# Patient Record
Sex: Female | Born: 1945 | Race: White | Hispanic: No | Marital: Married | Smoking: Never smoker
Health system: Southern US, Community
[De-identification: ages and names within clinical notes are randomized; demographics above are authoritative.]

## PROBLEM LIST (undated history)

## (undated) DIAGNOSIS — E0590 Thyrotoxicosis, unspecified without thyrotoxic crisis or storm: Secondary | ICD-10-CM

## (undated) DIAGNOSIS — Z8719 Personal history of other diseases of the digestive system: Secondary | ICD-10-CM

## (undated) DIAGNOSIS — F419 Anxiety disorder, unspecified: Secondary | ICD-10-CM

## (undated) DIAGNOSIS — I10 Essential (primary) hypertension: Secondary | ICD-10-CM

## (undated) DIAGNOSIS — C50919 Malignant neoplasm of unspecified site of unspecified female breast: Secondary | ICD-10-CM

## (undated) DIAGNOSIS — J40 Bronchitis, not specified as acute or chronic: Secondary | ICD-10-CM

## (undated) DIAGNOSIS — K219 Gastro-esophageal reflux disease without esophagitis: Secondary | ICD-10-CM

## (undated) DIAGNOSIS — J45909 Unspecified asthma, uncomplicated: Secondary | ICD-10-CM

## (undated) HISTORY — DX: Essential (primary) hypertension: I10

## (undated) HISTORY — PX: INCONTINENCE SURGERY: SHX676

## (undated) HISTORY — PX: OTHER SURGICAL HISTORY: SHX169

## (undated) HISTORY — DX: Malignant neoplasm of unspecified site of unspecified female breast: C50.919

## (undated) HISTORY — PX: TONSILLECTOMY: SUR1361

---

## 1994-10-14 HISTORY — PX: BREAST EXCISIONAL BIOPSY: SUR124

## 1998-06-15 ENCOUNTER — Ambulatory Visit (HOSPITAL_BASED_OUTPATIENT_CLINIC_OR_DEPARTMENT_OTHER): Admission: RE | Admit: 1998-06-15 | Discharge: 1998-06-15 | Payer: Self-pay | Admitting: Orthopedic Surgery

## 1999-08-10 ENCOUNTER — Encounter: Admission: RE | Admit: 1999-08-10 | Discharge: 1999-08-10 | Payer: Self-pay | Admitting: Hematology and Oncology

## 1999-08-10 ENCOUNTER — Encounter: Payer: Self-pay | Admitting: Hematology and Oncology

## 1999-09-07 ENCOUNTER — Other Ambulatory Visit: Admission: RE | Admit: 1999-09-07 | Discharge: 1999-09-07 | Payer: Self-pay | Admitting: Internal Medicine

## 2000-03-31 ENCOUNTER — Encounter: Payer: Self-pay | Admitting: Hematology and Oncology

## 2000-03-31 ENCOUNTER — Encounter: Admission: RE | Admit: 2000-03-31 | Discharge: 2000-03-31 | Payer: Self-pay | Admitting: Hematology and Oncology

## 2000-09-01 ENCOUNTER — Other Ambulatory Visit: Admission: RE | Admit: 2000-09-01 | Discharge: 2000-09-01 | Payer: Self-pay | Admitting: Internal Medicine

## 2000-09-01 ENCOUNTER — Encounter: Payer: Self-pay | Admitting: Hematology and Oncology

## 2000-09-01 ENCOUNTER — Encounter: Admission: RE | Admit: 2000-09-01 | Discharge: 2000-09-01 | Payer: Self-pay | Admitting: Hematology and Oncology

## 2000-09-02 ENCOUNTER — Encounter: Admission: RE | Admit: 2000-09-02 | Discharge: 2000-09-02 | Payer: Self-pay | Admitting: Hematology and Oncology

## 2000-09-02 ENCOUNTER — Encounter: Payer: Self-pay | Admitting: Hematology and Oncology

## 2000-09-24 ENCOUNTER — Encounter (INDEPENDENT_AMBULATORY_CARE_PROVIDER_SITE_OTHER): Payer: Self-pay

## 2000-09-24 ENCOUNTER — Other Ambulatory Visit: Admission: RE | Admit: 2000-09-24 | Discharge: 2000-09-24 | Payer: Self-pay | Admitting: *Deleted

## 2001-04-09 ENCOUNTER — Encounter: Admission: RE | Admit: 2001-04-09 | Discharge: 2001-04-09 | Payer: Self-pay | Admitting: Hematology and Oncology

## 2001-04-09 ENCOUNTER — Encounter: Payer: Self-pay | Admitting: Hematology and Oncology

## 2001-09-03 ENCOUNTER — Other Ambulatory Visit: Admission: RE | Admit: 2001-09-03 | Discharge: 2001-09-03 | Payer: Self-pay | Admitting: Internal Medicine

## 2002-04-09 ENCOUNTER — Encounter: Admission: RE | Admit: 2002-04-09 | Discharge: 2002-04-09 | Payer: Self-pay | Admitting: Hematology and Oncology

## 2002-04-09 ENCOUNTER — Encounter: Payer: Self-pay | Admitting: Hematology and Oncology

## 2002-08-20 ENCOUNTER — Other Ambulatory Visit: Admission: RE | Admit: 2002-08-20 | Discharge: 2002-08-20 | Payer: Self-pay | Admitting: Internal Medicine

## 2002-09-07 ENCOUNTER — Encounter: Payer: Self-pay | Admitting: Internal Medicine

## 2002-09-07 ENCOUNTER — Encounter: Admission: RE | Admit: 2002-09-07 | Discharge: 2002-09-07 | Payer: Self-pay | Admitting: Internal Medicine

## 2003-08-30 ENCOUNTER — Encounter: Admission: RE | Admit: 2003-08-30 | Discharge: 2003-08-30 | Payer: Self-pay | Admitting: Oncology

## 2003-09-02 ENCOUNTER — Other Ambulatory Visit: Admission: RE | Admit: 2003-09-02 | Discharge: 2003-09-02 | Payer: Self-pay | Admitting: Internal Medicine

## 2004-09-17 ENCOUNTER — Other Ambulatory Visit: Admission: RE | Admit: 2004-09-17 | Discharge: 2004-09-17 | Payer: Self-pay | Admitting: Internal Medicine

## 2004-09-20 ENCOUNTER — Encounter: Admission: RE | Admit: 2004-09-20 | Discharge: 2004-09-20 | Payer: Self-pay | Admitting: Internal Medicine

## 2005-09-20 ENCOUNTER — Other Ambulatory Visit: Admission: RE | Admit: 2005-09-20 | Discharge: 2005-09-20 | Payer: Self-pay | Admitting: Internal Medicine

## 2005-10-29 ENCOUNTER — Encounter: Admission: RE | Admit: 2005-10-29 | Discharge: 2005-10-29 | Payer: Self-pay | Admitting: Internal Medicine

## 2006-09-19 ENCOUNTER — Other Ambulatory Visit: Admission: RE | Admit: 2006-09-19 | Discharge: 2006-09-19 | Payer: Self-pay | Admitting: Internal Medicine

## 2006-09-26 ENCOUNTER — Encounter: Admission: RE | Admit: 2006-09-26 | Discharge: 2006-09-26 | Payer: Self-pay | Admitting: Internal Medicine

## 2006-11-17 ENCOUNTER — Encounter: Admission: RE | Admit: 2006-11-17 | Discharge: 2006-11-17 | Payer: Self-pay | Admitting: Internal Medicine

## 2006-12-15 ENCOUNTER — Ambulatory Visit (HOSPITAL_BASED_OUTPATIENT_CLINIC_OR_DEPARTMENT_OTHER): Admission: RE | Admit: 2006-12-15 | Discharge: 2006-12-15 | Payer: Self-pay | Admitting: Urology

## 2007-09-24 ENCOUNTER — Other Ambulatory Visit: Admission: RE | Admit: 2007-09-24 | Discharge: 2007-09-24 | Payer: Self-pay | Admitting: Internal Medicine

## 2007-11-30 ENCOUNTER — Encounter: Admission: RE | Admit: 2007-11-30 | Discharge: 2007-11-30 | Payer: Self-pay | Admitting: Internal Medicine

## 2007-12-22 ENCOUNTER — Encounter: Admission: RE | Admit: 2007-12-22 | Discharge: 2007-12-22 | Payer: Self-pay | Admitting: Internal Medicine

## 2008-01-07 ENCOUNTER — Encounter: Admission: RE | Admit: 2008-01-07 | Discharge: 2008-01-07 | Payer: Self-pay | Admitting: Internal Medicine

## 2008-01-13 ENCOUNTER — Encounter: Admission: RE | Admit: 2008-01-13 | Discharge: 2008-01-13 | Payer: Self-pay | Admitting: Internal Medicine

## 2008-02-22 ENCOUNTER — Encounter: Admission: RE | Admit: 2008-02-22 | Discharge: 2008-02-22 | Payer: Self-pay | Admitting: Internal Medicine

## 2008-03-19 ENCOUNTER — Encounter: Admission: RE | Admit: 2008-03-19 | Discharge: 2008-03-19 | Payer: Self-pay | Admitting: Neurosurgery

## 2008-03-30 ENCOUNTER — Encounter: Admission: RE | Admit: 2008-03-30 | Discharge: 2008-03-30 | Payer: Self-pay | Admitting: Neurosurgery

## 2008-07-21 ENCOUNTER — Encounter: Admission: RE | Admit: 2008-07-21 | Discharge: 2008-07-21 | Payer: Self-pay | Admitting: Internal Medicine

## 2008-09-22 ENCOUNTER — Ambulatory Visit: Payer: Self-pay | Admitting: Internal Medicine

## 2008-10-11 ENCOUNTER — Other Ambulatory Visit: Admission: RE | Admit: 2008-10-11 | Discharge: 2008-10-11 | Payer: Self-pay | Admitting: Internal Medicine

## 2008-10-11 ENCOUNTER — Ambulatory Visit: Payer: Self-pay | Admitting: Internal Medicine

## 2008-11-08 ENCOUNTER — Ambulatory Visit: Payer: Self-pay | Admitting: Internal Medicine

## 2008-12-05 ENCOUNTER — Encounter: Admission: RE | Admit: 2008-12-05 | Discharge: 2008-12-05 | Payer: Self-pay | Admitting: Internal Medicine

## 2009-01-05 ENCOUNTER — Ambulatory Visit: Payer: Self-pay | Admitting: Internal Medicine

## 2009-03-16 ENCOUNTER — Ambulatory Visit (HOSPITAL_COMMUNITY): Admission: RE | Admit: 2009-03-16 | Discharge: 2009-03-17 | Payer: Self-pay | Admitting: Neurosurgery

## 2009-03-23 ENCOUNTER — Ambulatory Visit: Payer: Self-pay | Admitting: Internal Medicine

## 2009-10-12 ENCOUNTER — Ambulatory Visit: Payer: Self-pay | Admitting: Internal Medicine

## 2009-10-20 ENCOUNTER — Ambulatory Visit: Payer: Self-pay | Admitting: Internal Medicine

## 2010-01-23 ENCOUNTER — Emergency Department (HOSPITAL_COMMUNITY): Admission: EM | Admit: 2010-01-23 | Discharge: 2010-01-23 | Payer: Self-pay | Admitting: Family Medicine

## 2010-02-15 ENCOUNTER — Emergency Department (HOSPITAL_COMMUNITY): Admission: EM | Admit: 2010-02-15 | Discharge: 2010-02-15 | Payer: Self-pay | Admitting: Family Medicine

## 2010-11-04 ENCOUNTER — Encounter: Payer: Self-pay | Admitting: Internal Medicine

## 2010-12-25 ENCOUNTER — Other Ambulatory Visit: Payer: Self-pay | Admitting: Chiropractic Medicine

## 2010-12-25 ENCOUNTER — Ambulatory Visit
Admission: RE | Admit: 2010-12-25 | Discharge: 2010-12-25 | Disposition: A | Discharge status: Home / Self Care | Payer: Self-pay | Source: Ambulatory Visit | Attending: Chiropractic Medicine | Admitting: Chiropractic Medicine

## 2010-12-25 DIAGNOSIS — M5412 Radiculopathy, cervical region: Secondary | ICD-10-CM

## 2010-12-25 DIAGNOSIS — M545 Low back pain: Secondary | ICD-10-CM

## 2011-01-21 LAB — BASIC METABOLIC PANEL
BUN: 14 mg/dL (ref 6–23)
CO2: 26 mEq/L (ref 19–32)
Calcium: 9.2 mg/dL (ref 8.4–10.5)
Chloride: 106 mEq/L (ref 96–112)
Creatinine, Ser: 0.98 mg/dL (ref 0.4–1.2)
GFR calc Af Amer: >60 mL/min (ref >60)
GFR calc non Af Amer: 58 mL/min — ABNORMAL LOW (ref >60)
Glucose, Bld: 100 mg/dL — ABNORMAL HIGH (ref 70–99)
Potassium: 4.1 mEq/L (ref 3.5–5.1)
Sodium: 140 mEq/L (ref 135–145)

## 2011-01-21 LAB — CBC
MCHC: 34.4 g/dL (ref 30.0–36.0)
Platelets: 258 10*3/uL (ref 150–400)
RDW: 13.6 % (ref 11.5–15.5)

## 2011-01-25 ENCOUNTER — Ambulatory Visit (INDEPENDENT_AMBULATORY_CARE_PROVIDER_SITE_OTHER): Payer: 59 | Admitting: Internal Medicine

## 2011-01-25 DIAGNOSIS — J309 Allergic rhinitis, unspecified: Secondary | ICD-10-CM

## 2011-02-26 NOTE — Op Note (Signed)
NAME:  Tara Rodgers, Tara Rodgers NO.:  [id number]   MEDICAL RECORD NO.:  [id number]          PATIENT TYPE:  OIB   LOCATION:  3599                         FACILITY:  [facility]   PHYSICIAN:  Danae Orleans. Venetia Maxon, M.D.  DATE OF BIRTH:  1946-06-16   DATE OF PROCEDURE:  03/16/2009  DATE OF DISCHARGE:                               OPERATIVE REPORT   PREOPERATIVE DIAGNOSIS:  Left ulnar neuropathy and left carpal tunnel  syndrome.   POSTOPERATIVE DIAGNOSIS:  Left ulnar neuropathy and left carpal tunnel  syndrome.   PROCEDURE:  Left ulnar nerve decompression and left carpal tunnel  release.   SURGEON:  Danae Orleans. Venetia Maxon, MD   ANESTHESIA:  Local anesthetic with laryngeal mask airway general  anesthesia.   COMPLICATIONS:  None.   ESTIMATED BLOOD LOSS:  Minimal.   DISPOSITION:  Recovery.   INDICATIONS:  Tara Rodgers is a 65 year old woman with left arm  weakness and pain with EMG nerve conduction velocity documented as left  ulnar neuropathy as well as left carpal tunnel syndrome.  It was elected  to take her to surgery for left ulnar nerve decompression and left  carpal tunnel release.   PROCEDURE IN DETAIL:  Ms. Beauchesne was brought to the operating room.  Following satisfactory and uncomplicated induction of general anesthesia  with laryngeal mask airway, the patient was placed in a supine position  on the operating table with arms placed on the arm board.  Her left arm  was then prepped and draped with Betadine scrub and paint and sterile  stockinette.  Areas of planned incision were marked and infiltrated with  local lidocaine.  A small curvilinear incision based about the elbow and  the medial surface was then made and carried through subcutaneous fat  overlying the medial condyle.  Using sharp dissection, the ulnar nerve  was encountered at the medial epicondyle and it was decompressed as it  extended more distally between the heads of the flexor carpi ulnaris as  well as  more proximally along the intermuscular septum.  Both areas were  decompressed.  There was a thick band of tissue with deformation of the  shape of the nerve just at the more distal edge of the medial epicondyle  and after cutting this band of tissue, the nerve appeared to be  significantly decompressed.  There was no evidence of subluxation of the  nerve.  The wound was irrigated.  Soft tissues were inspected and found  to be in good repair.  The subcutaneous tissues were approximated with 2-  0 Vicryl sutures and skin edges were approximated with interrupted 3-0  Vicryl subcuticular stitch.  Wound was dressed with benzoin, Steri-  Strips, Telfa gauze, and fluff Kerlix and Kerlix and Kling wrap.  This  was done after the left carpal tunnel was released.  This area was  infiltrated with local lidocaine.  Incision was made from the distal  wrist crease into the volar palm of a distance of about 2-3 cm and  carried sharply through flexor retinaculum with decompression of the  carpal tunnel distally into the palm as well  as more proximally into the  volar forearm.  There did not appear to be residual compression at this  point.  The wound  was irrigated and closed with interrupted 3-0 nylon vertical mattress  sutures.  The wound was dressed with bacitracin, Telfa, Kerlix fluff,  and wrap.  The patient was x-rayed in the operating room and taken to  recovery in stable and satisfactory condition having tolerated the  operation well.  Counts were correct at the end of the case.      Danae Orleans. Venetia Maxon, M.D.  Electronically Signed     JDS/MEDQ  D:  03/16/2009  T:  03/17/2009  Job:  [id number]

## 2011-03-01 NOTE — Op Note (Signed)
NAME:  Tara Rodgers, Tara Rodgers               ACCOUNT NO.:  [id number]   MEDICAL RECORD NO.:  [id number]          PATIENT TYPE:  AMB   LOCATION:  [facility]                         FACILITY:  [facility]   PHYSICIAN:  Mark C. Vernie Ammons, M.D.  DATE OF BIRTH:  [birth year]   DATE OF PROCEDURE:  12/15/2006  DATE OF DISCHARGE:                               OPERATIVE REPORT   PREOPERATIVE DIAGNOSIS:  Stress urinary incontinence.   POSTOPERATIVE DIAGNOSIS:  Stress urinary incontinence.   PROCEDURE:  Suprapubic sling.   SURGEON:  Mark C. Vernie Ammons, M.D.   ANESTHESIA:  General.   BLOOD LOSS:  Minimal.   DRAINS:  None.   SPECIMENS:  None.   COMPLICATIONS:  None.   INDICATIONS:  The patient is a 65 year old white female who, for several  years, has had dribbling type sensation when she urinates.  She requires  pad to be changes three to three times a day.  She was found  urodynamically to have a stable bladder, but findings consistent with  stress incontinence.  She is therefore brought to the OR for sling  procedure and understands the risks, complications, alternatives and  limitations.   DESCRIPTION OF OPERATION:  After informed consent, the patient was  brought to the major OR and placed on the table in supine position for  general anesthesia, then moved to the dorsal lithotomy position.  Her  lower abdomen, genitalia and vagina were sterilely prepped and draped  and a 16-French Foley catheter was then placed in the bladder.  Palpation at the mid urethral level was performed with gentle traction  on the catheter balloon against the bladder neck.  I then infiltrated  the subvaginal mucosa in the midline with 1% lidocaine with epinephrine.  I then allowed time for epinephrine effect and while awaiting that,  palpated and the suprapubic region and with three fingerbreadths apart  on either side just superior to the symphysis pubis, a mark was made and  local anesthetic was injected here as well.   Attention was then directed to the vagina and an incision was then made  at the mid urethral level and dissection was then carried out sharply  beneath the vaginal mucosa to expose the mid urethral left region, first  on right, then left side.  The bladder was then completely drained and  the catheter was removed and a 22-French cystoscope sheath was placed in  the bladder to allow manipulation of the urethra and bladder neck.   Stab incisions were then made in the suprapubic region at the previously  anesthetized locations and the trocar was then passed through the skin  incision, back behind the symphysis pubis and then directed out at the  mid urethral level guided by digital guidance.  This was performed first  on left then right sides.  I then inserted the 70 degree lens in the  cystoscope and inspected the bladder.  It was noted to be free of any  tumor, stones or inflammatory lesions.  Ureteral orifices normal  configuration and position.  There was no evidence of perforation,  injury or foreign body within the  bladder.  I then affixed the sling  material to the trocars and brought this back up through the suprapubic  incisions.  I then reinspected the bladder a second time again noting no  sling material, injury, foreign body or other abnormality.  The  cystoscope was therefore removed and Foley catheter replaced.   I irrigated through the sleeve surrounding the sling material with  antibiotic solution and positioned the sling beneath the mid urethra and  placed the appropriate amount of tension.  I removed the outer sheath  from one side and then the second.  The forceps were placed beneath the  urethra during this maneuver and this allowed the sling tension to be  adjusted so that it laid against the urethra with no tension.  This area  was then copiously irrigated with antibiotic solution.  The vaginal  mucosa was closed with a running locking 2-0 Vicryl suture.  The vagina   was irrigated a final time with antibiotic solution and the Foley  catheter was removed.  Suprapubic sites were irrigated with antibiotic  solution and closed with Dermabond and the patient was awakened and  taken to recovery room in stable satisfactory condition.  She tolerated  procedure well with no intraoperative complications.  Needle, sponge and  instrument counts were correct at the end of the operation.   The patient will be given a prescription for Vicodin HP #24 and Cipro  500 mg to take b.i.d. for 5 days.  She will follow-up in my office in 1  week and be discharged from the recovery room once she voids.      Mark C. Vernie Ammons, M.D.  Electronically Signed     MCO/MEDQ  D:  12/15/2006  T:  12/15/2006  Job:  [id number]

## 2011-06-21 ENCOUNTER — Other Ambulatory Visit: Payer: Self-pay | Admitting: Internal Medicine

## 2011-08-20 ENCOUNTER — Other Ambulatory Visit: Payer: Self-pay

## 2011-08-20 MED ORDER — CYCLOBENZAPRINE HCL 10 MG PO TABS: 10.0000 mg | ORAL_TABLET | Freq: Three times a day (TID) | ORAL | Status: AC | PRN

## 2012-01-04 ENCOUNTER — Other Ambulatory Visit: Payer: Self-pay | Admitting: Internal Medicine

## 2012-04-08 ENCOUNTER — Other Ambulatory Visit: Payer: Self-pay | Admitting: Internal Medicine

## 2012-04-08 NOTE — Telephone Encounter (Signed)
See when pt needs to come in. i think insurance was an issue last year and we agreed on one year visits.

## 2012-04-08 NOTE — Telephone Encounter (Signed)
Scheduled for CPE on 05/18/2012. Will have [facility] and supp insurance then

## 2012-05-14 ENCOUNTER — Other Ambulatory Visit: Payer: Medicare Other | Admitting: Internal Medicine

## 2012-05-14 DIAGNOSIS — E039 Hypothyroidism, unspecified: Secondary | ICD-10-CM

## 2012-05-14 DIAGNOSIS — I10 Essential (primary) hypertension: Secondary | ICD-10-CM

## 2012-05-14 LAB — COMPREHENSIVE METABOLIC PANEL
ALT: 23 U/L (ref 0–35)
AST: 18 U/L (ref 0–37)
CO2: 23 mEq/L (ref 19–32)
Calcium: 8.8 mg/dL (ref 8.4–10.5)
Chloride: 107 mEq/L (ref 96–112)
Sodium: 141 mEq/L (ref 135–145)
Total Bilirubin: 0.3 mg/dL (ref 0.3–1.2)
Total Protein: 6.4 g/dL (ref 6.0–8.3)

## 2012-05-14 LAB — CBC WITH DIFFERENTIAL/PLATELET
Lymphocytes Relative: 28 % (ref 12–46)
Lymphs Abs: 2.1 10*3/uL (ref 0.7–4.0)
Neutrophils Relative %: 61 % (ref 43–77)
Platelets: 289 10*3/uL (ref 150–400)
RBC: 4.23 MIL/uL (ref 3.87–5.11)
WBC: 7.5 10*3/uL (ref 4.0–10.5)

## 2012-05-14 LAB — LIPID PANEL
Cholesterol: 134 mg/dL (ref 0–200)
VLDL: 30 mg/dL (ref 0–40)

## 2012-05-14 LAB — TSH: TSH: 3.725 u[IU]/mL (ref 0.350–4.500)

## 2012-05-15 LAB — VITAMIN D 25 HYDROXY (VIT D DEFICIENCY, FRACTURES): Vit D, 25-Hydroxy: 41 ng/mL (ref 30–89)

## 2012-05-18 ENCOUNTER — Other Ambulatory Visit (HOSPITAL_COMMUNITY)
Admission: RE | Admit: 2012-05-18 | Discharge: 2012-05-18 | Disposition: A | Discharge status: Home / Self Care | Payer: Medicare Other | Source: Ambulatory Visit | Attending: Internal Medicine | Admitting: Internal Medicine

## 2012-05-18 ENCOUNTER — Ambulatory Visit (INDEPENDENT_AMBULATORY_CARE_PROVIDER_SITE_OTHER): Payer: Medicare Other | Admitting: Internal Medicine

## 2012-05-18 ENCOUNTER — Encounter: Payer: Self-pay | Admitting: Internal Medicine

## 2012-05-18 VITALS — BP 124/78 | HR 100 | Ht 58.5 in | Wt 145.0 lb

## 2012-05-18 DIAGNOSIS — Z0000 Encounter for general adult medical examination without abnormal findings: Secondary | ICD-10-CM

## 2012-05-18 DIAGNOSIS — Z124 Encounter for screening for malignant neoplasm of cervix: Secondary | ICD-10-CM | POA: Insufficient documentation

## 2012-05-18 DIAGNOSIS — Z139 Encounter for screening, unspecified: Secondary | ICD-10-CM

## 2012-05-18 DIAGNOSIS — F411 Generalized anxiety disorder: Secondary | ICD-10-CM

## 2012-05-18 DIAGNOSIS — Z23 Encounter for immunization: Secondary | ICD-10-CM

## 2012-05-18 DIAGNOSIS — J309 Allergic rhinitis, unspecified: Secondary | ICD-10-CM

## 2012-05-18 DIAGNOSIS — K219 Gastro-esophageal reflux disease without esophagitis: Secondary | ICD-10-CM

## 2012-05-18 DIAGNOSIS — I10 Essential (primary) hypertension: Secondary | ICD-10-CM

## 2012-05-18 DIAGNOSIS — E039 Hypothyroidism, unspecified: Secondary | ICD-10-CM

## 2012-05-18 DIAGNOSIS — Z853 Personal history of malignant neoplasm of breast: Secondary | ICD-10-CM

## 2012-05-18 DIAGNOSIS — E785 Hyperlipidemia, unspecified: Secondary | ICD-10-CM

## 2012-05-18 DIAGNOSIS — M810 Age-related osteoporosis without current pathological fracture: Secondary | ICD-10-CM

## 2012-05-18 DIAGNOSIS — F419 Anxiety disorder, unspecified: Secondary | ICD-10-CM

## 2012-05-18 DIAGNOSIS — J45909 Unspecified asthma, uncomplicated: Secondary | ICD-10-CM

## 2012-05-18 MED ORDER — PNEUMOCOCCAL VAC POLYVALENT 25 MCG/0.5ML IJ INJ: 0.5000 mL | INJECTION | INTRAMUSCULAR | Status: AC

## 2012-05-18 MED ORDER — TETANUS-DIPHTH-ACELL PERTUSSIS 5-2.5-18.5 LF-MCG/0.5 IM SUSP: 0.5000 mL | Freq: Once | INTRAMUSCULAR | Status: DC

## 2012-05-18 NOTE — Progress Notes (Signed)
Subjective:    Patient ID: Tara Rodgers, female    DOB: 1946-01-22, 66 y.o.   MRN: [id number]  HPI Not seen since April 2012. Hx asthma, allergic rhinitis, depression, anxiety, hyperlipidemia, Hx breast cancer,hypothyroidism,hypertension,osteoporosis,multinodular goiter,stress urinary incontinence, osteoarthritis of hands., GE reflux. Has not had health insurance for a while.  Is now on Medicare.  For health maintenance and evaluation of medical problems.  History of right carpal tunnel syndrome 22-Mar-1994  Surgery for left carpal tunnel syndrome and 2009/03/22  Bilateral tubal ligation 1986  Left tennis elbow release 1990  Right breast lumpectomy with axillary node dissection 1996  Foot and ankle surgery 22-Mar-1998  Suprapubic sling March 2008  Pneumovax December 2010  Has had trouble affording statin medication. Does better with Crestor day and generic Lipitor because of muscle aches.  She retired from [employer]. Was a divorcee but remarried a few years ago. Current husband as a Curator for [employer] on [address] [employer].  Does not smoke occasional alcohol consumption.  Social history: 2 adult children a son and a daughter  Family history: Father died in 22-Mar-1997 of complications of COPD at age 43 with history of coronary artery disease. One half sister with thyroid issues.  Took estrogen replacement in the early 1990s.  Remote history of PVCs noted in 03/22/1990 and is  treated with verapamil which she  takes for hypertension.  Sigmoidoscopy done in March 22, 1997 by Dr. Dorena Cookey. Never had colonoscopy.  History of perennial allergic rhinitis and vasomotor rhinitis. Was last seen by followed by Dr. Foxhome Callas for Allergies in March 22, 2010. In 03/22/2000, skin s were positive for weeds, black walnut, dog, cat, house dust, dust mites, cockroach and mold.         Review of Systems  Constitutional: Positive for fatigue.  HENT: Positive for sneezing and postnasal drip.   Eyes: Negative.   Respiratory:  Negative.   Cardiovascular: Negative.   Gastrointestinal:       Reflux  Genitourinary: Negative.   Musculoskeletal: Positive for arthralgias.  Neurological: Negative.   Hematological: Negative.   Psychiatric/Behavioral:       Anxiety longstanding       Objective:   Physical Exam  Vitals reviewed. Constitutional: She is oriented to person, place, and time. She appears well-developed and well-nourished. No distress.  HENT:  Head: Normocephalic and atraumatic.  Right Ear: External ear normal.  Left Ear: External ear normal.  Mouth/Throat: No oropharyngeal exudate.  Eyes: Conjunctivae normal and EOM are normal. Pupils are equal, round, and reactive to light. Right eye exhibits no discharge. No scleral icterus.  Neck: Neck supple. No JVD present. No thyromegaly present.  Cardiovascular: Normal rate, regular rhythm and intact distal pulses.   No murmur heard. Pulmonary/Chest: Effort normal and breath sounds normal. She has no wheezes. She has no rales.  Abdominal: Soft. Bowel sounds are normal. She exhibits no distension and no mass. There is no tenderness. There is no rebound and no guarding.  Genitourinary: Vagina normal.       Pap taken- no masses  Musculoskeletal: She exhibits no edema.  Lymphadenopathy:    She has no cervical adenopathy.  Neurological: She is alert and oriented to person, place, and time. She has normal reflexes. No cranial nerve deficit. She exhibits normal muscle tone. Coordination normal.  Skin: Skin is warm and dry. No rash noted. She is not diaphoretic. No erythema. No pallor.  Psychiatric: She has a normal mood and affect. Her behavior is normal. Judgment and thought content normal.  Assessment & Plan:  Hypertension  Fibrocystic breast disease  History of breast cancer 1996 with right lumpectomy with no evidence of recurrence  Asthma  Allergic rhinitis  Anxiety  GE reflux  Vasomotor rhinitis  Hypothyroidism  Multinodular  border  Osteoporosis diagnosed in 2001-recommend calcium and vitamin D therapy  Hyperlipidemia  Plan: Patient should return in 6 months. Consider colonoscopy. Note given to have Zostavax vaccine done through pharmacy. Recommend annual mammogram. Recommend bone density study. Subjective:   Patient presents for Medicare Annual/Subsequent preventive examination.   Review Past Medical/Family/Social: see EPIC   Risk Factors  Current exercise habits: walk some Dietary issues discussed: low fat low carb  Cardiac risk factors: Obesity (BMI >= 30 kg/m2). Hyperlipidemia  Depression Screen  (Note: if answer to either of the following is "Yes", a more complete depression screening is indicated)  Over the past two weeks, have you felt down, depressed or hopeless? Yes  Some days Over the past two weeks, have you felt little interest or pleasure in doing things? Yes Some days Have you lost interest or pleasure in daily life? No Do you often feel hopeless? No Do you cry easily over simple problems? No   Activities of Daily Living  In your present state of health, do you have any difficulty performing the following activities?:  Driving? No  Managing money? No  Feeding yourself? No  Getting from bed to chair? No  Climbing a flight of stairs? No- physically deconditioned Preparing food and eating?: No  Bathing or showering? No  Getting dressed: No  Getting to the toilet? No  Using the toilet:No  Moving around from place to place: No  In the past year have you fallen or had a near fall?:No  Are you sexually active? occasionally Do you have more than one partner? No   Hearing Difficulties: No  Do you often ask people to speak up or repeat themselves? No  Do you experience ringing or noises in your ears? Yes  Do you have difficulty understanding soft or whispered voices? No  Do you feel that you have a problem with memory? Yes  Do you often misplace items? No  Do you feel safe at home?  Yes   Cognitive Testing  Alert? Yes Normal Appearance?Yes  Oriented to person? Yes Place? Yes  Time? Yes  Recall of three objects? Yes  Can perform simple calculations? Yes  Displays appropriate judgment?Yes  Can read the correct time from a watch face?Yes   List the Names of Other Physician/Practitioners you currently use: None at present time   Indicate any recent Medical Services you may have received from other than [facility] providers in the past year (date may be approximate).   Screening Tests / Date Colonoscopy   -  To be scheduled                Zostavax - order given Mammogram -Feb 2010 Influenza Vaccine-missed last year Tetanus/tdap to be scheduled   Objective:     Body mass index: see Epic BP     Pulse     Ht       Wt       BMI      SpO2   See EPIC for Vital signs    General appearance: Appears stated age and mildly obese  Head: Normocephalic, without obvious abnormality, atraumatic  Eyes: conj clear, EOMi PEERLA  Ears: normal TM's and external ear canals both ears  Nose: Nares normal. Septum midline. Mucosa normal.  No drainage or sinus tenderness.  Throat: lips, mucosa, and tongue normal; teeth and gums normal  Neck: no adenopathy, no carotid bruit, no JVD, supple, symmetrical, trachea midline and thyroid not enlarged, symmetric, no tenderness/mass/nodules  No CVA tenderness.  Lungs: clear to auscultation bilaterally  Breasts: normal appearance, no masses or tenderness,  Heart: regular rate and rhythm, S1, S2 normal, no murmur, click, rub or gallop  Abdomen: soft, non-tender; bowel sounds normal; no masses, no organomegaly  Musculoskeletal: ROM normal in all joints, no crepitus, no deformity, Normal muscle strengthen. Back  is symmetric, no curvature. Skin: Skin color, texture, turgor normal. No rashes or lesions  Lymph nodes: Cervical, supraclavicular, and axillary nodes normal.  Neurologic: CN 2 -12 Normal, Normal symmetric reflexes. Normal coordination  and gait  Psych: Alert & Oriented x 3, Mood appear stable.    Assessment:    Annual wellness medicare exam   Plan:    During the course of the visit the patient was educated and counseled about appropriate screening and preventive services including:  Screening mammography  Colorectal cancer screening  Shingles vaccine. Prescription given to that she can get the vaccine at the pharmacy or Medicare part D.  Screen + for depression. PHQ- 9 score of 12 (moderate depression). We discussed the options of counseling versus possibly a medication. I encouraged her strongly think about the counseling. She is going through some medical problems currently and her husband is as well Mrs. been very stressful for her. She says she will think about it. She does have Xanax to use as needed. Though she may benefit from an SSRI for her more depressive type symptoms but she wants to hold off at this time.  I aksed her to please have her cardioloist send records since we have none on file.  Diet review for nutrition referral? Yes ____ Not Indicated __x__Not applicable  Patient Instructions (the written plan) was given to the patient.  Medicare Attestation  I have personally reviewed:  The patient's medical and social history  Their use of alcohol, tobacco or illicit drugs  Their current medications and supplements  The patient's functional ability including ADLs,fall risks, home safety risks, cognitive, and hearing and visual impairment  Diet and physical activities  Evidence for depression or mood disorders  The patient's weight, height, BMI, and visual acuity have been recorded in the chart. I have made referrals, counseling, and provided education to the patient based on review of the above and I have provided the patient with a written personalized care plan for preventive services.

## 2012-06-08 ENCOUNTER — Other Ambulatory Visit: Payer: Self-pay | Admitting: Internal Medicine

## 2012-06-08 DIAGNOSIS — Z1231 Encounter for screening mammogram for malignant neoplasm of breast: Secondary | ICD-10-CM

## 2012-06-09 ENCOUNTER — Ambulatory Visit
Admission: RE | Admit: 2012-06-09 | Discharge: 2012-06-09 | Disposition: A | Discharge status: Home / Self Care | Payer: Medicare Other | Source: Ambulatory Visit | Attending: Internal Medicine | Admitting: Internal Medicine

## 2012-06-09 ENCOUNTER — Other Ambulatory Visit: Payer: Self-pay | Admitting: Internal Medicine

## 2012-06-09 DIAGNOSIS — Z9889 Other specified postprocedural states: Secondary | ICD-10-CM

## 2012-06-09 DIAGNOSIS — Z1231 Encounter for screening mammogram for malignant neoplasm of breast: Secondary | ICD-10-CM

## 2012-06-09 DIAGNOSIS — Z853 Personal history of malignant neoplasm of breast: Secondary | ICD-10-CM

## 2012-06-18 ENCOUNTER — Other Ambulatory Visit: Payer: Medicare Other

## 2012-06-19 ENCOUNTER — Other Ambulatory Visit: Payer: Self-pay | Admitting: Internal Medicine

## 2012-06-22 ENCOUNTER — Encounter: Payer: Self-pay | Admitting: Internal Medicine

## 2012-09-19 ENCOUNTER — Encounter: Payer: Self-pay | Admitting: Internal Medicine

## 2012-09-19 DIAGNOSIS — Z853 Personal history of malignant neoplasm of breast: Secondary | ICD-10-CM | POA: Insufficient documentation

## 2012-09-19 DIAGNOSIS — I10 Essential (primary) hypertension: Secondary | ICD-10-CM | POA: Insufficient documentation

## 2012-09-19 DIAGNOSIS — J309 Allergic rhinitis, unspecified: Secondary | ICD-10-CM | POA: Insufficient documentation

## 2012-09-19 DIAGNOSIS — M810 Age-related osteoporosis without current pathological fracture: Secondary | ICD-10-CM | POA: Insufficient documentation

## 2012-09-19 DIAGNOSIS — F419 Anxiety disorder, unspecified: Secondary | ICD-10-CM | POA: Insufficient documentation

## 2012-09-19 DIAGNOSIS — K219 Gastro-esophageal reflux disease without esophagitis: Secondary | ICD-10-CM | POA: Insufficient documentation

## 2012-09-19 DIAGNOSIS — E785 Hyperlipidemia, unspecified: Secondary | ICD-10-CM | POA: Insufficient documentation

## 2012-09-19 DIAGNOSIS — E039 Hypothyroidism, unspecified: Secondary | ICD-10-CM | POA: Insufficient documentation

## 2012-09-19 DIAGNOSIS — J45909 Unspecified asthma, uncomplicated: Secondary | ICD-10-CM | POA: Insufficient documentation

## 2012-09-19 NOTE — Patient Instructions (Addendum)
Continue same medications and return in 6 months for office visit lipid panel liver functions. Consider colonoscopy.

## 2012-09-25 ENCOUNTER — Other Ambulatory Visit: Payer: Self-pay

## 2012-09-25 MED ORDER — CYCLOBENZAPRINE HCL 10 MG PO TABS: 10.0000 mg | ORAL_TABLET | Freq: Two times a day (BID) | ORAL | Status: DC | PRN

## 2012-11-13 ENCOUNTER — Ambulatory Visit (INDEPENDENT_AMBULATORY_CARE_PROVIDER_SITE_OTHER): Payer: Medicare Other | Admitting: Internal Medicine

## 2012-11-13 ENCOUNTER — Encounter: Payer: Self-pay | Admitting: Internal Medicine

## 2012-11-13 VITALS — BP 142/90 | HR 124 | Temp 101.1°F | Wt 144.0 lb

## 2012-11-13 DIAGNOSIS — J40 Bronchitis, not specified as acute or chronic: Secondary | ICD-10-CM

## 2012-11-13 DIAGNOSIS — J111 Influenza due to unidentified influenza virus with other respiratory manifestations: Secondary | ICD-10-CM

## 2012-11-13 NOTE — Patient Instructions (Addendum)
Take Levaquin 500 milligrams daily for 7 days. Out of work for one week. Hycodan 8 ounces 1 teaspoon by mouth every 8 hours when necessary cough. Ventolin inhaler 2 sprays by mouth 4 times daily

## 2012-11-13 NOTE — Progress Notes (Signed)
  Subjective:    Patient ID: Tara Rodgers, female    DOB: 09-19-1946, 67 y.o.   MRN: [id number]  HPI 3 day history of URI symptoms with cough and congestion. Cough is basically nonproductive. Husband has had similar illness. Did not take influenza immunization. Has had fever. No shaking chills. Fatigue and myalgias. She is now back to work through a [employer]. No sore throat. No ear pain. Pulse oximetry is 96% on room air. History of allergic rhinitis, hypertension, hyperlipidemia, asthma, osteoporosis, GE reflux, anxiety, hypothyroidism and history of breast cancer in remission.           Review of Systems     Objective:   Physical Exam Skin is warm and dry. HEENT exam: Pharynx is slightly injected without exudate. Left TM slightly full but not red. Right TM clear. Neck is supple. Has anterior cervical nodes bilaterally. Neck is supple. Chest clear to auscultation without rales or wheezing. Has congested cough.        Assessment & Plan:  Bronchitis  History of asthma  Possible influenza  Plan: Ventolin inhaler 2 sprays by mouth 4 times a day. Out of work for one week. Levaquin 500 milligrams daily for 7 days. Hycodan syrup 8 ounces 1 teaspoon by mouth every 8 hours when necessary cough.

## 2012-11-16 ENCOUNTER — Other Ambulatory Visit: Payer: Medicare Other | Admitting: Internal Medicine

## 2012-11-19 ENCOUNTER — Other Ambulatory Visit: Payer: Medicare Other | Admitting: Internal Medicine

## 2012-11-20 ENCOUNTER — Other Ambulatory Visit: Payer: Medicare Other | Admitting: Internal Medicine

## 2012-11-20 ENCOUNTER — Ambulatory Visit: Payer: Medicare Other | Admitting: Internal Medicine

## 2012-11-20 DIAGNOSIS — Z79899 Other long term (current) drug therapy: Secondary | ICD-10-CM

## 2012-11-20 DIAGNOSIS — E559 Vitamin D deficiency, unspecified: Secondary | ICD-10-CM

## 2012-11-20 DIAGNOSIS — E785 Hyperlipidemia, unspecified: Secondary | ICD-10-CM

## 2012-11-20 DIAGNOSIS — E039 Hypothyroidism, unspecified: Secondary | ICD-10-CM

## 2012-11-20 LAB — HEPATIC FUNCTION PANEL
ALT: 24 U/L (ref 0–35)
AST: 16 U/L (ref 0–37)
Alkaline Phosphatase: 83 U/L (ref 39–117)
Bilirubin, Direct: 0.1 mg/dL (ref 0.0–0.3)

## 2012-11-20 LAB — TSH: TSH: 5.407 u[IU]/mL — ABNORMAL HIGH (ref 0.350–4.500)

## 2012-11-20 LAB — LIPID PANEL
Cholesterol: 201 mg/dL — ABNORMAL HIGH (ref 0–200)
Total CHOL/HDL Ratio: 5.2 Ratio

## 2012-12-18 ENCOUNTER — Ambulatory Visit: Payer: Medicare Other | Admitting: Internal Medicine

## 2012-12-24 ENCOUNTER — Encounter: Payer: Self-pay | Admitting: Internal Medicine

## 2012-12-24 ENCOUNTER — Ambulatory Visit: Payer: Medicare Other | Admitting: Internal Medicine

## 2012-12-24 VITALS — BP 120/82 | HR 92 | Temp 98.7°F | Wt 137.5 lb

## 2012-12-24 DIAGNOSIS — E785 Hyperlipidemia, unspecified: Secondary | ICD-10-CM

## 2012-12-24 DIAGNOSIS — F411 Generalized anxiety disorder: Secondary | ICD-10-CM

## 2012-12-24 DIAGNOSIS — E039 Hypothyroidism, unspecified: Secondary | ICD-10-CM

## 2012-12-24 DIAGNOSIS — Z8639 Personal history of other endocrine, nutritional and metabolic disease: Secondary | ICD-10-CM

## 2012-12-24 DIAGNOSIS — R6889 Other general symptoms and signs: Secondary | ICD-10-CM

## 2012-12-24 DIAGNOSIS — R7989 Other specified abnormal findings of blood chemistry: Secondary | ICD-10-CM

## 2012-12-24 NOTE — Patient Instructions (Addendum)
Await TSH results. Take Crestor with compliance. Return in a few weeks for fasting lipid panel only. Samples of Crestor given today.

## 2012-12-24 NOTE — Progress Notes (Signed)
  Subjective:    Patient ID: Tara Rodgers, female    DOB: 06/19/1946, 67 y.o.   MRN: [id number]  HPI patient here today to followup on hypothyroidism and hyperlipidemia. It seems that Crestor caused her about $42 a month. Unfortunately when her lipid panel was checked recently she was not taking Crestor. That is why it looked as if hyperlipidemia was not well controlled. She was off of her medication. Also her recent TSH was elevated at 5.407 and previously was slightly over 3 the last time it was checked. She denies significant noncompliance with Synthroid. Says she may have missed a dose or 2. Husband has been sick recently with pneumonia. She's been anxious about this. The Xanax to be refilled.      Review of Systems     Objective:   Physical Exam No thyromegaly. Chest clear to auscultation. Cardiac exam regular rate and rhythm. Skin warm and dry. Nodes none. Extremities without edema.        Assessment & Plan:  Hypothyroidism-repeat TSH on current dose of Synthroid 0.075 mg daily  Hyperlipidemia-samples of Crestor 20 mg given today plus savings card  Anxiety-okay to refill Xanax for 6 months  Plan: Patient is to return in several weeks to have fasting lipid panel without liver functions while being compliant with Crestor. TSH drawn today is pending on current dose of Synthroid 0.075 mg daily  25 minutes spent with patient.

## 2012-12-25 ENCOUNTER — Other Ambulatory Visit: Payer: Self-pay | Admitting: Internal Medicine

## 2013-02-04 ENCOUNTER — Other Ambulatory Visit: Payer: Self-pay | Admitting: Internal Medicine

## 2013-03-22 ENCOUNTER — Other Ambulatory Visit: Payer: Self-pay | Admitting: Internal Medicine

## 2013-06-15 ENCOUNTER — Other Ambulatory Visit: Payer: Medicare Other | Admitting: Internal Medicine

## 2013-06-15 DIAGNOSIS — E785 Hyperlipidemia, unspecified: Secondary | ICD-10-CM

## 2013-06-15 DIAGNOSIS — I10 Essential (primary) hypertension: Secondary | ICD-10-CM

## 2013-06-15 DIAGNOSIS — M810 Age-related osteoporosis without current pathological fracture: Secondary | ICD-10-CM

## 2013-06-15 DIAGNOSIS — E039 Hypothyroidism, unspecified: Secondary | ICD-10-CM

## 2013-06-15 DIAGNOSIS — Z130 Encounter for screening for diseases of the blood and blood-forming organs and certain disorders involving the immune mechanism: Secondary | ICD-10-CM

## 2013-06-15 LAB — LIPID PANEL
Cholesterol: 119 mg/dL (ref 0–200)
HDL: 54 mg/dL (ref >39)
LDL Cholesterol: 44 mg/dL (ref 0–99)
Triglycerides: 103 mg/dL (ref <150)
VLDL: 21 mg/dL (ref 0–40)

## 2013-06-15 LAB — CBC WITH DIFFERENTIAL/PLATELET
Basophils Relative: 1 % (ref 0–1)
Eosinophils Absolute: 0.2 10*3/uL (ref 0.0–0.7)
Eosinophils Relative: 3 % (ref 0–5)
HCT: 36.9 % (ref 36.0–46.0)
Hemoglobin: 12.3 g/dL (ref 12.0–15.0)
Lymphs Abs: 2 10*3/uL (ref 0.7–4.0)
MCH: 28.4 pg (ref 26.0–34.0)
MCHC: 33.3 g/dL (ref 30.0–36.0)
MCV: 85.2 fL (ref 78.0–100.0)
Monocytes Absolute: 0.6 10*3/uL (ref 0.1–1.0)
Monocytes Relative: 8 % (ref 3–12)
RBC: 4.33 MIL/uL (ref 3.87–5.11)

## 2013-06-15 LAB — COMPREHENSIVE METABOLIC PANEL
Alkaline Phosphatase: 61 U/L (ref 39–117)
BUN: 20 mg/dL (ref 6–23)
CO2: 27 mEq/L (ref 19–32)
Glucose, Bld: 83 mg/dL (ref 70–99)
Total Bilirubin: 0.6 mg/dL (ref 0.3–1.2)

## 2013-06-17 ENCOUNTER — Encounter: Payer: Medicare Other | Admitting: Internal Medicine

## 2013-06-21 ENCOUNTER — Other Ambulatory Visit: Payer: Self-pay | Admitting: Internal Medicine

## 2013-06-28 ENCOUNTER — Encounter: Payer: Self-pay | Admitting: Internal Medicine

## 2013-06-28 ENCOUNTER — Ambulatory Visit (INDEPENDENT_AMBULATORY_CARE_PROVIDER_SITE_OTHER): Payer: Medicare Other | Admitting: Internal Medicine

## 2013-06-28 VITALS — BP 118/78 | HR 92 | Temp 98.7°F | Ht 59.25 in | Wt 130.0 lb

## 2013-06-28 DIAGNOSIS — Z853 Personal history of malignant neoplasm of breast: Secondary | ICD-10-CM

## 2013-06-28 DIAGNOSIS — M810 Age-related osteoporosis without current pathological fracture: Secondary | ICD-10-CM

## 2013-06-28 DIAGNOSIS — K219 Gastro-esophageal reflux disease without esophagitis: Secondary | ICD-10-CM

## 2013-06-28 DIAGNOSIS — E785 Hyperlipidemia, unspecified: Secondary | ICD-10-CM

## 2013-06-28 DIAGNOSIS — I10 Essential (primary) hypertension: Secondary | ICD-10-CM

## 2013-06-28 DIAGNOSIS — E039 Hypothyroidism, unspecified: Secondary | ICD-10-CM

## 2013-06-28 DIAGNOSIS — Z0000 Encounter for general adult medical examination without abnormal findings: Secondary | ICD-10-CM

## 2013-06-28 DIAGNOSIS — IMO0001 Reserved for inherently not codable concepts without codable children: Secondary | ICD-10-CM

## 2013-06-28 DIAGNOSIS — M7918 Myalgia, other site: Secondary | ICD-10-CM

## 2013-06-28 DIAGNOSIS — J309 Allergic rhinitis, unspecified: Secondary | ICD-10-CM

## 2013-06-28 DIAGNOSIS — F411 Generalized anxiety disorder: Secondary | ICD-10-CM

## 2013-06-28 LAB — POCT URINALYSIS DIPSTICK
Bilirubin, UA: NEGATIVE
Ketones, UA: NEGATIVE
Protein, UA: NEGATIVE
Spec Grav, UA: 1.015
pH, UA: 6

## 2013-06-28 NOTE — Progress Notes (Signed)
Subjective:    Patient ID: TERSA Rodgers, female    DOB: [birth year], 67 y.o.   MRN: [id number]  HPI  67 year old Female for health maintenance and evaluation of medical problems. History of Hypothyroidism, HTN, Hyperlipidemia, hx of breast cancer, anxiety, GE reflux, allergic rhinitis, osteoporosis, multinodular border, stress urinary incontinence, osteoarthritis of hands. Patient had welcome to Medicare exam 2013.  Right carpal tunnel syndrome 1995, surgery for left carpal tunnel syndrome in 2010  Bilateral tubal ligation 1986, left tennis elbow release 1990  Right breast lumpectomy with axillary node dissection 1996. Took estrogen replacement in the early 1990s.  Foot and ankle surgery 1999  Suprapubic sling March 2008  History of perennial allergic rhinitis and vasomotor rhinitis. Was last seen by Dr. Nightmute Callas for allergies in 2011. In 2001 skin test were positive for weeds, black walnut, dog, cat, house dust, dust mites, cockroach and mold  Remote history of PVCs noted in 1991 treated with verapamil which she also takes for hypertension.  Pneumovax given December 2010, sigmoidoscopy done in 1998 by Dr. Madilyn Fireman. Never had colonoscopy.  Does not smoke or consume alcohol. 2 adult children a son and a daughter. Has been married twice. Current husband as a Curator. She is retired from [employer].  Family history: Father died in 87 of complications of COPD at age 67 with history of coronary artery disease. One half sister with thyroid issues.    Review of Systems  Constitutional: Negative.   HENT: Positive for tinnitus.   Eyes: Negative.   Respiratory: Negative.   Cardiovascular: Negative.   Gastrointestinal:       GERD  Endocrine:       Hypothyroidism  Genitourinary: Negative.   Allergic/Immunologic: Positive for environmental allergies.  Hematological: Negative.   Psychiatric/Behavioral:       History of anxiety       Objective:   Physical Exam  Vitals  reviewed. Constitutional: She is oriented to person, place, and time. She appears well-developed and well-nourished. No distress.  HENT:  Head: Normocephalic and atraumatic.  Right Ear: External ear normal.  Left Ear: External ear normal.  Mouth/Throat: Oropharynx is clear and moist. No oropharyngeal exudate.  Eyes: Conjunctivae and EOM are normal. Pupils are equal, round, and reactive to light. Right eye exhibits no discharge. Left eye exhibits no discharge. No scleral icterus.  Neck: Normal range of motion. Neck supple. No JVD present. No thyromegaly present.  Cardiovascular: Normal rate, regular rhythm, normal heart sounds and intact distal pulses.   No murmur heard. Pulmonary/Chest: Effort normal and breath sounds normal.  Breasts normal female  Abdominal: Soft. Bowel sounds are normal. She exhibits no distension and no mass. There is no rebound and no guarding.  Genitourinary:  Pap done 2013. Bimanual normal  Musculoskeletal: Normal range of motion. She exhibits no edema.  Lymphadenopathy:    She has no cervical adenopathy.  Neurological: She is alert and oriented to person, place, and time. She has normal reflexes. No cranial nerve deficit.  Skin: Skin is warm and dry. No rash noted. She is not diaphoretic.  Psychiatric: She has a normal mood and affect. Her behavior is normal. Judgment and thought content normal.          Assessment & Plan:  Hypertension-stable on current regimen of verapamil  Hyperlipidemia-treated with Crestor. Samples provided  History of anxiety-refill Xanax  Hypothyroidism treated with Synthroid 0.075 mg daily  History of breast cancer  Osteoporosis  Allergic rhinitis-takes Allegra and uses when necessary albuterol inhaler  GE reflux treated with generic Protonix  Musculoskeletal pain-takes Flexeril on occasion  Plan: Continue same medications and return in 6 months. Order given for mammogram.   Subjective:   Patient presents for  Medicare Annual/Subsequent preventive examination.   Review Past Medical/Family/Social: see EPIC   Risk Factors  Current exercise habits: [employer]-- 3 times a week treadmill or exercise bicycle Dietary issues discussed: low fat low carb  Cardiac risk factors:HTN, Hyperlipidemia  Depression Screen  (Note: if answer to either of the following is "Yes", a more complete depression screening is indicated)   Over the past two weeks, have you felt down, depressed or hopeless? No  Over the past two weeks, have you felt little interest or pleasure in doing things? No Have you lost interest or pleasure in daily life? No Do you often feel hopeless? No Do you cry easily over simple problems? No   Activities of Daily Living  In your present state of health, do you have any difficulty performing the following activities?:   Driving? No  Managing money? No  Feeding yourself? No  Getting from bed to chair? No  Climbing a flight of stairs? No  Preparing food and eating?: No  Bathing or showering? No  Getting dressed: No  Getting to the toilet? No  Using the toilet:No  Moving around from place to place: No  In the past year have you fallen or had a near fall?:No  Are you sexually active? sometimes Do you have more than one partner? No   Hearing Difficulties: No  Do you often ask people to speak up or repeat themselves? No  Do you experience ringing or noises in your ears? No  Do you have difficulty understanding soft or whispered voices? No  Do you feel that you have a problem with memory? No Do you often misplace items? No    Home Safety:  Do you have a smoke alarm at your residence? Yes Do you have grab bars in the bathroom? no Do you have throw rugs in your house? Getting rid of them   Cognitive Testing  Alert? Yes Normal Appearance?Yes  Oriented to person? Yes Place? Yes  Time? Yes  Recall of three objects? Yes  Can perform simple calculations? Yes  Displays  appropriate judgment?Yes  Can read the correct time from a watch face?Yes   List the Names of Other Physician/Practitioners you currently use:  See referral list for the physicians patient is currently seeing.   Optometrist on [address] seen in March    Review of Systems: see EPIC   Objective:     General appearance: Appears stated age. Head: Normocephalic, without obvious abnormality, atraumatic  Eyes: conj clear, EOMi PEERLA  Ears: normal TM's and external ear canals both ears  Nose: Nares normal. Septum midline. Mucosa normal. No drainage or sinus tenderness.  Throat: lips, mucosa, and tongue normal; teeth and gums normal  Neck: no adenopathy, no carotid bruit, no JVD, supple, symmetrical, trachea midline and thyroid not enlarged, symmetric, no tenderness/mass/nodules  No CVA tenderness.  Lungs: clear to auscultation bilaterally  Breasts: normal appearance, no masses or tenderness, top of the pacemaker on left upper chest. Incision well-healed. It is tender.  Heart: regular rate and rhythm, S1, S2 normal, no murmur, click, rub or gallop  Abdomen: soft, non-tender; bowel sounds normal; no masses, no organomegaly  Musculoskeletal: ROM normal in all joints, no crepitus, no deformity, Normal muscle strengthen. Back  is symmetric, no curvature. Skin: Skin color, texture,  turgor normal. No rashes or lesions  Lymph nodes: Cervical, supraclavicular, and axillary nodes normal.  Neurologic: CN 2 -12 Normal, Normal symmetric reflexes. Normal coordination and gait  Psych: Alert & Oriented x 3, Mood appear stable.    Assessment:    Annual wellness medicare exam   Plan:    During the course of the visit the patient was educated and counseled about appropriate screening and preventive services including:   Declines colonoscopy- 3 hemoccult cards given Recently had flu and Zostavax vaccine at [facility]     Patient Instructions (the written plan) was given to the patient.   Medicare Attestation  I have personally reviewed:  The patient's medical and social history  Their use of alcohol, tobacco or illicit drugs  Their current medications and supplements  The patient's functional ability including ADLs,fall risks, home safety risks, cognitive, and hearing and visual impairment  Diet and physical activities  Evidence for depression or mood disorders  The patient's weight, height, BMI, and visual acuity have been recorded in the chart. I have made referrals, counseling, and provided education to the patient based on review of the above and I have provided the patient with a written personalized care plan for preventive services.

## 2013-07-30 ENCOUNTER — Other Ambulatory Visit: Payer: Self-pay

## 2013-07-30 DIAGNOSIS — Z1231 Encounter for screening mammogram for malignant neoplasm of breast: Secondary | ICD-10-CM

## 2013-08-26 ENCOUNTER — Ambulatory Visit
Admission: RE | Admit: 2013-08-26 | Discharge: 2013-08-26 | Disposition: A | Discharge status: Home / Self Care | Payer: Medicare Other | Source: Ambulatory Visit

## 2013-08-26 ENCOUNTER — Other Ambulatory Visit: Payer: Self-pay | Admitting: *Deleted

## 2013-08-26 DIAGNOSIS — Z1231 Encounter for screening mammogram for malignant neoplasm of breast: Secondary | ICD-10-CM

## 2013-08-26 MED ORDER — ALPRAZOLAM 0.5 MG PO TABS: ORAL_TABLET | ORAL | Status: DC

## 2013-08-30 ENCOUNTER — Other Ambulatory Visit: Payer: Self-pay | Admitting: Internal Medicine

## 2013-08-30 DIAGNOSIS — R928 Other abnormal and inconclusive findings on diagnostic imaging of breast: Secondary | ICD-10-CM

## 2013-09-16 ENCOUNTER — Ambulatory Visit
Admission: RE | Admit: 2013-09-16 | Discharge: 2013-09-16 | Disposition: A | Discharge status: Home / Self Care | Payer: Medicare Other | Source: Ambulatory Visit | Attending: Internal Medicine | Admitting: Internal Medicine

## 2013-09-16 ENCOUNTER — Other Ambulatory Visit: Payer: Self-pay | Admitting: Internal Medicine

## 2013-09-16 DIAGNOSIS — R921 Mammographic calcification found on diagnostic imaging of breast: Secondary | ICD-10-CM

## 2013-09-16 DIAGNOSIS — R928 Other abnormal and inconclusive findings on diagnostic imaging of breast: Secondary | ICD-10-CM

## 2013-09-20 ENCOUNTER — Ambulatory Visit
Admission: RE | Admit: 2013-09-20 | Discharge: 2013-09-20 | Disposition: A | Discharge status: Home / Self Care | Payer: Medicare Other | Source: Ambulatory Visit | Attending: Internal Medicine | Admitting: Internal Medicine

## 2013-09-20 DIAGNOSIS — R921 Mammographic calcification found on diagnostic imaging of breast: Secondary | ICD-10-CM

## 2013-09-23 ENCOUNTER — Encounter: Payer: Self-pay | Admitting: Internal Medicine

## 2013-09-23 ENCOUNTER — Ambulatory Visit (INDEPENDENT_AMBULATORY_CARE_PROVIDER_SITE_OTHER): Payer: Medicare Other | Admitting: Internal Medicine

## 2013-09-23 VITALS — BP 118/78 | HR 128 | Temp 102.4°F | Ht 59.0 in | Wt 127.0 lb

## 2013-09-23 DIAGNOSIS — J069 Acute upper respiratory infection, unspecified: Secondary | ICD-10-CM

## 2013-09-23 DIAGNOSIS — J209 Acute bronchitis, unspecified: Secondary | ICD-10-CM

## 2013-09-23 DIAGNOSIS — N390 Urinary tract infection, site not specified: Secondary | ICD-10-CM

## 2013-09-23 LAB — POCT URINALYSIS DIPSTICK
Glucose, UA: NEGATIVE
Protein, UA: NEGATIVE
Spec Grav, UA: 1.015
pH, UA: 6

## 2013-09-23 LAB — CBC WITH DIFFERENTIAL/PLATELET
Basophils Absolute: 0 10*3/uL (ref 0.0–0.1)
Eosinophils Relative: 1 % (ref 0–5)
HCT: 36.1 % (ref 36.0–46.0)
Hemoglobin: 12.5 g/dL (ref 12.0–15.0)
Lymphocytes Relative: 11 % — ABNORMAL LOW (ref 12–46)
MCHC: 34.6 g/dL (ref 30.0–36.0)
MCV: 84.3 fL (ref 78.0–100.0)
Monocytes Absolute: 1 10*3/uL (ref 0.1–1.0)
Monocytes Relative: 8 % (ref 3–12)
RBC: 4.28 MIL/uL (ref 3.87–5.11)
RDW: 13.9 % (ref 11.5–15.5)
WBC: 13.3 10*3/uL — ABNORMAL HIGH (ref 4.0–10.5)

## 2013-09-23 MED ORDER — CEFTRIAXONE SODIUM 1 G IJ SOLR
1.0000 g | Freq: Once | INTRAMUSCULAR | Status: AC
  Administered 2013-09-23: 1 g via INTRAMUSCULAR

## 2013-09-23 MED ORDER — LEVOFLOXACIN 500 MG PO TABS: 500.0000 mg | ORAL_TABLET | Freq: Every day | ORAL | Status: DC

## 2013-09-23 NOTE — Patient Instructions (Addendum)
Take Levaquin as directed for 10 days. Return for urine specimen. You have been given 1 g IM Rocephin today in the office. CBC was checked.

## 2013-09-26 LAB — URINE CULTURE

## 2013-10-01 ENCOUNTER — Other Ambulatory Visit: Payer: Self-pay | Admitting: Internal Medicine

## 2013-10-01 DIAGNOSIS — Z87440 Personal history of urinary (tract) infections: Secondary | ICD-10-CM

## 2013-10-05 ENCOUNTER — Encounter: Payer: Self-pay | Admitting: Internal Medicine

## 2013-10-05 ENCOUNTER — Other Ambulatory Visit (INDEPENDENT_AMBULATORY_CARE_PROVIDER_SITE_OTHER): Payer: Medicare Other | Admitting: Internal Medicine

## 2013-10-05 DIAGNOSIS — N390 Urinary tract infection, site not specified: Secondary | ICD-10-CM

## 2013-10-05 LAB — POCT URINALYSIS DIPSTICK
Blood, UA: NEGATIVE
Glucose, UA: NEGATIVE
Spec Grav, UA: <=1.005
Urobilinogen, UA: 0.2
pH, UA: 5

## 2013-10-05 NOTE — Patient Instructions (Signed)
Urinary tract infection has cleared

## 2013-10-05 NOTE — Progress Notes (Signed)
   Subjective:    Patient ID: Tara Rodgers, female    DOB: [birth year], 67 y.o.   MRN: [id number]  HPI nurse visit followup for urinary tract infection. No complaints. Urine specimen shows no evidence of infection     Review of Systems     Objective:   Physical Exam        Assessment & Plan:  UTI resolved  Plan: Return as scheduled

## 2013-10-25 ENCOUNTER — Other Ambulatory Visit: Payer: Self-pay | Admitting: Internal Medicine

## 2013-11-22 ENCOUNTER — Telehealth: Payer: Self-pay | Admitting: Internal Medicine

## 2013-11-22 ENCOUNTER — Other Ambulatory Visit: Payer: Self-pay

## 2013-11-22 MED ORDER — VERAPAMIL HCL ER 240 MG PO TBCR: 240.0000 mg | EXTENDED_RELEASE_TABLET | Freq: Every day | ORAL | Status: DC

## 2013-11-22 NOTE — Telephone Encounter (Signed)
Please refill her meds to [facility]

## 2013-12-05 NOTE — Patient Instructions (Signed)
Continue same medications and return in 6 months. Crestor samples provided. Please get mammogram.

## 2013-12-22 ENCOUNTER — Other Ambulatory Visit: Payer: Self-pay | Admitting: Internal Medicine

## 2013-12-30 ENCOUNTER — Other Ambulatory Visit: Payer: Medicare HMO | Admitting: Internal Medicine

## 2013-12-30 DIAGNOSIS — Z79899 Other long term (current) drug therapy: Secondary | ICD-10-CM

## 2013-12-30 DIAGNOSIS — E039 Hypothyroidism, unspecified: Secondary | ICD-10-CM

## 2013-12-30 DIAGNOSIS — E785 Hyperlipidemia, unspecified: Secondary | ICD-10-CM

## 2013-12-30 LAB — HEPATIC FUNCTION PANEL
ALT: 14 U/L (ref 0–35)
AST: 15 U/L (ref 0–37)
Albumin: 4.4 g/dL (ref 3.5–5.2)
Alkaline Phosphatase: 64 U/L (ref 39–117)
Bilirubin, Direct: 0.1 mg/dL (ref 0.0–0.3)
Indirect Bilirubin: 0.4 mg/dL (ref 0.2–1.2)
Total Bilirubin: 0.5 mg/dL (ref 0.2–1.2)
Total Protein: 6.8 g/dL (ref 6.0–8.3)

## 2013-12-30 LAB — LIPID PANEL
CHOLESTEROL: 135 mg/dL (ref 0–200)
HDL: 57 mg/dL (ref >39)
LDL Cholesterol: 49 mg/dL (ref 0–99)
Total CHOL/HDL Ratio: 2.4 Ratio
Triglycerides: 147 mg/dL (ref <150)
VLDL: 29 mg/dL (ref 0–40)

## 2013-12-31 ENCOUNTER — Ambulatory Visit (INDEPENDENT_AMBULATORY_CARE_PROVIDER_SITE_OTHER): Payer: Medicare HMO | Admitting: Internal Medicine

## 2013-12-31 ENCOUNTER — Encounter: Payer: Self-pay | Admitting: Internal Medicine

## 2013-12-31 VITALS — BP 126/90 | HR 118 | Temp 98.6°F | Wt 125.0 lb

## 2013-12-31 DIAGNOSIS — I10 Essential (primary) hypertension: Secondary | ICD-10-CM

## 2013-12-31 DIAGNOSIS — F411 Generalized anxiety disorder: Secondary | ICD-10-CM

## 2013-12-31 DIAGNOSIS — E039 Hypothyroidism, unspecified: Secondary | ICD-10-CM

## 2013-12-31 DIAGNOSIS — E785 Hyperlipidemia, unspecified: Secondary | ICD-10-CM

## 2013-12-31 DIAGNOSIS — Z853 Personal history of malignant neoplasm of breast: Secondary | ICD-10-CM

## 2013-12-31 LAB — TSH: TSH: 0.436 u[IU]/mL (ref 0.350–4.500)

## 2013-12-31 NOTE — Progress Notes (Signed)
   Subjective:    Patient ID: Tara Rodgers, female    DOB: [birth year], 68 y.o.   MRN: [id number]  HPI in today for six-month recheck of hypertension, hyperlipidemia, hypothyroidism. Has history of anxiety, GE reflux and history of breast cancer. History of osteoporosis. She's doing well. I am very pleased with her lipid results. They are excellent on Crestor 20 mg daily. Liver functions are normal. TSH is within normal limits. Blood pressure is acceptable.    Review of Systems     Objective:   Physical Exam  Skin warm and dry. Nodes none. Neck supple without JVD thyromegaly or carotid bruits. Chest clear to auscultation. Cardiac exam regular rate and rhythm normal S1 and S2. Extremities without edema.      Assessment & Plan:  Hyperlipidemia  Hypertension  Hypothyroidism  Anxiety  History of GE reflux  History of breast cancer  Osteoporosis  Plan: Continue same medications return in 6 months. Am pleased with her lab results. Several months of samples of Crestor 20 mg given for her because she has difficulty affording this. She now she can call she needs samples.

## 2013-12-31 NOTE — Patient Instructions (Signed)
Continue same medications and return in 6 months. Crestor samples given.

## 2014-03-13 ENCOUNTER — Encounter: Payer: Self-pay | Admitting: Internal Medicine

## 2014-03-13 NOTE — Progress Notes (Signed)
   Subjective:    Patient ID: Tara Rodgers, female    DOB: [birth year], 68 y.o.   MRN: [id number]  HPI In today with congested cough and urinary tract infection symptoms. Has a lot of malaise and fatigue. Has fever and shaking chills.    Review of Systems     Objective:   Physical Exam deep congested cough. Urinalysis demonstrates trace LE Culture taken. TMs and pharynx are clear. Neck is supple without adenopathy. Chest clear to auscultation without rales. Temperature 102.4 degrees         Assessment & Plan:  Acute bronchitis  Acute urinary tract infection  Plan: White blood cell count is 13,300. She was given 1 g IM Rocephin and started on Levaquin 500 milligrams daily for 10 days. She is to return in 2 weeks for repeat urinalysis.  Addendum: Urine culture greater than 100,000 Col/ml Escherichia coli sensitive to Levaquin.

## 2014-04-22 ENCOUNTER — Other Ambulatory Visit: Payer: Self-pay

## 2014-04-22 MED ORDER — CYCLOBENZAPRINE HCL 10 MG PO TABS: 10.0000 mg | ORAL_TABLET | Freq: Two times a day (BID) | ORAL | Status: DC | PRN

## 2014-05-11 ENCOUNTER — Telehealth: Payer: Self-pay | Admitting: Internal Medicine

## 2014-05-11 NOTE — Telephone Encounter (Signed)
Patient called saying she is changing pharmacies to [facility] [place]. Needs refill on Xanax. Call in Xanax 0.5 mg #60 one by mouth twice a day with 2 refills to [facility] [place]

## 2014-06-21 ENCOUNTER — Telehealth: Payer: Self-pay | Admitting: Internal Medicine

## 2014-06-21 NOTE — Telephone Encounter (Signed)
Please give Crestor samples

## 2014-06-21 NOTE — Telephone Encounter (Signed)
Patient came to the window requesting crestor samples and refill of synthroid (sent [facility] [address] [place]).  She has an appointment in October.  Please advise.

## 2014-06-27 ENCOUNTER — Other Ambulatory Visit: Payer: Self-pay

## 2014-06-27 MED ORDER — LEVOTHYROXINE SODIUM 75 MCG PO TABS: ORAL_TABLET | ORAL | Status: DC

## 2014-07-14 ENCOUNTER — Other Ambulatory Visit: Payer: Medicare HMO | Admitting: Internal Medicine

## 2014-07-14 DIAGNOSIS — I10 Essential (primary) hypertension: Secondary | ICD-10-CM

## 2014-07-14 DIAGNOSIS — Z1329 Encounter for screening for other suspected endocrine disorder: Secondary | ICD-10-CM

## 2014-07-14 DIAGNOSIS — Z13220 Encounter for screening for lipoid disorders: Secondary | ICD-10-CM

## 2014-07-14 DIAGNOSIS — E7800 Pure hypercholesterolemia, unspecified: Secondary | ICD-10-CM

## 2014-07-14 DIAGNOSIS — Z0000 Encounter for general adult medical examination without abnormal findings: Secondary | ICD-10-CM

## 2014-07-14 DIAGNOSIS — Z130 Encounter for screening for diseases of the blood and blood-forming organs and certain disorders involving the immune mechanism: Secondary | ICD-10-CM

## 2014-07-14 DIAGNOSIS — Z1321 Encounter for screening for nutritional disorder: Secondary | ICD-10-CM

## 2014-07-14 LAB — CBC WITH DIFFERENTIAL/PLATELET
Basophils Absolute: 0 10*3/uL (ref 0.0–0.1)
Basophils Relative: 0 % (ref 0–1)
Eosinophils Absolute: 0.2 10*3/uL (ref 0.0–0.7)
Eosinophils Relative: 3 % (ref 0–5)
HCT: 37.7 % (ref 36.0–46.0)
Hemoglobin: 12.8 g/dL (ref 12.0–15.0)
Lymphocytes Relative: 30 % (ref 12–46)
Lymphs Abs: 2.4 10*3/uL (ref 0.7–4.0)
MCH: 28.6 pg (ref 26.0–34.0)
MCHC: 34 g/dL (ref 30.0–36.0)
MCV: 84.3 fL (ref 78.0–100.0)
Monocytes Absolute: 0.6 10*3/uL (ref 0.1–1.0)
Monocytes Relative: 8 % (ref 3–12)
Neutro Abs: 4.7 10*3/uL (ref 1.7–7.7)
Neutrophils Relative %: 59 % (ref 43–77)
Platelets: 260 10*3/uL (ref 150–400)
RBC: 4.47 MIL/uL (ref 3.87–5.11)
RDW: 14.3 % (ref 11.5–15.5)
WBC: 7.9 10*3/uL (ref 4.0–10.5)

## 2014-07-14 LAB — COMPREHENSIVE METABOLIC PANEL
ALBUMIN: 4.2 g/dL (ref 3.5–5.2)
ALT: 22 U/L (ref 0–35)
AST: 20 U/L (ref 0–37)
Alkaline Phosphatase: 75 U/L (ref 39–117)
BUN: 19 mg/dL (ref 6–23)
CO2: 24 mEq/L (ref 19–32)
Calcium: 9.3 mg/dL (ref 8.4–10.5)
Chloride: 103 mEq/L (ref 96–112)
Creat: 1.04 mg/dL (ref 0.50–1.10)
GLUCOSE: 85 mg/dL (ref 70–99)
POTASSIUM: 4.8 meq/L (ref 3.5–5.3)
Sodium: 141 mEq/L (ref 135–145)
Total Bilirubin: 0.4 mg/dL (ref 0.2–1.2)
Total Protein: 6.6 g/dL (ref 6.0–8.3)

## 2014-07-14 LAB — TSH: TSH: 1.402 u[IU]/mL (ref 0.350–4.500)

## 2014-07-14 LAB — LIPID PANEL
Cholesterol: 149 mg/dL (ref 0–200)
HDL: 64 mg/dL
LDL Cholesterol: 49 mg/dL (ref 0–99)
Total CHOL/HDL Ratio: 2.3 ratio
Triglycerides: 179 mg/dL — ABNORMAL HIGH
VLDL: 36 mg/dL (ref 0–40)

## 2014-07-15 ENCOUNTER — Encounter: Payer: Medicare HMO | Admitting: Internal Medicine

## 2014-07-15 LAB — VITAMIN D 25 HYDROXY (VIT D DEFICIENCY, FRACTURES): Vit D, 25-Hydroxy: 43 ng/mL (ref 30–89)

## 2014-08-02 ENCOUNTER — Ambulatory Visit (INDEPENDENT_AMBULATORY_CARE_PROVIDER_SITE_OTHER): Payer: Medicare HMO | Admitting: Internal Medicine

## 2014-08-02 ENCOUNTER — Encounter: Payer: Self-pay | Admitting: Internal Medicine

## 2014-08-02 VITALS — BP 128/80 | HR 120 | Temp 98.5°F | Ht 59.0 in | Wt 129.0 lb

## 2014-08-02 DIAGNOSIS — F411 Generalized anxiety disorder: Secondary | ICD-10-CM

## 2014-08-02 DIAGNOSIS — Z853 Personal history of malignant neoplasm of breast: Secondary | ICD-10-CM

## 2014-08-02 DIAGNOSIS — Z0000 Encounter for general adult medical examination without abnormal findings: Secondary | ICD-10-CM

## 2014-08-02 DIAGNOSIS — M7918 Myalgia, other site: Secondary | ICD-10-CM

## 2014-08-02 DIAGNOSIS — I10 Essential (primary) hypertension: Secondary | ICD-10-CM

## 2014-08-02 DIAGNOSIS — M791 Myalgia: Secondary | ICD-10-CM

## 2014-08-02 DIAGNOSIS — K219 Gastro-esophageal reflux disease without esophagitis: Secondary | ICD-10-CM

## 2014-08-02 DIAGNOSIS — E039 Hypothyroidism, unspecified: Secondary | ICD-10-CM

## 2014-08-02 DIAGNOSIS — M810 Age-related osteoporosis without current pathological fracture: Secondary | ICD-10-CM

## 2014-08-02 DIAGNOSIS — J309 Allergic rhinitis, unspecified: Secondary | ICD-10-CM

## 2014-08-02 DIAGNOSIS — Z23 Encounter for immunization: Secondary | ICD-10-CM

## 2014-10-04 ENCOUNTER — Other Ambulatory Visit: Payer: Self-pay | Admitting: *Deleted

## 2014-10-04 MED ORDER — LEVOTHYROXINE SODIUM 75 MCG PO TABS: ORAL_TABLET | ORAL | Status: DC

## 2014-10-11 ENCOUNTER — Telehealth: Payer: Self-pay | Admitting: Internal Medicine

## 2014-10-11 MED ORDER — ALPRAZOLAM 0.5 MG PO TABS: 0.5000 mg | ORAL_TABLET | Freq: Two times a day (BID) | ORAL | Status: DC | PRN

## 2014-10-11 NOTE — Telephone Encounter (Addendum)
Wants refill on Xanax to [facility]. Call in refills for 6 months

## 2014-10-14 HISTORY — PX: BREAST BIOPSY: SHX20

## 2014-10-22 NOTE — Patient Instructions (Signed)
Continue same medications and return in 6 months. Have bone density study and mammogram

## 2014-10-22 NOTE — Progress Notes (Addendum)
Subjective:    Patient ID: Tara Rodgers, female    DOB: 24-Sep-1946, 69 y.o.   MRN: [id number]  HPI  69 year old White Female in today for Medicare physical examination and evaluation of medical issues.. History of hypertension, hyperlipidemia, hypothyroidism, breast cancer, anxiety, GE reflux, allergic rhinitis, osteoporosis, multinodular goiter, stress urinary incontinence, osteoarthritis of hands.  Patient had welcome to Medicare exam 2013  Past medical history: Right carpal tunnel syndrome 1995, surgery for left carpal tunnel syndrome in 2010. Bilateral tubal ligation 1986. Left tennis elbow release 1990. Foot and ankle surgery 1999. Right breast lumpectomy with axillary node dissection 1996. She took estrogen replacement in the early 1990s. Foot and ankle surgery 1999. Suprapubic sling March 2008.  History of perennial allergic rhinitis and vasomotor rhinitis. Used to be followed by Dr. Donneta Romberg for allergies in 2011. In 2001 skin test were positive for wheeze, black walnut, dog, cat, house dust, dust mites, cockroach and mold.  Remote history of PVCs in 1991 treated with verapamil which she also takes for hypertension.  Pneumovax immunization December 2010, sigmoidoscopy done 1998 I Dr. Teena Irani. She has never had a colonoscopy.  Social history: She does not smoke or consume alcohol. 2 adult children, son and a daughter. Has been married twice. Present husband is a Dealer and has heart issues. This is been stressful. He also drinks too much alcohol. She is retired from [employer].  Family history: Father died in 4818 of complications of COPD at age 32 with history of coronary artery disease. One half sister with thyroid issues.    Review of Systems  Constitutional: Positive for fatigue.  Cardiovascular:       History of hypertension  Gastrointestinal:       GERD  Endocrine:       Hypothyroidism  Allergic/Immunologic: Positive for environmental allergies.    Psychiatric/Behavioral:       History of anxiety       Objective:   Physical Exam  Constitutional: She is oriented to person, place, and time. She appears well-developed and well-nourished. No distress.  HENT:  Head: Normocephalic and atraumatic.  Right Ear: External ear normal.  Left Ear: External ear normal.  Nose: Nose normal.  Mouth/Throat: Oropharynx is clear and moist. No oropharyngeal exudate.  Eyes: Conjunctivae and EOM are normal. Pupils are equal, round, and reactive to light. Right eye exhibits no discharge. Left eye exhibits no discharge. No scleral icterus.  Neck: Neck supple. No JVD present. No thyromegaly present.  Cardiovascular: Normal rate, regular rhythm, normal heart sounds and intact distal pulses.   No murmur heard. Pulmonary/Chest: Effort normal and breath sounds normal. No respiratory distress. She has no wheezes. She has no rales.  Breasts normal female  Abdominal: Soft. Bowel sounds are normal. She exhibits no distension. There is no tenderness. There is no rebound and no guarding.  Genitourinary:  Pap deferred. Last Pap done 2013. Bimanual normal.  Musculoskeletal: Normal range of motion. She exhibits no edema.  Neurological: She is alert and oriented to person, place, and time. She has normal reflexes. No cranial nerve deficit. Coordination normal.  Skin: Skin is warm and dry. No rash noted. She is not diaphoretic.  Psychiatric: She has a normal mood and affect. Her behavior is normal. Judgment and thought content normal.  Vitals reviewed.         Assessment & Plan:  Hypertension-stable  Hypothyroidism-stable on thyroid replacement  Allergic rhinitis  History of anxiety  History of breast cancer  GE reflux  Hyperlipidemia-provided samples of Crestor  Osteoporosis-treated with vitamin D and calcium  Musculoskeletal pain for which she takes Flexeril on occasion  Plan: Continue same medications and return in 6 months. Annual mammogram.  Bone density study in the near future. 3 Hemoccult cards distributed. Declines colonoscopy.  Subjective:   Patient presents for Medicare Annual/Subsequent preventive examination.  Review Past Medical/Family/Social:   Risk Factors  Current exercise habits: Last year was going to [employer] 3 times a week with treadmill or exercise bicycle. Not as active since husband has been sick. Dietary issues discussed: Low fat low carb  Cardiac risk factors: Hypertension, hyperlipidemia  Depression Screen  (Note: if answer to either of the following is "Yes", a more complete depression screening is indicated)   Over the past two weeks, have you felt down, depressed or hopeless? No  Over the past two weeks, have you felt little interest or pleasure in doing things? No Have you lost interest or pleasure in daily life? No Do you often feel hopeless? No Do you cry easily over simple problems? No   Activities of Daily Living  In your present state of health, do you have any difficulty performing the following activities?:   Driving? No  Managing money? No  Feeding yourself? No  Getting from bed to chair? No  Climbing a flight of stairs? No  Preparing food and eating?: No  Bathing or showering? No  Getting dressed: No  Getting to the toilet? No  Using the toilet:No  Moving around from place to place: No  In the past year have you fallen or had a near fall?:yes Are you sexually active? occasionally Do you have more than one partner? No   Hearing Difficulties: No  Do you often ask people to speak up or repeat themselves? No  Do you experience ringing or noises in your ears? yes Do you have difficulty understanding soft or whispered voices? No  Do you feel that you have a problem with memory? No Do you often misplace items? No    Home Safety:  Do you have a smoke alarm at your residence? Yes Do you have grab bars in the bathroom? No Do you have throw rugs in your house?  No   Cognitive Testing  Alert? Yes Normal Appearance?Yes  Oriented to person? Yes Place? Yes  Time? Yes  Recall of three objects? Yes  Can perform simple calculations? Yes  Displays appropriate judgment?Yes  Can read the correct time from a watch face?Yes   List the Names of Other Physician/Practitioners you currently use:  See referral list for the physicians patient is currently seeing.  Sees optometrist on [address]. Wears glasses.   Review of Systems: See above   Objective:     General appearance: Appears stated age  Head: Normocephalic, without obvious abnormality, atraumatic  Eyes: conj clear, EOMi PEERLA  Ears: normal TM's and external ear canals both ears  Nose: Nares normal. Septum midline. Mucosa normal. No drainage or sinus tenderness.  Throat: lips, mucosa, and tongue normal; teeth and gums normal  Neck: no adenopathy, no carotid bruit, no JVD, supple, symmetrical, trachea midline and thyroid not enlarged, symmetric, no tenderness/mass/nodules  No CVA tenderness.  Lungs: clear to auscultation bilaterally  Breasts: normal appearance, no masses or tenderness Heart: regular rate and rhythm, S1, S2 normal, no murmur, click, rub or gallop  Abdomen: soft, non-tender; bowel sounds normal; no masses, no organomegaly  Musculoskeletal: ROM normal in all joints, no crepitus, no deformity, Normal muscle  strengthen. Back  is symmetric, no curvature. Skin: Skin color, texture, turgor normal. No rashes or lesions  Lymph nodes: Cervical, supraclavicular, and axillary nodes normal.  Neurologic: CN 2 -12 Normal, Normal symmetric reflexes. Normal coordination and gait  Psych: Alert & Oriented x 3, Mood appear stable.    Assessment:    Annual wellness medicare exam   Plan:    During the course of the visit the patient was educated and counseled about appropriate screening and preventive services including:   Annual mammogram, bone density done 2009- needs to be  repeated     Patient Instructions (the written plan) was given to the patient.  Medicare Attestation  I have personally reviewed:  The patient's medical and social history  Their use of alcohol, tobacco or illicit drugs  Their current medications and supplements  The patient's functional ability including ADLs,fall risks, home safety risks, cognitive, and hearing and visual impairment  Diet and physical activities  Evidence for depression or mood disorders  The patient's weight, height, BMI, and visual acuity have been recorded in the chart. I have made referrals, counseling, and provided education to the patient based on review of the above and I have provided the patient with a written personalized care plan for preventive services.

## 2014-11-11 ENCOUNTER — Ambulatory Visit (INDEPENDENT_AMBULATORY_CARE_PROVIDER_SITE_OTHER): Payer: Medicare HMO | Admitting: Internal Medicine

## 2014-11-11 ENCOUNTER — Encounter: Payer: Self-pay | Admitting: Internal Medicine

## 2014-11-11 VITALS — BP 126/88 | HR 116 | Temp 98.4°F | Wt 132.0 lb

## 2014-11-11 DIAGNOSIS — R350 Frequency of micturition: Secondary | ICD-10-CM

## 2014-11-11 DIAGNOSIS — N390 Urinary tract infection, site not specified: Secondary | ICD-10-CM

## 2014-11-11 LAB — POCT URINALYSIS DIPSTICK
BILIRUBIN UA: NEGATIVE
Glucose, UA: NEGATIVE
Ketones, UA: NEGATIVE
NITRITE UA: NEGATIVE
Protein, UA: NEGATIVE
SPEC GRAV UA: 1.015
Urobilinogen, UA: NEGATIVE
pH, UA: 6

## 2014-11-11 MED ORDER — CIPROFLOXACIN HCL 500 MG PO TABS: 500.0000 mg | ORAL_TABLET | Freq: Two times a day (BID) | ORAL | Status: DC

## 2014-11-11 NOTE — Progress Notes (Signed)
   Subjective:    Patient ID: Tara Rodgers, female    DOB: 17-Oct-1945, 69 y.o.   MRN: [id number]  HPI  Onset dysuria about a week ago. No back pain, nausea or vomiting. Took Azo Standard without relief. Had UTI December 2014 with organism sensitive to Cipro. Has urinary frequency.    Review of Systems     Objective:   Physical Exam  No CVA tenderness. See  U/A  results.      Assessment & Plan:  UTI  Plan: Cipro  500 mg bid x 7 days.

## 2014-11-13 ENCOUNTER — Encounter: Payer: Self-pay | Admitting: Internal Medicine

## 2014-11-13 NOTE — Patient Instructions (Signed)
Take Cipro twice daily as directed.

## 2014-11-15 ENCOUNTER — Telehealth: Payer: Self-pay | Admitting: Internal Medicine

## 2014-11-15 LAB — URINE CULTURE: Colony Count: >=100000

## 2014-11-15 NOTE — Telephone Encounter (Signed)
Patient called to find out results of her urine culture.  Advised patient that she's on the right medication.  Culture is sensitive to Cipro.  Continue taking the antibiotic that she's on.  States she's feeling much better.  Patient instructed to call us back if she's not feeling better after finishing her medication.

## 2014-12-02 ENCOUNTER — Other Ambulatory Visit: Payer: Self-pay

## 2014-12-02 DIAGNOSIS — Z1231 Encounter for screening mammogram for malignant neoplasm of breast: Secondary | ICD-10-CM

## 2014-12-09 ENCOUNTER — Telehealth: Payer: Self-pay | Admitting: Internal Medicine

## 2014-12-09 MED ORDER — VERAPAMIL HCL ER 240 MG PO TBCR: 240.0000 mg | EXTENDED_RELEASE_TABLET | Freq: Every day | ORAL | Status: DC

## 2014-12-09 NOTE — Telephone Encounter (Signed)
Sent refill on verapamil to patient pharmacy

## 2014-12-09 NOTE — Telephone Encounter (Signed)
Refill for one year 

## 2014-12-09 NOTE — Telephone Encounter (Signed)
Please e-scribe her Verapamil 240mg  into [facility].  States EVERY TIME she calls there and asks for a refill they NEVER contact us.  Could you please send refill to them?

## 2014-12-13 ENCOUNTER — Ambulatory Visit
Admission: RE | Admit: 2014-12-13 | Discharge: 2014-12-13 | Disposition: A | Discharge status: Home / Self Care | Payer: Medicare HMO | Source: Ambulatory Visit

## 2014-12-13 DIAGNOSIS — Z1231 Encounter for screening mammogram for malignant neoplasm of breast: Secondary | ICD-10-CM

## 2014-12-14 ENCOUNTER — Other Ambulatory Visit: Payer: Self-pay | Admitting: Internal Medicine

## 2014-12-14 DIAGNOSIS — R928 Other abnormal and inconclusive findings on diagnostic imaging of breast: Secondary | ICD-10-CM

## 2014-12-16 ENCOUNTER — Ambulatory Visit
Admission: RE | Admit: 2014-12-16 | Discharge: 2014-12-16 | Disposition: A | Discharge status: Home / Self Care | Payer: Medicare HMO | Source: Ambulatory Visit | Attending: Internal Medicine | Admitting: Internal Medicine

## 2014-12-16 DIAGNOSIS — R928 Other abnormal and inconclusive findings on diagnostic imaging of breast: Secondary | ICD-10-CM

## 2014-12-23 ENCOUNTER — Other Ambulatory Visit: Payer: Self-pay | Admitting: Internal Medicine

## 2014-12-23 DIAGNOSIS — R928 Other abnormal and inconclusive findings on diagnostic imaging of breast: Secondary | ICD-10-CM

## 2014-12-29 ENCOUNTER — Other Ambulatory Visit: Payer: Self-pay | Admitting: *Deleted

## 2014-12-29 ENCOUNTER — Telehealth: Payer: Self-pay | Admitting: Internal Medicine

## 2014-12-29 MED ORDER — LEVOTHYROXINE SODIUM 75 MCG PO TABS: ORAL_TABLET | ORAL | Status: DC

## 2014-12-29 NOTE — Telephone Encounter (Signed)
Refill sent for synthroid

## 2014-12-29 NOTE — Telephone Encounter (Signed)
Patient needs refill on Synthroid 75 mcg  Pharmacy:  [facility]

## 2015-01-10 ENCOUNTER — Ambulatory Visit
Admission: RE | Admit: 2015-01-10 | Discharge: 2015-01-10 | Disposition: A | Discharge status: Home / Self Care | Payer: Medicare HMO | Source: Ambulatory Visit | Attending: Internal Medicine | Admitting: Internal Medicine

## 2015-01-10 DIAGNOSIS — R928 Other abnormal and inconclusive findings on diagnostic imaging of breast: Secondary | ICD-10-CM

## 2015-01-10 MED ORDER — GADOBENATE DIMEGLUMINE 529 MG/ML IV SOLN
11.0000 mL | Freq: Once | INTRAVENOUS | Status: AC | PRN
  Administered 2015-01-10: 11 mL via INTRAVENOUS

## 2015-01-11 ENCOUNTER — Other Ambulatory Visit: Payer: Self-pay | Admitting: Internal Medicine

## 2015-01-11 DIAGNOSIS — R928 Other abnormal and inconclusive findings on diagnostic imaging of breast: Secondary | ICD-10-CM

## 2015-01-12 ENCOUNTER — Ambulatory Visit
Admission: RE | Admit: 2015-01-12 | Discharge: 2015-01-12 | Disposition: A | Discharge status: Home / Self Care | Payer: Medicare HMO | Source: Ambulatory Visit | Attending: Internal Medicine | Admitting: Internal Medicine

## 2015-01-12 ENCOUNTER — Other Ambulatory Visit: Payer: Self-pay | Admitting: Internal Medicine

## 2015-01-12 DIAGNOSIS — R928 Other abnormal and inconclusive findings on diagnostic imaging of breast: Secondary | ICD-10-CM

## 2015-01-17 ENCOUNTER — Encounter: Payer: Self-pay | Admitting: Internal Medicine

## 2015-01-17 ENCOUNTER — Ambulatory Visit (INDEPENDENT_AMBULATORY_CARE_PROVIDER_SITE_OTHER): Payer: Medicare HMO | Admitting: Internal Medicine

## 2015-01-17 VITALS — BP 126/74 | HR 120 | Temp 97.7°F | Wt 132.0 lb

## 2015-01-17 DIAGNOSIS — N390 Urinary tract infection, site not specified: Secondary | ICD-10-CM

## 2015-01-17 DIAGNOSIS — R350 Frequency of micturition: Secondary | ICD-10-CM

## 2015-01-17 DIAGNOSIS — C50911 Malignant neoplasm of unspecified site of right female breast: Secondary | ICD-10-CM | POA: Diagnosis not present

## 2015-01-17 MED ORDER — CIPROFLOXACIN HCL 500 MG PO TABS: 500.0000 mg | ORAL_TABLET | Freq: Two times a day (BID) | ORAL | Status: DC

## 2015-01-17 NOTE — Progress Notes (Signed)
   Subjective:    Patient ID: Tara Rodgers, female    DOB: 1946/05/11, 69 y.o.   MRN: [id number]  HPI Diagnosed with Stage I Ductal carcinoma right breast 1996. Treated with chemotherapy and radiation. Oncologist was Dr. Pearlie Oyster and radiation oncologist was [name]. Lesion was in right breast at 3 o'clock. Had axillary lymph node dissection with negative nodes.  Pathology in 1996 showed invasive poorly differentiated ductal carcinoma which was estrogen and progesterone receptor negative. 12 lymph nodes were sampled and none had cancer.  To see Dr. [name] soon who was her surgeon previously because recent right breast biopsy showed cancer once again-pathology showed invasive ductal carcinoma at least grade II. Mass was an upper midline right breast. There is no family history of breast cancer to patient's knowledge.  UTI symptoms since last Thursday March 31st. Last UTI was in January. Is not sexually active. No diarrhea. Has taken Azostandard so cannot do reliable dipstick today. Burns at end of stream. No fever. No shaking chills.    Review of Systems     Objective:   Physical Exam  U/A sent for culture. No CVA tenderness      Assessment & Plan:  UTI  Invasive ductal carcinoma right breast  Plan: Surgical consult April 13th with Dr. [name]. Cipro 500 mg bid x 7 days. Urine culture pending.

## 2015-01-17 NOTE — Patient Instructions (Signed)
Cipro 500 mg twice daily for 7 days. Appointment with Dr. [name] April 13

## 2015-01-20 ENCOUNTER — Telehealth: Payer: Self-pay | Admitting: *Deleted

## 2015-01-20 LAB — URINE CULTURE

## 2015-01-20 MED ORDER — CEPHALEXIN 500 MG PO CAPS: 500.0000 mg | ORAL_CAPSULE | Freq: Four times a day (QID) | ORAL | Status: DC

## 2015-01-20 NOTE — Telephone Encounter (Signed)
reviewed results with patient and given instructions to take Keflex as prescribed

## 2015-01-30 ENCOUNTER — Other Ambulatory Visit: Payer: Medicare HMO

## 2015-01-30 ENCOUNTER — Encounter: Payer: Self-pay | Admitting: Genetic Counselor

## 2015-01-30 ENCOUNTER — Ambulatory Visit (HOSPITAL_BASED_OUTPATIENT_CLINIC_OR_DEPARTMENT_OTHER): Payer: Medicare HMO | Admitting: Genetic Counselor

## 2015-01-30 DIAGNOSIS — Z315 Encounter for genetic counseling: Secondary | ICD-10-CM

## 2015-01-30 DIAGNOSIS — C50811 Malignant neoplasm of overlapping sites of right female breast: Secondary | ICD-10-CM | POA: Diagnosis not present

## 2015-01-30 DIAGNOSIS — Z853 Personal history of malignant neoplasm of breast: Secondary | ICD-10-CM

## 2015-01-30 DIAGNOSIS — C50911 Malignant neoplasm of unspecified site of right female breast: Secondary | ICD-10-CM

## 2015-01-30 NOTE — Progress Notes (Signed)
REFERRING PROVIDER: Elby Showers, MD [address] [place],  [id number]   [name], MD  PRIMARY PROVIDER:  Elby Showers, MD  PRIMARY REASON FOR VISIT:  1. Breast cancer, right   2. History of breast cancer      HISTORY OF PRESENT ILLNESS:   Ms. Elwell, a 69 y.o. female, was seen for a [place] cancer genetics consultation at the request of Dr. Renold Genta due to a personal history of cancer.  Ms. Bleiler presents to clinic today to discuss the possibility of a hereditary predisposition to cancer, genetic testing, and to further clarify her future cancer risks, as well as potential cancer risks for family members.   In 1995, at the age of 63, Ms. Ciriello was diagnosed with cancer of the right breast. This was treated with lumpectomy, chemotherapy and radiation.  In 2016 at the age of 58, Ms. Nield was diagnosed with cancer of the right breast.  She will have a mastectomy, but it is unknown whether she will have chemotherapy or radiation.  She is to see Dr. [name] soon.    CANCER HISTORY:   No history exists.     HORMONAL RISK FACTORS:  Menarche was at age 36.  First live birth at age 72.  OCP use for approximately 0 years.  Ovaries intact: yes.  Hysterectomy: no.  Menopausal status: postmenopausal.  HRT use: 0 years. Colonoscopy: no; not examined. Mammogram within the last year: yes. Number of breast biopsies: 1. Up to date with pelvic exams:  yes. Any excessive radiation exposure in the past:  For treatment of breast cancer  Past Medical History  Diagnosis Date  . Breast cancer 1995/2016    right sided breast cancer in 08/1994    History reviewed. No pertinent past surgical history.  History   Social History  . Marital Status: Married    Spouse Name: N/A  . Number of Children: 2  . Years of Education: N/A   Social History Main Topics  . Smoking status: Never Smoker   . Smokeless tobacco: Never Used  . Alcohol Use: Yes     Comment:  occasional  . Drug Use: No  . Sexual Activity: Not on file   Other Topics Concern  . None   Social History Narrative     FAMILY HISTORY:  We obtained a detailed, 4-generation family history.  Significant diagnoses are listed below: Family History  Problem Relation Age of Onset  . COPD Father   . Heart disease Father    The patient has a maternal half sister who is cancer free.  Both parents are deceased, and did not have cancer.  Her mother has 43 sisters and 7 brothers all who are cancer free.  Her father had 7 brothers and sisters.  Ms. Eustache does not have much information on her father;s family. Patient's maternal ancestors are of [place] descent, and paternal ancestors are of Caucasian descent. There is no reported Ashkenazi Jewish ancestry. There is no known consanguinity.  GENETIC COUNSELING ASSESSMENT: ALEKA TWITTY is a 69 y.o. female with a personal history of breast cancer which somewhat suggestive of a hereditary cancer syndrome and predisposition to cancer. We, therefore, discussed and recommended the following at today's visit.   DISCUSSION: We reviewed the characteristics, features and inheritance patterns of hereditary cancer syndromes. We also discussed genetic testing, including the appropriate family members to test, the process of testing, insurance coverage and turn-around-time for results. We discussed the implications of a negative, positive and/or  variant of uncertain significant result. We recommended Ms. Boys pursue genetic testing for the Breast High/moderate risk gene panel. The Breast High/Moderate Risk gene panel offered by [facility] includes sequencing and deletion/duplication analysis of the following 9 genes: ATM, BRCA1, BRCA2, CDH1, CHEK2, PALB2, PTEN, STK11, and TP53.   PLAN: After considering the risks, benefits, and limitations, Ms. Wendt  provided informed consent to pursue genetic testing and the blood sample was sent to [facility] for  analysis of the Breast High/Moderate Risk gene panel. Results should be available within approximately 2-3 weeks' time, at which point they will be disclosed by telephone to Ms. Oyer, as will any additional recommendations warranted by these results. Ms. Zachery will receive a summary of her genetic counseling visit and a copy of her results once available. This information will also be available in Epic. We encouraged Ms. Langlois to remain in contact with cancer genetics annually so that we can continuously update the family history and inform her of any changes in cancer genetics and testing that may be of benefit for her family. Ms. Errickson questions were answered to her satisfaction today. Our contact information was provided should additional questions or concerns arise.  Lastly, we encouraged Ms. Verba to remain in contact with cancer genetics annually so that we can continuously update the family history and inform her of any changes in cancer genetics and testing that may be of benefit for this family.   Ms.  Cassidy questions were answered to her satisfaction today. Our contact information was provided should additional questions or concerns arise. Thank you for the referral and allowing [place] to share in the care of your patient.   Karen P. Florene Glen, [place], [facility] Certified Genetic Counselor [email or web address] phone: [phone]  The patient was seen for a total of 60 minutes in face-to-face genetic counseling.  This patient was discussed with Drs. Magrinat, Lindi Adie and/or [name] who agrees with the above.    _______________________________________________________________________ For Office Staff:  Number of people involved in session: 2 Was an Intern/ student involved with case: no

## 2015-01-31 ENCOUNTER — Ambulatory Visit: Payer: Medicare HMO | Admitting: Radiation Oncology

## 2015-01-31 ENCOUNTER — Ambulatory Visit: Payer: Medicare HMO

## 2015-02-02 ENCOUNTER — Other Ambulatory Visit: Payer: Medicare HMO | Admitting: Internal Medicine

## 2015-02-02 DIAGNOSIS — E785 Hyperlipidemia, unspecified: Secondary | ICD-10-CM

## 2015-02-02 DIAGNOSIS — E039 Hypothyroidism, unspecified: Secondary | ICD-10-CM

## 2015-02-03 ENCOUNTER — Encounter: Payer: Self-pay | Admitting: Internal Medicine

## 2015-02-03 ENCOUNTER — Ambulatory Visit (INDEPENDENT_AMBULATORY_CARE_PROVIDER_SITE_OTHER): Payer: Medicare HMO | Admitting: Internal Medicine

## 2015-02-03 VITALS — BP 116/68 | HR 94 | Temp 98.6°F | Wt 129.0 lb

## 2015-02-03 DIAGNOSIS — Z87440 Personal history of urinary (tract) infections: Secondary | ICD-10-CM | POA: Diagnosis not present

## 2015-02-03 DIAGNOSIS — I10 Essential (primary) hypertension: Secondary | ICD-10-CM

## 2015-02-03 DIAGNOSIS — Z23 Encounter for immunization: Secondary | ICD-10-CM | POA: Diagnosis not present

## 2015-02-03 DIAGNOSIS — K219 Gastro-esophageal reflux disease without esophagitis: Secondary | ICD-10-CM

## 2015-02-03 DIAGNOSIS — E785 Hyperlipidemia, unspecified: Secondary | ICD-10-CM | POA: Diagnosis not present

## 2015-02-03 DIAGNOSIS — C50911 Malignant neoplasm of unspecified site of right female breast: Secondary | ICD-10-CM | POA: Diagnosis not present

## 2015-02-03 DIAGNOSIS — E039 Hypothyroidism, unspecified: Secondary | ICD-10-CM | POA: Diagnosis not present

## 2015-02-03 LAB — POCT URINALYSIS DIPSTICK
Bilirubin, UA: NEGATIVE
Blood, UA: NEGATIVE
Glucose, UA: NEGATIVE
Ketones, UA: NEGATIVE
Leukocytes, UA: NEGATIVE
Nitrite, UA: NEGATIVE
PH UA: 5
PROTEIN UA: NEGATIVE
SPEC GRAV UA: 1.015
Urobilinogen, UA: NEGATIVE

## 2015-02-03 LAB — LIPID PANEL
Cholesterol: 134 mg/dL (ref 0–200)
HDL: 59 mg/dL (ref >=46)
LDL CALC: 48 mg/dL (ref 0–99)
Total CHOL/HDL Ratio: 2.3 Ratio
Triglycerides: 136 mg/dL (ref <150)
VLDL: 27 mg/dL (ref 0–40)

## 2015-02-03 LAB — TSH: TSH: 0.209 u[IU]/mL — AB (ref 0.350–4.500)

## 2015-02-03 MED ORDER — PANTOPRAZOLE SODIUM 40 MG PO TBEC: 40.0000 mg | DELAYED_RELEASE_TABLET | Freq: Every day | ORAL | Status: DC

## 2015-02-03 MED ORDER — LEVOTHYROXINE SODIUM 50 MCG PO TABS: 50.0000 ug | ORAL_TABLET | Freq: Every day | ORAL | Status: DC

## 2015-02-03 NOTE — Patient Instructions (Addendum)
Change levothyroxine to 0.05 mg daily. Urine rechecked s/p UTI treatment. Good luck with surgery. Follow up here in 8 weeks. Change Nexium to Protonix

## 2015-02-03 NOTE — Progress Notes (Signed)
   Subjective:    Patient ID: Tara Rodgers, female    DOB: Sep 29, 1946, 69 y.o.   MRN: [id number]  HPI She is to have right breast surgery with reconstruction soon by Drs. [name] and [employer]. She had a recent Escherichia coli UTI that was resistant to Cipro and we had to change her to Keflex. Urinalysis today is now normal and she is asymptomatic. She has hypothyroidism. TSH is low some adjusting her thyroid replacement medication to 0.05 mg from 0.075 mg daily with plans to follow-up in 8 weeks. Lipid panel is normal on statin medication. Regarding GE reflux, she thinks Protonix would be cheaper than over-the-counter Nexium. We are prescribing generic Protonix today. She has a history of anxiety treated with Xanax. She is anxious about her upcoming surgery. Blood pressure is stable with history of essential hypertension.    Review of Systems as above     Objective:   Physical Exam  No thyromegaly. Chest clear to auscultation. Cardiac exam regular rate and rhythm normal S1 and S2. Extremities without edema.      Assessment & Plan:  Right breast cancer with previous right breast cancer 1996.  To ave breast reconstruction and mastectomy in the near future  UTI-resolved  Hypothyroidism-TSH is low. Change dosage to 0.05 mg daily and recheck in 8 weeks  Hyperlipidemia-lipid panel is normal on statin  Essential hypertension-stable on medication  Anxiety-stable on medication  Plan: Is to return in 8 weeks for TSH and follow-up.  Prevnar 13 given today  25 minutes spent with patient

## 2015-02-08 NOTE — Progress Notes (Addendum)
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   01/11/2015 Imaging breast MRI showed an irregular rim enhancing mass within the superior right breast at 11:30 measuring 2.5 x 2.4 x 2.4 cm, no other lesion or adenopathy.    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    01/12/2015 Receptors her2 ER 100% positive, PR 77% positive, HER-2 negative, Ki-67 20%   01/12/2015 Mammogram Diagnostic mammogram and ultrasound showed a 2.3 cm mass in the upper midline right breast. No axillary adenopathy.    HISTORY OF PRESENTING ILLNESS:  Tara Rodgers 69 y.o. female is here because of recently diagnosed right breast cancer.  She had right breast cancer in 1996, which was treated with lumpectomy and axillary lymph node dissection, followed by adjuvant chemotherapy and radiation.   She did not have (/need) adjuvant endocrine therapy.  She has been followed with annual mammograms since then.  Her routine screening mammograms on 12/13/2014 showed a possible asymmetrically in the right breast. She underwent diagnostic mammogram and ultrasound on 12/16/2014 which showed a suspicious irregular subcutaneous density within the right  breast, no definitive sonographic correlation was identified. She subsequently underwent breast MRI on 01/11/2015, which showed a rim enhancing mass within the right breast at 11:30 position measuring 2.5 x 2.4 x 2.4 cm. No other lesion or adenopathy. She underwent core biopsy on 01/12/2015, which showed invasive ductal carcinoma, ER positive, PR positive, HER-2 negative, Ki-67 20%.  She feels well, did not feel any palpable mass before the screening mammogram. She denies any pain, dyspnea, fatigue or any other symptoms.   MEDICAL HISTORY:  Past Medical History  Diagnosis Date  . Breast cancer 1995/2016    right sided breast cancer in 08/1994  . Hypertension     SURGICAL HISTORY: History reviewed. No pertinent past surgical history.  SOCIAL HISTORY: History   Social History  . Marital Status: Married    Spouse Name: N/A  . Number of Children: 2  . Years of Education: N/A   Occupational History  . Not on file.   Social History Main Topics  . Smoking status: Never Smoker   . Smokeless tobacco: Never Used  . Alcohol Use: Yes     Comment: occasional  . Drug Use: No  . Sexual Activity: Not on file   Other Topics Concern  . Not on file   Social History Narrative   GYN HISTORY  Menarchal: 69 yo  LMP: 1990 Contraceptive:no    HRT: one year in [place]: Family History  Problem Relation Age of Onset  . COPD Father   . Heart disease Father     ALLERGIES:  has No Known Allergies.  MEDICATIONS:  Current Outpatient Prescriptions  Medication Sig Dispense Refill  . ALPRAZolam (XANAX) 0.5 MG tablet Take 1 tablet (0.5 mg total) by mouth 2 (two) times daily as needed. 60 tablet 5  . cyclobenzaprine (FLEXERIL) 10 MG tablet Take 1 tablet (10 mg total) by mouth 2 (two) times daily as needed for muscle spasms. 90 tablet 3  . fexofenadine (ALLEGRA) 60 MG tablet Take 60 mg by mouth daily.    Marland Kitchen levothyroxine (SYNTHROID, LEVOTHROID) 50 MCG tablet Take 1 tablet (50  mcg total) by mouth daily. 30 tablet 2  . pantoprazole (PROTONIX) 40 MG tablet Take 1 tablet (40 mg total) by mouth daily. 30 tablet 11  . rosuvastatin (CRESTOR) 10 MG tablet Take 10 mg by mouth daily.    . verapamil (CALAN-SR) 240 MG CR tablet Take 1 tablet (240 mg total) by mouth daily. 30 tablet 11   No current facility-administered medications for this visit.    REVIEW OF SYSTEMS:   Constitutional: Denies fevers, chills or abnormal night sweats Eyes: Denies blurriness of vision, double vision or watery eyes Ears, nose, mouth, throat, and face: Denies mucositis or sore throat Respiratory: Denies cough, dyspnea or wheezes Cardiovascular: Denies palpitation, chest discomfort or lower extremity swelling Gastrointestinal:  Denies nausea, heartburn or change in bowel habits Skin: Denies abnormal skin rashes Lymphatics: Denies new lymphadenopathy or easy bruising Neurological:Denies numbness, tingling or new weaknesses Behavioral/Psych: Mood is stable, no new changes  All other systems were reviewed with the patient and are negative.  PHYSICAL EXAMINATION: ECOG PERFORMANCE STATUS: 0 - Asymptomatic  Filed Vitals:   02/09/15 1540  BP: 111/72  Pulse: 129  Temp: 98.2 F (36.8 C)  Resp: 18   Filed Weights   02/09/15 1540  Weight: 128 lb 9.6 oz (58.333 kg)    GENERAL:alert, no distress and comfortable SKIN: skin color, texture, turgor are normal, no rashes or significant lesions EYES: normal, conjunctiva are pink and non-injected, sclera clear OROPHARYNX:no exudate, no erythema and lips, buccal mucosa, and tongue normal  NECK: supple, thyroid normal size, non-tender, without nodularity LYMPH:  no palpable lymphadenopathy in the cervical, axillary or inguinal LUNGS: clear to auscultation and percussion with normal breathing effort HEART: regular rate & rhythm and no murmurs and no lower extremity edema ABDOMEN:abdomen soft, non-tender and normal bowel sounds Musculoskeletal:no  cyanosis of digits and no clubbing  PSYCH: alert & oriented x 3 with fluent speech NEURO: no focal motor/sensory deficits  LABORATORY DATA:  I have reviewed the data as listed Lab Results  Component Value Date   WBC 7.9 07/14/2014   HGB 12.8 07/14/2014   HCT 37.7 07/14/2014   MCV 84.3 07/14/2014   PLT 260 07/14/2014    Recent Labs  07/14/14 0941  NA 141  K 4.8  CL 103  CO2 24  GLUCOSE 85  BUN 19  CREATININE 1.04  CALCIUM 9.3  PROT 6.6  ALBUMIN 4.2  AST 20  ALT 22  ALKPHOS 75  BILITOT 0.4   PATHOLOGY REPORT" Diagnosis  Breast, right, needle core biopsy, upper midline 01/12/2015 - INVASIVE DUCTAL CARCINOMA. - SEE COMMENT Microscopic Comment The carcinoma appears at least grade II. A breast prognostic profile will be performed and the results reported Separately.  PROGNOSTIC INDICATORS - ACIS Results: IMMUNOHISTOCHEMICAL AND MORPHOMETRIC ANALYSIS BY THE AUTOMATED CELLULAR IMAGING SYSTEM (ACIS) Estrogen Receptor: 100%, POSITIVE, STRONG STAINING INTENSITY Progesterone Receptor: 77%, POSITIVE, STRONG STAINING INTENSITY Proliferation Marker Ki67: 20%  Results: HER-2/NEU BY CISH - NEGATIVE. RESULT RATIO OF HER2: CEP 17 SIGNALS  1.153/31/2016 AVERAGE HER2 COPY NUMBER PER CELL 1.90   RADIOGRAPHIC STUDIES: I have personally reviewed the radiological images as listed and agreed with the findings in the report.   Mr Breast Bilateral W Wo Contrast 01/11/2015     FINDINGS: Breast composition: d. Extreme fibroglandular tissue.  Background parenchymal enhancement: Marked background enhancement is noted within the left breast, similar to prior studies from 2009. There is minimal background enhancement within the right breast.  Right breast: There is an irregular rim enhancing mass within the superior right breast at 11:30 measuring 2.5 x 2.4 x 2.4 cm. No abnormal enhancement is identified within the skin of the superior right breast in the area of concern on mammogram.  Left  breast: Marked background enhancement, similar to prior studies. No dominant suspicious area of enhancement is identified.  Lymph nodes: No abnormal appearing lymph nodes.  Ancillary findings:  None.   IMPRESSION: Highly suspicious irregular enhancing mass within the superior right breast at 11:30.  RECOMMENDATION: Right breast second-look ultrasound. Ultrasound-guided biopsy is recommended. If no sonographic correlate is identified, MRI guided biopsy is recommended.  BI-RADS CATEGORY  5: Highly suggestive of malignancy.   Electronically Signed   By: Pamelia Hoit M.D.   On: 01/11/2015 13:08   US Breast Ltd Uni Right Inc Axilla 01/12/2015   IMPRESSION: A 2.3 cm diameter mass in the upper midline right breast the suspicious.  RECOMMENDATION: Recommend ultrasound-guided core needle biopsy. This will be performed later today.  I have discussed the findings and recommendations with the patient. Results were also provided in writing at the conclusion of the visit. If applicable, a reminder letter will be sent to the patient regarding the next appointment.  BI-RADS CATEGORY  4: Suspicious abnormality - biopsy should be considered.   Electronically Signed   By: Andres Shad   On: 01/12/2015 16:13    ASSESSMENT & PLAN:  69 year old female, with remote history of right breast cancer in 1996, status post lumpectomy with lymph node dissection, adjuvant chemotherapy and irradiation. She was found to have a right breast mass by screening mammogram.  1. Recurrent right breast invasive ductal carcinoma, cT2N0, stage II, grade 2, ER positive, PR positive, HER-2 negative -I discussed her scan and biopsy findings in great details with the patient and her family. -Giving this is a recurrent cancer in the same breast, she would need a mastectomy. She has seen Dr. Brantley Stage, and agrees to proceed with the surgery. -Giving her prior breast cancer was 20 years ago, this is likely a secondary primary. She clinically feels well, I  do not think  she needs restaging scan to ruled out for metastasis.. -I reviewed the risk of cancer recurrence after surgery. Given her strong ER/PR positivity, I would recommend adjuvant endocrine therapy with aromatase inhibitor. -We discussed the role of adjuvant chemotherapy. If the sentinel lymph node is negative, I would send her surgical tumor tissue for Oncotype DX test. I would consider adjuvant chemotherapy if the recurrence score is high. -We also discussed surveillance plan after her surgery. -She met our breast cancer navigator Dawn after my visit, Dawn will arrange her oncotype test and her follow up appointment with me.   Plan -I'll plan to see her back 3 weeks after her surgery to review Oncotype DX results, and decide if she needs adjuvant chemotherapy.   All questions were answered. The patient knows to call the clinic with any problems, questions or concerns. I spent 55 minutes counseling the patient face to face.  The total time spent in the appointment was 60 minutes and more than 50% was on counseling.     [name], MD 02/10/2015 10:56 AM

## 2015-02-09 ENCOUNTER — Ambulatory Visit (HOSPITAL_BASED_OUTPATIENT_CLINIC_OR_DEPARTMENT_OTHER): Payer: Medicare HMO | Admitting: Hematology

## 2015-02-09 ENCOUNTER — Encounter: Payer: Self-pay | Admitting: Hematology

## 2015-02-09 ENCOUNTER — Ambulatory Visit: Payer: Medicare HMO

## 2015-02-09 ENCOUNTER — Encounter: Payer: Self-pay | Admitting: *Deleted

## 2015-02-09 VITALS — BP 111/72 | HR 129 | Temp 98.2°F | Resp 18 | Ht 59.0 in | Wt 128.6 lb

## 2015-02-09 DIAGNOSIS — Z170 Estrogen receptor positive status [ER+]: Secondary | ICD-10-CM

## 2015-02-09 DIAGNOSIS — C50811 Malignant neoplasm of overlapping sites of right female breast: Secondary | ICD-10-CM | POA: Diagnosis not present

## 2015-02-09 DIAGNOSIS — C50911 Malignant neoplasm of unspecified site of right female breast: Secondary | ICD-10-CM

## 2015-02-09 NOTE — Progress Notes (Signed)
Checked in new pt with no financial concerns at this time. ° °

## 2015-02-10 ENCOUNTER — Encounter: Payer: Self-pay | Admitting: Hematology

## 2015-02-10 NOTE — Progress Notes (Signed)
Late entry from 02/09/15- Met pt during new pt appt with Dr. Leonard Schwartz navigation recourses and contact information. Discussed care plan summary and gave written copy. Encourage pt to call with questions or concern. Received verbal understanding.

## 2015-02-12 ENCOUNTER — Encounter: Payer: Self-pay | Admitting: Hematology

## 2015-02-13 ENCOUNTER — Telehealth: Payer: Self-pay | Admitting: Genetic Counselor

## 2015-02-13 ENCOUNTER — Encounter: Payer: Self-pay | Admitting: Genetic Counselor

## 2015-02-13 DIAGNOSIS — O285 Abnormal chromosomal and genetic finding on antenatal screening of mother: Secondary | ICD-10-CM | POA: Insufficient documentation

## 2015-02-13 DIAGNOSIS — Z1379 Encounter for other screening for genetic and chromosomal anomalies: Secondary | ICD-10-CM | POA: Insufficient documentation

## 2015-02-13 NOTE — Telephone Encounter (Signed)
Revealed Negative genetic testing on the Breast High/Moderate Risk panel.  Discussed that we do not know why she developed breast cancer based on this test.  There are other hereditary genes we did not test for, and there is a chance that she could have a mutation in one of those.  However, the high risk genes do not seem to be implicated in her cancer.  Discussed that her care team has been notified.

## 2015-02-14 ENCOUNTER — Encounter: Payer: Self-pay | Admitting: Genetic Counselor

## 2015-02-14 ENCOUNTER — Other Ambulatory Visit: Payer: Self-pay | Admitting: General Surgery

## 2015-02-14 DIAGNOSIS — Z1379 Encounter for other screening for genetic and chromosomal anomalies: Secondary | ICD-10-CM

## 2015-02-14 DIAGNOSIS — C50911 Malignant neoplasm of unspecified site of right female breast: Secondary | ICD-10-CM

## 2015-02-14 NOTE — Progress Notes (Signed)
HPI: [name] was previously seen in the [facility] [facility] clinic due to a personal history of cancer and concerns regarding a hereditary predisposition to cancer. Please refer to our prior cancer genetics clinic note for more information regarding [name]'s medical, social and family histories, and our assessment and recommendations, at the time. [name] recent genetic test results were disclosed to her, as were recommendations warranted by these results. These results and recommendations are discussed in more detail below.  GENETIC TEST RESULTS: At the time of [name]'s visit, we recommended she pursue genetic testing of the Breast High/Moderate risk gene panel. The Breast High/Moderate Risk gene panel offered by [facility] includes sequencing and deletion/duplication analysis of the following 9 genes: ATM, BRCA1, BRCA2, CDH1, CHEK2, PALB2, PTEN, STK11, and TP53.  The report date is February 10, 2015.  Genetic testing was normal, and did not reveal a deleterious mutation in these genes. The test report has been scanned into EPIC and is located under the Media tab.   We discussed with [name] that since the current genetic testing is not perfect, it is possible there may be a gene mutation in one of these genes that current testing cannot detect, but that chance is small. We also discussed, that it is possible that another gene that has not yet been discovered, or that we have not yet tested, is responsible for the cancer diagnoses in the family, and it is, therefore, important to remain in touch with cancer genetics in the future so that we can continue to offer [name] the most up to date genetic testing.   ADDITIONAL GENETIC TESTING: We discussed with [name] that there are other genes that are associated with increased cancer risk that can be analyzed. The laboratories that offer such testing look at these additional genes via a hereditary cancer gene panel.  Should [name] wish to pursue additional genetic testing, we are happy to discuss and coordinate this testing, at any time.    CANCER SCREENING RECOMMENDATIONS: This result is reassuring and indicates that [name] likely does not have an increased risk for a future cancer due to a mutation in one of these genes. This normal test also suggests that [name]'s cancer was most likely not due to an inherited predisposition associated with one of these genes.  Most cancers happen by chance and this negative test suggests that her cancer falls into this category.  We, therefore, recommended she continue to follow the cancer management and screening guidelines provided by her oncology and primary healthcare provider.   RECOMMENDATIONS FOR FAMILY MEMBERS: Women in this family might be at some increased risk of developing cancer, over the general population risk, simply due to the family history of cancer. We recommended women in this family have a yearly mammogram beginning at age 2, or 37 years younger than the earliest onset of cancer, an an annual clinical breast exam, and perform monthly breast self-exams. Women in this family should also have a gynecological exam as recommended by their primary provider. All family members should have a colonoscopy by age 74.  FOLLOW-UP: Lastly, we discussed with [name] that cancer genetics is a rapidly advancing field and it is possible that new genetic tests will be appropriate for her and/or her family members in the future. We encouraged her to remain in contact with cancer genetics on an annual basis so we can update her personal and family histories and let her know of advances in  cancer genetics that may benefit this family.   Our contact number was provided. [name] questions were answered to her satisfaction, and she knows she is welcome to call us at anytime with additional questions or concerns.   Tara Kayser, MS, [facility] Certified  Genetic Counselor [email or web address]

## 2015-03-02 ENCOUNTER — Telehealth: Payer: Self-pay | Admitting: *Deleted

## 2015-03-02 NOTE — Telephone Encounter (Signed)
Spoke to pt concerning surgery date and ordering oncotype. Informed pt that I will send it as soon as we received the final path. POF placed to schedule post op visit. Denies further needs. Encourage pt to call with questions or concerns.

## 2015-03-03 ENCOUNTER — Telehealth: Payer: Self-pay | Admitting: Hematology

## 2015-03-03 NOTE — Telephone Encounter (Signed)
Confirmed appointment for June. Patient did not want a calednar mailed.

## 2015-03-16 ENCOUNTER — Other Ambulatory Visit (HOSPITAL_COMMUNITY): Payer: Self-pay | Admitting: Plastic Surgery

## 2015-03-17 ENCOUNTER — Encounter (HOSPITAL_COMMUNITY): Payer: Self-pay

## 2015-03-17 ENCOUNTER — Encounter (HOSPITAL_COMMUNITY)
Admission: RE | Admit: 2015-03-17 | Discharge: 2015-03-17 | Disposition: A | Discharge status: Home / Self Care | Payer: Medicare HMO | Source: Ambulatory Visit | Attending: Plastic Surgery | Admitting: Plastic Surgery

## 2015-03-17 HISTORY — DX: Bronchitis, not specified as acute or chronic: J40

## 2015-03-17 HISTORY — DX: Personal history of other diseases of the digestive system: Z87.19

## 2015-03-17 HISTORY — DX: Unspecified asthma, uncomplicated: J45.909

## 2015-03-17 HISTORY — DX: Gastro-esophageal reflux disease without esophagitis: K21.9

## 2015-03-17 HISTORY — DX: Thyrotoxicosis, unspecified without thyrotoxic crisis or storm: E05.90

## 2015-03-17 HISTORY — DX: Anxiety disorder, unspecified: F41.9

## 2015-03-17 LAB — CBC
HEMATOCRIT: 38.4 % (ref 36.0–46.0)
Hemoglobin: 12.7 g/dL (ref 12.0–15.0)
MCH: 28.9 pg (ref 26.0–34.0)
MCHC: 33.1 g/dL (ref 30.0–36.0)
MCV: 87.3 fL (ref 78.0–100.0)
Platelets: 260 10*3/uL (ref 150–400)
RBC: 4.4 MIL/uL (ref 3.87–5.11)
RDW: 13.1 % (ref 11.5–15.5)
WBC: 9.5 10*3/uL (ref 4.0–10.5)

## 2015-03-17 LAB — BASIC METABOLIC PANEL
ANION GAP: 9 (ref 5–15)
BUN: 15 mg/dL (ref 6–20)
CALCIUM: 9.1 mg/dL (ref 8.9–10.3)
CHLORIDE: 106 mmol/L (ref 101–111)
CO2: 26 mmol/L (ref 22–32)
Creatinine, Ser: 1.03 mg/dL — ABNORMAL HIGH (ref 0.44–1.00)
GFR calc Af Amer: >60 mL/min (ref >60)
GFR calc non Af Amer: 55 mL/min — ABNORMAL LOW (ref >60)
Glucose, Bld: 83 mg/dL (ref 65–99)
Potassium: 4.4 mmol/L (ref 3.5–5.1)
Sodium: 141 mmol/L (ref 135–145)

## 2015-03-17 NOTE — Pre-Procedure Instructions (Addendum)
Tara Rodgers 03/17/2015      [id number] [place] - [address] [place]Phone: [phone]: [phone] [facility] [address] [place]Phone: [phone]: [phone]  [facility]- [place], [address]Phone: [phone]: [phone] [facility] [place]- [address]Phone: [phone]: [phone]   Your procedure is scheduled on Thursday March 23, 2015 at 7:30am.  [place]  Call this number if you have problems the morning of surgery:  [phone]   Remember:  Do not eat food or drink liquids after midnight.  Take these medicines the morning of surgery with A SIP OF WATER alprazolam (Xanax), cyclobenzaprine (Flexeril), fexofenadine (Allegra), levothyroxine  (Synthroid,Levothroid), pantoprazole (Protonix), verapamil (Calan-SR).   Do not take any aspirin, NSAIDS (advil, aleve, naproxen, ibuprofen), vitamins, supplements, and/or herbal medications five days prior to surgery starting Saturday  03/18/15.    Do not wear jewelry, make-up or nail polish.  Do not wear lotions, powders, or perfumes.  You may NOT wear deodorant.  Do not shave 48 hours prior to surgery.  Men may shave face and neck.  Do not bring valuables to the hospital.   [facility] or [facility]ables.   Contacts, dentures or bridgework may not be worn into surgery.  Leave your suitcase in the car.  After surgery it may be brought to your room.   For patients admitted to the hospital, discharge time will be determined by your treatment team.   Patients  discharged the day of surgery will not be allowed to drive home.    Please refer to Shower Instructions Sheet   Please read over the following fact sheets that you were given.  Pain Booklet, Coughing and Deep Breathing and Surgical Site Infection Prevention

## 2015-03-20 ENCOUNTER — Other Ambulatory Visit: Payer: Medicare HMO | Admitting: Internal Medicine

## 2015-03-20 DIAGNOSIS — E039 Hypothyroidism, unspecified: Secondary | ICD-10-CM

## 2015-03-20 LAB — TSH: TSH: 1.892 u[IU]/mL (ref 0.350–4.500)

## 2015-03-21 ENCOUNTER — Encounter: Payer: Self-pay | Admitting: Internal Medicine

## 2015-03-21 ENCOUNTER — Ambulatory Visit (INDEPENDENT_AMBULATORY_CARE_PROVIDER_SITE_OTHER): Payer: Medicare HMO | Admitting: Internal Medicine

## 2015-03-21 VITALS — BP 124/76 | HR 107 | Temp 97.9°F | Wt 128.5 lb

## 2015-03-21 DIAGNOSIS — E039 Hypothyroidism, unspecified: Secondary | ICD-10-CM

## 2015-03-21 NOTE — Patient Instructions (Signed)
CPE in 6 months. Continue same dose of thyroid replacement.

## 2015-03-21 NOTE — Progress Notes (Signed)
   Subjective:    Patient ID: Tara Rodgers, female    DOB: August 06, 1946, 69 y.o.   MRN: [id number]  HPI  For follow up of hypothyroidism . Changed to Levothyroxine 0.'05mg'$  daily at last visit from 0.075 mg daily as TSH was low. TSH is now within normal limits on levothyroxine 0.05 mg daily TSH is WNL. To have cancer surgery later this week. Has recurrent cancer right breast after initial diagnosis 1996. Says that radiation therapy will not be given since she's already had it to right breast on initial treatment in 1996. Will likely be on hormonal therapy with tamoxifen. This is invasive ductal carcinoma. Her lymph nodes were removed in 1996 and therefore cannot be sampled.  Feels well with current dose of thyroid replacement.  Husband sick recently with diverticulitis but recovered quickly    Review of Systems     Objective:   Physical Exam   No thyromegaly. Chest clear to auscultation. Cardiac exam regular rate and rhythm. Extremities without edema. Skin warm and dry.     Assessment & Plan:  Invasive ductal carcinoma right breast recurrent. Initial tumor 1996.  Hypothyroidism-TSH within normal limits. Continue same dose.  Plan: Patient scheduled for lumpectomy by Dr. [name] with reconstruction surgery by Dr. Harlow Mares Thursday, June 9. Continue same dose of Synthroid 0.05 mg daily. Physical exam due December 2016. Reminded her that Crestor had now gone generic which should be better for her.

## 2015-03-22 MED ORDER — HEPARIN SODIUM (PORCINE) 5000 UNIT/ML IJ SOLN
5000.0000 [IU] | INTRAMUSCULAR | Status: AC
  Administered 2015-03-23: 5000 [IU] via SUBCUTANEOUS
  Filled 2015-03-22: qty 1

## 2015-03-22 MED ORDER — CEFAZOLIN SODIUM-DEXTROSE 2-3 GM-% IV SOLR
2.0000 g | INTRAVENOUS | Status: AC
  Administered 2015-03-23: 2 g via INTRAVENOUS
  Filled 2015-03-22: qty 50

## 2015-03-23 ENCOUNTER — Inpatient Hospital Stay (HOSPITAL_COMMUNITY)
Admission: RE | Admit: 2015-03-23 | Discharge: 2015-03-25 | DRG: 583 | Disposition: A | Discharge status: Home / Self Care | Payer: Medicare HMO | Source: Ambulatory Visit | Attending: Plastic Surgery | Admitting: Plastic Surgery

## 2015-03-23 ENCOUNTER — Encounter (HOSPITAL_COMMUNITY): Payer: Self-pay | Admitting: *Deleted

## 2015-03-23 ENCOUNTER — Inpatient Hospital Stay (HOSPITAL_COMMUNITY): Payer: Medicare HMO | Admitting: Anesthesiology

## 2015-03-23 ENCOUNTER — Encounter (HOSPITAL_COMMUNITY)
Admission: RE | Disposition: A | Discharge status: Home / Self Care | Payer: Self-pay | Source: Ambulatory Visit | Attending: Plastic Surgery

## 2015-03-23 DIAGNOSIS — Z170 Estrogen receptor positive status [ER+]: Secondary | ICD-10-CM

## 2015-03-23 DIAGNOSIS — Z923 Personal history of irradiation: Secondary | ICD-10-CM

## 2015-03-23 DIAGNOSIS — K219 Gastro-esophageal reflux disease without esophagitis: Secondary | ICD-10-CM | POA: Diagnosis present

## 2015-03-23 DIAGNOSIS — Z8249 Family history of ischemic heart disease and other diseases of the circulatory system: Secondary | ICD-10-CM | POA: Diagnosis not present

## 2015-03-23 DIAGNOSIS — E780 Pure hypercholesterolemia: Secondary | ICD-10-CM | POA: Diagnosis present

## 2015-03-23 DIAGNOSIS — C50911 Malignant neoplasm of unspecified site of right female breast: Secondary | ICD-10-CM | POA: Diagnosis present

## 2015-03-23 HISTORY — PX: LATISSIMUS FLAP TO BREAST: SHX5357

## 2015-03-23 HISTORY — PX: TOTAL MASTECTOMY: SHX6129

## 2015-03-23 HISTORY — PX: RECONSTRUCTION BREAST W/ LATISSIMUS DORSI FLAP: SUR1078

## 2015-03-23 HISTORY — PX: MASTECTOMY: SHX3

## 2015-03-23 SURGERY — RECONSTRUCTION, BREAST, USING LATISSIMUS DORSI MYOCUTANEOUS FLAP
Anesthesia: General | Site: Breast | Laterality: Right

## 2015-03-23 MED ORDER — STERILE WATER FOR INJECTION IJ SOLN
INTRAMUSCULAR | Status: AC
  Filled 2015-03-23: qty 10

## 2015-03-23 MED ORDER — FENTANYL CITRATE (PF) 250 MCG/5ML IJ SOLN
INTRAMUSCULAR | Status: AC
  Filled 2015-03-23: qty 5

## 2015-03-23 MED ORDER — CEFAZOLIN SODIUM 1-5 GM-% IV SOLN
1.0000 g | Freq: Three times a day (TID) | INTRAVENOUS | Status: DC
  Administered 2015-03-23 – 2015-03-25 (×6): 1 g via INTRAVENOUS
  Filled 2015-03-23 (×8): qty 50

## 2015-03-23 MED ORDER — GLYCOPYRROLATE 0.2 MG/ML IJ SOLN
INTRAMUSCULAR | Status: DC | PRN
  Administered 2015-03-23: 0.6 mg via INTRAVENOUS

## 2015-03-23 MED ORDER — ONDANSETRON HCL 4 MG/2ML IJ SOLN
INTRAMUSCULAR | Status: DC | PRN
  Administered 2015-03-23: 4 mg via INTRAVENOUS

## 2015-03-23 MED ORDER — PANTOPRAZOLE SODIUM 40 MG PO TBEC
40.0000 mg | DELAYED_RELEASE_TABLET | Freq: Every day | ORAL | Status: DC
  Administered 2015-03-24 – 2015-03-25 (×2): 40 mg via ORAL
  Filled 2015-03-23 (×3): qty 1

## 2015-03-23 MED ORDER — PROMETHAZINE HCL 25 MG/ML IJ SOLN
6.2500 mg | INTRAMUSCULAR | Status: DC | PRN
  Administered 2015-03-23 – 2015-03-25 (×4): 6.25 mg via INTRAVENOUS
  Filled 2015-03-23 (×5): qty 1

## 2015-03-23 MED ORDER — DOCUSATE SODIUM 100 MG PO CAPS
100.0000 mg | ORAL_CAPSULE | Freq: Every day | ORAL | Status: DC
  Administered 2015-03-24 – 2015-03-25 (×2): 100 mg via ORAL
  Filled 2015-03-23 (×2): qty 1

## 2015-03-23 MED ORDER — PHENYLEPHRINE 40 MCG/ML (10ML) SYRINGE FOR IV PUSH (FOR BLOOD PRESSURE SUPPORT)
PREFILLED_SYRINGE | INTRAVENOUS | Status: AC
  Filled 2015-03-23: qty 10

## 2015-03-23 MED ORDER — DEXTROSE-NACL 5-0.45 % IV SOLN
INTRAVENOUS | Status: DC
  Administered 2015-03-23 – 2015-03-24 (×2): via INTRAVENOUS

## 2015-03-23 MED ORDER — ROCURONIUM BROMIDE 100 MG/10ML IV SOLN
INTRAVENOUS | Status: DC | PRN
  Administered 2015-03-23: 10 mg via INTRAVENOUS
  Administered 2015-03-23: 50 mg via INTRAVENOUS
  Administered 2015-03-23 (×2): 20 mg via INTRAVENOUS

## 2015-03-23 MED ORDER — CHLORHEXIDINE GLUCONATE 4 % EX LIQD
1.0000 | Freq: Once | CUTANEOUS | Status: DC
Start: 2015-03-24 — End: 2015-03-23

## 2015-03-23 MED ORDER — METHOCARBAMOL 500 MG PO TABS: 500.0000 mg | ORAL_TABLET | Freq: Four times a day (QID) | ORAL | Status: DC | PRN

## 2015-03-23 MED ORDER — SUCCINYLCHOLINE CHLORIDE 20 MG/ML IJ SOLN
INTRAMUSCULAR | Status: AC
  Filled 2015-03-23: qty 1

## 2015-03-23 MED ORDER — CHLORHEXIDINE GLUCONATE 4 % EX LIQD: 1.0000 "application " | Freq: Once | CUTANEOUS | Status: DC

## 2015-03-23 MED ORDER — 0.9 % SODIUM CHLORIDE (POUR BTL) OPTIME
TOPICAL | Status: DC | PRN
  Administered 2015-03-23 (×3): 1000 mL

## 2015-03-23 MED ORDER — PROPOFOL 10 MG/ML IV BOLUS
INTRAVENOUS | Status: DC | PRN
  Administered 2015-03-23: 150 mg via INTRAVENOUS

## 2015-03-23 MED ORDER — GENTAMICIN SULFATE 40 MG/ML IJ SOLN
Freq: Once | INTRAMUSCULAR | Status: AC
  Administered 2015-03-23: 08:00:00
  Filled 2015-03-23: qty 1

## 2015-03-23 MED ORDER — DEXAMETHASONE SODIUM PHOSPHATE 4 MG/ML IJ SOLN
INTRAMUSCULAR | Status: DC | PRN
  Administered 2015-03-23: 4 mg via INTRAVENOUS

## 2015-03-23 MED ORDER — MIDAZOLAM HCL 5 MG/5ML IJ SOLN
INTRAMUSCULAR | Status: DC | PRN
  Administered 2015-03-23: 2 mg via INTRAVENOUS

## 2015-03-23 MED ORDER — PROPOFOL 10 MG/ML IV BOLUS
INTRAVENOUS | Status: AC
  Filled 2015-03-23: qty 20

## 2015-03-23 MED ORDER — NEOSTIGMINE METHYLSULFATE 10 MG/10ML IV SOLN
INTRAVENOUS | Status: DC | PRN
  Administered 2015-03-23: 4 mg via INTRAVENOUS

## 2015-03-23 MED ORDER — LIDOCAINE HCL (CARDIAC) 20 MG/ML IV SOLN
INTRAVENOUS | Status: DC | PRN
  Administered 2015-03-23: 80 mg via INTRAVENOUS

## 2015-03-23 MED ORDER — PANTOPRAZOLE SODIUM 40 MG PO TBEC
40.0000 mg | DELAYED_RELEASE_TABLET | Freq: Once | ORAL | Status: AC
  Administered 2015-03-23: 40 mg via ORAL
  Filled 2015-03-23: qty 1

## 2015-03-23 MED ORDER — LIDOCAINE HCL (CARDIAC) 20 MG/ML IV SOLN
INTRAVENOUS | Status: AC
  Filled 2015-03-23: qty 5

## 2015-03-23 MED ORDER — NEOSTIGMINE METHYLSULFATE 10 MG/10ML IV SOLN
INTRAVENOUS | Status: AC
  Filled 2015-03-23: qty 1

## 2015-03-23 MED ORDER — LACTATED RINGERS IV SOLN
INTRAVENOUS | Status: DC | PRN
  Administered 2015-03-23 (×4): via INTRAVENOUS

## 2015-03-23 MED ORDER — ONDANSETRON HCL 4 MG/2ML IJ SOLN
INTRAMUSCULAR | Status: AC
  Filled 2015-03-23: qty 2

## 2015-03-23 MED ORDER — HYDROMORPHONE HCL 1 MG/ML IJ SOLN
INTRAMUSCULAR | Status: AC
  Filled 2015-03-23: qty 1

## 2015-03-23 MED ORDER — ROCURONIUM BROMIDE 50 MG/5ML IV SOLN
INTRAVENOUS | Status: AC
  Filled 2015-03-23: qty 2

## 2015-03-23 MED ORDER — GLYCOPYRROLATE 0.2 MG/ML IJ SOLN
INTRAMUSCULAR | Status: AC
  Filled 2015-03-23: qty 4

## 2015-03-23 MED ORDER — PHENYLEPHRINE HCL 10 MG/ML IJ SOLN
INTRAMUSCULAR | Status: DC | PRN
  Administered 2015-03-23: 40 ug via INTRAVENOUS
  Administered 2015-03-23 (×3): 80 ug via INTRAVENOUS
  Administered 2015-03-23: 40 ug via INTRAVENOUS
  Administered 2015-03-23: 80 ug via INTRAVENOUS

## 2015-03-23 MED ORDER — LEVOTHYROXINE SODIUM 50 MCG PO TABS
50.0000 ug | ORAL_TABLET | Freq: Every day | ORAL | Status: DC
  Administered 2015-03-24 – 2015-03-25 (×2): 50 ug via ORAL
  Filled 2015-03-23 (×2): qty 1

## 2015-03-23 MED ORDER — HEPARIN SODIUM (PORCINE) 5000 UNIT/ML IJ SOLN
5000.0000 [IU] | Freq: Three times a day (TID) | INTRAMUSCULAR | Status: DC
  Administered 2015-03-24 – 2015-03-25 (×3): 5000 [IU] via SUBCUTANEOUS
  Filled 2015-03-23 (×2): qty 1

## 2015-03-23 MED ORDER — FENTANYL CITRATE (PF) 100 MCG/2ML IJ SOLN
INTRAMUSCULAR | Status: DC | PRN
  Administered 2015-03-23: 100 ug via INTRAVENOUS
  Administered 2015-03-23 (×5): 50 ug via INTRAVENOUS

## 2015-03-23 MED ORDER — MIDAZOLAM HCL 2 MG/2ML IJ SOLN
INTRAMUSCULAR | Status: AC
  Filled 2015-03-23: qty 2

## 2015-03-23 MED ORDER — ALBUMIN HUMAN 5 % IV SOLN
INTRAVENOUS | Status: DC | PRN
  Administered 2015-03-23: 09:00:00 via INTRAVENOUS

## 2015-03-23 MED ORDER — EPHEDRINE SULFATE 50 MG/ML IJ SOLN
INTRAMUSCULAR | Status: AC
  Filled 2015-03-23: qty 1

## 2015-03-23 MED ORDER — VERAPAMIL HCL ER 240 MG PO TBCR
240.0000 mg | EXTENDED_RELEASE_TABLET | Freq: Every day | ORAL | Status: DC
  Administered 2015-03-24 – 2015-03-25 (×2): 240 mg via ORAL
  Filled 2015-03-23 (×2): qty 1

## 2015-03-23 MED ORDER — ARTIFICIAL TEARS OP OINT
TOPICAL_OINTMENT | OPHTHALMIC | Status: AC
  Filled 2015-03-23: qty 3.5

## 2015-03-23 MED ORDER — PHENYLEPHRINE HCL 10 MG/ML IJ SOLN
10.0000 mg | INTRAMUSCULAR | Status: DC | PRN
  Administered 2015-03-23: 10 ug/min via INTRAVENOUS

## 2015-03-23 MED ORDER — HYDROMORPHONE HCL 1 MG/ML IJ SOLN
0.2500 mg | INTRAMUSCULAR | Status: DC | PRN
  Administered 2015-03-23: 0.5 mg via INTRAVENOUS

## 2015-03-23 MED ORDER — ALPRAZOLAM 0.5 MG PO TABS
0.5000 mg | ORAL_TABLET | Freq: Two times a day (BID) | ORAL | Status: DC | PRN
  Administered 2015-03-24: 0.5 mg via ORAL
  Filled 2015-03-23: qty 1

## 2015-03-23 MED ORDER — HYDROMORPHONE HCL 2 MG PO TABS
2.0000 mg | ORAL_TABLET | ORAL | Status: DC | PRN
  Administered 2015-03-23 (×3): 2 mg via ORAL
  Administered 2015-03-24 – 2015-03-25 (×6): 4 mg via ORAL
  Filled 2015-03-23 (×2): qty 2
  Filled 2015-03-23 (×3): qty 1
  Filled 2015-03-23: qty 2
  Filled 2015-03-23: qty 1
  Filled 2015-03-23 (×2): qty 2
  Filled 2015-03-23: qty 1

## 2015-03-23 MED ORDER — EPHEDRINE SULFATE 50 MG/ML IJ SOLN
INTRAMUSCULAR | Status: DC | PRN
  Administered 2015-03-23 (×3): 10 mg via INTRAVENOUS

## 2015-03-23 MED ORDER — CALCIUM CARBONATE ANTACID 500 MG PO CHEW
1.0000 | CHEWABLE_TABLET | ORAL | Status: DC | PRN
  Administered 2015-03-23 – 2015-03-24 (×2): 200 mg via ORAL
  Filled 2015-03-23 (×3): qty 1

## 2015-03-23 MED ORDER — ROSUVASTATIN CALCIUM 20 MG PO TABS
20.0000 mg | ORAL_TABLET | Freq: Every day | ORAL | Status: DC
  Administered 2015-03-25: 20 mg via ORAL
  Filled 2015-03-23 (×2): qty 1

## 2015-03-23 SURGICAL SUPPLY — 94 items
ADH SKN CLS APL DERMABOND .7 (GAUZE/BANDAGES/DRESSINGS)
ADH SKN CLS LQ APL DERMABOND (GAUZE/BANDAGES/DRESSINGS) ×2
APPLIER CLIP 9.375 MED OPEN (MISCELLANEOUS) ×3
APR CLP MED 9.3 20 MLT OPN (MISCELLANEOUS) ×1
ATCH SMKEVC FLXB CAUT HNDSWH (FILTER) ×2 IMPLANT
BAG DECANTER FOR FLEXI CONT (MISCELLANEOUS) ×3 IMPLANT
BINDER BREAST LRG (GAUZE/BANDAGES/DRESSINGS) ×2 IMPLANT
BINDER BREAST XLRG (GAUZE/BANDAGES/DRESSINGS) IMPLANT
BINDER BREAST XXLRG (GAUZE/BANDAGES/DRESSINGS) IMPLANT
BIOPATCH RED 1 DISK 7.0 (GAUZE/BANDAGES/DRESSINGS) ×4 IMPLANT
BIOPATCH RED 1IN DISK 7.0MM (GAUZE/BANDAGES/DRESSINGS) ×2
BLADE 10 SAFETY STRL DISP (BLADE) ×1 IMPLANT
BLADE SURG 15 STRL LF DISP TIS (BLADE) ×1 IMPLANT
BLADE SURG 15 STRL SS (BLADE) ×3
CANISTER SUCTION 2500CC (MISCELLANEOUS) ×10 IMPLANT
CHLORAPREP W/TINT 26ML (MISCELLANEOUS) ×6 IMPLANT
CLIP APPLIE 9.375 MED OPEN (MISCELLANEOUS) ×1 IMPLANT
CLIP TI MEDIUM 6 (CLIP) ×1 IMPLANT
COVER SURGICAL LIGHT HANDLE (MISCELLANEOUS) ×6 IMPLANT
DERMABOND ADHESIVE PROPEN (GAUZE/BANDAGES/DRESSINGS) ×4
DERMABOND ADVANCED (GAUZE/BANDAGES/DRESSINGS)
DERMABOND ADVANCED .7 DNX12 (GAUZE/BANDAGES/DRESSINGS) ×3 IMPLANT
DERMABOND ADVANCED .7 DNX6 (GAUZE/BANDAGES/DRESSINGS) IMPLANT
DEVICE DISSECT PLASMABLAD 3.0S (MISCELLANEOUS) ×1 IMPLANT
DRAIN CHANNEL 19F RND (DRAIN) ×9 IMPLANT
DRAPE CHEST BREAST 15X10 FENES (DRAPES) ×3 IMPLANT
DRAPE INCISE 23X17 IOBAN STRL (DRAPES) ×2
DRAPE INCISE 23X17 STRL (DRAPES) ×1 IMPLANT
DRAPE INCISE IOBAN 23X17 STRL (DRAPES) ×1 IMPLANT
DRAPE ORTHO SPLIT 77X108 STRL (DRAPES) ×12
DRAPE PROXIMA HALF (DRAPES) ×6 IMPLANT
DRAPE SURG 17X23 STRL (DRAPES) ×12 IMPLANT
DRAPE SURG ORHT 6 SPLT 77X108 (DRAPES) ×4 IMPLANT
DRAPE UTILITY XL STRL (DRAPES) ×2 IMPLANT
DRAPE WARM FLUID 44X44 (DRAPE) ×3 IMPLANT
DRSG OPSITE 6X11 MED (GAUZE/BANDAGES/DRESSINGS) ×2 IMPLANT
DRSG PAD ABDOMINAL 8X10 ST (GAUZE/BANDAGES/DRESSINGS) IMPLANT
DRSG SORBAVIEW 3.5X5-5/16 MED (GAUZE/BANDAGES/DRESSINGS) ×6 IMPLANT
DRSG TEGADERM 4X4.75 (GAUZE/BANDAGES/DRESSINGS) IMPLANT
ELECT BLADE 6.5 EXT (BLADE) ×2 IMPLANT
ELECT CAUTERY BLADE 6.4 (BLADE) ×7 IMPLANT
ELECT REM PT RETURN 9FT ADLT (ELECTROSURGICAL) ×6
ELECTRODE REM PT RTRN 9FT ADLT (ELECTROSURGICAL) ×2 IMPLANT
EVACUATOR SILICONE 100CC (DRAIN) ×9 IMPLANT
EVACUATOR SMOKE ACCUVAC VALLEY (FILTER) ×2
GAUZE SPONGE 4X4 12PLY STRL (GAUZE/BANDAGES/DRESSINGS) ×1 IMPLANT
GAUZE XEROFORM 5X9 LF (GAUZE/BANDAGES/DRESSINGS) IMPLANT
GLOVE BIO SURGEON STRL SZ7.5 (GLOVE) ×12 IMPLANT
GLOVE BIOGEL PI IND STRL 6.5 (GLOVE) IMPLANT
GLOVE BIOGEL PI IND STRL 8 (GLOVE) ×3 IMPLANT
GLOVE BIOGEL PI INDICATOR 6.5 (GLOVE) ×10
GLOVE BIOGEL PI INDICATOR 8 (GLOVE) ×14
GLOVE BIOGEL PI ORTHO PRO SZ7 (GLOVE) ×2
GLOVE ECLIPSE 7.5 STRL STRAW (GLOVE) ×3 IMPLANT
GLOVE PI ORTHO PRO STRL SZ7 (GLOVE) IMPLANT
GLOVE SURG SS PI 6.5 STRL IVOR (GLOVE) ×10 IMPLANT
GOWN STRL REUS W/ TWL LRG LVL3 (GOWN DISPOSABLE) ×3 IMPLANT
GOWN STRL REUS W/ TWL XL LVL3 (GOWN DISPOSABLE) ×3 IMPLANT
GOWN STRL REUS W/TWL LRG LVL3 (GOWN DISPOSABLE) ×15
GOWN STRL REUS W/TWL XL LVL3 (GOWN DISPOSABLE) ×9
IMPLANT SALINE SM RND 300CC (Breast) ×2 IMPLANT
KIT BASIN OR (CUSTOM PROCEDURE TRAY) ×6 IMPLANT
KIT ROOM TURNOVER OR (KITS) ×4 IMPLANT
LIQUID BAND (GAUZE/BANDAGES/DRESSINGS) ×1 IMPLANT
MARKER SKIN DUAL TIP RULER LAB (MISCELLANEOUS) ×3 IMPLANT
NS IRRIG 1000ML POUR BTL (IV SOLUTION) ×9 IMPLANT
PACK GENERAL/GYN (CUSTOM PROCEDURE TRAY) ×6 IMPLANT
PAD ABD 8X10 STRL (GAUZE/BANDAGES/DRESSINGS) ×4 IMPLANT
PAD ARMBOARD 7.5X6 YLW CONV (MISCELLANEOUS) ×7 IMPLANT
PENCIL BUTTON HOLSTER BLD 10FT (ELECTRODE) ×1 IMPLANT
PLASMABLADE 3.0S (MISCELLANEOUS) ×3
PREFILTER EVAC NS 1 1/3-3/8IN (MISCELLANEOUS) ×3 IMPLANT
SET ASEPTIC TRANSFER (MISCELLANEOUS) ×2 IMPLANT
SPECIMEN JAR LARGE (MISCELLANEOUS) ×2 IMPLANT
SPECIMEN JAR X LARGE (MISCELLANEOUS) IMPLANT
SPONGE LAP 18X18 X RAY DECT (DISPOSABLE) ×2 IMPLANT
STAPLER VISISTAT 35W (STAPLE) ×5 IMPLANT
SUT ETHILON 2 0 FS 18 (SUTURE) ×1 IMPLANT
SUT MNCRL AB 3-0 PS2 18 (SUTURE) ×8 IMPLANT
SUT MON AB 2-0 CT1 36 (SUTURE) ×6 IMPLANT
SUT MON AB 5-0 PS2 18 (SUTURE) ×1 IMPLANT
SUT PDS AB 0 CT 36 (SUTURE) ×8 IMPLANT
SUT PROLENE 3 0 PS 1 (SUTURE) ×10 IMPLANT
SUT VIC AB 3-0 FS2 27 (SUTURE) ×2 IMPLANT
SUT VIC AB 3-0 SH 18 (SUTURE) ×7 IMPLANT
SUT VIC AB 3-0 SH 8-18 (SUTURE) ×5 IMPLANT
SYR 50ML SLIP (SYRINGE) IMPLANT
SYR BULB IRRIGATION 50ML (SYRINGE) ×6 IMPLANT
TAPE STRIPS DRAPE STRL (GAUZE/BANDAGES/DRESSINGS) ×3 IMPLANT
TOWEL OR 17X24 6PK STRL BLUE (TOWEL DISPOSABLE) ×10 IMPLANT
TOWEL OR 17X26 10 PK STRL BLUE (TOWEL DISPOSABLE) ×6 IMPLANT
TRAY FOLEY CATH 14FRSI W/METER (CATHETERS) ×3 IMPLANT
TUBE CONNECTING 12'X1/4 (SUCTIONS) ×3
TUBE CONNECTING 12X1/4 (SUCTIONS) ×6 IMPLANT

## 2015-03-23 NOTE — Op Note (Signed)
Preoperative Diagnosis: breast cancer  Postoprative Diagnosis: breast cancer  Procedure: Procedure(s): RIGHT TOTAL MASTECTOMY   Surgeon: [name] T   Assistants: none  Anesthesia:  General endotracheal anesthesia  Indications: patient is status post recent diagnosis of T1b ER/PR positive HER-2/neu negative invasive ductal carcinoma of the right breast with a history of right breast lumpectomy, axillary dissection and radiation therapy 19 years ago for invasive ductal carcinoma. After extensive discussion detailed elsewhere we have elected to proceed with right total mastectomy with immediate reconstruction as initial surgical treatment for her cancer.  Procedure Detail:  Patient was brought to the operating room, placed in supine position on the operating table, and general endotracheal anesthesia induced. She received subcutaneous heparin and preoperative IV buttocks. Foley catheter was placed. The right breast chest and upper arm were widely sterilely prepped and draped. Patient timeout was performed and correct procedure verified. A curvilinear incision was planned encompassing the nipple areolar complex and the previous lumpectomy incision just above the medial to the nipple. The incision was made and skin and subcutaneous flaps were developed medially to the edge of the sternum, superiorly toward the clavicle, inferiorly to the inframammary crease and laterally out to the anterior border of the latissimus which was identified and exposed. After complete creation of the flaps the breast was reflected off the chest wall working medial to lateral  Dissecting off of the pectoralis major and serratus working laterally freeing the edge of the pectoralis major and dissecting this up off the edge of the latissimus working up toward the axilla. As the axilla was approached there was a clear demarcation of scar at her previous axillary dissection and I came across this as the end of the axillary  portion of the breast. There was no palpable mass or adenopathy of any kind in the axilla which had been previously dissected. The specimen was oriented and sent for permanent section. The wound was thoroughly irrigated and hemostasis assured. Skin flaps appeared healthy. Dr. Harlow Mares then proceeded with reconstruction.    Findings: As above  Estimated Blood Loss:  Minimal  Blood Given: none          Specimens: right total mastectomy        Complications:  * No complications entered in OR log *         Disposition: Dr. Harlow Mares proceeded with latissimus reconstruction         Condition: stable

## 2015-03-23 NOTE — Anesthesia Postprocedure Evaluation (Signed)
  Anesthesia Post-op Note  Patient: Tara Rodgers  Procedure(s) Performed: Procedure(s): LATISSIMUS FLAP TO BREAST WITH PLACEMENT OF IMPLANT FOR BREAST RECONSTRUCTION (Right) RIGHT TOTAL MASTECTOMY (Right)  Patient Location: PACU  Anesthesia Type:General  Level of Consciousness: awake and alert   Airway and Oxygen Therapy: Patient Spontanous Breathing  Post-op Pain: mild  Post-op Assessment: Post-op Vital signs reviewed, Patient's Cardiovascular Status Stable and Respiratory Function Stable  Post-op Vital Signs: Reviewed  Filed Vitals:   03/23/15 1430  BP: 111/52  Pulse: 91  Temp:   Resp: 25    Complications: No apparent anesthesia complications

## 2015-03-23 NOTE — Anesthesia Preprocedure Evaluation (Addendum)
Anesthesia Evaluation  Patient identified by MRN, date of birth, ID band Patient awake    Reviewed: Allergy & Precautions, H&P , NPO status , Patient's Chart, lab work & pertinent test results  Airway Mallampati: II  TM Distance: >3 FB Neck ROM: Full    Dental no notable dental hx. (+) Upper Dentures, Dental Advisory Given   Pulmonary asthma ,  breath sounds clear to auscultation  Pulmonary exam normal       Cardiovascular hypertension, Pt. on medications Rhythm:Regular Rate:Normal     Neuro/Psych Anxiety negative neurological ROS  negative psych ROS   GI/Hepatic Neg liver ROS, GERD-  Medicated and Controlled,  Endo/Other  Hypothyroidism Hyperthyroidism   Renal/GU negative Renal ROS  negative genitourinary   Musculoskeletal   Abdominal   Peds  Hematology negative hematology ROS (+)   Anesthesia Other Findings   Reproductive/Obstetrics negative OB ROS                            Anesthesia Physical Anesthesia Plan  ASA: II  Anesthesia Plan: General   Post-op Pain Management:    Induction: Intravenous  Airway Management Planned: Oral ETT  Additional Equipment:   Intra-op Plan:   Post-operative Plan: Extubation in OR  Informed Consent: I have reviewed the patients History and Physical, chart, labs and discussed the procedure including the risks, benefits and alternatives for the proposed anesthesia with the patient or authorized representative who has indicated his/her understanding and acceptance.   Dental advisory given  Plan Discussed with: CRNA  Anesthesia Plan Comments:         Anesthesia Quick Evaluation

## 2015-03-23 NOTE — Brief Op Note (Signed)
03/23/2015  1:21 PM  PATIENT:  Tara Rodgers  69 y.o. female  PRE-OPERATIVE DIAGNOSIS:  RIGHT breast cancer  POST-OPERATIVE DIAGNOSIS:  RIGHT breast cancer  PROCEDURE:  Procedure(s): LATISSIMUS FLAP TO BREAST WITH PLACEMENT OF IMPLANT FOR BREAST RECONSTRUCTION (Right) RIGHT TOTAL MASTECTOMY (Right)  SURGEON:  Surgeon(s) and Role: Panel 1:    * [name], MD - Primary  Panel 2:    * [name], MD - Primary  PHYSICIAN ASSISTANT:   ASSISTANTS: none   ANESTHESIA:   general  EBL:  Total I/O In: 2950 [I.V.:2700; IV Piggyback:250] Out: 700 [Urine:550; Blood:150]  BLOOD ADMINISTERED:none  DRAINS: (3) Jackson-Pratt drain(s) with closed bulb suction in the right back donor site(2) and right mastectomy space (1)   LOCAL MEDICATIONS USED:  NONE  SPECIMEN:  No Specimen  DISPOSITION OF SPECIMEN:  N/A  COUNTS:  YES  TOURNIQUET:  * No tourniquets in log *  DICTATION: .Other Dictation: Dictation Number [id number]  PLAN OF CARE: Admit to inpatient   PATIENT DISPOSITION:  PACU - hemodynamically stable.   Delay start of Pharmacological VTE agent (>24hrs) due to surgical blood loss or risk of bleeding: no

## 2015-03-23 NOTE — Interval H&P Note (Signed)
History and Physical Interval Note:  03/23/2015 7:13 AM  Tara Rodgers  has presented today for surgery, with the diagnosis of breast cancer  The various methods of treatment have been discussed with the patient and family. After consideration of risks, benefits and other options for treatment, the patient has consented to  Procedure(s): LATISSIMUS FLAP TO BREAST WITH PLACEMENT OF IMPLANT FOR BREAST RECONSTRUCTION (Right) RIGHT TOTAL MASTECTOMY (Right) as a surgical intervention .  The patient's history has been reviewed, patient examined, no change in status, stable for surgery.  I have reviewed the patient's chart and labs.  Questions were answered to the patient's satisfaction.     Lovelace Cerveny T

## 2015-03-23 NOTE — Anesthesia Procedure Notes (Signed)
Procedure Name: Intubation Date/Time: 03/23/2015 7:49 AM Performed by: Scheryl Darter Pre-anesthesia Checklist: Patient identified, Emergency Drugs available, Suction available, Patient being monitored and Timeout performed Patient Re-evaluated:Patient Re-evaluated prior to inductionOxygen Delivery Method: Circle system utilized Preoxygenation: Pre-oxygenation with 100% oxygen Intubation Type: IV induction Ventilation: Mask ventilation without difficulty Laryngoscope Size: Miller and 2 Grade View: Grade I Tube type: Oral Tube size: 7.0 mm Number of attempts: 1 Airway Equipment and Method: Stylet Placement Confirmation: ETT inserted through vocal cords under direct vision,  positive ETCO2 and breath sounds checked- equal and bilateral Secured at: 22 cm Tube secured with: Tape Dental Injury: Teeth and Oropharynx as per pre-operative assessment

## 2015-03-23 NOTE — Transfer of Care (Signed)
Immediate Anesthesia Transfer of Care Note  Patient: Tara Rodgers  Procedure(s) Performed: Procedure(s): LATISSIMUS FLAP TO BREAST WITH PLACEMENT OF IMPLANT FOR BREAST RECONSTRUCTION (Right) RIGHT TOTAL MASTECTOMY (Right)  Patient Location: PACU  Anesthesia Type:General  Level of Consciousness: awake, alert , oriented and sedated  Airway & Oxygen Therapy: Patient Spontanous Breathing and Patient connected to face mask oxygen  Post-op Assessment: Report given to RN, Post -op Vital signs reviewed and stable and Patient moving all extremities  Post vital signs: Reviewed and stable  Last Vitals:  Filed Vitals:   03/23/15 0608  BP: 110/58  Pulse: 98  Temp: 36.7 C  Resp: 18    Complications: No apparent anesthesia complications

## 2015-03-23 NOTE — H&P (Signed)
History of Present Illness Marland Kitchen T. Greer Koeppen MD; 01/25/2015 3:53 PM) Patient words: breast cancer.  The patient is a 69 year old female who presents with breast cancer. She is well known to me from a previous history of cancer of the right breast treated 19 years ago with lumpectomy and radiation and axillary dissection. She gives a history of no adjuvant treatment. She has done well in follow-up with no problems until recently. On recent routine screening mammogram possible asymmetry was noted in the right breast. Left breast was negative. Initially diagnostic mammogram indicated some thickening in the skin of the lateral breast at the 12 o'clock position. Ultrasound was performed showing no definitive abnormality. She was referred on to breast MRI. This revealed an irregular rim enhancing mass within the superior right breast at 11:30 position measuring 2.5 x 2.4 x 2.4 cm. No abnormal appearing lymph nodes or other enhancing masses in either breast. Following this second look ultrasound was performed which at this point was able to confirm an approximately 2.5 cm hypoechoic mass and ultrasound guided core biopsy was performed. This has revealed invasive ductal carcinoma ER and PR positive Ki-67 20%, HER-2 negative. She is seen in the office for initial surgical treatment planning.   Other Problems Elbert Ewings, CMA; 01/25/2015 2:48 PM) Breast Cancer Gastroesophageal Reflux Disease High blood pressure Hypercholesterolemia Thyroid Disease  Past Surgical History Elbert Ewings, CMA; 01/25/2015 2:48 PM) Breast Biopsy Right. Breast Mass; Local Excision Right. Sentinel Lymph Node Biopsy  Diagnostic Studies History Elbert Ewings, [place]; 01/25/2015 2:48 PM) Colonoscopy never Mammogram within last year Pap Smear 1-5 years ago  Allergies Elbert Ewings, CMA; 01/25/2015 2:48 PM) No Known Drug Allergies04/13/2016  Medication History Elbert Ewings, CMA; 01/25/2015 2:51 PM) Xanax  (0.5MG Tablet, Oral) Active. Keflex (500MG Capsule, Oral) Active. Cipro XR (500MG Tablet ER 24HR, Oral) Active. Flexeril (10MG Tablet, Oral) Active. NexIUM (20MG Packet, Oral) Active. Allegra (60MG Capsule, Oral) Active. Synthroid (75MCG Tablet, Oral) Active. Protonix (40MG Packet, Oral) Active. Crestor (10MG Tablet, Oral) Active. Verapamil HCl (240MG (CO) Tablet ER, Oral) Active. Medications Reconciled  Social History Elbert Ewings, [place]; 01/25/2015 2:48 PM) Caffeine use Coffee. No alcohol use No drug use Tobacco use Never smoker.  Family History Elbert Ewings, [place]; 01/25/2015 2:48 PM) Hypertension Father, Mother.  Pregnancy / Birth History Elbert Ewings, [place]; 01/25/2015 2:48 PM) Age at menarche 33 years. Age of menopause <46 Gravida 2 Maternal age 73-20 Para 2  Review of Systems Elbert Ewings CMA; 01/25/2015 2:48 PM) General Not Present- Appetite Loss, Chills, Fatigue, Fever, Night Sweats, Weight Gain and Weight Loss. Skin Not Present- Change in Wart/Mole, Dryness, Hives, Jaundice, New Lesions, Non-Healing Wounds, Rash and Ulcer. HEENT Present- Ringing in the Ears. Not Present- Earache, Hearing Loss, Hoarseness, Nose Bleed, Oral Ulcers, Seasonal Allergies, Sinus Pain, Sore Throat, Visual Disturbances, Wears glasses/contact lenses and Yellow Eyes. Respiratory Not Present- Bloody sputum, Chronic Cough, Difficulty Breathing, Snoring and Wheezing. Cardiovascular Not Present- Chest Pain, Difficulty Breathing Lying Down, Leg Cramps, Palpitations, Rapid Heart Rate, Shortness of Breath and Swelling of Extremities. Gastrointestinal Not Present- Abdominal Pain, Bloating, Bloody Stool, Change in Bowel Habits, Chronic diarrhea, Constipation, Difficulty Swallowing, Excessive gas, Gets full quickly at meals, Hemorrhoids, Indigestion, Nausea, Rectal Pain and Vomiting. Female Genitourinary Not Present- Frequency, Nocturia, Painful Urination, Pelvic Pain and Urgency. Musculoskeletal  Not Present- Back Pain, Joint Pain, Joint Stiffness, Muscle Pain, Muscle Weakness and Swelling of Extremities. Neurological Not Present- Decreased Memory, Fainting, Headaches, Numbness, Seizures, Tingling, Tremor, Trouble walking and Weakness. Psychiatric Not Present-  Anxiety, Bipolar, Change in Sleep Pattern, Depression, Fearful and Frequent crying. Endocrine Not Present- Cold Intolerance, Excessive Hunger, Hair Changes, Heat Intolerance, Hot flashes and New Diabetes. Hematology Not Present- Easy Bruising, Excessive bleeding, Gland problems, HIV and Persistent Infections.   Vitals Elbert Ewings CMA; 01/25/2015 2:51 PM) 01/25/2015 2:51 PM Weight: 130 lb Height: 58in Body Surface Area: 1.55 m Body Mass Index: 27.17 kg/m Temp.: 98.29F(Oral)  Pulse: 113 (Regular)  Resp.: 17 (Unlabored)  BP: 138/72 (Sitting, Left Arm, Standard)    Physical Exam Marland Kitchen T. Dianah Pruett MD; 01/25/2015 3:54 PM) The physical exam findings are as follows: Note:General: Alert, well-developed and well nourished Caucasian female, in no distress Skin: Warm and dry without rash or infection. HEENT: No palpable masses or thyromegaly. Sclera nonicteric. Pupils equal round and reactive. Lymph nodes: No cervical, supraclavicular, or inguinal nodes palpable. Breasts: Healed lumpectomy incision of her right breast. Some post surgery and radiation shrinkage. There is a firm irregular mass measuring approximately 2.5 cm at around 12 o'clock position of the right breast. I cannot feel any other abnormalities in either breast. No apparent skin involvement or nipple changes. Lungs: Breath sounds clear and equal. No wheezing or increased work of breathing. Cardiovascular: Regular rate and rhythm without murmer. Abdomen: Nondistended. Soft and nontender. No masses palpable. No organomegaly. No palpable hernias. Extremities: No edema or joint swelling or deformity. No chronic venous stasis changes. Neurologic: Alert and  fully oriented. Gait normal. No focal weakness. Psychiatric: Normal mood and affect. Thought content appropriate with normal judgement and insight    Assessment & Plan Marland Kitchen T. Louretta Tantillo MD; 01/25/2015 3:57 PM) BREAST CANCER, RIGHT (174.9  C50.911) Impression: Likely new primary in previously treated breast status post lumpectomy and radiation and axillary dissection. I discussed with the patient and her family that she will require total mastectomy. I believe she would be a candidate for immediate reconstruction if she desires. She is interested and we will make a referral to plastic surgery. Refer to medical oncology. Refer to genetics as this is her second primary breast cancer. All this was discussed with her and questions answered. Final surgical plans will depend genetic testing and plastic surgery evaluation.     Signed by Edward Jolly, MD (01/25/2015 3:58 PM)

## 2015-03-24 ENCOUNTER — Encounter (HOSPITAL_COMMUNITY): Payer: Self-pay | Admitting: Plastic Surgery

## 2015-03-24 MED ORDER — METHOCARBAMOL 500 MG PO TABS
500.0000 mg | ORAL_TABLET | Freq: Four times a day (QID) | ORAL | Status: DC
  Administered 2015-03-24 – 2015-03-25 (×5): 500 mg via ORAL
  Filled 2015-03-24 (×5): qty 1

## 2015-03-24 NOTE — Progress Notes (Signed)
Patient ID: Tara Rodgers, female   DOB: 05-15-1946, 69 y.o.   MRN: [id number] 1 Day Post-Op  Subjective: Very sore but tolerable after pain meds.  No other C/O  Objective: Vital signs in last 24 hours: Temp:  [97 F (36.1 C)-98.6 F (37 C)] 98.6 F (37 C) (06/10 0540) Pulse Rate:  [91-106] 101 (06/10 0540) Resp:  [12-25] 18 (06/10 0540) BP: (109-123)/(51-69) 116/57 mmHg (06/10 0540) SpO2:  [89 %-100 %] 99 % (06/10 0540) Weight:  [58.06 kg (128 lb)] 58.06 kg (128 lb) (06/09 1520) Last BM Date: 03/22/15  Intake/Output from previous day: 06/09 0701 - 06/10 0700 In: 5166.7 [P.O.:240; I.V.:4576.7; IV Piggyback:350] Out: 2952.5 [Urine:2550; Drains:202.5; Blood:200] Intake/Output this shift:    General appearance: alert, cooperative and no distress Incision/Wound: Skin and lat flaps healthy without bleeding  Lab Results:  No results for input(s): WBC, HGB, HCT, PLT in the last 72 hours. BMET No results for input(s): NA, K, CL, CO2, GLUCOSE, BUN, CREATININE, CALCIUM in the last 72 hours.   Studies/Results: No results found.  Anti-infectives: Anti-infectives    Start     Dose/Rate Route Frequency Ordered Stop   03/23/15 1545  ceFAZolin (ANCEF) IVPB 1 g/50 mL premix     1 g 100 mL/hr over 30 Minutes Intravenous 3 times per day 03/23/15 1527     03/23/15 0730  bacitracin 50,000 Units, gentamicin (GARAMYCIN) 80 mg, ceFAZolin (ANCEF) 1 g in sodium chloride 0.9 % 1,000 mL      Irrigation Once 03/23/15 0721 03/23/15 0816   03/23/15 0645  ceFAZolin (ANCEF) IVPB 2 g/50 mL premix     2 g 100 mL/hr over 30 Minutes Intravenous To Surgery 03/22/15 1447 03/23/15 0754      Assessment/Plan: s/p Procedure(s): LATISSIMUS FLAP TO BREAST WITH PLACEMENT OF IMPLANT FOR BREAST RECONSTRUCTION RIGHT TOTAL MASTECTOMY Stable post op Discharge plans per Dr Harlow Mares   LOS: 1 day    Tara Rodgers T 03/24/2015

## 2015-03-24 NOTE — Progress Notes (Signed)
Pt. Complained of severe acid reflux, burping on and off and patient verbalized that  If she has a severe acid reflux she feel sick to her stomach. Dr. Harlow Mares was paged and made aware of patients symptoms. With orders made, protonix '40mg'$  x 1 dose given and also tums was ordered for breakthrough. Will continue to monitor patient.

## 2015-03-24 NOTE — Care Management Note (Signed)
Case Management Note  Patient Details  Name: Tara Rodgers MRN: [id number] Date of Birth: [birth year]  Subjective/Objective:    Pt adm on 03/23/15 s/p rt total mastectomy.  PTA, pt independent of ADLS.                 Action/Plan: Will follow for dc needs as pt progresses.    Expected Discharge Date:                  Expected Discharge Plan:  Home/Self Care  In-House Referral:     Discharge planning Services  CM Consult  Post Acute Care Choice:    Choice offered to:     DME Arranged:    DME Agency:     HH Arranged:    HH Agency:     Status of Service:  In process, will continue to follow  Medicare Important Message Given:    Date Medicare IM Given:    Medicare IM give by:    Date Additional Medicare IM Given:    Additional Medicare Important Message give by:     If discussed at [place] of Stay Meetings, dates discussed:    Additional Comments:  Reinaldo Raddle, RN, BSN  Trauma/Neuro ICU Case Manager [phone]

## 2015-03-24 NOTE — Progress Notes (Signed)
Subjective: Sore but good pain control. No mausea  Objective: Vital signs in last 24 hours: Temp:  [97 F (36.1 C)-98.6 F (37 C)] 98.6 F (37 C) (06/10 0540) Pulse Rate:  [91-106] 101 (06/10 0540) Resp:  [12-25] 18 (06/10 0540) BP: (109-123)/(51-69) 116/57 mmHg (06/10 0540) SpO2:  [89 %-100 %] 99 % (06/10 0540) Weight:  [128 lb (58.06 kg)] 128 lb (58.06 kg) (06/09 1520)  Intake/Output from previous day: 06/09 0701 - 06/10 0700 In: 5166.7 [P.O.:240; I.V.:4576.7; IV Piggyback:350] Out: 2952.5 [Urine:2550; Drains:202.5; Blood:200] Intake/Output this shift:    Operative sites: Mastectomy flaps viable. Flap viable wit good color throughout skin paddle. Implant in good position. Drains functioning. Drainage thin. No evidence of bleeding or infection.  No results for input(s): WBC, HGB, HCT, NA, K, CL, CO2, BUN, CREATININE, GLU in the last 72 hours.  Invalid input(s): PLATELETS  Studies/Results: No results found.  Assessment/Plan: Ambulate. Continue DVT and surgical prophylaxis.   LOS: 1 day    [name] M 03/24/2015 8:34 AM

## 2015-03-24 NOTE — Op Note (Signed)
NAME:  Tara Rodgers, Tara Rodgers NO.:  [id number]  MEDICAL RECORD NO.:  [id number]  LOCATION:  6N02C                        FACILITY:  [facility]  PHYSICIAN:  [name], M.D.     DATE OF BIRTH:  Dec 25, 1945  DATE OF PROCEDURE:  03/23/2015 DATE OF DISCHARGE:                              OPERATIVE REPORT   PREOPERATIVE DIAGNOSIS:  Right breast cancer.  POSTOPERATIVE DIAGNOSIS:  Right breast cancer.  PROCEDURE PERFORMED: 1. Right latissimus myocutaneous flap. 2. As a distinct procedure, immediate breast reconstruction with a     saline implant.  SURGEON:  [name], MD  ANESTHESIA:  General.  ESTIMATED BLOOD LOSS:  50 mL.  DRAINS:  Three 19-French, 2 into the back donor site, one right anterior chest.  CLINICAL NOTE:  A 69 year old woman had lumpectomy and radiation for breast cancer 20 years ago.  She has developed a new breast cancer just above the areola and mastectomy is recommended.  She is interested in reconstruction and due to the previous radiation, it was felt that latissimus flap implant was probably a better option than tissue expander.  She agreed with this and selected a saline implant rather than silicone gel.  The nature of this procedure and risks plus complications were discussed with her in detail.  These risks include, but not limited to, bleeding, infection, healing problems, scarring, loss of sensation, fluid accumulations, anesthesia related complications, loss of tissue, loss of portions or all of the flap, loss of portions of the mastectomy skin flaps, failure of device, capsular contracture, displacement of the device, wrinkles and ripples, asymmetry, contour deformities, contour deformities at the periphery of the reconstruction, pneumothorax, damage to deeper structures, and loss of range of motion, and or strength of the right arm and shoulder, and overall disappointment.  She understood all this and wished to proceed.  DESCRIPTION OF  PROCEDURE:  The patient was marked in the holding area in a full standing position.  She was in the operating room, and her general surgeon, Dr. [name] completed the mastectomy.  A lap soaked in antibiotic solution was placed in the mastectomy defect, and the wound stapled, closed, and covered with a sterile Steri-Drape.  The patient was then placed in the lateral decubitus position with the right side up and stabilized with a beanbag and an axillary roll was placed.  She was then prepped with ChloraPrep and draped with sterile drapes.  The skin paddle was incised.  The dissection was carried into subcutaneous tissue beveling superior and inferior in order to ensure broader attachment of soft tissue at the level of the muscle that was present at the level of skin.  This was done in order to ensure blood supply to the skin paddle. The dissection was continued medial, lateral, inferior, and superior to the limits of the muscle and the muscle was then divided inferiorly. Subcutaneous tunnel was also made to the anterior chest as well.  The muscle was then reflected in a cephalad direction and the dissection was continued using electrocautery.  Larger perforating vessels were either triple ligated with Ligaclips and divided or suture ligated with 3-0 Vicryl suture ligatures and divided.  The dissection was continued up  to the level of the subcutaneous tunnel with great care taken to avoid damage to the underlying thoracodorsal pedicle to the serratus branch as well.  The lap was then removed through the subcutaneous tunnel and antibiotic solution was placed and the flap was passed gently through this tunnel after ensuring that the flap had excellent color and bright red bleeding at the periphery of the skin paddle consistent with viability.  The defect was irrigated thoroughly with saline and meticulous hemostasis was achieved using electrocautery.  Two 19-French drains were positioned,  brought through separate stab wounds infero- anteriorly and secured with 3-0 Prolene sutures.  The closure with 0 PDS inverted deep sutures, 2-0 Monocryl interrupted inverted deep dermal sutures, and running 3-0 Monocryl subcuticular suture.  Dermabond and a dry sterile dressing was applied.  The drains were then dressed with Biopatch followed by SorbaView.  The patient was then placed into a supine position.  Once she was supine, the Steri-Drape was placed over the mastectomy wound, was removed and she was prepped with Betadine and draped with sterile drapes.  The skin staples were removed and the flap was then inspected and found to have excellent color and bright red bleeding at its periphery consistent viability.  The space was irrigated thoroughly with saline.  The flap was then inset beginning at the lateral aspect to close off the connection to the chest with 3-0 Vicryl sutures again with great care taken to avoid damage to underlying pedicle.  The muscle was inset along the inferior border and medially with 3-0 Vicryl interrupted sutures and then it was felt that the muscle was not quite large enough to cover the implant.  The pectoralis major muscle was then elevated from its lateral border in order to give adequate muscle coverage.  The implant was prepared.  It was soaked in antibiotic solution.  This was a Mentor 325 mL maximum fill saline implant.  After thoroughly cleaning gloves, the implant was prepared by placing 100 mL of sterile saline using closed filling system.  It was soaked in antibiotic solution. Again antibiotic solution was placed in the space.  A 3-0 Vicryl sutures were pre-placed to left and tied for the superior muscle closure and the implant was then positioned and filled with the maximum 325 mL.  The previously placed 3-0 Vicryl sutures were then tied securely and a 19- French drain was positioned, brought out through separate stab wound inferiorly and  secured with a 3-0 Prolene suture.  Flap continued to have excellent color.  The corners were then de-epithelialized as needed and there was bright red bleeding at both the ends of the skin paddle. The insetting of the skin paddle with 3-0 Monocryl inverted deep dermal sutures and few interrupted 3-0 Prolene simple sutures as needed, short run of 3-0 Monocryl subcuticular suture was used as well as indicated. Dermabond and dry sterile dressing were then applied and Biopatch and SorbaView for the drainage.  She was transferred to the recovery room in stable, having tolerated procedure well.     [name], M.D.     DB/MEDQ  D:  03/23/2015  T:  03/24/2015  Job:  [id number]

## 2015-03-25 MED ORDER — CEPHALEXIN 500 MG PO CAPS: 500.0000 mg | ORAL_CAPSULE | Freq: Two times a day (BID) | ORAL | Status: DC

## 2015-03-25 MED ORDER — METHOCARBAMOL 500 MG PO TABS: 500.0000 mg | ORAL_TABLET | Freq: Four times a day (QID) | ORAL | Status: DC | PRN

## 2015-03-25 MED ORDER — DOCUSATE SODIUM 100 MG PO CAPS: 100.0000 mg | ORAL_CAPSULE | Freq: Every day | ORAL | Status: DC

## 2015-03-25 MED ORDER — HYDROMORPHONE HCL 2 MG PO TABS: 2.0000 mg | ORAL_TABLET | ORAL | Status: DC | PRN

## 2015-03-25 NOTE — Discharge Instructions (Addendum)
No lifting for 6 weeks No vigorous activity for 6 weeks (including outdoor walks) No driving for 4 weeks OK to walk up stairs slowly Stay propped up Use incentive spirometer at home every hour while awake No shower while drains are in place Empty drains at least three times a day and record the amounts separately Take an over-the-counter Probiotic while on antibiotics Take an over-the-counter stool softener (such as Colace) while on pain medication For questions call [phone] Dr. Luvenia Heller will be covering for Dr. Harlow Mares beginning Monday See Dr. Harlow Mares in the office on Tuesday, June 21

## 2015-03-25 NOTE — Progress Notes (Signed)
Discharge home. Home discharge instruction with prescription given to patient. JP drain teaching done,patient able to show this writer how to care , empty drain.

## 2015-03-25 NOTE — Progress Notes (Signed)
2 Days Post-Op  Subjective: Sore. Had episode of nausea yesterday but resolved.   Objective: Vital signs in last 24 hours: Temp:  [98.2 F (36.8 C)-99 F (37.2 C)] 99 F (37.2 C) (06/11 0618) Pulse Rate:  [88-112] 102 (06/11 0618) Resp:  [17-18] 17 (06/11 0618) BP: (108-117)/(52-59) 114/59 mmHg (06/11 0618) SpO2:  [93 %-98 %] 94 % (06/11 0618) Last BM Date: 03/22/15  Intake/Output from previous day: 06/10 0701 - 06/11 0700 In: 1913.3 [P.O.:480; I.V.:1283.3; IV Piggyback:150] Out: 1865 [Urine:1650; Drains:215] Intake/Output this shift: Total I/O In: 240 [P.O.:240] Out: 350 [Urine:350]  Alert, nontoxic Sitting in chair cta Wound c/d/i; drains serous mainly  Lab Results:  No results for input(s): WBC, HGB, HCT, PLT in the last 72 hours. BMET No results for input(s): NA, K, CL, CO2, GLUCOSE, BUN, CREATININE, CALCIUM in the last 72 hours. PT/INR No results for input(s): LABPROT, INR in the last 72 hours. ABG No results for input(s): PHART, HCO3 in the last 72 hours.  Invalid input(s): PCO2, PO2  Studies/Results: No results found.  Anti-infectives: Anti-infectives    Start     Dose/Rate Route Frequency Ordered Stop   03/23/15 1545  ceFAZolin (ANCEF) IVPB 1 g/50 mL premix     1 g 100 mL/hr over 30 Minutes Intravenous 3 times per day 03/23/15 1527     03/23/15 0730  bacitracin 50,000 Units, gentamicin (GARAMYCIN) 80 mg, ceFAZolin (ANCEF) 1 g in sodium chloride 0.9 % 1,000 mL      Irrigation Once 03/23/15 0721 03/23/15 0816   03/23/15 0645  ceFAZolin (ANCEF) IVPB 2 g/50 mL premix     2 g 100 mL/hr over 30 Minutes Intravenous To Surgery 03/22/15 1447 03/23/15 0754      Assessment/Plan: s/p Procedure(s): LATISSIMUS FLAP TO BREAST WITH PLACEMENT OF IMPLANT FOR BREAST RECONSTRUCTION (Right) RIGHT TOTAL MASTECTOMY (Right)  Doing ok Dc plans per Dr Gentry Fitz. Redmond Pulling, MD, FACS General, Bariatric, & Minimally Invasive Surgery [facility], [place]   LOS: 2 days    Gayland Curry 03/25/2015

## 2015-03-25 NOTE — Discharge Summary (Signed)
Physician Discharge Summary  Patient ID: Tara Rodgers MRN: [id number] DOB/AGE: 68-Jun-1947 68 y.o.  Admit date: 03/23/2015 Discharge date: 03/25/2015  Admission Diagnoses: Right breast cancer  Discharge Diagnoses: Same  Active Problems:   Breast cancer, female   Discharged Condition: good  Hospital Course: On the day of admission the patient was taken to surgery and had right mastectomy, right latissimus flap, and immediate reconstruction with saline implant. The patient tolerated the procedures well. Postoperatively, the flap maintained excellent color and capillary refill. The patient was ambulatory and tolerating diet on the first postoperative day. DVT and surgical prophylaxis were continued.  Treatments: antibiotics: Ancef, anticoagulation: heparin and surgery: right mastectomy and reconstruction with latissimus flap and implant.  Discharge Exam: Blood pressure 115/54, pulse 100, temperature 98.6 F (37 C), temperature source Oral, resp. rate 16, height '4\' 10"'$  (1.473 m), weight 128 lb (58.06 kg), SpO2 95 %.  Operative sites: Mastectomy flaps viable. Flap viable. Good color throughout skin paddle. Implant in good position. Drains functioning. Drainage thin. There is no evidence of bleeding or infection at either operative site.  Disposition: Discharge home.     Medication List    STOP taking these medications        cyclobenzaprine 10 MG tablet  Commonly known as:  FLEXERIL     fexofenadine 60 MG tablet  Commonly known as:  ALLEGRA     ibuprofen 200 MG tablet  Commonly known as:  ADVIL,MOTRIN      TAKE these medications        ALPRAZolam 0.5 MG tablet  Commonly known as:  XANAX  Take 1 tablet (0.5 mg total) by mouth 2 (two) times daily as needed.     Calcium-Magnesium-Zinc 167-83-8 MG Tabs  Take 1 tablet by mouth daily.     cephALEXin 500 MG capsule  Commonly known as:  KEFLEX  Take 1 capsule (500 mg total) by mouth every 12 (twelve) hours.     docusate  sodium 100 MG capsule  Commonly known as:  COLACE  Take 1 capsule (100 mg total) by mouth daily.     HYDROmorphone 2 MG tablet  Commonly known as:  DILAUDID  Take 1 tablet (2 mg total) by mouth every 4 (four) hours as needed for moderate pain.     levothyroxine 50 MCG tablet  Commonly known as:  SYNTHROID, LEVOTHROID  Take 1 tablet (50 mcg total) by mouth daily.     methocarbamol 500 MG tablet  Commonly known as:  ROBAXIN  Take 1 tablet (500 mg total) by mouth every 6 (six) hours as needed for muscle spasms.     pantoprazole 40 MG tablet  Commonly known as:  PROTONIX  Take 1 tablet (40 mg total) by mouth daily.     rosuvastatin 20 MG tablet  Commonly known as:  CRESTOR  Take 20 mg by mouth daily.     verapamil 240 MG CR tablet  Commonly known as:  CALAN-SR  Take 1 tablet (240 mg total) by mouth daily.         SignedHarlow Mares, Dessie Tatem M 03/25/2015, 1:05 PM

## 2015-03-27 ENCOUNTER — Telehealth: Payer: Self-pay | Admitting: *Deleted

## 2015-03-27 NOTE — Telephone Encounter (Signed)
Received order for oncotype testing. Requisition sent to pathology. Received by Christy. 

## 2015-03-30 ENCOUNTER — Other Ambulatory Visit: Payer: Medicare HMO | Admitting: Internal Medicine

## 2015-03-31 ENCOUNTER — Ambulatory Visit: Payer: Medicare HMO | Admitting: Internal Medicine

## 2015-04-03 ENCOUNTER — Encounter (HOSPITAL_COMMUNITY): Payer: Self-pay

## 2015-04-03 ENCOUNTER — Other Ambulatory Visit: Payer: Self-pay | Admitting: Hematology

## 2015-04-06 ENCOUNTER — Telehealth: Payer: Self-pay | Admitting: *Deleted

## 2015-04-06 ENCOUNTER — Telehealth: Payer: Self-pay | Admitting: Internal Medicine

## 2015-04-06 MED ORDER — SIMVASTATIN 20 MG PO TABS: 20.0000 mg | ORAL_TABLET | Freq: Every day | ORAL | Status: DC

## 2015-04-06 MED ORDER — CYCLOBENZAPRINE HCL 10 MG PO TABS: 10.0000 mg | ORAL_TABLET | Freq: Every day | ORAL | Status: DC

## 2015-04-06 NOTE — Telephone Encounter (Signed)
Spoke with patient; advised that med has been called to her pharmacy.  Gave her fasting lab appointment for 9/15 (Lipid, Liver) and 3 month f/u appointment for Friday, 9/16 @ 2:45 (she'll be on vacation the w/o 9/19, so she wanted to be seen prior to vacation).

## 2015-04-06 NOTE — Telephone Encounter (Signed)
Flexeril script called in to pharmacy

## 2015-04-06 NOTE — Telephone Encounter (Signed)
Please prescribe Flexeril 10 mg hs #30 with 2 refills

## 2015-04-06 NOTE — Telephone Encounter (Signed)
Patient currently on Crestor '20mg'$  and wants to be switched over to Zocor due to the cost of generic Crestor (it is costing her $60).  States that she's willing to come for additional office visits and titrating of the Zocor to get her to the correct level of Zocor.  Patient understands that this may take a few visits to get this correct.  Advised that I spoke with Dr. Renold Genta and we are ok to switch her, but we WILL have to have office visits to follow up on her titration of the drug to insure we are making correct progression.  Patient verbalized understanding.

## 2015-04-06 NOTE — Telephone Encounter (Signed)
Patient would like a script for flexeril she states she has been on this. Its currently not on her med list. Please advise

## 2015-04-06 NOTE — Telephone Encounter (Signed)
Start Zocor 20 mg daily and follow up in 3 months with OV and lipid liver panels

## 2015-04-06 NOTE — Telephone Encounter (Signed)
Sent script for Zocor 20 mg to patient pharmacy

## 2015-04-12 ENCOUNTER — Ambulatory Visit (HOSPITAL_BASED_OUTPATIENT_CLINIC_OR_DEPARTMENT_OTHER): Payer: Medicare HMO | Admitting: Hematology

## 2015-04-12 ENCOUNTER — Encounter: Payer: Self-pay | Admitting: Hematology

## 2015-04-12 ENCOUNTER — Telehealth: Payer: Self-pay | Admitting: Hematology

## 2015-04-12 VITALS — BP 112/64 | HR 112 | Temp 98.3°F | Resp 18 | Ht <= 58 in | Wt 127.2 lb

## 2015-04-12 DIAGNOSIS — C50811 Malignant neoplasm of overlapping sites of right female breast: Secondary | ICD-10-CM

## 2015-04-12 DIAGNOSIS — C50911 Malignant neoplasm of unspecified site of right female breast: Secondary | ICD-10-CM

## 2015-04-12 DIAGNOSIS — M810 Age-related osteoporosis without current pathological fracture: Secondary | ICD-10-CM

## 2015-04-12 NOTE — Progress Notes (Signed)
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) and radiation ([name]).   01/11/2015 Imaging breast MRI showed an irregular rim enhancing mass within the superior right breast at 11:30 measuring 2.5 x 2.4 x 2.4 cm, no other lesion or adenopathy.    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    01/12/2015 Receptors her2 ER 100% positive, PR 77% positive, HER-2 negative, Ki-67 20%   01/12/2015 Mammogram Diagnostic mammogram and ultrasound showed a 2.3 cm mass in the upper midline right breast. No axillary adenopathy.    HISTORY OF PRESENTING ILLNESS:  Tara LANGELIER 69 y.o. female is here because of recently diagnosed right breast cancer.  She had right breast cancer in 1996, which was treated with lumpectomy and axillary lymph node dissection, followed by adjuvant chemotherapy and radiation.   She did not have (/need) adjuvant endocrine therapy.  She has been followed with annual mammograms since then.  Her routine screening mammograms on 12/13/2014 showed a possible asymmetrically in the right breast. She underwent diagnostic mammogram and ultrasound on 12/16/2014  which showed a suspicious irregular subcutaneous density within the right breast, no definitive sonographic correlation was identified. She subsequently underwent breast MRI on 01/11/2015, which showed a rim enhancing mass within the right breast at 11:30 position measuring 2.5 x 2.4 x 2.4 cm. No other lesion or adenopathy. She underwent core biopsy on 01/12/2015, which showed invasive ductal carcinoma, ER positive, PR positive, HER-2 negative, Ki-67 20%.  She feels well, did not feel any palpable mass before the screening mammogram. She denies any pain, dyspnea, fatigue or any other symptoms.   INTERIM HISTORY Kariah returns for follow up. She underwent right simple mastectomy and reconstruction on 03/23/2015. She tolerated the surgery well, had her last drainage tube removed last week. She is off pain medication now. She has good energy level and eating well. No other new complaints.  MEDICAL HISTORY:  Past Medical History  Diagnosis Date  . Breast cancer 1995/2016    right sided breast cancer in 08/1994  . Hypertension   . Asthma     does not take any medications, hx of inhaler use  . Bronchitis     hx of  . Hyperthyroidism   . Anxiety   . GERD (gastroesophageal reflux disease)   . History of hiatal hernia     SURGICAL HISTORY: Past Surgical History  Procedure Laterality Date  . Carpel tunnel  Bilateral   . Incontinence surgery      with mesh insertion about 7 years ago  . Tonsillectomy      as a child  . Mastectomy Right 03/23/2015  . Reconstruction breast w/  latissimus dorsi flap Right 03/23/2015  . Latissimus flap to breast Right 03/23/2015    Procedure: LATISSIMUS FLAP TO BREAST WITH PLACEMENT OF IMPLANT FOR BREAST RECONSTRUCTION;  Surgeon: [name], MD;  Location: [facility];  Service: Plastics;  Laterality: Right;  . Total mastectomy Right 03/23/2015    Procedure: RIGHT TOTAL MASTECTOMY;  Surgeon: [name], MD;  Location: [facility];  Service: General;  Laterality: Right;     SOCIAL HISTORY: History   Social History  . Marital Status: Married    Spouse Name: N/A  . Number of Children: 2  . Years of Education: N/A   Occupational History  . Not on file.   Social History Main Topics  . Smoking status: Never Smoker   . Smokeless tobacco: Never Used  . Alcohol Use: Yes     Comment: occasional  . Drug Use: No  . Sexual Activity: Not on file   Other Topics Concern  . Not on file   Social History Narrative   GYN HISTORY  Menarchal: 69 yo  LMP: 1990 Contraceptive:no    HRT: one year in [place]: Family History  Problem Relation Age of Onset  . COPD Father   . Heart disease Father     ALLERGIES:  has No Known Allergies.  MEDICATIONS:  Current Outpatient Prescriptions  Medication Sig Dispense Refill  . ALPRAZolam (XANAX) 0.5 MG tablet Take 1 tablet (0.5 mg total) by mouth 2 (two) times daily as needed. 60 tablet 5  . Calcium-Magnesium-Zinc 167-83-8 MG TABS Take 1 tablet by mouth daily.    . cephALEXin (KEFLEX) 500 MG capsule Take 1 capsule (500 mg total) by mouth every 12 (twelve) hours. 20 capsule 1  . cyclobenzaprine (FLEXERIL) 10 MG tablet Take 1 tablet (10 mg total) by mouth at bedtime. 30 tablet 2  . docusate sodium (COLACE) 100 MG capsule Take 1 capsule (100 mg total) by mouth daily. 20 capsule 0  . levothyroxine (SYNTHROID, LEVOTHROID) 50 MCG tablet Take 1 tablet (50 mcg total) by mouth daily. 30 tablet 2  . methocarbamol (ROBAXIN) 500 MG tablet Take 1 tablet (500 mg total) by mouth every 6 (six) hours as needed for muscle spasms. 40 tablet 1  . pantoprazole (PROTONIX) 40 MG tablet Take 1 tablet (40 mg total) by mouth daily. 30 tablet 11  . simvastatin (ZOCOR) 20 MG tablet Take 1 tablet (20 mg total) by mouth at bedtime. 90 tablet 1  . verapamil (CALAN-SR) 240 MG CR tablet Take 1 tablet (240 mg total) by mouth daily. (Patient taking differently: Take 120 mg by mouth daily. ) 30 tablet 11  . HYDROmorphone (DILAUDID)  2 MG tablet Take 1 tablet (2 mg total) by mouth every 4 (four) hours as needed for moderate pain. (Patient not taking: Reported on 04/12/2015) 50 tablet 0   No current facility-administered medications for this visit.    REVIEW OF SYSTEMS:   Constitutional: Denies fevers, chills or abnormal night sweats Eyes: Denies blurriness of vision, double vision or watery eyes Ears, nose, mouth, throat, and face: Denies mucositis or sore throat Respiratory: Denies cough, dyspnea or wheezes Cardiovascular: Denies palpitation, chest discomfort or lower extremity swelling Gastrointestinal:  Denies nausea, heartburn or change in bowel habits Skin: Denies abnormal skin rashes Lymphatics: Denies new lymphadenopathy or easy bruising Neurological:Denies numbness, tingling or new weaknesses Behavioral/Psych: Mood is stable, no new changes  All other systems were reviewed with the patient and are negative.  PHYSICAL EXAMINATION: ECOG PERFORMANCE STATUS: 0 -  Asymptomatic  Filed Vitals:   04/12/15 1201  BP: 112/64  Pulse: 112  Temp: 98.3 F (36.8 C)  Resp: 18   Filed Weights   04/12/15 1201  Weight: 127 lb 3.2 oz (57.698 kg)    GENERAL:alert, no distress and comfortable SKIN: skin color, texture, turgor are normal, no rashes or significant lesions EYES: normal, conjunctiva are pink and non-injected, sclera clear OROPHARYNX:no exudate, no erythema and lips, buccal mucosa, and tongue normal  NECK: supple, thyroid normal size, non-tender, without nodularity LYMPH:  no palpable lymphadenopathy in the cervical, axillary or inguinal LUNGS: clear to auscultation and percussion with normal breathing effort HEART: regular rate & rhythm and no murmurs and no lower extremity edema ABDOMEN:abdomen soft, non-tender and normal bowel sounds Musculoskeletal:no cyanosis of digits and no clubbing  PSYCH: alert & oriented x 3 with fluent speech NEURO: no focal motor/sensory deficits Breasts: Status post right  simple mastectomy and reconstruction. Surgical scar is healing well. Palpation of the left breasts and axilla revealed no obvious mass that I could appreciate.   LABORATORY DATA:  I have reviewed the data as listed Lab Results  Component Value Date   WBC 9.5 03/17/2015   HGB 12.7 03/17/2015   HCT 38.4 03/17/2015   MCV 87.3 03/17/2015   PLT 260 03/17/2015    Recent Labs  07/14/14 0941 03/17/15 1351  NA 141 141  K 4.8 4.4  CL 103 106  CO2 24 26  GLUCOSE 85 83  BUN 19 15  CREATININE 1.04 1.03*  CALCIUM 9.3 9.1  GFRNONAA  --  55*  GFRAA  --  >60  PROT 6.6  --   ALBUMIN 4.2  --   AST 20  --   ALT 22  --   ALKPHOS 75  --   BILITOT 0.4  --    PATHOLOGY REPORT" Diagnosis 03/23/2015 Breast, simple mastectomy, right - INVASIVE ADENOCARCINOMA, SEE COMMENT. - PREVIOUS BIOPSY SITE PRESENT. - SEE TUMOR SYNOPTIC TEMPLATE BELOW. Microscopic Comment BREAST, INVASIVE TUMOR, WITHOUT LYMPH NODES PRESENT Specimen, including laterality: Right breast without lymph node sampling. Procedure: Simple mastectomy. Histologic type: Ductal. Grade: 3 of 3. Tubule formation: 3. Nuclear pleomorphism: 3. Mitotic: 2. Tumor size (gross measurement): 2.9 cm. Invasive, distance to closest margin: 0.5 cm (posterior). In-situ, distance to closest margin: N/A. If margin positive, focally or broadly: N/A. Lymphovascular invasion: Absent. Ductal carcinoma in situ: Absent. Grade: N/A. Extensive intraductal component: N/A. Lobular neoplasia: Absent. Tumor focality: Unifocal. Treatment effect: None. If present, treatment effect in breast tissue, lymph nodes or both: N/A. Extent of tumor: Skin: N/A. Nipple: N/A. Skeletal muscle: Negative for tumor.  Estrogen receptor: Not repeated, previous study demonstrated 100% positivity ([id number]). Progesterone receptor: Not repeated, previous study demonstrated 77% proliferation rate ([id number]). Her 2 neu: Repeated, previous study demonstrated no  amplification (1.90) ([id number]). Ki-67: Not repeated, previous study demonstrated 20% proliferation rate ([id number]). Non-neoplastic breast: Previous biopsy site, fibrocytic change, coarse vascular calcifications. TNM: pT2, pNX, pMX.  ONCOTYPE RS 8  RADIOGRAPHIC STUDIES: I have personally reviewed the radiological images as listed and agreed with the findings in the report.   Mr Breast Bilateral W Wo Contrast 01/11/2015     FINDINGS: Breast composition: d. Extreme fibroglandular tissue.  Background parenchymal enhancement: Marked background enhancement is noted within the left breast, similar to prior studies from 2009. There is minimal background enhancement within the right breast.  Right breast: There is an irregular rim enhancing mass within the superior right breast at 11:30 measuring 2.5 x 2.4  x 2.4 cm. No abnormal enhancement is identified within the skin of the superior right breast in the area of concern on mammogram.  Left breast: Marked background enhancement, similar to prior studies. No dominant suspicious area of enhancement is identified.  Lymph nodes: No abnormal appearing lymph nodes.  Ancillary findings:  None.   IMPRESSION: Highly suspicious irregular enhancing mass within the superior right breast at 11:30.  RECOMMENDATION: Right breast second-look ultrasound. Ultrasound-guided biopsy is recommended. If no sonographic correlate is identified, MRI guided biopsy is recommended.  BI-RADS CATEGORY  5: Highly suggestive of malignancy.   Electronically Signed   By: Pamelia Hoit M.D.   On: 01/11/2015 13:08   US Breast Ltd Uni Right Inc Axilla 01/12/2015   IMPRESSION: A 2.3 cm diameter mass in the upper midline right breast the suspicious.  RECOMMENDATION: Recommend ultrasound-guided core needle biopsy. This will be performed later today.  I have discussed the findings and recommendations with the patient. Results were also provided in writing at the conclusion of the visit. If applicable,  a reminder letter will be sent to the patient regarding the next appointment.  BI-RADS CATEGORY  4: Suspicious abnormality - biopsy should be considered.   Electronically Signed   By: Andres Shad   On: 01/12/2015 16:13    ASSESSMENT & PLAN:  69 year old female, with remote history of right breast cancer in 1996, status post lumpectomy with lymph node dissection, adjuvant chemotherapy and irradiation. She was found to have a right breast mass by screening mammogram.  1. Recurrent right breast invasive ductal carcinoma, pT2N0, stage II, grade 3 that you with your, ER positive, PR positive, HER-2 negative -I discussed her scan and biopsy findings in great details with the patient and her family. -Giving this is a recurrent cancer in the same breast, she would need a mastectomy. She has seen Dr. Brantley Stage, and agreed to proceed with the surgery. -Giving her prior breast cancer was 20 years ago, this is likely a secondary primary. She clinically feels well, I do not think  she needs restaging scan to ruled out for metastasis. -I reviewed her surgical path and oncotype results with her. Her recurrence score is 8, which is low risk, Predicts 6% of 10 year risk of distant recurrence with tamoxifen alone. I did not recommend adjuvant chemotherapy. -Her tumor is strongly ER/PR positivity, I recommend adjuvant endocrine therapy with aromatase inhibitor to reduce the risk of cancer recurrence. Potential side effects were discussed with patient, including hot flash, skin and the vagina dryness, muscular joint discomfort, hyperlipidemia, cataracts, cardiovascular risks, and osteopenia/osteoporosis, etc. She agrees to proceed. I sent a prescription of letrozole to her pharmacy today. Written material was given to her. -I recommend her to start letrozole 2 weeks later.  -We also discussed surveillance plan after her surgery, including annual mammogram, routine physical exam and lab.  2. Osteoporosis -She has known  history of osteoporosis. Last bone density scan in June 2009 showed T score -3.0. -She tried Fosamax before, had severe bone pain, and the refused to try again. I recommend her to try prolia, which works slightly different than Fosamax, but she declined. -I discussed that letrozole is likely going to make her osteopenia worse, she had a last bone scan in 2015, for follow-up every 2 years. -I strongly recommend her take calcium 1 g, vitamin D at least 1000 units daily, and weight-bearing exercise distress or bone. -We'll check her vitamin D level -If her bone scan shows worsening T score, I'll  consider switching her to tamoxifen.  Plan -Start letrozole in 2 weeks, I sent a prescription to her pharmacy today -I'll obtain her recent bone density scan results -Return to clinic in 2 months for follow-up with lab.   All questions were answered. The patient knows to call the clinic with any problems, questions or concerns. I spent 30 minutes counseling the patient face to face. The total time spent in the appointment was 40 minutes and more than 50% was on counseling.     [name], MD 04/12/2015 12:33 PM

## 2015-04-12 NOTE — Telephone Encounter (Signed)
Gave adn printed appt sched and avs for pt Aug

## 2015-04-14 ENCOUNTER — Telehealth: Payer: Self-pay | Admitting: *Deleted

## 2015-04-14 NOTE — Telephone Encounter (Signed)
Oncology Nurse Navigator Documentation  Oncology Nurse Navigator Flowsheets 03/27/2015 04/14/2015  Navigator Encounter Type Other Telephone  Patient Visit Type Medonc -  Treatment Phase Other -  Barriers/Navigation Needs - No barriers at this time  Time Spent with Patient 15 30   Spoke to pt to assess needs post op. Relate she is doing better. Had a difficult time with recovery. Gave emotional support and encouragement. Denied questions or concerns at this time. Encourage pt to call with needs. Received verbal understanding.

## 2015-04-21 ENCOUNTER — Telehealth: Payer: Self-pay | Admitting: Internal Medicine

## 2015-04-21 MED ORDER — ALPRAZOLAM 0.5 MG PO TABS: 0.5000 mg | ORAL_TABLET | Freq: Two times a day (BID) | ORAL | Status: DC | PRN

## 2015-04-21 NOTE — Telephone Encounter (Signed)
Xanax refilled.  

## 2015-04-21 NOTE — Telephone Encounter (Signed)
Refill x 6 months 

## 2015-04-21 NOTE — Telephone Encounter (Signed)
Needs refill on Xanax.  States she has called [facility]) and requested refill but they always tell her to call us.

## 2015-05-02 ENCOUNTER — Telehealth: Payer: Self-pay

## 2015-05-02 ENCOUNTER — Other Ambulatory Visit: Payer: Self-pay | Admitting: Internal Medicine

## 2015-05-02 NOTE — Telephone Encounter (Signed)
Patient informed that her flexeril had been refilled 04/06/2015.  Patient needs to call and speak to pharmacy to get the to refill this from a hold.  Patient also informed that her thyroid medication has been refilled.

## 2015-05-04 ENCOUNTER — Ambulatory Visit (INDEPENDENT_AMBULATORY_CARE_PROVIDER_SITE_OTHER): Payer: Medicare HMO | Admitting: Internal Medicine

## 2015-05-04 ENCOUNTER — Other Ambulatory Visit: Payer: Self-pay | Admitting: Hematology

## 2015-05-04 ENCOUNTER — Encounter: Payer: Self-pay | Admitting: Internal Medicine

## 2015-05-04 ENCOUNTER — Telehealth: Payer: Self-pay | Admitting: *Deleted

## 2015-05-04 VITALS — BP 136/84 | HR 112 | Temp 98.4°F | Ht <= 58 in | Wt 131.0 lb

## 2015-05-04 DIAGNOSIS — C50911 Malignant neoplasm of unspecified site of right female breast: Secondary | ICD-10-CM | POA: Diagnosis not present

## 2015-05-04 DIAGNOSIS — M810 Age-related osteoporosis without current pathological fracture: Secondary | ICD-10-CM

## 2015-05-04 DIAGNOSIS — F411 Generalized anxiety disorder: Secondary | ICD-10-CM

## 2015-05-04 DIAGNOSIS — I10 Essential (primary) hypertension: Secondary | ICD-10-CM

## 2015-05-04 MED ORDER — LETROZOLE 2.5 MG PO TABS: 2.5000 mg | ORAL_TABLET | Freq: Every day | ORAL | Status: DC

## 2015-05-04 NOTE — Progress Notes (Signed)
   Subjective:    Patient ID: Tara Rodgers, female    DOB: 08/17/1946, 69 y.o.   MRN: [id number]  HPI  Her oncologist, Dr. Feng is recommended that she be placed on Femara. Tumor was 2.9 cm in size. Lymph nodes were negative. Tumor is estrogenic progesterone receptor positive. It is HER-2/neu negative. I fully agree with that recommendation. Patient wanted to see what I thought about it. She is concerned about hot flashes. I think this medication is beneficial to her and she shouldn't take it.  She also has a history of osteoporosis dating back to 2009 with bone density study done then by Dr. Stern. She's has had a recent bone density study at some was in November 2015. We have requested those results. She saw Dr. Bassett and was recommended to take Prolia. She is unable to tolerate Fosamax. She says it makes her ache all  over.  Had new tumor diagnosed recently in right breast. Underwent excision and reconstruction surgery. Had previous tumor right breast 1996. Oncology thought that this new tumor is probably a new primary.    Review of Systems     Objective:   Physical Exam Spent 15 minutes speaking with her about these concerns and reviewing her tumor pathology with her as well as bone density study from 2009.       Assessment & Plan:  Invasive intraductal carcinoma of same breast- right--- see staging Osteoporosis  Anxiety  Essential hypertension  Plan: Encourage patient to take Femara as recommended by oncologist. Also Prolia recommended as well   

## 2015-05-04 NOTE — Telephone Encounter (Signed)
I e-scripted.  Tara Rodgers  05/04/2015

## 2015-05-04 NOTE — Telephone Encounter (Signed)
TC from patient who states that her pharmacy has not received order for her Letrozole. She was to start taking it on 04/26/15.  She uses Product/process development scientist @ [facility]

## 2015-05-04 NOTE — Patient Instructions (Signed)
Take Femara as directed.  Take Prolia injections as recommended by Dr. Layne Benton.

## 2015-05-18 ENCOUNTER — Other Ambulatory Visit: Payer: Medicare HMO | Admitting: Internal Medicine

## 2015-06-13 ENCOUNTER — Encounter: Payer: Self-pay | Admitting: Hematology

## 2015-06-13 ENCOUNTER — Ambulatory Visit (HOSPITAL_BASED_OUTPATIENT_CLINIC_OR_DEPARTMENT_OTHER): Payer: Medicare HMO | Admitting: Hematology

## 2015-06-13 ENCOUNTER — Telehealth: Payer: Self-pay | Admitting: Hematology

## 2015-06-13 ENCOUNTER — Other Ambulatory Visit (HOSPITAL_BASED_OUTPATIENT_CLINIC_OR_DEPARTMENT_OTHER): Payer: Medicare HMO

## 2015-06-13 VITALS — BP 125/68 | HR 103 | Temp 98.7°F | Resp 17 | Ht <= 58 in | Wt 131.3 lb

## 2015-06-13 DIAGNOSIS — Z170 Estrogen receptor positive status [ER+]: Secondary | ICD-10-CM | POA: Diagnosis not present

## 2015-06-13 DIAGNOSIS — M810 Age-related osteoporosis without current pathological fracture: Secondary | ICD-10-CM | POA: Diagnosis not present

## 2015-06-13 DIAGNOSIS — C50911 Malignant neoplasm of unspecified site of right female breast: Secondary | ICD-10-CM

## 2015-06-13 DIAGNOSIS — C50811 Malignant neoplasm of overlapping sites of right female breast: Secondary | ICD-10-CM

## 2015-06-13 LAB — COMPREHENSIVE METABOLIC PANEL (CC13)
ALT: 16 U/L (ref 0–55)
ANION GAP: 10 meq/L (ref 3–11)
AST: 16 U/L (ref 5–34)
Albumin: 3.8 g/dL (ref 3.5–5.0)
Alkaline Phosphatase: 71 U/L (ref 40–150)
BILIRUBIN TOTAL: 0.28 mg/dL (ref 0.20–1.20)
BUN: 15.3 mg/dL (ref 7.0–26.0)
CO2: 26 mEq/L (ref 22–29)
CREATININE: 1.1 mg/dL (ref 0.6–1.1)
Calcium: 9.2 mg/dL (ref 8.4–10.4)
Chloride: 108 mEq/L (ref 98–109)
EGFR: 51 mL/min/{1.73_m2} — AB (ref >90)
Glucose: 98 mg/dl (ref 70–140)
Potassium: 4.3 mEq/L (ref 3.5–5.1)
Sodium: 143 mEq/L (ref 136–145)
TOTAL PROTEIN: 6.8 g/dL (ref 6.4–8.3)

## 2015-06-13 LAB — CBC WITH DIFFERENTIAL/PLATELET
BASO%: 0.1 % (ref 0.0–2.0)
Basophils Absolute: 0 10*3/uL (ref 0.0–0.1)
EOS ABS: 0.4 10*3/uL (ref 0.0–0.5)
EOS%: 4.6 % (ref 0.0–7.0)
HCT: 37.3 % (ref 34.8–46.6)
HGB: 12.4 g/dL (ref 11.6–15.9)
LYMPH%: 27.5 % (ref 14.0–49.7)
MCH: 28.7 pg (ref 25.1–34.0)
MCHC: 33.2 g/dL (ref 31.5–36.0)
MCV: 86.5 fL (ref 79.5–101.0)
MONO#: 0.6 10*3/uL (ref 0.1–0.9)
MONO%: 6.8 % (ref 0.0–14.0)
NEUT#: 5 10*3/uL (ref 1.5–6.5)
NEUT%: 61 % (ref 38.4–76.8)
PLATELETS: 258 10*3/uL (ref 145–400)
RBC: 4.32 10*6/uL (ref 3.70–5.45)
RDW: 14.5 % (ref 11.2–14.5)
WBC: 8.3 10*3/uL (ref 3.9–10.3)
lymph#: 2.3 10*3/uL (ref 0.9–3.3)

## 2015-06-13 NOTE — Progress Notes (Signed)
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) and radiation ([name]).   01/11/2015 Imaging breast MRI showed an irregular rim enhancing mass within the superior right breast at 11:30 measuring 2.5 x 2.4 x 2.4 cm, no other lesion or adenopathy.    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    01/12/2015 Receptors her2 ER 100% positive, PR 77% positive, HER-2 negative, Ki-67 20%   01/12/2015 Mammogram Diagnostic mammogram and ultrasound showed a 2.3 cm mass in the upper midline right breast. No axillary adenopathy.   03/23/2015 Surgery right simple mastectomy and reconstruction, surgical margins were negative.   05/04/2015 -  Anti-estrogen oral therapy letrozole 2.56m daily     HISTORY OF PRESENTING ILLNESS:  Tara KNIGHT69y.o. female is here because of recently diagnosed right breast cancer.  She had right breast cancer in 1996, which was treated with lumpectomy and axillary lymph node dissection, followed by adjuvant chemotherapy and radiation.   She did not have (/need) adjuvant endocrine therapy.  She has been followed with annual mammograms since then.  Her routine screening mammograms  on 12/13/2014 showed a possible asymmetrically in the right breast. She underwent diagnostic mammogram and ultrasound on 12/16/2014 which showed a suspicious irregular subcutaneous density within the right breast, no definitive sonographic correlation was identified. She subsequently underwent breast MRI on 01/11/2015, which showed a rim enhancing mass within the right breast at 11:30 position measuring 2.5 x 2.4 x 2.4 cm. No other lesion or adenopathy. She underwent core biopsy on 01/12/2015, which showed invasive ductal carcinoma, ER positive, PR positive, HER-2 negative, Ki-67 20%.  She feels well, did not feel any palpable mass before the screening mammogram. She denies any pain, dyspnea, fatigue or any other symptoms.   CURRENT THERAPY:   Letrozole 2.5 mg once daily, started on 05/04/2015.  INTERIM HISTORY JLaurieannreturns for follow up. She  Has been on letrozole for a month,  And tolerating well. She doesn't have  Mild tomoderate hot flash, usually at night,  Does not wake her up, she feels it's manageable.  Mild joint  discomfort, no other new complaints.  She otherwise is doing very well.  MEDICAL HISTORY:  Past Medical History  Diagnosis Date  . Breast cancer 1995/2016    right sided breast cancer in 08/1994  . Hypertension   . Asthma     does not take any medications, hx of inhaler use  . Bronchitis     hx of  . Hyperthyroidism   . Anxiety   . GERD (gastroesophageal reflux disease)   . History of hiatal hernia     SURGICAL HISTORY: Past Surgical History  Procedure Laterality  Date  . Carpel tunnel  Bilateral   . Incontinence surgery      with mesh insertion about 7 years ago  . Tonsillectomy      as a child  . Mastectomy Right 03/23/2015  . Reconstruction breast w/ latissimus dorsi flap Right 03/23/2015  . Latissimus flap to breast Right 03/23/2015    Procedure: LATISSIMUS FLAP TO BREAST WITH PLACEMENT OF IMPLANT FOR BREAST RECONSTRUCTION;  Surgeon: [name], MD;  Location:  [facility];  Service: Plastics;  Laterality: Right;  . Total mastectomy Right 03/23/2015    Procedure: RIGHT TOTAL MASTECTOMY;  Surgeon: [name], MD;  Location: [facility];  Service: General;  Laterality: Right;    SOCIAL HISTORY: Social History   Social History  . Marital Status: Married    Spouse Name: N/A  . Number of Children: 2  . Years of Education: N/A   Occupational History  . Not on file.   Social History Main Topics  . Smoking status: Never Smoker   . Smokeless tobacco: Never Used  . Alcohol Use: Yes     Comment: occasional  . Drug Use: No  . Sexual Activity: Not on file   Other Topics Concern  . Not on file   Social History Narrative   GYN HISTORY  Menarchal: 69 yo  LMP: 1990 Contraceptive:no    HRT: one year in [place]: Family History  Problem Relation Age of Onset  . COPD Father   . Heart disease Father     ALLERGIES:  is allergic to fosamax.  MEDICATIONS:  Current Outpatient Prescriptions  Medication Sig Dispense Refill  . ALPRAZolam (XANAX) 0.5 MG tablet Take 1 tablet (0.5 mg total) by mouth 2 (two) times daily as needed. 60 tablet 5  . Calcium-Magnesium-Zinc 167-83-8 MG TABS Take 1 tablet by mouth daily.    . cyclobenzaprine (FLEXERIL) 10 MG tablet Take 1 tablet (10 mg total) by mouth at bedtime. 30 tablet 2  . docusate sodium (COLACE) 100 MG capsule Take 1 capsule (100 mg total) by mouth daily. 20 capsule 0  . letrozole (FEMARA) 2.5 MG tablet Take 1 tablet (2.5 mg total) by mouth daily. 30 tablet 3  . levothyroxine (SYNTHROID, LEVOTHROID) 50 MCG tablet TAKE ONE TABLET BY MOUTH  DAILY 30 tablet 3  . methocarbamol (ROBAXIN) 500 MG tablet Take 1 tablet (500 mg total) by mouth every 6 (six) hours as needed for muscle spasms. 40 tablet 1  . pantoprazole (PROTONIX) 40 MG tablet Take 1 tablet (40 mg total) by mouth daily. 30 tablet 11  . simvastatin (ZOCOR) 20 MG tablet Take 1 tablet (20 mg total) by mouth at bedtime. 90 tablet 1  .  verapamil (CALAN-SR) 240 MG CR tablet Take 1 tablet (240 mg total) by mouth daily. (Patient taking differently: Take 120 mg by mouth daily. ) 30 tablet 11   No current facility-administered medications for this visit.    REVIEW OF SYSTEMS:   Constitutional: Denies fevers, chills or abnormal night sweats Eyes: Denies blurriness of vision, double vision or watery eyes Ears, nose, mouth, throat, and face: Denies mucositis or sore throat Respiratory: Denies cough, dyspnea or wheezes Cardiovascular: Denies palpitation, chest discomfort or lower extremity swelling Gastrointestinal:  Denies nausea, heartburn or change in bowel habits Skin: Denies abnormal skin rashes Lymphatics: Denies new lymphadenopathy or easy bruising Neurological:Denies numbness, tingling or new weaknesses Behavioral/Psych: Mood is stable, no new changes  All other systems were reviewed with the patient and are  negative.  PHYSICAL EXAMINATION: ECOG PERFORMANCE STATUS: 0 - Asymptomatic  Filed Vitals:   06/13/15 1456  BP: 125/68  Pulse: 103  Temp: 98.7 F (37.1 C)  Resp: 17   Filed Weights   06/13/15 1456  Weight: 131 lb 4.8 oz (59.557 kg)    GENERAL:alert, no distress and comfortable SKIN: skin color, texture, turgor are normal, no rashes or significant lesions EYES: normal, conjunctiva are pink and non-injected, sclera clear OROPHARYNX:no exudate, no erythema and lips, buccal mucosa, and tongue normal  NECK: supple, thyroid normal size, non-tender, without nodularity LYMPH:  no palpable lymphadenopathy in the cervical, axillary or inguinal LUNGS: clear to auscultation and percussion with normal breathing effort HEART: regular rate & rhythm and no murmurs and no lower extremity edema ABDOMEN:abdomen soft, non-tender and normal bowel sounds Musculoskeletal:no cyanosis of digits and no clubbing  PSYCH: alert & oriented x 3 with fluent speech NEURO: no focal motor/sensory deficits Breasts: Status post right  simple mastectomy and reconstruction. Surgical scar is healing well. Palpation of the left breasts and axilla revealed no obvious mass that I could appreciate.   LABORATORY DATA:  I have reviewed the data as listed CBC Latest Ref Rng 06/13/2015 03/17/2015 07/14/2014  WBC 3.9 - 10.3 10e3/uL 8.3 9.5 7.9  Hemoglobin 11.6 - 15.9 g/dL 12.4 12.7 12.8  Hematocrit 34.8 - 46.6 % 37.3 38.4 37.7  Platelets 145 - 400 10e3/uL 258 260 260    CMP Latest Ref Rng 06/13/2015 03/17/2015 07/14/2014  Glucose 70 - 140 mg/dl 98 83 85  BUN 7.0 - 26.0 mg/dL 15.'3 15 19  ' Creatinine 0.6 - 1.1 mg/dL 1.1 1.03(H) 1.04  Sodium 136 - 145 mEq/L 143 141 141  Potassium 3.5 - 5.1 mEq/L 4.3 4.4 4.8  Chloride 101 - 111 mmol/L - 106 103  CO2 22 - 29 mEq/L '26 26 24  ' Calcium 8.4 - 10.4 mg/dL 9.2 9.1 9.3  Total Protein 6.4 - 8.3 g/dL 6.8 - 6.6  Total Bilirubin 0.20 - 1.20 mg/dL 0.28 - 0.4  Alkaline Phos 40 - 150 U/L 71 - 75  AST 5 - 34 U/L 16 - 20  ALT 0 - 55 U/L 16 - 22     PATHOLOGY REPORT" Diagnosis 03/23/2015 Breast, simple mastectomy, right - INVASIVE ADENOCARCINOMA, SEE COMMENT. - PREVIOUS BIOPSY SITE PRESENT. - SEE TUMOR SYNOPTIC TEMPLATE BELOW. Microscopic Comment BREAST, INVASIVE TUMOR, WITHOUT LYMPH NODES PRESENT Specimen, including laterality: Right breast without lymph node sampling. Procedure: Simple mastectomy. Histologic type: Ductal. Grade: 3 of 3. Tubule formation: 3. Nuclear pleomorphism: 3. Mitotic: 2. Tumor size (gross measurement): 2.9 cm. Invasive, distance to closest margin: 0.5 cm (posterior). In-situ, distance to closest margin: N/A. If margin positive, focally or broadly: N/A. Lymphovascular invasion: Absent. Ductal carcinoma in situ: Absent. Grade: N/A. Extensive intraductal component: N/A. Lobular neoplasia: Absent. Tumor focality: Unifocal. Treatment effect: None. If present, treatment effect in breast tissue, lymph nodes or both: N/A. Extent of tumor: Skin: N/A. Nipple:  N/A. Skeletal muscle: Negative for tumor.  Estrogen receptor: Not repeated, previous study demonstrated 100% positivity ([id number]). Progesterone receptor: Not repeated, previous study demonstrated 77% proliferation rate ([id number]). Her 2 neu: Repeated, previous study demonstrated no amplification (1.90) ([id number]). Ki-67: Not repeated, previous study demonstrated 20% proliferation rate ([id number]). Non-neoplastic breast: Previous biopsy site, fibrocytic change, coarse vascular calcifications. TNM: pT2, pNX, pMX.  ONCOTYPE RS 8  RADIOGRAPHIC STUDIES: I have personally reviewed the radiological images as listed and agreed with the findings in the report.   Mr Breast Bilateral W  Wo Contrast 01/11/2015     FINDINGS: Breast composition: d. Extreme fibroglandular tissue.  Background parenchymal enhancement: Marked background enhancement is noted within the left breast, similar to prior studies from 2009. There is minimal background enhancement within the right breast.  Right breast: There is an irregular rim enhancing mass within the superior right breast at 11:30 measuring 2.5 x 2.4 x 2.4 cm. No abnormal enhancement is identified within the skin of the superior right breast in the area of concern on mammogram.  Left breast: Marked background enhancement, similar to prior studies. No dominant suspicious area of enhancement is identified.  Lymph nodes: No abnormal appearing lymph nodes.  Ancillary findings:  None.   IMPRESSION: Highly suspicious irregular enhancing mass within the superior right breast at 11:30.  RECOMMENDATION: Right breast second-look ultrasound. Ultrasound-guided biopsy is recommended. If no sonographic correlate is identified, MRI guided biopsy is recommended.  BI-RADS CATEGORY  5: Highly suggestive of malignancy.   Electronically Signed   By: Pamelia Hoit M.D.   On: 01/11/2015 13:08   US Breast Ltd Uni Right Inc Axilla 01/12/2015   IMPRESSION: A 2.3 cm diameter mass in the  upper midline right breast the suspicious.  RECOMMENDATION: Recommend ultrasound-guided core needle biopsy. This will be performed later today.  I have discussed the findings and recommendations with the patient. Results were also provided in writing at the conclusion of the visit. If applicable, a reminder letter will be sent to the patient regarding the next appointment.  BI-RADS CATEGORY  4: Suspicious abnormality - biopsy should be considered.   Electronically Signed   By: Andres Shad   On: 01/12/2015 16:13    ASSESSMENT & PLAN:  69 year old female, with remote history of right breast cancer in 1996, status post lumpectomy with lymph node dissection, adjuvant chemotherapy and irradiation. She was found to have a right breast mass by screening mammogram.  1. Recurrent right breast invasive ductal carcinoma, pT2N0, stage II, grade 3 that you with your, ER positive, PR positive, HER-2 negative -I discussed her scan and biopsy findings in great details with the patient and her family. -Giving this is a recurrent cancer in the same breast, she would need a mastectomy. She has seen Dr. Brantley Stage, and agreed to proceed with the surgery. -Giving her prior breast cancer was 20 years ago, this is likely a secondary primary. She clinically feels well, I do not think  she needs restaging scan to ruled out for metastasis. -I reviewed her surgical path and oncotype results with her. Her recurrence score is 8, which is low risk, Predicts 6% of 10 year risk of distant recurrence with tamoxifen alone. I did not recommend adjuvant chemotherapy. -Her tumor is strongly ER/PR positivity, I recommend adjuvant endocrine therapy with aromatase inhibitor to reduce the risk of cancer recurrence.  - she has been tolerating letrozole very well, we'll continue, for total of at least 5 years. -We also discussed surveillance plan after her surgery, including annual mammogram, routine physical exam and lab. - lab reviewed, within  normal limits.  2. Osteoporosis -She has known history of osteoporosis. Last bone density scan in June 2015 which showed T score -2.6. -She tried Fosamax before, had severe bone pain, and the refused to try again. I recommend her to try prolia, which works slightly different than Fosamax, but she declined. But she was seen by her PCP, and agreed to have prolia, and  Received her first dose in July 2016. -I discussed that letrozole is likely going  to make her osteopenia worse, she had a last bone scan in 2015, for follow-up every 2 years. -continue calcium 1 g, vitamin D at least 1000 units daily, and weight-bearing exercise distress or bone. - I checked a vitamin D level today,  Result still pending  Plan - continue letrozole - return to clinic in 4 months for follow-up with lab  All questions were answered. The patient knows to call the clinic with any problems, questions or concerns. I spent 20 minutes counseling the patient face to face. The total time spent in the appointment was 30 minutes and more than 50% was on counseling.     [name], MD 06/13/2015 5:22 PM

## 2015-06-13 NOTE — Telephone Encounter (Signed)
per pof to sch pt appt-gave pt copy of avs °

## 2015-06-14 LAB — VITAMIN D 25 HYDROXY (VIT D DEFICIENCY, FRACTURES): Vit D, 25-Hydroxy: 37 ng/mL (ref 30–100)

## 2015-06-15 ENCOUNTER — Other Ambulatory Visit: Payer: Self-pay | Admitting: *Deleted

## 2015-06-15 ENCOUNTER — Telehealth: Payer: Self-pay | Admitting: Hematology

## 2015-06-15 DIAGNOSIS — C50911 Malignant neoplasm of unspecified site of right female breast: Secondary | ICD-10-CM

## 2015-06-15 NOTE — Telephone Encounter (Signed)
Left message to confirm appointment for Survivorship 09/30

## 2015-06-29 ENCOUNTER — Other Ambulatory Visit: Payer: Self-pay | Admitting: Internal Medicine

## 2015-06-29 ENCOUNTER — Other Ambulatory Visit (INDEPENDENT_AMBULATORY_CARE_PROVIDER_SITE_OTHER): Payer: Medicare HMO | Admitting: Internal Medicine

## 2015-06-29 DIAGNOSIS — Z23 Encounter for immunization: Secondary | ICD-10-CM

## 2015-06-29 DIAGNOSIS — Z0000 Encounter for general adult medical examination without abnormal findings: Secondary | ICD-10-CM | POA: Diagnosis not present

## 2015-06-29 DIAGNOSIS — E785 Hyperlipidemia, unspecified: Secondary | ICD-10-CM

## 2015-06-29 LAB — LIPID PANEL
CHOLESTEROL: 174 mg/dL (ref 125–200)
HDL: 59 mg/dL (ref >=46)
LDL Cholesterol: 71 mg/dL (ref <130)
TRIGLYCERIDES: 218 mg/dL — AB (ref <150)
Total CHOL/HDL Ratio: 2.9 Ratio (ref <=5.0)
VLDL: 44 mg/dL — ABNORMAL HIGH (ref <30)

## 2015-06-29 LAB — HEPATIC FUNCTION PANEL
ALBUMIN: 4.2 g/dL (ref 3.6–5.1)
ALK PHOS: 64 U/L (ref 33–130)
ALT: 12 U/L (ref 6–29)
AST: 15 U/L (ref 10–35)
Bilirubin, Direct: 0.1 mg/dL (ref <=0.2)
Indirect Bilirubin: 0.3 mg/dL (ref 0.2–1.2)
TOTAL PROTEIN: 6.6 g/dL (ref 6.1–8.1)
Total Bilirubin: 0.4 mg/dL (ref 0.2–1.2)

## 2015-06-29 NOTE — Addendum Note (Signed)
Addended by: Roland Rack on: 06/29/2015 11:42 AM   Modules accepted: Orders

## 2015-06-30 ENCOUNTER — Encounter: Payer: Self-pay | Admitting: Internal Medicine

## 2015-06-30 ENCOUNTER — Ambulatory Visit (INDEPENDENT_AMBULATORY_CARE_PROVIDER_SITE_OTHER): Payer: Medicare HMO | Admitting: Internal Medicine

## 2015-06-30 VITALS — BP 130/80 | HR 90 | Temp 98.4°F | Wt 131.0 lb

## 2015-06-30 DIAGNOSIS — I10 Essential (primary) hypertension: Secondary | ICD-10-CM

## 2015-06-30 DIAGNOSIS — M791 Myalgia: Secondary | ICD-10-CM | POA: Diagnosis not present

## 2015-06-30 DIAGNOSIS — C50919 Malignant neoplasm of unspecified site of unspecified female breast: Secondary | ICD-10-CM | POA: Diagnosis not present

## 2015-06-30 DIAGNOSIS — E039 Hypothyroidism, unspecified: Secondary | ICD-10-CM

## 2015-06-30 DIAGNOSIS — E785 Hyperlipidemia, unspecified: Secondary | ICD-10-CM

## 2015-06-30 DIAGNOSIS — M7918 Myalgia, other site: Secondary | ICD-10-CM

## 2015-06-30 MED ORDER — CYCLOBENZAPRINE HCL 10 MG PO TABS: 10.0000 mg | ORAL_TABLET | Freq: Two times a day (BID) | ORAL | Status: DC | PRN

## 2015-06-30 MED ORDER — CYCLOBENZAPRINE HCL 10 MG PO TABS: ORAL_TABLET | ORAL | Status: DC

## 2015-06-30 NOTE — Patient Instructions (Signed)
Continue Zocor 20 mg daily. Please try the diet and exercise and lose 15 pounds before physical exam February 2017. Triglycerides are not as well controlled on Zocor as they were on Crestor.

## 2015-07-08 NOTE — Progress Notes (Signed)
   Subjective:    Patient ID: Tara Rodgers, female    DOB: 1946-06-30, 69 y.o.   MRN: [id number]  HPI Had surgery for invasive ductal carcinoma right breast in June with reconstruction. Is doing well from that standpoint. Sees oncologist. History of hypothyroidism, hypertension, anxiety, hyperlipidemia.    Review of Systems     Objective:   Physical Exam  Skin warm and dry. Nodes none. No thyromegaly. No JVD. Chest clear. Cardiac exam regular rate and rhythm. Extremities without edema.      Assessment & Plan:  Elevated triglycerides at 215. Likely has not been diet and exercising status post recent cancer surgery in June  Right invasive ductal carcinoma status post surgery and now on Femara  Essential hypertension  Hypothyroidism  Anxiety  Plan: Simvastatin will not treat of hypertriglyceridemia as well as Crestor and Lipitor. She is to work on diet and exercise and return in 3-6 months. She said that simvastatin was cheaper than Crestor. Screen for diabetes mellitus at next visit which will be physical exam in February 2017.

## 2015-07-14 ENCOUNTER — Encounter: Payer: Medicare HMO | Admitting: Nurse Practitioner

## 2015-07-14 ENCOUNTER — Telehealth: Payer: Self-pay | Admitting: Nurse Practitioner

## 2015-07-14 NOTE — Telephone Encounter (Signed)
pt cld & left a voicemail wanting to know appt-adv it was today '@1'$ :30 r/s appt and gave pt r/s time & date-pt understood

## 2015-07-25 ENCOUNTER — Ambulatory Visit (HOSPITAL_BASED_OUTPATIENT_CLINIC_OR_DEPARTMENT_OTHER): Payer: Medicare HMO | Admitting: Nurse Practitioner

## 2015-07-25 ENCOUNTER — Encounter: Payer: Self-pay | Admitting: Nurse Practitioner

## 2015-07-25 VITALS — BP 139/93 | HR 123 | Temp 98.5°F | Resp 18 | Ht <= 58 in | Wt 130.8 lb

## 2015-07-25 DIAGNOSIS — Z853 Personal history of malignant neoplasm of breast: Secondary | ICD-10-CM

## 2015-07-25 DIAGNOSIS — C50911 Malignant neoplasm of unspecified site of right female breast: Secondary | ICD-10-CM

## 2015-07-25 DIAGNOSIS — M810 Age-related osteoporosis without current pathological fracture: Secondary | ICD-10-CM | POA: Diagnosis not present

## 2015-07-25 NOTE — Progress Notes (Signed)
CLINIC:  Cancer Survivorship   REASON FOR VISIT:  Routine follow-up post-treatment for a recent history of breast cancer.  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) and adjuvant radiation ([name]).   12/13/2014 Mammogram Right breast: possible asymmetry warranting further evaluation with spot compression views and possibly ultrasound   12/16/2014 Breast US Right breast: no definitive abnormality within the superior aspect of the right breast to correspond with the mammographic finding.   01/10/2015 Breast MRI Right breast: irregular rim enhancing mass within the superior right breast at 11:30 measuring 2.5 x 2.4 x 2.4 cm. No abnormal enhancement is identified within the skin of the superior right breast in the area of concern on mammogram.   01/12/2015 Breast US Second look diagnostic mammogram and ultrasound showed a 2.3 cm mass in the upper midline right breast. No axillary adenopathy.   01/12/2015 Initial Biopsy Right breast needle core bx (upper midline): Invasive ductal carcinoma, grade 2, ER+ (100%), PR+ (77%), HER2/neu negative (ratio 1.15), Ki67 20%    01/30/2015 Procedure Breast High/Moderate Risk panel ([facility]) reveals no clinically significant variant at ATM, BRCA1, BRCA2, CDH1, CHEK2, PALB2, PTEN, STK11, and TP53.   02/08/2015 Clinical Stage Stage IIA (T2 N0)   03/23/2015 Definitive Surgery Right mastectomy ([name]): invasive adenocarcinoma, grade 3, 2.9 cm, HER2/neu repeated, negative (ratio 1.23)   03/23/2015 Oncotype  testing Score: 8 (6% ROR). No chemotherapy [name]).   03/23/2015 Pathologic Stage Stage IIA: pT2 pNx   05/04/2015 -  Anti-estrogen oral therapy Letrozole 2.24m daily. Planned duration of therapy at least 5 years.     INTERVAL HISTORY:  [facility] to review her survivorship care plan detailing her treatment course for breast cancer, as well as monitoring long-term side effects of that treatment, education regarding health maintenance, screening, and overall wellness and health promotion.     Overall, Ms. FHeatherreports feeling quite well since her surgery approximately four months ago. She has begun her letrozole (July 2016) and so far, is tolerating it well with minimal hot flashes.  She denies any change along her reconstructed right breast nor her left breast.  She has had no pain, headache, cough, or weight loss.  She denies fatigue.  She is scheduled to see Dr. FBurr Medicoin January 2017.  REVIEW OF SYSTEMS:  General: Intermittent hot flashes as above. Denies fever or chills. Cardiac: Denies palpitations, chest pain, and lower extremity edema.  Respiratory: Denies shortness of breath or dyspnea on exertion.  GI: Denies abdominal pain, constipation, diarrhea, nausea, or vomiting.  GU: Denies dysuria, hematuria, vaginal bleeding, vaginal discharge, or vaginal dryness.  Musculoskeletal: Denies joint or bone pain.  Neuro: Denies recent falls or peripheral neuropathy. Skin: Denies rash, pruritis, or open wounds.  Breast: Denies any new nodularity, masses, tenderness, nipple changes, or nipple discharge in either breast. Psych: History of anxiety which is controlled on medication. Denies depression, insomnia, or memory loss.   A 14-point review of systems was completed  and was negative, except as noted above.   ONCOLOGY TREATMENT TEAM:  1. Surgeon:  Dr. [name] at [facility]  2. Medical Oncologist: Dr. [name] 3.  Plastic surgeon: Dr. Harlow Mares    PAST MEDICAL/SURGICAL HISTORY:  Past Medical History  Diagnosis Date  . Breast cancer [facility]) 1995/2016    right sided breast cancer in 08/1994  . Hypertension   . Asthma     does not take any medications, hx of inhaler use  . Bronchitis     hx of  . Hyperthyroidism   . Anxiety   . GERD (gastroesophageal reflux disease)   . History of hiatal hernia    Past Surgical History  Procedure Laterality Date  . Carpel tunnel  Bilateral   . Incontinence surgery      with mesh insertion about 7 years ago  . Tonsillectomy      as a child  . Mastectomy Right 03/23/2015  . Reconstruction breast w/ latissimus dorsi flap Right 03/23/2015  . Latissimus flap to breast Right 03/23/2015    Procedure: LATISSIMUS FLAP TO BREAST WITH PLACEMENT OF IMPLANT FOR BREAST RECONSTRUCTION;  Surgeon: [name], MD;  Location: [place];  Service: Plastics;  Laterality: Right;  . Total mastectomy Right 03/23/2015    Procedure: RIGHT TOTAL MASTECTOMY;  Surgeon: [name], MD;  Location: [place];  Service: General;  Laterality: Right;     ALLERGIES:  Allergies  Allergen Reactions  . Fosamax [Alendronate Sodium] Other (See Comments)    Aching all over     CURRENT MEDICATIONS:  Current Outpatient Prescriptions on File Prior to Visit  Medication Sig Dispense Refill  . ALPRAZolam (XANAX) 0.5 MG tablet Take 1 tablet (0.5 mg total) by mouth 2 (two) times daily as needed. 60 tablet 5  . Calcium-Magnesium-Zinc 167-83-8 MG TABS Take 1 tablet by mouth daily.    . cyclobenzaprine (FLEXERIL) 10 MG tablet Take 1 tablet (10 mg total) by mouth 2 (two) times daily as needed. 60 tablet 5  . docusate sodium (COLACE) 100 MG capsule Take 1 capsule (100 mg total) by mouth daily. 20 capsule 0  . letrozole (FEMARA) 2.5 MG tablet Take 1 tablet (2.5 mg total) by mouth daily. 30 tablet 3  . levothyroxine (SYNTHROID, LEVOTHROID) 50 MCG tablet TAKE ONE TABLET BY MOUTH  DAILY 30 tablet 3  .  pantoprazole (PROTONIX) 40 MG tablet Take 1 tablet (40 mg total) by mouth daily. 30 tablet 11  . simvastatin (ZOCOR) 20 MG tablet Take 1 tablet (20 mg total) by mouth at bedtime. 90 tablet 1  . verapamil (CALAN-SR) 240 MG CR tablet Take 1 tablet (240 mg total) by mouth daily. (Patient taking differently: Take 120 mg by mouth daily. ) 30 tablet 11   No current facility-administered medications on file prior to visit.     ONCOLOGIC FAMILY HISTORY:  Family History  Problem Relation Age of Onset  . COPD Father   . Heart disease Father      GENETIC COUNSELING/TESTING: Performed 01/30/2015. Breast High/Moderate Risk panel ([facility]) reveals no clinically significant variant at ATM, BRCA1, BRCA2, CDH1, CHEK2, PALB2, PTEN, STK11, and TP53.  SOCIAL HISTORY:  ALESE FURNISS is married and lives with her spouse in [place], [place].  She has 2 children.  Ms. Muscato is currently not working.  She denies any current or history of tobacco or illicit drug use.  She does report social alcohol use.   PHYSICAL EXAMINATION:  Vital Signs:   Filed Vitals:  07/25/15 1420  BP: 139/93  Pulse: 123  Temp: 98.5 F (36.9 C)  Resp: 18   General: Well-nourished, well-appearing female in no acute distress.  She is unaccompanied in clinic today.   HEENT: Head is atraumatic and normocephalic.  Pupils equal and reactive to light and accomodation. Conjunctivae clear without exudate.  Sclerae anicteric. Oral mucosa is pink, moist, and intact without lesions.  Oropharynx is pink without lesions or erythema.  Lymph: No cervical, supraclavicular, infraclavicular, or axillary lymphadenopathy noted on palpation.  Cardiovascular: Regular rate and rhythm without murmurs, rubs, or gallops. Respiratory: Clear to auscultation bilaterally. Chest expansion symmetric without accessory muscle use on inspiration or expiration.  GI: Abdomen soft and round. No tenderness to palpation. Bowel sounds normoactive in 4  quadrants.  GU: Deferred.  Musculoskeletal: Muscle strength 5/5 in all extremities.   Neuro: No focal deficits. Steady gait.  Psych: Mood and affect normal and appropriate for situation.  Extremities: No edema, cyanosis, or clubbing.  Skin: Warm and dry. No open lesions noted.   LABORATORY DATA:  None for this visit.  DIAGNOSTIC IMAGING:  None for this visit.     ASSESSMENT AND PLAN:   1. History of breast cancer: Stage IIA (T2N0) invasive ductal carcinoma, ER/PR+, HER2/neu negative, s/p right mastectomy with reconstruction as above, now on adjuvant endocrine therapy followed in a program of surveillance. Ms. Grieder is doing well without clinical symptoms worrisome for disease recurrence.  She will follow-up with her medical oncologist, Dr. [name],  in January 2017 with history and physical exam per surveillance protocol.  She will continue her anti-estrogen therapy with letrozole as prescribed by  Dr. [name]. She was instructed to make Dr. [name] or myself aware if she begins to experience any increased side effects of the medication and I could see her back in clinic to help manage those side effects, as needed. A comprehensive survivorship care plan and treatment summary was reviewed with the patient today detailing her breast cancer diagnosis, treatment course, potential late/long-term effects of treatment, appropriate follow-up care with recommendations for the future, and patient education resources.  A copy of this summary, along with a letter will be sent to the patient's primary care provider via in basket message after today's visit.  Ms. Heinzel is welcome to return to the Survivorship Clinic in the future, as needed; no follow-up will be scheduled at this time.    2. Bone health:  Given Ms. Karpowicz's age/history of breast cancer and her current treatment regimen including anti-estrogen therapy with letrozole, she is at risk for bone demineralization.  Per our records, her last DEXA scan  was in September 2015 revealing osteoporosis.  She is on prolia and was encouraged to increase her consumption of foods rich in calcium and vitamin D as well as to increase her weight-bearing activities.  She was given education on specific activities to promote bone health.  3. Cancer screening:  Due to Ms. Ekstein's history and her age, she should receive screening for skin cancers, colon cancer, and gynecologic cancers. She continues to refuse her colonoscopy. The information and recommendations are listed on the patient's comprehensive care plan/treatment summary and were reviewed in detail with the patient.    4. Health maintenance and wellness promotion: Ms. Lague was encouraged to consume 5-7 servings of fruits and vegetables per day. We reviewed the "Nutrition Rainbow" handout, as well as the handout about "Nutrition for Breast Cancer Survivors."  She was also encouraged to increase her physical activity working up to  30 minutes per day most days of the week. We discussed the [employer] fitness program, which is designed for cancer survivors to help them become more physically fit after cancer treatments.  She was instructed to limit her alcohol consumption and continue to abstain from tobacco use.   6. Support services/counseling: It is not uncommon for this period of the patient's cancer care trajectory to be one of many emotions and stressors, even in patients with a prior diagnosis of breast cancer.  We discussed an opportunity for her to participate in the next session of [facility] ("Finding Your New Normal") support group series designed for patients after they have completed treatment.   Ms. Zilberman was encouraged to take advantage of our many other support services programs, support groups, and/or counseling in coping with her new life as a cancer survivor after completing anti-cancer treatment.  She was offered support today through active listening and expressive supportive counseling.   She was given information regarding our available services and encouraged to contact me with any questions or for help enrolling in any of our support group/programs.    A total of 50 minutes of face-to-face time was spent with this patient with greater than 50% of that time in counseling and care-coordination.   [name], NP  Survivorship Program [facility] [phone]   Note: PRIMARY CARE PROVIDER Elby Showers, [place] [phone]

## 2015-09-05 ENCOUNTER — Telehealth: Payer: Self-pay | Admitting: *Deleted

## 2015-09-05 DIAGNOSIS — C50911 Malignant neoplasm of unspecified site of right female breast: Secondary | ICD-10-CM

## 2015-09-05 MED ORDER — LETROZOLE 2.5 MG PO TABS: 2.5000 mg | ORAL_TABLET | Freq: Every day | ORAL | Status: DC

## 2015-09-05 NOTE — Telephone Encounter (Signed)
Patient called requesting Letrozole refill.  Next f/u is 10-17-2015 with Dr. [name].  Will send to [employer] [employer]

## 2015-09-11 ENCOUNTER — Other Ambulatory Visit: Payer: Self-pay | Admitting: Internal Medicine

## 2015-09-19 ENCOUNTER — Other Ambulatory Visit: Payer: Medicare HMO | Admitting: Internal Medicine

## 2015-09-21 ENCOUNTER — Encounter: Payer: Medicare HMO | Admitting: Internal Medicine

## 2015-10-10 ENCOUNTER — Other Ambulatory Visit: Payer: Self-pay | Admitting: Internal Medicine

## 2015-10-16 NOTE — Progress Notes (Signed)
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) and adjuvant radiation ([name]).   12/13/2014 Mammogram Right breast: possible asymmetry warranting further evaluation with spot compression views and possibly ultrasound   12/16/2014 Breast US Right breast: no definitive abnormality within the superior aspect of the right breast to correspond with the mammographic finding.   01/10/2015 Breast MRI Right breast: irregular rim enhancing mass within the superior right breast at 11:30 measuring 2.5 x 2.4 x 2.4 cm. No abnormal enhancement is identified within the skin of the superior right breast in the area of concern on mammogram.   01/12/2015 Breast US Second look diagnostic mammogram and ultrasound showed a 2.3 cm mass in the upper midline right breast. No axillary adenopathy.   01/12/2015 Initial Biopsy Right breast needle core bx (upper midline): Invasive ductal carcinoma, grade 2, ER+ (100%), PR+ (77%), HER2/neu negative (ratio 1.15), Ki67 20%    01/30/2015 Procedure Breast High/Moderate Risk  panel ([facility]) reveals no clinically significant variant at ATM, BRCA1, BRCA2, CDH1, CHEK2, PALB2, PTEN, STK11, and TP53.   02/08/2015 Clinical Stage Stage IIA (T2 N0)   03/23/2015 Definitive Surgery Right mastectomy ([name]): invasive adenocarcinoma, grade 3, 2.9 cm, HER2/neu repeated, negative (ratio 1.23)   03/23/2015 Oncotype testing Score: 8 (6% ROR). No chemotherapy [name]).   03/23/2015 Pathologic Stage Stage IIA: pT2 pNx   05/04/2015 -  Anti-estrogen oral therapy Letrozole 2.28m daily. Planned duration of therapy at least 5 years.    07/25/2015 Survivorship Survivorship visit completed and copy of care plan provided to patient.    HISTORY OF PRESENTING ILLNESS:  Tara FAUGHNAN70y.o. female is here because of recently diagnosed right breast cancer.  She had right breast cancer in 1996, which was treated with lumpectomy and axillary lymph node dissection, followed by adjuvant chemotherapy and radiation.   She did not have (/need) adjuvant endocrine therapy.  She has been followed with annual mammograms since then.  Her routine screening mammograms on 12/13/2014 showed a possible asymmetrically in the right breast. She underwent diagnostic mammogram and ultrasound on 12/16/2014 which showed a suspicious irregular subcutaneous density within the right breast, no definitive sonographic correlation was identified. She subsequently underwent breast MRI on 01/11/2015, which showed a rim enhancing mass within the right breast at 11:30 position measuring 2.5 x 2.4 x 2.4 cm. No other lesion or adenopathy. She underwent core biopsy on 01/12/2015, which showed invasive ductal carcinoma, ER positive, PR positive, HER-2 negative, Ki-67 20%.  She feels well, did not feel any palpable mass before the screening mammogram. She denies any pain, dyspnea, fatigue or any other symptoms.  CURRENT THERAPY:   Letrozole 2.5 mg once daily, started on 05/04/2015.  INTERIM HISTORY Tara Rodgers returns for follow up. She is  tolerating letrozole well overall. She has chronic arthritis of right knee, she recently had injection by her orthopedic surgeon, which help a lot. She has intermittent muscle spasm on right side low chest. She also has moderate hot flush, but manageable. No other complains.    MEDICAL HISTORY:  Past Medical History  Diagnosis Date  . Breast cancer [facility]) 1995/2016    right sided breast cancer in 08/1994  . Hypertension   . Asthma     does not take any medications, hx of inhaler use  . Bronchitis     hx of  . Hyperthyroidism   . Anxiety   . GERD (gastroesophageal reflux disease)   . History of hiatal hernia     SURGICAL HISTORY: Past Surgical History  Procedure Laterality Date  . Carpel tunnel  Bilateral   . Incontinence surgery      with mesh insertion about 7 years ago  . Tonsillectomy      as a child  . Mastectomy Right 03/23/2015  . Reconstruction breast w/ latissimus dorsi flap Right 03/23/2015  . Latissimus flap to breast Right 03/23/2015    Procedure: LATISSIMUS FLAP TO BREAST WITH PLACEMENT OF IMPLANT FOR BREAST RECONSTRUCTION;  Surgeon: Etter Sjogren, MD;  Location: [facility] OR;  Service: Plastics;  Laterality: Right;  . Total mastectomy Right 03/23/2015    Procedure: RIGHT TOTAL MASTECTOMY;  Surgeon: Glenna Fellows, MD;  Location: [facility] OR;  Service: General;  Laterality: Right;    SOCIAL HISTORY: Social History   Social History  . Marital Status: Married    Spouse Name: N/A  . Number of Children: 2  . Years of Education: N/A   Occupational History  . Not on file.   Social History Main Topics  . Smoking status: Never Smoker   . Smokeless tobacco: Never Used  . Alcohol Use: Yes     Comment: occasional  . Drug Use: No  . Sexual Activity: Not on file   Other Topics Concern  . Not on file   Social History Narrative   GYN HISTORY  Menarchal: 70 yo  LMP: 1990 Contraceptive:no    HRT: one year in 1990  G2P2  FAMILY HISTORY: Family History  Problem Relation  Age of Onset  . COPD Father   . Heart disease Father     ALLERGIES:  is allergic to fosamax.  MEDICATIONS:  Current Outpatient Prescriptions  Medication Sig Dispense Refill  . ALPRAZolam (XANAX) 0.5 MG tablet Take 1 tablet (0.5 mg total) by mouth 2 (two) times daily as needed. 60 tablet 5  . Calcium-Magnesium-Zinc 167-83-8 MG TABS Take 1 tablet by mouth daily.    . cyclobenzaprine (FLEXERIL) 10 MG tablet Take 1 tablet (10 mg total) by mouth 2 (two) times daily as needed. 60 tablet 5  . docusate sodium (COLACE) 100 MG capsule Take 1 capsule (100 mg total) by mouth daily. 20 capsule 0  . letrozole (FEMARA) 2.5 MG tablet Take 1 tablet (2.5 mg total) by mouth daily. 30 tablet 1  . levothyroxine (SYNTHROID, LEVOTHROID) 50 MCG tablet TAKE ONE TABLET BY MOUTH  DAILY 30 tablet 2  . pantoprazole (PROTONIX) 40 MG tablet Take 1 tablet (40 mg total) by mouth daily. 30 tablet 11  . simvastatin (ZOCOR) 20 MG tablet TAKE ONE TABLET BY MOUTH AT BEDTIME 90 tablet 3  . verapamil (CALAN-SR) 240 MG CR tablet  Take 1 tablet (240 mg total) by mouth daily. (Patient taking differently: Take 120 mg by mouth daily. ) 30 tablet 11   No current facility-administered medications for this visit.    REVIEW OF SYSTEMS:   Constitutional: Denies fevers, chills or abnormal night sweats Eyes: Denies blurriness of vision, double vision or watery eyes Ears, nose, mouth, throat, and face: Denies mucositis or sore throat Respiratory: Denies cough, dyspnea or wheezes Cardiovascular: Denies palpitation, chest discomfort or lower extremity swelling Gastrointestinal:  Denies nausea, heartburn or change in bowel habits Skin: Denies abnormal skin rashes Lymphatics: Denies new lymphadenopathy or easy bruising Neurological:Denies numbness, tingling or new weaknesses Behavioral/Psych: Mood is stable, no new changes  All other systems were reviewed with the patient and are negative.  PHYSICAL EXAMINATION: ECOG PERFORMANCE STATUS:  0 - Asymptomatic  Filed Vitals:   10/17/15 1315  BP: 137/93  Pulse: 95  Temp: 98.2 F (36.8 C)  Resp: 18   Filed Weights   10/17/15 1315  Weight: 134 lb 12.8 oz (61.145 kg)    GENERAL:alert, no distress and comfortable SKIN: skin color, texture, turgor are normal, no rashes or significant lesions EYES: normal, conjunctiva are pink and non-injected, sclera clear OROPHARYNX:no exudate, no erythema and lips, buccal mucosa, and tongue normal  NECK: supple, thyroid normal size, non-tender, without nodularity LYMPH:  no palpable lymphadenopathy in the cervical, axillary or inguinal LUNGS: clear to auscultation and percussion with normal breathing effort HEART: regular rate & rhythm and no murmurs and no lower extremity edema ABDOMEN:abdomen soft, non-tender and normal bowel sounds Musculoskeletal:no cyanosis of digits and no clubbing  PSYCH: alert & oriented x 3 with fluent speech NEURO: no focal motor/sensory deficits Breasts: Status post right simple mastectomy and reconstruction. Surgical scar is healing well. Palpation of the left breasts and axilla revealed no obvious mass that I could appreciate.   LABORATORY DATA:  I have reviewed the data as listed CBC Latest Ref Rng 10/17/2015 06/13/2015 03/17/2015  WBC 3.9 - 10.3 10e3/uL 10.3 8.3 9.5  Hemoglobin 11.6 - 15.9 g/dL 12.4 12.4 12.7  Hematocrit 34.8 - 46.6 % 38.1 37.3 38.4  Platelets 145 - 400 10e3/uL 309 258 260    CMP Latest Ref Rng 10/17/2015 06/29/2015 06/13/2015  Glucose 70 - 140 mg/dl 77 - 98  BUN 7.0 - 26.0 mg/dL 26.3(H) - 15.3  Creatinine 0.6 - 1.1 mg/dL 1.2(H) - 1.1  Sodium 136 - 145 mEq/L 139 - 143  Potassium 3.5 - 5.1 mEq/L 4.7 - 4.3  Chloride 101 - 111 mmol/L - - -  CO2 22 - 29 mEq/L 23 - 26  Calcium 8.4 - 10.4 mg/dL 9.0 - 9.2  Total Protein 6.4 - 8.3 g/dL 7.4 6.6 6.8  Total Bilirubin 0.20 - 1.20 mg/dL 0.47 0.4 0.28  Alkaline Phos 40 - 150 U/L 61 64 71  AST 5 - 34 U/L _0 ALT 0 - 55 U/L _1 PATHOLOGY REPORT" Diagnosis 03/23/2015 Breast, simple mastectomy, right - INVASIVE ADENOCARCINOMA, SEE COMMENT. - PREVIOUS BIOPSY SITE PRESENT. - SEE TUMOR SYNOPTIC TEMPLATE BELOW. Microscopic Comment BREAST, INVASIVE TUMOR, WITHOUT LYMPH NODES PRESENT Specimen, including laterality: Right breast without lymph node sampling. Procedure: Simple mastectomy. Histologic type: Ductal. Grade: 3 of 3. Tubule formation: 3. Nuclear pleomorphism: 3. Mitotic: 2. Tumor size (gross measurement): 2.9 cm. Invasive, distance to closest margin: 0.5 cm (posterior). In-situ, distance to closest margin: N/A. If margin positive, focally or broadly: N/A. Lymphovascular invasion: Absent. Ductal carcinoma  in situ: Absent. Grade: N/A. Extensive intraductal component: N/A. Lobular neoplasia: Absent. Tumor focality: Unifocal. Treatment effect: None. If present, treatment effect in breast tissue, lymph nodes or both: N/A. Extent of tumor: Skin: N/A. Nipple: N/A. Skeletal muscle: Negative for tumor.  Estrogen receptor: Not repeated, previous study demonstrated 100% positivity ([id number]). Progesterone receptor: Not repeated, previous study demonstrated 77% proliferation rate ([id number]). Her 2 neu: Repeated, previous study demonstrated no amplification (1.90) ([id number]). Ki-67: Not repeated, previous study demonstrated 20% proliferation rate ([id number]). Non-neoplastic breast: Previous biopsy site, fibrocytic change, coarse vascular calcifications. TNM: pT2, pNX, pMX.  ONCOTYPE RS 8  RADIOGRAPHIC STUDIES: I have personally reviewed the radiological images as listed and agreed with the findings in the report.   Mr Breast Bilateral W Wo Contrast 01/11/2015     FINDINGS: Breast composition: d. Extreme fibroglandular tissue.  Background parenchymal enhancement: Marked background enhancement is noted within the left breast, similar to prior studies from 2009. There is minimal background  enhancement within the right breast.  Right breast: There is an irregular rim enhancing mass within the superior right breast at 11:30 measuring 2.5 x 2.4 x 2.4 cm. No abnormal enhancement is identified within the skin of the superior right breast in the area of concern on mammogram.  Left breast: Marked background enhancement, similar to prior studies. No dominant suspicious area of enhancement is identified.  Lymph nodes: No abnormal appearing lymph nodes.  Ancillary findings:  None.   IMPRESSION: Highly suspicious irregular enhancing mass within the superior right breast at 11:30.  RECOMMENDATION: Right breast second-look ultrasound. Ultrasound-guided biopsy is recommended. If no sonographic correlate is identified, MRI guided biopsy is recommended.  BI-RADS CATEGORY  5: Highly suggestive of malignancy.   Electronically Signed   By: Pamelia Hoit M.D.   On: 01/11/2015 13:08   US Breast Ltd Uni Right Inc Axilla 01/12/2015   IMPRESSION: A 2.3 cm diameter mass in the upper midline right breast the suspicious.  RECOMMENDATION: Recommend ultrasound-guided core needle biopsy. This will be performed later today.  I have discussed the findings and recommendations with the patient. Results were also provided in writing at the conclusion of the visit. If applicable, a reminder letter will be sent to the patient regarding the next appointment.  BI-RADS CATEGORY  4: Suspicious abnormality - biopsy should be considered.   Electronically Signed   By: Andres Shad   On: 01/12/2015 16:13    ASSESSMENT & PLAN:  70 year old female, with remote history of right breast cancer in 1996, status post lumpectomy with lymph node dissection, adjuvant chemotherapy and irradiation. She was found to have a right breast mass by screening mammogram.  1. Recurrent right breast invasive ductal carcinoma, pT2N0, stage II, grade 3, ER positive, PR positive, HER-2 negative -she  Is clinically doing well, physical exam and last mammogram was  negative for recurrence. - she has been tolerating letrozole very well, we'll continue, for total of at least 5 years. - she'll continue surveillance with annual mammogram, routine physical exam and lab. - lab reviewed, within normal limits.  2. Osteoporosis -She has known history of osteoporosis.  Last bone density scan on 05/04/2015 showed T score -2.6 - she is getting Prolia at her PCP's office  Every 6 months. -continue calcium 1 g, vitamin D at least 1000 units daily, and weight-bearing exercise distress or bone. - vitamin D level was normal on 06/13/2015.  3.  Arthritis - she will follow-up with her orthopedic surgeon for the right knee arthritis - we  discussed letrozole can cause muscular and joint discomfort.  If her arthritis gets much worse, we can consider switch her to another aromatase inhibitor.  Plan - continue letrozole,  I refilled for her today - return to clinic in 4 months for follow-up with lab - annual  Diagnostic left breast  Mammogram at the end of March 2017  All questions were answered. The patient knows to call the clinic with any problems, questions or concerns. I spent 20 minutes counseling the patient face to face. The total time spent in the appointment was 30 minutes and more than 50% was on counseling.     [name], MD 10/17/2015 1:28 PM

## 2015-10-17 ENCOUNTER — Ambulatory Visit (HOSPITAL_BASED_OUTPATIENT_CLINIC_OR_DEPARTMENT_OTHER): Payer: PPO | Admitting: Hematology

## 2015-10-17 ENCOUNTER — Encounter: Payer: Self-pay | Admitting: Hematology

## 2015-10-17 ENCOUNTER — Other Ambulatory Visit: Payer: Self-pay | Admitting: Hematology

## 2015-10-17 ENCOUNTER — Telehealth: Payer: Self-pay | Admitting: Hematology

## 2015-10-17 ENCOUNTER — Other Ambulatory Visit (HOSPITAL_BASED_OUTPATIENT_CLINIC_OR_DEPARTMENT_OTHER): Payer: PPO

## 2015-10-17 VITALS — BP 137/93 | HR 95 | Temp 98.2°F | Resp 18 | Ht <= 58 in | Wt 134.8 lb

## 2015-10-17 DIAGNOSIS — Z170 Estrogen receptor positive status [ER+]: Secondary | ICD-10-CM

## 2015-10-17 DIAGNOSIS — C50911 Malignant neoplasm of unspecified site of right female breast: Secondary | ICD-10-CM

## 2015-10-17 DIAGNOSIS — M810 Age-related osteoporosis without current pathological fracture: Secondary | ICD-10-CM | POA: Diagnosis not present

## 2015-10-17 DIAGNOSIS — Z9011 Acquired absence of right breast and nipple: Secondary | ICD-10-CM

## 2015-10-17 DIAGNOSIS — M1990 Unspecified osteoarthritis, unspecified site: Secondary | ICD-10-CM

## 2015-10-17 DIAGNOSIS — Z1231 Encounter for screening mammogram for malignant neoplasm of breast: Secondary | ICD-10-CM

## 2015-10-17 DIAGNOSIS — Z853 Personal history of malignant neoplasm of breast: Secondary | ICD-10-CM

## 2015-10-17 LAB — CBC WITH DIFFERENTIAL/PLATELET
BASO%: 1.2 % (ref 0.0–2.0)
Basophils Absolute: 0.1 10*3/uL (ref 0.0–0.1)
EOS%: 1.4 % (ref 0.0–7.0)
Eosinophils Absolute: 0.1 10*3/uL (ref 0.0–0.5)
HCT: 38.1 % (ref 34.8–46.6)
HEMOGLOBIN: 12.4 g/dL (ref 11.6–15.9)
LYMPH%: 21.5 % (ref 14.0–49.7)
MCH: 28.9 pg (ref 25.1–34.0)
MCHC: 32.7 g/dL (ref 31.5–36.0)
MCV: 88.4 fL (ref 79.5–101.0)
MONO#: 0.9 10*3/uL (ref 0.1–0.9)
MONO%: 8.9 % (ref 0.0–14.0)
NEUT%: 67 % (ref 38.4–76.8)
NEUTROS ABS: 6.9 10*3/uL — AB (ref 1.5–6.5)
Platelets: 309 10*3/uL (ref 145–400)
RBC: 4.3 10*6/uL (ref 3.70–5.45)
RDW: 13.9 % (ref 11.2–14.5)
WBC: 10.3 10*3/uL (ref 3.9–10.3)
lymph#: 2.2 10*3/uL (ref 0.9–3.3)

## 2015-10-17 LAB — COMPREHENSIVE METABOLIC PANEL
ALT: 15 U/L (ref 0–55)
AST: 13 U/L (ref 5–34)
Albumin: 4 g/dL (ref 3.5–5.0)
Alkaline Phosphatase: 61 U/L (ref 40–150)
Anion Gap: 7 mEq/L (ref 3–11)
BILIRUBIN TOTAL: 0.47 mg/dL (ref 0.20–1.20)
BUN: 26.3 mg/dL — ABNORMAL HIGH (ref 7.0–26.0)
CHLORIDE: 108 meq/L (ref 98–109)
CO2: 23 meq/L (ref 22–29)
Calcium: 9 mg/dL (ref 8.4–10.4)
Creatinine: 1.2 mg/dL — ABNORMAL HIGH (ref 0.6–1.1)
EGFR: 48 mL/min/{1.73_m2} — AB (ref >90)
GLUCOSE: 77 mg/dL (ref 70–140)
Potassium: 4.7 mEq/L (ref 3.5–5.1)
SODIUM: 139 meq/L (ref 136–145)
TOTAL PROTEIN: 7.4 g/dL (ref 6.4–8.3)

## 2015-10-17 MED ORDER — LETROZOLE 2.5 MG PO TABS: 2.5000 mg | ORAL_TABLET | Freq: Every day | ORAL | Status: DC

## 2015-10-17 NOTE — Telephone Encounter (Signed)
Talked to patient here in office. Scheduled appt.       [facility]. °

## 2015-10-18 DIAGNOSIS — C50911 Malignant neoplasm of unspecified site of right female breast: Secondary | ICD-10-CM | POA: Diagnosis not present

## 2015-10-27 DIAGNOSIS — C50911 Malignant neoplasm of unspecified site of right female breast: Secondary | ICD-10-CM | POA: Diagnosis not present

## 2015-10-30 DIAGNOSIS — M2241 Chondromalacia patellae, right knee: Secondary | ICD-10-CM | POA: Diagnosis not present

## 2015-10-30 DIAGNOSIS — M1711 Unilateral primary osteoarthritis, right knee: Secondary | ICD-10-CM | POA: Diagnosis not present

## 2015-10-30 DIAGNOSIS — M2391 Unspecified internal derangement of right knee: Secondary | ICD-10-CM | POA: Diagnosis not present

## 2015-10-30 DIAGNOSIS — H04123 Dry eye syndrome of bilateral lacrimal glands: Secondary | ICD-10-CM | POA: Diagnosis not present

## 2015-10-30 DIAGNOSIS — M25561 Pain in right knee: Secondary | ICD-10-CM | POA: Diagnosis not present

## 2015-11-15 DIAGNOSIS — M810 Age-related osteoporosis without current pathological fracture: Secondary | ICD-10-CM | POA: Diagnosis not present

## 2015-11-15 DIAGNOSIS — E559 Vitamin D deficiency, unspecified: Secondary | ICD-10-CM | POA: Diagnosis not present

## 2015-11-15 DIAGNOSIS — R5383 Other fatigue: Secondary | ICD-10-CM | POA: Diagnosis not present

## 2015-11-24 DIAGNOSIS — E559 Vitamin D deficiency, unspecified: Secondary | ICD-10-CM | POA: Diagnosis not present

## 2015-11-24 DIAGNOSIS — M810 Age-related osteoporosis without current pathological fracture: Secondary | ICD-10-CM | POA: Diagnosis not present

## 2015-11-28 ENCOUNTER — Other Ambulatory Visit: Payer: PPO | Admitting: Internal Medicine

## 2015-11-28 DIAGNOSIS — M810 Age-related osteoporosis without current pathological fracture: Secondary | ICD-10-CM

## 2015-11-28 DIAGNOSIS — E785 Hyperlipidemia, unspecified: Secondary | ICD-10-CM | POA: Diagnosis not present

## 2015-11-28 DIAGNOSIS — Z130 Encounter for screening for diseases of the blood and blood-forming organs and certain disorders involving the immune mechanism: Secondary | ICD-10-CM

## 2015-11-28 DIAGNOSIS — Z0000 Encounter for general adult medical examination without abnormal findings: Secondary | ICD-10-CM

## 2015-11-28 DIAGNOSIS — E039 Hypothyroidism, unspecified: Secondary | ICD-10-CM | POA: Diagnosis not present

## 2015-11-28 DIAGNOSIS — I10 Essential (primary) hypertension: Secondary | ICD-10-CM | POA: Diagnosis not present

## 2015-11-28 LAB — COMPLETE METABOLIC PANEL WITH GFR
ALBUMIN: 3.9 g/dL (ref 3.6–5.1)
ALK PHOS: 55 U/L (ref 33–130)
ALT: 19 U/L (ref 6–29)
AST: 14 U/L (ref 10–35)
BILIRUBIN TOTAL: 0.4 mg/dL (ref 0.2–1.2)
BUN: 22 mg/dL (ref 7–25)
CALCIUM: 9.1 mg/dL (ref 8.6–10.4)
CO2: 27 mmol/L (ref 20–31)
CREATININE: 1.16 mg/dL — AB (ref 0.50–0.99)
Chloride: 105 mmol/L (ref 98–110)
GFR, EST AFRICAN AMERICAN: 56 mL/min — AB (ref >=60)
GFR, EST NON AFRICAN AMERICAN: 48 mL/min — AB (ref >=60)
Glucose, Bld: 81 mg/dL (ref 65–99)
Potassium: 4.8 mmol/L (ref 3.5–5.3)
Sodium: 140 mmol/L (ref 135–146)
TOTAL PROTEIN: 6.6 g/dL (ref 6.1–8.1)

## 2015-11-28 LAB — CBC WITH DIFFERENTIAL/PLATELET
BASOS ABS: 0 10*3/uL (ref 0.0–0.1)
Basophils Relative: 0 % (ref 0–1)
Eosinophils Absolute: 0.1 10*3/uL (ref 0.0–0.7)
Eosinophils Relative: 1 % (ref 0–5)
HEMATOCRIT: 37.6 % (ref 36.0–46.0)
HEMOGLOBIN: 12.4 g/dL (ref 12.0–15.0)
LYMPHS ABS: 1.9 10*3/uL (ref 0.7–4.0)
LYMPHS PCT: 27 % (ref 12–46)
MCH: 29.8 pg (ref 26.0–34.0)
MCHC: 33 g/dL (ref 30.0–36.0)
MCV: 90.4 fL (ref 78.0–100.0)
MONOS PCT: 8 % (ref 3–12)
MPV: 9.1 fL (ref 8.6–12.4)
Monocytes Absolute: 0.6 10*3/uL (ref 0.1–1.0)
NEUTROS PCT: 64 % (ref 43–77)
Neutro Abs: 4.6 10*3/uL (ref 1.7–7.7)
Platelets: 288 10*3/uL (ref 150–400)
RBC: 4.16 MIL/uL (ref 3.87–5.11)
RDW: 14.7 % (ref 11.5–15.5)
WBC: 7.2 10*3/uL (ref 4.0–10.5)

## 2015-11-28 LAB — LIPID PANEL
CHOLESTEROL: 195 mg/dL (ref 125–200)
HDL: 73 mg/dL (ref >=46)
LDL Cholesterol: 102 mg/dL (ref <130)
TRIGLYCERIDES: 100 mg/dL (ref <150)
Total CHOL/HDL Ratio: 2.7 Ratio (ref <=5.0)
VLDL: 20 mg/dL (ref <30)

## 2015-11-28 LAB — TSH: TSH: 17.79 mIU/L — ABNORMAL HIGH

## 2015-11-29 LAB — VITAMIN D 25 HYDROXY (VIT D DEFICIENCY, FRACTURES): Vit D, 25-Hydroxy: 38 ng/mL (ref 30–100)

## 2015-11-30 ENCOUNTER — Ambulatory Visit (INDEPENDENT_AMBULATORY_CARE_PROVIDER_SITE_OTHER): Payer: PPO | Admitting: Internal Medicine

## 2015-11-30 ENCOUNTER — Other Ambulatory Visit (HOSPITAL_COMMUNITY)
Admission: RE | Admit: 2015-11-30 | Discharge: 2015-11-30 | Disposition: A | Discharge status: Home / Self Care | Payer: PPO | Source: Ambulatory Visit | Attending: Internal Medicine | Admitting: Internal Medicine

## 2015-11-30 ENCOUNTER — Encounter: Payer: Self-pay | Admitting: Internal Medicine

## 2015-11-30 VITALS — BP 132/80 | HR 120 | Temp 98.7°F | Resp 18 | Ht <= 58 in | Wt 134.0 lb

## 2015-11-30 DIAGNOSIS — Z0000 Encounter for general adult medical examination without abnormal findings: Secondary | ICD-10-CM | POA: Diagnosis not present

## 2015-11-30 DIAGNOSIS — F411 Generalized anxiety disorder: Secondary | ICD-10-CM | POA: Diagnosis not present

## 2015-11-30 DIAGNOSIS — R748 Abnormal levels of other serum enzymes: Secondary | ICD-10-CM

## 2015-11-30 DIAGNOSIS — R7989 Other specified abnormal findings of blood chemistry: Secondary | ICD-10-CM

## 2015-11-30 DIAGNOSIS — Z124 Encounter for screening for malignant neoplasm of cervix: Secondary | ICD-10-CM | POA: Diagnosis not present

## 2015-11-30 DIAGNOSIS — C50911 Malignant neoplasm of unspecified site of right female breast: Secondary | ICD-10-CM | POA: Diagnosis not present

## 2015-11-30 DIAGNOSIS — I10 Essential (primary) hypertension: Secondary | ICD-10-CM | POA: Diagnosis not present

## 2015-11-30 DIAGNOSIS — M810 Age-related osteoporosis without current pathological fracture: Secondary | ICD-10-CM | POA: Diagnosis not present

## 2015-11-30 DIAGNOSIS — R946 Abnormal results of thyroid function studies: Secondary | ICD-10-CM

## 2015-11-30 DIAGNOSIS — E785 Hyperlipidemia, unspecified: Secondary | ICD-10-CM | POA: Diagnosis not present

## 2015-11-30 DIAGNOSIS — K219 Gastro-esophageal reflux disease without esophagitis: Secondary | ICD-10-CM

## 2015-11-30 DIAGNOSIS — E039 Hypothyroidism, unspecified: Secondary | ICD-10-CM

## 2015-11-30 MED ORDER — LEVOTHYROXINE SODIUM 75 MCG PO TABS: 75.0000 ug | ORAL_TABLET | Freq: Every day | ORAL | Status: DC

## 2015-12-05 LAB — CYTOLOGY - PAP

## 2015-12-15 ENCOUNTER — Ambulatory Visit
Admission: RE | Admit: 2015-12-15 | Discharge: 2015-12-15 | Disposition: A | Discharge status: Home / Self Care | Payer: PPO | Source: Ambulatory Visit | Attending: Hematology | Admitting: Hematology

## 2015-12-15 ENCOUNTER — Other Ambulatory Visit: Payer: Self-pay | Admitting: Internal Medicine

## 2015-12-15 DIAGNOSIS — Z1231 Encounter for screening mammogram for malignant neoplasm of breast: Secondary | ICD-10-CM

## 2015-12-15 DIAGNOSIS — Z853 Personal history of malignant neoplasm of breast: Secondary | ICD-10-CM

## 2015-12-15 DIAGNOSIS — Z9011 Acquired absence of right breast and nipple: Secondary | ICD-10-CM

## 2016-01-07 NOTE — Progress Notes (Signed)
Subjective:    Patient ID: Tara Rodgers, female    DOB: 20-Jan-1946, 70 y.o.   MRN: [id number]  HPI 70 year old White Female in today for health maintenance exam and evaluation of medical issues. She has a history of anxiety, allergic rhinitis, hypertension, breast cancer, hyperlipidemia, GE reflux, osteoporosis, hypothyroidism.  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  Past medical history: Right carpal tunnel syndrome 1995, surgery for left carpal tunnel syndrome in 2010, bilateral tubal ligation 1986. Left tennis elbow release 1990. Foot and ankle surgery 1999. Right breast lumpectomy with axillary node dissection 1996. She took estrogen replacement in the early 1990s. Suprapubic sling March 2008. History of perennial allergic rhinitis and vasomotor rhinitis. Used to be followed by Dr. Donneta Romberg for allergies in 2011. In 2001 skin test were positive for black walnut, dog, cat, house dust, dust mites, cockroach and mold as well as weeds.  Remote history of PVCs in 1991 treated with verapamil which she also takes for hypertension.  She is never had colonoscopy. She had sigmoidoscopy done in 1998 by Dr. Teena Irani. Pneumovax immunization December 2010.  Social history: She does not smoke or consume alcohol. 2 adult children, son and a daughter. Has been married twice. Present husband is a Dealer and has heart issues. This is been stressful. He also drinks too much alcohol. She is retired from [employer].  Family history: Father died in 6 of competitions of COPD at age 20 with history of coronary artery disease. One half sister with thyroid issues.  In 1996, she had a right lumpectomy with axillary node dissection for right breast carcinoma. She had taken estrogen replacement in the early 1990s. In June 2016 she underwent right mastectomy with reconstruction surgery for history of invasive ductal carcinoma right breast. She's being  followed by oncologist. Has been doing well.   Review of Systems situational stress with husband, fatigue,  and history of anxiety     Objective:   Physical Exam  Constitutional: She is oriented to person, place, and time. She appears well-developed and well-nourished.  HENT:  Head: Normocephalic and atraumatic.  Left Ear: External ear normal.  Mouth/Throat: No oropharyngeal exudate.  Eyes: Conjunctivae and EOM are normal. Pupils are equal, round, and reactive to light. Right eye exhibits no discharge. Left eye exhibits no discharge. No scleral icterus.  Neck: Neck supple. No JVD present. No thyromegaly present.  Cardiovascular: Normal rate, regular rhythm, normal heart sounds and intact distal pulses.   No murmur heard. Pulmonary/Chest: Effort normal and breath sounds normal. She has no wheezes.  Breast without masses  Abdominal: Soft. Bowel sounds are normal. She exhibits no distension and no mass. There is no tenderness. There is no rebound and no guarding.  Genitourinary:  Bimanual normal. Pap taken with history of breast cancer.  Lymphadenopathy:    She has no cervical adenopathy.  Neurological: She is alert and oriented to person, place, and time. She has normal reflexes. She displays normal reflexes. No cranial nerve deficit. Coordination normal.  Skin: Skin is warm and dry. No rash noted.  Psychiatric: She has a normal mood and affect. Her behavior is normal. Judgment and thought content normal.  Vitals reviewed.         Assessment & Plan:  Essential hypertension-stable  History of breast cancer-Followed by oncologist status post reconstruction surgery and right mastectomy  Hypothyroidism-TSH is elevated. Return in March for recheck. I believe she's not been taking medication consistently.  Hyperlipidemia-stable on treatment  Situational stress-husband has not been well recently and patient had breast cancer  Anxiety disorder-treated with anti-anxiety  medication  Allergic rhinitis   GE reflux  Osteoporosis treated with vitamin D and calcium  Mild elevation of serum creatinine-continue to monitor  Plan: Continue same medications and return in 6 months. 3 Hemoccult cards given. Declines colonoscopy.  Subjective:   Patient presents for Medicare Annual/Subsequent preventive examination.  Review Past Medical/Family/Social: See above  Risk Factors  Current exercise habits: Sedentary Dietary issues discussed: Low fat low carbohydrate  Cardiac risk factors:Hypertension, hyperlipidemia and family history in father  Depression Screen  (Note: if answer to either of the following is "Yes", a more complete depression screening is indicated)   Over the past two weeks, have you felt down, depressed or hopeless? No  Over the past two weeks, have you felt little interest or pleasure in doing things? No Have you lost interest or pleasure in daily life? No Do you often feel hopeless? No Do you cry easily over simple problems? No   Activities of Daily Living  In your present state of health, do you have any difficulty performing the following activities?:   Driving? No  Managing money? No  Feeding yourself? No  Getting from bed to chair? No  Climbing a flight of stairs? No  Preparing food and eating?: No  Bathing or showering? No  Getting dressed: No  Getting to the toilet? No  Using the toilet:No  Moving around from place to place: No  In the past year have you fallen or had a near fall?:No  Are you sexually active? Patient did not answer this question Do you have more than one partner? No   Hearing Difficulties: No  Do you often ask people to speak up or repeat themselves? No  Do you experience ringing or noises in your ears? Yes Do you have difficulty understanding soft or whispered voices? Sometimes Do you feel that you have a problem with memory? No Do you often misplace items? No    Home Safety:  Do you have a smoke  alarm at your residence? Yes Do you have grab bars in the bathroom?No Do you have throw rugs in your house? No   Cognitive Testing  Alert? Yes Normal Appearance?Yes  Oriented to person? Yes Place? Yes  Time? Yes  Recall of three objects? Yes  Can perform simple calculations? Yes  Displays appropriate judgment?Yes  Can read the correct time from a watch face?Yes   List the Names of Other Physician/Practitioners you currently use:  See referral list for the physicians patient is currently seeing.  Oncologist   Review of Systems: See above   Objective:     General appearance: Appears stated age  Head: Normocephalic, without obvious abnormality, atraumatic  Eyes: conj clear, EOMi PEERLA  Ears: normal TM's and external ear canals both ears  Nose: Nares normal. Septum midline. Mucosa normal. No drainage or sinus tenderness.  Throat: lips, mucosa, and tongue normal; teeth and gums normal  Neck: no adenopathy, no carotid bruit, no JVD, supple, symmetrical, trachea midline and thyroid not enlarged, symmetric, no tenderness/mass/nodules  No CVA tenderness.  Lungs: clear to auscultation bilaterally   Heart: regular rate and rhythm, S1, S2 normal, no murmur, click, rub or gallop  Abdomen: soft, non-tender; bowel sounds normal; no masses, no organomegaly  Musculoskeletal: ROM normal in all joints, no crepitus, no deformity, Normal muscle strengthen. Back  is symmetric, no curvature. Skin: Skin color, texture,  turgor normal. No rashes or lesions  Lymph nodes: Cervical, supraclavicular, and axillary nodes normal.  Neurologic: CN 2 -12 Normal, Normal symmetric reflexes. Normal coordination and gait  Psych: Alert & Oriented x 3, Mood appear stable.    Assessment:    Annual wellness medicare exam   Plan:    During the course of the visit the patient was educated and counseled about appropriate screening and preventive services including:   Annual mammogram  Spoke with patient once  again about colonoscopy. She has declined previously. Reluctantly agreed to do Hemoccult cards     Patient Instructions (the written plan) was given to the patient.  Medicare Attestation  I have personally reviewed:  The patient's medical and social history  Their use of alcohol, tobacco or illicit drugs  Their current medications and supplements  The patient's functional ability including ADLs,fall risks, home safety risks, cognitive, and hearing and visual impairment  Diet and physical activities  Evidence for depression or mood disorders  The patient's weight, height, BMI, and visual acuity have been recorded in the chart. I have made referrals, counseling, and provided education to the patient based on review of the above and I have provided the patient with a written personalized care plan for preventive services.

## 2016-01-07 NOTE — Patient Instructions (Signed)
Make sure you taking thyroid replacement medication on regular basis and return in March for repeat TSH. Continue same medications and return otherwise in 6 months. Try to make time for exercise and watch diet.

## 2016-01-08 ENCOUNTER — Other Ambulatory Visit: Payer: PPO | Admitting: Internal Medicine

## 2016-01-08 DIAGNOSIS — E039 Hypothyroidism, unspecified: Secondary | ICD-10-CM | POA: Diagnosis not present

## 2016-01-08 LAB — TSH: TSH: 0.85 m[IU]/L

## 2016-01-12 ENCOUNTER — Ambulatory Visit: Payer: PPO | Admitting: Internal Medicine

## 2016-01-15 ENCOUNTER — Telehealth: Payer: Self-pay | Admitting: Internal Medicine

## 2016-01-15 MED ORDER — ALPRAZOLAM 0.5 MG PO TABS: 0.5000 mg | ORAL_TABLET | Freq: Two times a day (BID) | ORAL | Status: DC | PRN

## 2016-01-15 MED ORDER — LEVOTHYROXINE SODIUM 75 MCG PO TABS: 75.0000 ug | ORAL_TABLET | Freq: Every day | ORAL | Status: DC

## 2016-01-15 NOTE — Telephone Encounter (Signed)
Will need to recall pharmacy as there is no answer at this time and no voicemail

## 2016-01-15 NOTE — Telephone Encounter (Addendum)
Patient would like her LEVOTHROID and Xanex medication refilled.

## 2016-01-16 NOTE — Telephone Encounter (Signed)
Phoned to pharmacy 

## 2016-02-15 ENCOUNTER — Ambulatory Visit (HOSPITAL_BASED_OUTPATIENT_CLINIC_OR_DEPARTMENT_OTHER): Payer: PPO | Admitting: Hematology

## 2016-02-15 ENCOUNTER — Other Ambulatory Visit (HOSPITAL_BASED_OUTPATIENT_CLINIC_OR_DEPARTMENT_OTHER): Payer: PPO

## 2016-02-15 ENCOUNTER — Encounter: Payer: Self-pay | Admitting: Hematology

## 2016-02-15 ENCOUNTER — Telehealth: Payer: Self-pay | Admitting: Hematology

## 2016-02-15 VITALS — BP 131/64 | HR 108 | Temp 97.7°F | Resp 18 | Ht <= 58 in | Wt 136.0 lb

## 2016-02-15 DIAGNOSIS — M810 Age-related osteoporosis without current pathological fracture: Secondary | ICD-10-CM

## 2016-02-15 DIAGNOSIS — C50911 Malignant neoplasm of unspecified site of right female breast: Secondary | ICD-10-CM

## 2016-02-15 DIAGNOSIS — Z170 Estrogen receptor positive status [ER+]: Secondary | ICD-10-CM

## 2016-02-15 DIAGNOSIS — R635 Abnormal weight gain: Secondary | ICD-10-CM | POA: Diagnosis not present

## 2016-02-15 DIAGNOSIS — M1990 Unspecified osteoarthritis, unspecified site: Secondary | ICD-10-CM

## 2016-02-15 LAB — CBC WITH DIFFERENTIAL/PLATELET
BASO%: 0.2 % (ref 0.0–2.0)
Basophils Absolute: 0 10*3/uL (ref 0.0–0.1)
EOS%: 1.7 % (ref 0.0–7.0)
Eosinophils Absolute: 0.2 10*3/uL (ref 0.0–0.5)
HEMATOCRIT: 39.7 % (ref 34.8–46.6)
HGB: 12.9 g/dL (ref 11.6–15.9)
LYMPH#: 2 10*3/uL (ref 0.9–3.3)
LYMPH%: 22.1 % (ref 14.0–49.7)
MCH: 28.5 pg (ref 25.1–34.0)
MCHC: 32.4 g/dL (ref 31.5–36.0)
MCV: 87.8 fL (ref 79.5–101.0)
MONO#: 0.8 10*3/uL (ref 0.1–0.9)
MONO%: 9.3 % (ref 0.0–14.0)
NEUT#: 6 10*3/uL (ref 1.5–6.5)
NEUT%: 66.7 % (ref 38.4–76.8)
Platelets: 261 10*3/uL (ref 145–400)
RBC: 4.52 10*6/uL (ref 3.70–5.45)
RDW: 12.8 % (ref 11.2–14.5)
WBC: 9.1 10*3/uL (ref 3.9–10.3)

## 2016-02-15 LAB — COMPREHENSIVE METABOLIC PANEL
ALK PHOS: 62 U/L (ref 40–150)
ALT: 20 U/L (ref 0–55)
ANION GAP: 10 meq/L (ref 3–11)
AST: 18 U/L (ref 5–34)
Albumin: 4 g/dL (ref 3.5–5.0)
BUN: 17.9 mg/dL (ref 7.0–26.0)
CHLORIDE: 107 meq/L (ref 98–109)
CO2: 25 meq/L (ref 22–29)
CREATININE: 1.1 mg/dL (ref 0.6–1.1)
Calcium: 9.5 mg/dL (ref 8.4–10.4)
EGFR: 49 mL/min/{1.73_m2} — ABNORMAL LOW (ref >90)
Glucose: 80 mg/dl (ref 70–140)
Potassium: 4.8 mEq/L (ref 3.5–5.1)
Sodium: 142 mEq/L (ref 136–145)
Total Bilirubin: 0.5 mg/dL (ref 0.20–1.20)
Total Protein: 7.2 g/dL (ref 6.4–8.3)

## 2016-02-15 NOTE — Telephone Encounter (Signed)
per pof to sch pt appt-gave pt copy of avs °

## 2016-02-15 NOTE — Progress Notes (Signed)
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) and adjuvant radiation ([name]).   12/13/2014 Mammogram Right breast: possible asymmetry warranting further evaluation with spot compression views and possibly ultrasound   12/16/2014 Breast US Right breast: no definitive abnormality within the superior aspect of the right breast to correspond with the mammographic finding.   01/10/2015 Breast MRI Right breast: irregular rim enhancing mass within the superior right breast at 11:30 measuring 2.5 x 2.4 x 2.4 cm. No abnormal enhancement is identified within the skin of the superior right breast in the area of concern on mammogram.   01/12/2015 Breast US Second look diagnostic mammogram and ultrasound showed a 2.3 cm mass in the upper midline right breast. No axillary adenopathy.   01/12/2015 Initial Biopsy Right breast needle core bx (upper midline): Invasive ductal carcinoma, grade 2, ER+ (100%), PR+ (77%), HER2/neu negative (ratio 1.15), Ki67 20%    01/30/2015 Procedure Breast High/Moderate Risk  panel ([facility]) reveals no clinically significant variant at ATM, BRCA1, BRCA2, CDH1, CHEK2, PALB2, PTEN, STK11, and TP53.   02/08/2015 Clinical Stage Stage IIA (T2 N0)   03/23/2015 Definitive Surgery Right mastectomy ([name]): invasive adenocarcinoma, grade 3, 2.9 cm, HER2/neu repeated, negative (ratio 1.23)   03/23/2015 Oncotype testing Score: 8 (6% ROR). No chemotherapy [name]).   03/23/2015 Pathologic Stage Stage IIA: pT2 pNx   05/04/2015 -  Anti-estrogen oral therapy Letrozole 2.29m daily. Planned duration of therapy at least 5 years.    07/25/2015 Survivorship Survivorship visit completed and copy of care plan provided to patient.    HISTORY OF PRESENTING ILLNESS:  Tara GAILLARD70y.o. female is here because of recently diagnosed right breast cancer.  She had right breast cancer in 1996, which was treated with lumpectomy and axillary lymph node dissection, followed by adjuvant chemotherapy and radiation.   She did not have (/need) adjuvant endocrine therapy.  She has been followed with annual mammograms since then.  Her routine screening mammograms on 12/13/2014 showed a possible asymmetrically in the right breast. She underwent diagnostic mammogram and ultrasound on 12/16/2014 which showed a suspicious irregular subcutaneous density within the right breast, no definitive sonographic correlation was identified. She subsequently underwent breast MRI on 01/11/2015, which showed a rim enhancing mass within the right breast at 11:30 position measuring 2.5 x 2.4 x 2.4 cm. No other lesion or adenopathy. She underwent core biopsy on 01/12/2015, which showed invasive ductal carcinoma, ER positive, PR positive, HER-2 negative, Ki-67 20%.  She feels well, did not feel any palpable mass before the screening mammogram. She denies any pain, dyspnea, fatigue or any other symptoms.  CURRENT THERAPY:   Letrozole 2.5 mg once daily, started on 05/04/2015.  INTERIM HISTORY Tara Rodgers returns for follow up. She is doing  well overall. She has been tolerating letrozole very well, no significant hot flash or worsening of her arthritis pain. Her main complaint is her weekend, she has gained about 5 pounds since she started letrozole. She states she exercises regularly, treadmill, walking the dog, and lifting. She denies any other new symptoms.  MEDICAL HISTORY:  Past Medical History  Diagnosis Date  . Breast cancer [facility]) 1995/2016    right sided breast cancer in 08/1994  . Hypertension   . Asthma     does not take any medications, hx of inhaler use  . Bronchitis     hx of  . Hyperthyroidism   . Anxiety   . GERD (gastroesophageal reflux disease)   . History of hiatal hernia     SURGICAL HISTORY: Past Surgical History  Procedure Laterality Date  . Carpel tunnel  Bilateral   . Incontinence surgery      with mesh insertion about 7 years ago  . Tonsillectomy      as a child  . Mastectomy Right 03/23/2015  . Reconstruction breast w/ latissimus dorsi flap Right 03/23/2015  . Latissimus flap to breast Right 03/23/2015    Procedure: LATISSIMUS FLAP TO BREAST WITH PLACEMENT OF IMPLANT FOR BREAST RECONSTRUCTION;  Surgeon: [name], MD;  Location: [place];  Service: Plastics;  Laterality: Right;  . Total mastectomy Right 03/23/2015    Procedure: RIGHT TOTAL MASTECTOMY;  Surgeon: [name], MD;  Location: [place];  Service: General;  Laterality: Right;    SOCIAL HISTORY: Social History   Social History  . Marital Status: Married    Spouse Name: N/A  . Number of Children: 2  . Years of Education: N/A   Occupational History  . Not on file.   Social History Main Topics  . Smoking status: Never Smoker   . Smokeless tobacco: Never Used  . Alcohol Use: Yes     Comment: occasional  . Drug Use: No  . Sexual Activity: Not on file   Other Topics Concern  . Not on file   Social History Narrative   GYN HISTORY  Menarchal: 70 yo  LMP: 1990 Contraceptive:no    HRT: one year in [place]: Family History  Problem Relation Age of Onset  . COPD Father   . Heart disease Father     ALLERGIES:  is allergic to fosamax.  MEDICATIONS:  Current Outpatient Prescriptions  Medication Sig Dispense Refill  . ALPRAZolam (XANAX) 0.5 MG tablet Take 1 tablet (0.5 mg total) by mouth 2 (two) times daily as needed. 60 tablet 5  . Calcium-Magnesium-Zinc 167-83-8 MG TABS Take 1 tablet by mouth daily.    . cyclobenzaprine (FLEXERIL) 10 MG tablet Take 1 tablet (10 mg total) by mouth 2 (two) times daily as needed. 60 tablet 5  . docusate sodium (COLACE) 100 MG capsule Take 1 capsule (100 mg total) by mouth daily. 20 capsule 0  . letrozole (FEMARA) 2.5 MG tablet Take 1 tablet (2.5 mg total) by mouth daily. 30 tablet 4  . levothyroxine (SYNTHROID, LEVOTHROID) 75 MCG tablet Take 1 tablet (75 mcg total) by mouth daily. 90 tablet 1  . pantoprazole (PROTONIX) 40 MG tablet Take 1 tablet (40 mg total) by mouth daily. 30 tablet 11  . simvastatin (ZOCOR) 20 MG tablet TAKE ONE TABLET BY MOUTH AT BEDTIME 90 tablet  3  . verapamil (CALAN-SR) 240 MG CR tablet TAKE ONE TABLET BY MOUTH DAILY 30 tablet 11   No current facility-administered medications for this visit.    REVIEW OF SYSTEMS:   Constitutional: Denies fevers, chills or abnormal night sweats Eyes: Denies blurriness of vision, double vision or watery eyes Ears, nose, mouth, throat, and face: Denies mucositis or sore throat Respiratory: Denies cough, dyspnea or wheezes Cardiovascular: Denies palpitation, chest discomfort or lower extremity swelling Gastrointestinal:  Denies nausea, heartburn or change in bowel habits Skin: Denies abnormal skin rashes Lymphatics: Denies new lymphadenopathy or easy bruising Neurological:Denies numbness, tingling or new weaknesses Behavioral/Psych: Mood is stable, no new changes  All other systems were reviewed with the patient and are negative.  PHYSICAL EXAMINATION: ECOG PERFORMANCE  STATUS: 0 - Asymptomatic  Filed Vitals:   02/15/16 1302  BP: 131/64  Pulse: 108  Temp: 97.7 F (36.5 C)  Resp: 18   Filed Weights   02/15/16 1302  Weight: 136 lb (61.689 kg)    GENERAL:alert, no distress and comfortable SKIN: skin color, texture, turgor are normal, no rashes or significant lesions EYES: normal, conjunctiva are pink and non-injected, sclera clear OROPHARYNX:no exudate, no erythema and lips, buccal mucosa, and tongue normal  NECK: supple, thyroid normal size, non-tender, without nodularity LYMPH:  no palpable lymphadenopathy in the cervical, axillary or inguinal LUNGS: clear to auscultation and percussion with normal breathing effort HEART: regular rate & rhythm and no murmurs and no lower extremity edema ABDOMEN:abdomen soft, non-tender and normal bowel sounds Musculoskeletal:no cyanosis of digits and no clubbing  PSYCH: alert & oriented x 3 with fluent speech NEURO: no focal motor/sensory deficits Breasts: Status post right simple mastectomy and reconstruction. Surgical scar is healing well. Palpation of the left breasts and axilla revealed no obvious mass that I could appreciate.   LABORATORY DATA:  I have reviewed the data as listed CBC Latest Ref Rng 02/15/2016 11/28/2015 10/17/2015  WBC 3.9 - 10.3 10e3/uL 9.1 7.2 10.3  Hemoglobin 11.6 - 15.9 g/dL 12.9 12.4 12.4  Hematocrit 34.8 - 46.6 % 39.7 37.6 38.1  Platelets 145 - 400 10e3/uL 261 288 309    CMP Latest Ref Rng 02/15/2016 11/28/2015 10/17/2015  Glucose 70 - 140 mg/dl 80 81 77  BUN 7.0 - 26.0 mg/dL 17.9 22 26.3(H)  Creatinine 0.6 - 1.1 mg/dL 1.1 1.16(H) 1.2(H)  Sodium 136 - 145 mEq/L 142 140 139  Potassium 3.5 - 5.1 mEq/L 4.8 4.8 4.7  Chloride 98 - 110 mmol/L - 105 -  CO2 22 - 29 mEq/L '25 27 23  ' Calcium 8.4 - 10.4 mg/dL 9.5 9.1 9.0  Total Protein 6.4 - 8.3 g/dL 7.2 6.6 7.4  Total Bilirubin 0.20 - 1.20 mg/dL 0.50 0.4 0.47  Alkaline Phos 40 - 150 U/L 62 55 61  AST 5 - 34 U/L '18 14 13  ' ALT 0 - 55 U/L '20 19  15     ' PATHOLOGY REPORT" Diagnosis 03/23/2015 Breast, simple mastectomy, right - INVASIVE ADENOCARCINOMA, SEE COMMENT. - PREVIOUS BIOPSY SITE PRESENT. - SEE TUMOR SYNOPTIC TEMPLATE BELOW. Microscopic Comment BREAST, INVASIVE TUMOR, WITHOUT LYMPH NODES PRESENT Specimen, including laterality: Right breast without lymph node sampling. Procedure: Simple mastectomy. Histologic type: Ductal. Grade: 3 of 3. Tubule formation: 3. Nuclear pleomorphism: 3. Mitotic: 2. Tumor size (gross measurement): 2.9 cm. Invasive, distance to closest margin: 0.5 cm (posterior). In-situ, distance to closest margin: N/A. If margin positive, focally or broadly: N/A. Lymphovascular invasion: Absent. Ductal carcinoma in situ: Absent. Grade: N/A. Extensive  intraductal component: N/A. Lobular neoplasia: Absent. Tumor focality: Unifocal. Treatment effect: None. If present, treatment effect in breast tissue, lymph nodes or both: N/A. Extent of tumor: Skin: N/A. Nipple: N/A. Skeletal muscle: Negative for tumor.  Estrogen receptor: Not repeated, previous study demonstrated 100% positivity ([id number]). Progesterone receptor: Not repeated, previous study demonstrated 77% proliferation rate ([id number]). Her 2 neu: Repeated, previous study demonstrated no amplification (1.90) ([id number]). Ki-67: Not repeated, previous study demonstrated 20% proliferation rate ([id number]). Non-neoplastic breast: Previous biopsy site, fibrocytic change, coarse vascular calcifications. TNM: pT2, pNX, pMX.  ONCOTYPE RS 8  RADIOGRAPHIC STUDIES: I have personally reviewed the radiological images as listed and agreed with the findings in the report.   MM screening tomo Left 12/15/2015  IMPRESSION: No mammographic evidence of malignancy. A result letter of this screening mammogram will be mailed directly to the patient.  RECOMMENDATION: Screening mammogram of the left breast in one year.  BI-RADS CATEGORY 1:  Negative.  ASSESSMENT & PLAN:  70 year old female, with remote history of right breast cancer in 1996, status post lumpectomy with lymph node dissection, adjuvant chemotherapy and irradiation. She was found to have a right breast mass by screening mammogram.  1. Recurrent right breast invasive ductal carcinoma, pT2N0, stage II, grade 3, ER positive, PR positive, HER-2 negative -she  Is clinically doing well, physical exam and last mammogram was negative for recurrence. - she has been tolerating letrozole very well, we'll continue, for total of at least 5 years. - she'll continue surveillance with annual mammogram, routine physical exam and lab. - lab reviewed, within normal limits. Her physical exam was unremarkable, last mammogram in March 2017 was normal. No evidence of recurrence.  2. Osteoporosis -She has known history of osteoporosis.  Last bone density scan on 05/04/2015 showed T score -2.6 - she is getting Prolia at her PCP's office  Every 6 months. -continue calcium 1 g, vitamin D at least 1000 units daily, and weight-bearing exercise distress or bone. - vitamin D level was normal on 06/13/2015.  3.  Arthritis - she will follow-up with her orthopedic surgeon for the right knee arthritis - we discussed letrozole can cause muscular and joint discomfort.  If her arthritis gets much worse, we can consider switch her to another aromatase inhibitor.  4. Weight gain -Possibly related to letrozole. We discussed healthy diet and regular exercise. -We discussed nutritional consult, she declined at this point. -She is interested in exercise and diet program, I'll refer her to our coordinator Hassan Rowan.  Plan - continue letrozole. - return to clinic in 4 months for follow-up with lab -she may participate in the wellness program in our cancer center   All questions were answered. The patient knows to call the clinic with any problems, questions or concerns. I spent 20 minutes counseling the  patient face to face. The total time spent in the appointment was 30 minutes and more than 50% was on counseling.     [name], MD 02/15/2016 1:36 PM

## 2016-02-22 DIAGNOSIS — C50911 Malignant neoplasm of unspecified site of right female breast: Secondary | ICD-10-CM | POA: Diagnosis not present

## 2016-03-07 ENCOUNTER — Other Ambulatory Visit: Payer: Self-pay | Admitting: Internal Medicine

## 2016-04-04 DIAGNOSIS — C50911 Malignant neoplasm of unspecified site of right female breast: Secondary | ICD-10-CM | POA: Diagnosis not present

## 2016-04-12 ENCOUNTER — Other Ambulatory Visit: Payer: Self-pay | Admitting: Hematology

## 2016-04-12 DIAGNOSIS — C50911 Malignant neoplasm of unspecified site of right female breast: Secondary | ICD-10-CM

## 2016-05-08 DIAGNOSIS — C50911 Malignant neoplasm of unspecified site of right female breast: Secondary | ICD-10-CM | POA: Diagnosis not present

## 2016-05-17 DIAGNOSIS — E559 Vitamin D deficiency, unspecified: Secondary | ICD-10-CM | POA: Diagnosis not present

## 2016-05-17 DIAGNOSIS — M810 Age-related osteoporosis without current pathological fracture: Secondary | ICD-10-CM | POA: Diagnosis not present

## 2016-05-17 DIAGNOSIS — R5383 Other fatigue: Secondary | ICD-10-CM | POA: Diagnosis not present

## 2016-05-24 DIAGNOSIS — E559 Vitamin D deficiency, unspecified: Secondary | ICD-10-CM | POA: Diagnosis not present

## 2016-05-24 DIAGNOSIS — M810 Age-related osteoporosis without current pathological fracture: Secondary | ICD-10-CM | POA: Diagnosis not present

## 2016-06-10 ENCOUNTER — Other Ambulatory Visit: Payer: Self-pay | Admitting: Internal Medicine

## 2016-06-20 ENCOUNTER — Ambulatory Visit (HOSPITAL_BASED_OUTPATIENT_CLINIC_OR_DEPARTMENT_OTHER): Payer: PPO | Admitting: Hematology

## 2016-06-20 ENCOUNTER — Telehealth: Payer: Self-pay | Admitting: Hematology

## 2016-06-20 ENCOUNTER — Other Ambulatory Visit (HOSPITAL_BASED_OUTPATIENT_CLINIC_OR_DEPARTMENT_OTHER): Payer: PPO

## 2016-06-20 ENCOUNTER — Encounter: Payer: Self-pay | Admitting: Hematology

## 2016-06-20 VITALS — BP 131/71 | HR 117 | Temp 98.6°F | Resp 18 | Ht <= 58 in | Wt 130.0 lb

## 2016-06-20 DIAGNOSIS — M810 Age-related osteoporosis without current pathological fracture: Secondary | ICD-10-CM

## 2016-06-20 DIAGNOSIS — Z170 Estrogen receptor positive status [ER+]: Secondary | ICD-10-CM

## 2016-06-20 DIAGNOSIS — Z79811 Long term (current) use of aromatase inhibitors: Secondary | ICD-10-CM

## 2016-06-20 DIAGNOSIS — C50911 Malignant neoplasm of unspecified site of right female breast: Secondary | ICD-10-CM | POA: Diagnosis not present

## 2016-06-20 DIAGNOSIS — M1990 Unspecified osteoarthritis, unspecified site: Secondary | ICD-10-CM

## 2016-06-20 LAB — CBC WITH DIFFERENTIAL/PLATELET
BASO%: 0.6 % (ref 0.0–2.0)
Basophils Absolute: 0.1 10*3/uL (ref 0.0–0.1)
EOS%: 2 % (ref 0.0–7.0)
Eosinophils Absolute: 0.2 10*3/uL (ref 0.0–0.5)
HCT: 39.3 % (ref 34.8–46.6)
HEMOGLOBIN: 13.3 g/dL (ref 11.6–15.9)
LYMPH%: 29.7 % (ref 14.0–49.7)
MCH: 28.8 pg (ref 25.1–34.0)
MCHC: 33.8 g/dL (ref 31.5–36.0)
MCV: 85.1 fL (ref 79.5–101.0)
MONO#: 0.6 10*3/uL (ref 0.1–0.9)
MONO%: 7.2 % (ref 0.0–14.0)
NEUT%: 60.5 % (ref 38.4–76.8)
NEUTROS ABS: 5.2 10*3/uL (ref 1.5–6.5)
Platelets: 245 10*3/uL (ref 145–400)
RBC: 4.62 10*6/uL (ref 3.70–5.45)
RDW: 13.9 % (ref 11.2–14.5)
WBC: 8.6 10*3/uL (ref 3.9–10.3)
lymph#: 2.6 10*3/uL (ref 0.9–3.3)

## 2016-06-20 LAB — COMPREHENSIVE METABOLIC PANEL
ALT: 19 U/L (ref 0–55)
AST: 19 U/L (ref 5–34)
Albumin: 4 g/dL (ref 3.5–5.0)
Alkaline Phosphatase: 54 U/L (ref 40–150)
Anion Gap: 11 mEq/L (ref 3–11)
BILIRUBIN TOTAL: 0.49 mg/dL (ref 0.20–1.20)
BUN: 18.3 mg/dL (ref 7.0–26.0)
CO2: 22 meq/L (ref 22–29)
CREATININE: 1.1 mg/dL (ref 0.6–1.1)
Calcium: 9.5 mg/dL (ref 8.4–10.4)
Chloride: 109 mEq/L (ref 98–109)
EGFR: 51 mL/min/{1.73_m2} — ABNORMAL LOW (ref >90)
GLUCOSE: 97 mg/dL (ref 70–140)
Potassium: 4.8 mEq/L (ref 3.5–5.1)
SODIUM: 142 meq/L (ref 136–145)
TOTAL PROTEIN: 7.4 g/dL (ref 6.4–8.3)

## 2016-06-20 NOTE — Progress Notes (Signed)
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) and adjuvant radiation ([name]).      12/13/2014 Mammogram    Right breast: possible asymmetry warranting further evaluation with spot compression views and possibly ultrasound      12/16/2014 Breast US    Right breast: no definitive abnormality within the superior aspect of the right breast to correspond with the mammographic finding.      01/10/2015 Breast MRI    Right breast: irregular rim enhancing mass within the superior right breast at 11:30 measuring 2.5 x 2.4 x 2.4 cm. No abnormal enhancement is identified within the skin of the superior right breast in the area of concern on mammogram.      01/12/2015 Breast US    Second look diagnostic mammogram and ultrasound showed a 2.3 cm mass in the upper midline right breast. No axillary adenopathy.      01/12/2015 Initial Biopsy    Right breast needle core bx (upper midline): Invasive ductal carcinoma, grade 2, ER+ (100%), PR+ (77%), HER2/neu negative (ratio 1.15), Ki67 20%         01/30/2015 Procedure    Breast High/Moderate Risk panel ([facility]) reveals no clinically significant variant at ATM, BRCA1, BRCA2, CDH1, CHEK2, PALB2, PTEN, STK11, and TP53.      02/08/2015 Clinical Stage    Stage IIA (T2 N0)      03/23/2015 Definitive Surgery    Right mastectomy ([name]): invasive adenocarcinoma, grade 3, 2.9 cm, HER2/neu repeated, negative (ratio 1.23)      03/23/2015 Oncotype testing    Score: 8 (6% ROR). No chemotherapy [name]).      03/23/2015 Pathologic Stage    Stage IIA: pT2 pNx      05/04/2015 -  Anti-estrogen oral therapy    Letrozole 2.54m daily. Planned duration of therapy at least 5 years.       07/25/2015 Survivorship    Survivorship visit completed and copy of care plan provided to patient.       HISTORY OF PRESENTING ILLNESS:  Tara DUFFY70y.o. female is here because of recently diagnosed right breast cancer.  She had right breast cancer in 1996, which was treated with lumpectomy and axillary lymph node dissection, followed by adjuvant chemotherapy and radiation.   She did not have (/need) adjuvant endocrine therapy.  She has been followed with annual mammograms since then.  Her routine screening mammograms on 12/13/2014 showed a possible asymmetrically in the right breast. She underwent diagnostic mammogram and ultrasound on 12/16/2014 which showed a suspicious irregular subcutaneous density within the right breast, no definitive sonographic correlation  was identified. She subsequently underwent breast MRI on 01/11/2015, which showed a rim enhancing mass within the right breast at 11:30 position measuring 2.5 x 2.4 x 2.4 cm. No other lesion or adenopathy. She underwent core biopsy on 01/12/2015, which showed invasive ductal carcinoma, ER positive, PR positive, HER-2 negative, Ki-67 20%.  She feels well, did not feel any palpable mass before the screening mammogram. She denies any pain, dyspnea, fatigue or any other symptoms.   CURRENT THERAPY:    Letrozole 2.5 mg once daily, started on 05/04/2015.  INTERIM HISTORY Sheala returns for follow up. She is doing well overall. She is tolerating letrozole very well, no significant side effects. She denies hot flash, mild arthralgia is stable. She has been on diet and exercise to lose weight, and she did lose 6 pounds in the past 4 months. No other new complaints.  MEDICAL HISTORY:  Past Medical History:  Diagnosis Date  . Anxiety   . Asthma    does not take any medications, hx of inhaler use  . Breast cancer [facility]) 1995/2016   right sided breast cancer in 08/1994  . Bronchitis    hx of  . GERD (gastroesophageal reflux disease)   . History of hiatal hernia   . Hypertension   . Hyperthyroidism     SURGICAL HISTORY: Past Surgical History:  Procedure Laterality Date  . Carpel Tunnel  Bilateral   . INCONTINENCE SURGERY     with mesh insertion about 7 years ago  . LATISSIMUS FLAP TO BREAST Right 03/23/2015   Procedure: LATISSIMUS FLAP TO BREAST WITH PLACEMENT OF IMPLANT FOR BREAST RECONSTRUCTION;  Surgeon: [name], MD;  Location: [place];  Service: Plastics;  Laterality: Right;  . MASTECTOMY Right 03/23/2015  . RECONSTRUCTION BREAST W/ LATISSIMUS DORSI FLAP Right 03/23/2015  . TONSILLECTOMY     as a child  . TOTAL MASTECTOMY Right 03/23/2015   Procedure: RIGHT TOTAL MASTECTOMY;  Surgeon: [name], MD;  Location: [facility];  Service: General;  Laterality: Right;    SOCIAL HISTORY: Social History   Social History  . Marital status: Married    Spouse name: N/A  . Number of children: 2  . Years of education: N/A   Occupational History  . Not on file.   Social History Main Topics  . Smoking status: Never Smoker  . Smokeless tobacco: Never Used  . Alcohol use Yes     Comment: occasional  . Drug use: No  . Sexual activity: Not on file   Other Topics Concern  . Not on file   Social History Narrative  . No narrative on file   GYN HISTORY  Menarchal: 70 yo  LMP:  1990 Contraceptive:no    HRT: one year in [place]: Family History  Problem Relation Age of Onset  . COPD Father   . Heart disease Father     ALLERGIES:  is allergic to fosamax [alendronate sodium].  MEDICATIONS:  Current Outpatient Prescriptions  Medication Sig Dispense Refill  . ALPRAZolam (XANAX) 0.5 MG tablet Take 1 tablet (0.5 mg total) by mouth 2 (two) times daily as needed. 60 tablet 5  . Calcium-Magnesium-Zinc 167-83-8 MG TABS Take 1 tablet by mouth daily.    . cyclobenzaprine (FLEXERIL) 10 MG tablet TAKE ONE TABLET BY MOUTH TWICE DAILY AS NEEDED. 60 tablet 5  . docusate sodium (COLACE) 100 MG capsule Take 1 capsule (100 mg total) by mouth daily. 20 capsule 0  . letrozole (FEMARA) 2.5 MG tablet  TAKE ONE TABLET BY MOUTH  DAILY 90 tablet 1  . levothyroxine (SYNTHROID, LEVOTHROID) 75 MCG tablet Take 1 tablet (75 mcg total) by mouth daily. 90 tablet 1  . pantoprazole (PROTONIX) 40 MG tablet TAKE ONE TABLET BY MOUTH DAILY 30 tablet 5  . simvastatin (ZOCOR) 20 MG tablet TAKE ONE TABLET BY MOUTH AT BEDTIME 90 tablet 3  . verapamil (CALAN-SR) 240 MG CR tablet TAKE ONE TABLET BY MOUTH DAILY 30 tablet 11   No current facility-administered medications for this visit.     REVIEW OF SYSTEMS:   Constitutional: Denies fevers, chills or abnormal night sweats Eyes: Denies blurriness of vision, double vision or watery eyes Ears, nose, mouth, throat, and face: Denies mucositis or sore throat Respiratory: Denies cough, dyspnea or wheezes Cardiovascular: Denies palpitation, chest discomfort or lower extremity swelling Gastrointestinal:  Denies nausea, heartburn or change in bowel habits Skin: Denies abnormal skin rashes Lymphatics: Denies new lymphadenopathy or easy bruising Neurological:Denies numbness, tingling or new weaknesses Behavioral/Psych: Mood is stable, no new changes  All other systems were reviewed with the patient and are negative.  PHYSICAL  EXAMINATION: ECOG PERFORMANCE STATUS: 0 - Asymptomatic  Vitals:   06/20/16 1305  BP: 131/71  Pulse: (!) 117  Resp: 18  Temp: 98.6 F (37 C)   Filed Weights   06/20/16 1305  Weight: 130 lb (59 kg)    GENERAL:alert, no distress and comfortable SKIN: skin color, texture, turgor are normal, no rashes or significant lesions EYES: normal, conjunctiva are pink and non-injected, sclera clear OROPHARYNX:no exudate, no erythema and lips, buccal mucosa, and tongue normal  NECK: supple, thyroid normal size, non-tender, without nodularity LYMPH:  no palpable lymphadenopathy in the cervical, axillary or inguinal LUNGS: clear to auscultation and percussion with normal breathing effort HEART: regular rate & rhythm and no murmurs and no lower extremity edema ABDOMEN:abdomen soft, non-tender and normal bowel sounds Musculoskeletal:no cyanosis of digits and no clubbing  PSYCH: alert & oriented x 3 with fluent speech NEURO: no focal motor/sensory deficits Breasts: Status post right simple mastectomy and reconstruction. Surgical scar is healing well. Palpation of the left breasts and axilla revealed no obvious mass that I could appreciate.   LABORATORY DATA:  I have reviewed the data as listed CBC Latest Ref Rng & Units 06/20/2016 02/15/2016 11/28/2015  WBC 3.9 - 10.3 10e3/uL 8.6 9.1 7.2  Hemoglobin 11.6 - 15.9 g/dL 13.3 12.9 12.4  Hematocrit 34.8 - 46.6 % 39.3 39.7 37.6  Platelets 145 - 400 10e3/uL 245 261 288    CMP Latest Ref Rng & Units 02/15/2016 11/28/2015 10/17/2015  Glucose 70 - 140 mg/dl 80 81 77  BUN 7.0 - 26.0 mg/dL 17.9 22 26.3(H)  Creatinine 0.6 - 1.1 mg/dL 1.1 1.16(H) 1.2(H)  Sodium 136 - 145 mEq/L 142 140 139  Potassium 3.5 - 5.1 mEq/L 4.8 4.8 4.7  Chloride 98 - 110 mmol/L - 105 -  CO2 22 - 29 mEq/L _0 Calcium 8.4 - 10.4 mg/dL 9.5 9.1 9.0  Total Protein 6.4 - 8.3 g/dL 7.2 6.6 7.4  Total Bilirubin 0.20 - 1.20 mg/dL 0.50 0.4 0.47  Alkaline Phos 40 - 150 U/L 62 55 61  AST 5 -  34 U/L _1 ALT 0 - 55 U/L _2 PATHOLOGY REPORT" Diagnosis 03/23/2015 Breast, simple mastectomy, right - INVASIVE ADENOCARCINOMA, SEE COMMENT. - PREVIOUS BIOPSY SITE PRESENT. - SEE TUMOR SYNOPTIC TEMPLATE BELOW. Microscopic Comment BREAST, INVASIVE TUMOR, WITHOUT LYMPH  NODES PRESENT Specimen, including laterality: Right breast without lymph node sampling. Procedure: Simple mastectomy. Histologic type: Ductal. Grade: 3 of 3. Tubule formation: 3. Nuclear pleomorphism: 3. Mitotic: 2. Tumor size (gross measurement): 2.9 cm. Invasive, distance to closest margin: 0.5 cm (posterior). In-situ, distance to closest margin: N/A. If margin positive, focally or broadly: N/A. Lymphovascular invasion: Absent. Ductal carcinoma in situ: Absent. Grade: N/A. Extensive intraductal component: N/A. Lobular neoplasia: Absent. Tumor focality: Unifocal. Treatment effect: None. If present, treatment effect in breast tissue, lymph nodes or both: N/A. Extent of tumor: Skin: N/A. Nipple: N/A. Skeletal muscle: Negative for tumor.  Estrogen receptor: Not repeated, previous study demonstrated 100% positivity ([id number]). Progesterone receptor: Not repeated, previous study demonstrated 77% proliferation rate ([id number]). Her 2 neu: Repeated, previous study demonstrated no amplification (1.90) ([id number]). Ki-67: Not repeated, previous study demonstrated 20% proliferation rate ([id number]). Non-neoplastic breast: Previous biopsy site, fibrocytic change, coarse vascular calcifications. TNM: pT2, pNX, pMX.  ONCOTYPE RS 8  RADIOGRAPHIC STUDIES: I have personally reviewed the radiological images as listed and agreed with the findings in the report.   MM screening tomo Left 12/15/2015  IMPRESSION: No mammographic evidence of malignancy. A result letter of this screening mammogram will be mailed directly to the patient.  RECOMMENDATION: Screening mammogram of the left breast in one  year.  BI-RADS CATEGORY 1: Negative.  ASSESSMENT & PLAN:  70 year old female, with remote history of right breast cancer in 1996, status post lumpectomy with lymph node dissection, adjuvant chemotherapy and irradiation. She was found to have a right breast mass by screening mammogram.  1. Recurrent right breast invasive ductal carcinoma, pT2N0, stage II, grade 3, ER positive, PR positive, HER-2 negative -she  Is clinically doing well, physical exam and last mammogram was negative for recurrence. - she has been tolerating letrozole very well, we'll continue, for total of at least 5-10 years. - she'll continue surveillance with annual mammogram, routine physical exam and lab. - lab reviewed, within normal limits. Her physical exam was unremarkable, last mammogram in March 2017 was normal. No evidence of recurrence. -I encouraged her to continue healthy diet and exercise, she has successfully lost about 6 pounds in the past 4 months  2. Osteoporosis -She has known history of osteoporosis.  Last bone density scan on 05/04/2015 showed T score -2.6 - she is getting Prolia at her PCP's office  Every 6 months. -continue calcium 1 g, vitamin D at least 1000 units daily, and weight-bearing exercise distress or bone. - vitamin D level was normal on 06/13/2015.  3.  Arthritis - she will follow-up with her orthopedic surgeon for the right knee arthritis - we discussed letrozole can cause muscular and joint discomfort.  Her arthralgia has been stable since she started letrozole.   Plan - continue letrozole. - return to clinic in 4 months for follow-up with lab, I'll see her every 6 months after next visit if she is doing well  All questions were answered. The patient knows to call the clinic with any problems, questions or concerns. I spent 20 minutes counseling the patient face to face. The total time spent in the appointment was 30 minutes and more than 50% was on counseling.     [name],  MD 06/20/2016 1:09 PM

## 2016-06-20 NOTE — Telephone Encounter (Signed)
Gave patient avs report and appointments for January  °

## 2016-06-21 ENCOUNTER — Other Ambulatory Visit: Payer: PPO | Admitting: Internal Medicine

## 2016-06-21 DIAGNOSIS — E039 Hypothyroidism, unspecified: Secondary | ICD-10-CM

## 2016-06-21 LAB — TSH: TSH: 0.27 m[IU]/L — AB

## 2016-06-24 ENCOUNTER — Ambulatory Visit (INDEPENDENT_AMBULATORY_CARE_PROVIDER_SITE_OTHER): Payer: PPO | Admitting: Internal Medicine

## 2016-06-24 ENCOUNTER — Encounter: Payer: Self-pay | Admitting: Internal Medicine

## 2016-06-24 VITALS — BP 120/72 | HR 119 | Temp 98.9°F | Ht <= 58 in | Wt 129.5 lb

## 2016-06-24 DIAGNOSIS — F411 Generalized anxiety disorder: Secondary | ICD-10-CM | POA: Diagnosis not present

## 2016-06-24 DIAGNOSIS — C50911 Malignant neoplasm of unspecified site of right female breast: Secondary | ICD-10-CM

## 2016-06-24 DIAGNOSIS — M810 Age-related osteoporosis without current pathological fracture: Secondary | ICD-10-CM

## 2016-06-24 DIAGNOSIS — J309 Allergic rhinitis, unspecified: Secondary | ICD-10-CM | POA: Diagnosis not present

## 2016-06-24 DIAGNOSIS — E039 Hypothyroidism, unspecified: Secondary | ICD-10-CM

## 2016-06-24 DIAGNOSIS — Z23 Encounter for immunization: Secondary | ICD-10-CM | POA: Diagnosis not present

## 2016-06-24 DIAGNOSIS — I10 Essential (primary) hypertension: Secondary | ICD-10-CM

## 2016-06-24 DIAGNOSIS — E785 Hyperlipidemia, unspecified: Secondary | ICD-10-CM

## 2016-06-24 NOTE — Patient Instructions (Signed)
It was a pleasure to see you today. Continue with diet and exercise efforts. Continue same medications and return in March for physical examination.

## 2016-06-24 NOTE — Progress Notes (Signed)
   Subjective:    Patient ID: Tara Rodgers, female    DOB: September 15, 1946, 70 y.o.   MRN: [id number]  HPI a 70 year old Female with osteoporosis on Prolia per Dr. Layne Benton in for 6 month recheck.   Hx hypertension, anxiety, hypothyroidism,Multinodular goiter ,allergic rhinitis, hx of breast cancer.  History of hyperlipidemia treated with generic Zocor. Lipid panel not checked today.  TSH is a bit low at 0.27 but she feels fine and is been on current dose of thyroid replacement for some time.  History of breast cancer with right lumpectomy and axillary node dissection in 1996. In June 2016 she underwent right mastectomy with reconstruction surgery for invasive ductal carcinoma. She is on Femara.  Remote history of PVCs treated with verapamil which she also takes for hypertension.  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    Review of Systems     Objective:   Physical Exam  Neck supple. No significant thyromegaly. Chest clear to auscultation. Cardiac exam regular rate and rhythm normal S1 and S2. Extremities without edema. Blood pressure stable and under good control.      Assessment & Plan:  TSH is slightly low on current dose of thyroid replacement. Reevaluate in 6 months. She feels fine on this dose has been on this dose for some time.  Hypothyroidism  History of breast cancer-on Femara and followed by oncologist  Hyperlipidemia-continue statin medication. Lipid panel not checked with this visit  Essential hypertension-stable  Allergic rhinitis  Anxiety  Osteoporosis-treated with probably up with Dr. Layne Benton  Plan: Flu vaccine given. Refill medications for 6 months. Physical exam but for March 2018.

## 2016-07-12 DIAGNOSIS — M8589 Other specified disorders of bone density and structure, multiple sites: Secondary | ICD-10-CM | POA: Diagnosis not present

## 2016-07-12 DIAGNOSIS — M810 Age-related osteoporosis without current pathological fracture: Secondary | ICD-10-CM | POA: Diagnosis not present

## 2016-09-09 ENCOUNTER — Telehealth: Payer: Self-pay | Admitting: Internal Medicine

## 2016-09-09 MED ORDER — ALPRAZOLAM 0.5 MG PO TABS: 0.5000 mg | ORAL_TABLET | Freq: Two times a day (BID) | ORAL | 5 refills | Status: DC | PRN

## 2016-09-09 NOTE — Telephone Encounter (Signed)
Xanax called into [facility].

## 2016-09-09 NOTE — Telephone Encounter (Signed)
Please refill x 6 months- never got request

## 2016-09-09 NOTE — Telephone Encounter (Signed)
Patient is calling for a refill on her Xanax 0.5 mg.  States that she called [facility] for refill.  We do not have their request yet.  Advised patient I would put the request in for her.    Thank you.

## 2016-09-19 ENCOUNTER — Encounter: Payer: Self-pay | Admitting: Internal Medicine

## 2016-09-19 ENCOUNTER — Ambulatory Visit (INDEPENDENT_AMBULATORY_CARE_PROVIDER_SITE_OTHER): Payer: PPO | Admitting: Internal Medicine

## 2016-09-19 VITALS — BP 120/80 | HR 130 | Temp 99.5°F | Ht <= 58 in | Wt 123.0 lb

## 2016-09-19 DIAGNOSIS — J069 Acute upper respiratory infection, unspecified: Secondary | ICD-10-CM | POA: Diagnosis not present

## 2016-09-19 MED ORDER — LEVOFLOXACIN 500 MG PO TABS: 500.0000 mg | ORAL_TABLET | Freq: Every day | ORAL | 0 refills | Status: DC

## 2016-09-19 MED ORDER — HYDROCODONE-HOMATROPINE 5-1.5 MG/5ML PO SYRP: 5.0000 mL | ORAL_SOLUTION | Freq: Three times a day (TID) | ORAL | 0 refills | Status: DC | PRN

## 2016-09-19 NOTE — Progress Notes (Signed)
   Subjective:    Patient ID: Tara Rodgers, female    DOB: [birth year], 70 y.o.   MRN: [id number]  HPI Patient in today with sore throat and URI symptoms. Has had low-grade fever. Has malaise and fatigue.    Review of Systems as above     Objective:   Physical Exam Skin pale warm and dry. Nodes none. Pharynx injected without exudate. TMs are slightly full. Neck supple without significant adenopathy. Chest clear to auscultation.       Assessment & Plan:  Pharyngitis  Acute URI  Plan: Levaquin 500 milligrams daily for 10 days. Hycodan 1 teaspoon by mouth every 8 hours when necessary cough. Rest and drink plenty of fluids.

## 2016-09-19 NOTE — Patient Instructions (Signed)
Levaquin 500 mg daily x 10 days. Hycodan one tsp po q 8 hours prn cough.

## 2016-10-12 ENCOUNTER — Encounter: Payer: Self-pay | Admitting: Internal Medicine

## 2016-10-15 ENCOUNTER — Other Ambulatory Visit: Payer: Self-pay | Admitting: Hematology

## 2016-10-15 DIAGNOSIS — C50911 Malignant neoplasm of unspecified site of right female breast: Secondary | ICD-10-CM

## 2016-10-17 ENCOUNTER — Ambulatory Visit (HOSPITAL_BASED_OUTPATIENT_CLINIC_OR_DEPARTMENT_OTHER): Payer: PPO | Admitting: Hematology

## 2016-10-17 ENCOUNTER — Other Ambulatory Visit (HOSPITAL_BASED_OUTPATIENT_CLINIC_OR_DEPARTMENT_OTHER): Payer: PPO

## 2016-10-17 ENCOUNTER — Telehealth: Payer: Self-pay | Admitting: Hematology

## 2016-10-17 ENCOUNTER — Encounter: Payer: Self-pay | Admitting: Hematology

## 2016-10-17 VITALS — BP 138/75 | HR 124 | Temp 98.1°F | Resp 16 | Wt 122.5 lb

## 2016-10-17 DIAGNOSIS — Z170 Estrogen receptor positive status [ER+]: Secondary | ICD-10-CM | POA: Diagnosis not present

## 2016-10-17 DIAGNOSIS — M810 Age-related osteoporosis without current pathological fracture: Secondary | ICD-10-CM | POA: Diagnosis not present

## 2016-10-17 DIAGNOSIS — I10 Essential (primary) hypertension: Secondary | ICD-10-CM

## 2016-10-17 DIAGNOSIS — C50911 Malignant neoplasm of unspecified site of right female breast: Secondary | ICD-10-CM

## 2016-10-17 DIAGNOSIS — M1990 Unspecified osteoarthritis, unspecified site: Secondary | ICD-10-CM

## 2016-10-17 DIAGNOSIS — Z79811 Long term (current) use of aromatase inhibitors: Secondary | ICD-10-CM

## 2016-10-17 LAB — CBC WITH DIFFERENTIAL/PLATELET
BASO%: 1.3 % (ref 0.0–2.0)
Basophils Absolute: 0.1 10*3/uL (ref 0.0–0.1)
EOS ABS: 0.3 10*3/uL (ref 0.0–0.5)
EOS%: 3.7 % (ref 0.0–7.0)
HEMATOCRIT: 39.8 % (ref 34.8–46.6)
HEMOGLOBIN: 13.1 g/dL (ref 11.6–15.9)
LYMPH#: 2.3 10*3/uL (ref 0.9–3.3)
LYMPH%: 28.7 % (ref 14.0–49.7)
MCH: 29 pg (ref 25.1–34.0)
MCHC: 32.9 g/dL (ref 31.5–36.0)
MCV: 88.2 fL (ref 79.5–101.0)
MONO#: 0.6 10*3/uL (ref 0.1–0.9)
MONO%: 6.9 % (ref 0.0–14.0)
NEUT%: 59.4 % (ref 38.4–76.8)
NEUTROS ABS: 4.9 10*3/uL (ref 1.5–6.5)
PLATELETS: 254 10*3/uL (ref 145–400)
RBC: 4.52 10*6/uL (ref 3.70–5.45)
RDW: 13.3 % (ref 11.2–14.5)
WBC: 8.2 10*3/uL (ref 3.9–10.3)

## 2016-10-17 LAB — COMPREHENSIVE METABOLIC PANEL
ALBUMIN: 4.2 g/dL (ref 3.5–5.0)
ALT: 16 U/L (ref 0–55)
ANION GAP: 12 meq/L — AB (ref 3–11)
AST: 15 U/L (ref 5–34)
Alkaline Phosphatase: 69 U/L (ref 40–150)
BILIRUBIN TOTAL: 0.52 mg/dL (ref 0.20–1.20)
BUN: 20.1 mg/dL (ref 7.0–26.0)
CO2: 22 meq/L (ref 22–29)
Calcium: 9.6 mg/dL (ref 8.4–10.4)
Chloride: 107 mEq/L (ref 98–109)
Creatinine: 1.1 mg/dL (ref 0.6–1.1)
EGFR: 53 mL/min/{1.73_m2} — AB (ref >90)
Glucose: 74 mg/dl (ref 70–140)
Potassium: 4.5 mEq/L (ref 3.5–5.1)
Sodium: 141 mEq/L (ref 136–145)
TOTAL PROTEIN: 7.4 g/dL (ref 6.4–8.3)

## 2016-10-17 NOTE — Telephone Encounter (Signed)
Appointments scheduled per 10/17/16 los. Patient was given a copy of the AVS report and appointment schedule per 10/17/16 los.

## 2016-10-17 NOTE — Progress Notes (Signed)
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) and adjuvant radiation ([name]).      12/13/2014 Mammogram    Right breast: possible asymmetry warranting further evaluation with spot compression views and possibly ultrasound      12/16/2014 Breast US    Right breast: no definitive abnormality within the superior aspect of the right breast to correspond with the mammographic finding.      01/10/2015 Breast MRI    Right breast: irregular rim enhancing mass within the superior right breast at 11:30 measuring 2.5 x 2.4 x 2.4 cm. No abnormal enhancement is identified within the skin of the superior right breast in the area of concern on mammogram.      01/12/2015 Breast US    Second look diagnostic mammogram and ultrasound showed a 2.3 cm mass in the upper midline right breast. No axillary adenopathy.      01/12/2015 Initial Biopsy    Right breast needle core bx (upper midline): Invasive ductal carcinoma, grade 2, ER+ (100%), PR+ (77%), HER2/neu negative (ratio 1.15), Ki67 20%         01/30/2015 Procedure    Breast High/Moderate Risk panel ([facility]) reveals no clinically significant variant at ATM, BRCA1, BRCA2, CDH1, CHEK2, PALB2, PTEN, STK11, and TP53.      02/08/2015 Clinical Stage    Stage IIA (T2 N0)      03/23/2015 Definitive Surgery    Right mastectomy ([name]): invasive adenocarcinoma, grade 3, 2.9 cm, HER2/neu repeated, negative (ratio 1.23)      03/23/2015 Oncotype testing    Score: 8 (6% ROR). No chemotherapy [name]).      03/23/2015 Pathologic Stage    Stage IIA: pT2 pNx      05/04/2015 -  Anti-estrogen oral therapy    Letrozole 2.79m daily. Planned duration of therapy at least 5 years.       07/25/2015 Survivorship    Survivorship visit completed and copy of care plan provided to patient.       HISTORY OF PRESENTING ILLNESS:  Tara GOWELL71y.o. female is here because of recently diagnosed right breast cancer.  She had right breast cancer in 1996, which was treated with lumpectomy and axillary lymph node dissection, followed by adjuvant chemotherapy and radiation.   She did not have (/need) adjuvant endocrine therapy.  She has been followed with annual mammograms since then.  Her routine screening mammograms on 12/13/2014 showed a possible asymmetrically in the right breast. She underwent diagnostic mammogram and ultrasound on 12/16/2014 which showed a suspicious irregular subcutaneous density within the right breast, no definitive sonographic correlation  was identified. She subsequently underwent breast MRI on 01/11/2015, which showed a rim enhancing mass within the right breast at 11:30 position measuring 2.5 x 2.4 x 2.4 cm. No other lesion or adenopathy. She underwent core biopsy on 01/12/2015, which showed invasive ductal carcinoma, ER positive, PR positive, HER-2 negative, Ki-67 20%.  She feels well, did not feel any palpable mass before the screening mammogram. She denies any pain, dyspnea, fatigue or any other symptoms.   CURRENT THERAPY:    Letrozole 2.5 mg once daily, started on 05/04/2015.  INTERIM HISTORY [place] returns for follow up. She is doing well overall. She is tolerating letrozole very well, no significant side effects. She has some tingling on her right fingers, likely secondary to carpal tunnel syndrome. She also has mild right knee pain, for which she takes Advil. No other complaints. She has good appetite and energy level, her weight has been stable.  MEDICAL HISTORY:  Past Medical History:  Diagnosis Date  . Anxiety   . Asthma    does not take any medications, hx of inhaler use  . Breast cancer [facility]) 1995/2016   right sided breast cancer in 08/1994  . Bronchitis    hx of  . GERD (gastroesophageal reflux disease)   . History of hiatal hernia   . Hypertension   . Hyperthyroidism     SURGICAL HISTORY: Past Surgical History:  Procedure Laterality Date  . Carpel Tunnel  Bilateral   . INCONTINENCE SURGERY     with mesh insertion about 7 years ago  . LATISSIMUS FLAP TO BREAST Right 03/23/2015   Procedure: LATISSIMUS FLAP TO BREAST WITH PLACEMENT OF IMPLANT FOR BREAST RECONSTRUCTION;  Surgeon: [name], MD;  Location: [place];  Service: Plastics;  Laterality: Right;  . MASTECTOMY Right 03/23/2015  . RECONSTRUCTION BREAST W/ LATISSIMUS DORSI FLAP Right 03/23/2015  . TONSILLECTOMY     as a child  . TOTAL MASTECTOMY Right 03/23/2015   Procedure: RIGHT TOTAL MASTECTOMY;  Surgeon: [name], MD;  Location: [place];  Service: General;  Laterality: Right;    SOCIAL HISTORY: Social History   Social History  . Marital status: Married    Spouse name: N/A  . Number of children: 2  . Years of education: N/A   Occupational History  . Not on file.   Social History Main Topics  . Smoking status: Never Smoker  . Smokeless tobacco: Never Used  . Alcohol use Yes     Comment: occasional  . Drug use: No  . Sexual activity: Not on file   Other Topics Concern  . Not on file   Social History Narrative   . No narrative on file   GYN HISTORY  Menarchal: 71 yo  LMP: 1990 Contraceptive:no    HRT: one year in [place]: Family History  Problem Relation Age of Onset  . COPD Father   . Heart disease Father     ALLERGIES:  is allergic to fosamax [alendronate sodium].  MEDICATIONS:  Current Outpatient Prescriptions  Medication Sig Dispense Refill  . ALPRAZolam (XANAX) 0.5 MG tablet Take 1 tablet (0.5 mg total) by mouth 2 (two) times daily as needed. 60 tablet 5  . Calcium-Magnesium-Zinc 167-83-8 MG TABS Take 1 tablet by mouth daily.    . cyclobenzaprine (FLEXERIL) 10 MG tablet TAKE ONE TABLET BY MOUTH TWICE DAILY AS NEEDED. 60 tablet 5  . letrozole (FEMARA) 2.5 MG tablet TAKE ONE TABLET BY MOUTH ONCE DAILY 90 tablet 1  .  levothyroxine (SYNTHROID, LEVOTHROID) 75 MCG tablet Take 1 tablet (75 mcg total) by mouth daily. 90 tablet 1  . pantoprazole (PROTONIX) 40 MG tablet TAKE ONE TABLET BY MOUTH DAILY 30 tablet 5  . simvastatin (ZOCOR) 20 MG tablet TAKE ONE TABLET BY MOUTH AT BEDTIME 90 tablet 3  . verapamil (CALAN-SR) 240 MG CR tablet TAKE ONE TABLET BY MOUTH DAILY 30 tablet 11  . docusate sodium (COLACE) 100 MG capsule Take 1 capsule (100 mg total) by mouth daily. (Patient not taking: Reported on 10/17/2016) 20 capsule 0   No current facility-administered medications for this visit.     REVIEW OF SYSTEMS:   Constitutional: Denies fevers, chills or abnormal night sweats Eyes: Denies blurriness of vision, double vision or watery eyes Ears, nose, mouth, throat, and face: Denies mucositis or sore throat Respiratory: Denies cough, dyspnea or wheezes Cardiovascular: Denies palpitation, chest discomfort or lower extremity swelling Gastrointestinal:  Denies nausea, heartburn or change in bowel habits Skin: Denies abnormal skin rashes Lymphatics: Denies new lymphadenopathy or easy bruising Neurological:Denies numbness, tingling or new weaknesses Behavioral/Psych: Mood is  stable, no new changes  All other systems were reviewed with the patient and are negative.  PHYSICAL EXAMINATION: ECOG PERFORMANCE STATUS: 0 - Asymptomatic  Vitals:   10/17/16 1317  BP: 138/75  Pulse: (!) 124  Resp: 16  Temp: 98.1 F (36.7 C)   Filed Weights   10/17/16 1317  Weight: 122 lb 8 oz (55.6 kg)    GENERAL:alert, no distress and comfortable SKIN: skin color, texture, turgor are normal, no rashes or significant lesions EYES: normal, conjunctiva are pink and non-injected, sclera clear OROPHARYNX:no exudate, no erythema and lips, buccal mucosa, and tongue normal  NECK: supple, thyroid normal size, non-tender, without nodularity LYMPH:  no palpable lymphadenopathy in the cervical, axillary or inguinal LUNGS: clear to auscultation and percussion with normal breathing effort HEART: regular rate & rhythm and no murmurs and no lower extremity edema ABDOMEN:abdomen soft, non-tender and normal bowel sounds Musculoskeletal:no cyanosis of digits and no clubbing  PSYCH: alert & oriented x 3 with fluent speech NEURO: no focal motor/sensory deficits Breasts: Status post right simple mastectomy and reconstruction. Surgical scar is healing well. Palpation of the left breasts and axilla revealed no obvious mass that I could appreciate.   LABORATORY DATA:  I have reviewed the data as listed CBC Latest Ref Rng & Units 10/17/2016 06/20/2016 02/15/2016  WBC 3.9 - 10.3 10e3/uL 8.2 8.6 9.1  Hemoglobin 11.6 - 15.9 g/dL 13.1 13.3 12.9  Hematocrit 34.8 - 46.6 % 39.8 39.3 39.7  Platelets 145 - 400 10e3/uL 254 245 261    CMP Latest Ref Rng & Units 10/17/2016 06/20/2016 02/15/2016  Glucose 70 - 140 mg/dl 74 97 80  BUN 7.0 - 26.0 mg/dL 20.1 18.3 17.9  Creatinine 0.6 - 1.1 mg/dL 1.1 1.1 1.1  Sodium 136 - 145 mEq/L 141 142 142  Potassium 3.5 - 5.1 mEq/L 4.5 4.8 4.8  Chloride 98 - 110 mmol/L - - -  CO2 22 - 29 mEq/L '22 22 25  ' Calcium 8.4 - 10.4 mg/dL 9.6 9.5 9.5  Total Protein 6.4 - 8.3 g/dL 7.4 7.4  7.2  Total Bilirubin 0.20 - 1.20 mg/dL 0.52 0.49 0.50  Alkaline Phos 40 - 150 U/L 69 54 62  AST 5 - 34 U/L '15 19 18  ' ALT 0 - 55 U/L '16 19 20     ' PATHOLOGY REPORT" Diagnosis 03/23/2015 Breast, simple mastectomy, right - INVASIVE ADENOCARCINOMA, SEE COMMENT. - PREVIOUS  BIOPSY SITE PRESENT. - SEE TUMOR SYNOPTIC TEMPLATE BELOW. Microscopic Comment BREAST, INVASIVE TUMOR, WITHOUT LYMPH NODES PRESENT Specimen, including laterality: Right breast without lymph node sampling. Procedure: Simple mastectomy. Histologic type: Ductal. Grade: 3 of 3. Tubule formation: 3. Nuclear pleomorphism: 3. Mitotic: 2. Tumor size (gross measurement): 2.9 cm. Invasive, distance to closest margin: 0.5 cm (posterior). In-situ, distance to closest margin: N/A. If margin positive, focally or broadly: N/A. Lymphovascular invasion: Absent. Ductal carcinoma in situ: Absent. Grade: N/A. Extensive intraductal component: N/A. Lobular neoplasia: Absent. Tumor focality: Unifocal. Treatment effect: None. If present, treatment effect in breast tissue, lymph nodes or both: N/A. Extent of tumor: Skin: N/A. Nipple: N/A. Skeletal muscle: Negative for tumor.  Estrogen receptor: Not repeated, previous study demonstrated 100% positivity ([id number]). Progesterone receptor: Not repeated, previous study demonstrated 77% proliferation rate ([id number]). Her 2 neu: Repeated, previous study demonstrated no amplification (1.90) ([id number]). Ki-67: Not repeated, previous study demonstrated 20% proliferation rate ([id number]). Non-neoplastic breast: Previous biopsy site, fibrocytic change, coarse vascular calcifications. TNM: pT2, pNX, pMX.  ONCOTYPE RS 8  RADIOGRAPHIC STUDIES: I have personally reviewed the radiological images as listed and agreed with the findings in the report.   MM screening tomo Left 12/15/2015  IMPRESSION: No mammographic evidence of malignancy. A result letter of this screening mammogram will be  mailed directly to the patient.  RECOMMENDATION: Screening mammogram of the left breast in one year.  BI-RADS CATEGORY 1: Negative.  ASSESSMENT & PLAN:  71 y.o. female, with remote history of right breast cancer in 1996, status post lumpectomy with lymph node dissection, adjuvant chemotherapy and irradiation. She was found to have a right breast mass by screening mammogram.  1. Recurrent right breast invasive ductal carcinoma, pT2N0, stage II, grade 3, ER positive, PR positive, HER-2 negative -she  Is clinically doing well, physical exam and last mammogram was negative for recurrence. - she has been tolerating letrozole very well, we'll continue, for total of at least 5-10 years. - she'll continue surveillance with annual mammogram, routine physical exam and lab. - lab reviewed, within normal limits. Her physical exam was unremarkable, last mammogram in March 2017 was normal. No evidence of recurrence. -I encouraged her to continue healthy diet and exercise, and watch her weight.   2. Osteoporosis -She has known history of osteoporosis.  Last bone density scan on 05/04/2015 showed T score -2.6 - she is getting Prolia at her PCP's office  Every 6 months. -continue calcium 1 g, vitamin D at least 1000 units daily, and weight-bearing exercise distress or bone. - vitamin D level was normal on 06/13/2015.  3.  Arthritis - she will follow-up with her orthopedic surgeon for the right knee arthritis - we discussed letrozole can cause muscular and joint discomfort.  Her arthralgia has been stable since she started letrozole.   Plan - continue letrozole. - return to clinic in 6 months for follow-up with lab. -Diagnostic left breast mammogram in March 2018  All questions were answered. The patient knows to call the clinic with any problems, questions or concerns. I spent 15 minutes counseling the patient face to face. The total time spent in the appointment was 20 minutes and more than 50%  was on counseling.     [name], MD 10/17/2016

## 2016-10-27 ENCOUNTER — Other Ambulatory Visit: Payer: Self-pay | Admitting: Internal Medicine

## 2016-10-28 ENCOUNTER — Other Ambulatory Visit: Payer: Self-pay | Admitting: Internal Medicine

## 2016-10-28 MED ORDER — LEVOTHYROXINE SODIUM 75 MCG PO TABS: 75.0000 ug | ORAL_TABLET | Freq: Every day | ORAL | 1 refills | Status: DC

## 2016-11-22 DIAGNOSIS — R5383 Other fatigue: Secondary | ICD-10-CM | POA: Diagnosis not present

## 2016-11-22 DIAGNOSIS — M810 Age-related osteoporosis without current pathological fracture: Secondary | ICD-10-CM | POA: Diagnosis not present

## 2016-11-22 DIAGNOSIS — E559 Vitamin D deficiency, unspecified: Secondary | ICD-10-CM | POA: Diagnosis not present

## 2016-11-27 DIAGNOSIS — M810 Age-related osteoporosis without current pathological fracture: Secondary | ICD-10-CM | POA: Diagnosis not present

## 2016-11-27 DIAGNOSIS — E559 Vitamin D deficiency, unspecified: Secondary | ICD-10-CM | POA: Diagnosis not present

## 2016-12-04 ENCOUNTER — Other Ambulatory Visit: Payer: Self-pay | Admitting: Internal Medicine

## 2016-12-06 ENCOUNTER — Other Ambulatory Visit: Payer: Self-pay | Admitting: Hematology

## 2016-12-06 DIAGNOSIS — Z1231 Encounter for screening mammogram for malignant neoplasm of breast: Secondary | ICD-10-CM

## 2016-12-17 ENCOUNTER — Other Ambulatory Visit: Payer: PPO | Admitting: Internal Medicine

## 2016-12-17 DIAGNOSIS — M810 Age-related osteoporosis without current pathological fracture: Secondary | ICD-10-CM

## 2016-12-17 DIAGNOSIS — Z0000 Encounter for general adult medical examination without abnormal findings: Secondary | ICD-10-CM

## 2016-12-17 DIAGNOSIS — E039 Hypothyroidism, unspecified: Secondary | ICD-10-CM | POA: Diagnosis not present

## 2016-12-17 DIAGNOSIS — E785 Hyperlipidemia, unspecified: Secondary | ICD-10-CM | POA: Diagnosis not present

## 2016-12-17 LAB — COMPREHENSIVE METABOLIC PANEL
ALT: 13 U/L (ref 6–29)
AST: 14 U/L (ref 10–35)
Albumin: 4.1 g/dL (ref 3.6–5.1)
Alkaline Phosphatase: 58 U/L (ref 33–130)
BUN: 20 mg/dL (ref 7–25)
CHLORIDE: 109 mmol/L (ref 98–110)
CO2: 24 mmol/L (ref 20–31)
CREATININE: 1.01 mg/dL — AB (ref 0.50–0.99)
Calcium: 8.9 mg/dL (ref 8.6–10.4)
GLUCOSE: 92 mg/dL (ref 65–99)
POTASSIUM: 4.9 mmol/L (ref 3.5–5.3)
SODIUM: 142 mmol/L (ref 135–146)
TOTAL PROTEIN: 6.4 g/dL (ref 6.1–8.1)
Total Bilirubin: 0.4 mg/dL (ref 0.2–1.2)

## 2016-12-17 LAB — CBC WITH DIFFERENTIAL/PLATELET
BASOS PCT: 1 %
Basophils Absolute: 63 cells/uL (ref 0–200)
EOS PCT: 3 %
Eosinophils Absolute: 189 cells/uL (ref 15–500)
HCT: 37.8 % (ref 35.0–45.0)
Hemoglobin: 12.5 g/dL (ref 11.7–15.5)
LYMPHS PCT: 30 %
Lymphs Abs: 1890 cells/uL (ref 850–3900)
MCH: 29.3 pg (ref 27.0–33.0)
MCHC: 33.1 g/dL (ref 32.0–36.0)
MCV: 88.7 fL (ref 80.0–100.0)
MONOS PCT: 7 %
MPV: 9.5 fL (ref 7.5–12.5)
Monocytes Absolute: 441 cells/uL (ref 200–950)
NEUTROS ABS: 3717 {cells}/uL (ref 1500–7800)
Neutrophils Relative %: 59 %
PLATELETS: 239 10*3/uL (ref 140–400)
RBC: 4.26 MIL/uL (ref 3.80–5.10)
RDW: 13.8 % (ref 11.0–15.0)
WBC: 6.3 10*3/uL (ref 3.8–10.8)

## 2016-12-17 LAB — LIPID PANEL
CHOL/HDL RATIO: 2.6 ratio (ref <5.0)
CHOLESTEROL: 151 mg/dL (ref <200)
HDL: 59 mg/dL (ref >50)
LDL Cholesterol: 58 mg/dL (ref <100)
Triglycerides: 168 mg/dL — ABNORMAL HIGH (ref <150)
VLDL: 34 mg/dL — AB (ref <30)

## 2016-12-17 LAB — TSH: TSH: 0.14 m[IU]/L — AB

## 2016-12-18 LAB — VITAMIN D 25 HYDROXY (VIT D DEFICIENCY, FRACTURES): VIT D 25 HYDROXY: 39 ng/mL (ref 30–100)

## 2016-12-20 ENCOUNTER — Encounter: Payer: Self-pay | Admitting: Internal Medicine

## 2016-12-20 ENCOUNTER — Ambulatory Visit (INDEPENDENT_AMBULATORY_CARE_PROVIDER_SITE_OTHER): Payer: PPO | Admitting: Internal Medicine

## 2016-12-20 VITALS — BP 120/80 | HR 104 | Ht <= 58 in | Wt 123.3 lb

## 2016-12-20 DIAGNOSIS — K219 Gastro-esophageal reflux disease without esophagitis: Secondary | ICD-10-CM

## 2016-12-20 DIAGNOSIS — C50911 Malignant neoplasm of unspecified site of right female breast: Secondary | ICD-10-CM | POA: Diagnosis not present

## 2016-12-20 DIAGNOSIS — M810 Age-related osteoporosis without current pathological fracture: Secondary | ICD-10-CM | POA: Diagnosis not present

## 2016-12-20 DIAGNOSIS — Z8709 Personal history of other diseases of the respiratory system: Secondary | ICD-10-CM | POA: Diagnosis not present

## 2016-12-20 DIAGNOSIS — I10 Essential (primary) hypertension: Secondary | ICD-10-CM | POA: Diagnosis not present

## 2016-12-20 DIAGNOSIS — Z0000 Encounter for general adult medical examination without abnormal findings: Secondary | ICD-10-CM | POA: Diagnosis not present

## 2016-12-20 DIAGNOSIS — J302 Other seasonal allergic rhinitis: Secondary | ICD-10-CM | POA: Diagnosis not present

## 2016-12-20 DIAGNOSIS — E7849 Other hyperlipidemia: Secondary | ICD-10-CM

## 2016-12-20 DIAGNOSIS — E784 Other hyperlipidemia: Secondary | ICD-10-CM | POA: Diagnosis not present

## 2016-12-20 DIAGNOSIS — E039 Hypothyroidism, unspecified: Secondary | ICD-10-CM

## 2016-12-20 DIAGNOSIS — F411 Generalized anxiety disorder: Secondary | ICD-10-CM | POA: Diagnosis not present

## 2016-12-20 DIAGNOSIS — F439 Reaction to severe stress, unspecified: Secondary | ICD-10-CM

## 2016-12-20 LAB — POCT URINALYSIS DIPSTICK
BILIRUBIN UA: NEGATIVE
Glucose, UA: NEGATIVE
Ketones, UA: NEGATIVE
LEUKOCYTES UA: NEGATIVE
NITRITE UA: NEGATIVE
PH UA: 7
PROTEIN UA: NEGATIVE
RBC UA: NEGATIVE
Spec Grav, UA: 1.01
UROBILINOGEN UA: NEGATIVE

## 2016-12-20 MED ORDER — SIMVASTATIN 20 MG PO TABS: 20.0000 mg | ORAL_TABLET | Freq: Every day | ORAL | 3 refills | Status: DC

## 2016-12-20 MED ORDER — LEVOTHYROXINE SODIUM 50 MCG PO TABS: 50.0000 ug | ORAL_TABLET | Freq: Every day | ORAL | 0 refills | Status: DC

## 2016-12-20 MED ORDER — ALPRAZOLAM 0.5 MG PO TABS: 0.5000 mg | ORAL_TABLET | Freq: Two times a day (BID) | ORAL | 0 refills | Status: DC | PRN

## 2016-12-20 MED ORDER — VERAPAMIL HCL ER 240 MG PO TBCR: 240.0000 mg | EXTENDED_RELEASE_TABLET | Freq: Every day | ORAL | 3 refills | Status: DC

## 2016-12-20 MED ORDER — CYCLOBENZAPRINE HCL 10 MG PO TABS: 10.0000 mg | ORAL_TABLET | Freq: Two times a day (BID) | ORAL | 1 refills | Status: DC | PRN

## 2016-12-23 ENCOUNTER — Ambulatory Visit: Payer: PPO

## 2016-12-23 ENCOUNTER — Ambulatory Visit
Admission: RE | Admit: 2016-12-23 | Discharge: 2016-12-23 | Disposition: A | Discharge status: Home / Self Care | Payer: PPO | Source: Ambulatory Visit | Attending: Hematology | Admitting: Hematology

## 2016-12-23 DIAGNOSIS — Z1231 Encounter for screening mammogram for malignant neoplasm of breast: Secondary | ICD-10-CM

## 2016-12-28 NOTE — Progress Notes (Signed)
Subjective:    Patient ID: Tara Rodgers, female    DOB: May 30, 1946, 71 y.o.   MRN: [id number]  HPI 71 year old Female in today for health maintenance exam and evaluation of medical issues.  Currently has some situational stress. Her daughter has barred money from her for car and is in need of more money at present time. Patient is to liberating about whether to give daughter more money. Daughter is married. This is an ongoing issue for some time. 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  Past medical history: Right carpal tunnel syndrome 1995, surgery for left carpal tunnel syndrome 2010, bilateral tubal ligation 1986, left tennis elbow release 1990. Foot and ankle surgery 1999. Right breast lumpectomy with axillary node dissection 1996. 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 She underwent 6 months of chemotherapy with CMF and adjuvant radiation. She took estrogen replacement in the early 1990s. Suprapubic sling March 2008. History of perennial allergic rhinitis and vasomotor rhinitis. Is to be followed by Dr. Donneta Romberg for allergies in 2011. In 2001 skin test were positive for black walnut, dog, cat, house dust, dust mites, cockroach and mold as well as weeds.  Remote history of PVCs in 1991 treated with verapamil which she also takes for hypertension.  Never had colonoscopy. Had sigmoidoscopy done in 1998 by Dr. Teena Irani.  Social history: Does not smoke or consume alcohol except occasionally. 2 adult children, a son and a daughter. Has been married twice. Present husband is Dealer and has heart issues. He also has history of alcohol abuse. This is been stressful. She is retired from [employer].  Family history: Father died in 3817 of complications of  COPD at age 34 with history of coronary artery disease. One half sister with thyroid issues.  In June 2016 she underwent right mastectomy with reconstruction surgery for  invasive ductal carcinoma right breast. This was a 2.5 x 2.4 x 2.4 cm mass superior right breast. Tumor was ER positive PR positive HER-2/neu negative She's being followed by oncologist and has been doing well. This tumor was classified as stage IIA. She currently is on Femara 2.5 mg daily for 5 years at least.    Review of Systems fatigue and situational stress     Objective:   Physical Exam  Constitutional: She is oriented to person, place, and time. She appears well-developed and well-nourished.  HENT:  Head: Normocephalic and atraumatic.  Right Ear: External ear normal.  Left Ear: External ear normal.  Mouth/Throat: Oropharynx is clear and moist. No oropharyngeal exudate.  Eyes: Conjunctivae and EOM are normal. Pupils are equal, round, and reactive to light. Left eye exhibits no discharge. No scleral icterus.  Neck: Neck supple. No JVD present. No thyromegaly present.  Cardiovascular: Normal rate, regular rhythm and normal heart sounds.   No murmur heard. Pulmonary/Chest: Effort normal and breath sounds normal. No respiratory distress. She has no wheezes. She has no rales.  Reconstruction right breast. No palpable masses either breast  Abdominal: Soft. Bowel sounds are normal. She exhibits no distension and no mass. There is no tenderness. There is no rebound and no guarding.  Genitourinary:  Genitourinary Comments: Bimanual normal  Musculoskeletal: She exhibits no edema.  Lymphadenopathy:    She has no cervical adenopathy.  Neurological: She is alert and oriented to person, place, and  time. She has normal reflexes. No cranial nerve deficit. Coordination normal.  Skin: Skin is warm and dry. No rash noted. She is not diaphoretic.  Psychiatric: She has a normal mood and affect. Her behavior is normal. Judgment and  thought content normal.  Anxious  Vitals reviewed.         Assessment & Plan:  History of right breast cancer times 12/02/1994 and 2016 followed by oncologist  Situational stress with family  Essential hypertension  Anxiety depression  Allergic rhinitis  Hypothyroidism  Hyperlipidemia  GE reflux  Osteoporosis  Plan: Continue same medications and return in 6 months. Patient has declined colonoscopy.  Subjective:   Patient presents for Medicare Annual/Subsequent preventive examination.  Review Past Medical/Family/Social:See above   Risk Factors  Current exercise habits: Not a lot of exercise Dietary issues discussed: Low fat low carbohydrate  Cardiac risk factors: Hypertension, hyperlipidemia, family history in father  Depression Screen  (Note: if answer to either of the following is "Yes", a more complete depression screening is indicated)   Over the past two weeks, have you felt down, depressed or hopeless? Yes Over the past two weeks, have you felt little interest or pleasure in doing things? Yes Have you lost interest or pleasure in daily life? No Do you often feel hopeless? Sometimes Do you cry easily over simple problems? Sometimes  Activities of Daily Living  In your present state of health, do you have any difficulty performing the following activities?:   Driving? no Managing money? No  Feeding yourself? No  Getting from bed to chair? No  Climbing a flight of stairs? No  Preparing food and eating?: No  Bathing or showering? No  Getting dressed: No  Getting to the toilet? No  Using the toilet:No  Moving around from place to place: No  In the past year have you fallen or had a near fall?:No  Are you sexually active? No  Do you have more than one partner? No   Hearing Difficulties: No  Do you often ask people to speak up or repeat themselves? Sometimes Do you experience ringing or noises in your ears? No  Do you have difficulty understanding  soft or whispered voices? Sometimes Do you feel that you have a problem with memory? No Do you often misplace items? No    Home Safety:  Do you have a smoke alarm at your residence? Yes Do you have grab bars in the bathroom? No Do you have throw rugs in your house? No   Cognitive Testing  Alert? Yes Normal Appearance?Yes  Oriented to person? Yes Place? Yes  Time? Yes  Recall of three objects? Yes  Can perform simple calculations? Yes  Displays appropriate judgment?Yes  Can read the correct time from a watch face?Yes   List the Names of Other Physician/Practitioners you currently use:  See referral list for the physicians patient is currently seeing.     Review of Systems: See above   Objective:     General appearance: Appears stated age Head: Normocephalic, without obvious abnormality, atraumatic  Eyes: conj clear, EOMi PEERLA  Ears: normal TM's and external ear canals both ears  Nose: Nares normal. Septum midline. Mucosa normal. No drainage or sinus tenderness.  Throat: lips, mucosa, and tongue normal; teeth and gums normal  Neck: no adenopathy, no carotid bruit, no JVD, supple, symmetrical, trachea midline and thyroid not enlarged, symmetric, no tenderness/mass/nodules  No CVA tenderness.  Lungs: clear to auscultation bilaterally  Breasts: Without palpable masses  Heart: regular rate and rhythm, S1, S2 normal, no murmur, click, rub or gallop  Abdomen: soft, non-tender; bowel sounds normal; no masses, no organomegaly  Musculoskeletal: ROM normal in all joints, no crepitus, no deformity, Normal muscle strengthen. Back  is symmetric, no curvature. Skin: Skin color, texture, turgor normal. No rashes or lesions  Lymph nodes: Cervical, supraclavicular, and axillary nodes normal.  Neurologic: CN 2 -12 Normal, Normal symmetric reflexes. Normal coordination and gait  Psych: Alert & Oriented x 3, Mood appear stable.    Assessment:    Annual wellness medicare exam   Plan:     During the course of the visit the patient was educated and counseled about appropriate screening and preventive services including:  Annual flu vaccine  Annual mammogram  Has declined colonoscopy      Patient Instructions (the written plan) was given to the patient.  Medicare Attestation  I have personally reviewed:  The patient's medical and social history  Their use of alcohol, tobacco or illicit drugs  Their current medications and supplements  The patient's functional ability including ADLs,fall risks, home safety risks, cognitive, and hearing and visual impairment  Diet and physical activities  Evidence for depression or mood disorders  The patient's weight, height, BMI, and visual acuity have been recorded in the chart. I have made referrals, counseling, and provided education to the patient based on review of the above and I have provided the patient with a written personalized care plan for preventive services.

## 2016-12-28 NOTE — Patient Instructions (Signed)
Discussed situational stress at length. Continue same medications and return in 6 months.

## 2016-12-30 DIAGNOSIS — H2513 Age-related nuclear cataract, bilateral: Secondary | ICD-10-CM | POA: Diagnosis not present

## 2017-01-30 DIAGNOSIS — M25561 Pain in right knee: Secondary | ICD-10-CM | POA: Diagnosis not present

## 2017-01-30 DIAGNOSIS — M170 Bilateral primary osteoarthritis of knee: Secondary | ICD-10-CM | POA: Diagnosis not present

## 2017-01-30 DIAGNOSIS — M25562 Pain in left knee: Secondary | ICD-10-CM | POA: Diagnosis not present

## 2017-02-12 DIAGNOSIS — M25561 Pain in right knee: Secondary | ICD-10-CM | POA: Diagnosis not present

## 2017-02-12 DIAGNOSIS — M1711 Unilateral primary osteoarthritis, right knee: Secondary | ICD-10-CM | POA: Diagnosis not present

## 2017-02-12 DIAGNOSIS — M170 Bilateral primary osteoarthritis of knee: Secondary | ICD-10-CM | POA: Diagnosis not present

## 2017-02-20 DIAGNOSIS — M1711 Unilateral primary osteoarthritis, right knee: Secondary | ICD-10-CM | POA: Diagnosis not present

## 2017-02-20 DIAGNOSIS — M25561 Pain in right knee: Secondary | ICD-10-CM | POA: Diagnosis not present

## 2017-02-27 DIAGNOSIS — M25561 Pain in right knee: Secondary | ICD-10-CM | POA: Diagnosis not present

## 2017-02-27 DIAGNOSIS — M1711 Unilateral primary osteoarthritis, right knee: Secondary | ICD-10-CM | POA: Diagnosis not present

## 2017-03-06 DIAGNOSIS — M1711 Unilateral primary osteoarthritis, right knee: Secondary | ICD-10-CM | POA: Diagnosis not present

## 2017-03-06 DIAGNOSIS — M25561 Pain in right knee: Secondary | ICD-10-CM | POA: Diagnosis not present

## 2017-03-13 DIAGNOSIS — M25561 Pain in right knee: Secondary | ICD-10-CM | POA: Diagnosis not present

## 2017-03-13 DIAGNOSIS — M1711 Unilateral primary osteoarthritis, right knee: Secondary | ICD-10-CM | POA: Diagnosis not present

## 2017-03-17 ENCOUNTER — Other Ambulatory Visit: Payer: PPO | Admitting: Internal Medicine

## 2017-03-17 DIAGNOSIS — E039 Hypothyroidism, unspecified: Secondary | ICD-10-CM | POA: Diagnosis not present

## 2017-03-17 LAB — TSH: TSH: 9.58 m[IU]/L — AB

## 2017-03-21 ENCOUNTER — Encounter: Payer: Self-pay | Admitting: Internal Medicine

## 2017-03-21 ENCOUNTER — Ambulatory Visit (INDEPENDENT_AMBULATORY_CARE_PROVIDER_SITE_OTHER): Payer: PPO | Admitting: Internal Medicine

## 2017-03-21 VITALS — BP 118/68 | HR 95 | Temp 97.8°F | Wt 123.0 lb

## 2017-03-21 DIAGNOSIS — R7989 Other specified abnormal findings of blood chemistry: Secondary | ICD-10-CM

## 2017-03-21 DIAGNOSIS — E039 Hypothyroidism, unspecified: Secondary | ICD-10-CM

## 2017-03-21 DIAGNOSIS — I10 Essential (primary) hypertension: Secondary | ICD-10-CM | POA: Diagnosis not present

## 2017-03-21 DIAGNOSIS — R946 Abnormal results of thyroid function studies: Secondary | ICD-10-CM

## 2017-03-21 MED ORDER — LEVOTHYROXINE SODIUM 75 MCG PO TABS: 75.0000 ug | ORAL_TABLET | Freq: Every day | ORAL | 0 refills | Status: DC

## 2017-03-21 NOTE — Progress Notes (Signed)
   Subjective:    Patient ID: Tara Rodgers, female    DOB: 1946/05/27, 71 y.o.   MRN: [id number]  HPI  For follow up of hypothyroidism. At last visit, pt had abnormal TSH of 0.14. In September 2017, TSH was low at 0.27.  Pt felt fine each visit,No thyromegaly. VS reviewed but I decided to lower thyroid replacement therapy to 0.05mg  daily.. Now TSH is high at 9.58 which seems odd with such a small change. It seems she has been taking thyroid replacement with other meds and not on empty stomach. It is plausible pharmacy changed brands.Subsequently was changed back to 0.o75 mg daily and advised to take on empty stomach with no other meds.    Review of Systems No  new complaints      Objective:   Physical Exam Vital signs reviewed. No thyromegaly.   .    Assessment & Plan:  Elevated TSH Hypothyroidism BP stable at 118/68- hx of essential HTN Plan: Change back to 0.075 mg daily and f/u at end of July.

## 2017-03-21 NOTE — Patient Instructions (Addendum)
Change back to Levothyroxine 0.075 mg .RTC end of July.

## 2017-03-26 DIAGNOSIS — R262 Difficulty in walking, not elsewhere classified: Secondary | ICD-10-CM | POA: Diagnosis not present

## 2017-03-26 DIAGNOSIS — M1711 Unilateral primary osteoarthritis, right knee: Secondary | ICD-10-CM | POA: Diagnosis not present

## 2017-03-26 DIAGNOSIS — M25561 Pain in right knee: Secondary | ICD-10-CM | POA: Diagnosis not present

## 2017-04-15 NOTE — Progress Notes (Signed)
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) and adjuvant radiation ([name]).      12/13/2014 Mammogram    Right breast: possible asymmetry warranting further evaluation with spot compression views and possibly ultrasound      12/16/2014 Breast US    Right breast: no definitive abnormality within the superior aspect of the right breast to correspond with the mammographic finding.      01/10/2015 Breast MRI    Right breast: irregular rim enhancing mass within the superior right breast at 11:30 measuring 2.5 x 2.4 x 2.4 cm. No abnormal enhancement is identified within the skin of the superior right breast in the area of concern on mammogram.      01/12/2015 Breast US    Second look diagnostic mammogram and ultrasound showed a 2.3 cm mass in the upper midline right breast. No axillary adenopathy.      01/12/2015 Initial Biopsy    Right breast needle core bx (upper midline): Invasive ductal carcinoma, grade 2, ER+ (100%), PR+ (77%), HER2/neu negative (ratio 1.15), Ki67 20%        01/30/2015 Procedure    Breast High/Moderate Risk panel ([facility]) reveals no clinically significant variant at ATM, BRCA1, BRCA2, CDH1, CHEK2, PALB2, PTEN, STK11, and TP53.      02/08/2015 Clinical Stage    Stage IIA (T2 N0)      03/23/2015 Definitive Surgery    Right mastectomy ([name]): invasive adenocarcinoma, grade 3, 2.9 cm, HER2/neu repeated, negative (ratio 1.23)      03/23/2015 Oncotype testing    Score: 8 (6% ROR). No chemotherapy [name]).      03/23/2015 Pathologic Stage    Stage IIA: pT2 pNx      05/04/2015 -  Anti-estrogen oral therapy    Letrozole 2.64m daily. Planned duration of therapy at least 5 years.       07/25/2015 Survivorship    Survivorship visit completed and copy of care plan provided to patient.      12/15/2015 Mammogram    IMPRESSION: No mammographic evidence of malignancy. A result letter of this screening mammogram will be mailed directly to the patient.      12/23/2016 Mammogram    IMPRESSION: No mammographic evidence of malignancy. A result letter of this screening mammogram will be mailed directly to the patient.        HISTORY OF PRESENTING ILLNESS:  Tara PORCARO71y.o. female is here because of recently diagnosed right breast cancer.  She had right breast cancer in 1996, which was treated with lumpectomy and axillary lymph node dissection, followed by adjuvant chemotherapy  and radiation.   She did not have (/need) adjuvant endocrine therapy.  She has been followed with annual mammograms since then.  Her routine screening mammograms on 12/13/2014 showed a possible asymmetrically in the right breast. She underwent diagnostic mammogram and ultrasound on 12/16/2014 which showed a suspicious irregular subcutaneous density within the right breast, no definitive sonographic correlation was identified. She subsequently underwent breast MRI on 01/11/2015, which showed a rim enhancing mass within the right breast at 11:30 position measuring 2.5  x 2.4 x 2.4 cm. No other lesion or adenopathy. She underwent core biopsy on 01/12/2015, which showed invasive ductal carcinoma, ER positive, PR positive, HER-2 negative, Ki-67 20%.  She feels well, did not feel any palpable mass before the screening mammogram. She denies any pain, dyspnea, fatigue or any other symptoms.   CURRENT THERAPY:   Letrozole 2.5 mg once daily, started on 05/04/2015.  INTERIM HISTORY Dejae returns for follow up. She is doing well overall. She is tolerating letrozole very well, no significant side effects. She has been doing well. Only problem she has is right knee pain. She also has a little arthritis in fingers. She walks her 2 puppies everyday. She has no other concerns. She denies any numbness or tingling in her breast area.   MEDICAL HISTORY:  Past Medical History:  Diagnosis Date  . Anxiety   . Asthma    does not take any medications, hx of inhaler use  . Breast cancer [facility]) 1995/2016   right sided breast cancer in 08/1994  . Bronchitis    hx of  . GERD (gastroesophageal reflux disease)   . History of hiatal hernia   . Hypertension   . Hyperthyroidism     SURGICAL HISTORY: Past Surgical History:  Procedure Laterality Date  . Carpel Tunnel  Bilateral   . INCONTINENCE SURGERY     with mesh insertion about 7 years ago  . LATISSIMUS FLAP TO BREAST Right 03/23/2015   Procedure: LATISSIMUS FLAP TO BREAST WITH PLACEMENT OF IMPLANT FOR BREAST RECONSTRUCTION;  Surgeon: [name], MD;  Location: [place];  Service: Plastics;  Laterality: Right;  . MASTECTOMY Right 03/23/2015  . RECONSTRUCTION BREAST W/ LATISSIMUS DORSI FLAP Right 03/23/2015  . TONSILLECTOMY     as a child  . TOTAL MASTECTOMY Right 03/23/2015   Procedure: RIGHT TOTAL MASTECTOMY;  Surgeon: [name], MD;  Location: [place];  Service: General;  Laterality: Right;    SOCIAL HISTORY: Social History   Social History  . Marital status: Married    Spouse name: N/A  . Number of children: 2   . Years of education: N/A   Occupational History  . Not on file.   Social History Main Topics  . Smoking status: Never Smoker  . Smokeless tobacco: Never Used  . Alcohol use Yes     Comment: occasional  . Drug use: No  . Sexual activity: Not on file   Other Topics Concern  . Not on file   Social History Narrative  . No narrative on file   GYN HISTORY  Menarchal: 71 yo  LMP: 1990 Contraceptive:no    HRT: one year in [place]: Family History  Problem Relation Age of Onset  . COPD Father   . Heart disease Father     ALLERGIES:  is allergic to fosamax [alendronate sodium].  MEDICATIONS:  Current Outpatient Prescriptions  Medication Sig Dispense Refill  . ALPRAZolam (XANAX) 0.5 MG tablet Take 1 tablet (0.5 mg total) by  mouth 2 (two) times daily as needed. 180 tablet 0  . Calcium-Magnesium-Zinc 167-83-8 MG TABS Take 1 tablet by mouth daily.    . cyclobenzaprine (FLEXERIL) 10 MG tablet Take 1 tablet (10 mg total) by mouth 2 (two) times daily as needed. 180 tablet 1  . letrozole (FEMARA) 2.5 MG tablet Take 1 tablet (2.5 mg total) by mouth daily. 90 tablet 1  . levothyroxine (SYNTHROID, LEVOTHROID) 75 MCG tablet Take 1 tablet (75 mcg total) by mouth daily. 90 tablet 0  . pantoprazole (PROTONIX) 40 MG tablet TAKE ONE TABLET BY MOUTH ONCE DAILY 30 tablet 5  . simvastatin (ZOCOR) 20 MG tablet Take 1 tablet (20 mg total) by mouth at bedtime. 90 tablet 3  . verapamil (CALAN-SR) 240 MG CR tablet Take 1 tablet (240 mg total) by mouth daily. 90 tablet 3   No current facility-administered medications for this visit.     REVIEW OF SYSTEMS:   Constitutional: Denies fevers, chills or abnormal night sweats Eyes: Denies blurriness of vision, double vision or watery eyes Ears, nose, mouth, throat, and face: Denies mucositis or sore throat Respiratory: Denies cough, dyspnea or wheezes Cardiovascular: Denies palpitation, chest discomfort or lower extremity  swelling Gastrointestinal:  Denies nausea, heartburn or change in bowel habits Skin: Denies abnormal skin rashes Lymphatics: Denies new lymphadenopathy or easy bruising Neurological:Denies numbness, tingling or new weaknesses Behavioral/Psych: Mood is stable, no new changes  All other systems were reviewed with the patient and are negative.  PHYSICAL EXAMINATION:  ECOG PERFORMANCE STATUS: 0 - Asymptomatic  Vitals:   04/17/17 1258  BP: 116/63  Pulse: (!) 115  Resp: 20  Temp: 98 F (36.7 C)   Filed Weights   04/17/17 1258  Weight: 123 lb 12.8 oz (56.2 kg)    GENERAL:alert, no distress and comfortable SKIN: skin color, texture, turgor are normal, no rashes or significant lesions EYES: normal, conjunctiva are pink and non-injected, sclera clear OROPHARYNX:no exudate, no erythema and lips, buccal mucosa, and tongue normal  NECK: supple, thyroid normal size, non-tender, without nodularity LYMPH:  no palpable lymphadenopathy in the cervical, axillary or inguinal LUNGS: clear to auscultation and percussion with normal breathing effort HEART: regular rate & rhythm and no murmurs and no lower extremity edema ABDOMEN:abdomen soft, non-tender and normal bowel sounds Musculoskeletal:no cyanosis of digits and no clubbing  PSYCH: alert & oriented x 3 with fluent speech NEURO: no focal motor/sensory deficits Breasts: Status post right simple mastectomy and reconstruction. Surgical scar is healing well. Palpation of the left breasts and axilla revealed no obvious mass that I could appreciate.   LABORATORY DATA:  I have reviewed the data as listed CBC Latest Ref Rng & Units 04/17/2017 12/17/2016 10/17/2016  WBC 3.9 - 10.3 10e3/uL 6.2 6.3 8.2  Hemoglobin 11.6 - 15.9 g/dL 12.2 12.5 13.1  Hematocrit 34.8 - 46.6 % 36.8 37.8 39.8  Platelets 145 - 400 10e3/uL 241 239 254    CMP Latest Ref Rng & Units 04/17/2017 12/17/2016 10/17/2016  Glucose 70 - 140 mg/dl 124 92 74  BUN 7.0 - 26.0 mg/dL 15.2 20 20.1   Creatinine 0.6 - 1.1 mg/dL 1.1 1.01(H) 1.1  Sodium 136 - 145 mEq/L 142 142 141  Potassium 3.5 - 5.1 mEq/L 4.2 4.9 4.5  Chloride 98 - 110 mmol/L - 109 -  CO2 22 - 29 mEq/L '24 24 22  ' Calcium 8.4 - 10.4 mg/dL 8.9 8.9 9.6  Total Protein 6.4 - 8.3 g/dL 7.0 6.4 7.4  Total Bilirubin 0.20 - 1.20  mg/dL 0.46 0.4 0.52  Alkaline Phos 40 - 150 U/L 59 58 69  AST 5 - 34 U/L '15 14 15  ' ALT 0 - 55 U/L '17 13 16     ' PATHOLOGY REPORT" Diagnosis 03/23/2015 Breast, simple mastectomy, right - INVASIVE ADENOCARCINOMA, SEE COMMENT. - PREVIOUS BIOPSY SITE PRESENT. - SEE TUMOR SYNOPTIC TEMPLATE BELOW. Microscopic Comment BREAST, INVASIVE TUMOR, WITHOUT LYMPH NODES PRESENT Specimen, including laterality: Right breast without lymph node sampling. Procedure: Simple mastectomy. Histologic type: Ductal. Grade: 3 of 3. Tubule formation: 3. Nuclear pleomorphism: 3. Mitotic: 2. Tumor size (gross measurement): 2.9 cm. Invasive, distance to closest margin: 0.5 cm (posterior). In-situ, distance to closest margin: N/A. If margin positive, focally or broadly: N/A. Lymphovascular invasion: Absent. Ductal carcinoma in situ: Absent. Grade: N/A. Extensive intraductal component: N/A. Lobular neoplasia: Absent. Tumor focality: Unifocal. Treatment effect: None. If present, treatment effect in breast tissue, lymph nodes or both: N/A. Extent of tumor: Skin: N/A. Nipple: N/A. Skeletal muscle: Negative for tumor.  Estrogen receptor: Not repeated, previous study demonstrated 100% positivity ([id number]). Progesterone receptor: Not repeated, previous study demonstrated 77% proliferation rate ([id number]). Her 2 neu: Repeated, previous study demonstrated no amplification (1.90) ([id number]). Ki-67: Not repeated, previous study demonstrated 20% proliferation rate ([id number]). Non-neoplastic breast: Previous biopsy site, fibrocytic change, coarse vascular calcifications. TNM: pT2, pNX, pMX.  ONCOTYPE RS  8  RADIOGRAPHIC STUDIES: I have personally reviewed the radiological images as listed and agreed with the findings in the report.   Mammogram 12/23/2016 IMPRESSION: No mammographic evidence of malignancy. A result letter of this screening mammogram will be mailed directly to the patient.  MM screening tomo Left 12/15/2015  IMPRESSION: No mammographic evidence of malignancy. A result letter of this screening mammogram will be mailed directly to the patient.  RECOMMENDATION: Screening mammogram of the left breast in one year.  BI-RADS CATEGORY 1: Negative.  ASSESSMENT & PLAN:  71 y.o. female, with remote history of right breast cancer in 1996, status post lumpectomy with lymph node dissection, adjuvant chemotherapy and irradiation. She was found to have a right breast mass by screening mammogram.  1. Recurrent right breast invasive ductal carcinoma, pT2N0, stage II, grade 3, ER positive, PR positive, HER-2 negative -she  Is clinically doing well, physical exam and last mammogram was negative for recurrence. - she has been tolerating letrozole very well, we'll continue, for total of at least 5-10 years. - she'll continue surveillance with annual mammogram, routine physical exam and lab. -I previously  encouraged her to continue healthy diet and exercise, and watch her weight.  - Labs reviewed, within normal limits. Her physical exam was unremarkable. She is clinically doing well. No evidence of recurrence - Will continue to monitor every 6 months while she is on Letrozole. I will refill today  2. Osteoporosis -She has known history of osteoporosis.  Last bone density scan on 07/12/2016 showed improvement into the osteopenia rnage - she is getting Prolia at her PCP's office  Every 6 months. -continue calcium 1 g, vitamin D at least 1000 units daily, and weight-bearing exercise distress or bone. - will check vitamin D yearly  3.  Arthritis - she will follow-up with her orthopedic  surgeon for the right knee arthritis - we previoiusly discussed letrozole can cause muscular and joint discomfort.  Her arthralgia has been stable since she started letrozole.   PLAN - continue letrozole. Refill today - return to clinic in 6 months for follow-up with lab.   All questions were answered. The patient knows  to call the clinic with any problems, questions or concerns. I spent 15 minutes counseling the patient face to face. The total time spent in the appointment was 20 minutes and more than 50% was on counseling.  This document serves as a record of services personally performed by [name], MD. It was created on her behalf by Brandt Loosen, a trained medical scribe. The creation of this record is based on the scribe's personal observations and the provider's statements to them. This document has been checked and approved by the attending provider.     [name], MD 04/17/2017

## 2017-04-17 ENCOUNTER — Other Ambulatory Visit (HOSPITAL_BASED_OUTPATIENT_CLINIC_OR_DEPARTMENT_OTHER): Payer: PPO

## 2017-04-17 ENCOUNTER — Ambulatory Visit (HOSPITAL_BASED_OUTPATIENT_CLINIC_OR_DEPARTMENT_OTHER): Payer: PPO | Admitting: Hematology

## 2017-04-17 ENCOUNTER — Encounter: Payer: Self-pay | Admitting: Hematology

## 2017-04-17 ENCOUNTER — Telehealth: Payer: Self-pay | Admitting: Hematology

## 2017-04-17 VITALS — BP 116/63 | HR 115 | Temp 98.0°F | Resp 20 | Ht <= 58 in | Wt 123.8 lb

## 2017-04-17 DIAGNOSIS — I10 Essential (primary) hypertension: Secondary | ICD-10-CM

## 2017-04-17 DIAGNOSIS — M810 Age-related osteoporosis without current pathological fracture: Secondary | ICD-10-CM | POA: Diagnosis not present

## 2017-04-17 DIAGNOSIS — M1711 Unilateral primary osteoarthritis, right knee: Secondary | ICD-10-CM

## 2017-04-17 DIAGNOSIS — C50911 Malignant neoplasm of unspecified site of right female breast: Secondary | ICD-10-CM

## 2017-04-17 DIAGNOSIS — C50411 Malignant neoplasm of upper-outer quadrant of right female breast: Secondary | ICD-10-CM | POA: Diagnosis not present

## 2017-04-17 LAB — COMPREHENSIVE METABOLIC PANEL
ALBUMIN: 3.8 g/dL (ref 3.5–5.0)
ALK PHOS: 59 U/L (ref 40–150)
ALT: 17 U/L (ref 0–55)
AST: 15 U/L (ref 5–34)
Anion Gap: 10 mEq/L (ref 3–11)
BUN: 15.2 mg/dL (ref 7.0–26.0)
CO2: 24 mEq/L (ref 22–29)
Calcium: 8.9 mg/dL (ref 8.4–10.4)
Chloride: 108 mEq/L (ref 98–109)
Creatinine: 1.1 mg/dL (ref 0.6–1.1)
EGFR: 52 mL/min/{1.73_m2} — ABNORMAL LOW (ref >90)
GLUCOSE: 124 mg/dL (ref 70–140)
POTASSIUM: 4.2 meq/L (ref 3.5–5.1)
SODIUM: 142 meq/L (ref 136–145)
TOTAL PROTEIN: 7 g/dL (ref 6.4–8.3)
Total Bilirubin: 0.46 mg/dL (ref 0.20–1.20)

## 2017-04-17 LAB — CBC WITH DIFFERENTIAL/PLATELET
BASO%: 0.5 % (ref 0.0–2.0)
Basophils Absolute: 0 10*3/uL (ref 0.0–0.1)
EOS ABS: 0.1 10*3/uL (ref 0.0–0.5)
EOS%: 1.9 % (ref 0.0–7.0)
HCT: 36.8 % (ref 34.8–46.6)
HEMOGLOBIN: 12.2 g/dL (ref 11.6–15.9)
LYMPH%: 27.8 % (ref 14.0–49.7)
MCH: 29.3 pg (ref 25.1–34.0)
MCHC: 33.2 g/dL (ref 31.5–36.0)
MCV: 88.5 fL (ref 79.5–101.0)
MONO#: 0.4 10*3/uL (ref 0.1–0.9)
MONO%: 6.3 % (ref 0.0–14.0)
NEUT%: 63.5 % (ref 38.4–76.8)
NEUTROS ABS: 4 10*3/uL (ref 1.5–6.5)
Platelets: 241 10*3/uL (ref 145–400)
RBC: 4.16 10*6/uL (ref 3.70–5.45)
RDW: 14.2 % (ref 11.2–14.5)
WBC: 6.2 10*3/uL (ref 3.9–10.3)
lymph#: 1.7 10*3/uL (ref 0.9–3.3)

## 2017-04-17 MED ORDER — LETROZOLE 2.5 MG PO TABS: 2.5000 mg | ORAL_TABLET | Freq: Every day | ORAL | 1 refills | Status: DC

## 2017-04-17 NOTE — Telephone Encounter (Signed)
Scheduled appt per 7/5 los - Gave patient AVS and calender per los.

## 2017-05-05 ENCOUNTER — Other Ambulatory Visit: Payer: Self-pay | Admitting: Internal Medicine

## 2017-05-13 ENCOUNTER — Other Ambulatory Visit: Payer: PPO | Admitting: Internal Medicine

## 2017-05-13 DIAGNOSIS — E039 Hypothyroidism, unspecified: Secondary | ICD-10-CM

## 2017-05-13 LAB — TSH: TSH: 0.12 mIU/L — ABNORMAL LOW

## 2017-05-19 ENCOUNTER — Encounter: Payer: Self-pay | Admitting: Internal Medicine

## 2017-05-19 ENCOUNTER — Ambulatory Visit (INDEPENDENT_AMBULATORY_CARE_PROVIDER_SITE_OTHER): Payer: PPO | Admitting: Internal Medicine

## 2017-05-19 VITALS — BP 108/68 | HR 122 | Temp 97.8°F | Wt 125.0 lb

## 2017-05-19 DIAGNOSIS — I10 Essential (primary) hypertension: Secondary | ICD-10-CM

## 2017-05-19 DIAGNOSIS — M1711 Unilateral primary osteoarthritis, right knee: Secondary | ICD-10-CM

## 2017-05-19 DIAGNOSIS — R300 Dysuria: Secondary | ICD-10-CM

## 2017-05-19 DIAGNOSIS — E039 Hypothyroidism, unspecified: Secondary | ICD-10-CM

## 2017-05-19 LAB — POCT URINALYSIS DIPSTICK
Bilirubin, UA: NEGATIVE
Glucose, UA: NEGATIVE
Ketones, UA: NEGATIVE
NITRITE UA: NEGATIVE
PROTEIN UA: NEGATIVE
RBC UA: NEGATIVE
SPEC GRAV UA: 1.01 (ref 1.010–1.025)
UROBILINOGEN UA: 0.2 U/dL
pH, UA: 6.5 (ref 5.0–8.0)

## 2017-05-19 MED ORDER — CIPROFLOXACIN HCL 250 MG PO TABS: 250.0000 mg | ORAL_TABLET | Freq: Two times a day (BID) | ORAL | 0 refills | Status: DC

## 2017-05-19 NOTE — Patient Instructions (Signed)
To see orthopedist regarding right knee issues. Take levothyroxine same dose but do not take it on Sunday and follow-up in 6 weeks. Take Cipro 250 mg twice daily for 5 days for dysuria. Urine culture pending.

## 2017-05-19 NOTE — Progress Notes (Signed)
   Subjective:    Patient ID: Tara Rodgers, female    DOB: 1946-07-19, 71 y.o.   MRN: [id number]  HPI  71 year old Female for follow up of hypothyroidism. TSH is low and has been taking it correctly.At last visit it was elevated above 9. Her dose was increased to 0.075 mg daily from 0.05 mg daily at that time. Thinks [facility] changed brands on her recently. The color of the pill is different.  Went to [facility] and had right knee injected several times. Eventually had cortisone injection as well. She took to her husband's oral prednisone tabs 20 mg each- one half tab daily x 4 days and improved.  Has been having dysuria for the past week. Clean catch UA is abnormal and culture will be sent. No fever or shaking chills.  History of hypertension.      Review of Systems see above     Objective:   Physical Exam  No thyromegaly. See dipstick UA      Assessment & Plan:  TSH is low- Feels better on this dose. Less hand swelling. Will ask not to take on Sunday and recheck in 6 weeks with office visit.   Dysuria- take Cipro 250 mg bid x 5 days. Urine culture pending.   Osteoarthritis right knee-To see orthopedist in the near future.  25 minutes spent with patient including depression screen, evaluation of UTI symptoms, hypothyroidism and discussion regarding management of osteoarthritis of knee

## 2017-05-20 LAB — URINE CULTURE: Organism ID, Bacteria: NO GROWTH

## 2017-05-27 DIAGNOSIS — E559 Vitamin D deficiency, unspecified: Secondary | ICD-10-CM | POA: Diagnosis not present

## 2017-05-27 DIAGNOSIS — M810 Age-related osteoporosis without current pathological fracture: Secondary | ICD-10-CM | POA: Diagnosis not present

## 2017-05-27 DIAGNOSIS — R5383 Other fatigue: Secondary | ICD-10-CM | POA: Diagnosis not present

## 2017-06-04 DIAGNOSIS — E559 Vitamin D deficiency, unspecified: Secondary | ICD-10-CM | POA: Diagnosis not present

## 2017-06-04 DIAGNOSIS — M810 Age-related osteoporosis without current pathological fracture: Secondary | ICD-10-CM | POA: Diagnosis not present

## 2017-06-06 DIAGNOSIS — M1711 Unilateral primary osteoarthritis, right knee: Secondary | ICD-10-CM | POA: Diagnosis not present

## 2017-06-26 ENCOUNTER — Other Ambulatory Visit: Payer: PPO | Admitting: Internal Medicine

## 2017-06-26 DIAGNOSIS — E039 Hypothyroidism, unspecified: Secondary | ICD-10-CM

## 2017-06-26 LAB — EXTRA LAV TOP TUBE

## 2017-06-26 LAB — TSH: TSH: 0.6 mIU/L (ref 0.40–4.50)

## 2017-06-27 ENCOUNTER — Encounter: Payer: Self-pay | Admitting: Internal Medicine

## 2017-06-27 ENCOUNTER — Ambulatory Visit (INDEPENDENT_AMBULATORY_CARE_PROVIDER_SITE_OTHER): Payer: PPO | Admitting: Internal Medicine

## 2017-06-27 VITALS — BP 124/82 | HR 113 | Temp 99.3°F | Resp 20 | Wt 130.0 lb

## 2017-06-27 DIAGNOSIS — E039 Hypothyroidism, unspecified: Secondary | ICD-10-CM

## 2017-06-27 NOTE — Progress Notes (Signed)
   Subjective:    Patient ID: SILVA AAMODT, female    DOB: [birth year], 71 y.o.   MRN: [id number]  HPI 71 year old Female in today to follow-up on hypothyroidism. She was here in June and had an elevated TSH. We had lowered her thyroid replacement to 0.05 mg prior to that. In June TSH was 9.58. Apparently she was taking thyroid replacement medication with other medications and not on an empty stomach. We changed thyroid replacement back to 0.075 mg daily and advised her to take medication only and he stomach with no other medications. She is here today for follow-up. TSH is now normal at 0.60.    Review of Systems no new complaints- hoping to go on vacation to [place] in the near future     Objective:   Physical Exam  No thyromegaly. Neck supple without adenopathy. Chest clear. Cardiac exam regular rate and rhythm. Extremities without edema      Assessment & Plan:  Hypothyroidism  Plan: Continue levothyroxine 0.075 mg daily. Due for physical exam in March. Appointment made.

## 2017-07-03 ENCOUNTER — Other Ambulatory Visit: Payer: Self-pay | Admitting: Internal Medicine

## 2017-07-03 NOTE — Telephone Encounter (Signed)
Refill x 6 months 

## 2017-07-10 DIAGNOSIS — M65312 Trigger thumb, left thumb: Secondary | ICD-10-CM | POA: Diagnosis not present

## 2017-07-10 DIAGNOSIS — M1711 Unilateral primary osteoarthritis, right knee: Secondary | ICD-10-CM | POA: Diagnosis not present

## 2017-07-10 DIAGNOSIS — M65342 Trigger finger, left ring finger: Secondary | ICD-10-CM | POA: Diagnosis not present

## 2017-07-12 NOTE — Patient Instructions (Signed)
Continue levothyroxine 0.075 mg daily. Due for physical exam in March 2019.

## 2017-07-14 DIAGNOSIS — M1711 Unilateral primary osteoarthritis, right knee: Secondary | ICD-10-CM | POA: Diagnosis not present

## 2017-07-28 ENCOUNTER — Telehealth: Payer: Self-pay

## 2017-07-28 MED ORDER — CYCLOBENZAPRINE HCL 10 MG PO TABS: 10.0000 mg | ORAL_TABLET | Freq: Two times a day (BID) | ORAL | 3 refills | Status: DC | PRN

## 2017-07-28 NOTE — Telephone Encounter (Signed)
Received fax from [facility] in regards to a refill on Flexeril 10mg  for patient. Medication was refilled per Dr. Verlene Mayer request. Sent 1 yr

## 2017-08-07 DIAGNOSIS — M79645 Pain in left finger(s): Secondary | ICD-10-CM | POA: Diagnosis not present

## 2017-08-07 DIAGNOSIS — M65312 Trigger thumb, left thumb: Secondary | ICD-10-CM | POA: Diagnosis not present

## 2017-08-07 DIAGNOSIS — M65342 Trigger finger, left ring finger: Secondary | ICD-10-CM | POA: Diagnosis not present

## 2017-09-08 DIAGNOSIS — M1711 Unilateral primary osteoarthritis, right knee: Secondary | ICD-10-CM | POA: Diagnosis not present

## 2017-09-13 DIAGNOSIS — M1711 Unilateral primary osteoarthritis, right knee: Secondary | ICD-10-CM | POA: Diagnosis not present

## 2017-09-18 DIAGNOSIS — M1711 Unilateral primary osteoarthritis, right knee: Secondary | ICD-10-CM | POA: Diagnosis not present

## 2017-10-09 ENCOUNTER — Telehealth: Payer: Self-pay | Admitting: Hematology

## 2017-10-09 NOTE — Telephone Encounter (Signed)
Call day - moved 1/4 appointment to 1/8. Spoke with patient she is aware.

## 2017-10-17 ENCOUNTER — Other Ambulatory Visit: Payer: PPO

## 2017-10-17 ENCOUNTER — Ambulatory Visit: Payer: PPO | Admitting: Hematology

## 2017-10-20 NOTE — Progress Notes (Signed)
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) and adjuvant radiation ([name]).      12/13/2014 Mammogram    Right breast: possible asymmetry warranting further evaluation with spot compression views and possibly ultrasound      12/16/2014 Breast US    Right breast: no definitive abnormality within the superior aspect of the right breast to correspond with the mammographic finding.      01/10/2015 Breast MRI    Right breast: irregular rim enhancing mass within the superior right breast at 11:30 measuring 2.5 x 2.4 x 2.4 cm. No abnormal enhancement is identified within the skin of the superior right breast in the area of concern on mammogram.      01/12/2015 Breast US    Second look diagnostic mammogram and ultrasound showed a 2.3 cm mass in the upper midline right breast. No axillary adenopathy.      01/12/2015 Initial Biopsy    Right breast needle core bx (upper midline):  Invasive ductal carcinoma, grade 2, ER+ (100%), PR+ (77%), HER2/neu negative (ratio 1.15), Ki67 20%       01/30/2015 Procedure    Breast High/Moderate Risk panel ([facility]) reveals no clinically significant variant at ATM, BRCA1, BRCA2, CDH1, CHEK2, PALB2, PTEN, STK11, and TP53.      02/08/2015 Clinical Stage    Stage IIA (T2 N0)      03/23/2015 Definitive Surgery    Right mastectomy ([name]): invasive adenocarcinoma, grade 3, 2.9 cm, HER2/neu repeated, negative (ratio 1.23)      03/23/2015 Oncotype testing    Score: 8 (6% ROR). No chemotherapy [name]).      03/23/2015 Pathologic Stage    Stage IIA: pT2 pNx      05/04/2015 -  Anti-estrogen oral therapy    Letrozole 2.52m daily. Planned duration of therapy at least 5 years.       07/25/2015 Survivorship    Survivorship visit completed and copy of care plan provided to patient.      12/15/2015 Mammogram    IMPRESSION: No mammographic evidence of malignancy. A result letter of this screening mammogram will be mailed directly to the patient.      12/23/2016 Mammogram    IMPRESSION: No mammographic evidence of malignancy. A result letter of this screening mammogram will be mailed directly to the patient.        HISTORY OF PRESENTING ILLNESS:  Tara KELLETT89y.o. female is here because of recently diagnosed right breast cancer.  She had right breast cancer in  1996, which was treated with lumpectomy and axillary lymph node dissection, followed by adjuvant chemotherapy and radiation.   She did not have (/need) adjuvant endocrine therapy.  She has been followed with annual mammograms since then.  Her routine screening mammograms on 12/13/2014 showed a possible asymmetrically in the right breast. She underwent diagnostic mammogram and ultrasound on 12/16/2014 which showed a suspicious irregular subcutaneous density within the right breast, no definitive sonographic correlation was identified. She subsequently underwent breast MRI on  01/11/2015, which showed a rim enhancing mass within the right breast at 11:30 position measuring 2.5 x 2.4 x 2.4 cm. No other lesion or adenopathy. She underwent core biopsy on 01/12/2015, which showed invasive ductal carcinoma, ER positive, PR positive, HER-2 negative, Ki-67 20%.  She feels well, did not feel any palpable mass before the screening mammogram. She denies any pain, dyspnea, fatigue or any other symptoms.   CURRENT THERAPY:   Letrozole 2.5 mg once daily, started on 05/04/2015.  INTERIM HISTORY  Kira returns for follow up. She was last seen by me 6 months ago. She presents to the clinic today noting she is doing well overall. She notes no concerns from a cancer standpoint. She has been taking letrozole and tolerating it well. She notes left trigger finger in two of her digits. She tried cortisone but has not helped. She plans to get it treated by her hand surgeon. Her right knee has a torn meniscus, which has given her pain for the past year. Her orthopedic surgeon Dr. Stann Mainland will give her three rounds of Euflexxa starting 1/14. She is ambulating without a cane. She will get a tingling and burning in her knee occasionally.      MEDICAL HISTORY:  Past Medical History:  Diagnosis Date  . Anxiety   . Asthma    does not take any medications, hx of inhaler use  . Breast cancer [facility]) 1995/2016   right sided breast cancer in 08/1994  . Bronchitis    hx of  . GERD (gastroesophageal reflux disease)   . History of hiatal hernia   . Hypertension   . Hyperthyroidism     SURGICAL HISTORY: Past Surgical History:  Procedure Laterality Date  . Carpel Tunnel  Bilateral   . INCONTINENCE SURGERY     with mesh insertion about 7 years ago  . LATISSIMUS FLAP TO BREAST Right 03/23/2015   Procedure: LATISSIMUS FLAP TO BREAST WITH PLACEMENT OF IMPLANT FOR BREAST RECONSTRUCTION;  Surgeon: [name], MD;  Location: [place];  Service: Plastics;  Laterality: Right;  . MASTECTOMY Right 03/23/2015    . RECONSTRUCTION BREAST W/ LATISSIMUS DORSI FLAP Right 03/23/2015  . TONSILLECTOMY     as a child  . TOTAL MASTECTOMY Right 03/23/2015   Procedure: RIGHT TOTAL MASTECTOMY;  Surgeon: [name], MD;  Location: [place];  Service: General;  Laterality: Right;    SOCIAL HISTORY: Social History   Socioeconomic History  . Marital status: Married    Spouse name: Not on file  . Number of children: 2  . Years of education: Not on file  . Highest education level: Not on file  Social Needs  . Financial resource strain: Not on file  . Food insecurity - worry: Not on file  . Food insecurity - inability: Not on file  . Transportation needs - medical: Not on file  . Transportation needs - non-medical: Not on file  Occupational History  . Not on file  Tobacco Use  . Smoking status: Never Smoker  .  Smokeless tobacco: Never Used  Substance and Sexual Activity  . Alcohol use: Yes    Comment: occasional  . Drug use: No  . Sexual activity: Not on file  Other Topics Concern  . Not on file  Social History Narrative  . Not on file   GYN HISTORY  Menarchal: 72 yo  LMP: 1990 Contraceptive:no    HRT: one year in [place]: Family History  Problem Relation Age of Onset  . COPD Father   . Heart disease Father     ALLERGIES:  is allergic to fosamax [alendronate sodium].  MEDICATIONS:  Current Outpatient Medications  Medication Sig Dispense Refill  . ALPRAZolam (XANAX) 0.5 MG tablet TAKE 1 TABLET BY MOUTH TWICE DAILY AS NEEDED 180 tablet 0  . Calcium-Magnesium-Zinc 167-83-8 MG TABS Take 1 tablet by mouth daily.    . cyclobenzaprine (FLEXERIL) 10 MG tablet Take 1 tablet (10 mg total) by mouth 2 (two) times daily as needed. 180 tablet 3  . letrozole (FEMARA) 2.5 MG tablet Take 1 tablet (2.5 mg total) by mouth daily. 90 tablet 1  . levothyroxine (SYNTHROID, LEVOTHROID) 75 MCG tablet Take 1 tablet (75 mcg total) by mouth daily. 90 tablet 0  . pantoprazole (PROTONIX) 40  MG tablet TAKE ONE TABLET BY MOUTH ONCE DAILY 90 tablet 1  . simvastatin (ZOCOR) 20 MG tablet Take 1 tablet (20 mg total) by mouth at bedtime. 90 tablet 3  . verapamil (CALAN-SR) 240 MG CR tablet Take 1 tablet (240 mg total) by mouth daily. 90 tablet 3   No current facility-administered medications for this visit.     REVIEW OF SYSTEMS:   Constitutional: Denies fevers, chills or abnormal night sweats (+) weight gain Eyes: Denies blurriness of vision, double vision or watery eyes Ears, nose, mouth, throat, and face: Denies mucositis or sore throat Respiratory: Denies cough, dyspnea or wheezes Cardiovascular: Denies palpitation, chest discomfort or lower extremity swelling Gastrointestinal:  Denies nausea, heartburn or change in bowel habits Skin: Denies abnormal skin rashes Lymphatics: Denies new lymphadenopathy or easy bruising MSK: (+) trigger fingers in left hand, (+) right torn meniscus with pain Neurological:Denies numbness, tingling or new weaknesses Behavioral/Psych: Mood is stable, no new changes  All other systems were reviewed with the patient and are negative.  PHYSICAL EXAMINATION:  ECOG PERFORMANCE STATUS: 0 - Asymptomatic  Vitals:   10/21/17 1148  BP: (!) 123/92  Pulse: (!) 117  Resp: 19  Temp: 98.9 F (37.2 C)  SpO2: 100%   Filed Weights   10/21/17 1148  Weight: 139 lb 4.8 oz (63.2 kg)    GENERAL:alert, no distress and comfortable SKIN: skin color, texture, turgor are normal, no rashes or significant lesions EYES: normal, conjunctiva are pink and non-injected, sclera clear OROPHARYNX:no exudate, no erythema and lips, buccal mucosa, and tongue normal  NECK: supple, thyroid normal size, non-tender, without nodularity LYMPH:  no palpable lymphadenopathy in the cervical, axillary or inguinal LUNGS: clear to auscultation and percussion with normal breathing effort HEART: regular rate & rhythm and no murmurs and no lower extremity edema ABDOMEN:abdomen soft,  non-tender and normal bowel sounds Musculoskeletal:no cyanosis of digits and no clubbing  PSYCH: alert & oriented x 3 with fluent speech NEURO: no focal motor/sensory deficits Breasts: Status post right simple mastectomy and reconstruction. Surgical scar is healing well. Palpation of the left breasts and axilla revealed no obvious mass that I could appreciate.   LABORATORY DATA:  I have reviewed the data as listed CBC  Latest Ref Rng & Units 10/21/2017 04/17/2017 12/17/2016  WBC 3.9 - 10.3 K/uL 8.5 6.2 6.3  Hemoglobin 11.6 - 15.9 g/dL 12.2 12.2 12.5  Hematocrit 34.8 - 46.6 % 35.9 36.8 37.8  Platelets 145 - 400 K/uL 255 241 239    CMP Latest Ref Rng & Units 10/21/2017 04/17/2017 12/17/2016  Glucose 70 - 140 mg/dL 87 124 92  BUN 7 - 26 mg/dL 26 15.2 20  Creatinine 0.60 - 1.10 mg/dL 1.12(H) 1.1 1.01(H)  Sodium 136 - 145 mmol/L 139 142 142  Potassium 3.3 - 4.7 mmol/L 4.5 4.2 4.9  Chloride 98 - 109 mmol/L 107 - 109  CO2 22 - 29 mmol/L '23 24 24  ' Calcium 8.4 - 10.4 mg/dL 8.5 8.9 8.9  Total Protein 6.4 - 8.3 g/dL 6.2(L) 7.0 6.4  Total Bilirubin 0.2 - 1.2 mg/dL 0.4 0.46 0.4  Alkaline Phos 40 - 150 U/L 56 59 58  AST 5 - 34 U/L '13 15 14  ' ALT 0 - 55 U/L '14 17 13     ' PATHOLOGY REPORT" Diagnosis 03/23/2015 Breast, simple mastectomy, right - INVASIVE ADENOCARCINOMA, SEE COMMENT. - PREVIOUS BIOPSY SITE PRESENT. - SEE TUMOR SYNOPTIC TEMPLATE BELOW. Microscopic Comment BREAST, INVASIVE TUMOR, WITHOUT LYMPH NODES PRESENT Specimen, including laterality: Right breast without lymph node sampling. Procedure: Simple mastectomy. Histologic type: Ductal. Grade: 3 of 3. Tubule formation: 3. Nuclear pleomorphism: 3. Mitotic: 2. Tumor size (gross measurement): 2.9 cm. Invasive, distance to closest margin: 0.5 cm (posterior). In-situ, distance to closest margin: N/A. If margin positive, focally or broadly: N/A. Lymphovascular invasion: Absent. Ductal carcinoma in situ: Absent. Grade: N/A. Extensive  intraductal component: N/A. Lobular neoplasia: Absent. Tumor focality: Unifocal. Treatment effect: None. If present, treatment effect in breast tissue, lymph nodes or both: N/A. Extent of tumor: Skin: N/A. Nipple: N/A. Skeletal muscle: Negative for tumor.  Estrogen receptor: Not repeated, previous study demonstrated 100% positivity ([id number]). Progesterone receptor: Not repeated, previous study demonstrated 77% proliferation rate ([id number]). Her 2 neu: Repeated, previous study demonstrated no amplification (1.90) ([id number]). Ki-67: Not repeated, previous study demonstrated 20% proliferation rate ([id number]). Non-neoplastic breast: Previous biopsy site, fibrocytic change, coarse vascular calcifications. TNM: pT2, pNX, pMX.  ONCOTYPE RS 8  RADIOGRAPHIC STUDIES: I have personally reviewed the radiological images as listed and agreed with the findings in the report.   Mammogram 12/23/2016 IMPRESSION: No mammographic evidence of malignancy. A result letter of this screening mammogram will be mailed directly to the patient.  MM screening tomo Left 12/15/2015  IMPRESSION: No mammographic evidence of malignancy. A result letter of this screening mammogram will be mailed directly to the patient.  RECOMMENDATION: Screening mammogram of the left breast in one year.  BI-RADS CATEGORY 1: Negative.  ASSESSMENT & PLAN:  72 y.o. female, with remote history of right breast cancer in 1996, status post lumpectomy with lymph node dissection, adjuvant chemotherapy and irradiation. She was found to have a right breast mass by screening mammogram.  1. Recurrent right breast invasive ductal carcinoma, pT2N0, stage II, grade 3, ER positive, PR positive, HER-2 negative - she has been tolerating letrozole very well since starting in 05/04/2015, we'll continue for total of at least 5 years. -I again encouraged her to continue healthy diet and exercise, and watch her weight.  -She is  clinically doing well. Lab reviewed, her CBC within normal limits. Her physical exam and her 12/2016 mammogram were unremarkable. There is no clinical concern for recurrence. - Will continue to monitor every 6 months while she is on Letrozole.  Refilled today. Next mammogram 12/2017 -F/u in 6 months   2. Osteoporosis -She has known history of osteoporosis.  Last bone density scan on 07/12/2016 showed improvement into the osteopenia range - she is getting Prolia at her PCP's office  Every 6 months. -continue calcium 1 g, vitamin D at least 1000 units daily, and weight-bearing exercise distress or bone. - will check vitamin D yearly  3. Arthritis - she will follow-up with her orthopedic surgeon for the right knee arthritis - we previously discussed letrozole can cause muscular and joint discomfort. Her arthralgia has been stable since she started letrozole. -She is being followed by a hand and orthopedic surgeon for her trigger finger and torn meniscus.    PLAN -continue letrozole. Refill today -Mammogram in 12/2017  -return to clinic in 6 months for follow-up with lab.   All questions were answered. The patient knows to call the clinic with any problems, questions or concerns. I spent 15 minutes counseling the patient face to face. The total time spent in the appointment was 20 minutes and more than 50% was on counseling.  This document serves as a record of services personally performed by [name], MD. It was created on her behalf by Joslyn Devon, a trained medical scribe. The creation of this record is based on the scribe's personal observations and the provider's statements to them.    I have reviewed the above documentation for accuracy and completeness, and I agree with the above.     [name], MD 10/21/2017

## 2017-10-21 ENCOUNTER — Inpatient Hospital Stay: Payer: PPO | Admitting: Hematology

## 2017-10-21 ENCOUNTER — Inpatient Hospital Stay: Payer: PPO | Attending: Hematology

## 2017-10-21 ENCOUNTER — Telehealth: Payer: Self-pay | Admitting: Hematology

## 2017-10-21 ENCOUNTER — Encounter: Payer: Self-pay | Admitting: Hematology

## 2017-10-21 VITALS — BP 123/92 | HR 117 | Temp 98.9°F | Resp 19 | Ht <= 58 in | Wt 139.3 lb

## 2017-10-21 DIAGNOSIS — M1711 Unilateral primary osteoarthritis, right knee: Secondary | ICD-10-CM | POA: Insufficient documentation

## 2017-10-21 DIAGNOSIS — C50411 Malignant neoplasm of upper-outer quadrant of right female breast: Secondary | ICD-10-CM | POA: Diagnosis not present

## 2017-10-21 DIAGNOSIS — Z170 Estrogen receptor positive status [ER+]: Secondary | ICD-10-CM

## 2017-10-21 DIAGNOSIS — M8580 Other specified disorders of bone density and structure, unspecified site: Secondary | ICD-10-CM

## 2017-10-21 DIAGNOSIS — Z79811 Long term (current) use of aromatase inhibitors: Secondary | ICD-10-CM

## 2017-10-21 DIAGNOSIS — I10 Essential (primary) hypertension: Secondary | ICD-10-CM

## 2017-10-21 DIAGNOSIS — M810 Age-related osteoporosis without current pathological fracture: Secondary | ICD-10-CM

## 2017-10-21 DIAGNOSIS — C50911 Malignant neoplasm of unspecified site of right female breast: Secondary | ICD-10-CM

## 2017-10-21 LAB — COMPREHENSIVE METABOLIC PANEL
ALT: 14 U/L (ref 0–55)
AST: 13 U/L (ref 5–34)
Albumin: 3.6 g/dL (ref 3.5–5.0)
Alkaline Phosphatase: 56 U/L (ref 40–150)
Anion gap: 9 (ref 3–11)
BILIRUBIN TOTAL: 0.4 mg/dL (ref 0.2–1.2)
BUN: 26 mg/dL (ref 7–26)
CALCIUM: 8.5 mg/dL (ref 8.4–10.4)
CO2: 23 mmol/L (ref 22–29)
CREATININE: 1.12 mg/dL — AB (ref 0.60–1.10)
Chloride: 107 mmol/L (ref 98–109)
GFR, EST AFRICAN AMERICAN: 56 mL/min — AB (ref >60)
GFR, EST NON AFRICAN AMERICAN: 49 mL/min — AB (ref >60)
Glucose, Bld: 87 mg/dL (ref 70–140)
Potassium: 4.5 mmol/L (ref 3.3–4.7)
Sodium: 139 mmol/L (ref 136–145)
Total Protein: 6.2 g/dL — ABNORMAL LOW (ref 6.4–8.3)

## 2017-10-21 LAB — CBC WITH DIFFERENTIAL/PLATELET
ABS GRANULOCYTE: 4.8 10*3/uL (ref 1.5–6.5)
Basophils Absolute: 0.1 10*3/uL (ref 0.0–0.1)
Basophils Relative: 1 %
Eosinophils Absolute: 0.6 10*3/uL — ABNORMAL HIGH (ref 0.0–0.5)
Eosinophils Relative: 7 %
HEMATOCRIT: 35.9 % (ref 34.8–46.6)
Hemoglobin: 12.2 g/dL (ref 11.6–15.9)
LYMPHS ABS: 2.3 10*3/uL (ref 0.9–3.3)
LYMPHS PCT: 27 %
MCH: 29.6 pg (ref 25.1–34.0)
MCHC: 34 g/dL (ref 31.5–36.0)
MCV: 87.3 fL (ref 79.5–101.0)
MONO ABS: 0.7 10*3/uL (ref 0.1–0.9)
Monocytes Relative: 8 %
Neutro Abs: 4.8 10*3/uL (ref 1.5–6.5)
Neutrophils Relative %: 57 %
Platelets: 255 10*3/uL (ref 145–400)
RBC: 4.11 MIL/uL (ref 3.70–5.45)
RDW: 13.7 % (ref 11.2–16.1)
WBC: 8.5 10*3/uL (ref 3.9–10.3)

## 2017-10-21 MED ORDER — LETROZOLE 2.5 MG PO TABS: 2.5000 mg | ORAL_TABLET | Freq: Every day | ORAL | 1 refills | Status: DC

## 2017-10-21 NOTE — Telephone Encounter (Signed)
Scheduled appt per 1/8 los - Patient is aware of appt - did not want AVS or calender printed.

## 2017-10-23 LAB — VITAMIN D 25 HYDROXY (VIT D DEFICIENCY, FRACTURES): Vit D, 25-Hydroxy: 34.6 ng/mL (ref 30.0–100.0)

## 2017-10-27 ENCOUNTER — Telehealth: Payer: Self-pay | Admitting: *Deleted

## 2017-10-27 DIAGNOSIS — M1711 Unilateral primary osteoarthritis, right knee: Secondary | ICD-10-CM | POA: Diagnosis not present

## 2017-10-27 NOTE — Telephone Encounter (Signed)
-----   Message from [name], MD sent at 10/26/2017  6:05 PM EST ----- Please let pt know that her vitd level was normal last week, thanks  [name]  10/26/2017

## 2017-10-27 NOTE — Telephone Encounter (Signed)
TCT patient with Vitamin D level from last week.  Spoke with patient and informed her that level was normal.  Pt voiced understanding.  No other questions or concerns.

## 2017-11-03 DIAGNOSIS — M1711 Unilateral primary osteoarthritis, right knee: Secondary | ICD-10-CM | POA: Diagnosis not present

## 2017-11-10 DIAGNOSIS — M1711 Unilateral primary osteoarthritis, right knee: Secondary | ICD-10-CM | POA: Diagnosis not present

## 2017-11-21 ENCOUNTER — Other Ambulatory Visit: Payer: Self-pay

## 2017-11-21 DIAGNOSIS — M810 Age-related osteoporosis without current pathological fracture: Secondary | ICD-10-CM

## 2017-11-21 DIAGNOSIS — E039 Hypothyroidism, unspecified: Secondary | ICD-10-CM

## 2017-11-21 DIAGNOSIS — Z0000 Encounter for general adult medical examination without abnormal findings: Secondary | ICD-10-CM

## 2017-11-21 DIAGNOSIS — I10 Essential (primary) hypertension: Secondary | ICD-10-CM

## 2017-11-21 DIAGNOSIS — Z1321 Encounter for screening for nutritional disorder: Secondary | ICD-10-CM

## 2017-11-22 ENCOUNTER — Other Ambulatory Visit: Payer: Self-pay | Admitting: Internal Medicine

## 2017-11-26 ENCOUNTER — Other Ambulatory Visit: Payer: Self-pay | Admitting: Internal Medicine

## 2017-12-15 ENCOUNTER — Other Ambulatory Visit: Payer: Self-pay

## 2017-12-17 ENCOUNTER — Telehealth: Payer: Self-pay

## 2017-12-17 NOTE — Telephone Encounter (Signed)
Flexeril was approved through patient's insurance from 12/16/17-10-13-18. Faxed a copy to patient's pharmacy.

## 2017-12-19 ENCOUNTER — Other Ambulatory Visit: Payer: PPO | Admitting: Internal Medicine

## 2017-12-19 NOTE — Addendum Note (Signed)
Addended by: Mady Haagensen on: 12/19/2017 09:35 AM   Modules accepted: Orders

## 2017-12-22 ENCOUNTER — Other Ambulatory Visit: Payer: PPO | Admitting: Internal Medicine

## 2017-12-22 DIAGNOSIS — E039 Hypothyroidism, unspecified: Secondary | ICD-10-CM | POA: Diagnosis not present

## 2017-12-22 DIAGNOSIS — F439 Reaction to severe stress, unspecified: Secondary | ICD-10-CM

## 2017-12-22 DIAGNOSIS — C50911 Malignant neoplasm of unspecified site of right female breast: Secondary | ICD-10-CM | POA: Diagnosis not present

## 2017-12-22 DIAGNOSIS — F411 Generalized anxiety disorder: Secondary | ICD-10-CM

## 2017-12-22 DIAGNOSIS — E7849 Other hyperlipidemia: Secondary | ICD-10-CM | POA: Diagnosis not present

## 2017-12-22 DIAGNOSIS — I10 Essential (primary) hypertension: Secondary | ICD-10-CM | POA: Diagnosis not present

## 2017-12-22 DIAGNOSIS — K219 Gastro-esophageal reflux disease without esophagitis: Secondary | ICD-10-CM

## 2017-12-22 DIAGNOSIS — M810 Age-related osteoporosis without current pathological fracture: Secondary | ICD-10-CM

## 2017-12-23 ENCOUNTER — Ambulatory Visit (INDEPENDENT_AMBULATORY_CARE_PROVIDER_SITE_OTHER): Payer: PPO | Admitting: Internal Medicine

## 2017-12-23 ENCOUNTER — Encounter: Payer: Self-pay | Admitting: Internal Medicine

## 2017-12-23 VITALS — BP 120/88 | HR 100 | Ht <= 58 in | Wt 149.0 lb

## 2017-12-23 DIAGNOSIS — Z0000 Encounter for general adult medical examination without abnormal findings: Secondary | ICD-10-CM

## 2017-12-23 DIAGNOSIS — I10 Essential (primary) hypertension: Secondary | ICD-10-CM | POA: Diagnosis not present

## 2017-12-23 DIAGNOSIS — M810 Age-related osteoporosis without current pathological fracture: Secondary | ICD-10-CM

## 2017-12-23 DIAGNOSIS — Z6831 Body mass index (BMI) 31.0-31.9, adult: Secondary | ICD-10-CM | POA: Diagnosis not present

## 2017-12-23 DIAGNOSIS — M1711 Unilateral primary osteoarthritis, right knee: Secondary | ICD-10-CM | POA: Diagnosis not present

## 2017-12-23 DIAGNOSIS — F439 Reaction to severe stress, unspecified: Secondary | ICD-10-CM

## 2017-12-23 DIAGNOSIS — E7849 Other hyperlipidemia: Secondary | ICD-10-CM | POA: Diagnosis not present

## 2017-12-23 DIAGNOSIS — E039 Hypothyroidism, unspecified: Secondary | ICD-10-CM

## 2017-12-23 DIAGNOSIS — K219 Gastro-esophageal reflux disease without esophagitis: Secondary | ICD-10-CM | POA: Diagnosis not present

## 2017-12-23 DIAGNOSIS — F411 Generalized anxiety disorder: Secondary | ICD-10-CM

## 2017-12-23 DIAGNOSIS — C50911 Malignant neoplasm of unspecified site of right female breast: Secondary | ICD-10-CM | POA: Diagnosis not present

## 2017-12-23 LAB — POCT URINALYSIS DIPSTICK
APPEARANCE: NORMAL
BILIRUBIN UA: NEGATIVE
Blood, UA: NEGATIVE
Glucose, UA: NEGATIVE
Ketones, UA: NEGATIVE
LEUKOCYTES UA: NEGATIVE
Nitrite, UA: NEGATIVE
Odor: NORMAL
PH UA: 6.5 (ref 5.0–8.0)
Protein, UA: NEGATIVE
SPEC GRAV UA: 1.01 (ref 1.010–1.025)
UROBILINOGEN UA: 0.2 U/dL

## 2017-12-23 LAB — CBC WITH DIFFERENTIAL/PLATELET
BASOS ABS: 78 {cells}/uL (ref 0–200)
Basophils Relative: 1.2 %
EOS ABS: 325 {cells}/uL (ref 15–500)
EOS PCT: 5 %
HCT: 35.7 % (ref 35.0–45.0)
HEMOGLOBIN: 12.2 g/dL (ref 11.7–15.5)
Lymphs Abs: 1924 cells/uL (ref 850–3900)
MCH: 29.3 pg (ref 27.0–33.0)
MCHC: 34.2 g/dL (ref 32.0–36.0)
MCV: 85.6 fL (ref 80.0–100.0)
MPV: 10 fL (ref 7.5–12.5)
Monocytes Relative: 8.2 %
NEUTROS ABS: 3640 {cells}/uL (ref 1500–7800)
NEUTROS PCT: 56 %
Platelets: 263 10*3/uL (ref 140–400)
RBC: 4.17 10*6/uL (ref 3.80–5.10)
RDW: 13.1 % (ref 11.0–15.0)
Total Lymphocyte: 29.6 %
WBC mixed population: 533 cells/uL (ref 200–950)
WBC: 6.5 10*3/uL (ref 3.8–10.8)

## 2017-12-23 LAB — COMPLETE METABOLIC PANEL WITH GFR
AG RATIO: 1.8 (calc) (ref 1.0–2.5)
ALBUMIN MSPROF: 4.3 g/dL (ref 3.6–5.1)
ALT: 15 U/L (ref 6–29)
AST: 16 U/L (ref 10–35)
Alkaline phosphatase (APISO): 60 U/L (ref 33–130)
BILIRUBIN TOTAL: 0.5 mg/dL (ref 0.2–1.2)
BUN / CREAT RATIO: 16 (calc) (ref 6–22)
BUN: 19 mg/dL (ref 7–25)
CALCIUM: 9.5 mg/dL (ref 8.6–10.4)
CHLORIDE: 106 mmol/L (ref 98–110)
CO2: 29 mmol/L (ref 20–32)
Creat: 1.16 mg/dL — ABNORMAL HIGH (ref 0.60–0.93)
GFR, EST AFRICAN AMERICAN: 55 mL/min/{1.73_m2} — AB (ref >=60)
GFR, EST NON AFRICAN AMERICAN: 48 mL/min/{1.73_m2} — AB (ref >=60)
Globulin: 2.4 g/dL (calc) (ref 1.9–3.7)
Glucose, Bld: 101 mg/dL — ABNORMAL HIGH (ref 65–99)
Potassium: 4.4 mmol/L (ref 3.5–5.3)
Sodium: 145 mmol/L (ref 135–146)
TOTAL PROTEIN: 6.7 g/dL (ref 6.1–8.1)

## 2017-12-23 LAB — VITAMIN D 25 HYDROXY (VIT D DEFICIENCY, FRACTURES): VIT D 25 HYDROXY: 33 ng/mL (ref 30–100)

## 2017-12-23 LAB — LIPID PANEL
CHOLESTEROL: 175 mg/dL (ref <200)
HDL: 60 mg/dL (ref >50)
LDL Cholesterol (Calc): 86 mg/dL (calc)
Non-HDL Cholesterol (Calc): 115 mg/dL (calc) (ref <130)
TRIGLYCERIDES: 196 mg/dL — AB (ref <150)
Total CHOL/HDL Ratio: 2.9 (calc) (ref <5.0)

## 2017-12-23 LAB — TSH: TSH: 1.98 m[IU]/L (ref 0.40–4.50)

## 2017-12-23 NOTE — Progress Notes (Signed)
Subjective:    Patient ID: Tara Rodgers, female    DOB: 06-20-1946, 72 y.o.   MRN: [id number]  HPI 72 year Female for medicare Wellness, health maintenance and evaluation of medical issues.  History of osteopenia.Taking Prolia about 3 years after toe fracture. Copay was about $300. Lowest T score -2.8 in 2017. Bone density due Sept 2019. Did not take Prolia in February per Dr. Layne Benton.   Seen at [facility] recently and had knee injected.  Apparently has torn meniscus right knee.  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  Past medical history: She developed right carpal tunnel syndrome 1995 but it improved, had surgery for left carpal tunnel syndrome 2010.  Bilateral tubal ligation 1986.  Left tennis elbow release 1990.  Foot and ankle surgery 1999.  Right breast lumpectomy with axillary node dissection 1996.  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  She underwent 6 months of chemotherapy with CMF and adjuvant radiation.  She took estrogen replacement in the early 1990s.  Had suprapubic sling March 2008.  History of perennial allergic rhinitis and vasomotor rhinitis.  Used to be followed by Dr. Donneta Romberg for allergies in 2011.  In 2001 skin test were positive for black walnut, dog, cat, house dust, dust mites, cockroach and mold as well as weeds.  Remote history of PVCs 1991 treated with verapamil which she also takes for hypertension.  She has never had a colonoscopy.  She has declined.  She had sigmoidoscopy done in 1998 by Dr. Teena Irani.  Social history: Does not smoke or consume alcohol except occasionally.  She will do not children a son and a daughter.  Has been married twice.  Present husband is a Dealer and has heart issues.  He also has history of alcohol abuse.  This is  been stressful.  She is retired from [employer].  Family history: Father died in 7902 of complications of COPD at age 26 with history of coronary artery disease.  One half sister with thyroid issues.  In June 2016 she underwent right mastectomy with reconstruction surgery for invasive ductal carcinoma right breast.  This was a 2.5 x 2.4 x 2.4 cm mass superior right breast.  Tumor was ER positive PR positive HER-2/neu negative.  She continues to be followed by oncology and is doing well.  Tumor was classified as stage IIa she is currently on Femara.     Review of Systems  Constitutional: Positive for fatigue.  Respiratory: Negative.   Cardiovascular: Negative.   Gastrointestinal: Negative.   Genitourinary: Negative.   Musculoskeletal:       Right knee pain  Neurological: Negative.   Psychiatric/Behavioral:       Anxiety over family member with cancer       Objective:   Physical Exam  Constitutional: She is oriented to person, place, and time. She appears well-developed and well-nourished. No distress.  HENT:  Head: Normocephalic and atraumatic.  Right Ear: External ear normal.  Left Ear: External ear normal.  Mouth/Throat: Oropharynx is clear and moist.  Eyes: Pupils are equal, round, and reactive to light. Conjunctivae and EOM are normal. Right eye exhibits no discharge. Left eye exhibits no discharge. No scleral icterus.  Neck: Neck supple. No JVD present. No thyromegaly present.  Cardiovascular: Normal rate, regular rhythm, normal heart sounds and intact distal pulses.  No murmur heard. Pulmonary/Chest: Effort  normal and breath sounds normal. No respiratory distress. She has no rales. She exhibits no tenderness.  Reconstruction of right breast.  No palpable breast masses  Abdominal: She exhibits no distension and no mass. There is no tenderness. There is no rebound and no guarding.  Genitourinary:  Genitourinary Comments: Bimanual normal  Musculoskeletal: She exhibits  no edema.  Lymphadenopathy:    She has no cervical adenopathy.  Neurological: She is alert and oriented to person, place, and time. She has normal reflexes. No cranial nerve deficit. Coordination normal.  Skin: Skin is warm and dry. No rash noted. She is not diaphoretic.  Psychiatric: Her behavior is normal. Judgment and thought content normal.  Anxious  Vitals reviewed.         Assessment & Plan:  Situational stress which seems to be fairly chronic for this patient with family issues  Allergic rhinitis  History of right breast cancer in 1996 and again in 2016 followed by oncology.  Status post right breast reconstruction  Essential hypertension  Anxiety depression  Hypothyroidism  Hyperlipidemia  GE reflux  Osteoporosis-has received Prolia by Dr. Layne Benton  Osteoarthritis of knees seen at [facility] for [place] has declined colonoscopy.  We had this conversation again today.  She might would consider [place] guard.  Plan: She has mildly elevated creatinine of 1.16 likely due to chronic hypertension.  This will be monitored.  TSH is normal.  Triglycerides are elevated at 196.  She needs to watch her diet lose weight.  She needs to exercise.  She will return in 6 months for follow-up.  Subjective:   Patient presents for Medicare Annual/Subsequent preventive examination.  Review Past Medical/Family/Social: See above   Risk Factors  Current exercise habits: Not exercising as much as she should Dietary issues discussed: Low-fat low carbohydrate  Cardiac risk factors: Hyperlipidemia, impaired glucose tolerance  Depression Screen  (Note: if answer to either of the following is "Yes", a more complete depression screening is indicated)   Over the past two weeks, have you felt down, depressed or hopeless? No  Over the past two weeks, have you felt little interest or pleasure in doing things? Yes- had situational stress Have you lost interest  or pleasure in daily life? No Do you often feel hopeless? No Do you cry easily over simple problems? No   Activities of Daily Living  In your present state of health, do you have any difficulty performing the following activities?:   Driving? No  Managing money? No  Feeding yourself? No  Getting from bed to chair? No  Climbing a flight of stairs? No  Preparing food and eating?: No  Bathing or showering? No  Getting dressed: No  Getting to the toilet? No  Using the toilet:No  Moving around from place to place: No  In the past year have you fallen or had a near fall?:No  Are you sexually active? No  Do you have more than one partner? No   Hearing Difficulties:  is Do you often ask people to speak up or repeat themselves?  he has Do you experience ringing or noises in your ears?  Yes Do you have difficulty understanding soft or whispered voices?  he has Do you feel that you have a problem with memory? Do you often misplace items? No    Home Safety:  Do you have a smoke alarm at your residence? Yes Do you have grab bars in the bathroom?  No Do you have throw rugs in  your house?  No   Cognitive Testing  Alert? Yes Normal Appearance?Yes  Oriented to person? Yes Place? Yes  Time? Yes  Recall of three objects? Yes  Can perform simple calculations? Yes  Displays appropriate judgment?Yes  Can read the correct time from a watch face?Yes   List the Names of Other Physician/Practitioners you currently use:  See referral list for the physicians patient is currently seeing.     Review of Systems: See above   Objective:     General appearance: Appears stated age and mildly overweight.  BMI 31 Head: Normocephalic, without obvious abnormality, atraumatic  Eyes: conj clear, EOMi PEERLA  Ears: normal TM's and external ear canals both ears  Nose: Nares normal. Septum midline. Mucosa normal. No drainage or sinus tenderness.  Throat: lips, mucosa, and tongue normal; teeth and  gums normal  Neck: no adenopathy, no carotid bruit, no JVD, supple, symmetrical, trachea midline and thyroid not enlarged, symmetric, no tenderness/mass/nodules  No CVA tenderness.  Lungs: clear to auscultation bilaterally  Breasts: normal appearance, no masses or tenderness Heart: regular rate and rhythm, S1, S2 normal, no murmur, click, rub or gallop  Abdomen: soft, non-tender; bowel sounds normal; no masses, no organomegaly  Musculoskeletal: ROM normal in all joints, no crepitus, no deformity, Normal muscle strengthen. Back  is symmetric, no curvature. Skin: Skin color, texture, turgor normal. No rashes or lesions  Lymph nodes: Cervical, supraclavicular, and axillary nodes normal.  Neurologic: CN 2 -12 Normal, Normal symmetric reflexes. Normal coordination and gait  Psych: Alert & Oriented x 3, Mood appear stable.    Assessment:    Annual wellness medicare exam   Plan:    During the course of the visit the patient was educated and counseled about appropriate screening and preventive services including:  Annual mammogram  Have talked with her about colonoscopy.  She might would consider Cologuard      Patient Instructions (the written plan) was given to the patient.  Medicare Attestation  I have personally reviewed:  The patient's medical and social history  Their use of alcohol, tobacco or illicit drugs  Their current medications and supplements  The patient's functional ability including ADLs,fall risks, home safety risks, cognitive, and hearing and visual impairment  Diet and physical activities  Evidence for depression or mood disorders  The patient's weight, height, BMI, and visual acuity have been recorded in the chart. I have made referrals, counseling, and provided education to the patient based on review of the above and I have provided the patient with a written personalized care plan for preventive services.

## 2017-12-24 ENCOUNTER — Ambulatory Visit: Payer: PPO

## 2017-12-30 ENCOUNTER — Ambulatory Visit
Admission: RE | Admit: 2017-12-30 | Discharge: 2017-12-30 | Disposition: A | Discharge status: Home / Self Care | Payer: PPO | Source: Ambulatory Visit | Attending: Hematology | Admitting: Hematology

## 2017-12-30 DIAGNOSIS — Z1231 Encounter for screening mammogram for malignant neoplasm of breast: Secondary | ICD-10-CM | POA: Diagnosis not present

## 2017-12-30 DIAGNOSIS — C50911 Malignant neoplasm of unspecified site of right female breast: Secondary | ICD-10-CM

## 2018-01-02 ENCOUNTER — Telehealth: Payer: Self-pay | Admitting: Internal Medicine

## 2018-01-02 MED ORDER — SIMVASTATIN 20 MG PO TABS: 20.0000 mg | ORAL_TABLET | Freq: Every day | ORAL | 3 refills | Status: DC

## 2018-01-02 NOTE — Telephone Encounter (Signed)
Please refill x one year

## 2018-01-02 NOTE — Telephone Encounter (Signed)
Would like a refill for Zocor sent to her new pharmacy.    Pharmacy:  [facility] @ [address]  Phone:  [phone]

## 2018-01-08 ENCOUNTER — Other Ambulatory Visit: Payer: Self-pay

## 2018-01-08 MED ORDER — VERAPAMIL HCL ER 240 MG PO TBCR: 240.0000 mg | EXTENDED_RELEASE_TABLET | Freq: Every day | ORAL | 1 refills | Status: DC

## 2018-01-11 NOTE — Patient Instructions (Addendum)
It was a pleasure to see you today.  Continue same medications.  Please try to exercise and lose some weight.  Return in 6 months.  Reconsider colonoscopy.  Otherwise do cologuard.

## 2018-01-28 ENCOUNTER — Telehealth: Payer: Self-pay | Admitting: Internal Medicine

## 2018-01-28 ENCOUNTER — Encounter: Payer: Self-pay | Admitting: Internal Medicine

## 2018-01-28 DIAGNOSIS — Z0000 Encounter for general adult medical examination without abnormal findings: Secondary | ICD-10-CM

## 2018-01-28 MED ORDER — PANTOPRAZOLE SODIUM 40 MG PO TBEC: 40.0000 mg | DELAYED_RELEASE_TABLET | Freq: Every day | ORAL | 1 refills | Status: DC

## 2018-01-28 MED ORDER — VERAPAMIL HCL ER 240 MG PO TBCR: 240.0000 mg | EXTENDED_RELEASE_TABLET | Freq: Every day | ORAL | 1 refills | Status: DC

## 2018-01-28 MED ORDER — SIMVASTATIN 20 MG PO TABS: 20.0000 mg | ORAL_TABLET | Freq: Every day | ORAL | 3 refills | Status: DC

## 2018-01-28 MED ORDER — LEVOTHYROXINE SODIUM 75 MCG PO TABS: 75.0000 ug | ORAL_TABLET | Freq: Every day | ORAL | 0 refills | Status: DC

## 2018-01-28 MED ORDER — CYCLOBENZAPRINE HCL 10 MG PO TABS: 10.0000 mg | ORAL_TABLET | Freq: Two times a day (BID) | ORAL | 3 refills | Status: DC | PRN

## 2018-01-28 NOTE — Telephone Encounter (Signed)
Pt transferring to yet another pharmacy. Now [facility]. Have refilled meds to that pharmacy including Xanax x 6 months

## 2018-02-06 DIAGNOSIS — M1711 Unilateral primary osteoarthritis, right knee: Secondary | ICD-10-CM | POA: Diagnosis not present

## 2018-02-20 ENCOUNTER — Ambulatory Visit: Payer: Self-pay | Admitting: Orthopedic Surgery

## 2018-02-23 DIAGNOSIS — H2513 Age-related nuclear cataract, bilateral: Secondary | ICD-10-CM | POA: Diagnosis not present

## 2018-03-03 ENCOUNTER — Telehealth: Payer: Self-pay | Admitting: Internal Medicine

## 2018-03-03 MED ORDER — LEVOTHYROXINE SODIUM 75 MCG PO TABS: 75.0000 ug | ORAL_TABLET | Freq: Every day | ORAL | 1 refills | Status: DC

## 2018-03-03 NOTE — Telephone Encounter (Signed)
Refill x 6 months 

## 2018-03-03 NOTE — Telephone Encounter (Signed)
Sharaya Boruff Self [phone]  Lan called to say she needs a refill on her thyroid medicine and for it to be sent to [facility].

## 2018-03-04 NOTE — H&P (Signed)
TOTAL KNEE ADMISSION H&P  Patient is being admitted for right total knee arthroplasty.  Subjective:  Chief Complaint:right knee pain.  HPI: Tara Rodgers, 72 y.o. female, has a history of pain and functional disability in the right knee due to arthritis and has failed non-surgical conservative treatments for greater than 12 weeks to includecorticosteriod injections, use of assistive devices, weight reduction as appropriate and activity modification.  Onset of symptoms was gradual, starting 2 years ago with gradually worsening course since that time. The patient noted no past surgery on the right knee(s).  Patient currently rates pain in the right knee(s) at 7 out of 10 with activity. Patient has worsening of pain with activity and weight bearing, pain that interferes with activities of daily living and pain with passive range of motion.  Patient has evidence of subchondral sclerosis and joint space narrowing by imaging studies. There is no active infection.  Patient Active Problem List   Diagnosis Date Noted  . Genetic testing 02/13/2015  . Invasive ductal carcinoma of right breast in female [facility]) 01/17/2015  . Hypertension 09/19/2012  . History of breast cancer 09/19/2012  . Asthma 09/19/2012  . Allergic rhinitis 09/19/2012  . GE reflux 09/19/2012  . Anxiety 09/19/2012  . Hyperlipidemia 09/19/2012  . Hypothyroidism 09/19/2012  . Osteoporosis 09/19/2012   Past Medical History:  Diagnosis Date  . Anxiety   . Asthma    does not take any medications, hx of inhaler use  . Breast cancer [facility]) 1995/2016   right sided breast cancer in 08/1994  . Bronchitis    hx of  . GERD (gastroesophageal reflux disease)   . History of hiatal hernia   . Hypertension   . Hyperthyroidism     Past Surgical History:  Procedure Laterality Date  . BREAST BIOPSY Right 2016   malignant  . BREAST EXCISIONAL BIOPSY Left 1996   benign  . Carpel Tunnel  Bilateral   . INCONTINENCE SURGERY     with mesh  insertion about 7 years ago  . LATISSIMUS FLAP TO BREAST Right 03/23/2015   Procedure: LATISSIMUS FLAP TO BREAST WITH PLACEMENT OF IMPLANT FOR BREAST RECONSTRUCTION;  Surgeon: [name], MD;  Location: [place];  Service: Plastics;  Laterality: Right;  . MASTECTOMY Right 03/23/2015  . RECONSTRUCTION BREAST W/ LATISSIMUS DORSI FLAP Right 03/23/2015  . TONSILLECTOMY     as a child  . TOTAL MASTECTOMY Right 03/23/2015   Procedure: RIGHT TOTAL MASTECTOMY;  Surgeon: [name], MD;  Location: [place];  Service: General;  Laterality: Right;    No current facility-administered medications for this encounter.    Current Outpatient Medications  Medication Sig Dispense Refill Last Dose  . ALPRAZolam (XANAX) 0.5 MG tablet TAKE 1 TABLET BY MOUTH TWICE DAILY AS NEEDED (Patient taking differently: TAKE 1 TABLET BY MOUTH TWICE DAILY AS NEEDED FOR ANXIETY) 180 tablet 0 Taking  . cyclobenzaprine (FLEXERIL) 10 MG tablet Take 1 tablet (10 mg total) by mouth 2 (two) times daily as needed. (Patient taking differently: Take 10 mg by mouth 2 (two) times daily as needed for muscle spasms. ) 180 tablet 3   . ibuprofen (ADVIL,MOTRIN) 200 MG tablet Take 400 mg by mouth every 4 (four) hours as needed for headache or moderate pain.     Marland Kitchen letrozole (FEMARA) 2.5 MG tablet Take 1 tablet (2.5 mg total) by mouth daily. 90 tablet 1 Taking  . Lifitegrast (XIIDRA) 5 % SOLN Place 1 drop into both eyes 2 (two) times daily.     Marland Kitchen  naproxen (NAPROSYN) 500 MG tablet Take 500 mg by mouth 2 (two) times daily as needed for pain.     . pantoprazole (PROTONIX) 40 MG tablet Take 1 tablet (40 mg total) by mouth daily. (Patient taking differently: Take 40 mg by mouth daily. May take a second 40 mg dose as needed for acid reflux) 90 tablet 1   . simvastatin (ZOCOR) 20 MG tablet Take 1 tablet (20 mg total) by mouth at bedtime. 90 tablet 3   . verapamil (CALAN-SR) 240 MG CR tablet Take 1 tablet (240 mg total) by mouth daily. (Patient taking  differently: Take 120 mg by mouth daily. ) 90 tablet 1   . levothyroxine (SYNTHROID, LEVOTHROID) 75 MCG tablet Take 1 tablet (75 mcg total) by mouth daily. 90 tablet 1    Allergies  Allergen Reactions  . Fosamax [Alendronate Sodium] Other (See Comments)    Aching all over    Social History   Tobacco Use  . Smoking status: Never Smoker  . Smokeless tobacco: Never Used  Substance Use Topics  . Alcohol use: Yes    Comment: occasional    Family History  Problem Relation Age of Onset  . COPD Father   . Heart disease Father      Review of Systems  Constitutional: Negative.   HENT: Negative.   Eyes: Negative.   Cardiovascular: Negative for chest pain.  Gastrointestinal: Negative.   Genitourinary: Negative.   Musculoskeletal: Positive for joint pain.  Skin: Negative.   Neurological: Negative.   Endo/Heme/Allergies: Negative.   Psychiatric/Behavioral: Negative.     Objective:  Physical Exam  Constitutional: She is oriented to person, place, and time. She appears well-developed.  HENT:  Head: Normocephalic.  Eyes: EOM are normal.  Neck: Normal range of motion.  Cardiovascular: Normal heart sounds and intact distal pulses.  Respiratory: Effort normal.  GI: Soft.  Genitourinary:  Genitourinary Comments: Deferred  Musculoskeletal:  Right knee pain with rom. Calf is soft and non tender. 2 + pedal pulse.   Neurological: She is alert and oriented to person, place, and time.  Skin: Skin is warm and dry.  Psychiatric: Her behavior is normal.    Vital signs in last 24 hours: BP: ()/()  Arterial Line BP: ()/()   Labs:   Estimated body mass index is 31.14 kg/m as calculated from the following:   Height as of 12/23/17: 4\' 10"  (1.473 m).   Weight as of 12/23/17: 67.6 kg (149 lb).   Imaging Review Plain radiographs demonstrate moderate degenerative joint disease of the right knee(s). The overall alignment ismild varus. The bone quality appears to be good for age and  reported activity level.   Preoperative templating of the joint replacement has been completed, documented, and submitted to the Operating Room personnel in order to optimize intra-operative equipment management.   Anticipated LOS equal to or greater than 2 midnights due to - Age 67 and older with one or more of the following:  - Obesity  - Expected need for hospital services (PT, OT, Nursing) required for safe  discharge  - Anticipated need for postoperative skilled nursing care or inpatient rehab  - Active co-morbidities: None OR   - Unanticipated findings during/Post Surgery: None  - Patient is a high risk of re-admission due to: None     Assessment/Plan:  End stage arthritis, right knee   The patient history, physical examination, clinical judgment of the provider and imaging studies are consistent with end stage degenerative joint disease of the  right knee(s) and total knee arthroplasty is deemed medically necessary. The treatment options including medical management, injection therapy arthroscopy and arthroplasty were discussed at length. The risks and benefits of total knee arthroplasty were presented and reviewed. The risks due to aseptic loosening, infection, stiffness, patella tracking problems, thromboembolic complications and other imponderables were discussed. The patient acknowledged the explanation, agreed to proceed with the plan and consent was signed. Patient is being admitted for inpatient treatment for surgery, pain control, PT, OT, prophylactic antibiotics, VTE prophylaxis, progressive ambulation and ADL's and discharge planning. The patient is planning to be discharged home with home health services.   Will use IV tranexamic acid. Contraindications and adverse affects of Tranexamic acid discussed in detail. Patient denies any of these at this time and understands the risks and benefits.

## 2018-03-05 ENCOUNTER — Other Ambulatory Visit (HOSPITAL_COMMUNITY): Payer: Self-pay | Admitting: Emergency Medicine

## 2018-03-05 NOTE — Progress Notes (Signed)
CLEARANCE DR. [name] ON CHART

## 2018-03-05 NOTE — Patient Instructions (Signed)
Tara Rodgers  03/05/2018   Your procedure is scheduled on: 03-13-18   Report to [facility] Main  Entrance    Report to admitting at 10:45AM    Call this number if you have problems the morning of surgery [phone]     Remember: Do not eat food After Midnight. YOU MAY HAVE CLEAR LIQUIDS FROM MIDNIGHT UNTIL 7:00AM DAY OF SURGERY!     Take these medicines the morning of surgery with A SIP OF WATER: VERAPAMIL, LEVOTHYROXINE, XANAX IF NEEDED, PANTOPRAZOLE IF NEEDED                                You may not have any metal on your body including hair pins and              piercings  Do not wear jewelry, make-up, lotions, powders or perfumes, deodorant             Do not wear nail polish.  Do not shave  48 hours prior to surgery.        Do not bring valuables to the hospital. [place].  Contacts, dentures or bridgework may not be worn into surgery.  Leave suitcase in the car. After surgery it may be brought to your room.                 Please read over the following fact sheets you were given: _____________________________________________________________________     CLEAR LIQUID DIET   Foods Allowed                                                                     Foods Excluded  Coffee and tea, regular and decaf                             liquids that you cannot  Plain Jell-O in any flavor                                             see through such as: Fruit ices (not with fruit pulp)                                     milk, soups, orange juice  Iced Popsicles                                    All solid food Carbonated beverages, regular and diet                                    Cranberry, grape and apple juices Sports drinks like  Gatorade Lightly seasoned clear broth or consume(fat free) Sugar, honey syrup  Sample Menu Breakfast                                Lunch                                      Supper Cranberry juice                    Beef broth                            Chicken broth Jell-O                                     Grape juice                           Apple juice Coffee or tea                        Jell-O                                      Popsicle                                                Coffee or tea                        Coffee or tea  _____________________________________________________________________  [facility] - Preparing for Surgery Before surgery, you can play an important role.  Because skin is not sterile, your skin needs to be as free of germs as possible.  You can reduce the number of germs on your skin by washing with CHG (chlorahexidine gluconate) soap before surgery.  CHG is an antiseptic cleaner which kills germs and bonds with the skin to continue killing germs even after washing. Please DO NOT use if you have an allergy to CHG or antibacterial soaps.  If your skin becomes reddened/irritated stop using the CHG and inform your nurse when you arrive at Short Stay. Do not shave (including legs and underarms) for at least 48 hours prior to the first CHG shower.  You may shave your face/neck. Please follow these instructions carefully:  1.  Shower with CHG Soap the night before surgery and the  morning of Surgery.  2.  If you choose to wash your hair, wash your hair first as usual with your  normal  shampoo.  3.  After you shampoo, rinse your hair and body thoroughly to remove the  shampoo.                           4.  Use CHG as you would any other liquid soap.  You can apply chg directly  to the skin and wash  Gently with a scrungie or clean washcloth.  5.  Apply the CHG Soap to your body ONLY FROM THE NECK DOWN.   Do not use on face/ open                           Wound or open sores. Avoid contact with eyes, ears mouth and genitals (private parts).                       Wash face,  Genitals (private  parts) with your normal soap.             6.  Wash thoroughly, paying special attention to the area where your surgery  will be performed.  7.  Thoroughly rinse your body with warm water from the neck down.  8.  DO NOT shower/wash with your normal soap after using and rinsing off  the CHG Soap.                9.  Pat yourself dry with a clean towel.            10.  Wear clean pajamas.            11.  Place clean sheets on your bed the night of your first shower and do not  sleep with pets. Day of Surgery : Do not apply any lotions/deodorants the morning of surgery.  Please wear clean clothes to the hospital/surgery center.  FAILURE TO FOLLOW THESE INSTRUCTIONS MAY RESULT IN THE CANCELLATION OF YOUR SURGERY PATIENT SIGNATURE_________________________________  NURSE SIGNATURE__________________________________  ________________________________________________________________________   Tara Rodgers  An incentive spirometer is a tool that can help keep your lungs clear and active. This tool measures how well you are filling your lungs with each breath. Taking long deep breaths may help reverse or decrease the chance of developing breathing (pulmonary) problems (especially infection) following:  A long period of time when you are unable to move or be active. BEFORE THE PROCEDURE   If the spirometer includes an indicator to show your best effort, your nurse or respiratory therapist will set it to a desired goal.  If possible, sit up straight or lean slightly forward. Try not to slouch.  Hold the incentive spirometer in an upright position. INSTRUCTIONS FOR USE  1. Sit on the edge of your bed if possible, or sit up as far as you can in bed or on a chair. 2. Hold the incentive spirometer in an upright position. 3. Breathe out normally. 4. Place the mouthpiece in your mouth and seal your lips tightly around it. 5. Breathe in slowly and as deeply as possible, raising the piston or the  ball toward the top of the column. 6. Hold your breath for 3-5 seconds or for as long as possible. Allow the piston or ball to fall to the bottom of the column. 7. Remove the mouthpiece from your mouth and breathe out normally. 8. Rest for a few seconds and repeat Steps 1 through 7 at least 10 times every 1-2 hours when you are awake. Take your time and take a few normal breaths between deep breaths. 9. The spirometer may include an indicator to show your best effort. Use the indicator as a goal to work toward during each repetition. 10. After each set of 10 deep breaths, practice coughing to be sure your lungs are clear. If you have an incision (the cut made at the time of  surgery), support your incision when coughing by placing a pillow or rolled up towels firmly against it. Once you are able to get out of bed, walk around indoors and cough well. You may stop using the incentive spirometer when instructed by your caregiver.  RISKS AND COMPLICATIONS  Take your time so you do not get dizzy or light-headed.  If you are in pain, you may need to take or ask for pain medication before doing incentive spirometry. It is harder to take a deep breath if you are having pain. AFTER USE  Rest and breathe slowly and easily.  It can be helpful to keep track of a log of your progress. Your caregiver can provide you with a simple table to help with this. If you are using the spirometer at home, follow these instructions: [place] IF:   You are having difficultly using the spirometer.  You have trouble using the spirometer as often as instructed.  Your pain medication is not giving enough relief while using the spirometer.  You develop fever of 100.5 F (38.1 C) or higher. SEEK IMMEDIATE MEDICAL CARE IF:   You cough up bloody sputum that had not been present before.  You develop fever of 102 F (38.9 C) or greater.  You develop worsening pain at or near the incision site. MAKE SURE YOU:    Understand these instructions.  Will watch your condition.  Will get help right away if you are not doing well or get worse. Document Released: 02/10/2007 Document Revised: 12/23/2011 Document Reviewed: 04/13/2007 [facility] Patient Information 2014 [place], [place].   ________________________________________________________________________

## 2018-03-06 ENCOUNTER — Encounter (HOSPITAL_COMMUNITY)
Admission: RE | Admit: 2018-03-06 | Discharge: 2018-03-06 | Disposition: A | Discharge status: Home / Self Care | Payer: PPO | Source: Ambulatory Visit | Attending: Specialist | Admitting: Specialist

## 2018-03-06 ENCOUNTER — Other Ambulatory Visit: Payer: Self-pay

## 2018-03-06 ENCOUNTER — Encounter (HOSPITAL_COMMUNITY): Payer: Self-pay

## 2018-03-06 DIAGNOSIS — Z01812 Encounter for preprocedural laboratory examination: Secondary | ICD-10-CM | POA: Insufficient documentation

## 2018-03-06 DIAGNOSIS — I10 Essential (primary) hypertension: Secondary | ICD-10-CM | POA: Insufficient documentation

## 2018-03-06 DIAGNOSIS — Z01810 Encounter for preprocedural cardiovascular examination: Secondary | ICD-10-CM | POA: Insufficient documentation

## 2018-03-06 LAB — URINALYSIS, ROUTINE W REFLEX MICROSCOPIC
BACTERIA UA: NONE SEEN
BILIRUBIN URINE: NEGATIVE
Glucose, UA: NEGATIVE mg/dL
Hgb urine dipstick: NEGATIVE
Ketones, ur: NEGATIVE mg/dL
Nitrite: NEGATIVE
PH: 5 (ref 5.0–8.0)
Protein, ur: NEGATIVE mg/dL
SPECIFIC GRAVITY, URINE: 1.017 (ref 1.005–1.030)

## 2018-03-06 LAB — CBC
HEMATOCRIT: 37.5 % (ref 36.0–46.0)
Hemoglobin: 12.5 g/dL (ref 12.0–15.0)
MCH: 29.3 pg (ref 26.0–34.0)
MCHC: 33.3 g/dL (ref 30.0–36.0)
MCV: 88 fL (ref 78.0–100.0)
PLATELETS: 281 10*3/uL (ref 150–400)
RBC: 4.26 MIL/uL (ref 3.87–5.11)
RDW: 12.9 % (ref 11.5–15.5)
WBC: 9.6 10*3/uL (ref 4.0–10.5)

## 2018-03-06 LAB — BASIC METABOLIC PANEL
Anion gap: 11 (ref 5–15)
BUN: 17 mg/dL (ref 6–20)
CO2: 24 mmol/L (ref 22–32)
Calcium: 9.1 mg/dL (ref 8.9–10.3)
Chloride: 105 mmol/L (ref 101–111)
Creatinine, Ser: 1.13 mg/dL — ABNORMAL HIGH (ref 0.44–1.00)
GFR, EST AFRICAN AMERICAN: 56 mL/min — AB (ref >60)
GFR, EST NON AFRICAN AMERICAN: 48 mL/min — AB (ref >60)
Glucose, Bld: 100 mg/dL — ABNORMAL HIGH (ref 65–99)
POTASSIUM: 4.1 mmol/L (ref 3.5–5.1)
SODIUM: 140 mmol/L (ref 135–145)

## 2018-03-06 LAB — PROTIME-INR
INR: 0.96
Prothrombin Time: 12.6 seconds (ref 11.4–15.2)

## 2018-03-06 LAB — SURGICAL PCR SCREEN
MRSA, PCR: NEGATIVE
Staphylococcus aureus: NEGATIVE

## 2018-03-06 LAB — APTT: aPTT: 31 seconds (ref 24–36)

## 2018-03-07 LAB — ABO/RH: ABO/RH(D): A NEG

## 2018-03-12 MED ORDER — TRANEXAMIC ACID 1000 MG/10ML IV SOLN
1000.0000 mg | INTRAVENOUS | Status: AC
  Administered 2018-03-13: 1000 mg via INTRAVENOUS
  Filled 2018-03-12: qty 1100

## 2018-03-13 ENCOUNTER — Encounter (HOSPITAL_COMMUNITY)
Admission: RE | Disposition: A | Discharge status: Home Health | Payer: Self-pay | Source: Ambulatory Visit | Attending: Specialist

## 2018-03-13 ENCOUNTER — Inpatient Hospital Stay (HOSPITAL_COMMUNITY): Payer: PPO

## 2018-03-13 ENCOUNTER — Inpatient Hospital Stay (HOSPITAL_COMMUNITY)
Admission: RE | Admit: 2018-03-13 | Discharge: 2018-03-15 | DRG: 470 | Disposition: A | Discharge status: Home Health | Payer: PPO | Source: Ambulatory Visit | Attending: Specialist | Admitting: Specialist

## 2018-03-13 ENCOUNTER — Other Ambulatory Visit: Payer: Self-pay

## 2018-03-13 ENCOUNTER — Encounter (HOSPITAL_COMMUNITY): Payer: Self-pay | Admitting: *Deleted

## 2018-03-13 DIAGNOSIS — M179 Osteoarthritis of knee, unspecified: Secondary | ICD-10-CM

## 2018-03-13 DIAGNOSIS — M1711 Unilateral primary osteoarthritis, right knee: Principal | ICD-10-CM | POA: Diagnosis present

## 2018-03-13 DIAGNOSIS — I10 Essential (primary) hypertension: Secondary | ICD-10-CM | POA: Diagnosis present

## 2018-03-13 DIAGNOSIS — Z96659 Presence of unspecified artificial knee joint: Secondary | ICD-10-CM

## 2018-03-13 DIAGNOSIS — E785 Hyperlipidemia, unspecified: Secondary | ICD-10-CM | POA: Diagnosis present

## 2018-03-13 DIAGNOSIS — F419 Anxiety disorder, unspecified: Secondary | ICD-10-CM | POA: Diagnosis not present

## 2018-03-13 DIAGNOSIS — Z888 Allergy status to other drugs, medicaments and biological substances status: Secondary | ICD-10-CM

## 2018-03-13 DIAGNOSIS — M810 Age-related osteoporosis without current pathological fracture: Secondary | ICD-10-CM | POA: Diagnosis present

## 2018-03-13 DIAGNOSIS — J45909 Unspecified asthma, uncomplicated: Secondary | ICD-10-CM | POA: Diagnosis not present

## 2018-03-13 DIAGNOSIS — Z79811 Long term (current) use of aromatase inhibitors: Secondary | ICD-10-CM | POA: Diagnosis not present

## 2018-03-13 DIAGNOSIS — K219 Gastro-esophageal reflux disease without esophagitis: Secondary | ICD-10-CM | POA: Diagnosis not present

## 2018-03-13 DIAGNOSIS — Z79899 Other long term (current) drug therapy: Secondary | ICD-10-CM | POA: Diagnosis not present

## 2018-03-13 DIAGNOSIS — E039 Hypothyroidism, unspecified: Secondary | ICD-10-CM | POA: Diagnosis not present

## 2018-03-13 DIAGNOSIS — G8918 Other acute postprocedural pain: Secondary | ICD-10-CM | POA: Diagnosis not present

## 2018-03-13 HISTORY — PX: TOTAL KNEE ARTHROPLASTY: SHX125

## 2018-03-13 LAB — TYPE AND SCREEN
ABO/RH(D): A NEG
Antibody Screen: NEGATIVE

## 2018-03-13 SURGERY — ARTHROPLASTY, KNEE, TOTAL
Anesthesia: Spinal | Site: Knee | Laterality: Right

## 2018-03-13 MED ORDER — METOCLOPRAMIDE HCL 5 MG PO TABS: 5.0000 mg | ORAL_TABLET | Freq: Three times a day (TID) | ORAL | Status: DC | PRN

## 2018-03-13 MED ORDER — 0.9 % SODIUM CHLORIDE (POUR BTL) OPTIME
TOPICAL | Status: DC | PRN
  Administered 2018-03-13: 1000 mL

## 2018-03-13 MED ORDER — VERAPAMIL HCL ER 240 MG PO TBCR
240.0000 mg | EXTENDED_RELEASE_TABLET | Freq: Every day | ORAL | Status: DC
  Administered 2018-03-14 – 2018-03-15 (×2): 240 mg via ORAL
  Filled 2018-03-13 (×2): qty 1

## 2018-03-13 MED ORDER — LIFITEGRAST 5 % OP SOLN: 1.0000 [drp] | Freq: Two times a day (BID) | OPHTHALMIC | Status: DC

## 2018-03-13 MED ORDER — PHENOL 1.4 % MT LIQD: 1.0000 | OROMUCOSAL | Status: DC | PRN

## 2018-03-13 MED ORDER — POLYETHYLENE GLYCOL 3350 17 G PO PACK
17.0000 g | PACK | Freq: Every day | ORAL | Status: DC | PRN
Start: 2018-03-13 — End: 2018-03-15

## 2018-03-13 MED ORDER — METHOCARBAMOL 500 MG PO TABS
500.0000 mg | ORAL_TABLET | Freq: Four times a day (QID) | ORAL | Status: DC | PRN
  Administered 2018-03-14 – 2018-03-15 (×3): 500 mg via ORAL
  Filled 2018-03-13 (×3): qty 1

## 2018-03-13 MED ORDER — LIDOCAINE 2% (20 MG/ML) 5 ML SYRINGE
INTRAMUSCULAR | Status: AC
  Filled 2018-03-13: qty 5

## 2018-03-13 MED ORDER — PROPOFOL 10 MG/ML IV BOLUS
INTRAVENOUS | Status: AC
  Filled 2018-03-13: qty 20

## 2018-03-13 MED ORDER — OXYCODONE HCL 5 MG PO TABS: 10.0000 mg | ORAL_TABLET | ORAL | Status: DC | PRN

## 2018-03-13 MED ORDER — DEXAMETHASONE SODIUM PHOSPHATE 10 MG/ML IJ SOLN
10.0000 mg | Freq: Once | INTRAMUSCULAR | Status: AC
  Administered 2018-03-13: 10 mg via INTRAVENOUS

## 2018-03-13 MED ORDER — HYDROMORPHONE HCL 1 MG/ML IJ SOLN: 0.5000 mg | INTRAMUSCULAR | Status: DC | PRN

## 2018-03-13 MED ORDER — OXYCODONE HCL 5 MG PO TABS: 5.0000 mg | ORAL_TABLET | ORAL | 0 refills | Status: AC | PRN

## 2018-03-13 MED ORDER — PROPOFOL 500 MG/50ML IV EMUL
INTRAVENOUS | Status: DC | PRN
  Administered 2018-03-13: 100 ug/kg/min via INTRAVENOUS

## 2018-03-13 MED ORDER — ASPIRIN EC 325 MG PO TBEC: 325.0000 mg | DELAYED_RELEASE_TABLET | Freq: Two times a day (BID) | ORAL | 0 refills | Status: DC

## 2018-03-13 MED ORDER — FENTANYL CITRATE (PF) 100 MCG/2ML IJ SOLN
100.0000 ug | INTRAMUSCULAR | Status: AC | PRN
  Administered 2018-03-13: 50 ug via INTRAVENOUS
  Filled 2018-03-13: qty 2

## 2018-03-13 MED ORDER — ACETAMINOPHEN 325 MG PO TABS: 325.0000 mg | ORAL_TABLET | Freq: Four times a day (QID) | ORAL | Status: DC | PRN

## 2018-03-13 MED ORDER — METHOCARBAMOL 500 MG PO TABS: 500.0000 mg | ORAL_TABLET | Freq: Three times a day (TID) | ORAL | 1 refills | Status: DC | PRN

## 2018-03-13 MED ORDER — DEXAMETHASONE SODIUM PHOSPHATE 10 MG/ML IJ SOLN
10.0000 mg | Freq: Once | INTRAMUSCULAR | Status: AC
  Administered 2018-03-14: 10 mg via INTRAVENOUS
  Filled 2018-03-13: qty 1

## 2018-03-13 MED ORDER — MENTHOL 3 MG MT LOZG
1.0000 | LOZENGE | OROMUCOSAL | Status: DC | PRN
Start: 2018-03-13 — End: 2018-03-15

## 2018-03-13 MED ORDER — METHOCARBAMOL 1000 MG/10ML IJ SOLN
500.0000 mg | Freq: Four times a day (QID) | INTRAVENOUS | Status: DC | PRN
  Administered 2018-03-13: 500 mg via INTRAVENOUS
  Filled 2018-03-13: qty 550

## 2018-03-13 MED ORDER — BISACODYL 5 MG PO TBEC: 5.0000 mg | DELAYED_RELEASE_TABLET | Freq: Every day | ORAL | Status: DC | PRN

## 2018-03-13 MED ORDER — ALPRAZOLAM 0.5 MG PO TABS
0.5000 mg | ORAL_TABLET | Freq: Two times a day (BID) | ORAL | Status: DC | PRN
  Administered 2018-03-14 – 2018-03-15 (×2): 0.5 mg via ORAL
  Filled 2018-03-13 (×2): qty 1

## 2018-03-13 MED ORDER — KETOROLAC TROMETHAMINE 30 MG/ML IJ SOLN
INTRAMUSCULAR | Status: AC
  Filled 2018-03-13: qty 1

## 2018-03-13 MED ORDER — ONDANSETRON HCL 4 MG PO TABS: 4.0000 mg | ORAL_TABLET | Freq: Four times a day (QID) | ORAL | Status: DC | PRN

## 2018-03-13 MED ORDER — KETOROLAC TROMETHAMINE 30 MG/ML IJ SOLN
INTRAMUSCULAR | Status: DC | PRN
  Administered 2018-03-13: 30 mg

## 2018-03-13 MED ORDER — ONDANSETRON HCL 4 MG/2ML IJ SOLN
4.0000 mg | Freq: Four times a day (QID) | INTRAMUSCULAR | Status: DC | PRN
  Administered 2018-03-14: 4 mg via INTRAVENOUS
  Filled 2018-03-13: qty 2

## 2018-03-13 MED ORDER — BUPIVACAINE-EPINEPHRINE 0.25% -1:200000 IJ SOLN
INTRAMUSCULAR | Status: DC | PRN
  Administered 2018-03-13: 30 mL

## 2018-03-13 MED ORDER — SIMVASTATIN 20 MG PO TABS: 20.0000 mg | ORAL_TABLET | Freq: Every day | ORAL | Status: DC

## 2018-03-13 MED ORDER — ACETAMINOPHEN 500 MG PO TABS
1000.0000 mg | ORAL_TABLET | Freq: Four times a day (QID) | ORAL | Status: AC
  Administered 2018-03-13 – 2018-03-14 (×3): 1000 mg via ORAL
  Filled 2018-03-13 (×3): qty 2

## 2018-03-13 MED ORDER — SODIUM CHLORIDE 0.9 % IJ SOLN
INTRAMUSCULAR | Status: DC | PRN
  Administered 2018-03-13: 29 mL

## 2018-03-13 MED ORDER — BUPIVACAINE HCL (PF) 0.75 % IJ SOLN
INTRAMUSCULAR | Status: DC | PRN
  Administered 2018-03-13: 30 mL via PERINEURAL

## 2018-03-13 MED ORDER — DEXAMETHASONE SODIUM PHOSPHATE 10 MG/ML IJ SOLN
INTRAMUSCULAR | Status: AC
  Filled 2018-03-13: qty 1

## 2018-03-13 MED ORDER — POLYVINYL ALCOHOL 1.4 % OP SOLN
1.0000 [drp] | Freq: Two times a day (BID) | OPHTHALMIC | Status: DC
  Administered 2018-03-13 – 2018-03-14 (×2): 1 [drp] via OPHTHALMIC
  Filled 2018-03-13: qty 15

## 2018-03-13 MED ORDER — SODIUM CHLORIDE 0.9 % IR SOLN
Status: DC | PRN
  Administered 2018-03-13: 2000 mL

## 2018-03-13 MED ORDER — LEVOTHYROXINE SODIUM 75 MCG PO TABS
75.0000 ug | ORAL_TABLET | Freq: Every day | ORAL | Status: DC
  Administered 2018-03-14 – 2018-03-15 (×2): 75 ug via ORAL
  Filled 2018-03-13 (×2): qty 1

## 2018-03-13 MED ORDER — MAGNESIUM CITRATE PO SOLN: 1.0000 | Freq: Once | ORAL | Status: DC | PRN

## 2018-03-13 MED ORDER — ONDANSETRON HCL 4 MG/2ML IJ SOLN
INTRAMUSCULAR | Status: AC
  Filled 2018-03-13: qty 2

## 2018-03-13 MED ORDER — DIPHENHYDRAMINE HCL 12.5 MG/5ML PO ELIX: 12.5000 mg | ORAL_SOLUTION | ORAL | Status: DC | PRN

## 2018-03-13 MED ORDER — BUPIVACAINE-EPINEPHRINE (PF) 0.25% -1:200000 IJ SOLN
INTRAMUSCULAR | Status: AC
  Filled 2018-03-13: qty 30

## 2018-03-13 MED ORDER — LETROZOLE 2.5 MG PO TABS
2.5000 mg | ORAL_TABLET | Freq: Every day | ORAL | Status: DC
  Administered 2018-03-14 – 2018-03-15 (×2): 2.5 mg via ORAL
  Filled 2018-03-13 (×2): qty 1

## 2018-03-13 MED ORDER — CHLORHEXIDINE GLUCONATE 4 % EX LIQD
60.0000 mL | Freq: Once | CUTANEOUS | Status: AC
  Administered 2018-03-13: 4 via TOPICAL

## 2018-03-13 MED ORDER — BUPIVACAINE IN DEXTROSE 0.75-8.25 % IT SOLN
INTRATHECAL | Status: DC | PRN
  Administered 2018-03-13: 1.6 mL via INTRATHECAL

## 2018-03-13 MED ORDER — PANTOPRAZOLE SODIUM 40 MG PO TBEC
40.0000 mg | DELAYED_RELEASE_TABLET | Freq: Every day | ORAL | Status: DC
  Administered 2018-03-14 – 2018-03-15 (×2): 40 mg via ORAL
  Filled 2018-03-13 (×2): qty 1

## 2018-03-13 MED ORDER — PROPOFOL 10 MG/ML IV BOLUS
INTRAVENOUS | Status: AC
  Filled 2018-03-13: qty 40

## 2018-03-13 MED ORDER — DOCUSATE SODIUM 100 MG PO CAPS
100.0000 mg | ORAL_CAPSULE | Freq: Two times a day (BID) | ORAL | Status: DC
  Administered 2018-03-13 – 2018-03-15 (×4): 100 mg via ORAL
  Filled 2018-03-13 (×4): qty 1

## 2018-03-13 MED ORDER — OXYCODONE HCL 5 MG PO TABS
5.0000 mg | ORAL_TABLET | ORAL | Status: DC | PRN
  Administered 2018-03-13: 10 mg via ORAL
  Administered 2018-03-13 – 2018-03-15 (×5): 5 mg via ORAL
  Filled 2018-03-13 (×2): qty 1
  Filled 2018-03-13: qty 2
  Filled 2018-03-13 (×3): qty 1

## 2018-03-13 MED ORDER — MIDAZOLAM HCL 2 MG/2ML IJ SOLN
2.0000 mg | INTRAMUSCULAR | Status: AC | PRN
  Administered 2018-03-13: 2 mg via INTRAVENOUS
  Filled 2018-03-13: qty 2

## 2018-03-13 MED ORDER — LACTATED RINGERS IV SOLN
INTRAVENOUS | Status: DC
  Administered 2018-03-13 (×2): via INTRAVENOUS

## 2018-03-13 MED ORDER — CEFAZOLIN SODIUM-DEXTROSE 2-4 GM/100ML-% IV SOLN
2.0000 g | INTRAVENOUS | Status: AC
  Administered 2018-03-13: 2 g via INTRAVENOUS
  Filled 2018-03-13: qty 100

## 2018-03-13 MED ORDER — ONDANSETRON HCL 4 MG/2ML IJ SOLN
INTRAMUSCULAR | Status: DC | PRN
  Administered 2018-03-13: 4 mg via INTRAVENOUS

## 2018-03-13 MED ORDER — ATORVASTATIN CALCIUM 10 MG PO TABS
10.0000 mg | ORAL_TABLET | Freq: Every day | ORAL | Status: DC
  Filled 2018-03-13 (×2): qty 1

## 2018-03-13 MED ORDER — CEFAZOLIN SODIUM-DEXTROSE 2-4 GM/100ML-% IV SOLN
2.0000 g | Freq: Four times a day (QID) | INTRAVENOUS | Status: AC
  Administered 2018-03-13 – 2018-03-14 (×2): 2 g via INTRAVENOUS
  Filled 2018-03-13 (×2): qty 100

## 2018-03-13 MED ORDER — LIDOCAINE 2% (20 MG/ML) 5 ML SYRINGE
INTRAMUSCULAR | Status: DC | PRN
  Administered 2018-03-13: 100 mg via INTRAVENOUS
  Administered 2018-03-13: 50 mg via INTRAVENOUS

## 2018-03-13 MED ORDER — FERROUS SULFATE 325 (65 FE) MG PO TABS
325.0000 mg | ORAL_TABLET | Freq: Three times a day (TID) | ORAL | Status: DC
  Administered 2018-03-14 – 2018-03-15 (×4): 325 mg via ORAL
  Filled 2018-03-13 (×4): qty 1

## 2018-03-13 MED ORDER — SODIUM CHLORIDE 0.9 % IV SOLN
INTRAVENOUS | Status: DC
  Administered 2018-03-13 – 2018-03-14 (×2): via INTRAVENOUS

## 2018-03-13 MED ORDER — STERILE WATER FOR IRRIGATION IR SOLN
Status: DC | PRN
  Administered 2018-03-13: 2000 mL

## 2018-03-13 MED ORDER — SODIUM CHLORIDE 0.9 % IJ SOLN
INTRAMUSCULAR | Status: AC
  Filled 2018-03-13: qty 50

## 2018-03-13 MED ORDER — ALUM & MAG HYDROXIDE-SIMETH 200-200-20 MG/5ML PO SUSP
30.0000 mL | ORAL | Status: DC | PRN
Start: 2018-03-13 — End: 2018-03-15
  Administered 2018-03-14: 30 mL via ORAL
  Filled 2018-03-13: qty 30

## 2018-03-13 MED ORDER — ENOXAPARIN SODIUM 30 MG/0.3ML ~~LOC~~ SOLN
30.0000 mg | Freq: Two times a day (BID) | SUBCUTANEOUS | Status: DC
  Administered 2018-03-14 – 2018-03-15 (×3): 30 mg via SUBCUTANEOUS
  Filled 2018-03-13 (×3): qty 0.3

## 2018-03-13 MED ORDER — METOCLOPRAMIDE HCL 5 MG/ML IJ SOLN: 5.0000 mg | Freq: Three times a day (TID) | INTRAMUSCULAR | Status: DC | PRN

## 2018-03-13 SURGICAL SUPPLY — 72 items
ADH SKN CLS APL DERMABOND .7 (GAUZE/BANDAGES/DRESSINGS) ×1
BAG SPEC THK2 15X12 ZIP CLS (MISCELLANEOUS) ×2
BAG ZIPLOCK 12X15 (MISCELLANEOUS) ×6 IMPLANT
BANDAGE ACE 4X5 VEL STRL LF (GAUZE/BANDAGES/DRESSINGS) ×3 IMPLANT
BANDAGE ACE 6X5 VEL STRL LF (GAUZE/BANDAGES/DRESSINGS) ×3 IMPLANT
BLADE SAG 18X100X1.27 (BLADE) ×3 IMPLANT
BLADE SAW SGTL 13.0X1.19X90.0M (BLADE) ×3 IMPLANT
BOWL SMART MIX CTS (DISPOSABLE) ×3 IMPLANT
CAP KNEE TOTAL 3 SIGMA ×3 IMPLANT
CEMENT HV SMART SET (Cement) ×4 IMPLANT
COVER SURGICAL LIGHT HANDLE (MISCELLANEOUS) ×3 IMPLANT
CUFF TOURN SGL QUICK 34 (TOURNIQUET CUFF) ×3
CUFF TRNQT CYL 34X4X40X1 (TOURNIQUET CUFF) ×1 IMPLANT
DECANTER SPIKE VIAL GLASS SM (MISCELLANEOUS) ×3 IMPLANT
DERMABOND ADVANCED (GAUZE/BANDAGES/DRESSINGS) ×2
DERMABOND ADVANCED .7 DNX12 (GAUZE/BANDAGES/DRESSINGS) ×1 IMPLANT
DRAPE U-SHAPE 47X51 STRL (DRAPES) ×3 IMPLANT
DRESSING AQUACEL AG SP 3.5X10 (GAUZE/BANDAGES/DRESSINGS) IMPLANT
DRSG AQUACEL AG ADV 3.5X10 (GAUZE/BANDAGES/DRESSINGS) ×3 IMPLANT
DRSG AQUACEL AG SP 3.5X10 (GAUZE/BANDAGES/DRESSINGS) ×3
DRSG TEGADERM 4X4.75 (GAUZE/BANDAGES/DRESSINGS) ×3 IMPLANT
DURAPREP 26ML APPLICATOR (WOUND CARE) ×6 IMPLANT
ELECT REM PT RETURN 15FT ADLT (MISCELLANEOUS) ×3 IMPLANT
EVACUATOR 1/8 PVC DRAIN (DRAIN) ×3 IMPLANT
GAUZE SPONGE 2X2 8PLY STRL LF (GAUZE/BANDAGES/DRESSINGS) ×1 IMPLANT
GLOVE BIO SURGEON STRL SZ 6.5 (GLOVE) ×2 IMPLANT
GLOVE BIO SURGEON STRL SZ7.5 (GLOVE) ×6 IMPLANT
GLOVE BIO SURGEONS STRL SZ 6.5 (GLOVE) ×1
GLOVE BIOGEL PI IND STRL 6.5 (GLOVE) ×1 IMPLANT
GLOVE BIOGEL PI IND STRL 7.0 (GLOVE) IMPLANT
GLOVE BIOGEL PI IND STRL 7.5 (GLOVE) IMPLANT
GLOVE BIOGEL PI IND STRL 8 (GLOVE) ×2 IMPLANT
GLOVE BIOGEL PI INDICATOR 6.5 (GLOVE) ×2
GLOVE BIOGEL PI INDICATOR 7.0 (GLOVE) ×4
GLOVE BIOGEL PI INDICATOR 7.5 (GLOVE) ×8
GLOVE BIOGEL PI INDICATOR 8 (GLOVE) ×4
GLOVE ECLIPSE 8.0 STRL XLNG CF (GLOVE) ×6 IMPLANT
GLOVE SURG ORTHO 9.0 STRL STRW (GLOVE) ×3 IMPLANT
GLOVE SURG SS PI 7.5 STRL IVOR (GLOVE) ×3 IMPLANT
GOWN STRL REUS W/ TWL LRG LVL3 (GOWN DISPOSABLE) ×2 IMPLANT
GOWN STRL REUS W/TWL LRG LVL3 (GOWN DISPOSABLE) ×6
GOWN STRL REUS W/TWL XL LVL3 (GOWN DISPOSABLE) ×9 IMPLANT
HANDPIECE INTERPULSE COAX TIP (DISPOSABLE) ×3
HOLDER FOLEY CATH W/STRAP (MISCELLANEOUS) ×3 IMPLANT
IMMOBILIZER KNEE 20 (SOFTGOODS) ×3
IMMOBILIZER KNEE 20 THIGH 36 (SOFTGOODS) ×1 IMPLANT
NDL SAFETY ECLIPSE 18X1.5 (NEEDLE) ×1 IMPLANT
NEEDLE HYPO 18GX1.5 SHARP (NEEDLE) ×3
PACK TOTAL KNEE CUSTOM (KITS) ×3 IMPLANT
POSITIONER SURGICAL ARM (MISCELLANEOUS) ×3 IMPLANT
SET HNDPC FAN SPRY TIP SCT (DISPOSABLE) ×1 IMPLANT
SET PAD KNEE POSITIONER (MISCELLANEOUS) ×3 IMPLANT
SPONGE GAUZE 2X2 STER 10/PKG (GAUZE/BANDAGES/DRESSINGS) ×2
SPONGE LAP 18X18 RF (DISPOSABLE) IMPLANT
SPONGE SURGIFOAM ABS GEL 100 (HEMOSTASIS) ×3 IMPLANT
STOCKINETTE 6  STRL (DRAPES) ×2
STOCKINETTE 6 STRL (DRAPES) ×1 IMPLANT
SUCTION FRAZIER HANDLE 12FR (TUBING) ×2
SUCTION TUBE FRAZIER 12FR DISP (TUBING) ×1 IMPLANT
SUT BONE WAX W31G (SUTURE) IMPLANT
SUT MNCRL AB 3-0 PS2 18 (SUTURE) ×3 IMPLANT
SUT VIC AB 1 CT1 27 (SUTURE) ×12
SUT VIC AB 1 CT1 27XBRD ANTBC (SUTURE) ×4 IMPLANT
SUT VIC AB 2-0 CT1 27 (SUTURE) ×6
SUT VIC AB 2-0 CT1 TAPERPNT 27 (SUTURE) ×2 IMPLANT
SUT VLOC 180 0 24IN GS25 (SUTURE) ×3 IMPLANT
SYR 3ML LL SCALE MARK (SYRINGE) ×3 IMPLANT
SYR 50ML LL SCALE MARK (SYRINGE) ×3 IMPLANT
TAPE STRIPS DRAPE STRL (GAUZE/BANDAGES/DRESSINGS) ×3 IMPLANT
TRAY FOLEY CATH 14FR (SET/KITS/TRAYS/PACK) ×2 IMPLANT
WRAP KNEE MAXI GEL POST OP (GAUZE/BANDAGES/DRESSINGS) ×3 IMPLANT
YANKAUER SUCT BULB TIP 10FT TU (MISCELLANEOUS) ×3 IMPLANT

## 2018-03-13 NOTE — Anesthesia Preprocedure Evaluation (Addendum)
Anesthesia Evaluation  Patient identified by MRN, date of birth, ID band Patient awake    Reviewed: Allergy & Precautions, H&P , NPO status , Patient's Chart, lab work & pertinent test results  Airway Mallampati: II  TM Distance: >3 FB Neck ROM: Full   Comment: VERY SMALL MOUTH Dental no notable dental hx. (+) Upper Dentures, Dental Advisory Given   Pulmonary asthma ,    Pulmonary exam normal breath sounds clear to auscultation       Cardiovascular hypertension, Pt. on medications  Rhythm:Regular Rate:Normal     Neuro/Psych Anxiety negative neurological ROS  negative psych ROS   GI/Hepatic Neg liver ROS, GERD  Medicated and Controlled,  Endo/Other  Hypothyroidism   Renal/GU negative Renal ROS  negative genitourinary   Musculoskeletal   Abdominal   Peds  Hematology negative hematology ROS (+)   Anesthesia Other Findings   Reproductive/Obstetrics negative OB ROS                            Anesthesia Physical  Anesthesia Plan  ASA: II  Anesthesia Plan: Spinal   Post-op Pain Management:  Regional for Post-op pain   Induction: Intravenous  PONV Risk Score and Plan: 2 and Ondansetron and Treatment may vary due to age or medical condition  Airway Management Planned: Natural Airway, Mask and Nasal Cannula  Additional Equipment:   Intra-op Plan:   Post-operative Plan:   Informed Consent: I have reviewed the patients History and Physical, chart, labs and discussed the procedure including the risks, benefits and alternatives for the proposed anesthesia with the patient or authorized representative who has indicated his/her understanding and acceptance.   Dental advisory given  Plan Discussed with: CRNA, Anesthesiologist and Surgeon  Anesthesia Plan Comments: (  )        Anesthesia Quick Evaluation

## 2018-03-13 NOTE — Anesthesia Procedure Notes (Signed)
Anesthesia Regional Block: Adductor canal block   Pre-Anesthetic Checklist: ,, timeout performed, Correct Patient, Correct Site, Correct Laterality, Correct Procedure, Correct Position, site marked, Risks and benefits discussed,  Surgical consent,  Pre-op evaluation,  At surgeon's request and post-op pain management  Laterality: Right  Prep: chloraprep       Needles:  Injection technique: Single-shot  Needle Type: Echogenic Stimulator Needle     Needle Length: 5cm  Needle Gauge: 22     Additional Needles:   Procedures:, nerve stimulator,,, ultrasound used (permanent image in chart),,,,  Narrative:  Start time: 03/13/2018 1:00 PM End time: 03/13/2018 1:05 PM Injection made incrementally with aspirations every 5 mL.  Performed by: Personally  Anesthesiologist: Janeece Riggers, MD  Additional Notes: Functioning IV was confirmed and monitors were applied.  A 60mm 22ga Arrow echogenic stimulator needle was used. Sterile prep and drape,hand hygiene and sterile gloves were used. Ultrasound guidance: relevant anatomy identified, needle position confirmed, local anesthetic spread visualized around nerve(s)., vascular puncture avoided.  Image printed for medical record. Negative aspiration and negative test dose prior to incremental administration of local anesthetic. The patient tolerated the procedure well.

## 2018-03-13 NOTE — Progress Notes (Signed)
Assisted Dr. Ambrose Pancoast with right, ultrasound guided, adductor canal block. Side rails up, monitors on throughout procedure. See vital signs in flow sheet. Tolerated Procedure well.

## 2018-03-13 NOTE — Anesthesia Procedure Notes (Signed)
Spinal  Patient location during procedure: OR End time: 03/13/2018 1:33 PM Staffing Resident/CRNA: Noralyn Pick D, CRNA Performed: anesthesiologist and resident/CRNA  Preanesthetic Checklist Completed: patient identified, site marked, surgical consent, pre-op evaluation, timeout performed, IV checked, risks and benefits discussed and monitors and equipment checked Spinal Block Patient position: sitting Prep: Betadine Patient monitoring: heart rate, continuous pulse ox and blood pressure Approach: midline Location: L2-3 Injection technique: single-shot Needle Needle type: Sprotte  Needle gauge: 24 G Needle length: 9 cm Assessment Sensory level: T6 Additional Notes Expiration date of kit checked and confirmed. Patient tolerated procedure well, without complications.

## 2018-03-13 NOTE — Plan of Care (Signed)
Reviewed plan of care, specifically pain control, safety precautions, IS use, and importance of notifying RN with any questions or concerns. Pt attentive and verbalized understanding of all education.

## 2018-03-13 NOTE — Op Note (Signed)
DATE OF SURGERY:  03/13/2018  TIME: 3:04 PM  PATIENT NAME:  Tara Rodgers    AGE: 72 y.o.   PRE-OPERATIVE DIAGNOSIS:  Right knee osteoarthritis  POST-OPERATIVE DIAGNOSIS:  Right knee osteoarthritis  PROCEDURE:  Procedure(s): RIGHT TOTAL KNEE ARTHROPLASTY  SURGEON:  Azelie Noguera ANDREW  ASSISTANT:  Bryson Stilwell, PA-C, present and scrubbed throughout the case, critical for assistance with exposure, retraction, instrumentation, and closure.  OPERATIVE IMPLANTS: Depuy PFC Sigma Rotating Platform.  Femur size 3, Tibia size 2, Patella size 32 3-peg oval button, with a 10 mm polyethylene insert.   PREOPERATIVE INDICATIONS:   Tara Rodgers is a 72 y.o. year old female with end stage bone on bone arthritis of the knee who failed conservative treatment and elected for Total Knee Arthroplasty.   The risks, benefits, and alternatives were discussed at length including but not limited to the risks of infection, bleeding, nerve injury, stiffness, blood clots, the need for revision surgery, cardiopulmonary complications, among others, and they were willing to proceed.  OPERATIVE DESCRIPTION:  The patient was brought to the operative room and placed in a supine position.  Spinal anesthesia was administered.  IV antibiotics were given.  The lower extremity was prepped and draped in the usual sterile fashion.  Time out was performed.  The leg was elevated and exsanguinated and the tourniquet was inflated.  Anterior quadriceps tendon splitting approach was performed.  The patella was retracted and osteophytes were removed.  The anterior horn of the medial and lateral meniscus was removed and cruciate ligaments resected.   The distal femur was opened with the drill and the intramedullary distal femoral cutting jig was utilized, set at 5 degrees resecting 10 mm off the distal femur.  Care was taken to protect the collateral ligaments.  The distal femoral sizing jig was applied, taking care to  avoid notching.  Then the 4-in-1 cutting jig was applied and the anterior and posterior femur was cut, along with the chamfer cuts.    Then the extramedullary tibial cutting jig was utilized making the appropriate cut using the anterior tibial crest as a reference building in appropriate posterior slope.  Care was taken during the cut to protect the medial and collateral ligaments.  The proximal tibia was removed along with the posterior horns of the menisci.   The posterior medial femoral osteophytes and posterior lateral femoral osteophytes were removed.    The flexion gap was then measured and was symmetric with the extension gap, measured at 10.  I completed the distal femoral preparation using the appropriate jig to prepare the box.  The patella was then measured, and cut with the saw.    The proximal tibia sized and prepared accordingly with the reamer and the punch, and then all components were trialed with the trial insert.  The knee was found to have excellent balance and full motion.    The above named components were then cemented into place and all excess cement was removed.  The trial polyethylene component was in place during cementation, and then was exchanged for the real polyethylene component.    The knee was easily taken through a range of motion and the patella tracked well and the knee irrigated copiously and the parapatellar and subcutaneous tissue closed with vicryl, and monocryl with steri strips for the skin.  The arthrotomy was closed at 90 of flexion. The wounds were dressed with sterile gauze and the tourniquet released and the patient was awakened and returned to the PACU  in stable and satisfactory condition.  There were no complications.  Total tourniquet time was 65 minutes.

## 2018-03-13 NOTE — Transfer of Care (Signed)
Immediate Anesthesia Transfer of Care Note  Patient: Tara Rodgers  Procedure(s) Performed: RIGHT TOTAL KNEE ARTHROPLASTY (Right Knee)  Patient Location: PACU  Anesthesia Type:Regional and Spinal  Level of Consciousness: awake, alert  and oriented  Airway & Oxygen Therapy: Patient Spontanous Breathing and Patient connected to face mask oxygen  Post-op Assessment: Report given to RN and Post -op Vital signs reviewed and stable  Post vital signs: Reviewed and stable  Last Vitals:  Vitals Value Taken Time  BP 98/51 03/13/2018  3:25 PM  Temp    Pulse 113 03/13/2018  3:29 PM  Resp 24 03/13/2018  3:29 PM  SpO2 94 % 03/13/2018  3:29 PM  Vitals shown include unvalidated device data.  Last Pain:  Vitals:   03/13/18 1147  TempSrc:   PainSc: 0-No pain         Complications: No apparent anesthesia complications

## 2018-03-13 NOTE — Interval H&P Note (Signed)
History and Physical Interval Note:  03/13/2018 1:18 PM  Tara Rodgers  has presented today for surgery, with the diagnosis of Right knee osteoarthritis  The various methods of treatment have been discussed with the patient and family. After consideration of risks, benefits and other options for treatment, the patient has consented to  Procedure(s): RIGHT TOTAL KNEE ARTHROPLASTY (Right) as a surgical intervention .  The patient's history has been reviewed, patient examined, no change in status, stable for surgery.  I have reviewed the patient's chart and labs.  Questions were answered to the patient's satisfaction.     Jotham Ahn ANDREW

## 2018-03-13 NOTE — Plan of Care (Signed)
Plan of care 

## 2018-03-14 LAB — CBC
HCT: 32.8 % — ABNORMAL LOW (ref 36.0–46.0)
HEMOGLOBIN: 10.6 g/dL — AB (ref 12.0–15.0)
MCH: 28.6 pg (ref 26.0–34.0)
MCHC: 32.3 g/dL (ref 30.0–36.0)
MCV: 88.6 fL (ref 78.0–100.0)
Platelets: 238 10*3/uL (ref 150–400)
RBC: 3.7 MIL/uL — AB (ref 3.87–5.11)
RDW: 13.2 % (ref 11.5–15.5)
WBC: 12.6 10*3/uL — ABNORMAL HIGH (ref 4.0–10.5)

## 2018-03-14 LAB — BASIC METABOLIC PANEL
Anion gap: 12 (ref 5–15)
BUN: 12 mg/dL (ref 6–20)
CHLORIDE: 107 mmol/L (ref 101–111)
CO2: 20 mmol/L — ABNORMAL LOW (ref 22–32)
Calcium: 8.3 mg/dL — ABNORMAL LOW (ref 8.9–10.3)
Creatinine, Ser: 1.08 mg/dL — ABNORMAL HIGH (ref 0.44–1.00)
GFR calc Af Amer: 59 mL/min — ABNORMAL LOW (ref >60)
GFR calc non Af Amer: 51 mL/min — ABNORMAL LOW (ref >60)
GLUCOSE: 150 mg/dL — AB (ref 65–99)
POTASSIUM: 4.2 mmol/L (ref 3.5–5.1)
Sodium: 139 mmol/L (ref 135–145)

## 2018-03-14 NOTE — Progress Notes (Signed)
   03/14/18 1700  PT Visit Information  Assistance Needed +1  History of Present Illness s/p R TKA  Performed TKA HEP, pt tol well; AAROM right knee flexion ~ 6* to 60*  Subjective Data  Patient Stated Goal less knee  pain  Precautions  Precautions Knee  Precaution Comments independent SLR today, KI not used this session, reviewed knee precautions  Restrictions  Other Position/Activity Restrictions WBAT  Pain Assessment  Pain Location right knee   Pain Descriptors / Indicators Discomfort;Sore  Cognition  Arousal/Alertness Awake/alert  Behavior During Therapy WFL for tasks assessed/performed  Overall Cognitive Status Within Functional Limits for tasks assessed  Total Joint Exercises  Ankle Circles/Pumps AROM;Both;10 reps  Quad Sets AROM;Both;10 reps  Towel Squeeze AROM;Both;10 reps  Heel Slides AAROM;Right;10 reps  Hip ABduction/ADduction AAROM;AROM;Right;10 reps  Straight Leg Raises AROM;Right;AAROM;10 reps;Limitations  Straight Leg Raises Limitations light assist provided d/t pain  PT - End of Session  Activity Tolerance Patient tolerated treatment well  Patient left with call bell/phone within reach;in bed;with bed alarm set  Nurse Communication Mobility status   PT - Assessment/Plan  PT Plan Current plan remains appropriate  PT Visit Diagnosis Difficulty in walking, not elsewhere classified (R26.2)  PT Frequency (ACUTE ONLY) 7X/week  Follow Up Recommendations Follow surgeon's recommendation for DC plan and follow-up therapies  PT equipment Rolling walker with 5" wheels  AM-PAC PT "6 Clicks" Daily Activity Outcome Measure  Difficulty turning over in bed (including adjusting bedclothes, sheets and blankets)? 3  Difficulty moving from lying on back to sitting on the side of the bed?  3  Difficulty sitting down on and standing up from a chair with arms (e.g., wheelchair, bedside commode, etc,.)? 1  Help needed moving to and from a bed to chair (including a wheelchair)? 3   Help needed walking in hospital room? 3  Help needed climbing 3-5 steps with a railing?  3  6 Click Score 16  Mobility G Code  CK  PT Goal Progression  Progress towards PT goals Progressing toward goals  Acute Rehab PT Goals  PT Goal Formulation With patient  Time For Goal Achievement 03/21/18  Potential to Achieve Goals Good  PT Time Calculation  PT Start Time (ACUTE ONLY) 1644  PT Stop Time (ACUTE ONLY) 1701  PT Time Calculation (min) (ACUTE ONLY) 17 min  PT General Charges  $$ ACUTE PT VISIT 1 Visit  PT Treatments  $Therapeutic Exercise 8-22 mins

## 2018-03-14 NOTE — Progress Notes (Signed)
    Subjective: 1 Day Post-Op Procedure(s) (LRB): RIGHT TOTAL KNEE ARTHROPLASTY (Right) Patient reports pain as 1 on 0-10 scale.  Reports knee is still numb from the block Denies CP or SOB.  Voiding without difficulty. Positive flatus. Objective: Vital signs in last 24 hours: Temp:  [97.3 F (36.3 C)-98.4 F (36.9 C)] 97.5 F (36.4 C) (06/01 0605) Pulse Rate:  [80-112] 86 (06/01 0605) Resp:  [11-23] 17 (06/01 0605) BP: (98-140)/(47-91) 118/70 (06/01 0605) SpO2:  [94 %-100 %] 100 % (06/01 0605) Weight:  [59.9 kg (132 lb)] 59.9 kg (132 lb) (05/31 1730)  Intake/Output from previous day: 05/31 0701 - 06/01 0700 In: 3692.5 [P.O.:800; I.V.:2637.5; IV Piggyback:255] Out: 0814 [Urine:1550; Drains:52; Blood:50] Intake/Output this shift: No intake/output data recorded.  Labs: Recent Labs    03/14/18 0547  HGB 10.6*   Recent Labs    03/14/18 0547  WBC 12.6*  RBC 3.70*  HCT 32.8*  PLT 238   Recent Labs    03/14/18 0547  NA 139  K 4.2  CL 107  CO2 20*  BUN 12  CREATININE 1.08*  GLUCOSE 150*  CALCIUM 8.3*   No results for input(s): LABPT, INR in the last 72 hours.  Physical Exam: Neurologically intact ABD soft Sensation intact distally Dorsiflexion/Plantar flexion intact Incision: brace, ice, ace wrap in place, pulled drain Compartment soft Body mass index is 26.66 kg/m.   Assessment/Plan: 1 Day Post-Op Procedure(s) (LRB): RIGHT TOTAL KNEE ARTHROPLASTY (Right) Advance diet Up with therapy  Plan for DC tomorrow  [place], Darla Lesches for Dr. Melina Schools [facility] [phone] 03/14/2018, 8:03 AM

## 2018-03-14 NOTE — Evaluation (Signed)
Physical Therapy Evaluation Patient Details Name: Tara Rodgers MRN: [id number] DOB: [birth year] Today's Date: 03/14/2018   History of Present Illness  s/p R TKA  Clinical Impression  Pt is s/p TKA resulting in the deficits listed below (see PT Problem List). Pt should progress well, she is hopeful to d/c tomorow Pt will benefit from skilled PT to increase their independence and safety with mobility to allow discharge to the venue listed below.      Follow Up Recommendations Follow surgeon's recommendation for DC plan and follow-up therapies    Equipment Recommendations  Rolling walker with 5" wheels(petite/short RW)    Recommendations for Other Services       Precautions / Restrictions Precautions Precautions: Knee Precaution Comments: independent SLR today, KI not used this session Required Braces or Orthoses: Knee Immobilizer - Right Restrictions Weight Bearing Restrictions: No Other Position/Activity Restrictions: WBAT      Mobility  Bed Mobility Overal bed mobility: Needs Assistance Bed Mobility: Supine to Sit     Supine to sit: Supervision     General bed mobility comments: for safety  Transfers Overall transfer level: Needs assistance Equipment used: Rolling walker (2 wheeled) Transfers: Sit to/from Stand Sit to Stand: Min guard         General transfer comment: cues for hand placement  Ambulation/Gait Ambulation/Gait assistance: Min assist;Min guard Ambulation Distance (Feet): 120 Feet Assistive device: Rolling walker (2 wheeled) Gait Pattern/deviations: Step-to pattern;Step-through pattern     General Gait Details: cues for sequence, RW position  Stairs            Wheelchair Mobility    Modified Rankin (Stroke Patients Only)       Balance                                             Pertinent Vitals/Pain Pain Assessment: 0-10 Pain Score: 3  Pain Location: right knee  Pain Descriptors / Indicators:  Discomfort;Sore Pain Intervention(s): Limited activity within patient's tolerance;Monitored during session;Premedicated before session;Repositioned;Ice applied    Home Living Family/patient expects to be discharged to:: Private residence Living Arrangements: Spouse/significant other(husband works during day) Available Help at Discharge: Family Type of Home: House Home Access: Stairs to enter   Technical brewer of Steps: 1 Home Layout: One level Home Equipment: None      Prior Function Level of Independence: Independent               Hand Dominance        Extremity/Trunk Assessment   Upper Extremity Assessment Upper Extremity Assessment: Defer to OT evaluation;Overall WFL for tasks assessed    Lower Extremity Assessment Lower Extremity Assessment: RLE deficits/detail RLE Deficits / Details: ankle WFL; knee extension and hip flexion 2+/5; knee AAROM 5* to 65* flexion       Communication   Communication: No difficulties  Cognition Arousal/Alertness: Awake/alert Behavior During Therapy: WFL for tasks assessed/performed Overall Cognitive Status: Within Functional Limits for tasks assessed                                        General Comments      Exercises Total Joint Exercises Ankle Circles/Pumps: AROM;Both;10 reps   Assessment/Plan    PT Assessment Patient needs continued PT services  PT Problem List Decreased strength;Decreased  range of motion;Decreased activity tolerance;Decreased knowledge of use of DME;Decreased mobility;Pain       PT Treatment Interventions DME instruction;Gait training;Functional mobility training;Therapeutic activities;Therapeutic exercise;Patient/family education    PT Goals (Current goals can be found in the Care Plan section)  Acute Rehab PT Goals Patient Stated Goal: less knee  pain PT Goal Formulation: With patient Time For Goal Achievement: 03/21/18 Potential to Achieve Goals: Good    Frequency  7X/week   Barriers to discharge        Co-evaluation               AM-PAC PT "6 Clicks" Daily Activity  Outcome Measure Difficulty turning over in bed (including adjusting bedclothes, sheets and blankets)?: A Little Difficulty moving from lying on back to sitting on the side of the bed? : A Little Difficulty sitting down on and standing up from a chair with arms (e.g., wheelchair, bedside commode, etc,.)?: Unable Help needed moving to and from a bed to chair (including a wheelchair)?: A Little Help needed walking in hospital room?: A Little Help needed climbing 3-5 steps with a railing? : A Little 6 Click Score: 16    End of Session Equipment Utilized During Treatment: Gait belt Activity Tolerance: Patient tolerated treatment well Patient left: in chair;with call bell/phone within reach Nurse Communication: Mobility status PT Visit Diagnosis: Difficulty in walking, not elsewhere classified (R26.2)    Time: 0712-1975 PT Time Calculation (min) (ACUTE ONLY): 20 min   Charges:   PT Evaluation $PT Eval Low Complexity: 1 Low     PT G CodesKenyon Ana, PT Pager: [phone] 03/14/2018   Kenyon Ana 03/14/2018, 12:02 PM

## 2018-03-14 NOTE — Care Management Note (Signed)
Case Management Note  Patient Details  Name: ALLURE GREASER MRN: [id number] Date of Birth: Apr 30, 1946  Subjective/Objective:    S/p Right TKA                Action/Plan: NCM spoke to pt at bedside. Offered choice for HH/list provided. Pt agreeable to [facility] for home (preoperatively arrange at surgeon's office). Pt requesting youth RW and 3n1 bc. States husband at home to assist with care.     Expected Discharge Date:  03/13/18               Expected Discharge Plan:  [place]  In-House Referral:  NA  Discharge planning Services  CM Consult  Post Acute Care Choice:  Home Health Choice offered to:  Patient  DME Arranged:  3-N-1, Walker rolling DME Agency:  [place]:  PT [place] Agency:  [facility]  Status of Service:  Completed, signed off  If discussed at [employer], dates discussed:    Additional Comments:  Erenest Rasher, RN 03/14/2018, 12:43 PM

## 2018-03-14 NOTE — Progress Notes (Signed)
Physical Therapy Treatment Patient Details Name: CONSUELA WIDENER MRN: [id number] DOB: October 25, 1945 Today's Date: 03/14/2018    History of Present Illness s/p R TKA    PT Comments    Progressing toward PT goals; hopeful to d/c tomorrow  Follow Up Recommendations  Follow surgeon's recommendation for DC plan and follow-up therapies     Equipment Recommendations  Rolling walker with 5" wheels    Recommendations for Other Services       Precautions / Restrictions Precautions Precautions: Knee Precaution Comments: independent SLR today, KI not used this session, reviewed knee precautions Restrictions Other Position/Activity Restrictions: WBAT    Mobility  Bed Mobility Overal bed mobility: Needs Assistance Bed Mobility: Supine to Sit;Sit to Supine     Supine to sit: Supervision Sit to supine: Supervision   General bed mobility comments: for safety  Transfers Overall transfer level: Needs assistance Equipment used: Rolling walker (2 wheeled) Transfers: Sit to/from Stand Sit to Stand: Min guard         General transfer comment: cues for hand placement  Ambulation/Gait Ambulation/Gait assistance: Min guard;Supervision Ambulation Distance (Feet): 180 Feet Assistive device: Rolling walker (2 wheeled)       General Gait Details: cues for sequence, RW position   Stairs             Wheelchair Mobility    Modified Rankin (Stroke Patients Only)       Balance                                            Cognition Arousal/Alertness: Awake/alert Behavior During Therapy: WFL for tasks assessed/performed Overall Cognitive Status: Within Functional Limits for tasks assessed                                        Exercises Total Joint Exercises Ankle Circles/Pumps: AROM;Both;10 reps    General Comments        Pertinent Vitals/Pain Pain Assessment: 0-10 Pain Score: 3  Pain Location: right knee  Pain Descriptors /  Indicators: Discomfort;Sore Pain Intervention(s): Monitored during session;Limited activity within patient's tolerance    Home Living                      Prior Function            PT Goals (current goals can now be found in the care plan section) Acute Rehab PT Goals Patient Stated Goal: less knee  pain PT Goal Formulation: With patient Time For Goal Achievement: 03/21/18 Potential to Achieve Goals: Good Progress towards PT goals: Progressing toward goals    Frequency    7X/week      PT Plan Current plan remains appropriate    Co-evaluation              AM-PAC PT "6 Clicks" Daily Activity  Outcome Measure  Difficulty turning over in bed (including adjusting bedclothes, sheets and blankets)?: A Little Difficulty moving from lying on back to sitting on the side of the bed? : A Little Difficulty sitting down on and standing up from a chair with arms (e.g., wheelchair, bedside commode, etc,.)?: Unable Help needed moving to and from a bed to chair (including a wheelchair)?: A Little Help needed walking in hospital room?: A Little Help needed climbing 3-5  steps with a railing? : A Little 6 Click Score: 16    End of Session Equipment Utilized During Treatment: Gait belt Activity Tolerance: Patient tolerated treatment well Patient left: in chair;with call bell/phone within reach Nurse Communication: Mobility status PT Visit Diagnosis: Difficulty in walking, not elsewhere classified (R26.2)     Time: 5170-0174 PT Time Calculation (min) (ACUTE ONLY): 15 min  Charges:  $Gait Training: 8-22 mins                    G CodesKenyon Ana, PT Pager: [phone] 03/14/2018    [facility] 03/14/2018, 4:33 PM

## 2018-03-15 LAB — CBC
HCT: 30.6 % — ABNORMAL LOW (ref 36.0–46.0)
Hemoglobin: 9.9 g/dL — ABNORMAL LOW (ref 12.0–15.0)
MCH: 28.8 pg (ref 26.0–34.0)
MCHC: 32.4 g/dL (ref 30.0–36.0)
MCV: 89 fL (ref 78.0–100.0)
PLATELETS: 246 10*3/uL (ref 150–400)
RBC: 3.44 MIL/uL — ABNORMAL LOW (ref 3.87–5.11)
RDW: 13.5 % (ref 11.5–15.5)
WBC: 15.2 10*3/uL — AB (ref 4.0–10.5)

## 2018-03-15 NOTE — Anesthesia Postprocedure Evaluation (Signed)
Anesthesia Post Note  Patient: ORMA CHEETHAM  Procedure(s) Performed: RIGHT TOTAL KNEE ARTHROPLASTY (Right Knee)     Patient location during evaluation: PACU Anesthesia Type: Spinal Level of consciousness: oriented and awake and alert Pain management: pain level controlled Vital Signs Assessment: post-procedure vital signs reviewed and stable Respiratory status: spontaneous breathing, respiratory function stable and patient connected to nasal cannula oxygen Cardiovascular status: blood pressure returned to baseline and stable Postop Assessment: no headache, no backache and no apparent nausea or vomiting Anesthetic complications: no    Last Vitals:  Vitals:   03/14/18 2020 03/15/18 0611  BP: 107/64 124/74  Pulse: 91 93  Resp: 19 16  Temp: 36.7 C 36.7 C  SpO2: 97% 98%    Last Pain:  Vitals:   03/15/18 0755  TempSrc:   PainSc: 0-No pain                 Rajan Burgard

## 2018-03-15 NOTE — Progress Notes (Signed)
Patient ID: Tara Rodgers, female   DOB: 03-08-1946, 72 y.o.   MRN: [id number] Subjective: 2 Days Post-Op Procedure(s) (LRB): RIGHT TOTAL KNEE ARTHROPLASTY (Right) Patient reports pain as mild.    Patient has complaints of right knee pain, mild. No CP, SOB, fever, chills, numbness, tingling. Nausea from yesterday has resolved, she feels that was likely from receiving pain meds on an empty stomach early yesterday morning. Voiding without difficulty. No BM yet. Some flatus, no distension, no abdominal pain or pressure. Feels ready to go home.  Objective: Vital signs in last 24 hours: Temp:  [97.9 F (36.6 C)-98.2 F (36.8 C)] 98 F (36.7 C) (06/02 0611) Pulse Rate:  [80-93] 93 (06/02 0611) Resp:  [15-19] 16 (06/02 0611) BP: (107-124)/(64-74) 124/74 (06/02 0611) SpO2:  [97 %-100 %] 98 % (06/02 0611)  Intake/Output from previous day:  Intake/Output Summary (Last 24 hours) at 03/15/2018 0748 Last data filed at 03/15/2018 0600 Gross per 24 hour  Intake 540 ml  Output 250 ml  Net 290 ml    Intake/Output this shift: No intake/output data recorded.  Labs: Results for orders placed or performed during the hospital encounter of 03/13/18  CBC  Result Value Ref Range   WBC 12.6 (H) 4.0 - 10.5 K/uL   RBC 3.70 (L) 3.87 - 5.11 MIL/uL   Hemoglobin 10.6 (L) 12.0 - 15.0 g/dL   HCT 32.8 (L) 36.0 - 46.0 %   MCV 88.6 78.0 - 100.0 fL   MCH 28.6 26.0 - 34.0 pg   MCHC 32.3 30.0 - 36.0 g/dL   RDW 13.2 11.5 - 15.5 %   Platelets 238 150 - 400 K/uL  Basic metabolic panel  Result Value Ref Range   Sodium 139 135 - 145 mmol/L   Potassium 4.2 3.5 - 5.1 mmol/L   Chloride 107 101 - 111 mmol/L   CO2 20 (L) 22 - 32 mmol/L   Glucose, Bld 150 (H) 65 - 99 mg/dL   BUN 12 6 - 20 mg/dL   Creatinine, Ser 1.08 (H) 0.44 - 1.00 mg/dL   Calcium 8.3 (L) 8.9 - 10.3 mg/dL   GFR calc non Af Amer 51 (L) >60 mL/min   GFR calc Af Amer 59 (L) >60 mL/min   Anion gap 12 5 - 15  CBC  Result Value Ref Range   WBC 15.2  (H) 4.0 - 10.5 K/uL   RBC 3.44 (L) 3.87 - 5.11 MIL/uL   Hemoglobin 9.9 (L) 12.0 - 15.0 g/dL   HCT 30.6 (L) 36.0 - 46.0 %   MCV 89.0 78.0 - 100.0 fL   MCH 28.8 26.0 - 34.0 pg   MCHC 32.4 30.0 - 36.0 g/dL   RDW 13.5 11.5 - 15.5 %   Platelets 246 150 - 400 K/uL    Exam - Neurologically intact ABD soft Neurovascular intact Sensation intact distally Intact pulses distally Dorsiflexion/Plantar flexion intact Incision: dressing C/D/I and no drainage No cellulitis present Compartment soft no calf pain or sign of DVT Dressing/Incision - clean, dry, scant drainage distal aquacel Motor function intact - moving foot and toes well on exam.   Assessment/Plan: 2 Days Post-Op Procedure(s) (LRB): RIGHT TOTAL KNEE ARTHROPLASTY (Right)  Advance diet Up with therapy D/C IV fluids Past Medical History:  Diagnosis Date  . Anxiety   . Asthma    does not take any medications, hx of inhaler use  . Breast cancer [facility]) 1995/2016   right sided breast cancer in 08/1994  . Bronchitis  hx of  . GERD (gastroesophageal reflux disease)   . History of hiatal hernia   . Hypertension   . Hyperthyroidism     DVT Prophylaxis - ASA Protocol Weight-Bearing as tolerated to Right leg D/C home today Discussed D/C instructions  BISSELL, JACLYN M. 03/15/2018, 7:48 AM

## 2018-03-15 NOTE — Progress Notes (Signed)
Physical Therapy Treatment Patient Details Name: Tara Rodgers MRN: [id number] DOB: [birth year] Today's Date: 03/15/2018    History of Present Illness s/p R TKA    PT Comments    Pt continues motivated and progressing well with mobility.  Pt reviewed therex and stairs.   Follow Up Recommendations  Follow surgeon's recommendation for DC plan and follow-up therapies     Equipment Recommendations  Rolling walker with 5" wheels    Recommendations for Other Services       Precautions / Restrictions Precautions Precautions: Knee Precaution Comments: independent SLR today, KI not used this session, reviewed knee precautions Required Braces or Orthoses: Knee Immobilizer - Right Knee Immobilizer - Right: Discontinue once straight leg raise with < 10 degree lag Restrictions Weight Bearing Restrictions: No Other Position/Activity Restrictions: WBAT    Mobility  Bed Mobility Overal bed mobility: Needs Assistance Bed Mobility: Supine to Sit     Supine to sit: Supervision     General bed mobility comments: for safety  Transfers Overall transfer level: Needs assistance Equipment used: Rolling walker (2 wheeled) Transfers: Sit to/from Stand Sit to Stand: Supervision         General transfer comment: cues for hand placement  Ambulation/Gait Ambulation/Gait assistance: Min guard;Supervision Ambulation Distance (Feet): 130 Feet Assistive device: Rolling walker (2 wheeled) Gait Pattern/deviations: Step-to pattern;Step-through pattern     General Gait Details: cues for sequence, RW position   Stairs Stairs: Yes Stairs assistance: Min assist Stair Management: No rails;Step to pattern;Forwards;With walker Number of Stairs: 2 General stair comments: single step twice with RW and cues for sequence and foot/RW placement   Wheelchair Mobility    Modified Rankin (Stroke Patients Only)       Balance                                             Cognition Arousal/Alertness: Awake/alert Behavior During Therapy: WFL for tasks assessed/performed Overall Cognitive Status: Within Functional Limits for tasks assessed                                        Exercises Total Joint Exercises Ankle Circles/Pumps: AROM;Both;15 reps;Supine Quad Sets: AROM;Both;10 reps Heel Slides: AAROM;Right;Supine;20 reps Straight Leg Raises: AROM;Right;AAROM;20 reps;Supine    General Comments        Pertinent Vitals/Pain Pain Assessment: 0-10 Pain Score: 4  Pain Location: right knee  Pain Descriptors / Indicators: Discomfort;Sore Pain Intervention(s): Limited activity within patient's tolerance;Monitored during session;Premedicated before session;Ice applied    Home Living                      Prior Function            PT Goals (current goals can now be found in the care plan section) Acute Rehab PT Goals Patient Stated Goal: less knee  pain PT Goal Formulation: With patient Time For Goal Achievement: 03/21/18 Potential to Achieve Goals: Good Progress towards PT goals: Progressing toward goals    Frequency    7X/week      PT Plan Current plan remains appropriate    Co-evaluation              AM-PAC PT "6 Clicks" Daily Activity  Outcome Measure  Difficulty turning over in bed (including  adjusting bedclothes, sheets and blankets)?: A Little Difficulty moving from lying on back to sitting on the side of the bed? : A Little Difficulty sitting down on and standing up from a chair with arms (e.g., wheelchair, bedside commode, etc,.)?: A Little Help needed moving to and from a bed to chair (including a wheelchair)?: A Little Help needed walking in hospital room?: A Little Help needed climbing 3-5 steps with a railing? : A Little 6 Click Score: 18    End of Session Equipment Utilized During Treatment: Gait belt Activity Tolerance: Patient tolerated treatment well Patient left: with call bell/phone  within reach;in bed;with bed alarm set Nurse Communication: Mobility status PT Visit Diagnosis: Difficulty in walking, not elsewhere classified (R26.2)     Time: 6384-6659 PT Time Calculation (min) (ACUTE ONLY): 29 min  Charges:  $Gait Training: 8-22 mins $Therapeutic Exercise: 8-22 mins                    G Codes:       Pg [phone]    Lashawne Dura 03/15/2018, 12:25 PM

## 2018-03-15 NOTE — Progress Notes (Signed)
Discharge instructions and prescriptions given to patient and explained in detail. Patient verbalized understanding; escorted to lobby in wheel chair by nursing tech to be dc homed with spouse.

## 2018-03-15 NOTE — Discharge Summary (Signed)
Physician Discharge Summary   Patient ID: Tara Rodgers MRN: [id number] DOB/AGE: 03/08/1946 72 y.o.  Admit date: 03/13/2018 Discharge date:   Primary Diagnosis: Right knee primary osteoarthritis  Admission Diagnoses:  Past Medical History:  Diagnosis Date  . Anxiety   . Asthma    does not take any medications, hx of inhaler use  . Breast cancer [facility]) 1995/2016   right sided breast cancer in 08/1994  . Bronchitis    hx of  . GERD (gastroesophageal reflux disease)   . History of hiatal hernia   . Hypertension   . Hyperthyroidism    Discharge Diagnoses:   Active Problems:   Primary osteoarthritis of right knee   S/P knee replacement  Estimated body mass index is 26.66 kg/m as calculated from the following:   Height as of this encounter: '4\' 11"'  (1.499 m).   Weight as of this encounter: 59.9 kg (132 lb).  Procedure:  Procedure(s) (LRB): RIGHT TOTAL KNEE ARTHROPLASTY (Right)   Consults: None  HPI: see H&P Laboratory Data: Admission on 03/13/2018  Component Date Value Ref Range Status  . WBC 03/14/2018 12.6* 4.0 - 10.5 K/uL Final  . RBC 03/14/2018 3.70* 3.87 - 5.11 MIL/uL Final  . Hemoglobin 03/14/2018 10.6* 12.0 - 15.0 g/dL Final  . HCT 03/14/2018 32.8* 36.0 - 46.0 % Final  . MCV 03/14/2018 88.6  78.0 - 100.0 fL Final  . MCH 03/14/2018 28.6  26.0 - 34.0 pg Final  . MCHC 03/14/2018 32.3  30.0 - 36.0 g/dL Final  . RDW 03/14/2018 13.2  11.5 - 15.5 % Final  . Platelets 03/14/2018 238  150 - 400 K/uL Final   Performed at [facility], [place] [address], [place], [place] [address]  . Sodium 03/14/2018 139  135 - 145 mmol/L Final  . Potassium 03/14/2018 4.2  3.5 - 5.1 mmol/L Final  . Chloride 03/14/2018 107  101 - 111 mmol/L Final  . CO2 03/14/2018 20* 22 - 32 mmol/L Final  . Glucose, Bld 03/14/2018 150* 65 - 99 mg/dL Final  . BUN 03/14/2018 12  6 - 20 mg/dL Final  . Creatinine, Ser 03/14/2018 1.08* 0.44 - 1.00 mg/dL Final  . Calcium 03/14/2018 8.3* 8.9  - 10.3 mg/dL Final  . GFR calc non Af Amer 03/14/2018 51* >60 mL/min Final  . GFR calc Af Amer 03/14/2018 59* >60 mL/min Final   Comment: (NOTE) The eGFR has been calculated using the CKD EPI equation. This calculation has not been validated in all clinical situations. eGFR's persistently <60 mL/min signify possible Chronic Kidney Disease.   Georgiann Hahn gap 03/14/2018 12  5 - 15 Final   Performed at [facility], [place] [address], [place], [place] [address]  . WBC 03/15/2018 15.2* 4.0 - 10.5 K/uL Final  . RBC 03/15/2018 3.44* 3.87 - 5.11 MIL/uL Final  . Hemoglobin 03/15/2018 9.9* 12.0 - 15.0 g/dL Final  . HCT 03/15/2018 30.6* 36.0 - 46.0 % Final  . MCV 03/15/2018 89.0  78.0 - 100.0 fL Final  . MCH 03/15/2018 28.8  26.0 - 34.0 pg Final  . MCHC 03/15/2018 32.4  30.0 - 36.0 g/dL Final  . RDW 03/15/2018 13.5  11.5 - 15.5 % Final  . Platelets 03/15/2018 246  150 - 400 K/uL Final   Performed at [facility], [place] [address] [place], [place] [address]  Hospital Outpatient Visit on 03/06/2018  Component Date Value Ref Range Status  . MRSA, PCR 03/06/2018 NEGATIVE  NEGATIVE Final  .  Staphylococcus aureus 03/06/2018 NEGATIVE  NEGATIVE Final   Comment: (NOTE) The Xpert SA Assay (FDA approved for NASAL specimens in patients 84 years of age and older), is one component of a comprehensive surveillance program. It is not intended to diagnose infection nor to guide or monitor treatment. Performed at [facility], [place] [address], [place], [place] [address]   . aPTT 03/06/2018 31  24 - 36 seconds Final   Performed at [facility], [place] [address], [place], [place] [address]  . Sodium 03/06/2018 140  135 - 145 mmol/L Final  . Potassium 03/06/2018 4.1  3.5 - 5.1 mmol/L Final  . Chloride 03/06/2018 105  101 - 111 mmol/L Final  . CO2 03/06/2018 24  22 - 32 mmol/L Final  . Glucose, Bld 03/06/2018 100* 65 - 99 mg/dL Final  . BUN  03/06/2018 17  6 - 20 mg/dL Final  . Creatinine, Ser 03/06/2018 1.13* 0.44 - 1.00 mg/dL Final  . Calcium 03/06/2018 9.1  8.9 - 10.3 mg/dL Final  . GFR calc non Af Amer 03/06/2018 48* >60 mL/min Final  . GFR calc Af Amer 03/06/2018 56* >60 mL/min Final   Comment: (NOTE) The eGFR has been calculated using the CKD EPI equation. This calculation has not been validated in all clinical situations. eGFR's persistently <60 mL/min signify possible Chronic Kidney Disease.   Georgiann Hahn gap 03/06/2018 11  5 - 15 Final   Performed at [facility], [place] [address], [place], [place] [address]  . WBC 03/06/2018 9.6  4.0 - 10.5 K/uL Final  . RBC 03/06/2018 4.26  3.87 - 5.11 MIL/uL Final  . Hemoglobin 03/06/2018 12.5  12.0 - 15.0 g/dL Final  . HCT 03/06/2018 37.5  36.0 - 46.0 % Final  . MCV 03/06/2018 88.0  78.0 - 100.0 fL Final  . MCH 03/06/2018 29.3  26.0 - 34.0 pg Final  . MCHC 03/06/2018 33.3  30.0 - 36.0 g/dL Final  . RDW 03/06/2018 12.9  11.5 - 15.5 % Final  . Platelets 03/06/2018 281  150 - 400 K/uL Final   Performed at [facility], [place] [name], [place], [place] [address]  . Prothrombin Time 03/06/2018 12.6  11.4 - 15.2 seconds Final  . INR 03/06/2018 0.96   Final   Performed at [facility], [place] [address], [place], [place] [address]  . ABO/RH(D) 03/06/2018 A NEG   Final  . Antibody Screen 03/06/2018 NEG   Final  . Sample Expiration 03/06/2018 03/16/2018   Final  . Extend sample reason 03/06/2018    Final                   Value:NO TRANSFUSIONS OR PREGNANCY IN THE PAST 3 MONTHS Performed at [facility], [place] [address], [place], [place] [address]   . Color, Urine 03/06/2018 YELLOW  YELLOW Final  . APPearance 03/06/2018 CLEAR  CLEAR Final  . Specific Gravity, Urine 03/06/2018 1.017  1.005 - 1.030 Final  . pH 03/06/2018 5.0  5.0 - 8.0 Final  . Glucose, UA 03/06/2018 NEGATIVE  NEGATIVE mg/dL Final  . Hgb urine  dipstick 03/06/2018 NEGATIVE  NEGATIVE Final  . Bilirubin Urine 03/06/2018 NEGATIVE  NEGATIVE Final  . Ketones, ur 03/06/2018 NEGATIVE  NEGATIVE mg/dL Final  . Protein, ur 03/06/2018 NEGATIVE  NEGATIVE mg/dL Final  . Nitrite 03/06/2018 NEGATIVE  NEGATIVE Final  . Leukocytes, UA 03/06/2018 TRACE* NEGATIVE Final  . RBC / HPF 03/06/2018 0-5  0 - 5 RBC/hpf Final  . WBC, UA 03/06/2018 6-10  0 - 5 WBC/hpf Final  . Bacteria, UA 03/06/2018 NONE SEEN  NONE SEEN Final  . Squamous Epithelial / LPF 03/06/2018 0-5  0 - 5 Final  . Mucus 03/06/2018 PRESENT   Final   Performed at [facility], [place] [address] [place], [place] [address]  . ABO/RH(D) 03/06/2018    Final                   Value:A NEG Performed at [facility], [place] [address], [place], [name] [address]      X-Rays:No results found.  EKG: Orders placed or performed during the hospital encounter of 03/06/18  . EKG 12-Lead  . EKG 12-Lead     Hospital Course: Tara Rodgers is a 72 y.o. who was admitted to [facility]. They were brought to the operating room on 03/13/2018 and underwent Procedure(s): RIGHT TOTAL KNEE ARTHROPLASTY.  Patient tolerated the procedure well and was later transferred to the recovery room and then to the orthopaedic floor for postoperative care.  They were given PO and IV analgesics for pain control following their surgery.  They were given 24 hours of postoperative antibiotics of  Anti-infectives (From admission, onward)   Start     Dose/Rate Route Frequency Ordered Stop   03/13/18 2000  ceFAZolin (ANCEF) IVPB 2g/100 mL premix     2 g 200 mL/hr over 30 Minutes Intravenous Every 6 hours 03/13/18 1743 03/14/18 0223   03/13/18 1115  ceFAZolin (ANCEF) IVPB 2g/100 mL premix     2 g 200 mL/hr over 30 Minutes Intravenous On call to O.R. 03/13/18 1108 03/13/18 1335     and started on DVT prophylaxis in the form of Lovenox, TED hose and SCDs.   PT and OT were ordered for  total joint protocol.  Discharge planning consulted to help with postop disposition and equipment needs.  Patient had a fair night on the evening of surgery.  They started to get up OOB with therapy on day one. Hemovac drain was pulled without difficulty.  Continued to work with therapy into day two.  By day two, the patient had progressed with therapy and meeting their goals.  Incision was healing well.  Patient was seen in rounds and was ready to go home.   Diet: Regular diet Activity:WBAT Follow-up:in 10-14 days Disposition - Home Discharged Condition: good   Discharge Instructions    Call MD / Call 911   Complete by:  As directed    If you experience chest pain or shortness of breath, CALL 911 and be transported to the hospital emergency room.  If you develope a fever above 101 F, pus (white drainage) or increased drainage or redness at the wound, or calf pain, call your surgeon's office.   Call MD / Call 911   Complete by:  As directed    If you experience chest pain or shortness of breath, CALL 911 and be transported to the hospital emergency room.  If you develope a fever above 101 F, pus (white drainage) or increased drainage or redness at the wound, or calf pain, call your surgeon's office.   Constipation Prevention   Complete by:  As directed    Drink plenty of fluids.  Prune juice may be helpful.  You may use a stool softener, such as Colace (over the counter) 100 mg twice a day.  Use MiraLax (over the counter) for  constipation as needed.   Constipation Prevention   Complete by:  As directed    Drink plenty of fluids.  Prune juice may be helpful.  You may use a stool softener, such as Colace (over the counter) 100 mg twice a day.  Use MiraLax (over the counter) for constipation as needed.   Diet - low sodium heart healthy   Complete by:  As directed    Diet - low sodium heart healthy   Complete by:  As directed    Discharge instructions   Complete by:  As directed     INSTRUCTIONS AFTER JOINT REPLACEMENT   Remove items at home which could result in a fall. This includes throw rugs or furniture in walking pathways ICE to the affected joint every three hours while awake for 30 minutes at a time, for at least the first 3-5 days, and then as needed for pain and swelling.  Continue to use ice for pain and swelling. You may notice swelling that will progress down to the foot and ankle.  This is normal after surgery.  Elevate your leg when you are not up walking on it.   Continue to use the breathing machine you got in the hospital (incentive spirometer) which will help keep your temperature down.  It is common for your temperature to cycle up and down following surgery, especially at night when you are not up moving around and exerting yourself.  The breathing machine keeps your lungs expanded and your temperature down.   DIET:  As you were doing prior to hospitalization, we recommend a well-balanced diet.  DRESSING / WOUND CARE / SHOWERING  Keep the surgical dressing until follow up.  The dressing is water proof, so you can shower without any extra covering.  IF THE DRESSING FALLS OFF or the wound gets wet inside, change the dressing with sterile gauze.  Please use good hand washing techniques before changing the dressing.  Do not use any lotions or creams on the incision until instructed by your surgeon.    ACTIVITY  Increase activity slowly as tolerated, but follow the weight bearing instructions below.   No driving for 6 weeks or until further direction given by your physician.  You cannot drive while taking narcotics.  No lifting or carrying greater than 10 lbs. until further directed by your surgeon. Avoid periods of inactivity such as sitting longer than an hour when not asleep. This helps prevent blood clots.  You may return to work once you are authorized by your doctor.     WEIGHT BEARING   Weight bearing as tolerated with assist device (walker, cane,  etc) as directed, use it as long as suggested by your surgeon or therapist, typically at least 4-6 weeks.   EXERCISES  Results after joint replacement surgery are often greatly improved when you follow the exercise, range of motion and muscle strengthening exercises prescribed by your doctor. Safety measures are also important to protect the joint from further injury. Any time any of these exercises cause you to have increased pain or swelling, decrease what you are doing until you are comfortable again and then slowly increase them. If you have problems or questions, call your caregiver or physical therapist for advice.   Rehabilitation is important following a joint replacement. After just a few days of immobilization, the muscles of the leg can become weakened and shrink (atrophy).  These exercises are designed to build up the tone and strength of the thigh and leg  muscles and to improve motion. Often times heat used for twenty to thirty minutes before working out will loosen up your tissues and help with improving the range of motion but do not use heat for the first two weeks following surgery (sometimes heat can increase post-operative swelling).   These exercises can be done on a training (exercise) mat, on the floor, on a table or on a bed. Use whatever works the best and is most comfortable for you.    Use music or television while you are exercising so that the exercises are a pleasant break in your day. This will make your life better with the exercises acting as a break in your routine that you can look forward to.   Perform all exercises about fifteen times, three times per day or as directed.  You should exercise both the operative leg and the other leg as well.   Exercises include:   Quad Sets - Tighten up the muscle on the front of the thigh (Quad) and hold for 5-10 seconds.   Straight Leg Raises - With your knee straight (if you were given a brace, keep it on), lift the leg to 60  degrees, hold for 3 seconds, and slowly lower the leg.  Perform this exercise against resistance later as your leg gets stronger.  Leg Slides: Lying on your back, slowly slide your foot toward your buttocks, bending your knee up off the floor (only go as far as is comfortable). Then slowly slide your foot back down until your leg is flat on the floor again.  Angel Wings: Lying on your back spread your legs to the side as far apart as you can without causing discomfort.  Hamstring Strength:  Lying on your back, push your heel against the floor with your leg straight by tightening up the muscles of your buttocks.  Repeat, but this time bend your knee to a comfortable angle, and push your heel against the floor.  You may put a pillow under the heel to make it more comfortable if necessary.   A rehabilitation program following joint replacement surgery can speed recovery and prevent re-injury in the future due to weakened muscles. Contact your doctor or a physical therapist for more information on knee rehabilitation.    CONSTIPATION  Constipation is defined medically as fewer than three stools per week and severe constipation as less than one stool per week.  Even if you have a regular bowel pattern at home, your normal regimen is likely to be disrupted due to multiple reasons following surgery.  Combination of anesthesia, postoperative narcotics, change in appetite and fluid intake all can affect your bowels.   YOU MUST use at least one of the following options; they are listed in order of increasing strength to get the job done.  They are all available over the counter, and you may need to use some, POSSIBLY even all of these options:    Drink plenty of fluids (prune juice may be helpful) and high fiber foods Colace 100 mg by mouth twice a day  Senokot for constipation as directed and as needed Dulcolax (bisacodyl), take with full glass of water  Miralax (polyethylene glycol) once or twice a day as  needed.  If you have tried all these things and are unable to have a bowel movement in the first 3-4 days after surgery call either your surgeon or your primary doctor.    If you experience loose stools or diarrhea, hold the medications until  you stool forms back up.  If your symptoms do not get better within 1 week or if they get worse, check with your doctor.  If you experience "the worst abdominal pain ever" or develop nausea or vomiting, please contact the office immediately for further recommendations for treatment.   ITCHING:  If you experience itching with your medications, try taking only a single pain pill, or even half a pain pill at a time.  You can also use Benadryl over the counter for itching or also to help with sleep.   TED HOSE STOCKINGS:  Use stockings on both legs until for at least 2 weeks or as directed by physician office. They may be removed at night for sleeping.  MEDICATIONS:  See your medication summary on the "After Visit Summary" that nursing will review with you.  You may have some home medications which will be placed on hold until you complete the course of blood thinner medication.  It is important for you to complete the blood thinner medication as prescribed.  PRECAUTIONS:  If you experience chest pain or shortness of breath - call 911 immediately for transfer to the hospital emergency department.   If you develop a fever greater that 101 F, purulent drainage from wound, increased redness or drainage from wound, foul odor from the wound/dressing, or calf pain - CONTACT YOUR SURGEON.                                                   FOLLOW-UP APPOINTMENTS:  If you do not already have a post-op appointment, please call the office for an appointment to be seen by your surgeon.  Guidelines for how soon to be seen are listed in your "After Visit Summary", but are typically between 1-4 weeks after surgery.  OTHER INSTRUCTIONS:   Knee Replacement:  Do not place pillow  under knee, focus on keeping the knee straight while resting. CPM instructions: 0-90 degrees, 2 hours in the morning, 2 hours in the afternoon, and 2 hours in the evening. Place foam block, curve side up under heel at all times except when in CPM or when walking.  DO NOT modify, tear, cut, or change the foam block in any way.  MAKE SURE YOU:  Understand these instructions.  Get help right away if you are not doing well or get worse.    Thank you for letting us be a part of your medical care team.  It is a privilege we respect greatly.  We hope these instructions will help you stay on track for a fast and full recovery!   Increase activity slowly as tolerated   Complete by:  As directed    Increase activity slowly as tolerated   Complete by:  As directed      Allergies as of 03/15/2018      Reactions   Fosamax [alendronate Sodium] Other (See Comments)   Aching all over      Medication List    STOP taking these medications   cyclobenzaprine 10 MG tablet Commonly known as:  FLEXERIL   ibuprofen 200 MG tablet Commonly known as:  ADVIL,MOTRIN   naproxen 500 MG tablet Commonly known as:  NAPROSYN     TAKE these medications   ALPRAZolam 0.5 MG tablet Commonly known as:  XANAX TAKE 1 TABLET BY MOUTH TWICE DAILY AS  NEEDED What changed:    how much to take  how to take this  when to take this   aspirin EC 325 MG tablet Take 1 tablet (325 mg total) by mouth 2 (two) times daily.   letrozole 2.5 MG tablet Commonly known as:  FEMARA Take 1 tablet (2.5 mg total) by mouth daily.   levothyroxine 75 MCG tablet Commonly known as:  SYNTHROID, LEVOTHROID Take 1 tablet (75 mcg total) by mouth daily.   methocarbamol 500 MG tablet Commonly known as:  ROBAXIN Take 1 tablet (500 mg total) by mouth every 8 (eight) hours as needed for muscle spasms.   oxyCODONE 5 MG immediate release tablet Commonly known as:  ROXICODONE Take 1 tablet (5 mg total) by mouth every 4 (four) hours as  needed for up to 7 days.   pantoprazole 40 MG tablet Commonly known as:  PROTONIX Take 1 tablet (40 mg total) by mouth daily. What changed:  additional instructions   simvastatin 20 MG tablet Commonly known as:  ZOCOR Take 1 tablet (20 mg total) by mouth at bedtime.   verapamil 240 MG CR tablet Commonly known as:  CALAN-SR Take 1 tablet (240 mg total) by mouth daily. What changed:  how much to take   XIIDRA 5 % Soln Generic drug:  Lifitegrast Place 1 drop into both eyes 2 (two) times daily.            Durable Medical Equipment  (From admission, onward)        Start     Ordered   03/14/18 1147  For home use only DME 3 n 1  Once     03/14/18 1146   03/14/18 1147  For home use only DME Walker rolling  Once    Question:  Patient needs a walker to treat with the following condition  Answer:  S/P total knee arthroplasty, right   03/14/18 1146     Follow-up Information    [facility], [facility] Follow up.   Specialty:  [place] Why:  [place] will call to arrange initial visit Contact information: [place] [address] [place] [address] [phone]         Dr. Theda Sers 10-14 days post-op  Signed: Lacie Draft, PA-C Orthopaedic Surgery 03/15/2018, 7:52 AM

## 2018-03-16 ENCOUNTER — Encounter (HOSPITAL_COMMUNITY): Payer: Self-pay | Admitting: Specialist

## 2018-03-16 DIAGNOSIS — Z853 Personal history of malignant neoplasm of breast: Secondary | ICD-10-CM | POA: Diagnosis not present

## 2018-03-16 DIAGNOSIS — Z9181 History of falling: Secondary | ICD-10-CM | POA: Diagnosis not present

## 2018-03-16 DIAGNOSIS — M810 Age-related osteoporosis without current pathological fracture: Secondary | ICD-10-CM | POA: Diagnosis not present

## 2018-03-16 DIAGNOSIS — Z96651 Presence of right artificial knee joint: Secondary | ICD-10-CM | POA: Diagnosis not present

## 2018-03-16 DIAGNOSIS — I10 Essential (primary) hypertension: Secondary | ICD-10-CM | POA: Diagnosis not present

## 2018-03-16 DIAGNOSIS — Z471 Aftercare following joint replacement surgery: Secondary | ICD-10-CM | POA: Diagnosis not present

## 2018-03-16 DIAGNOSIS — F419 Anxiety disorder, unspecified: Secondary | ICD-10-CM | POA: Diagnosis not present

## 2018-03-24 DIAGNOSIS — Z9181 History of falling: Secondary | ICD-10-CM | POA: Diagnosis not present

## 2018-03-24 DIAGNOSIS — F419 Anxiety disorder, unspecified: Secondary | ICD-10-CM | POA: Diagnosis not present

## 2018-03-24 DIAGNOSIS — Z96651 Presence of right artificial knee joint: Secondary | ICD-10-CM | POA: Diagnosis not present

## 2018-03-24 DIAGNOSIS — I10 Essential (primary) hypertension: Secondary | ICD-10-CM | POA: Diagnosis not present

## 2018-03-24 DIAGNOSIS — Z853 Personal history of malignant neoplasm of breast: Secondary | ICD-10-CM | POA: Diagnosis not present

## 2018-03-24 DIAGNOSIS — M810 Age-related osteoporosis without current pathological fracture: Secondary | ICD-10-CM | POA: Diagnosis not present

## 2018-03-24 DIAGNOSIS — Z471 Aftercare following joint replacement surgery: Secondary | ICD-10-CM | POA: Diagnosis not present

## 2018-03-27 DIAGNOSIS — Z96651 Presence of right artificial knee joint: Secondary | ICD-10-CM | POA: Diagnosis not present

## 2018-03-27 DIAGNOSIS — Z471 Aftercare following joint replacement surgery: Secondary | ICD-10-CM | POA: Diagnosis not present

## 2018-04-01 DIAGNOSIS — M25661 Stiffness of right knee, not elsewhere classified: Secondary | ICD-10-CM | POA: Diagnosis not present

## 2018-04-06 DIAGNOSIS — M25661 Stiffness of right knee, not elsewhere classified: Secondary | ICD-10-CM | POA: Diagnosis not present

## 2018-04-07 DIAGNOSIS — H16223 Keratoconjunctivitis sicca, not specified as Sjogren's, bilateral: Secondary | ICD-10-CM | POA: Diagnosis not present

## 2018-04-07 DIAGNOSIS — H2513 Age-related nuclear cataract, bilateral: Secondary | ICD-10-CM | POA: Diagnosis not present

## 2018-04-08 DIAGNOSIS — M25661 Stiffness of right knee, not elsewhere classified: Secondary | ICD-10-CM | POA: Diagnosis not present

## 2018-04-10 DIAGNOSIS — M25661 Stiffness of right knee, not elsewhere classified: Secondary | ICD-10-CM | POA: Diagnosis not present

## 2018-04-13 DIAGNOSIS — M25661 Stiffness of right knee, not elsewhere classified: Secondary | ICD-10-CM | POA: Diagnosis not present

## 2018-04-15 DIAGNOSIS — M25661 Stiffness of right knee, not elsewhere classified: Secondary | ICD-10-CM | POA: Diagnosis not present

## 2018-04-15 NOTE — Progress Notes (Signed)
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) and adjuvant radiation ([name]).      12/13/2014 Mammogram    Right breast: possible asymmetry warranting further evaluation with spot compression views and possibly ultrasound      12/16/2014 Breast US    Right breast: no definitive abnormality within the superior aspect of the right breast to correspond with the mammographic finding.      01/10/2015 Breast MRI    Right breast: irregular rim enhancing mass within the superior right breast at 11:30 measuring 2.5 x 2.4 x 2.4 cm. No abnormal enhancement is identified within the skin of the superior right breast in the area of concern on mammogram.      01/12/2015 Breast US    Second look diagnostic mammogram and ultrasound showed a 2.3 cm mass in the upper midline right breast. No axillary adenopathy.      01/12/2015 Initial Biopsy    Right breast needle core bx (upper midline):  Invasive ductal carcinoma, grade 2, ER+ (100%), PR+ (77%), HER2/neu negative (ratio 1.15), Ki67 20%       01/30/2015 Procedure    Breast High/Moderate Risk panel ([facility]) reveals no clinically significant variant at ATM, BRCA1, BRCA2, CDH1, CHEK2, PALB2, PTEN, STK11, and TP53.      02/08/2015 Clinical Stage    Stage IIA (T2 N0)      03/23/2015 Definitive Surgery    Right mastectomy ([name]): invasive adenocarcinoma, grade 3, 2.9 cm, HER2/neu repeated, negative (ratio 1.23)      03/23/2015 Oncotype testing    Score: 8 (6% ROR). No chemotherapy [name]).      03/23/2015 Pathologic Stage    Stage IIA: pT2 pNx      05/04/2015 -  Anti-estrogen oral therapy    Letrozole 2.72m daily. Planned duration of therapy at least 5 years.       07/25/2015 Survivorship    Survivorship visit completed and copy of care plan provided to patient.      12/15/2015 Mammogram    IMPRESSION: No mammographic evidence of malignancy. A result letter of this screening mammogram will be mailed directly to the patient.      12/23/2016 Mammogram    IMPRESSION: No mammographic evidence of malignancy. A result letter of this screening mammogram will be mailed directly to the patient.       12/30/2017 Mammogram    12/30/2017 Mammogram IMPRESSION: No mammographic evidence of malignancy. A result letter of this screening mammogram will be mailed directly to the patient.  HISTORY OF PRESENTING ILLNESS:  Tara Rodgers 72 y.o. female is here because of recently diagnosed right breast cancer.  She had right breast cancer in 1996, which was treated with lumpectomy and axillary lymph node dissection, followed by adjuvant chemotherapy and radiation.   She did not have (/need) adjuvant endocrine therapy.  She has been followed with annual mammograms since then.  Her routine screening mammograms on 12/13/2014 showed a possible asymmetrically in the right breast. She underwent diagnostic mammogram and ultrasound  on 12/16/2014 which showed a suspicious irregular subcutaneous density within the right breast, no definitive sonographic correlation was identified. She subsequently underwent breast MRI on 01/11/2015, which showed a rim enhancing mass within the right breast at 11:30 position measuring 2.5 x 2.4 x 2.4 cm. No other lesion or adenopathy. She underwent core biopsy on 01/12/2015, which showed invasive ductal carcinoma, ER positive, PR positive, HER-2 negative, Ki-67 20%.  She feels well, did not feel any palpable mass before the screening mammogram. She denies any pain, dyspnea, fatigue or any other symptoms.   CURRENT THERAPY:   Letrozole 2.5 mg once daily, started on 05/04/2015.  INTERIM HISTORY  Tara Rodgers is a 72 y.o. female returns for follow up. She is here alone today. She had right knee replacement in May. She is doing PT and uses a walker. She doesn't walk the dog, her Rodgers does it. She was not able to afford osteoporosis injections, while pills cause her joint pain.     MEDICAL HISTORY:  Past Medical History:  Diagnosis Date  . Anxiety   . Asthma    does not take any medications, hx of inhaler use  . Breast cancer [name]) 1995/2016   right sided breast cancer in 08/1994  . Bronchitis    hx of  . GERD (gastroesophageal reflux disease)   . History of hiatal hernia   . Hypertension   . Hyperthyroidism     SURGICAL HISTORY: Past Surgical History:  Procedure Laterality Date  . BREAST BIOPSY Right 2016   malignant  . BREAST EXCISIONAL BIOPSY Left 1996   benign  . Carpel Tunnel  Bilateral   . INCONTINENCE SURGERY     with mesh insertion about 7 years ago  . LATISSIMUS FLAP TO BREAST Right 03/23/2015   Procedure: LATISSIMUS FLAP TO BREAST WITH PLACEMENT OF IMPLANT FOR BREAST RECONSTRUCTION;  Surgeon: [name], MD;  Location: [name];  Service: Plastics;  Laterality: Right;  . MASTECTOMY Right 03/23/2015  . RECONSTRUCTION BREAST W/ LATISSIMUS DORSI FLAP Right 03/23/2015    . TONSILLECTOMY     as a child  . TOTAL KNEE ARTHROPLASTY Right 03/13/2018   Procedure: RIGHT TOTAL KNEE ARTHROPLASTY;  Surgeon: Sydnee Cabal, MD;  Location: [facility];  Service: Orthopedics;  Laterality: Right;  Adductor Block  . TOTAL MASTECTOMY Right 03/23/2015   Procedure: RIGHT TOTAL MASTECTOMY;  Surgeon: [name], MD;  Location: [place];  Service: General;  Laterality: Right;    SOCIAL HISTORY: Social History   Socioeconomic History  . Marital status: Married    Spouse name: Not on file  . Number of children: 2  . Years of education: Not on file  . Highest education level: Not on file  Occupational History  . Not on file  Social Needs  . Financial resource strain: Not on file  . Food insecurity:    Worry: Not on file    Inability: Not on file  . Transportation needs:    Medical: Not on file  Non-medical: Not on file  Tobacco Use  . Smoking status: Never Smoker  . Smokeless tobacco: Never Used  Substance and Sexual Activity  . Alcohol use: Yes    Comment: occasional  . Drug use: No  . Sexual activity: Not on file  Lifestyle  . Physical activity:    Days per week: Not on file    Minutes per session: Not on file  . Stress: Not on file  Relationships  . Social connections:    Talks on phone: Not on file    Gets together: Not on file    Attends religious service: Not on file    Active member of club or organization: Not on file    Attends meetings of clubs or organizations: Not on file    Relationship status: Not on file  . Intimate partner violence:    Fear of current or ex partner: Not on file    Emotionally abused: Not on file    Physically abused: Not on file    Forced sexual activity: Not on file  Other Topics Concern  . Not on file  Social History Narrative  . Not on file   GYN HISTORY  Menarchal: 72 yo  LMP: 1990 Contraceptive:no    HRT: one year in [place]: Family History  Problem Relation Age of Onset  . COPD  Father   . Heart disease Father     ALLERGIES:  is allergic to fosamax [alendronate sodium].  MEDICATIONS:  Current Outpatient Medications  Medication Sig Dispense Refill  . ALPRAZolam (XANAX) 0.5 MG tablet TAKE 1 TABLET BY MOUTH TWICE DAILY AS NEEDED (Patient taking differently: TAKE 1 TABLET BY MOUTH TWICE DAILY AS NEEDED FOR ANXIETY) 180 tablet 0  . cyclobenzaprine (FLEXERIL) 10 MG tablet Take 10 mg by mouth 3 (three) times daily as needed for muscle spasms.    Marland Kitchen letrozole (FEMARA) 2.5 MG tablet Take 1 tablet (2.5 mg total) by mouth daily. 90 tablet 1  . levothyroxine (SYNTHROID, LEVOTHROID) 75 MCG tablet Take 1 tablet (75 mcg total) by mouth daily. 90 tablet 1  . Lifitegrast (XIIDRA) 5 % SOLN Place 1 drop into both eyes 2 (two) times daily.    . pantoprazole (PROTONIX) 40 MG tablet Take 1 tablet (40 mg total) by mouth daily. (Patient taking differently: Take 40 mg by mouth daily. May take a second 40 mg dose as needed for acid reflux) 90 tablet 1  . simvastatin (ZOCOR) 20 MG tablet Take 1 tablet (20 mg total) by mouth at bedtime. 90 tablet 3  . verapamil (CALAN-SR) 240 MG CR tablet Take 1 tablet (240 mg total) by mouth daily. (Patient taking differently: Take 120 mg by mouth daily. ) 90 tablet 1   No current facility-administered medications for this visit.     REVIEW OF SYSTEMS:   Constitutional: Denies fevers, chills or abnormal night sweats (+) uses cane to ambulate Eyes: Denies blurriness of vision, double vision or watery eyes Ears, nose, mouth, throat, and face: Denies mucositis or sore throat Respiratory: Denies cough, dyspnea or wheezes Cardiovascular: Denies palpitation, chest discomfort or lower extremity swelling Gastrointestinal:  Denies nausea, heartburn or change in bowel habits Skin: Denies abnormal skin rashes Lymphatics: Denies new lymphadenopathy or easy bruising MSK: (+) Right knee replacement  Neurological:Denies numbness, tingling or new  weaknesses Behavioral/Psych: Mood is stable, no new changes  All other systems were reviewed with the patient and are negative.  PHYSICAL EXAMINATION:  ECOG PERFORMANCE STATUS: 2  Vitals:   04/20/18 1408  BP: (!) 146/79  Pulse: 100  Resp: 18  Temp: 98.1 F (36.7 C)  SpO2: 100%   Filed Weights   04/20/18 1408  Weight: 130 lb 8 oz (59.2 kg)    GENERAL:alert, no distress and comfortable SKIN: skin color, texture, turgor are normal, no rashes or significant lesions EYES: normal, conjunctiva are pink and non-injected, sclera clear OROPHARYNX:no exudate, no erythema and lips, buccal mucosa, and tongue normal  NECK: supple, thyroid normal size, non-tender, without nodularity LYMPH:  no palpable lymphadenopathy in the cervical, axillary or inguinal LUNGS: clear to auscultation and percussion with normal breathing effort HEART: regular rate & rhythm and no murmurs and no lower extremity edema ABDOMEN:abdomen soft, non-tender and normal bowel sounds Musculoskeletal:no cyanosis of digits and no clubbing  PSYCH: alert & oriented x 3 with fluent speech NEURO: no focal motor/sensory deficits Breasts: Status post right simple mastectomy and reconstruction. Surgical scar is healing well. Palpation of the left breasts and axilla revealed no obvious mass that I could appreciate.   LABORATORY DATA:  I have reviewed the data as listed CBC Latest Ref Rng & Units 04/20/2018 03/15/2018 03/14/2018  WBC 3.9 - 10.3 K/uL 9.9 15.2(H) 12.6(H)  Hemoglobin 11.6 - 15.9 g/dL 11.9 9.9(L) 10.6(L)  Hematocrit 34.8 - 46.6 % 35.8 30.6(L) 32.8(L)  Platelets 145 - 400 K/uL 290 246 238    CMP Latest Ref Rng & Units 04/20/2018 03/14/2018 03/06/2018  Glucose 70 - 99 mg/dL 115(H) 150(H) 100(H)  BUN 8 - 23 mg/dL _0 Creatinine 0.44 - 1.00 mg/dL 1.18(H) 1.08(H) 1.13(H)  Sodium 135 - 145 mmol/L 140 139 140  Potassium 3.5 - 5.1 mmol/L 3.7 4.2 4.1  Chloride 98 - 111 mmol/L 106 107 105  CO2 22 - 32 mmol/L 26 20(L) 24   Calcium 8.9 - 10.3 mg/dL 9.6 8.3(L) 9.1  Total Protein 6.5 - 8.1 g/dL 7.2 - -  Total Bilirubin 0.3 - 1.2 mg/dL 0.3 - -  Alkaline Phos 38 - 126 U/L 94 - -  AST 15 - 41 U/L 12(L) - -  ALT 0 - 44 U/L 13 - -     PATHOLOGY REPORT Diagnosis 03/23/2015 Breast, simple mastectomy, right - INVASIVE ADENOCARCINOMA, SEE COMMENT. - PREVIOUS BIOPSY SITE PRESENT. - SEE TUMOR SYNOPTIC TEMPLATE BELOW. Microscopic Comment BREAST, INVASIVE TUMOR, WITHOUT LYMPH NODES PRESENT Specimen, including laterality: Right breast without lymph node sampling. Procedure: Simple mastectomy. Histologic type: Ductal. Grade: 3 of 3. Tubule formation: 3. Nuclear pleomorphism: 3. Mitotic: 2. Tumor size (gross measurement): 2.9 cm. Invasive, distance to closest margin: 0.5 cm (posterior). In-situ, distance to closest margin: N/A. If margin positive, focally or broadly: N/A. Lymphovascular invasion: Absent. Ductal carcinoma in situ: Absent. Grade: N/A. Extensive intraductal component: N/A. Lobular neoplasia: Absent. Tumor focality: Unifocal. Treatment effect: None. If present, treatment effect in breast tissue, lymph nodes or both: N/A. Extent of tumor: Skin: N/A. Nipple: N/A. Skeletal muscle: Negative for tumor.  Estrogen receptor: Not repeated, previous study demonstrated 100% positivity ([id number]). Progesterone receptor: Not repeated, previous study demonstrated 77% proliferation rate ([id number]). Her 2 neu: Repeated, previous study demonstrated no amplification (1.90) ([id number]). Ki-67: Not repeated, previous study demonstrated 20% proliferation rate ([id number]). Non-neoplastic breast: Previous biopsy site, fibrocytic change, coarse vascular calcifications. TNM: pT2, pNX, pMX.  Oncotype 03/23/2015   RADIOGRAPHIC STUDIES: I have personally reviewed the radiological images as listed and agreed with the findings in the report.   12/30/2017 Mammogram IMPRESSION: No mammographic evidence of  malignancy.  A result letter of this screening mammogram will be mailed directly to the patient.  Mammogram 12/23/2016 IMPRESSION: No mammographic evidence of malignancy. A result letter of this screening mammogram will be mailed directly to the patient.  MM screening tomo Left 12/15/2015  IMPRESSION: No mammographic evidence of malignancy. A result letter of this screening mammogram will be mailed directly to the patient.  RECOMMENDATION: Screening mammogram of the left breast in one year.  BI-RADS CATEGORY 1: Negative.  ASSESSMENT & PLAN:  72 y.o. female, with remote history of right breast cancer in 1996, status post lumpectomy with lymph node dissection, adjuvant chemotherapy and irradiation. She was found to have a right breast mass by screening mammogram.  1. Recurrent right breast invasive ductal carcinoma, pT2N0, stage II, grade 3, ER positive, PR positive, HER-2 negative -she is s/p mastectomy with completed resection, Oncotype showed low risk disease  - she has been tolerating letrozole very well since starting in 05/04/2015, we'll continue for total of 7-10 years due to her recurrent disease  -I again encouraged her to continue healthy diet and exercise, and watch her weight.  -She is clinically doing well. Lab reviewed, her CBC within normal limits. Her physical exam and her 12/2017 mammogram were unremarkable. There is no clinical concern for recurrence. - Will continue to monitor every 6 months while she is on Letrozole. Refilled today. Next mammogram 12/2018 -F/u in 6 months   2. Osteoporosis -She has known history of osteoporosis.  Last bone density scan on 07/12/2016 showed improvement into the osteopenia range - she is getting Prolia at her PCP's office  Every 6 months, she stopped due to high co-pay. -continue calcium 1 g, vitamin D at least 1000 units daily, and weight-bearing exercise distress or bone. - will check vitamin D yearly -I discussed the alternative  therapy, such as Zometa infusion every 6 months for 2 years.  Potential benefit and side effects discussed with her.  She previously did not tolerate oral biphosphonate well due to bone pain, she is little reluctant, but will  think about it.  I printed out reading material for her. -DEXA in October   3. Arthritis - she had a right knee replacement in May 2019 -Recovering well from her surgery, continue physical therapy.  PLAN -return to clinic in 6 months for follow-up with lab. -DEXA in October at [facility], ordered -I recommend zometa every 6 months for 2 years, she will think about it and call me if she decides to try    All questions were answered. The patient knows to call the clinic with any problems, questions or concerns. I spent 20 minutes counseling the patient face to face. The total time spent in the appointment was 25 minutes and more than 50% was on counseling.  Dierdre Searles Dweik am acting as scribe for Dr. [name].  I have reviewed the above documentation for accuracy and completeness, and I agree with the above.    [name], MD 04/20/2018

## 2018-04-17 DIAGNOSIS — M25661 Stiffness of right knee, not elsewhere classified: Secondary | ICD-10-CM | POA: Diagnosis not present

## 2018-04-20 ENCOUNTER — Telehealth: Payer: Self-pay | Admitting: Hematology

## 2018-04-20 ENCOUNTER — Encounter: Payer: Self-pay | Admitting: Hematology

## 2018-04-20 ENCOUNTER — Inpatient Hospital Stay: Payer: PPO | Attending: Hematology | Admitting: Hematology

## 2018-04-20 ENCOUNTER — Inpatient Hospital Stay: Payer: PPO

## 2018-04-20 VITALS — BP 146/79 | HR 100 | Temp 98.1°F | Resp 18 | Ht 59.0 in | Wt 130.5 lb

## 2018-04-20 DIAGNOSIS — C50911 Malignant neoplasm of unspecified site of right female breast: Secondary | ICD-10-CM

## 2018-04-20 DIAGNOSIS — Z79899 Other long term (current) drug therapy: Secondary | ICD-10-CM | POA: Diagnosis not present

## 2018-04-20 DIAGNOSIS — M25661 Stiffness of right knee, not elsewhere classified: Secondary | ICD-10-CM | POA: Diagnosis not present

## 2018-04-20 DIAGNOSIS — M818 Other osteoporosis without current pathological fracture: Secondary | ICD-10-CM | POA: Diagnosis not present

## 2018-04-20 DIAGNOSIS — C50411 Malignant neoplasm of upper-outer quadrant of right female breast: Secondary | ICD-10-CM | POA: Diagnosis not present

## 2018-04-20 DIAGNOSIS — M810 Age-related osteoporosis without current pathological fracture: Secondary | ICD-10-CM | POA: Diagnosis not present

## 2018-04-20 DIAGNOSIS — Z79811 Long term (current) use of aromatase inhibitors: Secondary | ICD-10-CM

## 2018-04-20 DIAGNOSIS — I10 Essential (primary) hypertension: Secondary | ICD-10-CM | POA: Diagnosis not present

## 2018-04-20 DIAGNOSIS — Z170 Estrogen receptor positive status [ER+]: Secondary | ICD-10-CM | POA: Diagnosis not present

## 2018-04-20 DIAGNOSIS — Z9011 Acquired absence of right breast and nipple: Secondary | ICD-10-CM | POA: Diagnosis not present

## 2018-04-20 LAB — COMPREHENSIVE METABOLIC PANEL
ALBUMIN: 4.1 g/dL (ref 3.5–5.0)
ALK PHOS: 94 U/L (ref 38–126)
ALT: 13 U/L (ref 0–44)
AST: 12 U/L — AB (ref 15–41)
Anion gap: 8 (ref 5–15)
BILIRUBIN TOTAL: 0.3 mg/dL (ref 0.3–1.2)
BUN: 19 mg/dL (ref 8–23)
CALCIUM: 9.6 mg/dL (ref 8.9–10.3)
CO2: 26 mmol/L (ref 22–32)
Chloride: 106 mmol/L (ref 98–111)
Creatinine, Ser: 1.18 mg/dL — ABNORMAL HIGH (ref 0.44–1.00)
GFR calc Af Amer: 53 mL/min — ABNORMAL LOW (ref >60)
GFR calc non Af Amer: 46 mL/min — ABNORMAL LOW (ref >60)
GLUCOSE: 115 mg/dL — AB (ref 70–99)
POTASSIUM: 3.7 mmol/L (ref 3.5–5.1)
Sodium: 140 mmol/L (ref 135–145)
TOTAL PROTEIN: 7.2 g/dL (ref 6.5–8.1)

## 2018-04-20 LAB — CBC WITH DIFFERENTIAL/PLATELET
BASOS ABS: 0.1 10*3/uL (ref 0.0–0.1)
Basophils Relative: 1 %
EOS PCT: 3 %
Eosinophils Absolute: 0.3 10*3/uL (ref 0.0–0.5)
HCT: 35.8 % (ref 34.8–46.6)
Hemoglobin: 11.9 g/dL (ref 11.6–15.9)
LYMPHS PCT: 22 %
Lymphs Abs: 2.2 10*3/uL (ref 0.9–3.3)
MCH: 28.4 pg (ref 25.1–34.0)
MCHC: 33.3 g/dL (ref 31.5–36.0)
MCV: 85.3 fL (ref 79.5–101.0)
Monocytes Absolute: 0.8 10*3/uL (ref 0.1–0.9)
Monocytes Relative: 8 %
Neutro Abs: 6.6 10*3/uL — ABNORMAL HIGH (ref 1.5–6.5)
Neutrophils Relative %: 66 %
Platelets: 290 10*3/uL (ref 145–400)
RBC: 4.19 MIL/uL (ref 3.70–5.45)
RDW: 14.6 % — ABNORMAL HIGH (ref 11.2–14.5)
WBC: 9.9 10*3/uL (ref 3.9–10.3)

## 2018-04-20 NOTE — Telephone Encounter (Signed)
Appointments scheduled AVS/Calendar printed per 7/8 los

## 2018-04-23 DIAGNOSIS — M25661 Stiffness of right knee, not elsewhere classified: Secondary | ICD-10-CM | POA: Diagnosis not present

## 2018-04-27 DIAGNOSIS — M25661 Stiffness of right knee, not elsewhere classified: Secondary | ICD-10-CM | POA: Diagnosis not present

## 2018-04-29 ENCOUNTER — Other Ambulatory Visit: Payer: Self-pay | Admitting: Internal Medicine

## 2018-04-30 DIAGNOSIS — M25661 Stiffness of right knee, not elsewhere classified: Secondary | ICD-10-CM | POA: Diagnosis not present

## 2018-05-04 DIAGNOSIS — M25661 Stiffness of right knee, not elsewhere classified: Secondary | ICD-10-CM | POA: Diagnosis not present

## 2018-05-07 DIAGNOSIS — M25661 Stiffness of right knee, not elsewhere classified: Secondary | ICD-10-CM | POA: Diagnosis not present

## 2018-05-11 DIAGNOSIS — M25661 Stiffness of right knee, not elsewhere classified: Secondary | ICD-10-CM | POA: Diagnosis not present

## 2018-05-13 DIAGNOSIS — M25661 Stiffness of right knee, not elsewhere classified: Secondary | ICD-10-CM | POA: Diagnosis not present

## 2018-05-18 DIAGNOSIS — M25661 Stiffness of right knee, not elsewhere classified: Secondary | ICD-10-CM | POA: Diagnosis not present

## 2018-05-21 DIAGNOSIS — M25661 Stiffness of right knee, not elsewhere classified: Secondary | ICD-10-CM | POA: Diagnosis not present

## 2018-05-25 DIAGNOSIS — M25661 Stiffness of right knee, not elsewhere classified: Secondary | ICD-10-CM | POA: Diagnosis not present

## 2018-05-29 ENCOUNTER — Other Ambulatory Visit: Payer: Self-pay | Admitting: Hematology

## 2018-05-29 DIAGNOSIS — M25661 Stiffness of right knee, not elsewhere classified: Secondary | ICD-10-CM | POA: Diagnosis not present

## 2018-05-29 DIAGNOSIS — C50911 Malignant neoplasm of unspecified site of right female breast: Secondary | ICD-10-CM

## 2018-06-01 DIAGNOSIS — M25661 Stiffness of right knee, not elsewhere classified: Secondary | ICD-10-CM | POA: Diagnosis not present

## 2018-06-04 DIAGNOSIS — M25661 Stiffness of right knee, not elsewhere classified: Secondary | ICD-10-CM | POA: Diagnosis not present

## 2018-06-09 DIAGNOSIS — M25661 Stiffness of right knee, not elsewhere classified: Secondary | ICD-10-CM | POA: Diagnosis not present

## 2018-06-12 DIAGNOSIS — M25661 Stiffness of right knee, not elsewhere classified: Secondary | ICD-10-CM | POA: Diagnosis not present

## 2018-06-17 ENCOUNTER — Other Ambulatory Visit: Payer: Self-pay | Admitting: Internal Medicine

## 2018-06-17 DIAGNOSIS — Z5181 Encounter for therapeutic drug level monitoring: Secondary | ICD-10-CM

## 2018-06-17 DIAGNOSIS — R739 Hyperglycemia, unspecified: Secondary | ICD-10-CM

## 2018-06-17 DIAGNOSIS — E039 Hypothyroidism, unspecified: Secondary | ICD-10-CM

## 2018-06-17 DIAGNOSIS — Z79899 Other long term (current) drug therapy: Secondary | ICD-10-CM

## 2018-06-17 DIAGNOSIS — E785 Hyperlipidemia, unspecified: Secondary | ICD-10-CM

## 2018-06-17 DIAGNOSIS — M25661 Stiffness of right knee, not elsewhere classified: Secondary | ICD-10-CM | POA: Diagnosis not present

## 2018-06-19 DIAGNOSIS — M25661 Stiffness of right knee, not elsewhere classified: Secondary | ICD-10-CM | POA: Diagnosis not present

## 2018-06-22 ENCOUNTER — Other Ambulatory Visit: Payer: PPO | Admitting: Internal Medicine

## 2018-06-22 DIAGNOSIS — R739 Hyperglycemia, unspecified: Secondary | ICD-10-CM

## 2018-06-22 DIAGNOSIS — E039 Hypothyroidism, unspecified: Secondary | ICD-10-CM

## 2018-06-22 DIAGNOSIS — E785 Hyperlipidemia, unspecified: Secondary | ICD-10-CM

## 2018-06-22 DIAGNOSIS — Z79899 Other long term (current) drug therapy: Secondary | ICD-10-CM

## 2018-06-22 DIAGNOSIS — Z5181 Encounter for therapeutic drug level monitoring: Secondary | ICD-10-CM

## 2018-06-23 DIAGNOSIS — M25661 Stiffness of right knee, not elsewhere classified: Secondary | ICD-10-CM | POA: Diagnosis not present

## 2018-06-23 LAB — HEPATIC FUNCTION PANEL
AG Ratio: 1.8 (calc) (ref 1.0–2.5)
ALKALINE PHOSPHATASE (APISO): 76 U/L (ref 33–130)
ALT: 11 U/L (ref 6–29)
AST: 15 U/L (ref 10–35)
Albumin: 4.3 g/dL (ref 3.6–5.1)
BILIRUBIN DIRECT: 0.1 mg/dL (ref 0.0–0.2)
BILIRUBIN INDIRECT: 0.3 mg/dL (ref 0.2–1.2)
GLOBULIN: 2.4 g/dL (ref 1.9–3.7)
TOTAL PROTEIN: 6.7 g/dL (ref 6.1–8.1)
Total Bilirubin: 0.4 mg/dL (ref 0.2–1.2)

## 2018-06-23 LAB — LIPID PANEL
CHOL/HDL RATIO: 2.6 (calc) (ref <5.0)
Cholesterol: 162 mg/dL (ref <200)
HDL: 62 mg/dL (ref >50)
LDL Cholesterol (Calc): 74 mg/dL (calc)
NON-HDL CHOLESTEROL (CALC): 100 mg/dL (ref <130)
TRIGLYCERIDES: 154 mg/dL — AB (ref <150)

## 2018-06-23 LAB — HEMOGLOBIN A1C
HEMOGLOBIN A1C: 5.5 %{Hb} (ref <5.7)
Mean Plasma Glucose: 111 (calc)
eAG (mmol/L): 6.2 (calc)

## 2018-06-23 LAB — MICROALBUMIN / CREATININE URINE RATIO
Creatinine, Urine: 57 mg/dL (ref 20–275)
Microalb Creat Ratio: 47 mcg/mg creat — ABNORMAL HIGH (ref <30)
Microalb, Ur: 2.7 mg/dL

## 2018-06-23 LAB — TSH: TSH: 1.61 mIU/L (ref 0.40–4.50)

## 2018-06-25 ENCOUNTER — Ambulatory Visit (INDEPENDENT_AMBULATORY_CARE_PROVIDER_SITE_OTHER): Payer: PPO | Admitting: Internal Medicine

## 2018-06-25 ENCOUNTER — Encounter: Payer: Self-pay | Admitting: Internal Medicine

## 2018-06-25 VITALS — BP 120/80 | Ht 59.0 in | Wt 128.0 lb

## 2018-06-25 DIAGNOSIS — F411 Generalized anxiety disorder: Secondary | ICD-10-CM

## 2018-06-25 DIAGNOSIS — E785 Hyperlipidemia, unspecified: Secondary | ICD-10-CM | POA: Diagnosis not present

## 2018-06-25 DIAGNOSIS — Z96651 Presence of right artificial knee joint: Secondary | ICD-10-CM | POA: Diagnosis not present

## 2018-06-25 DIAGNOSIS — K219 Gastro-esophageal reflux disease without esophagitis: Secondary | ICD-10-CM

## 2018-06-25 DIAGNOSIS — E039 Hypothyroidism, unspecified: Secondary | ICD-10-CM | POA: Diagnosis not present

## 2018-06-25 DIAGNOSIS — C50911 Malignant neoplasm of unspecified site of right female breast: Secondary | ICD-10-CM

## 2018-06-25 DIAGNOSIS — Z23 Encounter for immunization: Secondary | ICD-10-CM

## 2018-06-25 DIAGNOSIS — R7302 Impaired glucose tolerance (oral): Secondary | ICD-10-CM

## 2018-06-25 DIAGNOSIS — Z8739 Personal history of other diseases of the musculoskeletal system and connective tissue: Secondary | ICD-10-CM

## 2018-06-25 NOTE — Patient Instructions (Signed)
It was a pleaseure to see you today. Continue same meds and RTC in 6 months. Flu vaccine given.

## 2018-06-25 NOTE — Progress Notes (Signed)
   Subjective:    Patient ID: Tara Rodgers, female    DOB: Sep 18, 1946, 72 y.o.   MRN: [id number]  HPI 72 year old Female for 6 month recheck. Flu vaccine given.    Had right knee arthroplasty May 2019 and is still in physical therapy.  History of osteoporosis.  Has taken Prolia in the past but not recently.  Apparently co-pays were expensive.  Triglycerides have improved from 196 6 months ago to 154.  Liver panel is normal.  Is on statin medication hemoglobin A1c 5.5%.  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    Review of Systems  Respiratory: Negative.   Cardiovascular: Negative.   Gastrointestinal: Negative.   Musculoskeletal:       Persistent right knee stiffness       Objective:   Physical Exam  Constitutional: She appears well-nourished.  Neck: No JVD present. No thyromegaly present.  Cardiovascular: Normal rate, regular rhythm and normal heart sounds.  No murmur heard. Pulmonary/Chest: Effort normal and breath sounds normal. No stridor. No respiratory distress. She has no wheezes. She has no rales.  Musculoskeletal: She exhibits no edema.  Lymphadenopathy:    She has no cervical adenopathy.  Psychiatric: She has a normal mood and affect. Her behavior is normal. Thought content normal.  Vitals reviewed.         Assessment & Plan:    Essential hypertension-stable on current regimen  History of breast cancer- still on Femara  Situational stress  Allergic rhinitis-continue same medications  History of anxiety treated with antianxiety medication  Hyperlipidemia-triglycerides improved.  Continue statin medication  GE reflux-treated with PPI  Hypothyroidism-TSH stable on current dose of thyroid replacement  Status post right knee arthroplasty with persistent  stiffness  History of stress urinary incontinence  Osteoarthritis of hands  History of osteoporosis-currently not on Prolia  Plan: Continue current medications and follow-up in 6 months  25 minutes spent with patient

## 2018-06-26 DIAGNOSIS — M25661 Stiffness of right knee, not elsewhere classified: Secondary | ICD-10-CM | POA: Diagnosis not present

## 2018-06-30 DIAGNOSIS — M25661 Stiffness of right knee, not elsewhere classified: Secondary | ICD-10-CM | POA: Diagnosis not present

## 2018-07-03 DIAGNOSIS — M25661 Stiffness of right knee, not elsewhere classified: Secondary | ICD-10-CM | POA: Diagnosis not present

## 2018-07-07 DIAGNOSIS — M25661 Stiffness of right knee, not elsewhere classified: Secondary | ICD-10-CM | POA: Diagnosis not present

## 2018-07-10 DIAGNOSIS — M25661 Stiffness of right knee, not elsewhere classified: Secondary | ICD-10-CM | POA: Diagnosis not present

## 2018-07-12 ENCOUNTER — Encounter: Payer: Self-pay | Admitting: Internal Medicine

## 2018-07-13 ENCOUNTER — Encounter: Payer: Self-pay | Admitting: Hematology

## 2018-07-13 DIAGNOSIS — M8589 Other specified disorders of bone density and structure, multiple sites: Secondary | ICD-10-CM | POA: Diagnosis not present

## 2018-07-15 DIAGNOSIS — M25661 Stiffness of right knee, not elsewhere classified: Secondary | ICD-10-CM | POA: Diagnosis not present

## 2018-07-17 DIAGNOSIS — M25661 Stiffness of right knee, not elsewhere classified: Secondary | ICD-10-CM | POA: Diagnosis not present

## 2018-07-21 DIAGNOSIS — M25661 Stiffness of right knee, not elsewhere classified: Secondary | ICD-10-CM | POA: Diagnosis not present

## 2018-07-22 ENCOUNTER — Telehealth: Payer: Self-pay

## 2018-07-22 DIAGNOSIS — Z96651 Presence of right artificial knee joint: Secondary | ICD-10-CM | POA: Diagnosis not present

## 2018-07-22 NOTE — Telephone Encounter (Signed)
Left voice message for patient received bone density scan results, osteopenia has improved since 2 years ago per Dr. [name].

## 2018-07-24 DIAGNOSIS — M25661 Stiffness of right knee, not elsewhere classified: Secondary | ICD-10-CM | POA: Diagnosis not present

## 2018-07-27 DIAGNOSIS — M25661 Stiffness of right knee, not elsewhere classified: Secondary | ICD-10-CM | POA: Diagnosis not present

## 2018-07-29 DIAGNOSIS — M25661 Stiffness of right knee, not elsewhere classified: Secondary | ICD-10-CM | POA: Diagnosis not present

## 2018-08-03 ENCOUNTER — Ambulatory Visit (INDEPENDENT_AMBULATORY_CARE_PROVIDER_SITE_OTHER): Payer: PPO | Admitting: Internal Medicine

## 2018-08-03 ENCOUNTER — Encounter: Payer: Self-pay | Admitting: Internal Medicine

## 2018-08-03 VITALS — BP 110/80 | HR 82 | Temp 97.8°F | Wt 130.0 lb

## 2018-08-03 DIAGNOSIS — N390 Urinary tract infection, site not specified: Secondary | ICD-10-CM | POA: Diagnosis not present

## 2018-08-03 DIAGNOSIS — R300 Dysuria: Secondary | ICD-10-CM

## 2018-08-03 LAB — POCT URINALYSIS DIPSTICK
Appearance: NORMAL
Bilirubin, UA: NEGATIVE
Glucose, UA: NEGATIVE
Ketones, UA: NEGATIVE
Nitrite, UA: NEGATIVE
Odor: NORMAL
PH UA: 6 (ref 5.0–8.0)
PROTEIN UA: NEGATIVE
Urobilinogen, UA: 0.2 E.U./dL

## 2018-08-03 MED ORDER — CIPROFLOXACIN HCL 250 MG PO TABS: 250.0000 mg | ORAL_TABLET | Freq: Two times a day (BID) | ORAL | 0 refills | Status: DC

## 2018-08-03 NOTE — Patient Instructions (Addendum)
Cipro 250 mg bid x 10 days.

## 2018-08-03 NOTE — Progress Notes (Signed)
   Subjective:    Patient ID: Tara Rodgers, female    DOB: 1946/09/03, 72 y.o.   MRN: [id number]  HPI Has had dysuria since mid- September. No fever of chills. No back pain just dysuria. No hematuria.  Had similar symptoms August 2018. Was treated with Cipro and improved though culture had no growth.    Review of Systems see above. No Nausea and vomiting.     Objective:   Physical Exam No CVA tenderness. Urine dipstick 3+ LE       Assessment & Plan:  Urinary Tract Infection- protracted as had symptoms since mid-September  Plan: Cipro 250 mg bid x 10 days.

## 2018-08-05 DIAGNOSIS — M25661 Stiffness of right knee, not elsewhere classified: Secondary | ICD-10-CM | POA: Diagnosis not present

## 2018-08-07 DIAGNOSIS — M25661 Stiffness of right knee, not elsewhere classified: Secondary | ICD-10-CM | POA: Diagnosis not present

## 2018-08-10 DIAGNOSIS — M25661 Stiffness of right knee, not elsewhere classified: Secondary | ICD-10-CM | POA: Diagnosis not present

## 2018-08-11 ENCOUNTER — Other Ambulatory Visit: Payer: Self-pay | Admitting: Internal Medicine

## 2018-08-11 MED ORDER — ALPRAZOLAM 0.5 MG PO TABS: 0.5000 mg | ORAL_TABLET | Freq: Two times a day (BID) | ORAL | 1 refills | Status: DC | PRN

## 2018-08-11 NOTE — Telephone Encounter (Signed)
Patient called requesting xanax refill to [facility].  Please advise.

## 2018-08-13 DIAGNOSIS — M25661 Stiffness of right knee, not elsewhere classified: Secondary | ICD-10-CM | POA: Diagnosis not present

## 2018-08-17 DIAGNOSIS — M25661 Stiffness of right knee, not elsewhere classified: Secondary | ICD-10-CM | POA: Diagnosis not present

## 2018-08-19 DIAGNOSIS — M25661 Stiffness of right knee, not elsewhere classified: Secondary | ICD-10-CM | POA: Diagnosis not present

## 2018-08-24 DIAGNOSIS — M25661 Stiffness of right knee, not elsewhere classified: Secondary | ICD-10-CM | POA: Diagnosis not present

## 2018-08-26 DIAGNOSIS — M25661 Stiffness of right knee, not elsewhere classified: Secondary | ICD-10-CM | POA: Diagnosis not present

## 2018-09-01 DIAGNOSIS — M25661 Stiffness of right knee, not elsewhere classified: Secondary | ICD-10-CM | POA: Diagnosis not present

## 2018-09-02 DIAGNOSIS — Z471 Aftercare following joint replacement surgery: Secondary | ICD-10-CM | POA: Diagnosis not present

## 2018-09-02 DIAGNOSIS — Z96651 Presence of right artificial knee joint: Secondary | ICD-10-CM | POA: Diagnosis not present

## 2018-10-16 ENCOUNTER — Other Ambulatory Visit: Payer: Self-pay | Admitting: Internal Medicine

## 2018-10-19 DIAGNOSIS — H2513 Age-related nuclear cataract, bilateral: Secondary | ICD-10-CM | POA: Diagnosis not present

## 2018-10-19 NOTE — Progress Notes (Signed)
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    12/13/2014 Mammogram    Right breast: possible asymmetry warranting further evaluation with spot compression views and possibly ultrasound    12/16/2014 Breast US    Right breast: no definitive abnormality within the superior aspect of the right breast to correspond with the mammographic finding.    01/10/2015 Breast MRI    Right breast: irregular rim enhancing mass within the superior right breast at 11:30 measuring 2.5 x 2.4 x 2.4 cm. No abnormal enhancement is identified within the skin of the superior right breast in the area of concern on mammogram.    01/12/2015 Breast US    Second look diagnostic mammogram and ultrasound showed a 2.3 cm mass in the upper midline right breast. No axillary adenopathy.    01/12/2015 Initial Biopsy    Right breast needle core bx (upper  midline): Invasive ductal carcinoma, grade 2, ER+ (100%), PR+ (77%), HER2/neu negative (ratio 1.15), Ki67 20%     01/30/2015 Procedure    Breast High/Moderate Risk panel ([facility]) reveals no clinically significant variant at ATM, BRCA1, BRCA2, CDH1, CHEK2, PALB2, PTEN, STK11, and TP53.    02/08/2015 Clinical Stage    Stage IIA (T2 N0)    03/23/2015 Definitive Surgery    Right mastectomy (Tara Rodgers): invasive adenocarcinoma, grade 3, 2.9 cm, HER2/neu repeated, negative (ratio 1.23)    03/23/2015 Oncotype testing    Score: 8 (6% ROR). No chemotherapy Tara Medico).    03/23/2015 Pathologic Stage    Stage IIA: pT2 pNx    05/04/2015 -  Anti-estrogen oral therapy    Letrozole 2.34m daily. Planned duration of therapy at least 5 years.     07/25/2015 Survivorship    Survivorship visit completed and copy of care plan provided to patient.    12/15/2015 Mammogram    IMPRESSION: No mammographic evidence of malignancy. A result letter of this screening mammogram will be mailed directly to the patient.    12/23/2016 Mammogram    IMPRESSION: No mammographic evidence of malignancy. A result letter of this screening mammogram will be mailed directly to the patient.     12/30/2017 Mammogram    12/30/2017 Mammogram IMPRESSION: No mammographic evidence of malignancy. A result letter of this screening mammogram will be mailed directly to the patient.      CURRENT THERAPY:  Letrozole 2.5 mg once daily, started on 05/04/2015.  INTERVAL HISTORY:  Tara Rodgers here for a follow  up of right breast cancer. She was last seen by me 6 months ago. She presents to the clinic today by herself. She notes since her last visit she had a right knee replacement and her pain has resolved since.  Her HR today is 122 for vital signs. Repeated level was normal. She denies chest pain. She is tolerating letrozole well.     REVIEW OF SYSTEMS:   Constitutional: Denies fevers, chills or abnormal weight loss Eyes: Denies  blurriness of vision Ears, nose, mouth, throat, and face: Denies mucositis or sore throat Respiratory: Denies cough, dyspnea or wheezes Cardiovascular: Denies palpitation, chest discomfort or lower extremity swelling Gastrointestinal:  Denies nausea, heartburn or change in bowel habits Skin: Denies abnormal skin rashes MSK: (+) Right Knee replacement  Lymphatics: Denies new lymphadenopathy or easy bruising Neurological:Denies numbness, tingling or new weaknesses Behavioral/Psych: Mood is stable, no new changes  All other systems were reviewed with the patient and are negative.  MEDICAL HISTORY:  Past Medical History:  Diagnosis Date  . Anxiety   . Asthma    does not take any medications, hx of inhaler use  . Breast cancer Tara Rodgers) 1995/2016   right sided breast cancer in 08/1994  . Bronchitis    hx of  . GERD (gastroesophageal reflux disease)   . History of hiatal hernia   . Hypertension   . Hyperthyroidism     SURGICAL HISTORY: Past Surgical History:  Procedure Laterality Date  . BREAST BIOPSY Right 2016   malignant  . BREAST EXCISIONAL BIOPSY Left 1996   benign  . Carpel Tunnel  Bilateral   . INCONTINENCE SURGERY     with mesh insertion about 7 years ago  . LATISSIMUS FLAP TO BREAST Right 03/23/2015   Procedure: LATISSIMUS FLAP TO BREAST WITH PLACEMENT OF IMPLANT FOR BREAST RECONSTRUCTION;  Surgeon: Tara Reese, MD;  Location: [facility];  Service: Plastics;  Laterality: Right;  . MASTECTOMY Right 03/23/2015  . RECONSTRUCTION BREAST W/ LATISSIMUS DORSI FLAP Right 03/23/2015  . TONSILLECTOMY     as a child  . TOTAL KNEE ARTHROPLASTY Right 03/13/2018   Procedure: RIGHT TOTAL KNEE ARTHROPLASTY;  Surgeon: Sydnee Cabal, MD;  Location: Tara [facility];  Service: Orthopedics;  Laterality: Right;  Adductor Block  . TOTAL MASTECTOMY Right 03/23/2015   Procedure: RIGHT TOTAL MASTECTOMY;  Surgeon: Tara Seltzer, MD;  Location: [facility];  Service: General;  Laterality: Right;    I have  reviewed the social history and family history with the patient and they are unchanged from previous note.  ALLERGIES:  is allergic to fosamax [alendronate sodium].  MEDICATIONS:  Current Outpatient Medications  Medication Sig Dispense Refill  . ALPRAZolam (XANAX) 0.5 MG tablet Take 1 tablet (0.5 mg total) by mouth 2 (two) times daily as needed. 180 tablet 1  . ciprofloxacin (CIPRO) 250 MG tablet Take 1 tablet (250 mg total) by mouth 2 (two) times daily. 20 tablet 0  . cyclobenzaprine (FLEXERIL) 10 MG tablet Take 10 mg by mouth 3 (three) times daily as needed for muscle spasms.    Marland Kitchen letrozole (FEMARA) 2.5 MG tablet TAKE 1 TABLET ONCE DAILY. 90 tablet 3  . levothyroxine (SYNTHROID, LEVOTHROID) 75 MCG tablet TAKE 1 TABLET BY MOUTH DAILY. 90 tablet 0  . Lifitegrast (XIIDRA) 5 % SOLN Place 1 drop into both eyes 2 (two) times daily.    . pantoprazole (PROTONIX) 40 MG tablet TAKE 1 TABLET BY MOUTH EVERY DAY 90 tablet 3  . simvastatin (ZOCOR) 20 MG tablet Take 1  tablet (20 mg total) by mouth at bedtime. 90 tablet 3  . verapamil (CALAN-SR) 240 MG CR tablet Take 1 tablet (240 mg total) by mouth daily. (Patient taking differently: Take 120 mg by mouth daily. ) 90 tablet 1   No current facility-administered medications for this visit.     PHYSICAL EXAMINATION: ECOG PERFORMANCE STATUS: 0 - Asymptomatic  Vitals:   10/21/18 1351  BP: 139/80  Pulse: (!) 122  Resp: 20  Temp: 98.8 F (37.1 C)  SpO2: 96%   Filed Weights   10/21/18 1351  Weight: 130 lb 11.2 oz (59.3 kg)    GENERAL:alert, no distress and comfortable SKIN: skin color, texture, turgor are normal, no rashes or significant lesions EYES: normal, Conjunctiva are pink and non-injected, sclera clear OROPHARYNX:no exudate, no erythema and lips, buccal mucosa, and tongue normal  NECK: supple, thyroid normal size, non-tender, without nodularity LYMPH:  no palpable lymphadenopathy in the cervical, axillary or inguinal LUNGS: clear to  auscultation and percussion with normal breathing effort HEART: regular rate & rhythm and no murmurs and no lower extremity edema ABDOMEN:abdomen soft, non-tender and normal bowel sounds Musculoskeletal:no cyanosis of digits and no clubbing  NEURO: alert & oriented x 3 with fluent speech, no focal motor/sensory deficits BREAST: s/p right breast mastectomy: surgical incision healed well (+) left nipple flat, slightly retracted. No palpable mass or adenopathy in either breast  LABORATORY DATA:  I have reviewed the data as listed CBC Latest Ref Rng & Units 10/21/2018 04/20/2018 03/15/2018  WBC 4.0 - 10.5 K/uL 8.5 9.9 15.2(H)  Hemoglobin 12.0 - 15.0 g/dL 12.3 11.9 9.9(L)  Hematocrit 36.0 - 46.0 % 38.1 35.8 30.6(L)  Platelets 150 - 400 K/uL 280 290 246     CMP Latest Ref Rng & Units 10/21/2018 06/22/2018 04/20/2018  Glucose 70 - 99 mg/dL 110(H) - 115(H)  BUN 8 - 23 mg/dL 20 - 19  Creatinine 0.44 - 1.00 mg/dL 1.19(H) - 1.18(H)  Sodium 135 - 145 mmol/L 139 - 140  Potassium 3.5 - 5.1 mmol/L 4.2 - 3.7  Chloride 98 - 111 mmol/L 105 - 106  CO2 22 - 32 mmol/L 25 - 26  Calcium 8.9 - 10.3 mg/dL 9.0 - 9.6  Total Protein 6.5 - 8.1 g/dL 6.9 6.7 7.2  Total Bilirubin 0.3 - 1.2 mg/dL 0.4 0.4 0.3  Alkaline Phos 38 - 126 U/L 88 - 94  AST 15 - 41 U/L 14(L) 15 12(L)  ALT 0 - 44 U/L _0 RADIOGRAPHIC STUDIES: I have personally reviewed the radiological images as listed and agreed with the findings in the report. No results found.   ASSESSMENT & PLAN:  Tara Rodgers is a 74 y.o. female with   1. Recurrent right breast invasive ductal carcinoma, pT2N0, stage II, grade 3, ER positive, PR positive, HER-2 negative -She was initially diagnosed in 1996 with right breast cancer. She was treated with right breast lumpectomy, adjuvant 6 months chemo with CMF and adjuvant radiation.  -She was diagnosed with cancer recurrence in 12/2014. She is s/p right mastectomy with completed resection, Oncotype showed low  risk disease so I did not recommend adjuvant chemotherapy.  -She started anti-estrogen therapy with letrozole in 04/2015, we'll continue for total of 7-10 years due to her recurrent disease. Tolerating well.  -She is clinically doing well. Lab reviewed, her CBC  Is WNL, VitD and CMP is till pending. Her physical exam and her 12/2017 mammogram were unremarkable. There is no clinical  concern for recurrence. -Will continue to monitor every 6 months while she is on Letrozole. Next mammogram 12/2018  2. Osteoporosis -She was getting Prolia at her PCP's office  Every 6 months, she stopped due to high co-pay. -Her DEXA from 07/13/18 shows improved density with lowest T-Score of -2.2 at [facility] with fracture risk of 20% to spine and 6.4% risk of hip fracture.  -She declined bisphosphonate with Zometa injections -continue calcium 1 g, vitamin D at least 1000 units daily, and weight-bearing exercise distress or bone.  3. Arthritis -She had a right knee replacement in May 2019 -Recovered well from her surgery, continue physical therapy.  PLAN -Continue Letrozole  -lab and follow up in 6 months    No problem-specific Assessment & Plan notes found for this encounter.   Orders Placed This Encounter  Procedures  . MM DIAG BREAST TOMO UNI LEFT    Standing Status:   Future    Standing Expiration Date:   10/22/2019    Order Specific Question:   Reason for Exam (SYMPTOM  OR DIAGNOSIS REQUIRED)    Answer:   screening    Order Specific Question:   Preferred imaging location?    Answer:   [facility]   All questions were answered. The patient knows to call the clinic with any problems, questions or concerns. No barriers to learning was detected. I spent 15 minutes counseling the patient face to face. The total time spent in the appointment was 20 minutes and more than 50% was on counseling and review of test results     Tara Merle, MD 10/21/2018   I, Tara Rodgers, am acting as scribe for Tara Merle,  MD.   I have reviewed the above documentation for accuracy and completeness, and I agree with the above.

## 2018-10-21 ENCOUNTER — Telehealth: Payer: Self-pay

## 2018-10-21 ENCOUNTER — Inpatient Hospital Stay (HOSPITAL_BASED_OUTPATIENT_CLINIC_OR_DEPARTMENT_OTHER): Payer: PPO | Admitting: Hematology

## 2018-10-21 ENCOUNTER — Inpatient Hospital Stay: Payer: PPO | Attending: Hematology

## 2018-10-21 ENCOUNTER — Encounter: Payer: Self-pay | Admitting: Hematology

## 2018-10-21 VITALS — BP 139/80 | HR 122 | Temp 98.8°F | Resp 20 | Ht 59.0 in | Wt 130.7 lb

## 2018-10-21 DIAGNOSIS — C50411 Malignant neoplasm of upper-outer quadrant of right female breast: Secondary | ICD-10-CM | POA: Insufficient documentation

## 2018-10-21 DIAGNOSIS — Z79899 Other long term (current) drug therapy: Secondary | ICD-10-CM | POA: Diagnosis not present

## 2018-10-21 DIAGNOSIS — Z79811 Long term (current) use of aromatase inhibitors: Secondary | ICD-10-CM | POA: Insufficient documentation

## 2018-10-21 DIAGNOSIS — M818 Other osteoporosis without current pathological fracture: Secondary | ICD-10-CM | POA: Diagnosis not present

## 2018-10-21 DIAGNOSIS — Z170 Estrogen receptor positive status [ER+]: Secondary | ICD-10-CM | POA: Insufficient documentation

## 2018-10-21 DIAGNOSIS — C50911 Malignant neoplasm of unspecified site of right female breast: Secondary | ICD-10-CM

## 2018-10-21 DIAGNOSIS — M810 Age-related osteoporosis without current pathological fracture: Secondary | ICD-10-CM

## 2018-10-21 DIAGNOSIS — I10 Essential (primary) hypertension: Secondary | ICD-10-CM | POA: Insufficient documentation

## 2018-10-21 DIAGNOSIS — Z9011 Acquired absence of right breast and nipple: Secondary | ICD-10-CM | POA: Insufficient documentation

## 2018-10-21 LAB — CBC WITH DIFFERENTIAL/PLATELET
ABS IMMATURE GRANULOCYTES: 0.06 10*3/uL (ref 0.00–0.07)
BASOS ABS: 0.1 10*3/uL (ref 0.0–0.1)
Basophils Relative: 1 %
EOS ABS: 0.4 10*3/uL (ref 0.0–0.5)
Eosinophils Relative: 4 %
HEMATOCRIT: 38.1 % (ref 36.0–46.0)
Hemoglobin: 12.3 g/dL (ref 12.0–15.0)
IMMATURE GRANULOCYTES: 1 %
Lymphocytes Relative: 26 %
Lymphs Abs: 2.2 10*3/uL (ref 0.7–4.0)
MCH: 28.1 pg (ref 26.0–34.0)
MCHC: 32.3 g/dL (ref 30.0–36.0)
MCV: 87.2 fL (ref 80.0–100.0)
MONOS PCT: 7 %
Monocytes Absolute: 0.6 10*3/uL (ref 0.1–1.0)
NEUTROS ABS: 5.3 10*3/uL (ref 1.7–7.7)
NEUTROS PCT: 61 %
NRBC: 0 % (ref 0.0–0.2)
PLATELETS: 280 10*3/uL (ref 150–400)
RBC: 4.37 MIL/uL (ref 3.87–5.11)
RDW: 13 % (ref 11.5–15.5)
WBC: 8.5 10*3/uL (ref 4.0–10.5)

## 2018-10-21 LAB — COMPREHENSIVE METABOLIC PANEL
ALBUMIN: 3.9 g/dL (ref 3.5–5.0)
ALT: 14 U/L (ref 0–44)
ANION GAP: 9 (ref 5–15)
AST: 14 U/L — AB (ref 15–41)
Alkaline Phosphatase: 88 U/L (ref 38–126)
BUN: 20 mg/dL (ref 8–23)
CALCIUM: 9 mg/dL (ref 8.9–10.3)
CHLORIDE: 105 mmol/L (ref 98–111)
CO2: 25 mmol/L (ref 22–32)
Creatinine, Ser: 1.19 mg/dL — ABNORMAL HIGH (ref 0.44–1.00)
GFR calc non Af Amer: 46 mL/min — ABNORMAL LOW (ref >60)
GFR, EST AFRICAN AMERICAN: 53 mL/min — AB (ref >60)
Glucose, Bld: 110 mg/dL — ABNORMAL HIGH (ref 70–99)
POTASSIUM: 4.2 mmol/L (ref 3.5–5.1)
Sodium: 139 mmol/L (ref 135–145)
Total Bilirubin: 0.4 mg/dL (ref 0.3–1.2)
Total Protein: 6.9 g/dL (ref 6.5–8.1)

## 2018-10-21 NOTE — Telephone Encounter (Signed)
Printed avs calender of upcoming appointment. Per 1/8 los

## 2018-10-21 NOTE — Telephone Encounter (Signed)
Printed avs and calender of upcoming appointment. Per 1/8 los

## 2018-10-22 LAB — VITAMIN D 25 HYDROXY (VIT D DEFICIENCY, FRACTURES): Vit D, 25-Hydroxy: 27.6 ng/mL — ABNORMAL LOW (ref 30.0–100.0)

## 2018-10-25 ENCOUNTER — Other Ambulatory Visit: Payer: Self-pay | Admitting: Hematology

## 2018-10-26 DIAGNOSIS — C50911 Malignant neoplasm of unspecified site of right female breast: Secondary | ICD-10-CM | POA: Diagnosis not present

## 2018-10-27 ENCOUNTER — Telehealth: Payer: Self-pay

## 2018-10-27 NOTE — Telephone Encounter (Signed)
-----   Message from [name], MD sent at 10/25/2018  8:26 PM EST ----- Please let pt know her VitD level was low last week, I recommend her to take OTC VitD 5000u daily until next visit, thanks   [name]  10/25/2018

## 2018-10-27 NOTE — Telephone Encounter (Signed)
Left voice message for patient regarding her lab results.  Per Dr. [name] Vitamin D level was low last week.  Recommended she start taking an OTC Vitamin D 5000u daily until her next visit and then we will recheck it.

## 2018-12-10 DIAGNOSIS — H2512 Age-related nuclear cataract, left eye: Secondary | ICD-10-CM | POA: Diagnosis not present

## 2018-12-10 DIAGNOSIS — H2513 Age-related nuclear cataract, bilateral: Secondary | ICD-10-CM | POA: Diagnosis not present

## 2018-12-25 ENCOUNTER — Other Ambulatory Visit: Payer: PPO | Admitting: Internal Medicine

## 2018-12-28 ENCOUNTER — Other Ambulatory Visit: Payer: PPO | Admitting: Internal Medicine

## 2018-12-28 ENCOUNTER — Other Ambulatory Visit: Payer: Self-pay

## 2018-12-28 DIAGNOSIS — I10 Essential (primary) hypertension: Secondary | ICD-10-CM

## 2018-12-28 DIAGNOSIS — C50919 Malignant neoplasm of unspecified site of unspecified female breast: Secondary | ICD-10-CM

## 2018-12-28 DIAGNOSIS — M810 Age-related osteoporosis without current pathological fracture: Secondary | ICD-10-CM

## 2018-12-28 DIAGNOSIS — Z0000 Encounter for general adult medical examination without abnormal findings: Secondary | ICD-10-CM

## 2018-12-28 DIAGNOSIS — E7849 Other hyperlipidemia: Secondary | ICD-10-CM

## 2018-12-28 DIAGNOSIS — M1711 Unilateral primary osteoarthritis, right knee: Secondary | ICD-10-CM

## 2018-12-28 DIAGNOSIS — K219 Gastro-esophageal reflux disease without esophagitis: Secondary | ICD-10-CM

## 2018-12-28 DIAGNOSIS — J45909 Unspecified asthma, uncomplicated: Secondary | ICD-10-CM | POA: Diagnosis not present

## 2018-12-28 DIAGNOSIS — F411 Generalized anxiety disorder: Secondary | ICD-10-CM

## 2018-12-28 DIAGNOSIS — F439 Reaction to severe stress, unspecified: Secondary | ICD-10-CM

## 2018-12-28 DIAGNOSIS — E039 Hypothyroidism, unspecified: Secondary | ICD-10-CM | POA: Diagnosis not present

## 2018-12-29 ENCOUNTER — Ambulatory Visit (INDEPENDENT_AMBULATORY_CARE_PROVIDER_SITE_OTHER): Payer: PPO | Admitting: Internal Medicine

## 2018-12-29 VITALS — BP 130/80 | HR 112 | Temp 98.5°F | Ht 59.0 in | Wt 130.0 lb

## 2018-12-29 DIAGNOSIS — Z96651 Presence of right artificial knee joint: Secondary | ICD-10-CM | POA: Diagnosis not present

## 2018-12-29 DIAGNOSIS — R7302 Impaired glucose tolerance (oral): Secondary | ICD-10-CM

## 2018-12-29 DIAGNOSIS — E782 Mixed hyperlipidemia: Secondary | ICD-10-CM | POA: Diagnosis not present

## 2018-12-29 DIAGNOSIS — Z853 Personal history of malignant neoplasm of breast: Secondary | ICD-10-CM | POA: Diagnosis not present

## 2018-12-29 DIAGNOSIS — Z79811 Long term (current) use of aromatase inhibitors: Secondary | ICD-10-CM

## 2018-12-29 DIAGNOSIS — F411 Generalized anxiety disorder: Secondary | ICD-10-CM

## 2018-12-29 DIAGNOSIS — E039 Hypothyroidism, unspecified: Secondary | ICD-10-CM

## 2018-12-29 DIAGNOSIS — K219 Gastro-esophageal reflux disease without esophagitis: Secondary | ICD-10-CM

## 2018-12-29 DIAGNOSIS — F439 Reaction to severe stress, unspecified: Secondary | ICD-10-CM | POA: Diagnosis not present

## 2018-12-29 DIAGNOSIS — Z0000 Encounter for general adult medical examination without abnormal findings: Secondary | ICD-10-CM

## 2018-12-29 LAB — CBC WITH DIFFERENTIAL/PLATELET
Absolute Monocytes: 675 cells/uL (ref 200–950)
Basophils Absolute: 83 cells/uL (ref 0–200)
Basophils Relative: 1.1 %
Eosinophils Absolute: 308 cells/uL (ref 15–500)
Eosinophils Relative: 4.1 %
HCT: 38.3 % (ref 35.0–45.0)
Hemoglobin: 12.4 g/dL (ref 11.7–15.5)
Lymphs Abs: 2003 cells/uL (ref 850–3900)
MCH: 27.8 pg (ref 27.0–33.0)
MCHC: 32.4 g/dL (ref 32.0–36.0)
MCV: 85.9 fL (ref 80.0–100.0)
MPV: 10.3 fL (ref 7.5–12.5)
Monocytes Relative: 9 %
Neutro Abs: 4433 cells/uL (ref 1500–7800)
Neutrophils Relative %: 59.1 %
Platelets: 299 10*3/uL (ref 140–400)
RBC: 4.46 10*6/uL (ref 3.80–5.10)
RDW: 13.1 % (ref 11.0–15.0)
Total Lymphocyte: 26.7 %
WBC: 7.5 10*3/uL (ref 3.8–10.8)

## 2018-12-29 LAB — COMPLETE METABOLIC PANEL WITH GFR
AG Ratio: 1.8 (calc) (ref 1.0–2.5)
ALT: 13 U/L (ref 6–29)
AST: 17 U/L (ref 10–35)
Albumin: 4.2 g/dL (ref 3.6–5.1)
Alkaline phosphatase (APISO): 74 U/L (ref 37–153)
BUN/Creatinine Ratio: 23 (calc) — ABNORMAL HIGH (ref 6–22)
BUN: 26 mg/dL — ABNORMAL HIGH (ref 7–25)
CO2: 26 mmol/L (ref 20–32)
Calcium: 9.5 mg/dL (ref 8.6–10.4)
Chloride: 109 mmol/L (ref 98–110)
Creat: 1.12 mg/dL — ABNORMAL HIGH (ref 0.60–0.93)
GFR, Est African American: 57 mL/min/{1.73_m2} — ABNORMAL LOW (ref >=60)
GFR, Est Non African American: 49 mL/min/{1.73_m2} — ABNORMAL LOW (ref >=60)
Globulin: 2.4 g/dL (calc) (ref 1.9–3.7)
Glucose, Bld: 102 mg/dL — ABNORMAL HIGH (ref 65–99)
Potassium: 4.7 mmol/L (ref 3.5–5.3)
Sodium: 145 mmol/L (ref 135–146)
Total Bilirubin: 0.4 mg/dL (ref 0.2–1.2)
Total Protein: 6.6 g/dL (ref 6.1–8.1)

## 2018-12-29 LAB — LIPID PANEL
Cholesterol: 163 mg/dL (ref <200)
HDL: 60 mg/dL (ref >=50)
LDL Cholesterol (Calc): 75 mg/dL (calc)
NON-HDL CHOLESTEROL (CALC): 103 mg/dL (ref <130)
Total CHOL/HDL Ratio: 2.7 (calc) (ref <5.0)
Triglycerides: 186 mg/dL — ABNORMAL HIGH (ref <150)

## 2018-12-29 LAB — POCT URINALYSIS DIPSTICK
Appearance: NEGATIVE
Bilirubin, UA: NEGATIVE
Glucose, UA: NEGATIVE
Ketones, UA: NEGATIVE
Leukocytes, UA: NEGATIVE
NITRITE UA: NEGATIVE
Odor: NEGATIVE
PH UA: 6.5 (ref 5.0–8.0)
Protein, UA: NEGATIVE
Spec Grav, UA: 1.01 (ref 1.010–1.025)
Urobilinogen, UA: 0.2 E.U./dL

## 2018-12-29 LAB — TSH: TSH: 0.59 mIU/L (ref 0.40–4.50)

## 2018-12-29 NOTE — Progress Notes (Signed)
Subjective:    Patient ID: Tara Rodgers, female    DOB: [birth year], 73 y.o.   MRN: [id number]  HPI 73 year old Female for health maintenance and evaluation of medical issues.  Has seen Dr. Layne Benton for Prolia therapy.  Had urinary tract infection October 2019.  Had total right knee arthroplasty by Dr. Theda Sers May 2019 and has residual stiffness.  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  Past medical history: She developed right carpal tunnel syndrome 1995 but it improved.  She had surgery for left carpal tunnel syndrome 2010.  Bilateral tubal ligation 1986.  Left tennis elbow release 1990.  Foot and ankle surgery 1999.  Right breast lumpectomy with axillary node dissection 1996.  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  She underwent 6 months of chemotherapy with CMF and adjuvant radiation.  She took estrogen replacement in the early 1990s.  She had a suprapubic sling March 2008.  History of perennial allergic rhinitis and vasomotor rhinitis.  Used to be followed by Dr. Donneta Romberg for allergies in 2011.  In 2001 skin testing positive for black walnut, dog, cat, house dust, dust mites, cockroach and mold as well as weeds.  Remote history of PVCs 1991 treated with verapamil which she also takes for hypertension.  Has declined colonoscopy.  Had sigmoidoscopy done in 1998 by Dr. Teena Irani.  May be a candidate for Cologuard.  She will consider it.  Social history: Does not smoke or consume alcohol except occasionally.  Has 2 children a son and a daughter.  Has been married twice.  Present husband is a Dealer and has heart issues and history of alcohol abuse.  She is retired from [employer].  Family history: Father died in 0315 of complications of COPD at age 33 with history of coronary artery  disease.  One half sister with thyroid issues.  In June 2016 she underwent a right mastectomy with reconstruction surgery for invasive ductal carcinoma right breast.  Tumor was ER positive PR positive HER-2/neu negative.  She continues to be followed by oncology and is doing well.  Tumor was classified as stage IIa and she is on Femara.      Review of Systems  Constitutional: Positive for fatigue.  Respiratory: Negative for shortness of breath.        History of allergic rhinitis  Cardiovascular: Negative for chest pain.  Gastrointestinal: Negative.   Musculoskeletal:       Right knee stiffness  Neurological: Negative.   Hematological: Negative.   Psychiatric/Behavioral:       Longstanding history of anxiety       Objective:   Physical Exam Vitals signs reviewed.  Constitutional:      General: She is not in acute distress.    Appearance: Normal appearance. She is not ill-appearing.  HENT:     Head: Normocephalic and atraumatic.     Right Ear: Tympanic membrane normal.     Left Ear: Tympanic membrane normal.     Nose: Nose normal. No congestion.     Mouth/Throat:     Mouth: Mucous membranes are moist.     Pharynx: Oropharynx is clear.  Eyes:     General: No scleral icterus.       Right eye: No discharge.        Left eye: No discharge.     Conjunctiva/sclera: Conjunctivae normal.  Pupils: Pupils are equal, round, and reactive to light.  Neck:     Musculoskeletal: Neck supple. No neck rigidity.     Vascular: No carotid bruit.     Comments: No thyromegaly Cardiovascular:     Rate and Rhythm: Normal rate and regular rhythm.     Pulses: Normal pulses.     Heart sounds: No murmur.  Pulmonary:     Effort: Pulmonary effort is normal.     Breath sounds: Normal breath sounds. No wheezing or rales.  Abdominal:     General: Bowel sounds are normal.     Palpations: Abdomen is soft. There is no mass.     Tenderness: There is no abdominal tenderness. There is no guarding  or rebound.  Musculoskeletal:     Right lower leg: No edema.     Left lower leg: No edema.  Lymphadenopathy:     Cervical: No cervical adenopathy.  Skin:    General: Skin is warm and dry.     Findings: No rash.  Neurological:     General: No focal deficit present.     Mental Status: She is alert and oriented to person, place, and time.     Cranial Nerves: No cranial nerve deficit.     Motor: No weakness.     Coordination: Coordination normal.  Psychiatric:        Mood and Affect: Mood normal.        Behavior: Behavior normal.        Thought Content: Thought content normal.        Judgment: Judgment normal.           Assessment & Plan:  History of anxiety longstanding treated with Xanax related to situational stress  Essential hypertension-stable  Right knee stiffness status post right knee arthroplasty-encourage her to work on this  Osteoporosis and was receiving Prolia by Dr. Caroleen Hamman believe patient has stopped receiving Prolia for expense reasons  GE reflux treated with PPI  Hyperlipidemia-mixed continue statin therapy and work on diet and exercise.  Hypothyroidism with normal TSH on thyroid replacement therapy  History of right breast cancer 1996 and again in 2016 followed by oncology.  Status post right breast reconstruction  Has declined colonoscopy but should consider Cologuard  Return in 6 months  Subjective:   Patient presents for Medicare Annual/Subsequent preventive examination.  Review Past Medical/Family/Social: See above   Risk Factors  Current exercise habits: Not as much exercise as she should be doing Dietary issues discussed: Low-fat low carbohydrate  Cardiac risk factors: Impaired glucose tolerance, hyperlipidemia  Depression Screen  (Note: if answer to either of the following is "Yes", a more complete depression screening is indicated)   Over the past two weeks, have you felt down, depressed or hopeless? No  Over the past two weeks,  have you felt little interest or pleasure in doing things? No Have you lost interest or pleasure in daily life? No Do you often feel hopeless? No Do you cry easily over simple problems? No   Activities of Daily Living  In your present state of health, do you have any difficulty performing the following activities?:   Driving? No  Managing money? No  Feeding yourself? No  Getting from bed to chair? No  Climbing a flight of stairs?  Yes right knee is still stiff preparing food and eating?: No  Bathing or showering? No  Getting dressed: No  Getting to the toilet? No  Using the toilet:No  Moving around from  place to place: No  In the past year have you fallen or had a near fall?:No  Are you sexually active? No  Do you have more than one partner? No   Hearing Difficulties: No  Do you often ask people to speak up or repeat themselves? No  Do you experience ringing or noises in your ears? No  Do you have difficulty understanding soft or whispered voices? No  Do you feel that you have a problem with memory? No Do you often misplace items? No    Home Safety:  Do you have a smoke alarm at your residence? Yes Do you have grab bars in the bathroom?  No Do you have throw rugs in your house?  No   Cognitive Testing  Alert? Yes Normal Appearance?Yes  Oriented to person? Yes Place? Yes  Time? Yes  Recall of three objects? Yes  Can perform simple calculations? Yes  Displays appropriate judgment?Yes  Can read the correct time from a watch face?Yes   List the Names of Other Physician/Practitioners you currently use:  See referral list for the physicians patient is currently seeing.     Review of Systems: See above   Objective:     General appearance: Appears stated age and mildly obese  Head: Normocephalic, without obvious abnormality, atraumatic  Eyes: conj clear, EOMi PEERLA  Ears: normal TM's and external ear canals both ears  Nose: Nares normal. Septum midline. Mucosa  normal. No drainage or sinus tenderness.  Throat: lips, mucosa, and tongue normal; teeth and gums normal  Neck: no adenopathy, no carotid bruit, no JVD, supple, symmetrical, trachea midline and thyroid not enlarged, symmetric, no tenderness/mass/nodules  No CVA tenderness.  Lungs: clear to auscultation bilaterally  Breasts: No masses Heart: regular rate and rhythm, S1, S2 normal, no murmur, click, rub or gallop  Abdomen: soft, non-tender; bowel sounds normal; no masses, no organomegaly  Musculoskeletal: ROM normal in all joints, no crepitus, no deformity, Normal muscle strengthen. Back  is symmetric, no curvature. Skin: Skin color, texture, turgor normal. No rashes or lesions  Lymph nodes: Cervical, supraclavicular, and axillary nodes normal.  Neurologic: CN 2 -12 Normal, Normal symmetric reflexes. Normal coordination and gait  Psych: Alert & Oriented x 3, Mood appear stable.    Assessment:    Annual wellness medicare exam   Plan:    During the course of the visit the patient was educated and counseled about appropriate screening and preventive services including:  Annual flu vaccine      Patient Instructions (the written plan) was given to the patient.  Medicare Attestation  I have personally reviewed:  The patient's medical and social history  Their use of alcohol, tobacco or illicit drugs  Their current medications and supplements  The patient's functional ability including ADLs,fall risks, home safety risks, cognitive, and hearing and visual impairment  Diet and physical activities  Evidence for depression or mood disorders  The patient's weight, height, BMI, and visual acuity have been recorded in the chart. I have made referrals, counseling, and provided education to the patient based on review of the above and I have provided the patient with a written personalized care plan for preventive services.

## 2018-12-30 ENCOUNTER — Other Ambulatory Visit: Payer: Self-pay | Admitting: Internal Medicine

## 2019-01-01 ENCOUNTER — Telehealth: Payer: Self-pay

## 2019-01-01 NOTE — Telephone Encounter (Signed)
Faxed signed order back to [facility], sent to HIM for scanning to chart.  

## 2019-01-06 ENCOUNTER — Other Ambulatory Visit: Payer: Self-pay

## 2019-01-06 MED ORDER — CYCLOBENZAPRINE HCL 10 MG PO TABS: 10.0000 mg | ORAL_TABLET | Freq: Three times a day (TID) | ORAL | 0 refills | Status: DC | PRN

## 2019-01-08 ENCOUNTER — Ambulatory Visit: Payer: PPO

## 2019-01-12 ENCOUNTER — Encounter: Payer: Self-pay | Admitting: Internal Medicine

## 2019-01-12 NOTE — Patient Instructions (Signed)
Please try to work on diet and exercise.  Consider returning to physical therapy for right knee stiffness.  Continue current medications and follow-up in 6 months.  Triglycerides are elevated.

## 2019-02-01 ENCOUNTER — Telehealth: Payer: Self-pay | Admitting: Internal Medicine

## 2019-02-01 MED ORDER — LEVOTHYROXINE SODIUM 75 MCG PO TABS: 75.0000 ug | ORAL_TABLET | Freq: Every day | ORAL | 1 refills | Status: DC

## 2019-02-01 MED ORDER — PANTOPRAZOLE SODIUM 40 MG PO TBEC: 40.0000 mg | DELAYED_RELEASE_TABLET | Freq: Every day | ORAL | 1 refills | Status: DC

## 2019-02-01 NOTE — Telephone Encounter (Signed)
Laniesha Das [phone]  [facility] [place]  pantoprazole (PROTONIX) 40 MG tablet    levothyroxine (SYNTHROID, LEVOTHROID) 75 MCG tablet   Beatriz called to say she has changed pharmacies and she needs refills on above medications and she would like them to be 90 Day supply.

## 2019-02-10 ENCOUNTER — Ambulatory Visit: Payer: PPO

## 2019-02-24 DIAGNOSIS — H2512 Age-related nuclear cataract, left eye: Secondary | ICD-10-CM | POA: Diagnosis not present

## 2019-02-25 DIAGNOSIS — H2511 Age-related nuclear cataract, right eye: Secondary | ICD-10-CM | POA: Diagnosis not present

## 2019-03-10 DIAGNOSIS — H2511 Age-related nuclear cataract, right eye: Secondary | ICD-10-CM | POA: Diagnosis not present

## 2019-03-22 ENCOUNTER — Telehealth: Payer: Self-pay | Admitting: Internal Medicine

## 2019-03-22 MED ORDER — VERAPAMIL HCL ER 240 MG PO TBCR: 240.0000 mg | EXTENDED_RELEASE_TABLET | Freq: Every day | ORAL | 1 refills | Status: DC

## 2019-03-22 NOTE — Telephone Encounter (Signed)
[name] [phone]  [facility]  verapamil (CALAN-SR) 240 MG CR tablet    Tara Rodgers has changed pharmacy, since [facility] got so high so she would like a 90 day supply called into to [facility] for abpvemedication.

## 2019-03-25 ENCOUNTER — Ambulatory Visit
Admission: RE | Admit: 2019-03-25 | Discharge: 2019-03-25 | Disposition: A | Discharge status: Home / Self Care | Payer: PPO | Source: Ambulatory Visit | Attending: Hematology | Admitting: Hematology

## 2019-03-25 ENCOUNTER — Other Ambulatory Visit: Payer: Self-pay

## 2019-03-25 DIAGNOSIS — Z1231 Encounter for screening mammogram for malignant neoplasm of breast: Secondary | ICD-10-CM | POA: Diagnosis not present

## 2019-03-25 DIAGNOSIS — C50911 Malignant neoplasm of unspecified site of right female breast: Secondary | ICD-10-CM

## 2019-04-07 ENCOUNTER — Telehealth: Payer: Self-pay | Admitting: Hematology

## 2019-04-07 NOTE — Telephone Encounter (Signed)
R/s appt per 6/23 sch message - pt aware of appt change - did not want a virtual visit.

## 2019-04-22 ENCOUNTER — Ambulatory Visit: Payer: PPO | Admitting: Hematology

## 2019-04-22 ENCOUNTER — Other Ambulatory Visit: Payer: PPO

## 2019-05-03 ENCOUNTER — Telehealth: Payer: Self-pay | Admitting: Internal Medicine

## 2019-05-03 ENCOUNTER — Other Ambulatory Visit: Payer: Self-pay

## 2019-05-03 MED ORDER — ALPRAZOLAM 0.5 MG PO TABS: 0.5000 mg | ORAL_TABLET | Freq: Two times a day (BID) | ORAL | 1 refills | Status: DC | PRN

## 2019-05-03 NOTE — Telephone Encounter (Signed)
error 

## 2019-05-03 NOTE — Telephone Encounter (Signed)
Patient is requesting a refill last refill 08/11/18. Had CPE on 12/29/18, has CPE scheduled in September.

## 2019-06-18 ENCOUNTER — Other Ambulatory Visit: Payer: Self-pay | Admitting: Internal Medicine

## 2019-06-18 DIAGNOSIS — C50911 Malignant neoplasm of unspecified site of right female breast: Secondary | ICD-10-CM

## 2019-06-24 ENCOUNTER — Other Ambulatory Visit: Payer: Self-pay

## 2019-06-24 ENCOUNTER — Other Ambulatory Visit: Payer: PPO | Admitting: Internal Medicine

## 2019-06-24 DIAGNOSIS — R7302 Impaired glucose tolerance (oral): Secondary | ICD-10-CM | POA: Diagnosis not present

## 2019-06-24 DIAGNOSIS — E039 Hypothyroidism, unspecified: Secondary | ICD-10-CM

## 2019-06-24 DIAGNOSIS — E782 Mixed hyperlipidemia: Secondary | ICD-10-CM | POA: Diagnosis not present

## 2019-06-25 LAB — HEPATIC FUNCTION PANEL
AG Ratio: 2.1 (calc) (ref 1.0–2.5)
ALT: 18 U/L (ref 6–29)
AST: 20 U/L (ref 10–35)
Albumin: 4.1 g/dL (ref 3.6–5.1)
Alkaline phosphatase (APISO): 76 U/L (ref 37–153)
Bilirubin, Direct: 0.1 mg/dL (ref 0.0–0.2)
Globulin: 2 g/dL (calc) (ref 1.9–3.7)
Indirect Bilirubin: 0.3 mg/dL (calc) (ref 0.2–1.2)
Total Bilirubin: 0.4 mg/dL (ref 0.2–1.2)
Total Protein: 6.1 g/dL (ref 6.1–8.1)

## 2019-06-25 LAB — LIPID PANEL
Cholesterol: 156 mg/dL (ref <200)
HDL: 59 mg/dL (ref >=50)
LDL Cholesterol (Calc): 70 mg/dL (calc)
Non-HDL Cholesterol (Calc): 97 mg/dL (calc) (ref <130)
Total CHOL/HDL Ratio: 2.6 (calc) (ref <5.0)
Triglycerides: 199 mg/dL — ABNORMAL HIGH (ref <150)

## 2019-06-25 LAB — TSH: TSH: 0.66 mIU/L (ref 0.40–4.50)

## 2019-06-25 LAB — HEMOGLOBIN A1C
Hgb A1c MFr Bld: 5.5 % of total Hgb (ref <5.7)
Mean Plasma Glucose: 111 (calc)
eAG (mmol/L): 6.2 (calc)

## 2019-06-28 NOTE — Progress Notes (Signed)
[name]   [phone] [phone]   Clinic Follow up Note   Patient Care Team: [name], [name] [name], MD as PCP - General (Internal Medicine) [name], MD as Consulting Physician (Hematology) [name], MD as Consulting Physician (General Surgery) [name], NP as Nurse Practitioner (Nurse Practitioner) [name], MD as Consulting Physician (Plastic Surgery) [name], MD as Consulting Physician (Orthopedic Surgery)  Date of Service:  07/01/2019  CHIEF COMPLAINT: Follow up right breast cancer  SUMMARY OF ONCOLOGIC HISTORY: Oncology History Overview Note    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) and adjuvant radiation ([name]).   12/13/2014 Mammogram   Right breast: possible asymmetry warranting further evaluation with spot compression views and possibly ultrasound   12/16/2014 Breast US   Right breast: no definitive abnormality within the superior aspect of the right breast to correspond with the mammographic finding.   01/10/2015 Breast MRI   Right breast: irregular rim enhancing mass within the superior right breast at 11:30 measuring 2.5 x 2.4 x 2.4 cm. No abnormal enhancement is identified within the skin of the superior right breast in the area of concern on mammogram.   01/12/2015 Breast US   Second look diagnostic mammogram and ultrasound showed a 2.3 cm mass in the upper midline right breast. No axillary adenopathy.   01/12/2015 Initial Biopsy   Right breast needle core bx (upper  midline): Invasive ductal carcinoma, grade 2, ER+ (100%), PR+ (77%), HER2/neu negative (ratio 1.15), Ki67 20%    01/30/2015 Procedure   Breast High/Moderate Risk panel ([facility]) reveals no clinically significant variant at ATM, BRCA1, BRCA2, CDH1, CHEK2, PALB2, PTEN, STK11, and TP53.   02/08/2015 Clinical Stage   Stage IIA (T2 N0)   03/23/2015 Definitive Surgery   Right mastectomy ([name]): invasive adenocarcinoma, grade 3, 2.9 cm, HER2/neu repeated, negative (ratio 1.23)   03/23/2015 Oncotype testing   Score: 8 (6% ROR). No chemotherapy [name]).   03/23/2015 Pathologic Stage   Stage IIA: pT2 pNx   05/04/2015 -  Anti-estrogen oral therapy   Letrozole 2.20m daily. Planned duration of therapy at least 5 years.    07/25/2015 Survivorship   Survivorship visit completed and copy of care plan provided to patient.   12/15/2015 Mammogram   IMPRESSION: No mammographic evidence of malignancy. A result letter of this screening mammogram will be mailed directly to the patient.   12/23/2016 Mammogram   IMPRESSION: No mammographic evidence of malignancy. A result letter of this screening mammogram will be mailed directly to the patient.    12/30/2017 Mammogram   12/30/2017 Mammogram IMPRESSION: No mammographic evidence of malignancy. A result letter of this screening mammogram will be mailed directly to the patient.      CURRENT THERAPY:  Letrozole 2.5 mg once daily, started on 05/04/2015  INTERVAL HISTORY:  JCLISTA RAINFORDis here for a follow up right breast cancer. She was last seen by me 8 months ago. She presents to the clinic alone. She notes she has been trying to lose weight but not successful She still  is active with walking her dog. She denies any new pain or discomfort. She is no longer doing Prolia injections for about 2 years. She notes she did not tolerate oral Alendronate so she stopped after 5 years. She notes she is tolerating letrozole. She notes she uses flexeril for any  arthritis pain she has. She notes she saw her PCP tomorrow.     REVIEW OF SYSTEMS:   Constitutional: Denies fevers, chills or abnormal weight loss Eyes: Denies blurriness of vision Ears, nose, mouth, throat, and face: Denies mucositis or sore throat Respiratory: Denies cough, dyspnea or wheezes Cardiovascular: Denies palpitation, chest discomfort or lower extremity swelling Gastrointestinal:  Denies nausea, heartburn or change in bowel habits Skin: Denies abnormal skin rashes MSK: (+) Arthritis  Lymphatics: Denies new lymphadenopathy or easy bruising Neurological:Denies numbness, tingling or new weaknesses Behavioral/Psych: Mood is stable, no new changes  All other systems were reviewed with the patient and are negative.  MEDICAL HISTORY:  Past Medical History:  Diagnosis Date  . Anxiety   . Asthma    does not take any medications, hx of inhaler use  . Breast cancer [facility]) 1995/2016   right sided breast cancer in 08/1994  . Bronchitis    hx of  . GERD (gastroesophageal reflux disease)   . History of hiatal hernia   . Hypertension   . Hyperthyroidism     SURGICAL HISTORY: Past Surgical History:  Procedure Laterality Date  . BREAST BIOPSY Right 2016   malignant  . BREAST EXCISIONAL BIOPSY Left 1996   benign  . Carpel Tunnel  Bilateral   . INCONTINENCE SURGERY     with mesh insertion about 7 years ago  . LATISSIMUS FLAP TO BREAST Right 03/23/2015   Procedure: LATISSIMUS FLAP TO BREAST WITH PLACEMENT OF IMPLANT FOR BREAST RECONSTRUCTION;  Surgeon: [name], MD;  Location: [place];  Service: Plastics;  Laterality: Right;  . MASTECTOMY Right 03/23/2015  . RECONSTRUCTION BREAST W/ LATISSIMUS DORSI FLAP Right 03/23/2015  . TONSILLECTOMY     as a child  . TOTAL KNEE ARTHROPLASTY Right 03/13/2018   Procedure: RIGHT TOTAL KNEE ARTHROPLASTY;  Surgeon: Sydnee Cabal, MD;  Location: [facility];  Service: Orthopedics;  Laterality: Right;  Adductor Block  . TOTAL MASTECTOMY Right  03/23/2015   Procedure: RIGHT TOTAL MASTECTOMY;  Surgeon: [name], MD;  Location: [place];  Service: General;  Laterality: Right;    I have reviewed the social history and family history with the patient and they are unchanged from previous note.  ALLERGIES:  is allergic to fosamax [alendronate sodium].  MEDICATIONS:  Current Outpatient Medications  Medication Sig Dispense Refill  . ALPRAZolam (XANAX) 0.5 MG tablet Take 1 tablet (0.5 mg total) by mouth 2 (two) times daily as needed. 180 tablet 1  . cyclobenzaprine (FLEXERIL) 10 MG tablet Take 1 tablet (10 mg total) by mouth 3 (three) times daily as needed for muscle spasms. 30 tablet 0  . letrozole (FEMARA) 2.5 MG tablet TAKE 1 TABLET BY MOUTH EVERY DAY 90 tablet 3  . levothyroxine (SYNTHROID) 75 MCG tablet Take 1 tablet (75 mcg total) by mouth daily. 90 tablet 1  . Lifitegrast (XIIDRA) 5 % SOLN Place 1 drop into both eyes 2 (two) times daily.    . pantoprazole (PROTONIX) 40 MG tablet Take 1 tablet (40 mg total) by mouth daily. 90 tablet 1  . simvastatin (ZOCOR) 20 MG tablet TAKE (1) TABLET DAILY AT BEDTIME. 90 tablet 3  . verapamil (CALAN-SR) 240 MG CR  tablet Take 1 tablet (240 mg total) by mouth daily. 90 tablet 1   No current facility-administered medications for this visit.     PHYSICAL EXAMINATION: ECOG PERFORMANCE STATUS: 0 - Asymptomatic  Vitals:   07/01/19 1303  BP: 134/83  Pulse: (!) 133  Resp: 18  Temp: 98.7 F (37.1 C)  SpO2: 97%   Filed Weights   07/01/19 1303  Weight: 131 lb 11.2 oz (59.7 kg)    GENERAL:alert, no distress and comfortable SKIN: skin color, texture, turgor are normal, no rashes or significant lesions EYES: normal, Conjunctiva are pink and non-injected, sclera clear  NECK: supple, thyroid normal size, non-tender, without nodularity LYMPH:  no palpable lymphadenopathy in the cervical, axillary  LUNGS: clear to auscultation and percussion with normal breathing effort HEART: regular rate &  rhythm and no murmurs and no lower extremity edema ABDOMEN:abdomen soft, non-tender and normal bowel sounds Musculoskeletal:no cyanosis of digits and no clubbing  NEURO: alert & oriented x 3 with fluent speech, no focal motor/sensory deficits BREAST: S/p right mastectomy: surgical incision healed well. No palpable mass, nodules or adenopathy bilaterally. Breast exam benign.   LABORATORY DATA:  I have reviewed the data as listed CBC Latest Ref Rng & Units 07/01/2019 12/28/2018 10/21/2018  WBC 4.0 - 10.5 K/uL 8.9 7.5 8.5  Hemoglobin 12.0 - 15.0 g/dL 12.8 12.4 12.3  Hematocrit 36.0 - 46.0 % 38.8 38.3 38.1  Platelets 150 - 400 K/uL 321 299 280     CMP Latest Ref Rng & Units 07/01/2019 06/24/2019 12/28/2018  Glucose 70 - 99 mg/dL 125(H) - 102(H)  BUN 8 - 23 mg/dL 17 - 26(H)  Creatinine 0.44 - 1.00 mg/dL 1.30(H) - 1.12(H)  Sodium 135 - 145 mmol/L 141 - 145  Potassium 3.5 - 5.1 mmol/L 4.3 - 4.7  Chloride 98 - 111 mmol/L 107 - 109  CO2 22 - 32 mmol/L 25 - 26  Calcium 8.9 - 10.3 mg/dL 9.1 - 9.5  Total Protein 6.5 - 8.1 g/dL 6.9 6.1 6.6  Total Bilirubin 0.3 - 1.2 mg/dL 0.4 0.4 0.4  Alkaline Phos 38 - 126 U/L 92 - -  AST 15 - 41 U/L _0 ALT 0 - 44 U/L _1 RADIOGRAPHIC STUDIES: I have personally reviewed the radiological images as listed and agreed with the findings in the report. No results found.   ASSESSMENT & PLAN:  Tara Rodgers is a 73 y.o. female with   1. Recurrent right breast invasive ductal carcinoma, pT2N0, stage II, grade 3, ER positive, PR positive, HER-2 negative -She was initially diagnosed in 1996 with right breast cancer. She was treated with right breast lumpectomy, adjuvant 6 months chemo with CMF and adjuvant radiation.  -She was diagnosed with cancer recurrence in 12/2014. She is s/p right mastectomy with completed resection. Oncotype showed low risk disease so I did not recommend adjuvant chemotherapy.  -She started anti-estrogen therapy with letrozole  in 04/2015, we'll continue for total of 7-10 years due to her recurrent disease. Tolerating well.  -She is clinically doing well. Lab reviewed, her CBC and CMP are within normal limits except Cr 1.3. Her physical exam and her 03/2019 left mammogram were unremarkable. There is no clinical concern for recurrence. -Continue surveillance. Next left mammogram in 03/2020 -Continue Letrozole  -F/u in 9 months  -I encouraged her to get flu shot with her PCP visit tomorrow. She is agreeable.    2. Osteopenia -She previously had Osteoporosis. She  was getting Prolia at her PCP's office Every 6 months,she stopped due to high co-pay after about 2 years. She notes she did not tolerate oral Alendronate so she stopped after 5 years -Her DEXA from 07/13/18 she improved to osteopenia with lowest T-Score of -2.2 at [facility] with fracture risk of 20% to spine and 6.4% risk of hip fracture.  -She declined bisphosphonate with Zometa injections. She has had at least 7 years of bisphosphonate and Prolia in the past, which is adequate.  -continue calcium 1 g, vitamin D at least 1000 units daily, and weight-bearing exercise distress or bone. -Next DEXA late 2021. Will continue Letrozole if her bone density does not get much worse   3. Arthritis Kennedy Bucker a right knee replacement in May 2019 -Recovered well from her surgery, continue physical therapy. -Uses Flexeril as needed   PLAN -She is clinically doing well  -Continue Letrozole  -Left mammogram in 03/2020 -lab and follow up in 9 months    No problem-specific Assessment & Plan notes found for this encounter.   Orders Placed This Encounter  Procedures  . MM Digital Diagnostic Unilat L    Standing Status:   Future    Standing Expiration Date:   06/30/2020    Order Specific Question:   Reason for Exam (SYMPTOM  OR DIAGNOSIS REQUIRED)    Answer:   screening    Order Specific Question:   Preferred imaging location?    Answer:   [facility]   All  questions were answered. The patient knows to call the clinic with any problems, questions or concerns. No barriers to learning was detected. I spent 15 minutes counseling the patient face to face. The total time spent in the appointment was 20 minutes and more than 50% was on counseling and review of test results     [name], MD 07/01/2019   I, Joslyn Devon, am acting as scribe for [name], MD.   I have reviewed the above documentation for accuracy and completeness, and I agree with the above.

## 2019-06-29 ENCOUNTER — Other Ambulatory Visit: Payer: PPO | Admitting: Internal Medicine

## 2019-07-01 ENCOUNTER — Other Ambulatory Visit: Payer: Self-pay

## 2019-07-01 ENCOUNTER — Inpatient Hospital Stay: Payer: PPO | Attending: Hematology

## 2019-07-01 ENCOUNTER — Telehealth: Payer: Self-pay | Admitting: Hematology

## 2019-07-01 ENCOUNTER — Encounter: Payer: Self-pay | Admitting: Hematology

## 2019-07-01 ENCOUNTER — Inpatient Hospital Stay (HOSPITAL_BASED_OUTPATIENT_CLINIC_OR_DEPARTMENT_OTHER): Payer: PPO | Admitting: Hematology

## 2019-07-01 VITALS — BP 134/83 | HR 133 | Temp 98.7°F | Resp 18 | Ht 59.0 in | Wt 131.7 lb

## 2019-07-01 DIAGNOSIS — M810 Age-related osteoporosis without current pathological fracture: Secondary | ICD-10-CM | POA: Diagnosis not present

## 2019-07-01 DIAGNOSIS — Z1231 Encounter for screening mammogram for malignant neoplasm of breast: Secondary | ICD-10-CM

## 2019-07-01 DIAGNOSIS — Z853 Personal history of malignant neoplasm of breast: Secondary | ICD-10-CM | POA: Diagnosis not present

## 2019-07-01 DIAGNOSIS — Z79811 Long term (current) use of aromatase inhibitors: Secondary | ICD-10-CM | POA: Diagnosis not present

## 2019-07-01 DIAGNOSIS — C50911 Malignant neoplasm of unspecified site of right female breast: Secondary | ICD-10-CM

## 2019-07-01 DIAGNOSIS — Y93K1 Activity, walking an animal: Secondary | ICD-10-CM | POA: Insufficient documentation

## 2019-07-01 DIAGNOSIS — Z170 Estrogen receptor positive status [ER+]: Secondary | ICD-10-CM | POA: Diagnosis not present

## 2019-07-01 DIAGNOSIS — F419 Anxiety disorder, unspecified: Secondary | ICD-10-CM | POA: Diagnosis not present

## 2019-07-01 DIAGNOSIS — I10 Essential (primary) hypertension: Secondary | ICD-10-CM | POA: Insufficient documentation

## 2019-07-01 DIAGNOSIS — Z79899 Other long term (current) drug therapy: Secondary | ICD-10-CM | POA: Diagnosis not present

## 2019-07-01 DIAGNOSIS — C50411 Malignant neoplasm of upper-outer quadrant of right female breast: Secondary | ICD-10-CM | POA: Diagnosis not present

## 2019-07-01 DIAGNOSIS — E785 Hyperlipidemia, unspecified: Secondary | ICD-10-CM | POA: Insufficient documentation

## 2019-07-01 DIAGNOSIS — M8580 Other specified disorders of bone density and structure, unspecified site: Secondary | ICD-10-CM | POA: Insufficient documentation

## 2019-07-01 DIAGNOSIS — K219 Gastro-esophageal reflux disease without esophagitis: Secondary | ICD-10-CM | POA: Diagnosis not present

## 2019-07-01 LAB — COMPREHENSIVE METABOLIC PANEL
ALT: 16 U/L (ref 0–44)
AST: 20 U/L (ref 15–41)
Albumin: 4 g/dL (ref 3.5–5.0)
Alkaline Phosphatase: 92 U/L (ref 38–126)
Anion gap: 9 (ref 5–15)
BUN: 17 mg/dL (ref 8–23)
CO2: 25 mmol/L (ref 22–32)
Calcium: 9.1 mg/dL (ref 8.9–10.3)
Chloride: 107 mmol/L (ref 98–111)
Creatinine, Ser: 1.3 mg/dL — ABNORMAL HIGH (ref 0.44–1.00)
GFR calc Af Amer: 47 mL/min — ABNORMAL LOW (ref >60)
GFR calc non Af Amer: 41 mL/min — ABNORMAL LOW (ref >60)
Glucose, Bld: 125 mg/dL — ABNORMAL HIGH (ref 70–99)
Potassium: 4.3 mmol/L (ref 3.5–5.1)
Sodium: 141 mmol/L (ref 135–145)
Total Bilirubin: 0.4 mg/dL (ref 0.3–1.2)
Total Protein: 6.9 g/dL (ref 6.5–8.1)

## 2019-07-01 LAB — CBC WITH DIFFERENTIAL/PLATELET
Abs Immature Granulocytes: 0.06 10*3/uL (ref 0.00–0.07)
Basophils Absolute: 0.1 10*3/uL (ref 0.0–0.1)
Basophils Relative: 1 %
Eosinophils Absolute: 0.3 10*3/uL (ref 0.0–0.5)
Eosinophils Relative: 3 %
HCT: 38.8 % (ref 36.0–46.0)
Hemoglobin: 12.8 g/dL (ref 12.0–15.0)
Immature Granulocytes: 1 %
Lymphocytes Relative: 27 %
Lymphs Abs: 2.4 10*3/uL (ref 0.7–4.0)
MCH: 28.3 pg (ref 26.0–34.0)
MCHC: 33 g/dL (ref 30.0–36.0)
MCV: 85.8 fL (ref 80.0–100.0)
Monocytes Absolute: 0.7 10*3/uL (ref 0.1–1.0)
Monocytes Relative: 8 %
Neutro Abs: 5.3 10*3/uL (ref 1.7–7.7)
Neutrophils Relative %: 60 %
Platelets: 321 10*3/uL (ref 150–400)
RBC: 4.52 MIL/uL (ref 3.87–5.11)
RDW: 13 % (ref 11.5–15.5)
WBC: 8.9 10*3/uL (ref 4.0–10.5)
nRBC: 0 % (ref 0.0–0.2)

## 2019-07-01 NOTE — Telephone Encounter (Signed)
Scheduled appt per 9/17 los.  Spoke with patient and they are aware of the appt date and time.

## 2019-07-02 ENCOUNTER — Encounter: Payer: Self-pay | Admitting: Internal Medicine

## 2019-07-02 ENCOUNTER — Ambulatory Visit (INDEPENDENT_AMBULATORY_CARE_PROVIDER_SITE_OTHER): Payer: PPO | Admitting: Internal Medicine

## 2019-07-02 VITALS — BP 140/90 | HR 112 | Temp 98.0°F | Ht 59.0 in | Wt 132.0 lb

## 2019-07-02 DIAGNOSIS — I10 Essential (primary) hypertension: Secondary | ICD-10-CM | POA: Diagnosis not present

## 2019-07-02 DIAGNOSIS — F411 Generalized anxiety disorder: Secondary | ICD-10-CM

## 2019-07-02 DIAGNOSIS — R000 Tachycardia, unspecified: Secondary | ICD-10-CM

## 2019-07-02 DIAGNOSIS — E782 Mixed hyperlipidemia: Secondary | ICD-10-CM

## 2019-07-02 DIAGNOSIS — R7989 Other specified abnormal findings of blood chemistry: Secondary | ICD-10-CM | POA: Diagnosis not present

## 2019-07-02 DIAGNOSIS — R7302 Impaired glucose tolerance (oral): Secondary | ICD-10-CM

## 2019-07-02 DIAGNOSIS — Z23 Encounter for immunization: Secondary | ICD-10-CM | POA: Diagnosis not present

## 2019-07-02 MED ORDER — SIMVASTATIN 40 MG PO TABS: 40.0000 mg | ORAL_TABLET | Freq: Every day | ORAL | 1 refills | Status: DC

## 2019-07-02 NOTE — Progress Notes (Signed)
   Subjective:    Patient ID: Tara Rodgers, female    DOB: 09/03/1946, 73 y.o.   MRN: [id number]  HPI 73 year old Female for 6 month follow up of multiple medical issues.  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  She had a flat tire today.  She presents with elevated blood pressure.  Had total right knee arthroplasty May 2019.  Urinary tract infection October 2019.  Has seen Dr. Bassett for Prolia therapy.    Initially had right breast cancer in 1996.  In June 2016 underwent right mastectomy with reconstruction surgery for invasive ductal carcinoma right breast.  Tumor was ER positive PR positive HER-2/neu negative.  Continues to be followed by oncology and doing well.  She is on Femara.  Her creatinine is elevated today at 1.30 and generally is in the 1.12-1.19 range.  Triglycerides are elevated at 199.  Hemoglobin A1c 5.5% liver panel is normal.  CBC is normal.  BP and pulse elevated. Had flat tire on way to office.    Review of Systems had cataract extractions both eyes in May     Objective:   Physical Exam  160/90 pulse 128 decreased a bit after resting.      Assessment & Plan:  Elevated BP and pulse- anxiety and stress with flat tire  Hyperlipidemia- Increase Zocor to 40 mg daily.  RTC in 2 weeks for follow up with BP pulse, serum creatinine and nurse visit. Lipid and liver in 3 months on increased dose of Zocor  For some reason her blood pressure is elevated 140/90 likely due to pulse being 112.  She will need follow-up but I think flat tire aggravated her made her anxious today.  Would like to increase lipid medication to 40 mg daily.  Continue breast cancer follow-up  Anxiety-worse today with flat tire and that may be causing tachycardia 

## 2019-07-02 NOTE — Patient Instructions (Addendum)
Increase Zocor to 40 mg daily. RTC in 2 weeks for BP and pulse follow up.  Repeat serum creatinine at that time.  Flu vaccine today.

## 2019-07-08 DIAGNOSIS — H04123 Dry eye syndrome of bilateral lacrimal glands: Secondary | ICD-10-CM | POA: Diagnosis not present

## 2019-07-16 ENCOUNTER — Other Ambulatory Visit: Payer: Self-pay

## 2019-07-16 ENCOUNTER — Ambulatory Visit (INDEPENDENT_AMBULATORY_CARE_PROVIDER_SITE_OTHER): Payer: PPO | Admitting: Internal Medicine

## 2019-07-16 VITALS — BP 140/90 | HR 121 | Temp 97.8°F | Ht 59.0 in | Wt 132.0 lb

## 2019-07-16 DIAGNOSIS — R000 Tachycardia, unspecified: Secondary | ICD-10-CM | POA: Diagnosis not present

## 2019-07-16 DIAGNOSIS — R7989 Other specified abnormal findings of blood chemistry: Secondary | ICD-10-CM

## 2019-07-16 DIAGNOSIS — N1831 Chronic kidney disease, stage 3a: Secondary | ICD-10-CM | POA: Diagnosis not present

## 2019-07-16 DIAGNOSIS — F411 Generalized anxiety disorder: Secondary | ICD-10-CM | POA: Diagnosis not present

## 2019-07-16 MED ORDER — METOPROLOL SUCCINATE ER 25 MG PO TB24: 25.0000 mg | ORAL_TABLET | Freq: Every day | ORAL | 0 refills | Status: DC

## 2019-07-16 NOTE — Progress Notes (Signed)
   Subjective:    Patient ID: Tara Rodgers, female    DOB: [birth year], 73 y.o.   MRN: [id number]  HPI she was here for 64-month recheck September 18.  At that time her blood pressure and pulse were elevated and she has had a flat tire on the way to the office.  Blood pressure was 160/90 with pulse of 128.  She was advised to return today.  At that time Zocor was increased to 40 mg daily due to elevated triglycerides of 199.    Review of Systems     Objective:   Physical Exam  Blood pressure today 140/90, pulse 121 BMI 26.66 pulse oximetry 98%.  Skin warm and dry.  Nodes none.  Chest clear.  Cardiac exam tachycardia regular rate and rhythm.  Seems anxious.      Assessment & Plan:  Anxiety  Sinus tachycardia  Chronic kidney disease stage IIIa  Plan: Patient has been prescribed Xanax twice daily.  She is also on levothyroxine but TSH is normal.  She does take a beta-blocker 25 mg daily.  This may need to be increased.  We will follow-up with her in 2 weeks and reassess.  At last visit Zocor was increased to 40 mg daily for hyperlipidemia.  Needs to take Xanax before coming to the office.  Her creatinine was slightly elevated at 1.30 when she was here September 17.  I thought she had an element of volume depletion at that time.  She underwent creatinine runs between 1.12 and 1.19.  Creatinine repeated today and is 1.17.  Likely has chronic kidney disease from years of hypertension.

## 2019-07-17 LAB — BUN/CREATININE RATIO
BUN/Creatinine Ratio: 16 (calc) (ref 6–22)
BUN: 19 mg/dL (ref 7–25)
Creat: 1.17 mg/dL — ABNORMAL HIGH (ref 0.60–0.93)
GFR, Est African American: 54 mL/min/{1.73_m2} — ABNORMAL LOW (ref >=60)
GFR, Est Non African American: 47 mL/min/{1.73_m2} — ABNORMAL LOW (ref >=60)

## 2019-07-27 ENCOUNTER — Telehealth: Payer: Self-pay | Admitting: Internal Medicine

## 2019-07-27 MED ORDER — CYCLOBENZAPRINE HCL 10 MG PO TABS: 10.0000 mg | ORAL_TABLET | Freq: Three times a day (TID) | ORAL | 2 refills | Status: DC | PRN

## 2019-07-27 NOTE — Telephone Encounter (Signed)
Received Fax RX request from  [place]  Medication - cyclobenzaprine (FLEXERIL) 10 MG tablet    Last Refill - 01/06/19  Last OV - 07/16/19  Last CPE - 12/29/18  Next OV scheduled for 10/01/19

## 2019-07-29 ENCOUNTER — Telehealth: Payer: Self-pay | Admitting: Internal Medicine

## 2019-07-29 MED ORDER — PANTOPRAZOLE SODIUM 40 MG PO TBEC: 40.0000 mg | DELAYED_RELEASE_TABLET | Freq: Every day | ORAL | 1 refills | Status: DC

## 2019-07-29 NOTE — Telephone Encounter (Signed)
Received Fax RX request from  [place]  Medication - pantoprazole (PROTONIX) 40 MG tablet / levothyroxine (SYNTHROID) 75 MCG tablet  Last Refill - 05/01/19  Last OV - 07/16/19  Last CPE -3/17-20

## 2019-07-30 ENCOUNTER — Other Ambulatory Visit: Payer: Self-pay

## 2019-07-30 ENCOUNTER — Encounter: Payer: Self-pay | Admitting: Internal Medicine

## 2019-07-30 ENCOUNTER — Ambulatory Visit (INDEPENDENT_AMBULATORY_CARE_PROVIDER_SITE_OTHER): Payer: PPO | Admitting: Internal Medicine

## 2019-07-30 VITALS — BP 130/90 | HR 108 | Temp 98.0°F | Ht 59.0 in | Wt 132.0 lb

## 2019-07-30 DIAGNOSIS — I10 Essential (primary) hypertension: Secondary | ICD-10-CM | POA: Diagnosis not present

## 2019-07-30 MED ORDER — AMLODIPINE BESYLATE 5 MG PO TABS: 5.0000 mg | ORAL_TABLET | Freq: Every day | ORAL | 1 refills | Status: DC

## 2019-07-30 NOTE — Patient Instructions (Signed)
Discontinue Calan and start amlodipine 5 mg daily. Call with BP readings in 2-3 weeks.

## 2019-07-30 NOTE — Progress Notes (Signed)
   Subjective:    Patient ID: Tara Rodgers, female    DOB: [birth year], 73 y.o.   MRN: [id number]  HPI 73 year old Female in today to follow-up on resting tachycardia. She has a history of chronic anxiety.  Recently her pulse has been elevated on office visit October 2 but that was thought to be due to a flat tire.  Pulse is still elevated today.   Review of Systems see above she is asymptomatic from the elevated pulse.     Objective:   Physical Exam  Blood pressure is 130/90 pulse 108 regular weight 132 pounds.  Pulse oximetry 98%.  Chest clear.  Cardiac exam tachycardia regular rate and rhythm.  No lower extremity edema      Assessment & Plan:  Sinus tachycardia  Essential hypertension-elevated blood pressure reading  Anxiety  Plan: She will discontinue Calan and start amlodipine 5 mg daily.  Continue beta-blocker.  Call with blood pressure readings in 2 to 3 weeks.  She may need to increase metoprolol.

## 2019-08-03 ENCOUNTER — Encounter: Payer: Self-pay | Admitting: Internal Medicine

## 2019-08-03 ENCOUNTER — Telehealth: Payer: Self-pay | Admitting: Internal Medicine

## 2019-08-03 MED ORDER — LEVOTHYROXINE SODIUM 75 MCG PO TABS: 75.0000 ug | ORAL_TABLET | Freq: Every day | ORAL | 1 refills | Status: DC

## 2019-08-03 NOTE — Telephone Encounter (Signed)
Levothyroxine refilled x 6 months

## 2019-08-03 NOTE — Telephone Encounter (Signed)
Received Fax RX request from  [place]  Medication - levothyroxine (SYNTHROID) 75 MCG tablet    Last Refill - 05/01/19  Last OV - 07/30/19  Last CPE - 12/29/18  Next appointment - 10/01/19

## 2019-08-11 ENCOUNTER — Encounter: Payer: Self-pay | Admitting: Internal Medicine

## 2019-08-11 NOTE — Patient Instructions (Signed)
Return in 2 weeks for follow-up on elevated blood pressure and tachycardia.

## 2019-08-12 ENCOUNTER — Telehealth: Payer: Self-pay | Admitting: Internal Medicine

## 2019-08-12 MED ORDER — METOPROLOL SUCCINATE ER 25 MG PO TB24: 25.0000 mg | ORAL_TABLET | Freq: Every day | ORAL | 5 refills | Status: DC

## 2019-08-12 NOTE — Telephone Encounter (Signed)
Received Fax RX request from  [place]  Medication - metoprolol succinate (TOPROL-XL) 25 MG 24 hr tablet    Last Refill - 07/16/19  Last OV - 07/30/19  Last CPE - 12/29/18

## 2019-08-13 ENCOUNTER — Telehealth: Payer: Self-pay

## 2019-08-13 NOTE — Telephone Encounter (Signed)
Called patient and gave her Dr Verlene Mayer advice to increase Toprol, she was asking questions so I had her speak with Arac[facility].

## 2019-08-13 NOTE — Telephone Encounter (Signed)
Patient called back with 2 week BP readings.   10/19 130/72 HR 102 130/72PM 102 10/20 136/74AM  HR 99 128/76PM 102 10/21 125/77am HR 107  10/22 129/67am HR 103 141/75 HR95 10/23 143/67am HR 90 146/55 HR104 10/26 137/78am HR 101 122/63 HR 102 10/28 129/83am HR 104 137/81 HR 102 10/29 115/77am HR 102 122/73PM HR 99 10/30 120/69am hr 104  She tries to take her BP at 11:am and 9:00pm at night.

## 2019-08-13 NOTE — Telephone Encounter (Signed)
Notified patient of Dr. Verlene Mayer instructions patient verbalized understanding.

## 2019-08-13 NOTE — Telephone Encounter (Signed)
Increase Toprol to 2 tabs daily in am and follow up late next week here

## 2019-08-20 ENCOUNTER — Other Ambulatory Visit: Payer: Self-pay

## 2019-08-20 ENCOUNTER — Ambulatory Visit (INDEPENDENT_AMBULATORY_CARE_PROVIDER_SITE_OTHER): Payer: PPO | Admitting: Internal Medicine

## 2019-08-20 VITALS — BP 120/80 | HR 113 | Temp 98.3°F | Ht 59.0 in | Wt 134.0 lb

## 2019-08-20 DIAGNOSIS — R000 Tachycardia, unspecified: Secondary | ICD-10-CM | POA: Diagnosis not present

## 2019-08-20 DIAGNOSIS — I10 Essential (primary) hypertension: Secondary | ICD-10-CM

## 2019-08-20 DIAGNOSIS — F411 Generalized anxiety disorder: Secondary | ICD-10-CM

## 2019-08-20 NOTE — Patient Instructions (Addendum)
Restart verapamil 240 mg 1/2 tablet daily.  Return December 18.  Continue to monitor pulse and blood pressure.

## 2019-08-20 NOTE — Progress Notes (Signed)
   Subjective:    Patient ID: Tara Rodgers, female    DOB: 09/04/1946, 73 y.o.   MRN: [id number]  HPI At last visit we increased Toprol to 50 mg daily. Still has sinus tachycardia on home readings but BP has improved.  We have been monitoring this for several weeks.  It has been persistent.  Initially thought it was due to anxiety due to having a flat tire prior to coming to the office but tachycardia has been persistent despite increasing Toprol.  Was taking Verapamil 240 mg one half tab daily but we held it for a few weeks.    Review of Systems no new complaints and not aware of tachycardia-asymptomatic     Objective:   Physical Exam 120/80 Pulse 113, weight 134 pounds. Weight 27.06 chest clear.  Cardiac exam tachycardia regular rate and rhythm normal S1 and S2 without murmurs.  No lower extremity edema.       Assessment & Plan:  Persistent tachycardia-restart Calan one half of a 1240 mg tablet daily.  Follow-up December 18.

## 2019-08-27 ENCOUNTER — Other Ambulatory Visit: Payer: Self-pay

## 2019-08-27 ENCOUNTER — Telehealth: Payer: Self-pay

## 2019-08-27 DIAGNOSIS — I10 Essential (primary) hypertension: Secondary | ICD-10-CM

## 2019-08-27 MED ORDER — METOPROLOL SUCCINATE ER 25 MG PO TB24: 25.0000 mg | ORAL_TABLET | Freq: Every day | ORAL | 0 refills | Status: DC

## 2019-08-27 MED ORDER — AMLODIPINE BESYLATE 5 MG PO TABS: 5.0000 mg | ORAL_TABLET | Freq: Every day | ORAL | 1 refills | Status: DC

## 2019-08-27 NOTE — Addendum Note (Signed)
Addended by: Mady Haagensen on: 08/27/2019 04:49 PM   Modules accepted: Orders

## 2019-08-27 NOTE — Telephone Encounter (Signed)
Patient aware.

## 2019-08-27 NOTE — Telephone Encounter (Signed)
Patient called with BP readings,  11/7 122/70AM HR100  114/66PM HR 88  11/08 110/59AM HR92  112/67PM HR94 11/09 119/71AM HR100  122/68PM HR96   11/10 129/74AM HR 90  126/68 HR93  11/11 121/64AM HR101  112/59PM HR93  11/12 119/68AM HR87  124/69AM HR89  11/13 113/66AM HR103

## 2019-08-27 NOTE — Telephone Encounter (Signed)
I don't know if this will work as well. She can try 50 mg q am in other words, 2 of the 25 mg once a day before we order this and call us next Friday

## 2019-08-27 NOTE — Telephone Encounter (Signed)
Patient is currently taking METOPROLOL 25MG  BID, she wants to know if we can send in a scrip for METOPROLOL 50MG  one po. She is requesting a 90 day supply.

## 2019-08-27 NOTE — Telephone Encounter (Signed)
Patient was notified.

## 2019-08-27 NOTE — Telephone Encounter (Signed)
Continue same meds and see her December 18th. I am pleased with BP readings

## 2019-08-27 NOTE — Addendum Note (Signed)
Addended by: Mady Haagensen on: 08/27/2019 05:21 PM   Modules accepted: Orders

## 2019-08-30 ENCOUNTER — Other Ambulatory Visit: Payer: Self-pay

## 2019-08-30 MED ORDER — METOPROLOL SUCCINATE ER 25 MG PO TB24: 25.0000 mg | ORAL_TABLET | Freq: Two times a day (BID) | ORAL | 0 refills | Status: DC

## 2019-09-12 ENCOUNTER — Encounter: Payer: Self-pay | Admitting: Internal Medicine

## 2019-09-16 ENCOUNTER — Telehealth: Payer: Self-pay | Admitting: Internal Medicine

## 2019-09-16 MED ORDER — VERAPAMIL HCL ER 240 MG PO TBCR: EXTENDED_RELEASE_TABLET | ORAL | 0 refills | Status: DC

## 2019-09-16 NOTE — Telephone Encounter (Signed)
Received [facility] request from  [place]  Medication - verapamil (CALAN-SR) 240 MG CR tablet    Last Refill - 20-May-1946  Last OV -   Last CPE -   Next Appointment -

## 2019-09-27 ENCOUNTER — Other Ambulatory Visit: Payer: Self-pay

## 2019-09-27 MED ORDER — METOPROLOL SUCCINATE ER 25 MG PO TB24: 25.0000 mg | ORAL_TABLET | Freq: Two times a day (BID) | ORAL | 11 refills | Status: DC

## 2019-10-01 ENCOUNTER — Ambulatory Visit (INDEPENDENT_AMBULATORY_CARE_PROVIDER_SITE_OTHER): Payer: PPO | Admitting: Internal Medicine

## 2019-10-01 ENCOUNTER — Encounter: Payer: Self-pay | Admitting: Internal Medicine

## 2019-10-01 ENCOUNTER — Other Ambulatory Visit: Payer: Self-pay

## 2019-10-01 DIAGNOSIS — R000 Tachycardia, unspecified: Secondary | ICD-10-CM | POA: Diagnosis not present

## 2019-10-01 NOTE — Patient Instructions (Addendum)
Increase Calan to 240 mg daily.  RTC 4 weeks.  We will have her obtain fasting labs in the near future.

## 2019-10-01 NOTE — Progress Notes (Signed)
   Subjective:    Patient ID: Tara Rodgers, female    DOB: Sep 30, 1946, 73 y.o.   MRN: [id number]  HPI  73 year old Female being followed for tachycardia.  She is asymptomatic and not having palpitations but this tachycardia has been noted for several weeks and we have been adjusting her medications to try to control it.  In early December we added back verapamil 240 mg 1/2 tablet daily.  She is now here for follow-up.  She is already on metoprolol XL 25 mg twice daily.  She also takes amlodipine 5 mg daily and levothyroxine 75 mcg daily.  She is on simvastatin and Protonix.  She takes Xanax for anxiety.  Is not on a diuretic.  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  Review of old records indicates she has a remote history of PVCs in 1991 which was treated with verapamil which she also took for hypertension.  Social history: Does not smoke or consume alcohol.  2 adult children, a son and a daughter.  Has been married twice.  Husband is a Dealer and has heart issues.  She is retired from [employer].  Family history: Father died in 2633 of complications of COPD at age 50 with history of coronary artery disease.  One half sister with thyroid issues.  Her TSH was last checked in September and was normal at 0.66.  In September her creatinine was 1.30 and had ranged from 1.12-1.19 previously.  CBC was normal.  Lipid panel showed triglycerides of 199 with normal total cholesterol and LDL cholesterol on statin medication.  Electrolytes were normal.  Patient's last EKG was a preoperative EKG prior to a knee arthroplasty  in May 2019 showing sinus tachycardia with rate 102 and no significant change from EKG 2016 and was read by Dr. Johnsie Cancel.  Review of Systems no shortness of breath chest pain or lower extremity edema.  An EKG was  performed today showing no significant change from EKG done in May 2019     Objective:   Physical Exam Blood pressure 120/70, pulse 117 and regular, temperature 98 degrees weight 134 pounds.  Skin warm and dry.  Nodes none.  Neck is supple without JVD thyromegaly or carotid bruits.  Chest is clear to auscultation.  Cardiac exam tachycardia regular rate and rhythm normal S1 and S2.  EKG obtained and results described above with no significant change from previous EKG in 2019.       Assessment & Plan:   Tachycardia-etiology unclear we are increasing failing to 240 mg daily from one half of a 240 mg tablet daily.  Follow-up in 4 weeks.  It would be a good idea for her to have some fasting labs in the near future.  If we are unable to get her tachycardia under control I will be referring her to cardiology.  She is aware of this.

## 2019-10-05 ENCOUNTER — Encounter: Payer: Self-pay | Admitting: Internal Medicine

## 2019-10-11 ENCOUNTER — Other Ambulatory Visit: Payer: PPO | Admitting: Internal Medicine

## 2019-10-11 ENCOUNTER — Other Ambulatory Visit: Payer: Self-pay

## 2019-10-11 DIAGNOSIS — R000 Tachycardia, unspecified: Secondary | ICD-10-CM | POA: Diagnosis not present

## 2019-10-11 LAB — CBC WITH DIFFERENTIAL/PLATELET
Absolute Monocytes: 569 cells/uL (ref 200–950)
Basophils Absolute: 62 cells/uL (ref 0–200)
Basophils Relative: 0.8 %
Eosinophils Absolute: 218 cells/uL (ref 15–500)
Eosinophils Relative: 2.8 %
HCT: 35.4 % (ref 35.0–45.0)
Hemoglobin: 11.9 g/dL (ref 11.7–15.5)
Lymphs Abs: 2317 cells/uL (ref 850–3900)
MCH: 28.8 pg (ref 27.0–33.0)
MCHC: 33.6 g/dL (ref 32.0–36.0)
MCV: 85.7 fL (ref 80.0–100.0)
MPV: 10.5 fL (ref 7.5–12.5)
Monocytes Relative: 7.3 %
Neutro Abs: 4633 cells/uL (ref 1500–7800)
Neutrophils Relative %: 59.4 %
Platelets: 295 10*3/uL (ref 140–400)
RBC: 4.13 10*6/uL (ref 3.80–5.10)
RDW: 13.4 % (ref 11.0–15.0)
Total Lymphocyte: 29.7 %
WBC: 7.8 10*3/uL (ref 3.8–10.8)

## 2019-10-11 LAB — COMPLETE METABOLIC PANEL WITH GFR
AG Ratio: 1.8 (calc) (ref 1.0–2.5)
ALT: 13 U/L (ref 6–29)
AST: 15 U/L (ref 10–35)
Albumin: 4.2 g/dL (ref 3.6–5.1)
Alkaline phosphatase (APISO): 88 U/L (ref 37–153)
BUN/Creatinine Ratio: 13 (calc) (ref 6–22)
BUN: 15 mg/dL (ref 7–25)
CO2: 24 mmol/L (ref 20–32)
Calcium: 9.2 mg/dL (ref 8.6–10.4)
Chloride: 106 mmol/L (ref 98–110)
Creat: 1.15 mg/dL — ABNORMAL HIGH (ref 0.60–0.93)
GFR, Est African American: 55 mL/min/{1.73_m2} — ABNORMAL LOW (ref >=60)
GFR, Est Non African American: 47 mL/min/{1.73_m2} — ABNORMAL LOW (ref >=60)
Globulin: 2.3 g/dL (calc) (ref 1.9–3.7)
Glucose, Bld: 106 mg/dL (ref 65–139)
Potassium: 4.2 mmol/L (ref 3.5–5.3)
Sodium: 141 mmol/L (ref 135–146)
Total Bilirubin: 0.3 mg/dL (ref 0.2–1.2)
Total Protein: 6.5 g/dL (ref 6.1–8.1)

## 2019-10-11 LAB — TSH: TSH: 1.73 mIU/L (ref 0.40–4.50)

## 2019-10-11 LAB — T4, FREE: Free T4: 1 ng/dL (ref 0.8–1.8)

## 2019-10-18 ENCOUNTER — Other Ambulatory Visit: Payer: Self-pay

## 2019-10-18 MED ORDER — CYCLOBENZAPRINE HCL 10 MG PO TABS: 10.0000 mg | ORAL_TABLET | Freq: Three times a day (TID) | ORAL | 2 refills | Status: AC | PRN

## 2019-10-18 MED ORDER — CYCLOBENZAPRINE HCL 10 MG PO TABS: 10.0000 mg | ORAL_TABLET | Freq: Three times a day (TID) | ORAL | 2 refills | Status: DC | PRN

## 2019-10-18 NOTE — Addendum Note (Signed)
Addended by: Mady Haagensen on: 10/18/2019 12:21 PM   Modules accepted: Orders

## 2019-10-29 ENCOUNTER — Encounter: Payer: Self-pay | Admitting: Internal Medicine

## 2019-10-29 ENCOUNTER — Other Ambulatory Visit: Payer: Self-pay

## 2019-10-29 ENCOUNTER — Ambulatory Visit (INDEPENDENT_AMBULATORY_CARE_PROVIDER_SITE_OTHER): Payer: PPO | Admitting: Internal Medicine

## 2019-10-29 VITALS — HR 111

## 2019-10-29 DIAGNOSIS — R000 Tachycardia, unspecified: Secondary | ICD-10-CM

## 2019-10-29 DIAGNOSIS — Z20822 Contact with and (suspected) exposure to covid-19: Secondary | ICD-10-CM

## 2019-10-29 DIAGNOSIS — R05 Cough: Secondary | ICD-10-CM | POA: Diagnosis not present

## 2019-10-29 DIAGNOSIS — R059 Cough, unspecified: Secondary | ICD-10-CM

## 2019-10-29 MED ORDER — HYDROCODONE-HOMATROPINE 5-1.5 MG/5ML PO SYRP: 5.0000 mL | ORAL_SOLUTION | Freq: Three times a day (TID) | ORAL | 0 refills | Status: DC | PRN

## 2019-10-29 MED ORDER — METOPROLOL SUCCINATE ER 25 MG PO TB24: 25.0000 mg | ORAL_TABLET | Freq: Two times a day (BID) | ORAL | 1 refills | Status: DC

## 2019-10-29 MED ORDER — AZITHROMYCIN 250 MG PO TABS: ORAL_TABLET | ORAL | 0 refills | Status: DC

## 2019-10-29 NOTE — Progress Notes (Addendum)
   Subjective:    Patient ID: Tara Rodgers, female    DOB: 1945/11/29, 74 y.o.   MRN: [id number]  HPI  74 year old Female in today for follow up on sinus tachycardia.    At the check-in desk, patient admitted to having a cough but no fever or shaking chills.  She goes to the grocery store but has not been under COVID-19 exposure.  Husband however works as a Dealer.    Review of Systems has had productive cough which she thinks is her usual chest congestion.  No fever chills dysgeusia or scratchy throat.     Objective:   Physical Exam Pulse is 111 and regular- other vital signs were not taken.  She was seen in waiting room wearing a mask by myself wearing  protective equipment.  COVID-19 test was performed.  She was hard to be coughing.       Assessment & Plan:  Cough rule out COVID-19 infection.  Nasal swab obtained for COVID-19.  She is to quarantine until results are back.  She will be treated with Zithromax Z-PAK 2 p.o. day 1 followed by 1 p.o. days 2 through 5.  Hycodan 1 teaspoon p.o. every 8 hours as needed cough.  Sinus tachycardia-despite increasing Calan , she is still having issues with sinus tachycardia but basically is asymptomatic.  Referred to cardiology for evaluation.  Having to send Hycodan to another pharmacy as requested pharmacy is out

## 2019-10-29 NOTE — Patient Instructions (Signed)
COVID-19 test pending.  Referred to cardiology for evaluation of persistent  sinus tachycardia

## 2019-10-29 NOTE — Telephone Encounter (Signed)
Hycodan not available at [facility] so Rx sent to [facility] instead and Rx at [facility] cancelled by [facility]

## 2019-10-31 ENCOUNTER — Telehealth: Payer: Self-pay | Admitting: Internal Medicine

## 2019-10-31 ENCOUNTER — Encounter: Payer: Self-pay | Admitting: Internal Medicine

## 2019-10-31 ENCOUNTER — Telehealth (INDEPENDENT_AMBULATORY_CARE_PROVIDER_SITE_OTHER): Payer: PPO | Admitting: Internal Medicine

## 2019-10-31 DIAGNOSIS — U071 COVID-19: Secondary | ICD-10-CM

## 2019-10-31 LAB — SARS-COV-2 RNA,(COVID-19) QUALITATIVE NAAT: SARS CoV2 RNA: DETECTED — AB

## 2019-10-31 MED ORDER — PREDNISONE 10 MG PO TABS: ORAL_TABLET | ORAL | 0 refills | Status: DC

## 2019-10-31 NOTE — Telephone Encounter (Signed)
Sending in prednisone dose pack. Will call in am to see if patient a candidate for infusion clinic. Hx breast cancer but recovered. Age would qualify her. Has HTN.

## 2019-10-31 NOTE — Telephone Encounter (Signed)
Patient is positive for Covid-19. Husband needs to be tested and they both need to quarantine. She needs to quarantine 10 days. Has pulse ox and says she is not tachypneic or febrile but still has cough. Says pulse ox is WNL. See if she is a candidate for infusion clinic.

## 2019-10-31 NOTE — Telephone Encounter (Signed)
Plan to call infusion center in am to see if patient a candidate. Has HTN, remote history of breast cancer and is 74 years old.

## 2019-11-01 ENCOUNTER — Telehealth: Payer: Self-pay | Admitting: Internal Medicine

## 2019-11-01 ENCOUNTER — Telehealth: Payer: Self-pay | Admitting: Physician Assistant

## 2019-11-01 NOTE — Telephone Encounter (Signed)
  I connected by phone with Tara Rodgers on 11/01/2019 at 3:55 PM to discuss the potential use of an new treatment for mild to moderate COVID-19 viral infection in non-hospitalized patients.  This patient is a 74 y.o. female that meets the FDA criteria for Emergency Use Authorization of bamlanivimab or casirivimab\imdevimab.  Has a (+) direct SARS-CoV-2 viral test result  Has mild or moderate COVID-19   Is ? 74 years of age and weighs ? 40 kg  Is NOT hospitalized due to COVID-19  Is NOT requiring oxygen therapy or requiring an increase in baseline oxygen flow rate due to COVID-19  Is within 10 days of symptom onset  Has at least one of the high risk factor(s) for progression to severe COVID-19 and/or hospitalization as defined in EUA.  Specific high risk criteria : >/= 73 yo   I have spoken and communicated the following to the patient or parent/caregiver:  1. FDA has authorized the emergency use of bamlanivimab and casirivimab\imdevimab for the treatment of mild to moderate COVID-19 in adults and pediatric patients with positive results of direct SARS-CoV-2 viral testing who are 66 years of age and older weighing at least 40 kg, and who are at high risk for progressing to severe COVID-19 and/or hospitalization.  2. The significant known and potential risks and benefits of bamlanivimab and casirivimab\imdevimab, and the extent to which such potential risks and benefits are unknown.  3. Information on available alternative treatments and the risks and benefits of those alternatives, including clinical trials.  4. Patients treated with bamlanivimab and casirivimab\imdevimab should continue to self-isolate and use infection control measures (e.g., wear mask, isolate, social distance, avoid sharing personal items, clean and disinfect "high touch" surfaces, and frequent handwashing) according to CDC guidelines.   5. The patient or parent/caregiver has the option to accept or refuse  bamlanivimab or casirivimab\imdevimab .  After reviewing this information with the patient, The patient agreed to proceed with receiving the bamlanimivab infusion and will be provided a copy of the Fact sheet prior to receiving the infusion.   The patient set up for infusion 11/03/19 @ 4:30pm. Directions given.   Angelena Form PA-C 11/01/2019 3:55 PM  CC: Dr. Renold Genta.

## 2019-11-01 NOTE — Telephone Encounter (Signed)
LVM at the [facility] to see if patient is qualifies for an infusion.

## 2019-11-01 NOTE — Telephone Encounter (Signed)
Faxed positive COVID-19 results to [facility]  Department

## 2019-11-01 NOTE — Telephone Encounter (Signed)
Patient has appointment scheduled for infusion on Wednesday.

## 2019-11-03 ENCOUNTER — Ambulatory Visit (HOSPITAL_COMMUNITY)
Admission: RE | Admit: 2019-11-03 | Discharge: 2019-11-03 | Disposition: A | Discharge status: Home / Self Care | Payer: Medicare Other | Source: Ambulatory Visit | Attending: Pulmonary Disease | Admitting: Pulmonary Disease

## 2019-11-03 ENCOUNTER — Telehealth: Payer: Self-pay | Admitting: Internal Medicine

## 2019-11-03 ENCOUNTER — Other Ambulatory Visit: Payer: Self-pay | Admitting: Adult Health

## 2019-11-03 DIAGNOSIS — U071 COVID-19: Secondary | ICD-10-CM | POA: Diagnosis present

## 2019-11-03 DIAGNOSIS — Z23 Encounter for immunization: Secondary | ICD-10-CM | POA: Insufficient documentation

## 2019-11-03 MED ORDER — ALBUTEROL SULFATE HFA 108 (90 BASE) MCG/ACT IN AERS: 2.0000 | INHALATION_SPRAY | Freq: Once | RESPIRATORY_TRACT | Status: DC | PRN

## 2019-11-03 MED ORDER — SODIUM CHLORIDE 0.9 % IV SOLN
INTRAVENOUS | Status: DC | PRN
  Administered 2019-11-03: 250 mL via INTRAVENOUS

## 2019-11-03 MED ORDER — SODIUM CHLORIDE 0.9 % IV SOLN
700.0000 mg | Freq: Once | INTRAVENOUS | Status: AC
  Administered 2019-11-03: 700 mg via INTRAVENOUS
  Filled 2019-11-03: qty 20

## 2019-11-03 MED ORDER — EPINEPHRINE 0.3 MG/0.3ML IJ SOAJ: 0.3000 mg | Freq: Once | INTRAMUSCULAR | Status: DC | PRN

## 2019-11-03 MED ORDER — DIPHENHYDRAMINE HCL 50 MG/ML IJ SOLN: 50.0000 mg | Freq: Once | INTRAMUSCULAR | Status: DC | PRN

## 2019-11-03 MED ORDER — METHYLPREDNISOLONE SODIUM SUCC 125 MG IJ SOLR: 125.0000 mg | Freq: Once | INTRAMUSCULAR | Status: DC | PRN

## 2019-11-03 MED ORDER — FAMOTIDINE IN NACL 20-0.9 MG/50ML-% IV SOLN: 20.0000 mg | Freq: Once | INTRAVENOUS | Status: DC | PRN

## 2019-11-03 NOTE — Discharge Instructions (Signed)
What types of side effects do monoclonal antibody drugs cause?  °Common side effects ° °In general, the more common side effects caused by monoclonal antibody drugs include: °• Allergic reactions, such as hives or itching °• Flu-like signs and symptoms, including chills, fatigue, fever, and muscle aches and pains °• Nausea, vomiting °• Diarrhea °• Skin rashes °• Low blood pressure ° ° °The CDC is recommending patients who receive monoclonal antibody treatments wait at least 90 days before being vaccinated. ° °Currently, there are no data on the safety and efficacy of mRNA COVID-19 vaccines in persons who received monoclonal antibodies or convalescent plasma as part of COVID-19 treatment. Based on the estimated half-life of such therapies as well as evidence suggesting that reinfection is uncommon in the 90 days after initial infection, vaccination should be deferred for at least 90 days, as a precautionary measure until additional information becomes available, to avoid interference of the antibody treatment with vaccine-induced immune responses. ° ° °10 Things You Can Do to Manage Your COVID-19 Symptoms at Home °If you have possible or confirmed COVID-19: °1. Stay home from work and school. And stay away from other public places. If you must go out, avoid using any kind of public transportation, ridesharing, or taxis. °2. Monitor your symptoms carefully. If your symptoms get worse, call your healthcare provider immediately. °3. Get rest and stay hydrated. °4. If you have a medical appointment, call the healthcare provider ahead of time and tell them that you have or may have COVID-19. °5. For medical emergencies, call 911 and notify the dispatch personnel that you have or may have COVID-19. °6. Cover your cough and sneezes with a tissue or use the inside of your elbow. °7. Wash your hands often with soap and water for at least 20 seconds or clean your hands with an alcohol-based hand sanitizer that contains at  least 60% alcohol. °8. As much as possible, stay in a specific room and away from other people in your home. Also, you should use a separate bathroom, if available. If you need to be around other people in or outside of the home, wear a mask. °9. Avoid sharing personal items with other people in your household, like dishes, towels, and bedding. °10. Clean all surfaces that are touched often, like counters, tabletops, and doorknobs. Use household cleaning sprays or wipes according to the label instructions. °cdc.gov/coronavirus °04/14/2019 °This information is not intended to replace advice given to you by your health care provider. Make sure you discuss any questions you have with your health care provider. °Document Revised: 09/16/2019 Document Reviewed: 09/16/2019 °Elsevier Patient Education © 2020 Elsevier Inc. °What types of side effects do monoclonal antibody drugs cause?  °Common side effects ° °In general, the more common side effects caused by monoclonal antibody drugs include: °• Allergic reactions, such as hives or itching °• Flu-like signs and symptoms, including chills, fatigue, fever, and muscle aches and pains °• Nausea, vomiting °• Diarrhea °• Skin rashes °• Low blood pressure ° ° °The CDC is recommending patients who receive monoclonal antibody treatments wait at least 90 days before being vaccinated. ° °Currently, there are no data on the safety and efficacy of mRNA COVID-19 vaccines in persons who received monoclonal antibodies or convalescent plasma as part of COVID-19 treatment. Based on the estimated half-life of such therapies as well as evidence suggesting that reinfection is uncommon in the 90 days after initial infection, vaccination should be deferred for at least 90 days, as a precautionary measure until   additional information becomes available, to avoid interference of the antibody treatment with vaccine-induced immune responses. °

## 2019-11-03 NOTE — Telephone Encounter (Signed)
Patient is at Infusion center about to get Covid-19 treatment. Resting pulse ox is 95% she says. She is coughing a lot and sounds a bit SOB. Told her to call us tomorrow with progress report.

## 2019-11-03 NOTE — Progress Notes (Signed)
  Diagnosis: COVID-19  Physician: Dr. Joya Gaskins  Procedure: Covid Infusion Clinic Med: bamlanivimab infusion - Provided patient with bamlanimivab fact sheet for patients, parents and caregivers prior to infusion.  Complications: No immediate complications noted.  Discharge: Discharged home   Babs Sciara 11/03/2019

## 2019-11-03 NOTE — Progress Notes (Signed)
  Diagnosis: COVID-19  Physician: Dr. Joya Gaskins  Procedure: Covid Infusion Clinic Med: bamlanivimab infusion - Provided patient with bamlanimivab fact sheet for patients, parents and caregivers prior to infusion.  Complications: No immediate complications noted.  Discharge: Discharged home   Tara Rodgers, Tara Rodgers 11/03/2019

## 2019-11-03 NOTE — Progress Notes (Signed)
See telephone note from 11/01/19 regarding Bamlanivimab from [employer] PA-C

## 2019-11-04 NOTE — Telephone Encounter (Signed)
Tara Rodgers called to say she is feeling a little better, still has really bad cough.

## 2019-11-09 NOTE — Telephone Encounter (Signed)
Called to check on patient she is feeling much better, still weak and has runny nose. She had been for her infusion, said it mad a difference, she feels like she is on road to recover.

## 2019-11-16 ENCOUNTER — Encounter: Payer: Self-pay | Admitting: Cardiovascular Disease

## 2019-11-16 ENCOUNTER — Other Ambulatory Visit: Payer: Self-pay

## 2019-11-16 ENCOUNTER — Ambulatory Visit (INDEPENDENT_AMBULATORY_CARE_PROVIDER_SITE_OTHER): Payer: PPO | Admitting: Cardiovascular Disease

## 2019-11-16 DIAGNOSIS — E782 Mixed hyperlipidemia: Secondary | ICD-10-CM | POA: Diagnosis not present

## 2019-11-16 DIAGNOSIS — R0989 Other specified symptoms and signs involving the circulatory and respiratory systems: Secondary | ICD-10-CM | POA: Diagnosis not present

## 2019-11-16 DIAGNOSIS — R000 Tachycardia, unspecified: Secondary | ICD-10-CM | POA: Diagnosis not present

## 2019-11-16 DIAGNOSIS — I10 Essential (primary) hypertension: Secondary | ICD-10-CM | POA: Diagnosis not present

## 2019-11-16 MED ORDER — METOPROLOL SUCCINATE ER 50 MG PO TB24: 50.0000 mg | ORAL_TABLET | Freq: Two times a day (BID) | ORAL | 3 refills | Status: DC

## 2019-11-16 NOTE — Progress Notes (Signed)
11/16/2019 ALIXIS HARMON   August 16, 1946  [id number]  Primary Physician [name], [name] [name], MD Primary Cardiologist: Lorretta Harp MD Lupe Carney, [place]  HPI:  Tara Rodgers is a 74 y.o. moderately overweight married Caucasian female mother of 2, grandmother of 3 grandchildren referred by Dr. Renold Genta for evaluation of asymptomatic sinus tachycardia.  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  She is never smoked.  She does have hypertension hyperlipidemia.  She is never had a heart attack or stroke.  She is had sinus tachycardia which has been asymptomatic on beta-blocker and calcium channel blocker.   Current Meds  Medication Sig  . ALPRAZolam (XANAX) 0.5 MG tablet Take 1 tablet (0.5 mg total) by mouth 2 (two) times daily as needed.  Marland Kitchen amLODipine (NORVASC) 5 MG tablet Take 1 tablet (5 mg total) by mouth daily.  . cyclobenzaprine (FLEXERIL) 10 MG tablet Take 1 tablet (10 mg total) by mouth 3 (three) times daily as needed for muscle spasms.  Marland Kitchen letrozole (FEMARA) 2.5 MG tablet TAKE 1 TABLET BY MOUTH EVERY DAY  . levothyroxine (SYNTHROID) 75 MCG tablet Take 1 tablet (75 mcg total) by mouth daily.  Marland Kitchen Lifitegrast (XIIDRA) 5 % SOLN Place 1 drop into both eyes 2 (two) times daily.  . metoprolol succinate (TOPROL-XL) 25 MG 24 hr tablet Take 1 tablet (25 mg total) by mouth 2 (two) times daily.  . pantoprazole (PROTONIX) 40 MG tablet Take 1 tablet (40 mg total) by mouth daily.  . simvastatin (ZOCOR) 40 MG tablet Take 1 tablet (40 mg total) by mouth at bedtime.  . verapamil (CALAN-SR) 240 MG CR tablet Take 1/2 a tablet daily     Allergies  Allergen Reactions  . Fosamax [Alendronate Sodium] Other (See Comments)    Aching all over    Social History   Socioeconomic History  . Marital status: Married    Spouse name: Not on file  . Number of children: 2  . Years of education: Not on file  . Highest education level: Not on file  Occupational History  . Not on  file  Tobacco Use  . Smoking status: Never Smoker  . Smokeless tobacco: Never Used  Substance and Sexual Activity  . Alcohol use: Yes    Comment: occasional  . Drug use: No  . Sexual activity: Not on file  Other Topics Concern  . Not on file  Social History Narrative  . Not on file   Social Determinants of Health   Financial Resource Strain:   . Difficulty of Paying Living Expenses: Not on file  Food Insecurity:   . Worried About Charity fundraiser in the Last Year: Not on file  . Ran Out of Food in the Last Year: Not on file  Transportation Needs:   . Lack of Transportation (Medical): Not on file  . Lack of Transportation (Non-Medical): Not on file  Physical Activity:   . Days of Exercise per Week: Not on file  . Minutes of Exercise per Session: Not on file  Stress:   . Feeling of Stress : Not on file  Social Connections:   . Frequency of Communication with Friends and Family: Not on file  . Frequency of Social Gatherings with Friends and Family: Not on file  . Attends Religious Services: Not on file  . Active Member of Clubs or Organizations: Not on file  . Attends Archivist Meetings: Not on file  .  Marital Status: Not on file  Intimate Partner Violence:   . Fear of Current or Ex-Partner: Not on file  . Emotionally Abused: Not on file  . Physically Abused: Not on file  . Sexually Abused: Not on file     Review of Systems: General: negative for chills, fever, night sweats or weight changes.  Cardiovascular: negative for chest pain, dyspnea on exertion, edema, orthopnea, palpitations, paroxysmal nocturnal dyspnea or shortness of breath Dermatological: negative for rash Respiratory: negative for cough or wheezing Urologic: negative for hematuria Abdominal: negative for nausea, vomiting, diarrhea, bright red blood per rectum, melena, or hematemesis Neurologic: negative for visual changes, syncope, or dizziness All other systems reviewed and are otherwise  negative except as noted above.    Blood pressure 104/67, pulse 96, height 4\' 11"  (1.499 m), weight 133 lb (60.3 kg), SpO2 97 %.  General appearance: alert and no distress Neck: no adenopathy, no JVD, supple, symmetrical, trachea midline, thyroid not enlarged, symmetric, no tenderness/mass/nodules and High-pitched left carotid bruit Lungs: clear to auscultation bilaterally Heart: regular rate and rhythm, S1, S2 normal, no murmur, click, rub or gallop Extremities: extremities normal, atraumatic, no cyanosis or edema Pulses: 2+ and symmetric Skin: Skin color, texture, turgor normal. No rashes or lesions Neurologic: Alert and oriented X 3, normal strength and tone. Normal symmetric reflexes. Normal coordination and gait  EKG not performed today  ASSESSMENT AND PLAN:   Left carotid bruit Will order carotid Doppler studies  Sinus tachycardia TSH normal, asymptomatic on beta-blocker and calcium channel blocker.  Hypertension History of essential hypertension with blood pressure measured today at 104/67.  She is on amlodipine metoprolol and verapamil.  I am going to discontinue the amlodipine and increase her metoprolol dosing.  Hyperlipidemia History of hyperlipidemia on statin therapy with lipid profile performed 06/24/2019 revealing total cholesterol 156, LDL 70 and HDL 59.      Lorretta Harp MD [facility], [facility] 11/16/2019 2:24 PM

## 2019-11-16 NOTE — Assessment & Plan Note (Signed)
TSH normal, asymptomatic on beta-blocker and calcium channel blocker.

## 2019-11-16 NOTE — Assessment & Plan Note (Signed)
Will order carotid Doppler studies

## 2019-11-16 NOTE — Assessment & Plan Note (Signed)
History of essential hypertension with blood pressure measured today at 104/67.  She is on amlodipine metoprolol and verapamil.  I am going to discontinue the amlodipine and increase her metoprolol dosing.

## 2019-11-16 NOTE — Assessment & Plan Note (Signed)
History of hyperlipidemia on statin therapy with lipid profile performed 06/24/2019 revealing total cholesterol 156, LDL 70 and HDL 59.

## 2019-11-16 NOTE — Patient Instructions (Signed)
Medication Instructions:  Stop taking Amlodipine. Increase Metoprolol to 50mg  Twice Daily  If you need a refill on your cardiac medications before your next appointment, please call your pharmacy.   Lab work: NONE  Testing/Procedures: Your physician has requested that you have a carotid duplex. This test is an ultrasound of the carotid arteries in your neck. It looks at blood flow through these arteries that supply the brain with blood. Allow one hour for this exam. There are no restrictions or special instructions.  Follow-Up: At [facility], you and your health needs are our priority.  As part of our continuing mission to provide you with exceptional heart care, we have created designated Provider Care Teams.  These Care Teams include your primary Cardiologist (physician) and Advanced Practice Providers (APPs -  Physician Assistants and Nurse Practitioners) who all work together to provide you with the care you need, when you need it. You may see Dr. Gwenlyn Found or one of the following Advanced Practice Providers on your designated Care Team:    Kerin Ransom, PA-C  [place], [place]  Coletta Memos, [place]  Your physician wants you to follow-up: as needed

## 2019-11-17 ENCOUNTER — Ambulatory Visit (HOSPITAL_COMMUNITY)
Admission: RE | Admit: 2019-11-17 | Discharge: 2019-11-17 | Disposition: A | Discharge status: Home / Self Care | Payer: PPO | Source: Ambulatory Visit | Attending: Cardiology | Admitting: Cardiology

## 2019-11-17 DIAGNOSIS — R0989 Other specified symptoms and signs involving the circulatory and respiratory systems: Secondary | ICD-10-CM | POA: Insufficient documentation

## 2019-12-14 ENCOUNTER — Other Ambulatory Visit: Payer: Self-pay | Admitting: Internal Medicine

## 2019-12-14 MED ORDER — ALPRAZOLAM 0.5 MG PO TABS: 0.5000 mg | ORAL_TABLET | Freq: Two times a day (BID) | ORAL | 1 refills | Status: DC | PRN

## 2019-12-14 NOTE — Telephone Encounter (Signed)
Joshlynn Alfonzo [phone]  Opal Sidles called to say she needs a new prescription sent to the below pharmacy, they are cheaper than the [facility] she was using.    ALPRAZolam (XANAX) 0.5 MG tablet  90 day supply  [place] ([place]), [place] - [address] [phone] (Phone) [phone] (Fax)

## 2019-12-28 ENCOUNTER — Telehealth: Payer: Self-pay | Admitting: Internal Medicine

## 2019-12-28 MED ORDER — SIMVASTATIN 40 MG PO TABS: 40.0000 mg | ORAL_TABLET | Freq: Every day | ORAL | 1 refills | Status: DC

## 2019-12-28 NOTE — Telephone Encounter (Signed)
Tara Rodgers called to say she is changing pharmacies and needs a prescription for below medication sent to new pharmacy.  simvastatin (ZOCOR) 40 MG tablet  [place] ([place]), [place] - [address] Phone:  [phone]  Fax:  [phone]

## 2019-12-28 NOTE — Addendum Note (Signed)
Addended by: Mady Haagensen on: 12/28/2019 01:26 PM   Modules accepted: Orders

## 2019-12-28 NOTE — Telephone Encounter (Signed)
Received Fax RX request from  [place], [place] Phone:  [phone]  Fax:  [phone]       Medication - simvastatin (ZOCOR) 40 MG tablet   Last Refill - 09/30/19  Last OV - 10/29/19  Last CPE - 12/29/18  Next Appointment -

## 2020-01-03 ENCOUNTER — Other Ambulatory Visit: Payer: Self-pay

## 2020-01-20 ENCOUNTER — Telehealth: Payer: Self-pay | Admitting: Internal Medicine

## 2020-01-20 MED ORDER — PANTOPRAZOLE SODIUM 40 MG PO TBEC: 40.0000 mg | DELAYED_RELEASE_TABLET | Freq: Every day | ORAL | 3 refills | Status: DC

## 2020-01-20 NOTE — Telephone Encounter (Signed)
See above

## 2020-01-20 NOTE — Telephone Encounter (Signed)
Received Fax RX request from  [place], [place] Phone:  [phone]  Fax:  [phone]       Medication - pantoprazole (PROTONIX) 40 MG tablet   Last Refill - 10/25/19  Last OV - 10/29/19  Last CPE - 12/29/18  Next Appointment -

## 2020-01-20 NOTE — Telephone Encounter (Signed)
LVM to Call office and schedule CPE and fasting labs that was due after 12/29/2019

## 2020-01-20 NOTE — Telephone Encounter (Signed)
Approve for one year

## 2020-01-20 NOTE — Telephone Encounter (Signed)
Talked with patient and she wants refill to go to [place] ([place]), [place] - [address] Phone:  [phone]  Fax:  [phone]

## 2020-01-20 NOTE — Telephone Encounter (Signed)
Patient called back and scheduled appointment

## 2020-01-24 DIAGNOSIS — H43813 Vitreous degeneration, bilateral: Secondary | ICD-10-CM | POA: Diagnosis not present

## 2020-02-03 ENCOUNTER — Ambulatory Visit: Payer: PPO | Attending: Internal Medicine

## 2020-02-03 DIAGNOSIS — Z23 Encounter for immunization: Secondary | ICD-10-CM

## 2020-02-03 NOTE — Progress Notes (Signed)
   Covid-19 Vaccination Clinic  Name:  Tara Rodgers    MRN: [id number] DOB: 05/31/1946  02/03/2020  [name] was observed post Covid-19 immunization for 15 minutes without incident. She was provided with Vaccine Information Sheet and instruction to access the V-Safe system.   [name] was instructed to call 911 with any severe reactions post vaccine: Marland Kitchen Difficulty breathing  . Swelling of face and throat  . A fast heartbeat  . A bad rash all over body  . Dizziness and weakness   Immunizations Administered    Name Date Dose VIS Date Route   Pfizer COVID-19 Vaccine 02/03/2020  1:48 PM 0.3 mL 12/08/2018 Intramuscular   Manufacturer: [place]   Lot: [id number]   [place]: [id number]

## 2020-02-11 ENCOUNTER — Other Ambulatory Visit: Payer: Self-pay | Admitting: Internal Medicine

## 2020-02-11 MED ORDER — LEVOTHYROXINE SODIUM 75 MCG PO TABS: 75.0000 ug | ORAL_TABLET | Freq: Every day | ORAL | 0 refills | Status: DC

## 2020-02-11 NOTE — Telephone Encounter (Signed)
Refill x 90 days 

## 2020-02-11 NOTE — Telephone Encounter (Signed)
Received Fax RX request from  [place] ([place]), [place] - [address] Phone:  [phone]  Fax:  [phone]       Medication - levothyroxine (SYNTHROID) 75 MCG tablet   Last Refill - 11/06/19  Last OV - 10/29/19  Last CPE - 12/29/18  Next Appointment - 02/18/20

## 2020-02-14 ENCOUNTER — Other Ambulatory Visit: Payer: Self-pay

## 2020-02-14 ENCOUNTER — Other Ambulatory Visit: Payer: PPO | Admitting: Internal Medicine

## 2020-02-14 DIAGNOSIS — E781 Pure hyperglyceridemia: Secondary | ICD-10-CM

## 2020-02-14 DIAGNOSIS — E039 Hypothyroidism, unspecified: Secondary | ICD-10-CM

## 2020-02-14 DIAGNOSIS — I10 Essential (primary) hypertension: Secondary | ICD-10-CM | POA: Diagnosis not present

## 2020-02-14 DIAGNOSIS — F411 Generalized anxiety disorder: Secondary | ICD-10-CM | POA: Diagnosis not present

## 2020-02-14 DIAGNOSIS — Z0000 Encounter for general adult medical examination without abnormal findings: Secondary | ICD-10-CM

## 2020-02-14 DIAGNOSIS — N1831 Chronic kidney disease, stage 3a: Secondary | ICD-10-CM

## 2020-02-14 DIAGNOSIS — E782 Mixed hyperlipidemia: Secondary | ICD-10-CM | POA: Diagnosis not present

## 2020-02-14 DIAGNOSIS — R7302 Impaired glucose tolerance (oral): Secondary | ICD-10-CM

## 2020-02-15 LAB — CBC WITH DIFFERENTIAL/PLATELET
Absolute Monocytes: 585 cells/uL (ref 200–950)
Basophils Absolute: 59 cells/uL (ref 0–200)
Basophils Relative: 0.8 %
Eosinophils Absolute: 222 cells/uL (ref 15–500)
Eosinophils Relative: 3 %
HCT: 36.4 % (ref 35.0–45.0)
Hemoglobin: 11.7 g/dL (ref 11.7–15.5)
Lymphs Abs: 2457 cells/uL (ref 850–3900)
MCH: 28.3 pg (ref 27.0–33.0)
MCHC: 32.1 g/dL (ref 32.0–36.0)
MCV: 87.9 fL (ref 80.0–100.0)
MPV: 10.1 fL (ref 7.5–12.5)
Monocytes Relative: 7.9 %
Neutro Abs: 4077 cells/uL (ref 1500–7800)
Neutrophils Relative %: 55.1 %
Platelets: 249 10*3/uL (ref 140–400)
RBC: 4.14 10*6/uL (ref 3.80–5.10)
RDW: 13.5 % (ref 11.0–15.0)
Total Lymphocyte: 33.2 %
WBC: 7.4 10*3/uL (ref 3.8–10.8)

## 2020-02-15 LAB — LIPID PANEL
Cholesterol: 139 mg/dL (ref <200)
HDL: 51 mg/dL (ref >=50)
LDL Cholesterol (Calc): 61 mg/dL (calc)
Non-HDL Cholesterol (Calc): 88 mg/dL (calc) (ref <130)
Total CHOL/HDL Ratio: 2.7 (calc) (ref <5.0)
Triglycerides: 196 mg/dL — ABNORMAL HIGH (ref <150)

## 2020-02-15 LAB — COMPLETE METABOLIC PANEL WITH GFR
AG Ratio: 1.5 (calc) (ref 1.0–2.5)
ALT: 15 U/L (ref 6–29)
AST: 15 U/L (ref 10–35)
Albumin: 4 g/dL (ref 3.6–5.1)
Alkaline phosphatase (APISO): 70 U/L (ref 37–153)
BUN/Creatinine Ratio: 20 (calc) (ref 6–22)
BUN: 24 mg/dL (ref 7–25)
CO2: 26 mmol/L (ref 20–32)
Calcium: 8.8 mg/dL (ref 8.6–10.4)
Chloride: 111 mmol/L — ABNORMAL HIGH (ref 98–110)
Creat: 1.22 mg/dL — ABNORMAL HIGH (ref 0.60–0.93)
GFR, Est African American: 51 mL/min/{1.73_m2} — ABNORMAL LOW (ref >=60)
GFR, Est Non African American: 44 mL/min/{1.73_m2} — ABNORMAL LOW (ref >=60)
Globulin: 2.6 g/dL (calc) (ref 1.9–3.7)
Glucose, Bld: 103 mg/dL — ABNORMAL HIGH (ref 65–99)
Potassium: 5 mmol/L (ref 3.5–5.3)
Sodium: 144 mmol/L (ref 135–146)
Total Bilirubin: 0.3 mg/dL (ref 0.2–1.2)
Total Protein: 6.6 g/dL (ref 6.1–8.1)

## 2020-02-15 LAB — HEMOGLOBIN A1C
Hgb A1c MFr Bld: 5.5 % of total Hgb (ref <5.7)
Mean Plasma Glucose: 111 (calc)
eAG (mmol/L): 6.2 (calc)

## 2020-02-15 LAB — TSH: TSH: 1.39 mIU/L (ref 0.40–4.50)

## 2020-02-18 ENCOUNTER — Other Ambulatory Visit: Payer: Self-pay

## 2020-02-18 ENCOUNTER — Encounter: Payer: Self-pay | Admitting: Internal Medicine

## 2020-02-18 ENCOUNTER — Ambulatory Visit (INDEPENDENT_AMBULATORY_CARE_PROVIDER_SITE_OTHER): Payer: PPO | Admitting: Internal Medicine

## 2020-02-18 VITALS — BP 120/80 | HR 88 | Temp 98.3°F | Ht 59.0 in | Wt 132.0 lb

## 2020-02-18 DIAGNOSIS — Z79811 Long term (current) use of aromatase inhibitors: Secondary | ICD-10-CM

## 2020-02-18 DIAGNOSIS — N3000 Acute cystitis without hematuria: Secondary | ICD-10-CM | POA: Diagnosis not present

## 2020-02-18 DIAGNOSIS — Z8616 Personal history of COVID-19: Secondary | ICD-10-CM

## 2020-02-18 DIAGNOSIS — F411 Generalized anxiety disorder: Secondary | ICD-10-CM

## 2020-02-18 DIAGNOSIS — E039 Hypothyroidism, unspecified: Secondary | ICD-10-CM | POA: Diagnosis not present

## 2020-02-18 DIAGNOSIS — Z1211 Encounter for screening for malignant neoplasm of colon: Secondary | ICD-10-CM

## 2020-02-18 DIAGNOSIS — C50911 Malignant neoplasm of unspecified site of right female breast: Secondary | ICD-10-CM

## 2020-02-18 DIAGNOSIS — Z96651 Presence of right artificial knee joint: Secondary | ICD-10-CM | POA: Diagnosis not present

## 2020-02-18 DIAGNOSIS — N1831 Chronic kidney disease, stage 3a: Secondary | ICD-10-CM

## 2020-02-18 DIAGNOSIS — R0989 Other specified symptoms and signs involving the circulatory and respiratory systems: Secondary | ICD-10-CM | POA: Diagnosis not present

## 2020-02-18 DIAGNOSIS — E782 Mixed hyperlipidemia: Secondary | ICD-10-CM | POA: Diagnosis not present

## 2020-02-18 DIAGNOSIS — F439 Reaction to severe stress, unspecified: Secondary | ICD-10-CM | POA: Diagnosis not present

## 2020-02-18 DIAGNOSIS — Z0000 Encounter for general adult medical examination without abnormal findings: Secondary | ICD-10-CM

## 2020-02-18 DIAGNOSIS — R8290 Unspecified abnormal findings in urine: Secondary | ICD-10-CM

## 2020-02-18 DIAGNOSIS — I10 Essential (primary) hypertension: Secondary | ICD-10-CM | POA: Diagnosis not present

## 2020-02-18 DIAGNOSIS — R7302 Impaired glucose tolerance (oral): Secondary | ICD-10-CM

## 2020-02-18 LAB — POCT URINALYSIS DIPSTICK
Appearance: NEGATIVE
Bilirubin, UA: NEGATIVE
Blood, UA: NEGATIVE
Glucose, UA: NEGATIVE
Ketones, UA: NEGATIVE
Nitrite, UA: NEGATIVE
Odor: NEGATIVE
Protein, UA: NEGATIVE
Spec Grav, UA: 1.01 (ref 1.010–1.025)
Urobilinogen, UA: 0.2 E.U./dL
pH, UA: 6.5 (ref 5.0–8.0)

## 2020-02-18 NOTE — Progress Notes (Signed)
Subjective:    Patient ID: Tara Rodgers, female    DOB: 09-26-1946, 74 y.o.   MRN: [id number]  HPI  74 year old Female for health maintenance exam and evaluation of medical issues.  Sees Dr. Layne Benton for Prolia therapy for osteoporosis.  History of right knee arthroplasty by Dr. Theda Sers May 2019 with residual stiffness.  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  Past medical history: She developed right carpal tunnel syndrome 1995 but improved.  She had surgery for left carpal tunnel syndrome 2010.  Bilateral tubal ligation 1986.  Left tennis elbow release 1990.  Foot and ankle surgery 1999.  Right breast lumpectomy with axillary node dissection 1996.  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  She underwent 6 months of chemotherapy with CMF and adjuvant radiation.  She took estrogen replacement in early 1990s.  She had a suprapubic sling March 2008.  She has a history of perennial allergic rhinitis and vasomotor rhinitis.  Used to be followed by Dr. Donneta Romberg for allergies.  In 2001 skin testing was positive for black walnut, dog, cat, house dust, dust mites, cockroach and mold as well as weeds.  Remote history of PVCs treated with verapamil which she also takes for hypertension.  Was having issues with resting tachycardia for several weeks/months and saw Dr. Alvester Chou in February 2021.  Pulse was 96 on the day she saw him.  He found a left carotid bruit and she had carotid Dopplers that showed no evidence of significant carotid stenosis.  He felt patient was doing well on current regimen of beta-blocker and calcium channel blocker.  He felt hyperlipidemia was under good control on statin medication.  Has declined colonoscopy.  Will order Cologuard.  Had sigmoidoscopy done in 1998 by Dr. Teena Irani.  Social history: Does not smoke at all and consumes alcohol  occasionally.  Present husband is a Dealer and has heart issues and history of alcohol abuse.  He has been very ill and in the hospital recently.  She is retired from [employer].  She has 2 children, a son and a daughter.  She has been married twice.  Family history: Father died in 7591 of complications of COPD at age 44 with history of coronary artery disease.  One half sister with thyroid issues.  She has history of right mastectomy with reconstruction surgery for invasive ductal carcinoma of the right breast June 2016.  Tumor was ER positive PR positive HER-2/neu negative.  She continues to be followed by oncology and is doing well.  Tumor was classified as stage IIa and she is on Femara.   Had bilateral cataract extractions May 2020 Review of Systems Situational stress with husband being very ill in the hospital recently    Objective:   Physical Exam  BP 120/80  , Pulse 88, Weight 132 pounds, T 98.3 Ht 4 ft 11 inches BMI 26.66 weight 132 pounds  Skin warm and dry.  Nodes none.  TMs clear.  Neck is supple without JVD thyromegaly or carotid bruits.  Chest clear to auscultation.  Cardiac exam regular rate and rhythm normal S1 and S2.  Breast without masses.  Abdomen soft nondistended without hepatosplenomegaly masses or tenderness.  Bimanual is normal.  No lower extremity edema.  Neuro intact without focal deficits.      Assessment & Plan:  Left carotid bruit-  recent doppler showed 1-39% ICA stenosis  Sinus Tachycardia- improved and stable on beta blocker and calcium channel blocker  History of breast cancer right breast June 2016 and right breast with axillary node dissection 1996  Essential hypertension longstanding  Anxiety stable  Allergic rhinitis-previously seen by Dr. Donneta Romberg  Hyperlipidemia treated with statin  GE reflux treated with PPI  Hypothyroidism treated with thyroid replacement  History  of multinodular goiter  History of stress urinary incontinence  Osteoarthritis of her hands  Osteoporosis treated with Prolia by Dr. Layne Benton  History of right carpal tunnel syndrome 1995  Status post surgery for left carpal tunnel syndrome 2010  History of COVID-19 in January 2021 and treated at infusion center with monoclonal antibody cocktail  Serratia marcescens UTI treated with Cipro for 7 days with follow-up in June.  Plan: Continue current medications.  She has a left carotid bruit that showed no significant stenosis when checked by Dr. Gwenlyn Found recently.  Hemoglobin A1c is  normal as is TSH.  Triglycerides are elevated at 196.  She has a Serratia UTI and will need follow-up in early June after treatment with Cipro.  Her creatinine is elevated at 1.22 and she is likely developing some chronic kidney disease from longstanding hypertension.  We will follow-up in 6 months.  We have not ordered renal ultrasound but will certainly consider it if creatinine continues to be elevated.  Subjective:   Patient presents for Medicare Annual/Subsequent preventive examination.  Review Past Medical/Family/Social: See above   Risk Factors  Current exercise habits: Not a lot of physical exercise Dietary issues discussed: Low-fat low carbohydrate  Cardiac risk factors: Hyperlipidemia  Depression Screen  (Note: if answer to either of the following is "Yes", a more complete depression screening is indicated)   Over the past two weeks, have you felt down, depressed or hopeless? No  Over the past two weeks, have you felt little interest or pleasure in doing things? No Have you lost interest or pleasure in daily life? No Do you often feel hopeless? No Do you cry easily over simple problems? No   Activities of Daily Living  In your present state of health, do you have any difficulty performing the following activities?:   Driving? No  Managing money? No  Feeding yourself? No  Getting from  bed to chair? No  Climbing a flight of stairs? No  Preparing food and eating?: No  Bathing or showering? No  Getting dressed: No  Getting to the toilet? No  Using the toilet:No  Moving around from place to place: No  In the past year have you fallen or had a near fall?:No  Are you sexually active? No  Do you have more than one partner? No   Hearing Difficulties: No  Do you often ask people to speak up or repeat themselves? No  Do you experience ringing or noises in your ears?  Yes Do you have difficulty understanding soft or whispered voices? No  Do you feel that you have a problem with memory? No Do you often misplace items?  Yes   Home Safety:  Do you have a smoke alarm at your residence? Yes Do you have grab bars in the bathroom?  None Do you have throw rugs in your house? None  Cognitive Testing  Alert? Yes Normal Appearance?Yes  Oriented to person? Yes Place? Yes  Time? Yes  Recall of three objects? Yes  Can perform simple calculations? Yes  Displays appropriate judgment?Yes  Can read the correct  time from a watch face?Yes   List the Names of Other Physician/Practitioners you currently use:  See referral list for the physicians patient is currently seeing.     Review of Systems: See above   Objective:     General appearance: Appears stated age  Head: Normocephalic, without obvious abnormality, atraumatic  Eyes: conj clear, EOMi PEERLA  Ears: normal TM's and external ear canals both ears  Nose: Nares normal. Septum midline. Mucosa normal. No drainage or sinus tenderness.  Throat: lips, mucosa, and tongue normal; teeth and gums normal  Neck: no adenopathy, no carotid bruit, no JVD, supple, symmetrical, trachea midline and thyroid not enlarged, symmetric, no tenderness/mass/nodules  No CVA tenderness.  Lungs: clear to auscultation bilaterally  Breasts: normal appearance, no masses or tenderness Heart: regular rate and rhythm, S1, S2 normal, no murmur, click,  rub or gallop  Abdomen: soft, non-tender; bowel sounds normal; no masses, no organomegaly  Musculoskeletal: ROM normal in all joints, no crepitus, no deformity, Normal muscle strengthen. Back  is symmetric, no curvature. Skin: Skin color, texture, turgor normal. No rashes or lesions  Lymph nodes: Cervical, supraclavicular, and axillary nodes normal.  Neurologic: CN 2 -12 Normal, Normal symmetric reflexes. Normal coordination and gait  Psych: Alert & Oriented x 3, Mood appear stable.    Assessment:    Annual wellness medicare exam   Plan:    During the course of the visit the patient was educated and counseled about appropriate screening and preventive services including:   Annual mammogram  Annual flu vaccine  Recently had 2 COVID-19 immunizations in addition to having COVID-19 infection in January  Patient Instructions (the written plan) was given to the patient.  Medicare Attestation  I have personally reviewed:  The patient's medical and social history  Their use of alcohol, tobacco or illicit drugs  Their current medications and supplements  The patient's functional ability including ADLs,fall risks, home safety risks, cognitive, and hearing and visual impairment  Diet and physical activities  Evidence for depression or mood disorders  The patient's weight, height, BMI, and visual acuity have been recorded in the chart. I have made referrals, counseling, and provided education to the patient based on review of the above and I have provided the patient with a written personalized care plan for preventive services.

## 2020-02-20 LAB — URINE CULTURE
MICRO NUMBER:: 10452580
SPECIMEN QUALITY:: ADEQUATE

## 2020-02-21 ENCOUNTER — Other Ambulatory Visit: Payer: Self-pay

## 2020-02-21 MED ORDER — CIPROFLOXACIN HCL 250 MG PO TABS: 250.0000 mg | ORAL_TABLET | Freq: Two times a day (BID) | ORAL | 0 refills | Status: DC

## 2020-02-21 NOTE — Progress Notes (Unsigned)
c 

## 2020-02-28 ENCOUNTER — Ambulatory Visit: Payer: PPO | Attending: Internal Medicine

## 2020-02-28 DIAGNOSIS — Z23 Encounter for immunization: Secondary | ICD-10-CM

## 2020-02-28 DIAGNOSIS — Z1211 Encounter for screening for malignant neoplasm of colon: Secondary | ICD-10-CM | POA: Diagnosis not present

## 2020-02-28 NOTE — Progress Notes (Signed)
   Covid-[facility] Vaccination [facility]  Name:  RAAHI KORBER    MRN: [id number] DOB: 1946/08/05  02/28/2020  Ms. Rath was observed post Covid-19 immunization for 15 minutes without incident. She was provided with Vaccine Information Sheet and instruction to access the V-Safe system.   Ms. Isais was instructed to call 911 with any severe reactions post vaccine: Marland Kitchen Difficulty breathing  . Swelling of face and throat  . A fast heartbeat  . A bad rash all over body  . Dizziness and weakness   Immunizations Administered    Name Date Dose VIS Date Route   Pfizer COVID-19 Vaccine 02/28/2020  1:47 PM 0.3 mL 12/08/2018 Intramuscular   Manufacturer: [place]   Lot: [id number]   [place]: [id number]

## 2020-02-29 ENCOUNTER — Other Ambulatory Visit: Payer: Self-pay | Admitting: Internal Medicine

## 2020-02-29 MED ORDER — VERAPAMIL HCL ER 240 MG PO TBCR: EXTENDED_RELEASE_TABLET | ORAL | 0 refills | Status: DC

## 2020-02-29 NOTE — Telephone Encounter (Signed)
Please change Rx to one whole tab and I will sign 90 days with one refill

## 2020-02-29 NOTE — Telephone Encounter (Signed)
Pt calling in refill request to be sent to [facility].  verapamil (CALAN-SR) 240 MG CR tablet 90 day supply, she said she takes a whole tablet

## 2020-03-03 LAB — COLOGUARD: Cologuard: NEGATIVE

## 2020-03-13 NOTE — Patient Instructions (Addendum)
You have a urinary tract infection and a follow-up in early June.  Cologuard ordered.  Continue current medications as previously prescribed.  Will need follow-up of elevated serum creatinine.

## 2020-03-21 ENCOUNTER — Ambulatory Visit (INDEPENDENT_AMBULATORY_CARE_PROVIDER_SITE_OTHER): Payer: PPO | Admitting: Internal Medicine

## 2020-03-21 ENCOUNTER — Encounter: Payer: Self-pay | Admitting: Internal Medicine

## 2020-03-21 ENCOUNTER — Other Ambulatory Visit: Payer: Self-pay

## 2020-03-21 VITALS — BP 110/80 | Temp 98.0°F | Ht 59.0 in

## 2020-03-21 DIAGNOSIS — A488 Other specified bacterial diseases: Secondary | ICD-10-CM | POA: Diagnosis not present

## 2020-03-21 NOTE — Progress Notes (Signed)
   Subjective:    Patient ID: Tara Rodgers, female    DOB: 04-15-1946, 74 y.o.   MRN: [id number]  HPI Seen May 7th for CPE and had abnormal urine dipstick. Culture grew Serratia marcescens treated with Cipro. Here today for follow up. First specimen that she submitted today was a small amount of urine and likely not clean catch. Was asymptomatic when seen May 7th  despite having abnormal urinalysis and subsequent positive urine culture.    Review of Systems see above- still asymptomatic     Objective:   Physical Exam  VS reviewed. She could not give sufficient urine to get a good clean catch despite 2 tries and drinking fluids.       Assessment & Plan:  Follow up UTI- could not provide adequate specimen after nearly one hour in office and drinking fluids.  Plan: Return tomorrow.  Needs to have several glasses of liquid before coming to the office to submit urine specimen.  Reviewed with her today how to give clean-catch specimen.

## 2020-03-21 NOTE — Patient Instructions (Addendum)
Return tomorrow for another urine specimen attempt.  Drink several glasses of fluid prior to coming to the office.

## 2020-03-22 ENCOUNTER — Encounter: Payer: Self-pay | Admitting: Internal Medicine

## 2020-03-22 ENCOUNTER — Ambulatory Visit (INDEPENDENT_AMBULATORY_CARE_PROVIDER_SITE_OTHER): Payer: PPO | Admitting: Internal Medicine

## 2020-03-22 ENCOUNTER — Other Ambulatory Visit: Payer: Self-pay

## 2020-03-22 VITALS — Ht 59.0 in

## 2020-03-22 DIAGNOSIS — Z1211 Encounter for screening for malignant neoplasm of colon: Secondary | ICD-10-CM

## 2020-03-22 DIAGNOSIS — R8290 Unspecified abnormal findings in urine: Secondary | ICD-10-CM

## 2020-03-22 DIAGNOSIS — R319 Hematuria, unspecified: Secondary | ICD-10-CM

## 2020-03-22 DIAGNOSIS — A488 Other specified bacterial diseases: Secondary | ICD-10-CM

## 2020-03-22 LAB — POCT URINALYSIS DIPSTICK
Bilirubin, UA: NEGATIVE
Glucose, UA: NEGATIVE
Ketones, UA: NEGATIVE
Nitrite, UA: NEGATIVE
Protein, UA: NEGATIVE
Spec Grav, UA: 1.01 (ref 1.010–1.025)
Urobilinogen, UA: 0.2 E.U./dL
pH, UA: 6.5 (ref 5.0–8.0)

## 2020-03-22 NOTE — Patient Instructions (Signed)
We will contact [facility] about results. Patient submitted sample. Re-culture urine as it is still abnormal.

## 2020-03-22 NOTE — Progress Notes (Signed)
   Subjective:    Patient ID: Tara Rodgers, female    DOB: 1946-07-08, 74 y.o.   MRN: [id number]  HPI 74 year old Female returns today to follow up on Serratia marscescens UTI. Gave adequate urine today and urine specimen is still abnormal although she has no complaints other than frequency. Urine will be recultured and sent for microscopic. Patient asking about [facility] results and we will call them.    Review of Systems see above     Objective:   Physical Exam  No CVA tenderness. See U/A results      Assessment & Plan:  Abnormal urinalysis Plan: re-culture urine and await results  Health maintenance- call [facility] about results

## 2020-03-23 ENCOUNTER — Encounter: Payer: Self-pay | Admitting: Internal Medicine

## 2020-03-24 LAB — URINE CULTURE
MICRO NUMBER:: 10570994
SPECIMEN QUALITY:: ADEQUATE

## 2020-03-24 LAB — URINALYSIS, MICROSCOPIC ONLY
Bacteria, UA: NONE SEEN /HPF
Hyaline Cast: NONE SEEN /LPF
RBC / HPF: NONE SEEN /HPF (ref 0–2)
Squamous Epithelial / HPF: NONE SEEN /HPF (ref <=5)

## 2020-03-27 ENCOUNTER — Ambulatory Visit
Admission: RE | Admit: 2020-03-27 | Discharge: 2020-03-27 | Disposition: A | Discharge status: Home / Self Care | Payer: PPO | Source: Ambulatory Visit | Attending: Hematology | Admitting: Hematology

## 2020-03-27 ENCOUNTER — Other Ambulatory Visit: Payer: Self-pay

## 2020-03-27 DIAGNOSIS — Z1231 Encounter for screening mammogram for malignant neoplasm of breast: Secondary | ICD-10-CM

## 2020-03-27 DIAGNOSIS — M8589 Other specified disorders of bone density and structure, multiple sites: Secondary | ICD-10-CM | POA: Diagnosis not present

## 2020-03-27 MED ORDER — DOXYCYCLINE HYCLATE 100 MG PO TABS: 100.0000 mg | ORAL_TABLET | Freq: Two times a day (BID) | ORAL | 0 refills | Status: DC

## 2020-03-28 NOTE — Progress Notes (Addendum)
[place]   [phone] Fax:(336) [phone]   Clinic Follow up Note   Patient Care Team: [name], [name] [name], MD as PCP - General (Internal Medicine) [name], MD as Consulting Physician (Hematology) [name], MD (Inactive) as Consulting Physician (General Surgery) [name], NP as Nurse Practitioner (Nurse Practitioner) [name], MD as Consulting Physician (Plastic Surgery) [name], MD as Consulting Physician (Orthopedic Surgery)  Date of Service:  03/30/2020  CHIEF COMPLAINT: Follow up right breast cancer  SUMMARY OF ONCOLOGIC HISTORY: Oncology History Overview Note    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) and adjuvant radiation ([name]).   12/13/2014 Mammogram   Right breast: possible asymmetry warranting further evaluation with spot compression views and possibly ultrasound   12/16/2014 Breast US   Right breast: no definitive abnormality within the superior aspect of the right breast to correspond with the mammographic finding.   01/10/2015 Breast MRI   Right breast: irregular rim enhancing mass within the superior right breast at 11:30 measuring 2.5 x 2.4 x 2.4 cm. No abnormal enhancement is identified within the skin of the superior right breast in the area of concern on mammogram.   01/12/2015 Breast US   Second look diagnostic mammogram and ultrasound showed a 2.3 cm mass in the upper midline right breast. No axillary adenopathy.   01/12/2015 Initial Biopsy   Right breast needle core bx  (upper midline): Invasive ductal carcinoma, grade 2, ER+ (100%), PR+ (77%), HER2/neu negative (ratio 1.15), Ki67 20%    01/30/2015 Procedure   Breast High/Moderate Risk panel ([facility]) reveals no clinically significant variant at ATM, BRCA1, BRCA2, CDH1, CHEK2, PALB2, PTEN, STK11, and TP53.   02/08/2015 Clinical Stage   Stage IIA (T2 N0)   03/23/2015 Definitive Surgery   Right mastectomy ([name]): invasive adenocarcinoma, grade 3, 2.9 cm, HER2/neu repeated, negative (ratio 1.23)   03/23/2015 Oncotype testing   Score: 8 (6% ROR). No chemotherapy [name]).   03/23/2015 Pathologic Stage   Stage IIA: pT2 pNx   05/04/2015 -  Anti-estrogen oral therapy   Letrozole 2.26m daily. Planned duration of therapy at least 5 years.    07/25/2015 Survivorship   Survivorship visit completed and copy of care plan provided to patient.   12/15/2015 Mammogram   IMPRESSION: No mammographic evidence of malignancy. A result letter of this screening mammogram will be mailed directly to the patient.   12/23/2016 Mammogram   IMPRESSION: No mammographic evidence of malignancy. A result letter of this screening mammogram will be mailed directly to the patient.    12/30/2017 Mammogram   12/30/2017 Mammogram IMPRESSION: No mammographic evidence of malignancy. A result letter of this screening mammogram will be mailed directly to the patient.      CURRENT THERAPY:  Letrozole 2.5 mg once daily, started on 05/04/2015  INTERVAL HISTORY:  Tara BOLINis here for a follow up of right breast cancer. She was last seen by me 9 months ago. She presents to the clinic alone. She notes she is doing well. She denies any new changes. She notes  she is tolerating Letrozole well with no side effects. She notes she was started on Metoprolol to better control her HTN and tachycardia. Her BP is normal now. She is now under the care of cardiologist Dr Gwenlyn Found and he increased her Metoprolol to 36m. She only notes occasional  palpitations. She notes she is not completely satisfied with her breast reconstruction as the size of her implant is small to her.    REVIEW OF SYSTEMS:   Constitutional: Denies fevers, chills or abnormal weight loss Eyes: Denies blurriness of vision Ears, nose, mouth, throat, and face: Denies mucositis or sore throat Respiratory: Denies cough, dyspnea or wheezes Cardiovascular: chest discomfort or lower extremity swelling (+) Occasional Tachycardia  Gastrointestinal:  Denies nausea, heartburn or change in bowel habits Skin: Denies abnormal skin rashes Lymphatics: Denies new lymphadenopathy or easy bruising Neurological:Denies numbness, tingling or new weaknesses Behavioral/Psych: Mood is stable, no new changes  All other systems were reviewed with the patient and are negative.  MEDICAL HISTORY:  Past Medical History:  Diagnosis Date  . Anxiety   . Asthma    does not take any medications, hx of inhaler use  . Breast cancer ([facility] 1995/2016   right sided breast cancer in 08/1994  . Bronchitis    hx of  . GERD (gastroesophageal reflux disease)   . History of hiatal hernia   . Hypertension   . Hyperthyroidism     SURGICAL HISTORY: Past Surgical History:  Procedure Laterality Date  . BREAST BIOPSY Right 2016   malignant  . BREAST EXCISIONAL BIOPSY Left 1996   benign  . Carpel Tunnel  Bilateral   . INCONTINENCE SURGERY     with mesh insertion about 7 years ago  . LATISSIMUS FLAP TO BREAST Right 03/23/2015   Procedure: LATISSIMUS FLAP TO BREAST WITH PLACEMENT OF IMPLANT FOR BREAST RECONSTRUCTION;  Surgeon: DCrissie Reese MD;  [facility]  Service: Plastics;  Laterality: Right;  . MASTECTOMY Right 03/23/2015  . RECONSTRUCTION BREAST W/ LATISSIMUS DORSI FLAP Right 03/23/2015  . TONSILLECTOMY     as a child  . TOTAL KNEE ARTHROPLASTY Right 03/13/2018   Procedure: RIGHT TOTAL KNEE ARTHROPLASTY;  Surgeon: CSydnee Cabal MD;  Location: [facility];  Service: Orthopedics;   Laterality: Right;  Adductor Block  . TOTAL MASTECTOMY Right 03/23/2015   Procedure: RIGHT TOTAL MASTECTOMY;  Surgeon: BExcell Seltzer MD;  [facility]  Service: General;  Laterality: Right;    I have reviewed the social history and family history with the patient and they are unchanged from previous note.  ALLERGIES:  is allergic to fosamax [alendronate sodium].  MEDICATIONS:  Current Outpatient Medications  Medication Sig Dispense Refill  . ALPRAZolam (XANAX) 0.5 MG tablet Take 1 tablet (0.5 mg total) by mouth 2 (two) times daily as needed. 180 tablet 1  . ciprofloxacin (CIPRO) 250 MG tablet Take 1 tablet (250 mg total) by mouth 2 (two) times daily. 14 tablet 0  . cyclobenzaprine (FLEXERIL) 10 MG tablet Take 1 tablet (10 mg total) by mouth 3 (three) times daily as needed for muscle spasms. 30 tablet 2  . doxycycline (VIBRA-TABS) 100 MG tablet Take 1 tablet (100 mg total) by mouth 2 (two) times daily. 20 tablet 0  . letrozole (FEMARA) 2.5 MG tablet Take 1 tablet (2.5 mg total) by mouth daily. 90 tablet 3  . levothyroxine (SYNTHROID) 75 MCG tablet Take 1 tablet (75 mcg total) by mouth daily. 90 tablet 0  . metoprolol succinate (TOPROL-XL) 50 MG 24 hr  tablet Take 1 tablet (50 mg total) by mouth 2 (two) times daily. 180 tablet 3  . pantoprazole (PROTONIX) 40 MG tablet Take 1 tablet (40 mg total) by mouth daily. 90 tablet 3  . simvastatin (ZOCOR) 40 MG tablet Take 1 tablet (40 mg total) by mouth at bedtime. 90 tablet 1  . verapamil (CALAN-SR) 240 MG CR tablet Take 1 tablet daily 90 tablet 0   No current facility-administered medications for this visit.    PHYSICAL EXAMINATION: ECOG PERFORMANCE STATUS: 0  Vitals:   03/30/20 1416  BP: 128/64  Pulse: 95  Resp: 18  Temp: 97.7 F (36.5 C)  SpO2: 96%   Filed Weights   03/30/20 1416  Weight: 134 lb 12.8 oz (61.1 kg)    GENERAL:alert, no distress and comfortable SKIN: skin color, texture, turgor are normal, no rashes or  significant lesions EYES: normal, Conjunctiva are pink and non-injected, sclera clear  NECK: supple, thyroid normal size, non-tender, without nodularity LYMPH:  no palpable lymphadenopathy in the cervical, axillary  LUNGS: clear to auscultation and percussion with normal breathing effort HEART: regular rate & rhythm and no murmurs and no lower extremity edema ABDOMEN:abdomen soft, non-tender and normal bowel sounds Musculoskeletal:no cyanosis of digits and no clubbing  NEURO: alert & oriented x 3 with fluent speech, no focal motor/sensory deficits BREAST: S/p right mastectomy with implant: Surgical incision healed well. No palpable mass, nodules or adenopathy bilaterally. Breast exam benign.   LABORATORY DATA:  I have reviewed the data as listed CBC Latest Ref Rng & Units 02/14/2020 10/11/2019 07/01/2019  WBC 3.8 - 10.8 Thousand/uL 7.4 7.8 8.9  Hemoglobin 11.7 - 15.5 g/dL 11.7 11.9 12.8  Hematocrit 35 - 45 % 36.4 35.4 38.8  Platelets 140 - 400 Thousand/uL 249 295 321     CMP Latest Ref Rng & Units 03/30/2020 02/14/2020 10/11/2019  Glucose 70 - 99 mg/dL 107(H) 103(H) 106  BUN 8 - 23 mg/dL '23 24 15  ' Creatinine 0.44 - 1.00 mg/dL 1.26(H) 1.22(H) 1.15(H)  Sodium 135 - 145 mmol/L 139 144 141  Potassium 3.5 - 5.1 mmol/L 4.2 5.0 4.2  Chloride 98 - 111 mmol/L 108 111(H) 106  CO2 22 - 32 mmol/L '22 26 24  ' Calcium 8.9 - 10.3 mg/dL 9.3 8.8 9.2  Total Protein 6.5 - 8.1 g/dL 7.1 6.6 6.5  Total Bilirubin 0.3 - 1.2 mg/dL 0.5 0.3 0.3  Alkaline Phos 38 - 126 U/L 93 - -  AST 15 - 41 U/L 14(L) 15 15  ALT 0 - 44 U/L '15 15 13      ' RADIOGRAPHIC STUDIES: I have personally reviewed the radiological images as listed and agreed with the findings in the report. No results found.   ASSESSMENT & PLAN:  Tara Rodgers is a 74 y.o. female with    1. Recurrent right breast invasive ductal carcinoma, pT2N0, stage II, grade 3, ER positive, PR positive, HER-2 negative -She was initiallydiagnosed in 1996 with  right breast cancer. She was treated with right breast lumpectomy, adjuvant 6 months chemo with CMF and adjuvant radiation.  -She was diagnosed with cancer recurrence in 12/2014. She iss/prightmastectomy with completed resection. Oncotype showed low risk diseaseso I did not recommend adjuvantchemotherapy. -She started anti-estrogen therapy withletrozolein 04/2015, we'll continue for total of 7-10 years due to her recurrent disease. Tolerating well. -She is clinically doing well. Lab reviewed. Her physical exam and her 03/2020 mammogram were unremarkable. There is no clinical concern for recurrence. -Continue Surveillance. Next Mammogram in 03/2021.  -  Continue Letrozole  -f/u in 1 year   2. Osteopenia -Shepreviously had Osteoporosis. She wasgetting Prolia at her PCP's office Every 6 months,she stopped due to high co-pay after about 2 years. She notes she did not tolerate oral Alendronate so she stopped after 5 years -Her DEXA from 07/13/18 sheimproved to osteopenia withlowest T-Score of -2.2 at [facility] with fracture risk of 20% to spine and 6.4% risk of hip fracture. -She previously declined bisphosphonate with Zometa injections. She has had at least 7 years of bisphosphonate and Prolia in the past, which is adequate.  -continue calcium 1 g, vitamin D at least 1000 units daily, and weight-bearing exercise distress or bone. -Next DEXA late 2021. Will continue Letrozole if her bone density does not get much worse   3. Arthritis Kennedy Bucker a right knee replacement in May 2019 -Uses Flexeril as needed   4. HTN, Tachycardia  -She recent uncontrolled HTN and Tachycardia in the past 9 months.  -She was started on Metoprolol and continues Zocor. She is followed by her PCP and her cardiologist.  -Her HTN is well controlled now and no tachycardia today (03/30/20). She occasionally has palpitations.   PLAN -Continue Letrozole  -Left mammogram in 03/2021 -lab andfollow upin 1 year    No  problem-specific Assessment & Plan notes found for this encounter.   Orders Placed This Encounter  Procedures  . MM Digital Screening Unilat L    Standing Status:   Future    Standing Expiration Date:   03/30/2021    Order Specific Question:   Reason for Exam (SYMPTOM  OR DIAGNOSIS REQUIRED)    Answer:   screening    Order Specific Question:   Preferred imaging location?    Answer:   [facility]    Order Specific Question:   Release to patient    Answer:   Immediate   All questions were answered. The patient knows to call the clinic with any problems, questions or concerns. No barriers to learning was detected. The total time spent in the appointment was 25 minutes.     [name], MD 03/30/2020   I, Joslyn Devon, am acting as scribe for [name], MD.   I have reviewed the above documentation for accuracy and completeness, and I agree with the above.

## 2020-03-30 ENCOUNTER — Telehealth: Payer: Self-pay | Admitting: Hematology

## 2020-03-30 ENCOUNTER — Other Ambulatory Visit: Payer: Self-pay

## 2020-03-30 ENCOUNTER — Inpatient Hospital Stay: Payer: PPO | Admitting: Hematology

## 2020-03-30 ENCOUNTER — Encounter: Payer: Self-pay | Admitting: Hematology

## 2020-03-30 ENCOUNTER — Inpatient Hospital Stay: Payer: PPO | Attending: Hematology

## 2020-03-30 VITALS — BP 128/64 | HR 95 | Temp 97.7°F | Resp 18 | Ht 59.0 in | Wt 134.8 lb

## 2020-03-30 DIAGNOSIS — Z79811 Long term (current) use of aromatase inhibitors: Secondary | ICD-10-CM | POA: Insufficient documentation

## 2020-03-30 DIAGNOSIS — I10 Essential (primary) hypertension: Secondary | ICD-10-CM | POA: Diagnosis not present

## 2020-03-30 DIAGNOSIS — C50911 Malignant neoplasm of unspecified site of right female breast: Secondary | ICD-10-CM

## 2020-03-30 DIAGNOSIS — R002 Palpitations: Secondary | ICD-10-CM | POA: Insufficient documentation

## 2020-03-30 DIAGNOSIS — R000 Tachycardia, unspecified: Secondary | ICD-10-CM | POA: Insufficient documentation

## 2020-03-30 DIAGNOSIS — C50411 Malignant neoplasm of upper-outer quadrant of right female breast: Secondary | ICD-10-CM | POA: Insufficient documentation

## 2020-03-30 DIAGNOSIS — Z1231 Encounter for screening mammogram for malignant neoplasm of breast: Secondary | ICD-10-CM | POA: Diagnosis not present

## 2020-03-30 DIAGNOSIS — M810 Age-related osteoporosis without current pathological fracture: Secondary | ICD-10-CM

## 2020-03-30 DIAGNOSIS — Z923 Personal history of irradiation: Secondary | ICD-10-CM | POA: Diagnosis not present

## 2020-03-30 DIAGNOSIS — M1990 Unspecified osteoarthritis, unspecified site: Secondary | ICD-10-CM | POA: Insufficient documentation

## 2020-03-30 DIAGNOSIS — Z170 Estrogen receptor positive status [ER+]: Secondary | ICD-10-CM | POA: Diagnosis not present

## 2020-03-30 DIAGNOSIS — M8580 Other specified disorders of bone density and structure, unspecified site: Secondary | ICD-10-CM | POA: Insufficient documentation

## 2020-03-30 DIAGNOSIS — Z853 Personal history of malignant neoplasm of breast: Secondary | ICD-10-CM | POA: Insufficient documentation

## 2020-03-30 LAB — COMPREHENSIVE METABOLIC PANEL
ALT: 15 U/L (ref 0–44)
AST: 14 U/L — ABNORMAL LOW (ref 15–41)
Albumin: 3.9 g/dL (ref 3.5–5.0)
Alkaline Phosphatase: 93 U/L (ref 38–126)
Anion gap: 9 (ref 5–15)
BUN: 23 mg/dL (ref 8–23)
CO2: 22 mmol/L (ref 22–32)
Calcium: 9.3 mg/dL (ref 8.9–10.3)
Chloride: 108 mmol/L (ref 98–111)
Creatinine, Ser: 1.26 mg/dL — ABNORMAL HIGH (ref 0.44–1.00)
GFR calc Af Amer: 49 mL/min — ABNORMAL LOW (ref >60)
GFR calc non Af Amer: 43 mL/min — ABNORMAL LOW (ref >60)
Glucose, Bld: 107 mg/dL — ABNORMAL HIGH (ref 70–99)
Potassium: 4.2 mmol/L (ref 3.5–5.1)
Sodium: 139 mmol/L (ref 135–145)
Total Bilirubin: 0.5 mg/dL (ref 0.3–1.2)
Total Protein: 7.1 g/dL (ref 6.5–8.1)

## 2020-03-30 LAB — VITAMIN D 25 HYDROXY (VIT D DEFICIENCY, FRACTURES): Vit D, 25-Hydroxy: 32.54 ng/mL (ref 30–100)

## 2020-03-30 MED ORDER — LETROZOLE 2.5 MG PO TABS: 2.5000 mg | ORAL_TABLET | Freq: Every day | ORAL | 3 refills | Status: AC

## 2020-03-30 NOTE — Telephone Encounter (Signed)
Scheduled per los. Patient declined printout  

## 2020-04-03 ENCOUNTER — Telehealth: Payer: Self-pay | Admitting: Emergency Medicine

## 2020-04-03 NOTE — Telephone Encounter (Addendum)
Pt verbalized understanding   ----- Message from [name], MD sent at 04/02/2020  7:23 AM EDT ----- Please let pt know her lab results,mild CKD is stable, no other concerns, thanks   [name]

## 2020-04-07 ENCOUNTER — Encounter: Payer: Self-pay | Admitting: Internal Medicine

## 2020-04-07 ENCOUNTER — Other Ambulatory Visit: Payer: Self-pay

## 2020-04-07 ENCOUNTER — Ambulatory Visit (INDEPENDENT_AMBULATORY_CARE_PROVIDER_SITE_OTHER): Payer: PPO | Admitting: Internal Medicine

## 2020-04-07 VITALS — BP 130/70 | HR 87 | Temp 98.5°F | Ht 59.0 in | Wt 134.0 lb

## 2020-04-07 DIAGNOSIS — M85851 Other specified disorders of bone density and structure, right thigh: Secondary | ICD-10-CM

## 2020-04-07 DIAGNOSIS — E782 Mixed hyperlipidemia: Secondary | ICD-10-CM | POA: Diagnosis not present

## 2020-04-07 DIAGNOSIS — N390 Urinary tract infection, site not specified: Secondary | ICD-10-CM | POA: Diagnosis not present

## 2020-04-07 DIAGNOSIS — N1831 Chronic kidney disease, stage 3a: Secondary | ICD-10-CM

## 2020-04-07 DIAGNOSIS — A499 Bacterial infection, unspecified: Secondary | ICD-10-CM

## 2020-04-07 LAB — POCT URINALYSIS DIPSTICK
Appearance: NEGATIVE
Bilirubin, UA: NEGATIVE
Blood, UA: NEGATIVE
Glucose, UA: NEGATIVE
Ketones, UA: NEGATIVE
Leukocytes, UA: NEGATIVE
Nitrite, UA: NEGATIVE
Odor: NEGATIVE
Protein, UA: NEGATIVE
Spec Grav, UA: 1.015 (ref 1.010–1.025)
Urobilinogen, UA: 0.2 E.U./dL
pH, UA: 6.5 (ref 5.0–8.0)

## 2020-04-07 NOTE — Patient Instructions (Signed)
Return November 11 for 6 month recheck. Contact Dr. [name] about possibility of Reclast infusion since Prolia was expensive for you.

## 2020-04-07 NOTE — Progress Notes (Signed)
   Subjective:    Patient ID: Tara Rodgers, female    DOB: 11-Nov-1945, 74 y.o.   MRN: [id number]  HPI 74 year old Female in today to follow-up on recent urinary tract infection.  Also recently had bone density study done at [facility] and lowest T score was -2.3 in the right femoral neck.  Previously in 2021 she had a T score of -2.3 in the right femoral neck and in 2019-2.10 in the right femoral neck.  She used to take Prolia but her co-pay was about $300.  This was done through Dr. Layne Benton, orthopedist.  Says she has been off of Prolia for about 3 years now.  I think she should reconsider it.  She says that oncologist suggested IV infusion which sounds like Reclast.  She is to talk with oncologist about this.  Bone density study will be scanned into Epic  Had Serratia marcescens UTI in early June treated with Cipro.  This resolved and repeat urine culture showed staph epidermidis treated with doxycycline.  She is here today for follow-up.  Urine dipstick now is completely normal.  She feels well.  Recent Cologuard study was negative.  Recent labs in May showed significant hypertriglyceridemia of 196 despite being on simvastatin.  Review of Systems see above-no new complaints     Objective:   Physical Exam  Blood pressure 130/70, pulse 87, temperature 98.5 degrees pulse oximetry 98% weight 134 pounds, BMI 27.06 Not examined today.  Spoke with patient for approximately 20 minutes regarding dipstick UA results and recent bone density study.     Assessment & Plan:  Osteopenia right femoral neck  History of breast cancer  UTI-resolved  History of chronic kidney disease stage IIIa  Plan: She will return here in November for follow-up.  She will discuss options for treating osteopenia with her oncologist.

## 2020-06-08 ENCOUNTER — Other Ambulatory Visit: Payer: Self-pay | Admitting: Internal Medicine

## 2020-07-05 DIAGNOSIS — M25531 Pain in right wrist: Secondary | ICD-10-CM | POA: Diagnosis not present

## 2020-07-25 ENCOUNTER — Other Ambulatory Visit: Payer: Self-pay | Admitting: Internal Medicine

## 2020-07-26 DIAGNOSIS — M25531 Pain in right wrist: Secondary | ICD-10-CM | POA: Diagnosis not present

## 2020-08-09 DIAGNOSIS — M25531 Pain in right wrist: Secondary | ICD-10-CM | POA: Diagnosis not present

## 2020-08-21 ENCOUNTER — Other Ambulatory Visit: Payer: Self-pay

## 2020-08-21 ENCOUNTER — Ambulatory Visit: Payer: PPO | Admitting: Internal Medicine

## 2020-08-21 ENCOUNTER — Other Ambulatory Visit: Payer: PPO | Admitting: Internal Medicine

## 2020-08-21 DIAGNOSIS — R7302 Impaired glucose tolerance (oral): Secondary | ICD-10-CM

## 2020-08-21 DIAGNOSIS — N1831 Chronic kidney disease, stage 3a: Secondary | ICD-10-CM | POA: Diagnosis not present

## 2020-08-21 DIAGNOSIS — E782 Mixed hyperlipidemia: Secondary | ICD-10-CM | POA: Diagnosis not present

## 2020-08-21 DIAGNOSIS — M85851 Other specified disorders of bone density and structure, right thigh: Secondary | ICD-10-CM | POA: Diagnosis not present

## 2020-08-22 LAB — HEPATIC FUNCTION PANEL
AG Ratio: 1.8 (calc) (ref 1.0–2.5)
ALT: 17 U/L (ref 6–29)
AST: 18 U/L (ref 10–35)
Albumin: 4.2 g/dL (ref 3.6–5.1)
Alkaline phosphatase (APISO): 88 U/L (ref 37–153)
Bilirubin, Direct: 0.1 mg/dL (ref 0.0–0.2)
Globulin: 2.4 g/dL (calc) (ref 1.9–3.7)
Indirect Bilirubin: 0.3 mg/dL (calc) (ref 0.2–1.2)
Total Bilirubin: 0.4 mg/dL (ref 0.2–1.2)
Total Protein: 6.6 g/dL (ref 6.1–8.1)

## 2020-08-22 LAB — HEMOGLOBIN A1C
Hgb A1c MFr Bld: 5.7 % of total Hgb — ABNORMAL HIGH (ref <5.7)
Mean Plasma Glucose: 117 (calc)
eAG (mmol/L): 6.5 (calc)

## 2020-08-22 LAB — LIPID PANEL
Cholesterol: 139 mg/dL (ref <200)
HDL: 58 mg/dL (ref >=50)
LDL Cholesterol (Calc): 56 mg/dL (calc)
Non-HDL Cholesterol (Calc): 81 mg/dL (calc) (ref <130)
Total CHOL/HDL Ratio: 2.4 (calc) (ref <5.0)
Triglycerides: 178 mg/dL — ABNORMAL HIGH (ref <150)

## 2020-08-22 LAB — TSH: TSH: 0.63 mIU/L (ref 0.40–4.50)

## 2020-08-24 ENCOUNTER — Other Ambulatory Visit: Payer: Self-pay

## 2020-08-24 ENCOUNTER — Ambulatory Visit (INDEPENDENT_AMBULATORY_CARE_PROVIDER_SITE_OTHER): Payer: PPO | Admitting: Internal Medicine

## 2020-08-24 ENCOUNTER — Encounter: Payer: Self-pay | Admitting: Internal Medicine

## 2020-08-24 VITALS — BP 110/70 | HR 89 | Temp 97.9°F | Ht 59.0 in | Wt 138.0 lb

## 2020-08-24 DIAGNOSIS — R8290 Unspecified abnormal findings in urine: Secondary | ICD-10-CM

## 2020-08-24 DIAGNOSIS — I10 Essential (primary) hypertension: Secondary | ICD-10-CM

## 2020-08-24 DIAGNOSIS — R7302 Impaired glucose tolerance (oral): Secondary | ICD-10-CM | POA: Diagnosis not present

## 2020-08-24 DIAGNOSIS — E039 Hypothyroidism, unspecified: Secondary | ICD-10-CM

## 2020-08-24 DIAGNOSIS — R350 Frequency of micturition: Secondary | ICD-10-CM | POA: Diagnosis not present

## 2020-08-24 DIAGNOSIS — N390 Urinary tract infection, site not specified: Secondary | ICD-10-CM

## 2020-08-24 DIAGNOSIS — N1831 Chronic kidney disease, stage 3a: Secondary | ICD-10-CM | POA: Diagnosis not present

## 2020-08-24 DIAGNOSIS — F411 Generalized anxiety disorder: Secondary | ICD-10-CM | POA: Diagnosis not present

## 2020-08-24 DIAGNOSIS — C50911 Malignant neoplasm of unspecified site of right female breast: Secondary | ICD-10-CM

## 2020-08-24 DIAGNOSIS — B964 Proteus (mirabilis) (morganii) as the cause of diseases classified elsewhere: Secondary | ICD-10-CM | POA: Diagnosis not present

## 2020-08-24 DIAGNOSIS — Z79811 Long term (current) use of aromatase inhibitors: Secondary | ICD-10-CM | POA: Diagnosis not present

## 2020-08-24 DIAGNOSIS — E782 Mixed hyperlipidemia: Secondary | ICD-10-CM

## 2020-08-24 LAB — POCT URINALYSIS DIPSTICK
Appearance: ABNORMAL
Glucose, UA: NEGATIVE
Ketones, UA: NEGATIVE
Nitrite, UA: POSITIVE
Odor: ABNORMAL
Protein, UA: POSITIVE — AB
Spec Grav, UA: 1.01 (ref 1.010–1.025)
Urobilinogen, UA: 0.2 E.U./dL
pH, UA: 8.5 — AB (ref 5.0–8.0)

## 2020-08-24 MED ORDER — ESOMEPRAZOLE MAGNESIUM 40 MG PO CPDR: DELAYED_RELEASE_CAPSULE | ORAL | 1 refills | Status: AC

## 2020-08-24 NOTE — Progress Notes (Signed)
   Subjective:    Patient ID: Tara Rodgers, female    DOB: Jul 26, 1946, 74 y.o.   MRN: [id number]  HPI 74 year old Female seen for 6 month recheck. Husband called recently saying she was having a lot of nocturia. My old records(PaperChart) indicate she had vaginal sling in 2008.   She was diagnosed with Staph epidermidis UTI in June and was treated with doxycycline 100 mg twice daily for 10 days.  In May, she  had a Serratia marcescens UTI treated with Cipro.  Patient is feeling some discomfort in her genital area.  Likely needs to be referred back to urologist for evaluation of vaginal sling and recurrent urinary infections.  Has history of essential hypertension, hypothyroidism, impaired glucose tolerance which is treated with diet, and hyperlipidemia.  Also has GE reflux treated with Nexium.  History of anxiety treated with alprazolam twice daily as needed.  Is on Femara for history of breast cancer.      Review of Systems denies fever chills nausea or vomiting.     Objective:   Physical Exam Temperature 97.9 degrees pulse oximetry 98% pulse 89 regular blood pressure 110/70.  BMI 27.87  Skin warm and dry.  No cervical adenopathy.  No thyromegaly.  No carotid bruits.  Chest clear to auscultation.  Cardiac exam regular rate and rhythm normal S1 and S2.  No lower extremity edema.  Affect thought and judgment are normal.  Urine dipstick is abnormal and culture was sent.       Assessment & Plan:  History of vaginal sling in 2008 and has had urinary tract infections that are symptomatic now and in June of this year.  She will be referred to urology for evaluation.  Has discomfort in her genital area.  She is wondering if the sling is still functioning.  Awaiting culture results before treating  History of breast cancer treated with Femara  Essential hypertension stable on current regimen  History of sinus tachycardia-now stable and under control with pulse of  89  Hyperlipidemia-stable on simvastatin.  Hypothyroidism treated with Euthyrox 75 mcg daily with normal TSH.  Addendum: August 27, 2020: she has Proteus mirabilis UTI sensitive to Cipro and will be treated with a 10-day course of Cipro with follow-up here in 2 weeks.  Will be referred to urologist. Patient notified Sunday November 14- wants Rx sent to [facility].

## 2020-08-27 LAB — URINALYSIS, MICROSCOPIC ONLY
Bacteria, UA: NONE SEEN /HPF
Hyaline Cast: NONE SEEN /LPF

## 2020-08-27 LAB — URINE CULTURE
MICRO NUMBER:: 11191409
SPECIMEN QUALITY:: ADEQUATE

## 2020-08-27 MED ORDER — CIPROFLOXACIN HCL 500 MG PO TABS: 500.0000 mg | ORAL_TABLET | Freq: Two times a day (BID) | ORAL | 0 refills | Status: DC

## 2020-08-27 NOTE — Patient Instructions (Signed)
Current medical issues are stable on current regimen with the exception of genital discomfort and another urinary tract infection this time with with Proteus mirabilis which will be treated with Cipro 500 mg twice daily for 10 days with follow-up here in 2 weeks.  She is also being referred to urologist to check vaginal sling and evaluate recurrent urinary infections.  Prescription sent to [facility] on November 14.  Continue current medications as previously prescribed.  Other medical issues are currently stable.  Physical exam and Medicare wellness exam due in 6 months.

## 2020-09-05 ENCOUNTER — Other Ambulatory Visit: Payer: Self-pay

## 2020-09-05 ENCOUNTER — Encounter: Payer: Self-pay | Admitting: Internal Medicine

## 2020-09-05 ENCOUNTER — Ambulatory Visit (INDEPENDENT_AMBULATORY_CARE_PROVIDER_SITE_OTHER): Payer: PPO | Admitting: Internal Medicine

## 2020-09-05 VITALS — BP 130/76 | HR 88 | Temp 98.7°F | Ht 59.0 in | Wt 138.0 lb

## 2020-09-05 DIAGNOSIS — N390 Urinary tract infection, site not specified: Secondary | ICD-10-CM | POA: Diagnosis not present

## 2020-09-05 DIAGNOSIS — B964 Proteus (mirabilis) (morganii) as the cause of diseases classified elsewhere: Secondary | ICD-10-CM

## 2020-09-05 LAB — POCT URINALYSIS DIPSTICK
Appearance: NEGATIVE
Bilirubin, UA: NEGATIVE
Blood, UA: NEGATIVE
Glucose, UA: NEGATIVE
Ketones, UA: NEGATIVE
Leukocytes, UA: NEGATIVE
Nitrite, UA: NEGATIVE
Odor: NEGATIVE
Protein, UA: NEGATIVE
Spec Grav, UA: 1.01 (ref 1.010–1.025)
Urobilinogen, UA: 0.2 E.U./dL
pH, UA: 6.5 (ref 5.0–8.0)

## 2020-09-08 NOTE — Patient Instructions (Signed)
Urinary tract infection is completely resolved with treatment.  Referral being made to Urologist regarding recurrent infections, history of vaginal sling and nocturia.

## 2020-09-08 NOTE — Progress Notes (Signed)
   Subjective:    Patient ID: Tara Rodgers, female    DOB: 1946-10-10, 74 y.o.   MRN: [id number]  HPI Here for recheck on UTI diagnosed on November 11th.  Culture of urine grew Proteus mirabilis sensitive to Cipro for which she received a 10-day course.  Not seen by physician today.  Seen by CMA for nurse visit.  When she was here on November 11 she was complaining of nocturia.  History of vaginal sling and recurrent urinary infections.  In May she had a Serratia marcescens UTI treated with Cipro.  Was diagnosed with staph epidermidis UTI in June and treated with doxycycline.  Likely needs follow-up on her vaginal sling and recurrent urinary tract infections by urologist.  Review of Systems see above     Objective:   Physical Exam  She is afebrile.  Her urine dipstick is completely normal.      Assessment & Plan:  Proteus mirabilis UTI-resolved with Cipro treatment course  Plan: Being referred to Urology

## 2020-09-11 ENCOUNTER — Other Ambulatory Visit: Payer: Self-pay | Admitting: Internal Medicine

## 2020-09-15 DIAGNOSIS — M25531 Pain in right wrist: Secondary | ICD-10-CM | POA: Diagnosis not present

## 2020-09-27 DIAGNOSIS — M545 Low back pain, unspecified: Secondary | ICD-10-CM | POA: Diagnosis not present

## 2020-10-03 ENCOUNTER — Other Ambulatory Visit: Payer: Self-pay | Admitting: Internal Medicine

## 2020-10-03 DIAGNOSIS — M5459 Other low back pain: Secondary | ICD-10-CM | POA: Diagnosis not present

## 2020-10-18 DIAGNOSIS — M545 Low back pain, unspecified: Secondary | ICD-10-CM | POA: Diagnosis not present

## 2020-10-24 ENCOUNTER — Telehealth: Payer: Self-pay | Admitting: Internal Medicine

## 2020-10-24 ENCOUNTER — Ambulatory Visit (HOSPITAL_COMMUNITY)
Admission: RE | Admit: 2020-10-24 | Discharge: 2020-10-24 | Disposition: A | Discharge status: Home / Self Care | Payer: Medicare Other | Source: Ambulatory Visit | Attending: Internal Medicine | Admitting: Internal Medicine

## 2020-10-24 ENCOUNTER — Ambulatory Visit (INDEPENDENT_AMBULATORY_CARE_PROVIDER_SITE_OTHER): Payer: Medicare Other | Admitting: Internal Medicine

## 2020-10-24 ENCOUNTER — Encounter: Payer: Self-pay | Admitting: Internal Medicine

## 2020-10-24 ENCOUNTER — Other Ambulatory Visit: Payer: Self-pay

## 2020-10-24 ENCOUNTER — Emergency Department (HOSPITAL_COMMUNITY)
Admission: EM | Admit: 2020-10-24 | Discharge: 2020-10-24 | Disposition: A | Discharge status: Other Acute Inpatient Hospital | Payer: Medicare Other

## 2020-10-24 VITALS — HR 88 | Temp 98.2°F | Ht 59.0 in

## 2020-10-24 DIAGNOSIS — N1831 Chronic kidney disease, stage 3a: Secondary | ICD-10-CM

## 2020-10-24 DIAGNOSIS — R0602 Shortness of breath: Secondary | ICD-10-CM | POA: Diagnosis not present

## 2020-10-24 DIAGNOSIS — R059 Cough, unspecified: Secondary | ICD-10-CM

## 2020-10-24 DIAGNOSIS — Z20822 Contact with and (suspected) exposure to covid-19: Secondary | ICD-10-CM

## 2020-10-24 DIAGNOSIS — I10 Essential (primary) hypertension: Secondary | ICD-10-CM

## 2020-10-24 DIAGNOSIS — J189 Pneumonia, unspecified organism: Secondary | ICD-10-CM

## 2020-10-24 DIAGNOSIS — R079 Chest pain, unspecified: Secondary | ICD-10-CM | POA: Diagnosis not present

## 2020-10-24 DIAGNOSIS — R7302 Impaired glucose tolerance (oral): Secondary | ICD-10-CM

## 2020-10-24 DIAGNOSIS — E039 Hypothyroidism, unspecified: Secondary | ICD-10-CM

## 2020-10-24 MED ORDER — HYDROCODONE-HOMATROPINE 5-1.5 MG/5ML PO SYRP: 5.0000 mL | ORAL_SOLUTION | Freq: Three times a day (TID) | ORAL | 0 refills | Status: DC | PRN

## 2020-10-24 MED ORDER — DOXYCYCLINE HYCLATE 100 MG PO TABS: 100.0000 mg | ORAL_TABLET | Freq: Two times a day (BID) | ORAL | 0 refills | Status: DC

## 2020-10-24 MED ORDER — CEFTRIAXONE SODIUM 1 G IJ SOLR
1.0000 g | Freq: Once | INTRAMUSCULAR | Status: AC
  Administered 2020-10-24: 1 g via INTRAMUSCULAR

## 2020-10-24 NOTE — Telephone Encounter (Signed)
Tara Rodgers [phone]  [facility] called to say she started coughing, chest started hurting on Friday, so she started using some of her coughing medicine at night that she had left over, some times she has a hard time breathigng.

## 2020-10-24 NOTE — Telephone Encounter (Signed)
Lobby visit

## 2020-10-24 NOTE — Progress Notes (Signed)
   Subjective:    Patient ID: Tara Rodgers, female    DOB: 1946-06-12, 75 y.o.   MRN: [id number]  HPI 75 year old Female with multiple medical issues including history of breast cancer, hypertension, allergic rhinitis, hyperlipidemia, GE reflux, hypothyroidism, sinus tachycardia, multinodular goiter, stress urinary incontinence and osteoarthritis seen with complaint of coughing and chest discomfort onset Friday, January 7.  Use of leftover Hycodan to help with cough.  Has had some shortness of breath.  Records indicates she has had 2 COVID-19 vaccines.  She has had malaise fatigue but no vomiting.  Was treated in November for urinary tract infection.  Was diagnosed with COVID-19 October 29, 2019.  She received COVID 19 infusion treatment at that time.  COVID-19 testing was done today here in the office.      Review of Systems complains of some joint stiffness     Objective:   Physical Exam Temperature 98.2 degrees pulse oximetry 95% but drops to 92%, pulse 88 and regular height 4 feet 11 inches BMI 27.87 She looks fatigued.  TMs are clear.  Pharynx is clear.  Chest remarkable for coarse breath sounds and rales in right lower lobe      Assessment & Plan:  History of COVID-02 November 2019  Rales right lower lobe consistent with right lower lobe pneumonia-possible COVID infection  Complaint of chest pain-do not think this is cardiac  Plan: She will be sent to [facility] for chest x-ray.  Will be started on doxycycline 100 mg twice daily for 10 days.  Was given 1 g of IM Rocephin here in the office.  Prescribed Hycodan 1 teaspoon every 8 hours as needed for cough.  Rest and drink fluids and monitor oxygen level.

## 2020-10-24 NOTE — Telephone Encounter (Signed)
scheduled

## 2020-10-24 NOTE — Patient Instructions (Addendum)
Given Rocephin 1 g IM in the office for presumed pneumonia.  To have chest x-ray at [facility] after leaving the office.  Take doxycycline 100 mg twice daily for 10 days.  Quarantine at home until test results are back.  Monitor pulse oximetry at home.

## 2020-10-25 ENCOUNTER — Telehealth: Payer: Self-pay | Admitting: Internal Medicine

## 2020-10-25 ENCOUNTER — Encounter: Payer: Self-pay | Admitting: Internal Medicine

## 2020-10-25 ENCOUNTER — Other Ambulatory Visit (INDEPENDENT_AMBULATORY_CARE_PROVIDER_SITE_OTHER): Payer: Medicare Other | Admitting: Internal Medicine

## 2020-10-25 DIAGNOSIS — R590 Localized enlarged lymph nodes: Secondary | ICD-10-CM

## 2020-10-25 NOTE — Telephone Encounter (Signed)
Morrisa from [facility] called and they need someone to call back so they can give report per radiologist.

## 2020-10-25 NOTE — Telephone Encounter (Signed)
Excellent. Thanks!

## 2020-10-25 NOTE — Telephone Encounter (Signed)
CT scanned scheduled for tomorrow at 12:30pm at [facility].

## 2020-10-25 NOTE — Telephone Encounter (Signed)
[facility] contacted me about abnormal CXR. I called patient as next step is CT with contrast. She is still SOB. Pulse ox is 95%. Likely needs BUN and Cr before CT with contrast. Covid-19 test is pending. She will come today and get BUN and Creatinine. I will order CT of chest with contrast. MJB, MD

## 2020-10-26 ENCOUNTER — Telehealth: Payer: Self-pay | Admitting: Internal Medicine

## 2020-10-26 ENCOUNTER — Ambulatory Visit
Admission: RE | Admit: 2020-10-26 | Discharge: 2020-10-26 | Disposition: A | Discharge status: Home / Self Care | Payer: Medicare Other | Source: Ambulatory Visit | Attending: Internal Medicine | Admitting: Internal Medicine

## 2020-10-26 DIAGNOSIS — J90 Pleural effusion, not elsewhere classified: Secondary | ICD-10-CM | POA: Diagnosis not present

## 2020-10-26 DIAGNOSIS — I700 Atherosclerosis of aorta: Secondary | ICD-10-CM | POA: Diagnosis not present

## 2020-10-26 DIAGNOSIS — I2510 Atherosclerotic heart disease of native coronary artery without angina pectoris: Secondary | ICD-10-CM | POA: Diagnosis not present

## 2020-10-26 DIAGNOSIS — J9811 Atelectasis: Secondary | ICD-10-CM | POA: Diagnosis not present

## 2020-10-26 LAB — CBC WITH DIFFERENTIAL/PLATELET
Absolute Monocytes: 941 cells/uL (ref 200–950)
Basophils Absolute: 69 cells/uL (ref 0–200)
Basophils Relative: 0.7 %
Eosinophils Absolute: 168 cells/uL (ref 15–500)
Eosinophils Relative: 1.7 %
HCT: 35.9 % (ref 35.0–45.0)
Hemoglobin: 11.9 g/dL (ref 11.7–15.5)
Lymphs Abs: 3049 cells/uL (ref 850–3900)
MCH: 28.6 pg (ref 27.0–33.0)
MCHC: 33.1 g/dL (ref 32.0–36.0)
MCV: 86.3 fL (ref 80.0–100.0)
MPV: 10.3 fL (ref 7.5–12.5)
Monocytes Relative: 9.5 %
Neutro Abs: 5673 cells/uL (ref 1500–7800)
Neutrophils Relative %: 57.3 %
Platelets: 334 10*3/uL (ref 140–400)
RBC: 4.16 10*6/uL (ref 3.80–5.10)
RDW: 13 % (ref 11.0–15.0)
Total Lymphocyte: 30.8 %
WBC: 9.9 10*3/uL (ref 3.8–10.8)

## 2020-10-26 LAB — BUN/CREATININE RATIO
BUN/Creatinine Ratio: 16 (calc) (ref 6–22)
BUN: 20 mg/dL (ref 7–25)
Creat: 1.23 mg/dL — ABNORMAL HIGH (ref 0.60–0.93)
GFR, Est African American: 50 mL/min/{1.73_m2} — ABNORMAL LOW (ref >=60)
GFR, Est Non African American: 43 mL/min/{1.73_m2} — ABNORMAL LOW (ref >=60)

## 2020-10-26 LAB — SARS-COV-2 RNA,(COVID-19) QUALITATIVE NAAT: SARS CoV2 RNA: NOT DETECTED

## 2020-10-26 LAB — BASIC METABOLIC PANEL
BUN/Creatinine Ratio: 16 (calc) (ref 6–22)
BUN: 20 mg/dL (ref 7–25)
CO2: 25 mmol/L (ref 20–32)
Calcium: 9.6 mg/dL (ref 8.6–10.4)
Chloride: 106 mmol/L (ref 98–110)
Creat: 1.23 mg/dL — ABNORMAL HIGH (ref 0.60–0.93)
Glucose, Bld: 98 mg/dL (ref 65–99)
Potassium: 5 mmol/L (ref 3.5–5.3)
Sodium: 143 mmol/L (ref 135–146)

## 2020-10-26 MED ORDER — IOPAMIDOL (ISOVUE-300) INJECTION 61%
75.0000 mL | Freq: Once | INTRAVENOUS | Status: AC | PRN
  Administered 2020-10-26: 75 mL via INTRAVENOUS

## 2020-10-26 NOTE — Telephone Encounter (Signed)
LVM for Santiago Glad Dr [name] nurse to call me about abnormal CT for patient.

## 2020-10-27 ENCOUNTER — Other Ambulatory Visit: Payer: Self-pay

## 2020-10-27 ENCOUNTER — Inpatient Hospital Stay: Payer: Medicare Other | Attending: Hematology | Admitting: Hematology

## 2020-10-27 VITALS — BP 127/68 | HR 102 | Temp 98.1°F | Resp 15 | Ht 59.0 in | Wt 138.5 lb

## 2020-10-27 DIAGNOSIS — Z853 Personal history of malignant neoplasm of breast: Secondary | ICD-10-CM | POA: Diagnosis not present

## 2020-10-27 DIAGNOSIS — C7800 Secondary malignant neoplasm of unspecified lung: Secondary | ICD-10-CM | POA: Insufficient documentation

## 2020-10-27 DIAGNOSIS — C50411 Malignant neoplasm of upper-outer quadrant of right female breast: Secondary | ICD-10-CM | POA: Insufficient documentation

## 2020-10-27 DIAGNOSIS — C772 Secondary and unspecified malignant neoplasm of intra-abdominal lymph nodes: Secondary | ICD-10-CM

## 2020-10-27 DIAGNOSIS — K869 Disease of pancreas, unspecified: Secondary | ICD-10-CM | POA: Diagnosis not present

## 2020-10-27 DIAGNOSIS — C50911 Malignant neoplasm of unspecified site of right female breast: Secondary | ICD-10-CM

## 2020-10-27 DIAGNOSIS — M810 Age-related osteoporosis without current pathological fracture: Secondary | ICD-10-CM | POA: Diagnosis not present

## 2020-10-27 DIAGNOSIS — Z923 Personal history of irradiation: Secondary | ICD-10-CM | POA: Diagnosis not present

## 2020-10-27 DIAGNOSIS — Z79811 Long term (current) use of aromatase inhibitors: Secondary | ICD-10-CM | POA: Diagnosis not present

## 2020-10-27 DIAGNOSIS — I10 Essential (primary) hypertension: Secondary | ICD-10-CM | POA: Diagnosis not present

## 2020-10-27 DIAGNOSIS — M8580 Other specified disorders of bone density and structure, unspecified site: Secondary | ICD-10-CM | POA: Insufficient documentation

## 2020-10-27 DIAGNOSIS — R0600 Dyspnea, unspecified: Secondary | ICD-10-CM | POA: Diagnosis not present

## 2020-10-27 DIAGNOSIS — Z170 Estrogen receptor positive status [ER+]: Secondary | ICD-10-CM | POA: Diagnosis not present

## 2020-10-27 DIAGNOSIS — C787 Secondary malignant neoplasm of liver and intrahepatic bile duct: Secondary | ICD-10-CM | POA: Diagnosis not present

## 2020-10-27 DIAGNOSIS — M1990 Unspecified osteoarthritis, unspecified site: Secondary | ICD-10-CM | POA: Insufficient documentation

## 2020-10-27 NOTE — Progress Notes (Signed)
[facility]   [phone] Fax:([phone]) [phone]   Clinic Follow up Note   Patient Care Team: [name], [name] [name], MD as PCP - General (Internal Medicine) [name], MD as Consulting Physician (Hematology) [name], MD (Inactive) as Consulting Physician (General Surgery) [name], NP as Nurse Practitioner (Nurse Practitioner) [name], MD as Consulting Physician (Plastic Surgery) [name], MD as Consulting Physician (Orthopedic Surgery)  Date of Service:  10/27/2020  CHIEF COMPLAINT: dyspnea and fatigue   SUMMARY OF ONCOLOGIC HISTORY: Oncology History Overview Note    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) and adjuvant radiation ([name]).   12/13/2014 Mammogram   Right breast: possible asymmetry warranting further evaluation with spot compression views and possibly ultrasound   12/16/2014 Breast US   Right breast: no definitive abnormality within the superior aspect of the right breast to correspond with the mammographic finding.   01/10/2015 Breast MRI   Right breast: irregular rim enhancing mass within the superior right breast at 11:30 measuring 2.5 x 2.4 x 2.4 cm. No abnormal enhancement is identified within the skin of the superior right breast in the area of concern on mammogram.   01/12/2015 Breast US   Second look diagnostic mammogram and ultrasound showed a 2.3 cm mass in the upper midline right breast. No axillary adenopathy.   01/12/2015 Initial Biopsy   Right breast needle core bx (upper  midline): Invasive ductal carcinoma, grade 2, ER+ (100%), PR+ (77%), HER2/neu negative (ratio 1.15), Ki67 20%    01/30/2015 Procedure   Breast High/Moderate Risk panel ([facility]) reveals no clinically significant variant at ATM, BRCA1, BRCA2, CDH1, CHEK2, PALB2, PTEN, STK11, and TP53.   02/08/2015 Clinical Stage   Stage IIA (T2 N0)   03/23/2015 Definitive Surgery   Right mastectomy ([name]): invasive adenocarcinoma, grade 3, 2.9 cm, HER2/neu repeated, negative (ratio 1.23)   03/23/2015 Oncotype testing   Score: 8 (6% ROR). No chemotherapy [name]).   03/23/2015 Pathologic Stage   Stage IIA: pT2 pNx   05/04/2015 -  Anti-estrogen oral therapy   Letrozole 2.77m daily. Planned duration of therapy at least 5 years.    07/25/2015 Survivorship   Survivorship visit completed and copy of care plan provided to patient.   12/15/2015 Mammogram   IMPRESSION: No mammographic evidence of malignancy. A result letter of this screening mammogram will be mailed directly to the patient.   12/23/2016 Mammogram   IMPRESSION: No mammographic evidence of malignancy. A result letter of this screening mammogram will be mailed directly to the patient.    12/30/2017 Mammogram   12/30/2017 Mammogram IMPRESSION: No mammographic evidence of malignancy. A result letter of this screening mammogram will be mailed directly to the patient.   03/27/2020 Mammogram   IMPRESSION: No mammographic evidence of malignancy. A result letter of this screening mammogram will be mailed directly to the patient.   10/26/2020 Imaging   CT chest IMPRESSION: 1. Bulky mediastinal, thoracic inlet, right supraclavicular, and left axillary lymphadenopathy consistent with metastatic disease. Associated numerous bilateral pulmonary nodules and masses, most prominently in the right  middle lobe with dominant 4.1 cm peripheral mass lesion. 2. 16 mm rim enhancing lesion posterior right liver consistent with metastatic involvement. 3. 13 mm  hypoenhancing lesion in the tail of pancreas. Primary neoplasm or metastatic involvement could have this appearance. 4. Multiple nodules in the upper abdomen concerning for peritoneal/mesenteric metastatic disease. 5. Small right and tiny left pleural effusions with bibasilar atelectasis. 6. Aortic Atherosclerosis (ICD10-I70.0).      CURRENT THERAPY:  Letrozole 2.5 mg once daily, started on 05/04/2015  INTERVAL HISTORY:  Tara Rodgers is here for a follow up of right breast cancer. She was last seen by me 7 months ago. She presents to the clinic alone. She has been having respiratory symptoms and seeing her PCP. She did have COVID in January 10/2019 and did have breathing issues since. She notes her breathing has recently gotten worse in her chest and labored progressing for the past 3 months. She cannot walk too far without stopping to catch her breath since last week. She notes pain in lower and right chest which is worsening. She went to ED on 10/25/19. Chest Xray on 1/11 indicated right lung mass. Her CT Chest on 1/13 showed multiple lung masses and chest lymphadenopathy. For her chest pain she was started on Doxycycline and Hycodan for cough which has helped. She denies h/o smoking     REVIEW OF SYSTEMS:   Constitutional: Denies fevers, chills or abnormal weight loss Eyes: Denies blurriness of vision Ears, nose, mouth, throat, and face: Denies mucositis or sore throat Respiratory:  (+) SOB (+) Cough, dry and mild (+) mid and right chest/flank pain  Cardiovascular: Denies palpitation, chest discomfort or lower extremity swelling Gastrointestinal:  Denies nausea, heartburn or change in bowel habits Skin: Denies abnormal skin rashes Lymphatics: Denies new lymphadenopathy or easy bruising Neurological:Denies numbness, tingling or new weaknesses Behavioral/Psych: Mood is stable, no new changes  All other systems were reviewed with the patient and are negative.  MEDICAL HISTORY:  Past  Medical History:  Diagnosis Date  . Anxiety   . Asthma    does not take any medications, hx of inhaler use  . Breast cancer [facility]) 1995/2016   right sided breast cancer in 08/1994  . Bronchitis    hx of  . GERD (gastroesophageal reflux disease)   . History of hiatal hernia   . Hypertension   . Hyperthyroidism     SURGICAL HISTORY: Past Surgical History:  Procedure Laterality Date  . BREAST BIOPSY Right 2016   malignant  . BREAST EXCISIONAL BIOPSY Left 1996   benign  . Carpel Tunnel  Bilateral   . INCONTINENCE SURGERY     with mesh insertion about 7 years ago  . LATISSIMUS FLAP TO BREAST Right 03/23/2015   Procedure: LATISSIMUS FLAP TO BREAST WITH PLACEMENT OF IMPLANT FOR BREAST RECONSTRUCTION;  Surgeon: [name], MD;  Location: [facility];  Service: Plastics;  Laterality: Right;  . MASTECTOMY Right 03/23/2015  . RECONSTRUCTION BREAST W/ LATISSIMUS DORSI FLAP Right 03/23/2015  . TONSILLECTOMY     as a child  . TOTAL KNEE ARTHROPLASTY Right 03/13/2018   Procedure: RIGHT TOTAL KNEE ARTHROPLASTY;  Surgeon: Sydnee Cabal, MD;  Location: [facility];  Service: Orthopedics;  Laterality: Right;  Adductor Block  . TOTAL MASTECTOMY Right 03/23/2015   Procedure: RIGHT TOTAL MASTECTOMY;  Surgeon: [name], MD;  Location: [facility];  Service: General;  Laterality: Right;    I have reviewed the social history and family history with the patient and they  are unchanged from previous note.  ALLERGIES:  is allergic to fosamax [alendronate sodium].  MEDICATIONS:  Current Outpatient Medications  Medication Sig Dispense Refill  . ALPRAZolam (XANAX) 0.5 MG tablet Take 1 tablet by mouth twice daily as needed 180 tablet 0  . cyclobenzaprine (FLEXERIL) 10 MG tablet Take 1 tablet (10 mg total) by mouth 3 (three) times daily as needed for muscle spasms. 30 tablet 2  . doxycycline (VIBRA-TABS) 100 MG tablet Take 1 tablet (100 mg total) by mouth 2 (two) times daily. 20 tablet 0  . esomeprazole (NEXIUM)  40 MG capsule One po daily at bedtime 90 capsule 1  . EUTHYROX 75 MCG tablet Take 1 tablet by mouth daily 90 tablet 0  . HYDROcodone-homatropine (HYCODAN) 5-1.5 MG/5ML syrup Take 5 mLs by mouth every 8 (eight) hours as needed for cough. 120 mL 0  . letrozole (FEMARA) 2.5 MG tablet Take 1 tablet (2.5 mg total) by mouth daily. 90 tablet 3  . metoprolol succinate (TOPROL-XL) 50 MG 24 hr tablet Take 1 tablet (50 mg total) by mouth 2 (two) times daily. 180 tablet 3  . simvastatin (ZOCOR) 40 MG tablet TAKE 1 TABLET BY MOUTH AT BEDTIME 90 tablet 0  . verapamil (CALAN-SR) 240 MG CR tablet Take 1 tablet by mouth once daily 90 tablet 0   No current facility-administered medications for this visit.    PHYSICAL EXAMINATION: ECOG PERFORMANCE STATUS: 3 - Symptomatic, >50% confined to bed  Vitals:   10/27/20 1302  BP: 127/68  Pulse: (!) 102  Resp: 15  Temp: 98.1 F (36.7 C)  SpO2: 100%   Filed Weights   10/27/20 1302  Weight: 138 lb 8 oz (62.8 kg)    GENERAL:alert, no distress and comfortable SKIN: skin color, texture, turgor are normal, no rashes or significant lesions EYES: normal, Conjunctiva are pink and non-injected, sclera clear  NECK: supple, thyroid normal size, non-tender  LYMPH:  no palpable lymphadenopathy in the axillary (+) 2x2cm palpable supraclavicular LN LUNGS: clear to auscultation and percussion with normal breathing effort HEART: regular rate & rhythm and no murmurs and no lower extremity edema ABDOMEN:abdomen soft, non-tender and normal bowel sounds (+) Mild right flank tenderness  Musculoskeletal:no cyanosis of digits and no clubbing  NEURO: alert & oriented x 3 with fluent speech, no focal motor/sensory deficits  LABORATORY DATA:  I have reviewed the data as listed CBC Latest Ref Rng & Units 10/25/2020 02/14/2020 10/11/2019  WBC 3.8 - 10.8 Thousand/uL 9.9 7.4 7.8  Hemoglobin 11.7 - 15.5 g/dL 11.9 11.7 11.9  Hematocrit 35.0 - 45.0 % 35.9 36.4 35.4  Platelets 140 - 400  Thousand/uL 334 249 295     CMP Latest Ref Rng & Units 10/25/2020 10/25/2020 08/21/2020  Glucose 65 - 99 mg/dL - 98 -  BUN 7 - 25 mg/dL 20 20 -  Creatinine 0.60 - 0.93 mg/dL 1.23(H) 1.23(H) -  Sodium 135 - 146 mmol/L - 143 -  Potassium 3.5 - 5.3 mmol/L - 5.0 -  Chloride 98 - 110 mmol/L - 106 -  CO2 20 - 32 mmol/L - 25 -  Calcium 8.6 - 10.4 mg/dL - 9.6 -  Total Protein 6.1 - 8.1 g/dL - - 6.6  Total Bilirubin 0.2 - 1.2 mg/dL - - 0.4  Alkaline Phos 38 - 126 U/L - - -  AST 10 - 35 U/L - - 18  ALT 6 - 29 U/L - - 17      RADIOGRAPHIC STUDIES: I have personally reviewed the  radiological images as listed and agreed with the findings in the report. CT Chest W Contrast  Result Date: 10/26/2020 CLINICAL DATA:  Mediastinal lymphadenopathy. EXAM: CT CHEST WITH CONTRAST TECHNIQUE: Multidetector CT imaging of the chest was performed during intravenous contrast administration. CONTRAST:  8m ISOVUE-300 IOPAMIDOL (ISOVUE-300) INJECTION 61% COMPARISON:  Chest x-ray 10/24/2020 FINDINGS: Cardiovascular: The heart size is normal. No substantial pericardial effusion. Atherosclerotic calcification is noted in the wall of the thoracic aorta. Coronary artery calcification is evident. Mediastinum/Nodes: Bulky mediastinal lymphadenopathy evident. Heterogeneously enhancing nodal conglomeration anterior mediastinum measures 5.6 x 3.8 cm and extends up into the left thoracic inlet where it attenuates but does not obstruct the left innominate vein. 3.9 x 2.2 cm nodal conglomeration identified in the right paratracheal station on 43/2. Subcarinal heterogeneously enhancing mass lesion measures 3.9 x 2.6 cm. 10 mm short axis right hilar lymph node evident. The esophagus has normal imaging features. 16 mm short axis lymph node identified left axilla. No right axillary lymphadenopathy. 15 mm short axis right supraclavicular node on image 1/series 2 has been incompletely visualized. Lungs/Pleura: Numerous scattered tiny  bilateral pulmonary nodules identified measuring from about 3 mm up to approximately 6-7 mm. Several of the more dominant nodules are seen along fissures of the right lung. 2.0 x 1.2 cm pleural base nodule noted right middle lobe on 67/5 with another adjacent/confluent peripheral right middle lobe mass on 81/5 measuring 4.1 x 1.9 cm. Small right and tiny left pleural effusions evident. There is basilar atelectasis in the lower lobes bilaterally. Upper Abdomen: 16 mm rim enhancing lesion posterior right liver on 105/2. 6 mm soft tissue nodule identified right paramidline preperitoneal space on 01/30 6/2. Possible 7 mm mesenteric nodule posterior to the stomach on 140/2. 7 mm nodule seen adjacent to the left liver on 01/20 6/2. 13 mm hypoenhancing lesion identified in the tail of pancreas on 130/2. Musculoskeletal: No worrisome lytic or sclerotic osseous abnormality. IMPRESSION: 1. Bulky mediastinal, thoracic inlet, right supraclavicular, and left axillary lymphadenopathy consistent with metastatic disease. Associated numerous bilateral pulmonary nodules and masses, most prominently in the right middle lobe with dominant 4.1 cm peripheral mass lesion. 2. 16 mm rim enhancing lesion posterior right liver consistent with metastatic involvement. 3. 13 mm hypoenhancing lesion in the tail of pancreas. Primary neoplasm or metastatic involvement could have this appearance. 4. Multiple nodules in the upper abdomen concerning for peritoneal/mesenteric metastatic disease. 5. Small right and tiny left pleural effusions with bibasilar atelectasis. 6. Aortic Atherosclerosis (ICD10-I70.0). Electronically Signed   By: EMisty StanleyM.D.   On: 10/26/2020 12:54     ASSESSMENT & PLAN:  JSHYNE RESCHis a 75y.o. female with    1. 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 In the  past 3 months she had progressive SOB and mild cough. She also has increasing right chest/flank pain. She went to ED on 1/11 given symptoms, Chest Xray showed indication of lung mass.  -I personally reviewed her CT chest from 10/26/20 with pt which showed numerous bilateral pulmonary nodules and masses, most prominently in the right middle lobe with dominant 4.1 cm mass with Bulky mediastinal, thoracic inlet, right supraclavicular, and left axillary lymphadenopathy consistent with metastatic disease. Scan also shows 16 mm lesion in right liver along with Multiple nodules in the upper abdomen concerning for metastatic disease. There is also a 13 mm hypoenhancing lesion in  the tail of pancreas.  This is highly concerning for metastatic disease, especially lung or pancreatic primary, less likely but still possible breast cancer recurrence. -I recommend PET scan for better evaluation of disease involvement, especially the lesion in pancreas to see if less likely malignant. She is agreeable.  -She has a large palpable right supraclavicular lymph nodes which I recommend biopsy, as it is palpable on exam and most easy to access. This will help determine diagnosis and the origin of primary. She is agreeable.  -F/u in 2-3 days after biopsy    2. Recurrent right breast invasive ductal carcinoma, pT2N0, stage II, grade 3, ER positive, PR positive, HER-2 negative -She was initiallydiagnosed in 1996 with right breast cancer. She was treated with right breast lumpectomy, adjuvant 6 months chemo with CMF and adjuvant radiation.  -She was diagnosed with cancer recurrence in 12/2014. She iss/prightmastectomy with completed resection.Oncotype showed low risk diseaseso I did not recommend adjuvantchemotherapy. -She started anti-estrogen therapy withletrozolein 04/2015. Plan for total of 7-10 years. Tolerating well. -Continue Surveillance. Next Mammogram in 03/2021.    3. Osteopenia -Shepreviouslyhad  Osteoporosis. Shewasgetting Prolia at her PCP's office Every 6 months,she stopped due to high co-payafter about 2 years. She notes she did not tolerate oral Alendronate so she stopped after 5 years -Her DEXA from 9/30/19sheimprovedto osteopeniawithlowest T-Score of -2.2 at [facility] with fracture risk of 20% to spine and 6.4% risk of hip fracture.Her 03/27/20 DEXA showed Osteopenia with -2.3 at right hip.  -She previously declined bisphosphonate with Zometa injections. She has had at least 7 years of bisphosphonateand Proliain the past, which is adequate.  -continue calcium 1 g, vitamin D at least 1000 units daily, and weight-bearing exercise distress or bone.  4. Comorbidities: Arthritis, HTN, Tachycardia  -Shehad a right knee replacement in May 2019 -She continues   5. Social Support  -She notes her daughter and husband know she is here but not aware of her scan results.  -I encouraged her to bring at least one of them to her next vitis. I am willing to contact them and discuss this.  -I offered her the chance to speak with our SW for counseling. She declined for now.    PLAN -PET scan next week  -IR RIGHT [place] [address] biopsy next week  -f/u a few days after scan and biopsy    No problem-specific Assessment & Plan notes found for this encounter.   Orders Placed This Encounter  Procedures  . NM PET Image Initial (PI) Skull Base To Thigh    Standing Status:   Future    Standing Expiration Date:   10/28/2021    Order Specific Question:   If indicated for the ordered procedure, I authorize the administration of a radiopharmaceutical per Radiology protocol    Answer:   Yes    Order Specific Question:   Preferred imaging location?    Answer:   [facility]  . [place] CORE BIOPSY (LYMPH NODES)    Standing Status:   Standing    Number of Occurrences:   1    Order Specific Question:   Lab orders requested (DO NOT place separate lab orders, these will be automatically ordered during  procedure specimen collection):    Answer:   Surgical Pathology    Order Specific Question:   Can the patient sign their own consent?    Answer:   Yes    Order Specific Question:   Release to patient    Answer:   Immediate  Order Specific Question:   Symptom/Reason for Exam    Answer:   Dyspnea [[id number]]   All questions were answered. The patient knows to call the clinic with any problems, questions or concerns. No barriers to learning was detected. The total time spent in the appointment was 40 minutes.     [name], MD 10/27/2020   I, Joslyn Devon, am acting as scribe for [name], MD.   I have reviewed the above documentation for accuracy and completeness, and I agree with the above.

## 2020-10-28 ENCOUNTER — Encounter: Payer: Self-pay | Admitting: Hematology

## 2020-10-28 NOTE — Progress Notes (Signed)
She was not seen in the ED.  From my office, she was supposed to go directly to [facility] Radiology. We were able to intervene at Triage by phone and get her to Radiology. I have been sending suspected Covid patients to [facility] Radiology as some of the out-patient radiology facilities have strict criteria now for suspected Covid patients. I heard rales in her lower lung and sent her for CXR. I am completely shocked. Saw her not long ago and she did not complain of SOB. Thank you for seeing her so quickly.

## 2020-11-02 ENCOUNTER — Telehealth: Payer: Self-pay | Admitting: Internal Medicine

## 2020-11-02 DIAGNOSIS — C3490 Malignant neoplasm of unspecified part of unspecified bronchus or lung: Secondary | ICD-10-CM

## 2020-11-02 HISTORY — DX: Malignant neoplasm of unspecified part of unspecified bronchus or lung: C34.90

## 2020-11-02 NOTE — Telephone Encounter (Signed)
[name] [phone]  Tara Rodgers called to say she is getting better, she said she will take her last  doxycycline (VIBRA-TABS) 100 MG tablet tomorrow night, does she need to take another round so that she gets completely better.

## 2020-11-03 NOTE — Telephone Encounter (Signed)
Called patient to let her know what Dr Renold Genta said, she verbalized understanding.

## 2020-11-03 NOTE — Telephone Encounter (Signed)
Let's see how it goes over the weekend. Follow up here next week if not well.

## 2020-11-08 ENCOUNTER — Inpatient Hospital Stay (HOSPITAL_BASED_OUTPATIENT_CLINIC_OR_DEPARTMENT_OTHER): Payer: Medicare Other | Admitting: Medical

## 2020-11-08 ENCOUNTER — Ambulatory Visit (HOSPITAL_COMMUNITY)
Admission: RE | Admit: 2020-11-08 | Discharge: 2020-11-08 | Disposition: A | Discharge status: Home / Self Care | Payer: Medicare Other | Source: Ambulatory Visit | Attending: Medical | Admitting: Medical

## 2020-11-08 ENCOUNTER — Telehealth: Payer: Self-pay

## 2020-11-08 ENCOUNTER — Other Ambulatory Visit: Payer: Self-pay

## 2020-11-08 ENCOUNTER — Encounter: Payer: Self-pay | Admitting: Medical

## 2020-11-08 ENCOUNTER — Telehealth: Payer: Self-pay | Admitting: Internal Medicine

## 2020-11-08 ENCOUNTER — Inpatient Hospital Stay: Payer: Medicare Other

## 2020-11-08 VITALS — BP 133/78 | HR 97 | Temp 97.6°F | Resp 18 | Ht 59.0 in | Wt 139.7 lb

## 2020-11-08 DIAGNOSIS — C7800 Secondary malignant neoplasm of unspecified lung: Secondary | ICD-10-CM | POA: Diagnosis not present

## 2020-11-08 DIAGNOSIS — Z853 Personal history of malignant neoplasm of breast: Secondary | ICD-10-CM | POA: Diagnosis not present

## 2020-11-08 DIAGNOSIS — R0902 Hypoxemia: Secondary | ICD-10-CM | POA: Diagnosis not present

## 2020-11-08 DIAGNOSIS — R071 Chest pain on breathing: Secondary | ICD-10-CM | POA: Diagnosis not present

## 2020-11-08 DIAGNOSIS — R0600 Dyspnea, unspecified: Secondary | ICD-10-CM

## 2020-11-08 DIAGNOSIS — C50411 Malignant neoplasm of upper-outer quadrant of right female breast: Secondary | ICD-10-CM | POA: Diagnosis not present

## 2020-11-08 DIAGNOSIS — M1990 Unspecified osteoarthritis, unspecified site: Secondary | ICD-10-CM | POA: Diagnosis not present

## 2020-11-08 DIAGNOSIS — R1319 Other dysphagia: Secondary | ICD-10-CM | POA: Diagnosis not present

## 2020-11-08 DIAGNOSIS — R0602 Shortness of breath: Secondary | ICD-10-CM | POA: Diagnosis not present

## 2020-11-08 DIAGNOSIS — M8580 Other specified disorders of bone density and structure, unspecified site: Secondary | ICD-10-CM | POA: Diagnosis not present

## 2020-11-08 DIAGNOSIS — Z170 Estrogen receptor positive status [ER+]: Secondary | ICD-10-CM | POA: Diagnosis not present

## 2020-11-08 DIAGNOSIS — C50911 Malignant neoplasm of unspecified site of right female breast: Secondary | ICD-10-CM

## 2020-11-08 DIAGNOSIS — Z923 Personal history of irradiation: Secondary | ICD-10-CM | POA: Diagnosis not present

## 2020-11-08 DIAGNOSIS — K869 Disease of pancreas, unspecified: Secondary | ICD-10-CM | POA: Diagnosis not present

## 2020-11-08 DIAGNOSIS — R918 Other nonspecific abnormal finding of lung field: Secondary | ICD-10-CM | POA: Diagnosis not present

## 2020-11-08 DIAGNOSIS — C787 Secondary malignant neoplasm of liver and intrahepatic bile duct: Secondary | ICD-10-CM | POA: Diagnosis not present

## 2020-11-08 DIAGNOSIS — R0609 Other forms of dyspnea: Secondary | ICD-10-CM

## 2020-11-08 DIAGNOSIS — Z79811 Long term (current) use of aromatase inhibitors: Secondary | ICD-10-CM | POA: Diagnosis not present

## 2020-11-08 DIAGNOSIS — I10 Essential (primary) hypertension: Secondary | ICD-10-CM | POA: Diagnosis not present

## 2020-11-08 DIAGNOSIS — R590 Localized enlarged lymph nodes: Secondary | ICD-10-CM

## 2020-11-08 DIAGNOSIS — R911 Solitary pulmonary nodule: Secondary | ICD-10-CM | POA: Diagnosis not present

## 2020-11-08 DIAGNOSIS — J90 Pleural effusion, not elsewhere classified: Secondary | ICD-10-CM | POA: Diagnosis not present

## 2020-11-08 LAB — SAMPLE TO BLOOD BANK

## 2020-11-08 LAB — CMP (CANCER CENTER ONLY)
ALT: 19 U/L (ref 0–44)
AST: 42 U/L — ABNORMAL HIGH (ref 15–41)
Albumin: 3 g/dL — ABNORMAL LOW (ref 3.5–5.0)
Alkaline Phosphatase: 170 U/L — ABNORMAL HIGH (ref 38–126)
Anion gap: 9 (ref 5–15)
BUN: 13 mg/dL (ref 8–23)
CO2: 24 mmol/L (ref 22–32)
Calcium: 8.4 mg/dL — ABNORMAL LOW (ref 8.9–10.3)
Chloride: 104 mmol/L (ref 98–111)
Creatinine: 1.01 mg/dL — ABNORMAL HIGH (ref 0.44–1.00)
GFR, Estimated: 59 mL/min — ABNORMAL LOW (ref >60)
Glucose, Bld: 105 mg/dL — ABNORMAL HIGH (ref 70–99)
Potassium: 4.2 mmol/L (ref 3.5–5.1)
Sodium: 137 mmol/L (ref 135–145)
Total Bilirubin: 0.4 mg/dL (ref 0.3–1.2)
Total Protein: 6.6 g/dL (ref 6.5–8.1)

## 2020-11-08 LAB — CBC WITH DIFFERENTIAL (CANCER CENTER ONLY)
Abs Immature Granulocytes: 0.08 10*3/uL — ABNORMAL HIGH (ref 0.00–0.07)
Basophils Absolute: 0.1 10*3/uL (ref 0.0–0.1)
Basophils Relative: 1 %
Eosinophils Absolute: 0.1 10*3/uL (ref 0.0–0.5)
Eosinophils Relative: 1 %
HCT: 33.5 % — ABNORMAL LOW (ref 36.0–46.0)
Hemoglobin: 10.9 g/dL — ABNORMAL LOW (ref 12.0–15.0)
Immature Granulocytes: 1 %
Lymphocytes Relative: 12 %
Lymphs Abs: 1.5 10*3/uL (ref 0.7–4.0)
MCH: 28.2 pg (ref 26.0–34.0)
MCHC: 32.5 g/dL (ref 30.0–36.0)
MCV: 86.8 fL (ref 80.0–100.0)
Monocytes Absolute: 1 10*3/uL (ref 0.1–1.0)
Monocytes Relative: 9 %
Neutro Abs: 9.1 10*3/uL — ABNORMAL HIGH (ref 1.7–7.7)
Neutrophils Relative %: 76 %
Platelet Count: 314 10*3/uL (ref 150–400)
RBC: 3.86 MIL/uL — ABNORMAL LOW (ref 3.87–5.11)
RDW: 13.9 % (ref 11.5–15.5)
WBC Count: 11.8 10*3/uL — ABNORMAL HIGH (ref 4.0–10.5)
nRBC: 0 % (ref 0.0–0.2)

## 2020-11-08 MED ORDER — HYDROCODONE-ACETAMINOPHEN 5-325 MG PO TABS: ORAL_TABLET | ORAL | 0 refills | Status: DC

## 2020-11-08 NOTE — Telephone Encounter (Signed)
Ms Fletchers's daughter laura called stating the Ms Perl is experiencing increasing dysnea, she is sleeping most of the day and night, she "passed Out" and woke up on her kitchen floor.  She feels like she is choking when she tries to eat.  She is unable to take a deep breath without coughing.  Reviewed with Dr [name] patient to be evaluated in [facility] with Sandi Mealy.

## 2020-11-08 NOTE — Progress Notes (Addendum)
[facility] Progress Note   Tara Rodgers [id number] 05/11/1946 75 y.o.  Tara Rodgers is managed by Dr. [name]  Actively treated with chemotherapy/immunotherapy/hormonal therapy: yes  Current therapy: Letrozole 2.5 mg once daily.  Next scheduled appointment with provider: To be arranged following an upcoming PET scan on 11/10/2020 and a planned biopsy.  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 as follows:  SPO2 on Room Air/At Rest:   98% SPO2 on Room Air/At Exertion:  86% SPO2 on Oxygen at 2 LPM/At Exertion:  98%  Home Oxygen Concentrator and Portable Oxygen Tanks Via Nasal Cannula  2 LPM  Continuous  Chest pain with breathing: [name] was given a prescription for Norco 5-325, 1/2 to 1 tablet PO Q 6 hours prn pain.  [name] was given a handout regarding constipation management.   Esophageal dysphagia: I spoke with [name] and told her that it is likely that her dysphagia is from her bulky mediastinal lymphadenopathy.  It was suggested to her that she cut up her food into multiple small pieces or begin a soft mechanical diet.  Dr. [name] is contacting radiation oncology to asked that the patient be seen to begin radiation therapy in hopes of reducing her bulky mediastinal lymphadenopathy which is likely causing both her dysphagia and dyspnea.   Infiltrating ductal carcinoma of the right breast: [name] continues to be managed by Dr. [name] and is currently on letrozole.  She was initially scheduled to return for follow-up in June  but will be returning after her PET scan and a planned biopsy of a right supraclavicular lymph node are completed in hopes that pathologic diagnosis has been made for treatment planning.  She continues on letrozole 2.5 mg once daily for now.   Bulky mediastinal lymphadenopathy: As noted above, Dr. [name] is contacting radiation oncology to asked that the patient be seen to begin radiation therapy in hopes of reducing her bulky mediastinal lymphadenopathy which is likely causing both her dysphagia and dyspnea.   Bulky right supraclavicular lymphadenopathy: [name] is pending a PET scan and a planned biopsy of a right supraclavicular lymph node are completed in hopes that pathologic diagnosis has been made for treatment planning.  She will be contacted to schedule an appointment for follow-up once these have been completed.   Please see After Visit Summary for patient specific instructions.  Future Appointments  Date Time Provider [place]  11/10/2020 12:00 PM [facility] PET CT 1 [facility]  02/15/2021  9:00 AM [name], [name] [name], MD [facility]  02/19/2021  3:00 PM Elby Showers, MD [facility]  03/29/2021  2:15 PM [facility] None  03/29/2021  2:45 PM Alla Feeling, NP [facility] None    Orders Placed This Encounter  Procedures  . For home use only DME oxygen       Subjective:   Patient ID:  Tara Rodgers is a 75 y.o. (DOB [birth year]) female.  Chief Complaint: No chief complaint on file.   HPI Tara Rodgers  is a 75 y.o. female with a diagnosis of  Infiltrating ductal carcinoma of the right breast. She is followed by Dr. [name] and is currently on letrozole.  She presents to the clinic today with her daughter.  She is being seen for chest pain with increasing shortness of breath and dyspnea on exertion.  She is pending a PET scan and a planned biopsy of a right supraclavicular lymph node.  She had a chest x-ray completed today which returned as  follows:  FINDINGS: Trachea is midline.  Heart size stable.  Bulky mediastinal adenopathy. Small bilateral pleural effusions with associated bibasilar volume loss.  Bilateral pulmonary nodules and right middle lobe mass, better seen on 10/26/2020 CT.  Surgical clips in the right axilla.  IMPRESSION: 1. Bulky mediastinal adenopathy. Bilateral pulmonary nodules and right middle lobe mass, better seen on 10/26/2020. 2. Small bilateral pleural effusions with mild bibasilar volume loss.  She most recently had a CT scan completed on 10/26/2020 which returned showing:  FINDINGS: Cardiovascular: The heart size is normal.  No substantial pericardial effusion.  Atherosclerotic calcification is noted in the wall of the thoracic aorta.  Coronary artery calcification is evident.  Mediastinum/Nodes: Bulky mediastinal lymphadenopathy evident. Heterogeneously enhancing nodal conglomeration anterior mediastinum measures 5.6 x 3.8 cm and extends up into the left thoracic inlet where it attenuates but does not obstruct the left innominate vein. 3.9 x 2.2 cm nodal conglomeration identified in the right paratracheal station on 43/2.  Subcarinal heterogeneously enhancing mass lesion measures 3.9 x 2.6 cm.  10 mm short axis right hilar lymph node evident.  The esophagus has normal imaging features. 16 mm short axis lymph node identified left axilla.  No right axillary lymphadenopathy.  15 mm short axis right supraclavicular node on image 1/series 2 has been incompletely visualized.  Lungs/Pleura: Numerous scattered tiny bilateral pulmonary nodules identified measuring from about 3 mm up to approximately 6-7 mm. Several of the more dominant nodules are seen along fissures of the right lung.  2.0 x 1.2 cm pleural base nodule noted right middle lobe on 67/5 with another adjacent/confluent peripheral right middle lobe mass on 81/5 measuring 4.1 x 1.9 cm.  Small right and tiny left pleural effusions  evident.  There is basilar atelectasis in the lower lobes bilaterally.  Upper Abdomen: 16 mm rim enhancing lesion posterior right liver on 105/2.  6 mm soft tissue nodule identified right paramidline preperitoneal space on 01/30 6/2.  Possible 7 mm mesenteric nodule posterior to the stomach on 140/2.  7 mm nodule seen adjacent to the left liver on 01/20 6/2.  13 mm hypoenhancing lesion identified in the tail of pancreas on 130/2.  Musculoskeletal: No worrisome lytic or sclerotic osseous abnormality.  IMPRESSION: 1. Bulky mediastinal, thoracic inlet, right supraclavicular, and left axillary lymphadenopathy consistent with metastatic disease. Associated numerous bilateral pulmonary nodules and masses, most prominently in the right middle lobe with dominant 4.1 cm peripheral mass lesion.  2. 16 mm rim enhancing lesion posterior right liver consistent with metastatic involvement.  3. 13 mm hypoenhancing lesion in the tail of pancreas. Primary neoplasm or metastatic involvement could have this appearance.  4. Multiple nodules in the upper abdomen concerning for peritoneal/mesenteric metastatic disease.  5. Small right and tiny left pleural effusions with bibasilar atelectasis.  6. Aortic Atherosclerosis (ICD10-I70.0).   Mrs. Portilla's oncologic history is as follows:   SUMMARY OF ONCOLOGIC HISTORY:     Oncology History Overview Note     Invasive ductal carcinoma of right breast in female   Staging form: Breast, AJCC 7th  Edition     Clinical: Stage IIA (T2, N0, M0) - Signed by [name], MD on 02/08/2015    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 ([facility])   08/28/1995 Cancer Diagnosis    Prior history of Stage I (T1N0M0) right breast invasive ductal carcinoma, S/P lumpectomy on 08/28/1995 with axilla lymph node dissection, 6 months of adjuvant chemotherapy with CMF([name]) and adjuvant radiation ([name]).    12/13/2014 Mammogram    Right breast:  possible asymmetry warranting further evaluation with spot compression views and possibly ultrasound    12/16/2014 Breast US    Right breast: no definitive abnormality within the superior aspect of the right breast to correspond with the mammographic finding.    01/10/2015 Breast MRI    Right breast: irregular rim enhancing mass within the superior right breast at 11:30 measuring 2.5 x 2.4 x 2.4 cm. No abnormal enhancement is identified within the skin of the superior right breast in the area of concern on mammogram.    01/12/2015 Breast US    Second look diagnostic mammogram and ultrasound showed a 2.3 cm mass in the upper midline right breast. No axillary adenopathy.    01/12/2015 Initial Biopsy    Right breast needle core bx (upper midline): Invasive ductal carcinoma, grade 2, ER+ (100%), PR+ (77%), HER2/neu negative (ratio 1.15), Ki67 20%     01/30/2015 Procedure    Breast High/Moderate Risk panel ([facility]) reveals no clinically significant variant at ATM, BRCA1, BRCA2, CDH1, CHEK2, PALB2, PTEN, STK11, and TP53.    02/08/2015 Clinical Stage    Stage IIA (T2 N0)    03/23/2015 Definitive Surgery    Right mastectomy ([name]): invasive adenocarcinoma, grade 3, 2.9 cm, HER2/neu repeated, negative (ratio 1.23)    03/23/2015 Oncotype testing    Score: 8 (6% ROR). No chemotherapy [name]).    03/23/2015 Pathologic Stage    Stage IIA: pT2 pNx    05/04/2015 -  Anti-estrogen oral therapy    Letrozole 2.76m daily. Planned duration of therapy at least 5 years.     07/25/2015 Survivorship    Survivorship visit completed and copy of care plan provided to patient.    12/15/2015 Mammogram    IMPRESSION: No mammographic evidence of malignancy. A result letter of this screening mammogram will be mailed directly to the patient.    12/23/2016 Mammogram    IMPRESSION: No mammographic evidence of malignancy. A result letter of this screening mammogram will be mailed directly to the  patient.     12/30/2017 Mammogram    12/30/2017 Mammogram IMPRESSION: No mammographic evidence of malignancy. A result letter of this screening mammogram will be mailed directly to the patient.    03/27/2020 Mammogram    IMPRESSION: No mammographic evidence of malignancy. A result letter of this screening mammogram will be mailed directly to the patient.    10/26/2020 Imaging    CT chest IMPRESSION: 1. Bulky mediastinal, thoracic inlet, right supraclavicular, and left axillary lymphadenopathy consistent with metastatic disease. Associated numerous bilateral pulmonary nodules and masses, most prominently in the right middle lobe with dominant 4.1 cm peripheral mass lesion. 2. 16 mm rim enhancing lesion posterior right liver consistent with metastatic involvement. 3. 13 mm hypoenhancing lesion in the tail of pancreas. Primary neoplasm or metastatic involvement could have this appearance. 4. Multiple nodules in the upper abdomen concerning for peritoneal/mesenteric metastatic disease. 5. Small right and tiny left pleural effusions with bibasilar atelectasis. 6. Aortic Atherosclerosis (ICD10-I70.0).        Medications:  I have reviewed the patient's current medications.  Allergies:  Allergies  Allergen Reactions  . Fosamax [Alendronate Sodium] Other (See Comments)    Aching all over    Past Medical History:  Diagnosis Date  . Anxiety   . Asthma    does not take any medications, hx of inhaler use  . Breast cancer [facility]) 1995/2016   right sided breast cancer in 08/1994  . Bronchitis    hx of  . GERD (gastroesophageal reflux disease)   . History of hiatal hernia   . Hypertension   . Hyperthyroidism     Past Surgical History:  Procedure Laterality Date  . BREAST BIOPSY Right 2016   malignant  . BREAST EXCISIONAL BIOPSY Left 1996   benign  . Carpel Tunnel  Bilateral   . INCONTINENCE SURGERY     with mesh insertion about 7 years ago  . LATISSIMUS FLAP TO  BREAST Right 03/23/2015   Procedure: LATISSIMUS FLAP TO BREAST WITH PLACEMENT OF IMPLANT FOR BREAST RECONSTRUCTION;  Surgeon: [name], MD;  Location: [name];  Service: Plastics;  Laterality: Right;  . MASTECTOMY Right 03/23/2015  . RECONSTRUCTION BREAST W/ LATISSIMUS DORSI FLAP Right 03/23/2015  . TONSILLECTOMY     as a child  . TOTAL KNEE ARTHROPLASTY Right 03/13/2018   Procedure: RIGHT TOTAL KNEE ARTHROPLASTY;  Surgeon: Sydnee Cabal, MD;  Location[facility];  Service: Orthopedics;  Laterality: Right;  Adductor Block  . TOTAL MASTECTOMY Right 03/23/2015   Procedure: RIGHT TOTAL MASTECTOMY;  Surgeon: [name], MD;  Location: [facility];  Service: General;  Laterality: Right;    Family History  Problem Relation Age of Onset  . COPD Father   . Heart disease Father     Social History   Socioeconomic History  . Marital status: Married    Spouse name: Not on file  . Number of children: 2  . Years of education: Not on file  . Highest education level: Not on file  Occupational History  . Not on file  Tobacco Use  . Smoking status: Never Smoker  . Smokeless tobacco: Never Used  Substance and Sexual Activity  . Alcohol use: Yes    Comment: occasional  . Drug use: No  . Sexual activity: Not on file  Other Topics Concern  . Not on file  Social History Narrative  . Not on file   Social Determinants of Health   Financial Resource Strain: Not on file  Food Insecurity: Not on file  Transportation Needs: Not on file  Physical Activity: Not on file  Stress: Not on file  Social Connections: Not on file  Intimate Partner Violence: Not on file    Past Medical History, Surgical history, Social history, and Family history were reviewed and updated as appropriate.   Please see review of systems for further details on the patient's review from today.   Review of Systems:  Review of Systems  Constitutional: Positive for appetite change. Negative for chills, diaphoresis and fever.   HENT: Positive for trouble swallowing.   Respiratory: Positive for shortness of breath. Negative for cough, choking, chest tightness, wheezing and stridor.   Cardiovascular: Positive for chest pain. Negative for palpitations.  Gastrointestinal: Negative for abdominal pain, constipation, diarrhea, nausea and vomiting.  Musculoskeletal: Positive for back pain and gait problem (The patient is using a wheelchair for ambulation.).  Neurological: Negative for dizziness and headaches.  Hematological: Positive for adenopathy.    Objective:   Physical Exam:  BP 133/78 (BP Location:  Left Arm, Patient Position: Sitting)   Pulse 97   Temp 97.6 F (36.4 C) (Tympanic)   Resp 18   Ht '4\' 11"'  (1.499 m)   Wt 139 lb 11.2 oz (63.4 kg)   SpO2 97%   BMI 28.22 kg/m  ECOG: 1  Physical Exam Constitutional:      General: She is not in acute distress.    Appearance: She is not diaphoretic.  HENT:     Head: Normocephalic and atraumatic.  Eyes:     General: No scleral icterus.       Right eye: No discharge.        Left eye: No discharge.     Conjunctiva/sclera: Conjunctivae normal.  Cardiovascular:     Rate and Rhythm: Normal rate and regular rhythm.     Heart sounds: Normal heart sounds. No murmur heard. No friction rub. No gallop.   Pulmonary:     Effort: Pulmonary effort is normal. No respiratory distress.     Breath sounds: Normal breath sounds. No stridor. No wheezing or rales.  Chest:  Breasts:     Right: No axillary adenopathy.     Left: Axillary adenopathy present.    Musculoskeletal:     Right lower leg: Edema present.     Left lower leg: Edema present.     Comments: Trace bilateral lower extremity edema.  Lymphadenopathy:     Cervical: Cervical adenopathy present.     Right cervical: Superficial cervical adenopathy present.     Left cervical: No superficial cervical adenopathy.     Upper Body:     Right upper body: No axillary adenopathy.     Left upper body: Axillary  adenopathy present.     Comments: Massive hard right supraclavicular lymphadenopathy.  Skin:    General: Skin is warm and dry.     Coloration: Skin is not pale.     Findings: No erythema.  Neurological:     Mental Status: She is alert.     Coordination: Coordination normal.     Gait: Gait abnormal.  Psychiatric:        Mood and Affect: Mood and affect normal.        Behavior: Behavior normal.        Thought Content: Thought content normal.        Judgment: Judgment normal.     Lab Review:     Component Value Date/Time   NA 137 11/08/2020 1342   NA 142 04/17/2017 1242   K 4.2 11/08/2020 1342   K 4.2 04/17/2017 1242   CL 104 11/08/2020 1342   CO2 24 11/08/2020 1342   CO2 24 04/17/2017 1242   GLUCOSE 105 (H) 11/08/2020 1342   GLUCOSE 124 04/17/2017 1242   BUN 13 11/08/2020 1342   BUN 15.2 04/17/2017 1242   CREATININE 1.01 (H) 11/08/2020 1342   CREATININE 1.23 (H) 10/25/2020 1450   CREATININE 1.23 (H) 10/25/2020 1450   CREATININE 1.1 04/17/2017 1242   CALCIUM 8.4 (L) 11/08/2020 1342   CALCIUM 8.9 04/17/2017 1242   PROT 6.6 11/08/2020 1342   PROT 7.0 04/17/2017 1242   ALBUMIN 3.0 (L) 11/08/2020 1342   ALBUMIN 3.8 04/17/2017 1242   AST 42 (H) 11/08/2020 1342   AST 15 04/17/2017 1242   ALT 19 11/08/2020 1342   ALT 17 04/17/2017 1242   ALKPHOS 170 (H) 11/08/2020 1342   ALKPHOS 59 04/17/2017 1242   BILITOT 0.4 11/08/2020 1342   BILITOT 0.46 04/17/2017 1242   GFRNONAA 59 (  L) 11/08/2020 1342   GFRNONAA 43 (L) 10/25/2020 1450   GFRAA 50 (L) 10/25/2020 1450       Component Value Date/Time   WBC 11.8 (H) 11/08/2020 1342   WBC 9.9 10/25/2020 1450   RBC 3.86 (L) 11/08/2020 1342   HGB 10.9 (L) 11/08/2020 1342   HGB 12.2 04/17/2017 1242   HCT 33.5 (L) 11/08/2020 1342   HCT 36.8 04/17/2017 1242   PLT 314 11/08/2020 1342   PLT 241 04/17/2017 1242   MCV 86.8 11/08/2020 1342   MCV 88.5 04/17/2017 1242   MCH 28.2 11/08/2020 1342   MCHC 32.5 11/08/2020 1342   RDW 13.9  11/08/2020 1342   RDW 14.2 04/17/2017 1242   LYMPHSABS 1.5 11/08/2020 1342   LYMPHSABS 1.7 04/17/2017 1242   MONOABS 1.0 11/08/2020 1342   MONOABS 0.4 04/17/2017 1242   EOSABS 0.1 11/08/2020 1342   EOSABS 0.1 04/17/2017 1242   BASOSABS 0.1 11/08/2020 1342   BASOSABS 0.0 04/17/2017 1242   -------------------------------  Imaging from last 24 hours (if applicable):  Radiology interpretation: DG Chest 2 View  Result Date: 11/08/2020 CLINICAL DATA:  Increasing shortness of breath, chest pain. EXAM: CHEST - 2 VIEW COMPARISON:  CT chest 10/26/2020 and chest radiograph 10/24/2020. FINDINGS: Trachea is midline. Heart size stable. Bulky mediastinal adenopathy. Small bilateral pleural effusions with associated bibasilar volume loss. Bilateral pulmonary nodules and right middle lobe mass, better seen on 10/26/2020 CT. Surgical clips in the right axilla. IMPRESSION: 1. Bulky mediastinal adenopathy. Bilateral pulmonary nodules and right middle lobe mass, better seen on 10/26/2020. 2. Small bilateral pleural effusions with mild bibasilar volume loss. Electronically Signed   By: Lorin Picket M.D.   On: 11/08/2020 13:38   DG Chest 2 View  Result Date: 10/25/2020 CLINICAL DATA:  Right lower lobe pneumonia, chest pain and cough since Friday EXAM: CHEST - 2 VIEW COMPARISON:  Radiograph 03/14/2009, CT 09/26/2006 FINDINGS: Confluent opacities present in the right middle lobe with an almost masslike appearance on the lateral radiograph though may be accentuated by overlying breast prostheses. However, there is additional conspicuous upper mediastinal widening with thickening along the right paratracheal stripe and a convex appearance of the left mediastinal border extending into the AP window as well as conspicuous perihilar fullness on the lateral view which raises concern for adenopathy. Some coarsened interstitial and bronchitic changes are present more diffusely. Blunting of the costophrenic sulci may be  related to some hyperinflation flattening of the diaphragms versus trace effusions. No pneumothorax. No acute osseous or soft tissue abnormality. Degenerative changes are present in the imaged spine and shoulders. Surgical clips in the right axilla. IMPRESSION: 1. Confluent opacities in the right middle lobe with an almost masslike appearance on the lateral radiograph though may be accentuated by overlying breast prostheses. While infection is not excluded, the presence of additional conspicuous mediastinal and perihilar contours is worrisome for malignancy and metastatic disease. Recommend further evaluation with contrast enhanced chest CT. These results will be called to the ordering clinician or representative by the Radiologist Assistant, and communication documented in the PACS or [employer]. Electronically Signed   By: Lovena Le M.D.   On: 10/25/2020 00:15   CT Chest W Contrast  Result Date: 10/26/2020 CLINICAL DATA:  Mediastinal lymphadenopathy. EXAM: CT CHEST WITH CONTRAST TECHNIQUE: Multidetector CT imaging of the chest was performed during intravenous contrast administration. CONTRAST:  35m ISOVUE-300 IOPAMIDOL (ISOVUE-300) INJECTION 61% COMPARISON:  Chest x-ray 10/24/2020 FINDINGS: Cardiovascular: The heart size is normal. No substantial pericardial  effusion. Atherosclerotic calcification is noted in the wall of the thoracic aorta. Coronary artery calcification is evident. Mediastinum/Nodes: Bulky mediastinal lymphadenopathy evident. Heterogeneously enhancing nodal conglomeration anterior mediastinum measures 5.6 x 3.8 cm and extends up into the left thoracic inlet where it attenuates but does not obstruct the left innominate vein. 3.9 x 2.2 cm nodal conglomeration identified in the right paratracheal station on 43/2. Subcarinal heterogeneously enhancing mass lesion measures 3.9 x 2.6 cm. 10 mm short axis right hilar lymph node evident. The esophagus has normal imaging features. 16 mm short  axis lymph node identified left axilla. No right axillary lymphadenopathy. 15 mm short axis right supraclavicular node on image 1/series 2 has been incompletely visualized. Lungs/Pleura: Numerous scattered tiny bilateral pulmonary nodules identified measuring from about 3 mm up to approximately 6-7 mm. Several of the more dominant nodules are seen along fissures of the right lung. 2.0 x 1.2 cm pleural base nodule noted right middle lobe on 67/5 with another adjacent/confluent peripheral right middle lobe mass on 81/5 measuring 4.1 x 1.9 cm. Small right and tiny left pleural effusions evident. There is basilar atelectasis in the lower lobes bilaterally. Upper Abdomen: 16 mm rim enhancing lesion posterior right liver on 105/2. 6 mm soft tissue nodule identified right paramidline preperitoneal space on 01/30 6/2. Possible 7 mm mesenteric nodule posterior to the stomach on 140/2. 7 mm nodule seen adjacent to the left liver on 01/20 6/2. 13 mm hypoenhancing lesion identified in the tail of pancreas on 130/2. Musculoskeletal: No worrisome lytic or sclerotic osseous abnormality. IMPRESSION: 1. Bulky mediastinal, thoracic inlet, right supraclavicular, and left axillary lymphadenopathy consistent with metastatic disease. Associated numerous bilateral pulmonary nodules and masses, most prominently in the right middle lobe with dominant 4.1 cm peripheral mass lesion. 2. 16 mm rim enhancing lesion posterior right liver consistent with metastatic involvement. 3. 13 mm hypoenhancing lesion in the tail of pancreas. Primary neoplasm or metastatic involvement could have this appearance. 4. Multiple nodules in the upper abdomen concerning for peritoneal/mesenteric metastatic disease. 5. Small right and tiny left pleural effusions with bibasilar atelectasis. 6. Aortic Atherosclerosis (ICD10-I70.0). Electronically Signed   By: Misty Stanley M.D.   On: 10/26/2020 12:54        This patient was seen with Dr. [name] with my treatment  plan reviewed with her. She expressed agreement with my medical management of this patient.  Addendum  I have seen the patient, examined her. I agree with the assessment and and plan and have edited the notes.   Pt came in today for worsening dyspnea, and she was hypoxic when she was walking. Exam showed enlarging right McPherson node. Her dyspnea and hypoxia is likely related to diffuse lung mets and mediastinal bulky adenopathy. No high suspicion for PE at this point. Unfortunately her biopsy has not been scheduled, and I made an urgent referral to IR today and spoke with Dr. Annamaria Boots. Will set up home oxygen. Will review with rad/onc about RT to mediastinal adenopathy.  [name]  11/08/2020

## 2020-11-08 NOTE — Telephone Encounter (Signed)
Tara Rodgers [phone]  [name] called concerned about her mom, she is not on mom's DPR but she wanted to let you know what is going on, she said Nachelle was supposed to have called yesterday but she sleep all day.  A couple of days ago Brilyn passed out in the kitchen, she woke up and found herself in the floor, does not know how long she was there or how she got there. Heart rate has been going up to 142, oxygen has been dropping to 92, she can not walk 20 feet without being short of breath, yesterday she was very disoriented, she sleep 11-3, then went back to sleep and woke up at 6 and thought it was 6 in the morning and that her husband had gone to work, she is still coughing and very tired all the time.

## 2020-11-08 NOTE — Telephone Encounter (Signed)
Called [name] back to let her know that Dr Renold Genta advise for Tara Rodgers to go to Hospital to be evaluated and she stated patient will not do that. I also let her know she needs to call her oncologist and let them know what is going on because she is also under their care with every thing that her mom has going on. She verbalized understanding.

## 2020-11-09 ENCOUNTER — Ambulatory Visit
Admission: RE | Admit: 2020-11-09 | Discharge: 2020-11-09 | Disposition: A | Discharge status: Home / Self Care | Payer: Medicare Other | Source: Ambulatory Visit | Attending: Radiation Oncology | Admitting: Radiation Oncology

## 2020-11-09 ENCOUNTER — Other Ambulatory Visit: Payer: Medicare Other

## 2020-11-09 ENCOUNTER — Other Ambulatory Visit: Payer: Self-pay | Admitting: Radiology

## 2020-11-09 ENCOUNTER — Encounter (HOSPITAL_COMMUNITY): Payer: Self-pay

## 2020-11-09 ENCOUNTER — Other Ambulatory Visit: Payer: Self-pay

## 2020-11-09 ENCOUNTER — Encounter: Payer: Self-pay | Admitting: Radiation Oncology

## 2020-11-09 VITALS — BP 117/65 | HR 106 | Temp 96.7°F | Resp 18 | Ht 59.0 in | Wt 138.0 lb

## 2020-11-09 DIAGNOSIS — F419 Anxiety disorder, unspecified: Secondary | ICD-10-CM | POA: Diagnosis not present

## 2020-11-09 DIAGNOSIS — C50911 Malignant neoplasm of unspecified site of right female breast: Secondary | ICD-10-CM | POA: Insufficient documentation

## 2020-11-09 DIAGNOSIS — Z9011 Acquired absence of right breast and nipple: Secondary | ICD-10-CM | POA: Insufficient documentation

## 2020-11-09 DIAGNOSIS — C801 Malignant (primary) neoplasm, unspecified: Secondary | ICD-10-CM | POA: Diagnosis not present

## 2020-11-09 DIAGNOSIS — Z79899 Other long term (current) drug therapy: Secondary | ICD-10-CM | POA: Insufficient documentation

## 2020-11-09 DIAGNOSIS — R109 Unspecified abdominal pain: Secondary | ICD-10-CM | POA: Diagnosis not present

## 2020-11-09 DIAGNOSIS — K449 Diaphragmatic hernia without obstruction or gangrene: Secondary | ICD-10-CM | POA: Diagnosis not present

## 2020-11-09 DIAGNOSIS — C771 Secondary and unspecified malignant neoplasm of intrathoracic lymph nodes: Secondary | ICD-10-CM

## 2020-11-09 DIAGNOSIS — C7800 Secondary malignant neoplasm of unspecified lung: Secondary | ICD-10-CM

## 2020-11-09 DIAGNOSIS — Z79811 Long term (current) use of aromatase inhibitors: Secondary | ICD-10-CM | POA: Insufficient documentation

## 2020-11-09 DIAGNOSIS — J90 Pleural effusion, not elsewhere classified: Secondary | ICD-10-CM | POA: Insufficient documentation

## 2020-11-09 DIAGNOSIS — C781 Secondary malignant neoplasm of mediastinum: Secondary | ICD-10-CM | POA: Insufficient documentation

## 2020-11-09 DIAGNOSIS — Z170 Estrogen receptor positive status [ER+]: Secondary | ICD-10-CM | POA: Diagnosis not present

## 2020-11-09 DIAGNOSIS — J45909 Unspecified asthma, uncomplicated: Secondary | ICD-10-CM | POA: Insufficient documentation

## 2020-11-09 DIAGNOSIS — R0902 Hypoxemia: Secondary | ICD-10-CM | POA: Insufficient documentation

## 2020-11-09 DIAGNOSIS — K219 Gastro-esophageal reflux disease without esophagitis: Secondary | ICD-10-CM | POA: Diagnosis not present

## 2020-11-09 NOTE — Progress Notes (Signed)
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   HISTORY OF PRESENT ILLNESS: Tara Rodgers is a 75 y.o. female seen at the request of Dr. [name] for a history of recurrent right breast cancer originally diagnosed in 1996, her history is described as being stage IA T1N0 disease, and she underwent lumpectomy in November 1996 with axillary lymph node dissection and received adjuvant radiation with [name] and adjuvant chemotherapy with Dr. Pearlie Oyster.  Unfortunately she was found to have a recurrence in the right breast in March 2016.  Biopsies during her work-up showed a grade 2 ER/PR positive, HER-2 negative invasive ductal carcinoma with a Ki-67 of 20%, she underwent right mastectomy for this which measured 2.9 cm, her grade was 3, and her 2 was repeated and was negative, no additional lymph nodes were taken because of her prior axillary dissection, her Oncotype score was 8 and no additional chemotherapy was recommended.  She remains on letrozole and was felt to have no additional disease.  Within the last few weeks however she has developed increasing dysphagia and shortness of breath.  She had a chest x-ray to rule out pneumonia at her PCP office which actually showed confluent opacities in the right middle lobe with masslike appearance of the lateral aspect of the radiograph.  She underwent CT of the chest with contrast which unfortunately shows bulky mediastinal, thoracic inlet, right supraclavicular, and left axillary  adenopathy concerning for metastatic disease with numerous bilateral pulmonary nodules and masses most predominantly in the right middle lobe measuring up to 4.1 cm.  16m rim-enhancing lesion in the right liver was concerning for metastatic disease as well as a 13 mm hypoenhancing lesion seen in the tail of the pancreas.  Primary neoplasm or metastatic involvement could have this appearance and she also had peritoneal and mesenteric adenopathy.  Small right and left pleural effusions were also noted.  She is planning to undergo a PET scan tomorrow and ultrasound-guided biopsy of cervical adenopathy on Monday.  When she was seen in the clinic, it was felt that she had progressive symptoms and ultimately was also seen yesterday in symptom management clinic, she was felt to have a need to start supplemental oxygen and is waiting to get this set up at home.  She is seen today to discuss options of palliative radiotherapy.   PREVIOUS RADIATION THERAPY: Yes   1997:  The right breast was treated with adjuvant radiotherapy over about 6 1/2 weeks, we do not have details that far back regarding her treatment.    PAST MEDICAL HISTORY:  Past Medical History:  Diagnosis Date  . Anxiety   . Asthma    does not take any medications, hx of inhaler use  . Breast cancer ([facility] 1995/2016   right sided breast cancer in 08/1994  . Bronchitis    hx of  . GERD (gastroesophageal reflux disease)   . History of hiatal hernia   . Hypertension   . Hyperthyroidism   . Lung cancer ([facility] 11/02/2020  PAST SURGICAL HISTORY: Past Surgical History:  Procedure Laterality Date  . BREAST BIOPSY Right 2016   malignant  . BREAST EXCISIONAL BIOPSY Left 1996   benign  . Carpel Tunnel  Bilateral   . INCONTINENCE SURGERY     with mesh insertion about 7 years ago  . LATISSIMUS FLAP TO BREAST Right 03/23/2015   Procedure: LATISSIMUS FLAP TO BREAST WITH PLACEMENT OF IMPLANT FOR BREAST RECONSTRUCTION;  Surgeon: [name], MD;  Location: [place];  Service: Plastics;  Laterality: Right;  . MASTECTOMY Right 03/23/2015  . RECONSTRUCTION BREAST W/ LATISSIMUS DORSI FLAP Right 03/23/2015  . TONSILLECTOMY     as a child  . TOTAL KNEE ARTHROPLASTY Right 03/13/2018   Procedure: RIGHT TOTAL KNEE ARTHROPLASTY;  Surgeon: Sydnee Cabal, MD;  Location: [facility];  Service: Orthopedics;  Laterality: Right;  Adductor Block  . TOTAL MASTECTOMY Right 03/23/2015   Procedure: RIGHT TOTAL MASTECTOMY;  Surgeon: [name], MD;  Location: [place];  Service: General;  Laterality: Right;     FAMILY HISTORY:  Family History  Problem Relation Age of Onset  . COPD Father   . Heart disease Father      SOCIAL HISTORY:  reports that she has never smoked. She has never used smokeless tobacco. She reports current alcohol use. She reports that she does not use drugs. The patient is married and lives in [place]. She is accompanied by her husband.    ALLERGIES: Fosamax [alendronate sodium]   MEDICATIONS:  Current Outpatient Medications  Medication Sig Dispense Refill  . ALPRAZolam (XANAX) 0.5 MG tablet Take 1 tablet by mouth twice daily as needed 180 tablet 0  . cyclobenzaprine (FLEXERIL) 10 MG tablet Take 1 tablet (10 mg total) by mouth 3 (three) times daily as needed for muscle spasms. 30 tablet 2  . doxycycline (VIBRA-TABS) 100 MG tablet Take 1 tablet (100 mg total) by mouth 2 (two) times daily. 20 tablet 0  . esomeprazole (NEXIUM) 40 MG capsule One po daily at bedtime 90 capsule 1  . EUTHYROX 75 MCG tablet Take 1 tablet by mouth daily 90 tablet 0  . HYDROcodone-acetaminophen (NORCO) 5-325 MG tablet 1/2 to 1 tablet PO Q 6 hours prn pain 30 tablet 0  . HYDROcodone-homatropine (HYCODAN) 5-1.5 MG/5ML syrup Take 5 mLs by mouth every 8 (eight) hours as needed for cough. 120 mL 0  . letrozole (FEMARA) 2.5 MG tablet Take 1 tablet (2.5 mg total) by mouth daily. 90 tablet 3  . metoprolol succinate (TOPROL-XL) 50 MG 24 hr tablet  Take 1 tablet (50 mg total) by mouth 2 (two) times daily. 180 tablet 3  . simvastatin (ZOCOR) 40 MG tablet TAKE 1 TABLET BY MOUTH AT BEDTIME 90 tablet 0  . verapamil (CALAN-SR) 240 MG CR tablet Take 1 tablet by mouth once daily 90 tablet 0   No current facility-administered medications for this encounter.     REVIEW OF SYSTEMS: On review of systems, the patient reports that unless she is up moving around she is when she tries to sleep which is why she has been sleeping in a recliner or propping herself up with pillows.  She reports dysphagia but has not had any weight loss as a result, she has actually gained 6 pounds in the last month or 2.  She also describes epigastric pain with her symptoms being a deep gnawing and aching pain.  She denies fevers or chills chest pain or productive cough.  No other complaints are verbalized.  PHYSICAL EXAM:  Wt Readings from Last 3 Encounters:  11/09/20 138 lb (62.6 kg)  11/08/20 139 lb 11.2 oz (63.4 kg)  10/27/20 138 lb 8 oz (62.8 kg)   Temp Readings from Last 3 Encounters:  11/09/20 (!) 96.7 F (35.9 C) (Temporal)  11/08/20 97.6 F (36.4 C) (Tympanic)  10/27/20 98.1 F (36.7 C) (Tympanic)   BP Readings from Last 3 Encounters:  11/09/20 117/65  11/08/20 133/78  10/27/20 127/68   Pulse Readings from Last 3 Encounters:  11/09/20 (!) 106  11/08/20 97  10/27/20 (!) 102   Pain Assessment Pain Score: 5  Pain Loc: Chest (Takes pain medication)/10 In general this is a well appearing Caucasian female in no acute distress.  She's alert and oriented x4 and appropriate throughout the examination. Cardiopulmonary assessment is negative for acute distress and she exhibits normal effort.    ECOG = 1  0 - Asymptomatic (Fully active, able to carry on all predisease activities without restriction)  1 - Symptomatic but completely ambulatory (Restricted in physically strenuous activity but ambulatory and able to carry out work of a light or  sedentary nature. For example, light housework, office work)  2 - Symptomatic, <50% in bed during the day (Ambulatory and capable of all self care but unable to carry out any work activities. Up and about more than 50% of waking hours)  3 - Symptomatic, >50% in bed, but not bedbound (Capable of only limited self-care, confined to bed or chair 50% or more of waking hours)  4 - Bedbound (Completely disabled. Cannot carry on any self-care. Totally confined to bed or chair)  5 - Death   Eustace Pen MM, Creech RH, Tormey DC, et al. [phone]). "Toxicity and response criteria of the [facility]". [place] Oncol. 5 (6): 649-55    LABORATORY DATA:  Lab Results  Component Value Date   WBC 11.8 (H) 11/08/2020   HGB 10.9 (L) 11/08/2020   HCT 33.5 (L) 11/08/2020   MCV 86.8 11/08/2020   PLT 314 11/08/2020   Lab Results  Component Value Date   NA 137 11/08/2020   K 4.2 11/08/2020   CL 104 11/08/2020   CO2 24 11/08/2020   Lab Results  Component Value Date   ALT 19 11/08/2020   AST 42 (H) 11/08/2020   ALKPHOS 170 (H) 11/08/2020   BILITOT 0.4 11/08/2020      RADIOGRAPHY: DG Chest 2 View  Result Date: 11/08/2020 CLINICAL DATA:  Increasing shortness of breath, chest pain. EXAM: CHEST - 2 VIEW COMPARISON:  CT chest 10/26/2020 and chest radiograph 10/24/2020. FINDINGS: Trachea is midline. Heart size stable. Bulky mediastinal adenopathy. Small bilateral pleural effusions with associated bibasilar volume loss. Bilateral pulmonary nodules and right middle lobe mass, better seen on 10/26/2020 CT. Surgical clips in the right axilla. IMPRESSION: 1. Bulky mediastinal adenopathy. Bilateral pulmonary nodules and right middle lobe mass, better seen on 10/26/2020. 2. Small bilateral pleural effusions with mild bibasilar volume loss. Electronically Signed   By: Lorin Picket M.D.   On: 11/08/2020 13:38   DG Chest 2 View  Result Date: 10/25/2020 CLINICAL DATA:  Right lower lobe  pneumonia, chest pain and cough since Friday EXAM: CHEST - 2 VIEW COMPARISON:  Radiograph 03/14/2009, CT 09/26/2006 FINDINGS: Confluent opacities present in the right middle lobe with an almost masslike appearance on the lateral radiograph though may be accentuated by overlying breast prostheses. However, there is additional conspicuous upper mediastinal widening with thickening along the right paratracheal stripe  and a convex appearance of the left mediastinal border extending into the AP window as well as conspicuous perihilar fullness on the lateral view which raises concern for adenopathy. Some coarsened interstitial and bronchitic changes are present more diffusely. Blunting of the costophrenic sulci may be related to some hyperinflation flattening of the diaphragms versus trace effusions. No pneumothorax. No acute osseous or soft tissue abnormality. Degenerative changes are present in the imaged spine and shoulders. Surgical clips in the right axilla. IMPRESSION: 1. Confluent opacities in the right middle lobe with an almost masslike appearance on the lateral radiograph though may be accentuated by overlying breast prostheses. While infection is not excluded, the presence of additional conspicuous mediastinal and perihilar contours is worrisome for malignancy and metastatic disease. Recommend further evaluation with contrast enhanced chest CT. These results will be called to the ordering clinician or representative by the Radiologist Assistant, and communication documented in the PACS or [employer]. Electronically Signed   By: Lovena Le M.D.   On: 10/25/2020 00:15   CT Chest W Contrast  Result Date: 10/26/2020 CLINICAL DATA:  Mediastinal lymphadenopathy. EXAM: CT CHEST WITH CONTRAST TECHNIQUE: Multidetector CT imaging of the chest was performed during intravenous contrast administration. CONTRAST:  65m ISOVUE-300 IOPAMIDOL (ISOVUE-300) INJECTION 61% COMPARISON:  Chest x-ray 10/24/2020 FINDINGS:  Cardiovascular: The heart size is normal. No substantial pericardial effusion. Atherosclerotic calcification is noted in the wall of the thoracic aorta. Coronary artery calcification is evident. Mediastinum/Nodes: Bulky mediastinal lymphadenopathy evident. Heterogeneously enhancing nodal conglomeration anterior mediastinum measures 5.6 x 3.8 cm and extends up into the left thoracic inlet where it attenuates but does not obstruct the left innominate vein. 3.9 x 2.2 cm nodal conglomeration identified in the right paratracheal station on 43/2. Subcarinal heterogeneously enhancing mass lesion measures 3.9 x 2.6 cm. 10 mm short axis right hilar lymph node evident. The esophagus has normal imaging features. 16 mm short axis lymph node identified left axilla. No right axillary lymphadenopathy. 15 mm short axis right supraclavicular node on image 1/series 2 has been incompletely visualized. Lungs/Pleura: Numerous scattered tiny bilateral pulmonary nodules identified measuring from about 3 mm up to approximately 6-7 mm. Several of the more dominant nodules are seen along fissures of the right lung. 2.0 x 1.2 cm pleural base nodule noted right middle lobe on 67/5 with another adjacent/confluent peripheral right middle lobe mass on 81/5 measuring 4.1 x 1.9 cm. Small right and tiny left pleural effusions evident. There is basilar atelectasis in the lower lobes bilaterally. Upper Abdomen: 16 mm rim enhancing lesion posterior right liver on 105/2. 6 mm soft tissue nodule identified right paramidline preperitoneal space on 01/30 6/2. Possible 7 mm mesenteric nodule posterior to the stomach on 140/2. 7 mm nodule seen adjacent to the left liver on 01/20 6/2. 13 mm hypoenhancing lesion identified in the tail of pancreas on 130/2. Musculoskeletal: No worrisome lytic or sclerotic osseous abnormality. IMPRESSION: 1. Bulky mediastinal, thoracic inlet, right supraclavicular, and left axillary lymphadenopathy consistent with metastatic  disease. Associated numerous bilateral pulmonary nodules and masses, most prominently in the right middle lobe with dominant 4.1 cm peripheral mass lesion. 2. 16 mm rim enhancing lesion posterior right liver consistent with metastatic involvement. 3. 13 mm hypoenhancing lesion in the tail of pancreas. Primary neoplasm or metastatic involvement could have this appearance. 4. Multiple nodules in the upper abdomen concerning for peritoneal/mesenteric metastatic disease. 5. Small right and tiny left pleural effusions with bibasilar atelectasis. 6. Aortic Atherosclerosis (ICD10-I70.0). Electronically Signed   By: ERandall Hiss  Tery Sanfilippo M.D.   On: 10/26/2020 12:54       IMPRESSION/PLAN: 1. Recurrent metastatic right breast cancer versus metastatic disease of unknown primary. Dr. Lisbeth Renshaw discusses the patient's history and reviews her CT scan images.  He discusses the rationale to consider palliative radiotherapy to the chest and thoracic lymph nodes to try and improve her quality of life with breathing and improve her dysphagia.  Dr. [name] is still working the patient up with PET scan imaging as well as biopsy for further clarification of her disease process.  We discussed the risks, benefits, short, and long term effects of radiotherapy, as well as the palliative intent, and the patient is interested in proceeding. Dr. Lisbeth Renshaw discusses the delivery and logistics of radiotherapy and anticipates a course of 3 weeks of radiotherapy to the chest and thoracic lymph nodes. Written consent is obtained and placed in the chart, a copy was provided to the patient.  She will come tomorrow morning at 8 AM for simulation and begin treatment on Monday, 11/13/2020. 2. Abdominal pain.  The patient does have medication to use for this, we will continue to follow this expectantly but she is encouraged to call back if her symptoms progressively worsen in an acute manner. 3. Hypoxemia.  The patient does have exertional hypoxia, and is getting set  up for at home oxygen.  At rest however she saturates in the normal range on room air.  She is encouraged to bring her oxygen once a get set up with her during treatment.  In a visit lasting 60 minutes, greater than 50% of the time was spent face to face discussing the patient's condition, in preparation for the discussion, and coordinating the patient's care.   The above documentation reflects my direct findings during this shared patient visit. Please see the separate note by Dr. Lisbeth Renshaw on this date for the remainder of the patient's plan of care.    Carola Rhine, PAC

## 2020-11-09 NOTE — Progress Notes (Signed)
Thoracic Location of Tumor / Histology: Mediastinum  Patient presented with lower and right chest pain, mild cough and SOB with any activity.  PET 11/10/2020:  CT Chest 11/08/2020: Bulky mediastinal adenopathy.  Bilateral pulmonary nodules and right middle lobe mass, better seen on 10/26/2020.  Small bilateral pleural effusions with mild bibasilar volume loss.  CT Chest 10/26/2020: Bulky mediastinal, thoracic inlet, right supraclavicular, and left axillary lymphadenopathy consistent with metastatic disease. Associated numerous bilateral pulmonary nodules and masses, most prominently in the right middle lobe with dominant 4.1 cm peripheral mass lesion.  16 mm rim enhancing lesion posterior right liver consistent with metastatic involvement.  13 mm hypoenhancing lesion in the tail of pancreas. Primary neoplasm or metastatic involvement could have this appearance.  Multiple nodules in the upper abdomen concerning for peritoneal/mesenteric metastatic disease.  Biopsies of Lymph nodes 11/13/2020  Tobacco/Marijuana/Snuff/ETOH use: None  Past/Anticipated interventions by cardiothoracic surgery, if any:   Past/Anticipated interventions by medical oncology, if any:  Dr. [name] 10/27/2020 -I recommend PET scan for better evaluation of disease involvement, especially the lesion in pancreas to see if less likely malignant. She is agreeable.  -She has a large palpable right supraclavicular lymph nodes which I recommend biopsy, as it is palpable on exam and most easy to access. This will help determine diagnosis and the origin of primary. She is agreeable.    Signs/Symptoms  Weight changes, if any: No  Respiratory complaints, if any: Extremely SOB with activities.  Hemoptysis, if any: Non-productive cough.  Pain issues, if any:  Chest pains  SAFETY ISSUES:  Prior radiation? Right Breast 1997, [name] 33 fractions  Pacemaker/ICD? No  Possible current pregnancy? Postmenopausal  Is the patient on  methotrexate? No  Current Complaints / other details:

## 2020-11-09 NOTE — Progress Notes (Unsigned)
JF    1       Pojoaque Cashen Female, 75 y.o., 03-14-1946  MRN:  [id number] Phone:  [phone] Jerilynn Mages)       PCP:  Elby Showers, MD Coverage:  [facility]re P[facility] Medicare/[employer] Medicare  Next Appt With Radiology ([facility] PET) 11/10/2020 at 12:00 PM           RE: Biopsy Received: Today  Message Details  Greggory Keen, MD  Ernestene Mention for [place] rt supraclavicular node core bx   Ts    Previous Messages  ----- Message -----  From: Lenore Cordia  Sent: 11/09/2020  8:16 AM EST  To: [facility] Procedure Requests  Subject: Biopsy                      Procedure Requested: [place] Core Biopsy (Lymph Nodes)    Reason for Procedure: dyspnea    Provider Requesting: Dr. [name]  Provider Telephone: [phone]    Other Info: Would like it done STAT, Nurse stated Dr [name] spoke with Dr Annamaria Boots about this patient

## 2020-11-10 ENCOUNTER — Ambulatory Visit
Admission: RE | Admit: 2020-11-10 | Discharge: 2020-11-10 | Disposition: A | Discharge status: Home / Self Care | Payer: Medicare Other | Source: Ambulatory Visit | Attending: Radiation Oncology | Admitting: Radiation Oncology

## 2020-11-10 ENCOUNTER — Encounter (HOSPITAL_COMMUNITY)
Admission: RE | Admit: 2020-11-10 | Discharge: 2020-11-10 | Disposition: A | Discharge status: Home / Self Care | Payer: Medicare Other | Source: Ambulatory Visit | Attending: Hematology | Admitting: Hematology

## 2020-11-10 ENCOUNTER — Other Ambulatory Visit: Payer: Self-pay

## 2020-11-10 DIAGNOSIS — C771 Secondary and unspecified malignant neoplasm of intrathoracic lymph nodes: Secondary | ICD-10-CM | POA: Diagnosis not present

## 2020-11-10 DIAGNOSIS — J90 Pleural effusion, not elsewhere classified: Secondary | ICD-10-CM | POA: Diagnosis not present

## 2020-11-10 DIAGNOSIS — C50911 Malignant neoplasm of unspecified site of right female breast: Secondary | ICD-10-CM | POA: Diagnosis not present

## 2020-11-10 DIAGNOSIS — Z853 Personal history of malignant neoplasm of breast: Secondary | ICD-10-CM | POA: Diagnosis not present

## 2020-11-10 DIAGNOSIS — Z510 Encounter for antineoplastic radiation therapy: Secondary | ICD-10-CM | POA: Diagnosis not present

## 2020-11-10 DIAGNOSIS — C3491 Malignant neoplasm of unspecified part of right bronchus or lung: Secondary | ICD-10-CM | POA: Diagnosis not present

## 2020-11-10 DIAGNOSIS — R590 Localized enlarged lymph nodes: Secondary | ICD-10-CM | POA: Insufficient documentation

## 2020-11-10 DIAGNOSIS — C7951 Secondary malignant neoplasm of bone: Secondary | ICD-10-CM | POA: Insufficient documentation

## 2020-11-10 DIAGNOSIS — C787 Secondary malignant neoplasm of liver and intrahepatic bile duct: Secondary | ICD-10-CM | POA: Insufficient documentation

## 2020-11-10 DIAGNOSIS — C772 Secondary and unspecified malignant neoplasm of intra-abdominal lymph nodes: Secondary | ICD-10-CM | POA: Insufficient documentation

## 2020-11-10 DIAGNOSIS — C786 Secondary malignant neoplasm of retroperitoneum and peritoneum: Secondary | ICD-10-CM | POA: Diagnosis not present

## 2020-11-10 DIAGNOSIS — C781 Secondary malignant neoplasm of mediastinum: Secondary | ICD-10-CM | POA: Diagnosis not present

## 2020-11-10 LAB — GLUCOSE, CAPILLARY: Glucose-Capillary: 95 mg/dL (ref 70–99)

## 2020-11-10 MED ORDER — FLUDEOXYGLUCOSE F - 18 (FDG) INJECTION
6.8500 | Freq: Once | INTRAVENOUS | Status: AC
  Administered 2020-11-10: 6.85 via INTRAVENOUS

## 2020-11-13 ENCOUNTER — Ambulatory Visit (HOSPITAL_COMMUNITY)
Admission: RE | Admit: 2020-11-13 | Discharge: 2020-11-13 | Disposition: A | Discharge status: Home / Self Care | Payer: Medicare Other | Source: Ambulatory Visit | Attending: Hematology | Admitting: Hematology

## 2020-11-13 ENCOUNTER — Ambulatory Visit
Admission: RE | Admit: 2020-11-13 | Discharge: 2020-11-13 | Disposition: A | Discharge status: Home / Self Care | Payer: Medicare Other | Source: Ambulatory Visit | Attending: Radiation Oncology | Admitting: Radiation Oncology

## 2020-11-13 ENCOUNTER — Encounter (HOSPITAL_COMMUNITY): Payer: Self-pay

## 2020-11-13 ENCOUNTER — Other Ambulatory Visit: Payer: Self-pay

## 2020-11-13 ENCOUNTER — Ambulatory Visit (HOSPITAL_COMMUNITY)
Admission: RE | Admit: 2020-11-13 | Discharge: 2020-11-13 | Disposition: A | Discharge status: Home / Self Care | Payer: Medicare Other | Source: Ambulatory Visit | Attending: Interventional Radiology | Admitting: Interventional Radiology

## 2020-11-13 ENCOUNTER — Ambulatory Visit (HOSPITAL_COMMUNITY)
Admission: RE | Admit: 2020-11-13 | Discharge: 2020-11-13 | Disposition: A | Discharge status: Home / Self Care | Payer: Medicare Other | Source: Ambulatory Visit | Attending: Student | Admitting: Student

## 2020-11-13 DIAGNOSIS — C771 Secondary and unspecified malignant neoplasm of intrathoracic lymph nodes: Secondary | ICD-10-CM | POA: Diagnosis not present

## 2020-11-13 DIAGNOSIS — R918 Other nonspecific abnormal finding of lung field: Secondary | ICD-10-CM | POA: Diagnosis not present

## 2020-11-13 DIAGNOSIS — R221 Localized swelling, mass and lump, neck: Secondary | ICD-10-CM | POA: Diagnosis not present

## 2020-11-13 DIAGNOSIS — C786 Secondary malignant neoplasm of retroperitoneum and peritoneum: Secondary | ICD-10-CM | POA: Diagnosis not present

## 2020-11-13 DIAGNOSIS — C50911 Malignant neoplasm of unspecified site of right female breast: Secondary | ICD-10-CM

## 2020-11-13 DIAGNOSIS — R0600 Dyspnea, unspecified: Secondary | ICD-10-CM

## 2020-11-13 DIAGNOSIS — Z853 Personal history of malignant neoplasm of breast: Secondary | ICD-10-CM | POA: Insufficient documentation

## 2020-11-13 DIAGNOSIS — R222 Localized swelling, mass and lump, trunk: Secondary | ICD-10-CM | POA: Insufficient documentation

## 2020-11-13 DIAGNOSIS — Z510 Encounter for antineoplastic radiation therapy: Secondary | ICD-10-CM | POA: Diagnosis not present

## 2020-11-13 DIAGNOSIS — C3491 Malignant neoplasm of unspecified part of right bronchus or lung: Secondary | ICD-10-CM | POA: Diagnosis not present

## 2020-11-13 DIAGNOSIS — J90 Pleural effusion, not elsewhere classified: Secondary | ICD-10-CM | POA: Diagnosis not present

## 2020-11-13 DIAGNOSIS — C781 Secondary malignant neoplasm of mediastinum: Secondary | ICD-10-CM | POA: Diagnosis not present

## 2020-11-13 DIAGNOSIS — R590 Localized enlarged lymph nodes: Secondary | ICD-10-CM | POA: Diagnosis not present

## 2020-11-13 DIAGNOSIS — C787 Secondary malignant neoplasm of liver and intrahepatic bile duct: Secondary | ICD-10-CM | POA: Diagnosis not present

## 2020-11-13 DIAGNOSIS — J9811 Atelectasis: Secondary | ICD-10-CM | POA: Diagnosis not present

## 2020-11-13 LAB — CBC
HCT: 34.3 % — ABNORMAL LOW (ref 36.0–46.0)
Hemoglobin: 11.1 g/dL — ABNORMAL LOW (ref 12.0–15.0)
MCH: 28.5 pg (ref 26.0–34.0)
MCHC: 32.4 g/dL (ref 30.0–36.0)
MCV: 87.9 fL (ref 80.0–100.0)
Platelets: 377 10*3/uL (ref 150–400)
RBC: 3.9 MIL/uL (ref 3.87–5.11)
RDW: 13.6 % (ref 11.5–15.5)
WBC: 11.1 10*3/uL — ABNORMAL HIGH (ref 4.0–10.5)
nRBC: 0 % (ref 0.0–0.2)

## 2020-11-13 MED ORDER — SODIUM CHLORIDE 0.9 % IV SOLN: INTRAVENOUS | Status: DC

## 2020-11-13 MED ORDER — MIDAZOLAM HCL 2 MG/2ML IJ SOLN
INTRAMUSCULAR | Status: AC
  Filled 2020-11-13: qty 2

## 2020-11-13 MED ORDER — FENTANYL CITRATE (PF) 100 MCG/2ML IJ SOLN
INTRAMUSCULAR | Status: AC | PRN
  Administered 2020-11-13: 25 ug via INTRAVENOUS

## 2020-11-13 MED ORDER — FENTANYL CITRATE (PF) 100 MCG/2ML IJ SOLN
INTRAMUSCULAR | Status: AC
  Filled 2020-11-13: qty 2

## 2020-11-13 MED ORDER — MIDAZOLAM HCL 2 MG/2ML IJ SOLN
INTRAMUSCULAR | Status: AC | PRN
  Administered 2020-11-13: 0.5 mg via INTRAVENOUS

## 2020-11-13 MED ORDER — LIDOCAINE HCL (PF) 1 % IJ SOLN
INTRAMUSCULAR | Status: AC
  Filled 2020-11-13: qty 30

## 2020-11-13 NOTE — Discharge Instructions (Addendum)

## 2020-11-13 NOTE — Procedures (Signed)
Interventional Radiology Procedure Note  Procedure:  1) Ultrasound guided right supraclavicular mass biopsy 2) Right thoracentesis  Findings: Please refer to procedural dictation for full description. 18 ga core x3 of right supraclavicular mass, samples placed in formalin and sent to Pathology.  Small, R>L pleural effusions.  200 mL translucent, straw-colored fluid removed from right pleural space.  Complications: None immediate  Estimated Blood Loss: < 5 mL  Recommendations: -immediate post-thoracentesis CXR -observe for 1 hour after normal CXR, then discharge home -follow up Pathology results   Ruthann Cancer, MD

## 2020-11-13 NOTE — H&P (Signed)
Chief Complaint: Patient was seen in consultation today for right supraclavicular lymph node biopsy at the request of Feng,Yan  Referring Physician(s): Feng,Yan  Supervising Physician: Ruthann Cancer  Patient Status: [name]  History of Present Illness: Tara Rodgers is a 75 y.o. female    Hx [place] and 2016 Follows with Dr Ky Barban Hx Covid 1/21--- persistent breathing issues Presented with SOB; fatigue to ED 10/24/20 Continues to be very SOB even today On Home O2   Work up ensued: CT 10/26/20: IMPRESSION: 1. Bulky mediastinal, thoracic inlet, right supraclavicular, and left axillary lymphadenopathy consistent with metastatic disease. Associated numerous bilateral pulmonary nodules and masses, most prominently in the right middle lobe with dominant 4.1 cm peripheral mass lesion. 2. 16 mm rim enhancing lesion posterior right liver consistent with metastatic involvement. 3. 13 mm hypoenhancing lesion in the tail of pancreas. Primary neoplasm or metastatic involvement could have this appearance. 4. Multiple nodules in the upper abdomen concerning for peritoneal/mesenteric metastatic disease. 5. Small right and tiny left pleural effusions with bibasilar atelectasis.   PET 11/10/20: IMPRESSION: 1. Extensive hypermetabolic multi organ metastatic disease. 2. Bulky intensely hypermetabolic RIGHT supraclavicular, mediastinal and LEFT axillary adenopathy. 3. Hypermetabolic pulmonary mass in the RIGHT upper lobe with additional hypermetabolic nodules. 4. Hypermetabolic solitary hepatic metastasis. 5. Hypermetabolic lesion within the pancreas is also favored metastatic lesion. 6. Multifocal hypermetabolic skeletal metastasis. 7. Bilateral pleural effusions.   Now here for Rt SCLN bx - approved per Dr Annamaria Boots   Past Medical History:  Diagnosis Date  . Anxiety   . Asthma    does not take any medications, hx of inhaler use  . Breast cancer [facility])  1995/2016   right sided breast cancer in 08/1994  . Bronchitis    hx of  . GERD (gastroesophageal reflux disease)   . History of hiatal hernia   . Hypertension   . Hyperthyroidism   . Lung cancer ([place]) 11/02/2020    Past Surgical History:  Procedure Laterality Date  . BREAST BIOPSY Right 2016   malignant  . BREAST EXCISIONAL BIOPSY Left 1996   benign  . Carpel Tunnel  Bilateral   . INCONTINENCE SURGERY     with mesh insertion about 7 years ago  . LATISSIMUS FLAP TO BREAST Right 03/23/2015   Procedure: LATISSIMUS FLAP TO BREAST WITH PLACEMENT OF IMPLANT FOR BREAST RECONSTRUCTION;  Surgeon: [name], MD;  Location: [place];  Service: Plastics;  Laterality: Right;  . MASTECTOMY Right 03/23/2015  . RECONSTRUCTION BREAST W/ LATISSIMUS DORSI FLAP Right 03/23/2015  . TONSILLECTOMY     as a child  . TOTAL KNEE ARTHROPLASTY Right 03/13/2018   Procedure: RIGHT TOTAL KNEE ARTHROPLASTY;  Surgeon: Sydnee Cabal, MD;  Location: [facility];  Service: Orthopedics;  Laterality: Right;  Adductor Block  . TOTAL MASTECTOMY Right 03/23/2015   Procedure: RIGHT TOTAL MASTECTOMY;  Surgeon: [name], MD;  Location: [place];  Service: General;  Laterality: Right;    Allergies: Fosamax [alendronate sodium]  Medications: Prior to Admission medications   Medication Sig Start Date End Date Taking? Authorizing Provider  ALPRAZolam Duanne Moron) 0.5 MG tablet Take 1 tablet by mouth twice daily as needed 09/11/20  Yes [name], [name] [name], MD  cyclobenzaprine (FLEXERIL) 10 MG tablet Take 1 tablet (10 mg total) by mouth 3 (three) times daily as needed for muscle spasms. 10/18/19  Yes [name], [name] [name], MD  doxycycline (VIBRA-TABS) 100 MG tablet Take 1 tablet (100 mg total) by mouth 2 (  two) times daily. 10/24/20  Yes Elby Showers, MD  esomeprazole (NEXIUM) 40 MG capsule One po daily at bedtime 08/24/20  Yes [name], [name] [name], MD  EUTHYROX 75 MCG tablet Take 1 tablet by mouth daily 07/25/20  Yes [name], [name] [name], MD   HYDROcodone-acetaminophen [facility]) 5-325 MG tablet 1/2 to 1 tablet PO Q 6 hours prn pain 11/08/20  Yes Tanner, Lyndon Code., PA-C  HYDROcodone-homatropine (HYCODAN) 5-1.5 MG/5ML syrup Take 5 mLs by mouth every 8 (eight) hours as needed for cough. 10/24/20  Yes [name], [name] [name], MD  letrozole [facility]) 2.5 MG tablet Take 1 tablet (2.5 mg total) by mouth daily. 03/30/20  Yes [name], MD  metoprolol succinate (TOPROL-XL) 50 MG 24 hr tablet Take 1 tablet (50 mg total) by mouth 2 (two) times daily. 11/16/19  Yes Lorretta Harp, MD  simvastatin (ZOCOR) 40 MG tablet TAKE 1 TABLET BY MOUTH AT BEDTIME 10/03/20  Yes [name], [name] [name], MD  verapamil (CALAN-SR) 240 MG CR tablet Take 1 tablet by mouth once daily 09/11/20  Yes [name], [name] [name], MD     Family History  Problem Relation Age of Onset  . COPD Father   . Heart disease Father     Social History   Socioeconomic History  . Marital status: Married    Spouse name: Not on file  . Number of children: 2  . Years of education: Not on file  . Highest education level: Not on file  Occupational History  . Not on file  Tobacco Use  . Smoking status: Never Smoker  . Smokeless tobacco: Never Used  Substance and Sexual Activity  . Alcohol use: Yes    Comment: occasional  . Drug use: No  . Sexual activity: Not on file  Other Topics Concern  . Not on file  Social History Narrative  . Not on file   Social Determinants of Health   Financial Resource Strain: Not on file  Food Insecurity: Not on file  Transportation Needs: Not on file  Physical Activity: Not on file  Stress: Not on file  Social Connections: Not on file    Review of Systems: A 12 point ROS discussed and pertinent positives are indicated in the HPI above.  All other systems are negative.  Review of Systems  Constitutional: Positive for activity change and fatigue.  Respiratory: Positive for cough, shortness of breath and wheezing.   Cardiovascular: Negative for chest pain.   Gastrointestinal: Negative for abdominal pain.  Psychiatric/Behavioral: Negative for behavioral problems and confusion.    Vital Signs: BP 138/82   Pulse (!) 119   Temp 97.9 F (36.6 C) (Oral)   Ht 4\' 11"  (1.499 m)   Wt 138 lb (62.6 kg)   SpO2 96%   BMI 27.87 kg/m   Physical Exam Vitals reviewed.  HENT:     Mouth/Throat:     Mouth: Mucous membranes are moist.  Cardiovascular:     Rate and Rhythm: Normal rate and regular rhythm.     Heart sounds: Normal heart sounds.  Pulmonary:     Breath sounds: Wheezing present.  Abdominal:     Palpations: Abdomen is soft.     Tenderness: There is no abdominal tenderness.  Musculoskeletal:        General: Normal range of motion.  Skin:    General: Skin is warm.  Neurological:     Mental Status: She is alert and oriented to person, place, and time.  Psychiatric:  Behavior: Behavior normal.     Imaging: DG Chest 2 View  Result Date: 11/08/2020 CLINICAL DATA:  Increasing shortness of breath, chest pain. EXAM: CHEST - 2 VIEW COMPARISON:  CT chest 10/26/2020 and chest radiograph 10/24/2020. FINDINGS: Trachea is midline. Heart size stable. Bulky mediastinal adenopathy. Small bilateral pleural effusions with associated bibasilar volume loss. Bilateral pulmonary nodules and right middle lobe mass, better seen on 10/26/2020 CT. Surgical clips in the right axilla. IMPRESSION: 1. Bulky mediastinal adenopathy. Bilateral pulmonary nodules and right middle lobe mass, better seen on 10/26/2020. 2. Small bilateral pleural effusions with mild bibasilar volume loss. Electronically Signed   By: Lorin Picket M.D.   On: 11/08/2020 13:38   DG Chest 2 View  Result Date: 10/25/2020 CLINICAL DATA:  Right lower lobe pneumonia, chest pain and cough since Friday EXAM: CHEST - 2 VIEW COMPARISON:  Radiograph 03/14/2009, CT 09/26/2006 FINDINGS: Confluent opacities present in the right middle lobe with an almost masslike appearance on the lateral  radiograph though may be accentuated by overlying breast prostheses. However, there is additional conspicuous upper mediastinal widening with thickening along the right paratracheal stripe and a convex appearance of the left mediastinal border extending into the AP window as well as conspicuous perihilar fullness on the lateral view which raises concern for adenopathy. Some coarsened interstitial and bronchitic changes are present more diffusely. Blunting of the costophrenic sulci may be related to some hyperinflation flattening of the diaphragms versus trace effusions. No pneumothorax. No acute osseous or soft tissue abnormality. Degenerative changes are present in the imaged spine and shoulders. Surgical clips in the right axilla. IMPRESSION: 1. Confluent opacities in the right middle lobe with an almost masslike appearance on the lateral radiograph though may be accentuated by overlying breast prostheses. While infection is not excluded, the presence of additional conspicuous mediastinal and perihilar contours is worrisome for malignancy and metastatic disease. Recommend further evaluation with contrast enhanced chest CT. These results will be called to the ordering clinician or representative by the Radiologist Assistant, and communication documented in the PACS or [employer]. Electronically Signed   By: Lovena Le M.D.   On: 10/25/2020 00:15   CT Chest W Contrast  Result Date: 10/26/2020 CLINICAL DATA:  Mediastinal lymphadenopathy. EXAM: CT CHEST WITH CONTRAST TECHNIQUE: Multidetector CT imaging of the chest was performed during intravenous contrast administration. CONTRAST:  26mL ISOVUE-300 IOPAMIDOL (ISOVUE-300) INJECTION 61% COMPARISON:  Chest x-ray 10/24/2020 FINDINGS: Cardiovascular: The heart size is normal. No substantial pericardial effusion. Atherosclerotic calcification is noted in the wall of the thoracic aorta. Coronary artery calcification is evident. Mediastinum/Nodes: Bulky  mediastinal lymphadenopathy evident. Heterogeneously enhancing nodal conglomeration anterior mediastinum measures 5.6 x 3.8 cm and extends up into the left thoracic inlet where it attenuates but does not obstruct the left innominate vein. 3.9 x 2.2 cm nodal conglomeration identified in the right paratracheal station on 43/2. Subcarinal heterogeneously enhancing mass lesion measures 3.9 x 2.6 cm. 10 mm short axis right hilar lymph node evident. The esophagus has normal imaging features. 16 mm short axis lymph node identified left axilla. No right axillary lymphadenopathy. 15 mm short axis right supraclavicular node on image 1/series 2 has been incompletely visualized. Lungs/Pleura: Numerous scattered tiny bilateral pulmonary nodules identified measuring from about 3 mm up to approximately 6-7 mm. Several of the more dominant nodules are seen along fissures of the right lung. 2.0 x 1.2 cm pleural base nodule noted right middle lobe on 67/5 with another adjacent/confluent peripheral right middle lobe mass on 81/5  measuring 4.1 x 1.9 cm. Small right and tiny left pleural effusions evident. There is basilar atelectasis in the lower lobes bilaterally. Upper Abdomen: 16 mm rim enhancing lesion posterior right liver on 105/2. 6 mm soft tissue nodule identified right paramidline preperitoneal space on 01/30 6/2. Possible 7 mm mesenteric nodule posterior to the stomach on 140/2. 7 mm nodule seen adjacent to the left liver on 01/20 6/2. 13 mm hypoenhancing lesion identified in the tail of pancreas on 130/2. Musculoskeletal: No worrisome lytic or sclerotic osseous abnormality. IMPRESSION: 1. Bulky mediastinal, thoracic inlet, right supraclavicular, and left axillary lymphadenopathy consistent with metastatic disease. Associated numerous bilateral pulmonary nodules and masses, most prominently in the right middle lobe with dominant 4.1 cm peripheral mass lesion. 2. 16 mm rim enhancing lesion posterior right liver consistent with  metastatic involvement. 3. 13 mm hypoenhancing lesion in the tail of pancreas. Primary neoplasm or metastatic involvement could have this appearance. 4. Multiple nodules in the upper abdomen concerning for peritoneal/mesenteric metastatic disease. 5. Small right and tiny left pleural effusions with bibasilar atelectasis. 6. Aortic Atherosclerosis (ICD10-I70.0). Electronically Signed   By: Misty Stanley M.D.   On: 10/26/2020 12:54   NM PET Image Initial (PI) Skull Base To Thigh  Result Date: 11/10/2020 CLINICAL DATA:  Subsequent treatment strategy for breast carcinoma. Pulmonary mass. RIGHT breast carcinoma with mastectomy 2016. EXAM: NUCLEAR MEDICINE PET SKULL BASE TO THIGH TECHNIQUE: 6.9 mCi F-18 FDG was injected intravenously. Full-ring PET imaging was performed from the skull base to thigh after the radiotracer. CT data was obtained and used for attenuation correction and anatomic localization. Fasting blood glucose: 95 mg/dl COMPARISON:  10/26/2020 FINDINGS: Mediastinal blood pool activity: SUV max 2.6 Liver activity: SUV max NA NECK: Intensely hypermetabolic enlarged RIGHT supraclavicular nodes. Example 2.4 cm node on image 37 with SUV max equal 37.1. Incidental CT findings: none CHEST: Intensely hypermetabolic nodal mass in the prevascular space measures 3.5 cm with SUV max 35.9. There is extensive paratracheal subcarinal adenopathy with equal intense metabolic activity. Adenopathy extends into the RIGHT hilar nodes with SUV max equal 18.9. Hypermetabolic LEFT axillary lymph nodes additionally Along the anterior aspect of the RIGHT upper lobe there is a large mass along the pleural surface measuring 4.2 x 1.8 cm with SUV max equal 32. There several adjacent of pulmonary nodules also with intense metabolic activity Incidental CT findings: Bilateral layering moderate pleural effusions. Post RIGHT mastectomy with breast prosthetic. ABDOMEN/PELVIS: Solitary hypermetabolic lesion in the posterior RIGHT hepatic  lobe measures 1.6 cm with SUV max 6.3. Hypermetabolic mass centrally within pancreas measuring 1.8 cm with SUV max equal 13.2 on image 103. Incidental CT findings: none SKELETON: Multifocal skeletal metastasis within the pelvis, spine and RIGHT humerus. Example lesion in the central sacrum with SUV max equal 12. Minimal CT change at this level. Intense metabolic activity at L3 with SUV max equal 15. Activity occupies the near entirety of the vertebral body with minimal CT change. A focal lesion in the RIGHT proximal humerus is subtly seen within the marrow space on image 35). Incidental CT findings: none IMPRESSION: 1. Extensive hypermetabolic multi organ metastatic disease. 2. Bulky intensely hypermetabolic RIGHT supraclavicular, mediastinal and LEFT axillary adenopathy. 3. Hypermetabolic pulmonary mass in the RIGHT upper lobe with additional hypermetabolic nodules. 4. Hypermetabolic solitary hepatic metastasis. 5. Hypermetabolic lesion within the pancreas is also favored metastatic lesion. 6. Multifocal hypermetabolic skeletal metastasis. 7. Bilateral pleural effusions. Electronically Signed   By: Suzy Bouchard M.D.   On: 11/10/2020 14:04  Labs:  CBC: Recent Labs    02/14/20 1003 10/25/20 1450 11/08/20 1342  WBC 7.4 9.9 11.8*  HGB 11.7 11.9 10.9*  HCT 36.4 35.9 33.5*  PLT 249 334 314    COAGS: No results for input(s): INR, APTT in the last 8760 hours.  BMP: Recent Labs    02/14/20 1003 03/30/20 1357 10/25/20 1450 11/08/20 1342  NA 144 139 143 137  K 5.0 4.2 5.0 4.2  CL 111* 108 106 104  CO2 26 22 25 24   GLUCOSE 103* 107* 98 105*  BUN 24 23 20  20 13   CALCIUM 8.8 9.3 9.6 8.4*  CREATININE 1.22* 1.26* 1.23*  1.23* 1.01*  GFRNONAA 44* 43* 43* 59*  GFRAA 51* 49* 50*  --     LIVER FUNCTION TESTS: Recent Labs    02/14/20 1003 03/30/20 1357 08/21/20 0919 11/08/20 1342  BILITOT 0.3 0.5 0.4 0.4  AST 15 14* 18 42*  ALT 15 15 17 19   ALKPHOS  --  93  --  170*  PROT 6.6  7.1 6.6 6.6  ALBUMIN  --  3.9  --  3.0*    TUMOR MARKERS: No results for input(s): AFPTM, CEA, CA199, CHROMGRNA in the last 8760 hours.  Assessment and Plan:  Hx Breast Ca 1996/ 2016 New finding with SOB--- CT and +PET Multi organ metastatic disease Scheduled for Rt supraclavicular LN biopsy Risks and benefits of Rt supraclavicular lymph node biopsy was discussed with the patient and/or patient's family including, but not limited to bleeding, infection, damage to adjacent structures or low yield requiring additional tests.  All of the questions were answered and there is agreement to proceed. Consent signed and in chart.  Thank you for this interesting consult.  I greatly enjoyed meeting Tara Rodgers and look forward to participating in their care.  A copy of this report was sent to the requesting provider on this date.  Electronically Signed: Lavonia Drafts, PA-C 11/13/2020, 1:48 PM   I spent a total of  30 Minutes   in face to face in clinical consultation, greater than 50% of which was counseling/coordinating care for Rt SCLN bx

## 2020-11-14 ENCOUNTER — Ambulatory Visit
Admission: RE | Admit: 2020-11-14 | Discharge: 2020-11-14 | Disposition: A | Discharge status: Home / Self Care | Payer: Medicare Other | Source: Ambulatory Visit | Attending: Radiation Oncology | Admitting: Radiation Oncology

## 2020-11-14 ENCOUNTER — Other Ambulatory Visit: Payer: Self-pay

## 2020-11-14 DIAGNOSIS — C3491 Malignant neoplasm of unspecified part of right bronchus or lung: Secondary | ICD-10-CM | POA: Insufficient documentation

## 2020-11-14 DIAGNOSIS — Z853 Personal history of malignant neoplasm of breast: Secondary | ICD-10-CM | POA: Insufficient documentation

## 2020-11-14 DIAGNOSIS — C771 Secondary and unspecified malignant neoplasm of intrathoracic lymph nodes: Secondary | ICD-10-CM | POA: Diagnosis not present

## 2020-11-14 DIAGNOSIS — R109 Unspecified abdominal pain: Secondary | ICD-10-CM | POA: Diagnosis not present

## 2020-11-14 DIAGNOSIS — R1084 Generalized abdominal pain: Secondary | ICD-10-CM | POA: Diagnosis not present

## 2020-11-14 DIAGNOSIS — Z510 Encounter for antineoplastic radiation therapy: Secondary | ICD-10-CM | POA: Diagnosis not present

## 2020-11-14 DIAGNOSIS — C781 Secondary malignant neoplasm of mediastinum: Secondary | ICD-10-CM | POA: Diagnosis not present

## 2020-11-14 DIAGNOSIS — C786 Secondary malignant neoplasm of retroperitoneum and peritoneum: Secondary | ICD-10-CM | POA: Diagnosis not present

## 2020-11-14 DIAGNOSIS — C787 Secondary malignant neoplasm of liver and intrahepatic bile duct: Secondary | ICD-10-CM | POA: Diagnosis not present

## 2020-11-14 DIAGNOSIS — C50911 Malignant neoplasm of unspecified site of right female breast: Secondary | ICD-10-CM | POA: Diagnosis not present

## 2020-11-15 ENCOUNTER — Ambulatory Visit
Admission: RE | Admit: 2020-11-15 | Discharge: 2020-11-15 | Disposition: A | Discharge status: Home / Self Care | Payer: Medicare Other | Source: Ambulatory Visit | Attending: Radiation Oncology | Admitting: Radiation Oncology

## 2020-11-15 DIAGNOSIS — C771 Secondary and unspecified malignant neoplasm of intrathoracic lymph nodes: Secondary | ICD-10-CM | POA: Diagnosis not present

## 2020-11-15 DIAGNOSIS — C781 Secondary malignant neoplasm of mediastinum: Secondary | ICD-10-CM | POA: Diagnosis not present

## 2020-11-15 DIAGNOSIS — C50911 Malignant neoplasm of unspecified site of right female breast: Secondary | ICD-10-CM | POA: Diagnosis not present

## 2020-11-15 NOTE — Progress Notes (Signed)
[name]   [phone] [phone]   Clinic Follow up Note   Patient Care Team: [name], [name] [name], MD as PCP - General (Internal Medicine) [name], MD as Consulting Physician (Hematology) [name], MD (Inactive) as Consulting Physician (General Surgery) [name], NP as Nurse Practitioner (Nurse Practitioner) [name], MD as Consulting Physician (Plastic Surgery) [name], MD as Consulting Physician (Orthopedic Surgery)  Date of Service:  11/16/2020  CHIEF COMPLAINT: discuss biopsy result   SUMMARY OF ONCOLOGIC HISTORY: Oncology History Overview Note    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) and adjuvant radiation ([name]).   12/13/2014 Mammogram   Right breast: possible asymmetry warranting further evaluation with spot compression views and possibly ultrasound   12/16/2014 Breast US   Right breast: no definitive abnormality within the superior aspect of the right breast to correspond with the mammographic finding.   01/10/2015 Breast MRI   Right breast: irregular rim enhancing mass within the superior right breast at 11:30 measuring 2.5 x 2.4 x 2.4 cm. No abnormal enhancement is identified within the skin of the superior right breast in the area of concern on mammogram.   01/12/2015 Breast US   Second look diagnostic mammogram and ultrasound showed a 2.3 cm mass in the upper midline right breast. No axillary adenopathy.   01/12/2015 Initial Biopsy   Right breast needle core bx (upper  midline): Invasive ductal carcinoma, grade 2, ER+ (100%), PR+ (77%), HER2/neu negative (ratio 1.15), Ki67 20%    01/30/2015 Procedure   Breast High/Moderate Risk panel ([facility]) reveals no clinically significant variant at ATM, BRCA1, BRCA2, CDH1, CHEK2, PALB2, PTEN, STK11, and TP53.   02/08/2015 Clinical Stage   Stage IIA (T2 N0)   03/23/2015 Definitive Surgery   Right mastectomy ([name]): invasive adenocarcinoma, grade 3, 2.9 cm, HER2/neu repeated, negative (ratio 1.23)   03/23/2015 Oncotype testing   Score: 8 (6% ROR). No chemotherapy [name]).   03/23/2015 Pathologic Stage   Stage IIA: pT2 pNx   05/04/2015 -  Anti-estrogen oral therapy   Letrozole 2.90m daily. Planned duration of therapy at least 5 years.    07/25/2015 Survivorship   Survivorship visit completed and copy of care plan provided to patient.   12/15/2015 Mammogram   IMPRESSION: No mammographic evidence of malignancy. A result letter of this screening mammogram will be mailed directly to the patient.   12/23/2016 Mammogram   IMPRESSION: No mammographic evidence of malignancy. A result letter of this screening mammogram will be mailed directly to the patient.    12/30/2017 Mammogram   12/30/2017 Mammogram IMPRESSION: No mammographic evidence of malignancy. A result letter of this screening mammogram will be mailed directly to the patient.   03/27/2020 Mammogram   IMPRESSION: No mammographic evidence of malignancy. A result letter of this screening mammogram will be mailed directly to the patient.   10/26/2020 Imaging   CT chest IMPRESSION: 1. Bulky mediastinal, thoracic inlet, right supraclavicular, and left axillary lymphadenopathy consistent with metastatic disease. Associated numerous bilateral pulmonary nodules and masses, most prominently in the right  middle lobe with dominant 4.1 cm peripheral mass lesion. 2. 16 mm rim enhancing lesion posterior right liver consistent with metastatic involvement. 3. 13 mm  hypoenhancing lesion in the tail of pancreas. Primary neoplasm or metastatic involvement could have this appearance. 4. Multiple nodules in the upper abdomen concerning for peritoneal/mesenteric metastatic disease. 5. Small right and tiny left pleural effusions with bibasilar atelectasis. 6. Aortic Atherosclerosis (ICD10-I70.0).   11/10/2020 PET scan   IMPRESSION: 1. Extensive hypermetabolic multi organ metastatic disease. 2. Bulky intensely hypermetabolic RIGHT supraclavicular, mediastinal and LEFT axillary adenopathy. 3. Hypermetabolic pulmonary mass in the RIGHT upper lobe with additional hypermetabolic nodules. 4. Hypermetabolic solitary hepatic metastasis. 5. Hypermetabolic lesion within the pancreas is also favored metastatic lesion. 6. Multifocal hypermetabolic skeletal metastasis. 7. Bilateral pleural effusions.   11/13/2020 Procedure   Thoracentesis  IMPRESSION: Successful ultrasound guided right thoracentesis yielding 200 mL of pleural fluid.   11/13/2020 Pathology Results   Right supraclavicular LN Biopsy  Small cell lung cancer   Small cell lung cancer ([place])  11/13/2020 Cancer Staging   Staging form: Lung, AJCC 8th Edition - Clinical stage from 11/13/2020: Stage IV (cTX, cN3, cM1) - Signed by [name], MD on 11/16/2020 Stage prefix: Initial diagnosis   11/16/2020 Initial Diagnosis   Small cell lung cancer ([place])      CURRENT THERAPY:  Letrozole 2.5 mg once daily, started on 05/04/2015  INTERVAL HISTORY:  Tara Rodgers is here for a follow up. She presents to the clinic with her husband and daughter. She came in a wheelchair with oxygen. She is tolerating radiation moderately well, has developed mild dysphagia and odynophagia, she is on soft diet. She is very fatigued, not able to do much at home. She has low appetite, still able to drink about 30oz liquid a day, eats small meals.  Her dyspnea is stable, she can only walk for short distance. Does not sleep well.   All other systems were reviewed with the patient and are negative.  MEDICAL HISTORY:  Past Medical History:  Diagnosis Date  . Anxiety   . Asthma    does not take any medications, hx of inhaler use  . Breast cancer [facility]) 1995/2016   right sided breast cancer in 08/1994  . Bronchitis    hx of  . GERD (gastroesophageal reflux disease)   . History of hiatal hernia   . Hypertension   . Hyperthyroidism   . Lung cancer ([place]) 11/02/2020    SURGICAL HISTORY: Past Surgical History:  Procedure Laterality Date  . BREAST BIOPSY Right 2016   malignant  . BREAST EXCISIONAL BIOPSY Left 1996   benign  . Carpel Tunnel  Bilateral   . INCONTINENCE SURGERY     with mesh insertion about 7 years ago  . LATISSIMUS FLAP TO BREAST Right 03/23/2015   Procedure: LATISSIMUS FLAP TO BREAST WITH PLACEMENT OF IMPLANT FOR BREAST RECONSTRUCTION;  Surgeon: [name], MD;  Location: [place];  Service: Plastics;  Laterality: Right;  . MASTECTOMY Right 03/23/2015  . RECONSTRUCTION BREAST W/ LATISSIMUS DORSI FLAP Right 03/23/2015  . TONSILLECTOMY     as a child  . TOTAL KNEE ARTHROPLASTY Right 03/13/2018   Procedure: RIGHT TOTAL KNEE ARTHROPLASTY;  Surgeon: Sydnee Cabal, MD;  Location: [facility];  Service: Orthopedics;  Laterality: Right;  Adductor Block  . TOTAL MASTECTOMY Right 03/23/2015   Procedure: RIGHT TOTAL MASTECTOMY;  Surgeon: [name], MD;  Location: [place];  Service: General;  Laterality: Right;    I have reviewed  the social history and family history with the patient and they are unchanged from previous note.  ALLERGIES:  is allergic to fosamax [alendronate sodium].  MEDICATIONS:  Current Outpatient Medications  Medication Sig Dispense Refill  . ALPRAZolam (XANAX) 0.5 MG tablet Take 1 tablet by mouth twice daily as needed 180 tablet 0  . cyclobenzaprine (FLEXERIL) 10 MG tablet Take 1 tablet (10 mg total) by mouth 3 (three) times daily as needed for muscle spasms. 30 tablet 2  .  doxycycline (VIBRA-TABS) 100 MG tablet Take 1 tablet (100 mg total) by mouth 2 (two) times daily. 20 tablet 0  . esomeprazole (NEXIUM) 40 MG capsule One po daily at bedtime 90 capsule 1  . EUTHYROX 75 MCG tablet Take 1 tablet by mouth daily 90 tablet 0  . HYDROcodone-acetaminophen (NORCO) 5-325 MG tablet 1/2 to 1 tablet PO Q 6 hours prn pain 30 tablet 0  . HYDROcodone-homatropine (HYCODAN) 5-1.5 MG/5ML syrup Take 5 mLs by mouth every 8 (eight) hours as needed for cough. 120 mL 0  . letrozole (FEMARA) 2.5 MG tablet Take 1 tablet (2.5 mg total) by mouth daily. 90 tablet 3  . metoprolol succinate (TOPROL-XL) 50 MG 24 hr tablet Take 1 tablet (50 mg total) by mouth 2 (two) times daily. 180 tablet 3  . simvastatin (ZOCOR) 40 MG tablet TAKE 1 TABLET BY MOUTH AT BEDTIME 90 tablet 0  . verapamil (CALAN-SR) 240 MG CR tablet Take 1 tablet by mouth once daily 90 tablet 0   No current facility-administered medications for this visit.    PHYSICAL EXAMINATION: ECOG PERFORMANCE STATUS: 3 - Symptomatic, >50% confined to bed  Vitals:   11/16/20 1335  BP: 113/76  Pulse: (!) 127  Resp: 17  Temp: 97.9 F (36.6 C)  SpO2: 100%   Filed Weights   11/16/20 1335  Weight: 132 lb 4.8 oz (60 kg)    GENERAL:alert, mild distress, in wheelchair and on oxygen  SKIN: skin color, texture, turgor are normal, no rashes or significant lesions EYES: normal, Conjunctiva are pink and non-injected, sclera clear NECK: supple, thyroid normal size, non-tender, without nodularity LYMPH:  (+) large palpable node in right [place]  LUNGS: clear to auscultation and percussion with normal breathing effort HEART: regular rate & rhythm and no murmurs and no lower extremity edema ABDOMEN:abdomen soft, non-tender and normal bowel sounds Musculoskeletal:no cyanosis of digits and no clubbing  NEURO: alert & oriented x 3 with fluent speech, no focal motor/sensory deficits  LABORATORY DATA:  I have reviewed the data as listed CBC Latest  Ref Rng & Units 11/16/2020 11/13/2020 11/08/2020  WBC 4.0 - 10.5 K/uL 9.8 11.1(H) 11.8(H)  Hemoglobin 12.0 - 15.0 g/dL 11.3(L) 11.1(L) 10.9(L)  Hematocrit 36.0 - 46.0 % 35.9(L) 34.3(L) 33.5(L)  Platelets 150 - 400 K/uL 448(H) 377 314     CMP Latest Ref Rng & Units 11/16/2020 11/08/2020 10/25/2020  Glucose 70 - 99 mg/dL 102(H) 105(H) -  BUN 8 - 23 mg/dL _0 Creatinine 0.44 - 1.00 mg/dL 0.97 1.01(H) 1.23(H)  Sodium 135 - 145 mmol/L 139 137 -  Potassium 3.5 - 5.1 mmol/L 5.2(H) 4.2 -  Chloride 98 - 111 mmol/L 104 104 -  CO2 22 - 32 mmol/L 24 24 -  Calcium 8.9 - 10.3 mg/dL 9.1 8.4(L) -  Total Protein 6.5 - 8.1 g/dL 6.9 6.6 -  Total Bilirubin 0.3 - 1.2 mg/dL 0.4 0.4 -  Alkaline Phos 38 - 126 U/L 120 170(H) -  AST 15 -  41 U/L 54(H) 42(H) -  ALT 0 - 44 U/L 34 19 -      RADIOGRAPHIC STUDIES: I have personally reviewed the radiological images as listed and agreed with the findings in the report. No results found.   ASSESSMENT & PLAN:  PORSHE FLEAGLE is a 75 y.o. female with    1. 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 In the past 3 months she had progressive SOB and mild cough. She also has increasing right chest/flank pain.  -Her CT chest from 10/26/20 showed numerous bilateral pulmonary nodules and masses, most prominently in the right middle lobe with dominant 4.1 cm mass with Bulky mediastinal, thoracic inlet, right supraclavicular, and left axillary lymphadenopathy consistent with metastatic disease. Scan also shows 16 mm lesion in right liver along with Multiple nodules in the upper abdomen concerning for metastatic disease. There is also a 13 mm hypoenhancing lesion in the tail of pancreas.  -Her PET from 11/10/20 confirmed diffuse metastatic disease  -She has a large palpable right supraclavicular lymph nodes. Biopsy of LN  on 11/13/20 showed small cell lung cancer, TTF-1(+)  -I discussed the overall poor prognosis, given the aggressive nature of small cell lung cancer, and high disease burden.  -I discussed treatment options, with first-line chemotherapy carboplatin, etoposide and atezolizumab, every 3 weeks. --Chemotherapy consent: Side effects including but does not not limited to, fatigue, nausea, vomiting, diarrhea, hair loss, neuropathy, fluid retention, renal and kidney dysfunction, neutropenic fever, needed for blood transfusion, bleeding, were discussed with patient in great detail. She agrees to proceed. -The goal of chemotherapy is palliative, to prolong her life, and improve her quality of life -Patient has never smoked, which is very unusual for small cell lung cancer.  I will request [facility] on her biopsy. If no enough tissue, will get [facility]  -she is on palliative radiation this week, plan to complete next Friday  -plan to start chemo next week, I will discuss with Dr. Lisbeth Renshaw, to see if we can shorten her radiation course, and start systemic therapy sooner   2. Recurrent right breast invasive ductal carcinoma, pT2N0, stage II, grade 3, ER positive, PR positive, HER-2 negative -She was initiallydiagnosed in 1996 with right breast cancer. She was treated with right breast lumpectomy, adjuvant 6 months chemo with CMF and adjuvant radiation.  -She was diagnosed with cancer recurrence in 12/2014. She iss/prightmastectomy with completed resection.Oncotype showed low risk diseaseso I did not recommend adjuvantchemotherapy. -She started anti-estrogen therapy withletrozolein 04/2015. Plan for total of 7-10 years. Tolerating well.  3. Osteopenia -Shepreviouslyhad Osteoporosis. Shewasgetting Prolia at her PCP's office Every 6 months,she stopped due to high co-payafter about 2 years. She notes she did not tolerate oral Alendronate so she stopped after 5 years -Her DEXA from  9/30/19sheimprovedto osteopeniawithlowest T-Score of -2.2 at [facility] with fracture risk of 20% to spine and 6.4% risk of hip fracture.Her 03/27/20 DEXA showed Osteopenia with -2.3 at right hip.  -Shepreviouslydeclined bisphosphonate with Zometa injections. She has had at least 7 years of bisphosphonateand Proliain the past, which is adequate.  -continue calcium 1 g, vitamin D at least 1000 units daily, and weight-bearing exercise distress or bone.  4. Comorbidities: Arthritis, HTN, Tachycardia  -Shehad a right knee replacement in May 2019 -She continues   5. Social Support  -She notes her daughter and husband know she is  here but not aware of her scan results.  -I encouraged her to bring at least one of them to her next vitis. I am willing to contact them and discuss this.  -I offered her the chance to speak with our SW for counseling. She declined for now.   6. Goal of care discussion  -We again discussed the incurable nature of her cancer, and the overall poor prognosis, especially if she does not have good response to chemotherapy or progress on chemo -The patient understands the goal of care is palliative. -I recommend DNR/DNI, she will think about it. I encourage her to have living will    PLAN -biopsy result reviewed  -will start first line chemo with carboplatin, etoposide and atezolizumab in 1 to 2 weeks, depends on when she completes RT  -IVF 2hr tomorrow, will check uric acid, mag and phos to rule out TLS    No problem-specific Assessment & Plan notes found for this encounter.   No orders of the defined types were placed in this encounter.  All questions were answered. The patient knows to call the clinic with any problems, questions or concerns. No barriers to learning was detected. The total time spent in the appointment was 40 minutes.     [name], MD 11/16/2020   I, Joslyn Devon, am acting as scribe for [name], MD.   I have reviewed the above  documentation for accuracy and completeness, and I agree with the above.

## 2020-11-16 ENCOUNTER — Ambulatory Visit
Admission: RE | Admit: 2020-11-16 | Discharge: 2020-11-16 | Disposition: A | Discharge status: Home / Self Care | Payer: Medicare Other | Source: Ambulatory Visit | Attending: Radiation Oncology | Admitting: Radiation Oncology

## 2020-11-16 ENCOUNTER — Inpatient Hospital Stay: Payer: Medicare Other

## 2020-11-16 ENCOUNTER — Inpatient Hospital Stay: Payer: Medicare Other | Attending: Hematology | Admitting: Hematology

## 2020-11-16 ENCOUNTER — Other Ambulatory Visit: Payer: Self-pay

## 2020-11-16 ENCOUNTER — Encounter: Payer: Self-pay | Admitting: Hematology

## 2020-11-16 VITALS — BP 113/76 | HR 127 | Temp 97.9°F | Resp 17 | Ht 59.0 in | Wt 132.3 lb

## 2020-11-16 DIAGNOSIS — R590 Localized enlarged lymph nodes: Secondary | ICD-10-CM

## 2020-11-16 DIAGNOSIS — M8580 Other specified disorders of bone density and structure, unspecified site: Secondary | ICD-10-CM | POA: Diagnosis not present

## 2020-11-16 DIAGNOSIS — I10 Essential (primary) hypertension: Secondary | ICD-10-CM | POA: Diagnosis not present

## 2020-11-16 DIAGNOSIS — Z79811 Long term (current) use of aromatase inhibitors: Secondary | ICD-10-CM | POA: Insufficient documentation

## 2020-11-16 DIAGNOSIS — Z7189 Other specified counseling: Secondary | ICD-10-CM | POA: Insufficient documentation

## 2020-11-16 DIAGNOSIS — E039 Hypothyroidism, unspecified: Secondary | ICD-10-CM

## 2020-11-16 DIAGNOSIS — R000 Tachycardia, unspecified: Secondary | ICD-10-CM | POA: Insufficient documentation

## 2020-11-16 DIAGNOSIS — C50411 Malignant neoplasm of upper-outer quadrant of right female breast: Secondary | ICD-10-CM | POA: Diagnosis not present

## 2020-11-16 DIAGNOSIS — C7800 Secondary malignant neoplasm of unspecified lung: Secondary | ICD-10-CM | POA: Insufficient documentation

## 2020-11-16 DIAGNOSIS — Z5111 Encounter for antineoplastic chemotherapy: Secondary | ICD-10-CM | POA: Insufficient documentation

## 2020-11-16 DIAGNOSIS — Z923 Personal history of irradiation: Secondary | ICD-10-CM | POA: Diagnosis not present

## 2020-11-16 DIAGNOSIS — Z853 Personal history of malignant neoplasm of breast: Secondary | ICD-10-CM | POA: Insufficient documentation

## 2020-11-16 DIAGNOSIS — R918 Other nonspecific abnormal finding of lung field: Secondary | ICD-10-CM

## 2020-11-16 DIAGNOSIS — C787 Secondary malignant neoplasm of liver and intrahepatic bile duct: Secondary | ICD-10-CM | POA: Diagnosis not present

## 2020-11-16 DIAGNOSIS — M1990 Unspecified osteoarthritis, unspecified site: Secondary | ICD-10-CM | POA: Diagnosis not present

## 2020-11-16 DIAGNOSIS — C781 Secondary malignant neoplasm of mediastinum: Secondary | ICD-10-CM | POA: Diagnosis not present

## 2020-11-16 DIAGNOSIS — C3490 Malignant neoplasm of unspecified part of unspecified bronchus or lung: Secondary | ICD-10-CM | POA: Diagnosis not present

## 2020-11-16 DIAGNOSIS — Z170 Estrogen receptor positive status [ER+]: Secondary | ICD-10-CM | POA: Insufficient documentation

## 2020-11-16 DIAGNOSIS — C50911 Malignant neoplasm of unspecified site of right female breast: Secondary | ICD-10-CM | POA: Diagnosis not present

## 2020-11-16 DIAGNOSIS — C771 Secondary and unspecified malignant neoplasm of intrathoracic lymph nodes: Secondary | ICD-10-CM | POA: Diagnosis not present

## 2020-11-16 LAB — CBC WITH DIFFERENTIAL (CANCER CENTER ONLY)
Abs Immature Granulocytes: 0.08 10*3/uL — ABNORMAL HIGH (ref 0.00–0.07)
Basophils Absolute: 0 10*3/uL (ref 0.0–0.1)
Basophils Relative: 0 %
Eosinophils Absolute: 0.1 10*3/uL (ref 0.0–0.5)
Eosinophils Relative: 1 %
HCT: 35.9 % — ABNORMAL LOW (ref 36.0–46.0)
Hemoglobin: 11.3 g/dL — ABNORMAL LOW (ref 12.0–15.0)
Immature Granulocytes: 1 %
Lymphocytes Relative: 9 %
Lymphs Abs: 0.9 10*3/uL (ref 0.7–4.0)
MCH: 27.6 pg (ref 26.0–34.0)
MCHC: 31.5 g/dL (ref 30.0–36.0)
MCV: 87.8 fL (ref 80.0–100.0)
Monocytes Absolute: 0.8 10*3/uL (ref 0.1–1.0)
Monocytes Relative: 8 %
Neutro Abs: 8 10*3/uL — ABNORMAL HIGH (ref 1.7–7.7)
Neutrophils Relative %: 81 %
Platelet Count: 448 10*3/uL — ABNORMAL HIGH (ref 150–400)
RBC: 4.09 MIL/uL (ref 3.87–5.11)
RDW: 13.5 % (ref 11.5–15.5)
WBC Count: 9.8 10*3/uL (ref 4.0–10.5)
nRBC: 0 % (ref 0.0–0.2)

## 2020-11-16 LAB — CMP (CANCER CENTER ONLY)
ALT: 34 U/L (ref 0–44)
AST: 54 U/L — ABNORMAL HIGH (ref 15–41)
Albumin: 2.9 g/dL — ABNORMAL LOW (ref 3.5–5.0)
Alkaline Phosphatase: 120 U/L (ref 38–126)
Anion gap: 11 (ref 5–15)
BUN: 14 mg/dL (ref 8–23)
CO2: 24 mmol/L (ref 22–32)
Calcium: 9.1 mg/dL (ref 8.9–10.3)
Chloride: 104 mmol/L (ref 98–111)
Creatinine: 0.97 mg/dL (ref 0.44–1.00)
GFR, Estimated: >60 mL/min (ref >60)
Glucose, Bld: 102 mg/dL — ABNORMAL HIGH (ref 70–99)
Potassium: 5.2 mmol/L — ABNORMAL HIGH (ref 3.5–5.1)
Sodium: 139 mmol/L (ref 135–145)
Total Bilirubin: 0.4 mg/dL (ref 0.3–1.2)
Total Protein: 6.9 g/dL (ref 6.5–8.1)

## 2020-11-16 LAB — SURGICAL PATHOLOGY

## 2020-11-16 MED ORDER — ONDANSETRON HCL 8 MG PO TABS: 8.0000 mg | ORAL_TABLET | Freq: Two times a day (BID) | ORAL | 1 refills | Status: AC | PRN

## 2020-11-16 MED ORDER — PROCHLORPERAZINE MALEATE 10 MG PO TABS: 10.0000 mg | ORAL_TABLET | Freq: Four times a day (QID) | ORAL | 1 refills | Status: AC | PRN

## 2020-11-16 NOTE — Progress Notes (Signed)
START ON PATHWAY REGIMEN - Small Cell Lung     Cycles 1 through 4, every 21 days:     Atezolizumab      Carboplatin      Etoposide    Cycles 5 and beyond, every 21 days:     Atezolizumab   **Always confirm dose/schedule in your pharmacy ordering system**  Patient Characteristics: Newly Diagnosed, Preoperative or Nonsurgical Candidate (Clinical Staging), First Line, Extensive Stage Therapeutic Status: Newly Diagnosed, Preoperative or Nonsurgical Candidate (Clinical Staging) AJCC T Category: cT2 AJCC N Category: cN3 AJCC M Category: pM1c AJCC 8 Stage Grouping: IVB Stage Classification: Extensive  Intent of Therapy: Non-Curative / Palliative Intent, Discussed with Patient

## 2020-11-17 ENCOUNTER — Other Ambulatory Visit: Payer: Self-pay | Admitting: Hematology

## 2020-11-17 ENCOUNTER — Other Ambulatory Visit: Payer: Self-pay | Admitting: Radiation Oncology

## 2020-11-17 ENCOUNTER — Other Ambulatory Visit: Payer: Self-pay | Admitting: Internal Medicine

## 2020-11-17 ENCOUNTER — Ambulatory Visit
Admission: RE | Admit: 2020-11-17 | Discharge: 2020-11-17 | Disposition: A | Discharge status: Home / Self Care | Payer: Medicare Other | Source: Ambulatory Visit | Attending: Radiation Oncology | Admitting: Radiation Oncology

## 2020-11-17 ENCOUNTER — Inpatient Hospital Stay: Payer: Medicare Other

## 2020-11-17 ENCOUNTER — Other Ambulatory Visit: Payer: Self-pay

## 2020-11-17 VITALS — BP 112/61 | HR 88 | Temp 97.0°F | Resp 18 | Ht 59.0 in | Wt 131.8 lb

## 2020-11-17 DIAGNOSIS — R000 Tachycardia, unspecified: Secondary | ICD-10-CM | POA: Diagnosis not present

## 2020-11-17 DIAGNOSIS — C781 Secondary malignant neoplasm of mediastinum: Secondary | ICD-10-CM | POA: Diagnosis not present

## 2020-11-17 DIAGNOSIS — M1990 Unspecified osteoarthritis, unspecified site: Secondary | ICD-10-CM | POA: Diagnosis not present

## 2020-11-17 DIAGNOSIS — Z923 Personal history of irradiation: Secondary | ICD-10-CM | POA: Diagnosis not present

## 2020-11-17 DIAGNOSIS — Z170 Estrogen receptor positive status [ER+]: Secondary | ICD-10-CM | POA: Diagnosis not present

## 2020-11-17 DIAGNOSIS — C3490 Malignant neoplasm of unspecified part of unspecified bronchus or lung: Secondary | ICD-10-CM

## 2020-11-17 DIAGNOSIS — C50411 Malignant neoplasm of upper-outer quadrant of right female breast: Secondary | ICD-10-CM | POA: Diagnosis not present

## 2020-11-17 DIAGNOSIS — C787 Secondary malignant neoplasm of liver and intrahepatic bile duct: Secondary | ICD-10-CM | POA: Diagnosis not present

## 2020-11-17 DIAGNOSIS — C7800 Secondary malignant neoplasm of unspecified lung: Secondary | ICD-10-CM | POA: Diagnosis not present

## 2020-11-17 DIAGNOSIS — M8580 Other specified disorders of bone density and structure, unspecified site: Secondary | ICD-10-CM | POA: Diagnosis not present

## 2020-11-17 DIAGNOSIS — Z5111 Encounter for antineoplastic chemotherapy: Secondary | ICD-10-CM | POA: Diagnosis not present

## 2020-11-17 DIAGNOSIS — Z7189 Other specified counseling: Secondary | ICD-10-CM

## 2020-11-17 DIAGNOSIS — E860 Dehydration: Secondary | ICD-10-CM

## 2020-11-17 DIAGNOSIS — C771 Secondary and unspecified malignant neoplasm of intrathoracic lymph nodes: Secondary | ICD-10-CM | POA: Diagnosis not present

## 2020-11-17 DIAGNOSIS — Z853 Personal history of malignant neoplasm of breast: Secondary | ICD-10-CM | POA: Diagnosis not present

## 2020-11-17 DIAGNOSIS — E039 Hypothyroidism, unspecified: Secondary | ICD-10-CM

## 2020-11-17 DIAGNOSIS — C50911 Malignant neoplasm of unspecified site of right female breast: Secondary | ICD-10-CM | POA: Diagnosis not present

## 2020-11-17 DIAGNOSIS — Z79811 Long term (current) use of aromatase inhibitors: Secondary | ICD-10-CM | POA: Diagnosis not present

## 2020-11-17 DIAGNOSIS — I10 Essential (primary) hypertension: Secondary | ICD-10-CM | POA: Diagnosis not present

## 2020-11-17 LAB — BASIC METABOLIC PANEL - CANCER CENTER ONLY
Anion gap: 10 (ref 5–15)
BUN: 17 mg/dL (ref 8–23)
CO2: 24 mmol/L (ref 22–32)
Calcium: 8.9 mg/dL (ref 8.9–10.3)
Chloride: 103 mmol/L (ref 98–111)
Creatinine: 1.09 mg/dL — ABNORMAL HIGH (ref 0.44–1.00)
GFR, Estimated: 54 mL/min — ABNORMAL LOW (ref >60)
Glucose, Bld: 94 mg/dL (ref 70–99)
Potassium: 4.3 mmol/L (ref 3.5–5.1)
Sodium: 137 mmol/L (ref 135–145)

## 2020-11-17 LAB — PHOSPHORUS: Phosphorus: 4.2 mg/dL (ref 2.5–4.6)

## 2020-11-17 LAB — URIC ACID: Uric Acid, Serum: 7.3 mg/dL — ABNORMAL HIGH (ref 2.5–7.1)

## 2020-11-17 LAB — MAGNESIUM: Magnesium: 1.8 mg/dL (ref 1.7–2.4)

## 2020-11-17 LAB — TSH: TSH: 13.545 u[IU]/mL — ABNORMAL HIGH (ref 0.308–3.960)

## 2020-11-17 MED ORDER — SODIUM CHLORIDE 0.9 % IV SOLN
INTRAVENOUS | Status: AC
  Filled 2020-11-17 (×2): qty 250

## 2020-11-17 MED ORDER — SODIUM CHLORIDE 0.9 % IV SOLN
INTRAVENOUS | Status: DC
  Filled 2020-11-17: qty 250

## 2020-11-17 MED ORDER — ALLOPURINOL 300 MG PO TABS: 300.0000 mg | ORAL_TABLET | Freq: Every day | ORAL | 1 refills | Status: AC

## 2020-11-17 MED ORDER — SUCRALFATE 1 G PO TABS: 1.0000 g | ORAL_TABLET | Freq: Four times a day (QID) | ORAL | 2 refills | Status: DC

## 2020-11-17 NOTE — Addendum Note (Signed)
Addended by: [name] on: 11/17/2020 03:05 PM   Modules accepted: Orders

## 2020-11-17 NOTE — Patient Instructions (Signed)
Rehydration, Adult Rehydration is the replacement of body fluids, salts, and minerals (electrolytes) that are lost during dehydration. Dehydration is when there is not enough water or other fluids in the body. This happens when you lose more fluids than you take in. Common causes of dehydration include:  Not drinking enough fluids. This can occur when you are ill or doing activities that require a lot of energy, especially in hot weather.  Conditions that cause loss of water or other fluids, such as diarrhea, vomiting, sweating, or urinating a lot.  Other illnesses, such as fever or infection.  Certain medicines, such as those that remove excess fluid from the body (diuretics). Symptoms of mild or moderate dehydration may include thirst, dry lips and mouth, and dizziness. Symptoms of severe dehydration may include increased heart rate, confusion, fainting, and not urinating. For severe dehydration, you may need to get fluids through an IV at the hospital. For mild or moderate dehydration, you can usually rehydrate at home by drinking certain fluids as told by your health care provider. What are the risks? Generally, rehydration is safe. However, taking in too much fluid (overhydration) can be a problem. This is rare. Overhydration can cause an electrolyte imbalance, kidney failure, or a decrease in salt (sodium) levels in the body. Supplies needed You will need an oral rehydration solution (ORS) if your health care provider tells you to use one. This is a drink to treat dehydration. It can be found in pharmacies and retail stores. How to rehydrate Fluids Follow instructions from your health care provider for rehydration. The kind of fluid and the amount you should drink depend on your condition. In general, you should choose drinks that you prefer.  If told by your health care provider, drink an ORS. ? Make an ORS by following instructions on the package. ? Start by drinking small amounts,  about  cup (120 mL) every 5-10 minutes. ? Slowly increase how much you drink until you have taken the amount recommended by your health care provider.  Drink enough clear fluids to keep your urine pale yellow. If you were told to drink an ORS, finish it first, then start slowly drinking other clear fluids. Drink fluids such as: ? Water. This includes sparkling water and flavored water. Drinking only water can lead to having too little sodium in your body (hyponatremia). Follow the advice of your health care provider. ? Water from ice chips you suck on. ? Fruit juice with water you add to it (diluted). ? Sports drinks. ? Hot or cold herbal teas. ? Broth-based soups. ? Milk or milk products. Food Follow instructions from your health care provider about what to eat while you rehydrate. Your health care provider may recommend that you slowly begin eating regular foods in small amounts.  Eat foods that contain a healthy balance of electrolytes, such as bananas, oranges, potatoes, tomatoes, and spinach.  Avoid foods that are greasy or contain a lot of sugar. In some cases, you may get nutrition through a feeding tube that is passed through your nose and into your stomach (nasogastric tube, or NG tube). This may be done if you have uncontrolled vomiting or diarrhea.   Beverages to avoid Certain beverages may make dehydration worse. While you rehydrate, avoid drinking alcohol.   How to tell if you are recovering from dehydration You may be recovering from dehydration if:  You are urinating more often than before you started rehydrating.  Your urine is pale yellow.  Your energy level   improves.  You vomit less frequently.  You have diarrhea less frequently.  Your appetite improves or returns to normal.  You feel less dizzy or less light-headed.  Your skin tone and color start to look more normal. Follow these instructions at home:  Take over-the-counter and prescription medicines only  as told by your health care provider.  Do not take sodium tablets. Doing this can lead to having too much sodium in your body (hypernatremia). Contact a health care provider if:  You continue to have symptoms of mild or moderate dehydration, such as: ? Thirst. ? Dry lips. ? Slightly dry mouth. ? Dizziness. ? Dark urine or less urine than normal. ? Muscle cramps.  You continue to vomit or have diarrhea. Get help right away if you:  Have symptoms of dehydration that get worse.  Have a fever.  Have a severe headache.  Have been vomiting and the following happens: ? Your vomiting gets worse or does not go away. ? Your vomit includes blood or green matter (bile). ? You cannot eat or drink without vomiting.  Have problems with urination or bowel movements, such as: ? Diarrhea that gets worse or does not go away. ? Blood in your stool (feces). This may cause stool to look black and tarry. ? Not urinating, or urinating only a small amount of very dark urine, within 6-8 hours.  Have trouble breathing.  Have symptoms that get worse with treatment. These symptoms may represent a serious problem that is an emergency. Do not wait to see if the symptoms will go away. Get medical help right away. Call your local emergency services (911 in the [place] Do not drive yourself to the hospital. Summary  Rehydration is the replacement of body fluids and minerals (electrolytes) that are lost during dehydration.  Follow instructions from your health care provider for rehydration. The kind of fluid and amount you should drink depend on your condition.  Slowly increase how much you drink until you have taken the amount recommended by your health care provider.  Contact your health care provider if you continue to show signs of mild or moderate dehydration. This information is not intended to replace advice given to you by your health care provider. Make sure you discuss any questions you have with  your health care provider. Document Revised: 12/01/2019 Document Reviewed: 10/11/2019 Elsevier Patient Education  2021 [employer]vier Inc.  

## 2020-11-18 ENCOUNTER — Other Ambulatory Visit: Payer: Self-pay | Admitting: Internal Medicine

## 2020-11-18 ENCOUNTER — Other Ambulatory Visit: Payer: Self-pay | Admitting: Cardiovascular Disease

## 2020-11-20 ENCOUNTER — Other Ambulatory Visit: Payer: Self-pay | Admitting: Medical

## 2020-11-20 ENCOUNTER — Inpatient Hospital Stay: Payer: Medicare Other

## 2020-11-20 ENCOUNTER — Telehealth: Payer: Self-pay

## 2020-11-20 ENCOUNTER — Other Ambulatory Visit: Payer: Self-pay

## 2020-11-20 ENCOUNTER — Inpatient Hospital Stay (HOSPITAL_BASED_OUTPATIENT_CLINIC_OR_DEPARTMENT_OTHER): Payer: Medicare Other | Admitting: Medical

## 2020-11-20 ENCOUNTER — Ambulatory Visit
Admission: RE | Admit: 2020-11-20 | Discharge: 2020-11-20 | Disposition: A | Discharge status: Home / Self Care | Payer: Medicare Other | Source: Ambulatory Visit | Attending: Radiation Oncology | Admitting: Radiation Oncology

## 2020-11-20 VITALS — BP 101/49 | HR 91 | Temp 97.0°F | Resp 18 | Ht 59.0 in | Wt 134.0 lb

## 2020-11-20 DIAGNOSIS — R000 Tachycardia, unspecified: Secondary | ICD-10-CM | POA: Diagnosis not present

## 2020-11-20 DIAGNOSIS — C3490 Malignant neoplasm of unspecified part of unspecified bronchus or lung: Secondary | ICD-10-CM

## 2020-11-20 DIAGNOSIS — C771 Secondary and unspecified malignant neoplasm of intrathoracic lymph nodes: Secondary | ICD-10-CM | POA: Diagnosis not present

## 2020-11-20 DIAGNOSIS — Z923 Personal history of irradiation: Secondary | ICD-10-CM | POA: Diagnosis not present

## 2020-11-20 DIAGNOSIS — C7800 Secondary malignant neoplasm of unspecified lung: Secondary | ICD-10-CM

## 2020-11-20 DIAGNOSIS — Z170 Estrogen receptor positive status [ER+]: Secondary | ICD-10-CM | POA: Diagnosis not present

## 2020-11-20 DIAGNOSIS — Z79811 Long term (current) use of aromatase inhibitors: Secondary | ICD-10-CM

## 2020-11-20 DIAGNOSIS — Z853 Personal history of malignant neoplasm of breast: Secondary | ICD-10-CM | POA: Diagnosis not present

## 2020-11-20 DIAGNOSIS — C50411 Malignant neoplasm of upper-outer quadrant of right female breast: Secondary | ICD-10-CM | POA: Diagnosis not present

## 2020-11-20 DIAGNOSIS — C50911 Malignant neoplasm of unspecified site of right female breast: Secondary | ICD-10-CM | POA: Diagnosis not present

## 2020-11-20 DIAGNOSIS — M1990 Unspecified osteoarthritis, unspecified site: Secondary | ICD-10-CM | POA: Diagnosis not present

## 2020-11-20 DIAGNOSIS — C787 Secondary malignant neoplasm of liver and intrahepatic bile duct: Secondary | ICD-10-CM | POA: Diagnosis not present

## 2020-11-20 DIAGNOSIS — R531 Weakness: Secondary | ICD-10-CM

## 2020-11-20 DIAGNOSIS — C781 Secondary malignant neoplasm of mediastinum: Secondary | ICD-10-CM | POA: Diagnosis not present

## 2020-11-20 DIAGNOSIS — I10 Essential (primary) hypertension: Secondary | ICD-10-CM | POA: Diagnosis not present

## 2020-11-20 DIAGNOSIS — M8580 Other specified disorders of bone density and structure, unspecified site: Secondary | ICD-10-CM | POA: Diagnosis not present

## 2020-11-20 DIAGNOSIS — K5903 Drug induced constipation: Secondary | ICD-10-CM

## 2020-11-20 DIAGNOSIS — Z5111 Encounter for antineoplastic chemotherapy: Secondary | ICD-10-CM | POA: Diagnosis not present

## 2020-11-20 DIAGNOSIS — R296 Repeated falls: Secondary | ICD-10-CM

## 2020-11-20 LAB — CBC WITH DIFFERENTIAL (CANCER CENTER ONLY)
Abs Immature Granulocytes: 0.1 10*3/uL — ABNORMAL HIGH (ref 0.00–0.07)
Basophils Absolute: 0.1 10*3/uL (ref 0.0–0.1)
Basophils Relative: 1 %
Eosinophils Absolute: 0.1 10*3/uL (ref 0.0–0.5)
Eosinophils Relative: 2 %
HCT: 35.9 % — ABNORMAL LOW (ref 36.0–46.0)
Hemoglobin: 11.5 g/dL — ABNORMAL LOW (ref 12.0–15.0)
Immature Granulocytes: 2 %
Lymphocytes Relative: 11 %
Lymphs Abs: 0.7 10*3/uL (ref 0.7–4.0)
MCH: 27.8 pg (ref 26.0–34.0)
MCHC: 32 g/dL (ref 30.0–36.0)
MCV: 86.7 fL (ref 80.0–100.0)
Monocytes Absolute: 0.5 10*3/uL (ref 0.1–1.0)
Monocytes Relative: 8 %
Neutro Abs: 5.3 10*3/uL (ref 1.7–7.7)
Neutrophils Relative %: 76 %
Platelet Count: 482 10*3/uL — ABNORMAL HIGH (ref 150–400)
RBC: 4.14 MIL/uL (ref 3.87–5.11)
RDW: 13.4 % (ref 11.5–15.5)
WBC Count: 6.8 10*3/uL (ref 4.0–10.5)
nRBC: 0 % (ref 0.0–0.2)

## 2020-11-20 LAB — CMP (CANCER CENTER ONLY)
ALT: 24 U/L (ref 0–44)
AST: 40 U/L (ref 15–41)
Albumin: 3 g/dL — ABNORMAL LOW (ref 3.5–5.0)
Alkaline Phosphatase: 113 U/L (ref 38–126)
Anion gap: 11 (ref 5–15)
BUN: 11 mg/dL (ref 8–23)
CO2: 24 mmol/L (ref 22–32)
Calcium: 8.8 mg/dL — ABNORMAL LOW (ref 8.9–10.3)
Chloride: 103 mmol/L (ref 98–111)
Creatinine: 0.87 mg/dL (ref 0.44–1.00)
GFR, Estimated: >60 mL/min (ref >60)
Glucose, Bld: 87 mg/dL (ref 70–99)
Potassium: 3.9 mmol/L (ref 3.5–5.1)
Sodium: 138 mmol/L (ref 135–145)
Total Bilirubin: 0.3 mg/dL (ref 0.3–1.2)
Total Protein: 6.9 g/dL (ref 6.5–8.1)

## 2020-11-20 LAB — T4, FREE: Free T4: 0.75 ng/dL (ref 0.61–1.12)

## 2020-11-20 NOTE — Telephone Encounter (Signed)
I spoke with Tara Rodgers daughter, Tara Rodgers.  Tara Rodgers fell out of bed last night twice.  Tara Rodgers thinks she gets confusion from the pain medication and when she tries to get out of bed to go to the bathroom she slides out of bed.  She also states MR Hakimian has increased her O2 flow to 4.5L.  Lab and [facility] appts made for today after radiation.

## 2020-11-20 NOTE — Progress Notes (Signed)
[facility]pt[facility] Management [facility] Progress Note   Tara Rodgers [id number] 06/01/1946 75 y.o.  Tara Rodgers is managed by Dr. [name]  Actively treated with chemotherapy/immunotherapy/hormonal therapy: Currently receiving radiation therapy and pending start:  Cycles 1 through 4, every 21 days:     Atezolizumab      Carboplatin      Etoposide  Cycles 5 and beyond, every 21 days:     Atezolizumab   Next scheduled appointment with provider: To be scheduled  Assessment: Plan:    Small cell lung cancer ([place]) - Plan: Ambulatory referral to Home Health  Drug-induced constipation - Plan: Ambulatory referral to Home Health  Weakness - Plan: Ambulatory referral to [place] frequently - Plan: Ambulatory referral to [place]   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  This question will be directed to Dr. [name].   Drug-induced constipation: The patient was given an information sheet and was instructed to begin senna S, 1-2 p.o. twice daily.  She was also told to push fluids.   Weakness and frequent falls: The patient's medications were reviewed with her at length.  A referral has been made to home health for medication management, home safety evaluation, Occupational Therapy, and physical therapy.   Please see After Visit Summary for patient specific instructions.  Future Appointments  Date Time Provider [place]  11/21/2020  2:30 PM Kyung Rudd, MD [facility] None  11/22/2020  2:30 PM Kyung Rudd, MD [facility] None  11/23/2020  2:30 PM Kyung Rudd, MD [facility] None  11/24/2020  2:30 PM Kyung Rudd, MD [facility] None  11/27/2020  2:15 PM  Kyung Rudd, MD [facility] None  02/15/2021  9:00 AM Elby Showers, MD [facility]  02/19/2021  3:00 PM Elby Showers, MD [facility] [place]  03/29/2021  2:15 PM [facility] None  03/29/2021  2:45 PM Alla Feeling, NP [facility] None    Orders Placed This Encounter  Procedures  . Ambulatory referral to Home Health       Subjective:   Patient ID:  Tara Rodgers is a 75 y.o. (DOB Mar 06, 1946) female.  Chief Complaint: No chief complaint on file.   HPI Tara Rodgers  is a 75 y.o. 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  She continues to be managed by Dr. [name] and is receiving radiation therapy.  She is pending the start of chemotherapy.  According to Dr. Iline Oven note she plans to use the following chemotherapy:  Cycles 1 through 4, every 21 days:     Atezolizumab      Carboplatin      Etoposide  Cycles 5 and beyond, every 21 days:     Atezolizumab   No date has been set yet to begin chemotherapy.  The patient and her husband would like to know when she may start chemotherapy.  She reports ongoing drug-induced constipation.  She reports that she is weak.  She rolled out of bed onto the floor this weekend: The patient was given also fell while walking to the  bathroom recently.  She has ongoing pain in her hips and back but does not attribute this to her falls.  Her medications were reviewed with her today.  She is on alprazolam, Flexeril, Hycodan, and Vicodin.  She reports that she uses Flexeril regularly.  She continues on oxygen via nasal cannula and is ambulating with a wheelchair today.  Medications: I have reviewed the patient's current medications.  Allergies:  Allergies  Allergen Reactions  . Fosamax [Alendronate Sodium] Other (See Comments)    Aching all over    Past Medical History:  Diagnosis Date  . Anxiety   . Asthma    does not take any medications, hx of inhaler use  . Breast cancer [facility]) 1995/2016   right  sided breast cancer in 08/1994  . Bronchitis    hx of  . GERD (gastroesophageal reflux disease)   . History of hiatal hernia   . Hypertension   . Hyperthyroidism   . Lung cancer ([place]) 11/02/2020    Past Surgical History:  Procedure Laterality Date  . BREAST BIOPSY Right 2016   malignant  . BREAST EXCISIONAL BIOPSY Left 1996   benign  . Carpel Tunnel  Bilateral   . INCONTINENCE SURGERY     with mesh insertion about 7 years ago  . LATISSIMUS FLAP TO BREAST Right 03/23/2015   Procedure: LATISSIMUS FLAP TO BREAST WITH PLACEMENT OF IMPLANT FOR BREAST RECONSTRUCTION;  Surgeon: [name], MD;  Location: [place];  Service: Plastics;  Laterality: Right;  . MASTECTOMY Right 03/23/2015  . RECONSTRUCTION BREAST W/ LATISSIMUS DORSI FLAP Right 03/23/2015  . TONSILLECTOMY     as a child  . TOTAL KNEE ARTHROPLASTY Right 03/13/2018   Procedure: RIGHT TOTAL KNEE ARTHROPLASTY;  Surgeon: Sydnee Cabal, MD;  Location: [facility];  Service: Orthopedics;  Laterality: Right;  Adductor Block  . TOTAL MASTECTOMY Right 03/23/2015   Procedure: RIGHT TOTAL MASTECTOMY;  Surgeon: [name], MD;  Location: [place];  Service: General;  Laterality: Right;    Family History  Problem Relation Age of Onset  . COPD Father   . Heart disease Father     Social History   Socioeconomic History  . Marital status: Married    Spouse name: Not on file  . Number of children: 2  . Years of education: Not on file  . Highest education level: Not on file  Occupational History  . Not on file  Tobacco Use  . Smoking status: Never Smoker  . Smokeless tobacco: Never Used  Substance and Sexual Activity  . Alcohol use: Yes    Comment: occasional  . Drug use: No  . Sexual activity: Not on file  Other Topics Concern  . Not on file  Social History Narrative  . Not on file   Social Determinants of Health   Financial Resource Strain: Not on file  Food Insecurity: Not on file  Transportation Needs: Not on file   Physical Activity: Not on file  Stress: Not on file  Social Connections: Not on file  Intimate Partner Violence: Not on file    Past Medical History, Surgical history, Social history, and Family history were reviewed and updated as appropriate.   Please see review of systems for further details on the patient's review from today.   Review of Systems:  Review of Systems  Constitutional: Negative for chills, diaphoresis and fever.  HENT: Negative for trouble swallowing and voice change.   Respiratory: Positive for shortness of breath. Negative for cough, chest tightness  and wheezing.   Cardiovascular: Negative for chest pain and palpitations.  Gastrointestinal: Negative for abdominal pain, constipation, diarrhea, nausea and vomiting.  Musculoskeletal: Positive for arthralgias, back pain and gait problem. Negative for myalgias.  Neurological: Positive for weakness. Negative for dizziness, light-headedness and headaches.    Objective:   Physical Exam:  BP (!) 101/49 (Patient Position: Standing)   Pulse 91   Temp (!) 97 F (36.1 C) (Tympanic)   Resp 18   Ht 4\' 11"  (1.499 m)   Wt 134 lb (60.8 kg)   SpO2 96%   BMI 27.06 kg/m  ECOG: 1  Physical Exam Constitutional:      General: She is not in acute distress.    Appearance: She is not diaphoretic.  HENT:     Head: Normocephalic and atraumatic.  Eyes:     General: No scleral icterus.       Right eye: No discharge.        Left eye: No discharge.     Conjunctiva/sclera: Conjunctivae normal.  Cardiovascular:     Rate and Rhythm: Normal rate and regular rhythm.     Heart sounds: Normal heart sounds. No murmur heard. No friction rub. No gallop.   Pulmonary:     Effort: Pulmonary effort is normal. No respiratory distress.     Breath sounds: Normal breath sounds. No wheezing or rales.     Comments: The patient is receiving oxygen via nasal cannula. Skin:    General: Skin is warm and dry.     Findings: No erythema or rash.   Neurological:     Mental Status: She is alert.     Gait: Gait abnormal (The patient is ambulating with the use of a wheelchair.).     Lab Review:     Component Value Date/Time   NA 138 11/20/2020 1507   NA 142 04/17/2017 1242   K 3.9 11/20/2020 1507   K 4.2 04/17/2017 1242   CL 103 11/20/2020 1507   CO2 24 11/20/2020 1507   CO2 24 04/17/2017 1242   GLUCOSE 87 11/20/2020 1507   GLUCOSE 124 04/17/2017 1242   BUN 11 11/20/2020 1507   BUN 15.2 04/17/2017 1242   CREATININE 0.87 11/20/2020 1507   CREATININE 1.23 (H) 10/25/2020 1450   CREATININE 1.23 (H) 10/25/2020 1450   CREATININE 1.1 04/17/2017 1242   CALCIUM 8.8 (L) 11/20/2020 1507   CALCIUM 8.9 04/17/2017 1242   PROT 6.9 11/20/2020 1507   PROT 7.0 04/17/2017 1242   ALBUMIN 3.0 (L) 11/20/2020 1507   ALBUMIN 3.8 04/17/2017 1242   AST 40 11/20/2020 1507   AST 15 04/17/2017 1242   ALT 24 11/20/2020 1507   ALT 17 04/17/2017 1242   ALKPHOS 113 11/20/2020 1507   ALKPHOS 59 04/17/2017 1242   BILITOT 0.3 11/20/2020 1507   BILITOT 0.46 04/17/2017 1242   GFRNONAA >60 11/20/2020 1507   GFRNONAA 43 (L) 10/25/2020 1450   GFRAA 50 (L) 10/25/2020 1450       Component Value Date/Time   WBC 6.8 11/20/2020 1507   WBC 11.1 (H) 11/13/2020 1234   RBC 4.14 11/20/2020 1507   HGB 11.5 (L) 11/20/2020 1507   HGB 12.2 04/17/2017 1242   HCT 35.9 (L) 11/20/2020 1507   HCT 36.8 04/17/2017 1242   PLT 482 (H) 11/20/2020 1507   PLT 241 04/17/2017 1242   MCV 86.7 11/20/2020 1507   MCV 88.5 04/17/2017 1242   MCH 27.8 11/20/2020 1507   MCHC 32.0 11/20/2020 1507  RDW 13.4 11/20/2020 1507   RDW 14.2 04/17/2017 1242   LYMPHSABS 0.7 11/20/2020 1507   LYMPHSABS 1.7 04/17/2017 1242   MONOABS 0.5 11/20/2020 1507   MONOABS 0.4 04/17/2017 1242   EOSABS 0.1 11/20/2020 1507   EOSABS 0.1 04/17/2017 1242   BASOSABS 0.1 11/20/2020 1507   BASOSABS 0.0 04/17/2017 1242   -------------------------------  Imaging from last 24 hours (if  applicable):  Radiology interpretation: DG Chest 1 View  Result Date: 11/13/2020 CLINICAL DATA:  Shortness of breath.  Status post thoracentesis. EXAM: CHEST  1 VIEW COMPARISON:  November 08, 2020. FINDINGS: Stable cardiomediastinal silhouette. Mild left pleural effusion is noted with increased left basilar opacity concerning for worsening atelectasis or infiltrate. No significant pleural effusion is noted on the right. Right basilar atelectasis is noted. No pneumothorax is noted. Bony thorax is unremarkable. IMPRESSION: Mild left pleural effusion is noted with increased left basilar opacity concerning for worsening atelectasis or infiltrate. No pneumothorax is noted. Right basilar atelectasis is noted. Electronically Signed   By: Marijo Conception M.D.   On: 11/13/2020 16:10   DG Chest 2 View  Result Date: 11/08/2020 CLINICAL DATA:  Increasing shortness of breath, chest pain. EXAM: CHEST - 2 VIEW COMPARISON:  CT chest 10/26/2020 and chest radiograph 10/24/2020. FINDINGS: Trachea is midline. Heart size stable. Bulky mediastinal adenopathy. Small bilateral pleural effusions with associated bibasilar volume loss. Bilateral pulmonary nodules and right middle lobe mass, better seen on 10/26/2020 CT. Surgical clips in the right axilla. IMPRESSION: 1. Bulky mediastinal adenopathy. Bilateral pulmonary nodules and right middle lobe mass, better seen on 10/26/2020. 2. Small bilateral pleural effusions with mild bibasilar volume loss. Electronically Signed   By: Lorin Picket M.D.   On: 11/08/2020 13:38   DG Chest 2 View  Result Date: 10/25/2020 CLINICAL DATA:  Right lower lobe pneumonia, chest pain and cough since Friday EXAM: CHEST - 2 VIEW COMPARISON:  Radiograph 03/14/2009, CT 09/26/2006 FINDINGS: Confluent opacities present in the right middle lobe with an almost masslike appearance on the lateral radiograph though may be accentuated by overlying breast prostheses. However, there is additional conspicuous  upper mediastinal widening with thickening along the right paratracheal stripe and a convex appearance of the left mediastinal border extending into the AP window as well as conspicuous perihilar fullness on the lateral view which raises concern for adenopathy. Some coarsened interstitial and bronchitic changes are present more diffusely. Blunting of the costophrenic sulci may be related to some hyperinflation flattening of the diaphragms versus trace effusions. No pneumothorax. No acute osseous or soft tissue abnormality. Degenerative changes are present in the imaged spine and shoulders. Surgical clips in the right axilla. IMPRESSION: 1. Confluent opacities in the right middle lobe with an almost masslike appearance on the lateral radiograph though may be accentuated by overlying breast prostheses. While infection is not excluded, the presence of additional conspicuous mediastinal and perihilar contours is worrisome for malignancy and metastatic disease. Recommend further evaluation with contrast enhanced chest CT. These results will be called to the ordering clinician or representative by the Radiologist Assistant, and communication documented in the PACS or [employer]. Electronically Signed   By: Lovena Le M.D.   On: 10/25/2020 00:15   CT Chest W Contrast  Result Date: 10/26/2020 CLINICAL DATA:  Mediastinal lymphadenopathy. EXAM: CT CHEST WITH CONTRAST TECHNIQUE: Multidetector CT imaging of the chest was performed during intravenous contrast administration. CONTRAST:  35mL ISOVUE-300 IOPAMIDOL (ISOVUE-300) INJECTION 61% COMPARISON:  Chest x-ray 10/24/2020 FINDINGS: Cardiovascular: The heart size  is normal. No substantial pericardial effusion. Atherosclerotic calcification is noted in the wall of the thoracic aorta. Coronary artery calcification is evident. Mediastinum/Nodes: Bulky mediastinal lymphadenopathy evident. Heterogeneously enhancing nodal conglomeration anterior mediastinum measures 5.6 x  3.8 cm and extends up into the left thoracic inlet where it attenuates but does not obstruct the left innominate vein. 3.9 x 2.2 cm nodal conglomeration identified in the right paratracheal station on 43/2. Subcarinal heterogeneously enhancing mass lesion measures 3.9 x 2.6 cm. 10 mm short axis right hilar lymph node evident. The esophagus has normal imaging features. 16 mm short axis lymph node identified left axilla. No right axillary lymphadenopathy. 15 mm short axis right supraclavicular node on image 1/series 2 has been incompletely visualized. Lungs/Pleura: Numerous scattered tiny bilateral pulmonary nodules identified measuring from about 3 mm up to approximately 6-7 mm. Several of the more dominant nodules are seen along fissures of the right lung. 2.0 x 1.2 cm pleural base nodule noted right middle lobe on 67/5 with another adjacent/confluent peripheral right middle lobe mass on 81/5 measuring 4.1 x 1.9 cm. Small right and tiny left pleural effusions evident. There is basilar atelectasis in the lower lobes bilaterally. Upper Abdomen: 16 mm rim enhancing lesion posterior right liver on 105/2. 6 mm soft tissue nodule identified right paramidline preperitoneal space on 01/30 6/2. Possible 7 mm mesenteric nodule posterior to the stomach on 140/2. 7 mm nodule seen adjacent to the left liver on 01/20 6/2. 13 mm hypoenhancing lesion identified in the tail of pancreas on 130/2. Musculoskeletal: No worrisome lytic or sclerotic osseous abnormality. IMPRESSION: 1. Bulky mediastinal, thoracic inlet, right supraclavicular, and left axillary lymphadenopathy consistent with metastatic disease. Associated numerous bilateral pulmonary nodules and masses, most prominently in the right middle lobe with dominant 4.1 cm peripheral mass lesion. 2. 16 mm rim enhancing lesion posterior right liver consistent with metastatic involvement. 3. 13 mm hypoenhancing lesion in the tail of pancreas. Primary neoplasm or metastatic  involvement could have this appearance. 4. Multiple nodules in the upper abdomen concerning for peritoneal/mesenteric metastatic disease. 5. Small right and tiny left pleural effusions with bibasilar atelectasis. 6. Aortic Atherosclerosis (ICD10-I70.0). Electronically Signed   By: Misty Stanley M.D.   On: 10/26/2020 12:54   NM PET Image Initial (PI) Skull Base To Thigh  Result Date: 11/10/2020 CLINICAL DATA:  Subsequent treatment strategy for breast carcinoma. Pulmonary mass. RIGHT breast carcinoma with mastectomy 2016. EXAM: NUCLEAR MEDICINE PET SKULL BASE TO THIGH TECHNIQUE: 6.9 mCi F-18 FDG was injected intravenously. Full-ring PET imaging was performed from the skull base to thigh after the radiotracer. CT data was obtained and used for attenuation correction and anatomic localization. Fasting blood glucose: 95 mg/dl COMPARISON:  10/26/2020 FINDINGS: Mediastinal blood pool activity: SUV max 2.6 Liver activity: SUV max NA NECK: Intensely hypermetabolic enlarged RIGHT supraclavicular nodes. Example 2.4 cm node on image 37 with SUV max equal 37.1. Incidental CT findings: none CHEST: Intensely hypermetabolic nodal mass in the prevascular space measures 3.5 cm with SUV max 35.9. There is extensive paratracheal subcarinal adenopathy with equal intense metabolic activity. Adenopathy extends into the RIGHT hilar nodes with SUV max equal 18.9. Hypermetabolic LEFT axillary lymph nodes additionally Along the anterior aspect of the RIGHT upper lobe there is a large mass along the pleural surface measuring 4.2 x 1.8 cm with SUV max equal 32. There several adjacent of pulmonary nodules also with intense metabolic activity Incidental CT findings: Bilateral layering moderate pleural effusions. Post RIGHT mastectomy with breast prosthetic. ABDOMEN/PELVIS: Solitary hypermetabolic  lesion in the posterior RIGHT hepatic lobe measures 1.6 cm with SUV max 6.3. Hypermetabolic mass centrally within pancreas measuring 1.8 cm with SUV  max equal 13.2 on image 103. Incidental CT findings: none SKELETON: Multifocal skeletal metastasis within the pelvis, spine and RIGHT humerus. Example lesion in the central sacrum with SUV max equal 12. Minimal CT change at this level. Intense metabolic activity at L3 with SUV max equal 15. Activity occupies the near entirety of the vertebral body with minimal CT change. A focal lesion in the RIGHT proximal humerus is subtly seen within the marrow space on image 35). Incidental CT findings: none IMPRESSION: 1. Extensive hypermetabolic multi organ metastatic disease. 2. Bulky intensely hypermetabolic RIGHT supraclavicular, mediastinal and LEFT axillary adenopathy. 3. Hypermetabolic pulmonary mass in the RIGHT upper lobe with additional hypermetabolic nodules. 4. Hypermetabolic solitary hepatic metastasis. 5. Hypermetabolic lesion within the pancreas is also favored metastatic lesion. 6. Multifocal hypermetabolic skeletal metastasis. 7. Bilateral pleural effusions. Electronically Signed   By: Suzy Bouchard M.D.   On: 11/10/2020 14:04   [place] CORE BIOPSY (LYMPH NODES)  Result Date: 11/13/2020 INDICATION: 75 year old female with history of breast cancer, newly diagnosed pulmonary mass and right supraclavicular lymphadenopathy. EXAM: ULTRASOUND GUIDED core BIOPSY OF right supraclavicular lymph node MEDICATIONS: None. ANESTHESIA/SEDATION: Fentanyl 25 mcg IV; Versed 0.5 mg IV Moderate Sedation Time:  11 The patient was continuously monitored during the procedure by the interventional radiology nurse under my direct supervision. PROCEDURE: The procedure, risks, benefits, and alternatives were explained to the patient. Questions regarding the procedure were encouraged and answered. The patient understands and consents to the procedure. The right neck was prepped with chlorhexidine in a sterile fashion, and a sterile drape was applied covering the operative field. A sterile gown and sterile gloves were used for the  procedure. Local anesthesia was provided with 1% Lidocaine at the planned needle entry site. A small skin nick was made. Deeper local anesthesia was provided under direct ultrasound guidance along the periphery of the abnormal lymph node which measured up to approximately 2.8 cm in maximum diameter. An 18 gauge, 15 cm trocar needle was then advanced into the periphery of the abnormal lymph node and a total of 3 core biopsies were obtained. The biopsy samples were placed in formalin and sent to pathology. Postprocedure ultrasound evaluation demonstrated no evidence of surrounding hematoma. Hemostasis was achieved with brief manual compression at the needle entry site. A bandage was placed. The patient tolerated the procedure well and was discharged home. COMPLICATIONS: None immediate. IMPRESSION: Technically successful ultrasound-guided core biopsy of abnormal right supraclavicular lymph node. Ruthann Cancer, MD [facility] Electronically Signed   By: Ruthann Cancer MD   On: 11/13/2020 18:33   US THORACENTESIS ASP PLEURAL SPACE W/IMG GUIDE  Result Date: 11/13/2020 INDICATION: 75 year old female with history of right breast cancer and newly diagnosed pulmonary mass with increasing shortness of breath presenting for therapeutic thoracentesis at the time of right supraclavicular mass biopsy. EXAM: ULTRASOUND GUIDED RIGHT THORACENTESIS MEDICATIONS: None. COMPLICATIONS: None immediate. PROCEDURE: An ultrasound guided thoracentesis was thoroughly discussed with the patient and questions answered. The benefits, risks, alternatives and complications were also discussed. The patient understands and wishes to proceed with the procedure. Written consent was obtained. Ultrasound was performed to localize and mark an adequate pocket of fluid in the right chest. The area was then prepped and draped in the normal sterile fashion. 1% Lidocaine was used for local anesthesia.  Under ultrasound guidance a 6  Fr Safe-T-Centesis catheter was introduced. Thoracentesis was performed. The catheter was removed and a dressing applied. FINDINGS: A total of approximately 200 mL of translucent, straw-colored fluid was removed. IMPRESSION: Successful ultrasound guided right thoracentesis yielding 200 mL of pleural fluid. Ruthann Cancer, MD [facility] Electronically Signed   By: Ruthann Cancer MD   On: 11/13/2020 18:31

## 2020-11-21 ENCOUNTER — Other Ambulatory Visit: Payer: Self-pay

## 2020-11-21 ENCOUNTER — Other Ambulatory Visit: Payer: Self-pay | Admitting: Hematology

## 2020-11-21 ENCOUNTER — Ambulatory Visit
Admission: RE | Admit: 2020-11-21 | Discharge: 2020-11-21 | Disposition: A | Discharge status: Home / Self Care | Payer: Medicare Other | Source: Ambulatory Visit | Attending: Radiation Oncology | Admitting: Radiation Oncology

## 2020-11-21 DIAGNOSIS — C3490 Malignant neoplasm of unspecified part of unspecified bronchus or lung: Secondary | ICD-10-CM

## 2020-11-21 DIAGNOSIS — R531 Weakness: Secondary | ICD-10-CM

## 2020-11-21 DIAGNOSIS — C771 Secondary and unspecified malignant neoplasm of intrathoracic lymph nodes: Secondary | ICD-10-CM | POA: Diagnosis not present

## 2020-11-21 DIAGNOSIS — C781 Secondary malignant neoplasm of mediastinum: Secondary | ICD-10-CM | POA: Diagnosis not present

## 2020-11-21 DIAGNOSIS — C50911 Malignant neoplasm of unspecified site of right female breast: Secondary | ICD-10-CM | POA: Diagnosis not present

## 2020-11-21 NOTE — Progress Notes (Signed)
Referral, demographics, insurance cards, last 2 ov notes faxed to [facility] [phone].

## 2020-11-22 ENCOUNTER — Ambulatory Visit: Payer: Medicare Other | Admitting: Radiation Oncology

## 2020-11-22 ENCOUNTER — Inpatient Hospital Stay: Payer: Medicare Other

## 2020-11-22 ENCOUNTER — Encounter: Payer: Self-pay | Admitting: Hematology

## 2020-11-22 ENCOUNTER — Other Ambulatory Visit: Payer: Self-pay

## 2020-11-22 ENCOUNTER — Other Ambulatory Visit: Payer: Self-pay | Admitting: Hematology

## 2020-11-22 ENCOUNTER — Inpatient Hospital Stay (HOSPITAL_BASED_OUTPATIENT_CLINIC_OR_DEPARTMENT_OTHER): Payer: Medicare Other | Admitting: Hematology

## 2020-11-22 VITALS — BP 117/36 | HR 88 | Temp 98.1°F | Resp 18

## 2020-11-22 DIAGNOSIS — C3490 Malignant neoplasm of unspecified part of unspecified bronchus or lung: Secondary | ICD-10-CM | POA: Diagnosis not present

## 2020-11-22 DIAGNOSIS — M1990 Unspecified osteoarthritis, unspecified site: Secondary | ICD-10-CM | POA: Diagnosis not present

## 2020-11-22 DIAGNOSIS — I10 Essential (primary) hypertension: Secondary | ICD-10-CM | POA: Diagnosis not present

## 2020-11-22 DIAGNOSIS — Z7189 Other specified counseling: Secondary | ICD-10-CM

## 2020-11-22 DIAGNOSIS — C787 Secondary malignant neoplasm of liver and intrahepatic bile duct: Secondary | ICD-10-CM | POA: Diagnosis not present

## 2020-11-22 DIAGNOSIS — C50411 Malignant neoplasm of upper-outer quadrant of right female breast: Secondary | ICD-10-CM | POA: Diagnosis not present

## 2020-11-22 DIAGNOSIS — M8580 Other specified disorders of bone density and structure, unspecified site: Secondary | ICD-10-CM | POA: Diagnosis not present

## 2020-11-22 DIAGNOSIS — Z79811 Long term (current) use of aromatase inhibitors: Secondary | ICD-10-CM | POA: Diagnosis not present

## 2020-11-22 DIAGNOSIS — R000 Tachycardia, unspecified: Secondary | ICD-10-CM | POA: Diagnosis not present

## 2020-11-22 DIAGNOSIS — Z853 Personal history of malignant neoplasm of breast: Secondary | ICD-10-CM | POA: Diagnosis not present

## 2020-11-22 DIAGNOSIS — C7800 Secondary malignant neoplasm of unspecified lung: Secondary | ICD-10-CM | POA: Diagnosis not present

## 2020-11-22 DIAGNOSIS — Z923 Personal history of irradiation: Secondary | ICD-10-CM | POA: Diagnosis not present

## 2020-11-22 DIAGNOSIS — Z5111 Encounter for antineoplastic chemotherapy: Secondary | ICD-10-CM | POA: Diagnosis not present

## 2020-11-22 DIAGNOSIS — Z170 Estrogen receptor positive status [ER+]: Secondary | ICD-10-CM | POA: Diagnosis not present

## 2020-11-22 MED ORDER — SODIUM CHLORIDE 0.9 % IV SOLN
80.0000 mg/m2 | Freq: Once | INTRAVENOUS | Status: AC
  Administered 2020-11-22: 130 mg via INTRAVENOUS
  Filled 2020-11-22: qty 6.5

## 2020-11-22 MED ORDER — SODIUM CHLORIDE 0.9 % IV SOLN
150.0000 mg | Freq: Once | INTRAVENOUS | Status: AC
  Administered 2020-11-22: 150 mg via INTRAVENOUS
  Filled 2020-11-22: qty 150

## 2020-11-22 MED ORDER — PALONOSETRON HCL INJECTION 0.25 MG/5ML
INTRAVENOUS | Status: AC
  Filled 2020-11-22: qty 5

## 2020-11-22 MED ORDER — SODIUM CHLORIDE 0.9 % IV SOLN
1200.0000 mg | Freq: Once | INTRAVENOUS | Status: AC
  Administered 2020-11-22: 1200 mg via INTRAVENOUS
  Filled 2020-11-22: qty 20

## 2020-11-22 MED ORDER — SODIUM CHLORIDE 0.9 % IV SOLN
10.0000 mg | Freq: Once | INTRAVENOUS | Status: AC
  Administered 2020-11-22: 10 mg via INTRAVENOUS
  Filled 2020-11-22: qty 10

## 2020-11-22 MED ORDER — SODIUM CHLORIDE 0.9 % IV SOLN
Freq: Once | INTRAVENOUS | Status: AC
  Filled 2020-11-22: qty 250

## 2020-11-22 MED ORDER — SODIUM CHLORIDE 0.9 % IV SOLN
217.5000 mg | Freq: Once | INTRAVENOUS | Status: AC
Start: 2020-11-22 — End: 2020-11-22
  Administered 2020-11-22: 220 mg via INTRAVENOUS
  Filled 2020-11-22: qty 22

## 2020-11-22 MED ORDER — PALONOSETRON HCL INJECTION 0.25 MG/5ML
0.2500 mg | Freq: Once | INTRAVENOUS | Status: AC
  Administered 2020-11-22: 0.25 mg via INTRAVENOUS

## 2020-11-22 NOTE — Progress Notes (Signed)
This RN assisted pt to bathroom. O2 sats decreased to low 80's and HR 110-118 while ambulating/toileting. When returning to infusion chair pt coughing and c/o severe pain in abdomen. 10/10. O2 increased to 6L. At rest, pain in abdomen 3/10. Pt. States she is on 3-4L of O2 at home

## 2020-11-22 NOTE — Progress Notes (Signed)
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   SUMMARY OF ONCOLOGIC HISTORY: 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 [facility]) Staging form: Breast, AJCC 7th Edition - Clinical: Stage IIA (T2, N0, M0) - Signed by Rodgers Merle, MD on 02/08/2015 Laterality: Right Estrogen receptor status: Positive Progesterone receptor status: Positive HER2 status: Negative - Pathologic stage from 03/23/2015: Stage Unknown (T2, NX, cM0) - Unsigned  Small cell lung cancer ([place]) Staging form: Lung, AJCC 8th Edition - Clinical stage from 11/13/2020: Stage IV (cTX, cN3, cM1) - Signed by Rodgers Merle, MD on 11/16/2020 Stage prefix: Initial diagnosis     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) and adjuvant radiation ([name]).   12/13/2014 Mammogram   Right breast: possible asymmetry warranting further evaluation with spot compression views and possibly ultrasound   12/16/2014 Breast US   Right breast: no definitive abnormality within the superior aspect of the right breast to correspond with the mammographic finding.   01/10/2015 Breast MRI   Right breast: irregular  rim enhancing mass within the superior right breast at 11:30 measuring 2.5 x 2.4 x 2.4 cm. No abnormal enhancement is identified within the skin of the superior right breast in the area of concern on mammogram.   01/12/2015 Breast US   Second look diagnostic mammogram and ultrasound showed a 2.3 cm mass in the upper midline right breast. No axillary adenopathy.   01/12/2015 Initial Biopsy   Right breast needle core bx (upper midline): Invasive ductal carcinoma, grade 2, ER+ (100%), PR+ (77%), HER2/neu negative (ratio 1.15), Ki67 20%    01/30/2015 Procedure   Breast High/Moderate Risk panel ([facility]) reveals no clinically significant variant at ATM, BRCA1, BRCA2, CDH1, CHEK2, PALB2, PTEN, STK11, and TP53.   02/08/2015 Clinical Stage   Stage IIA (T2 N0)   03/23/2015 Definitive Surgery   Right mastectomy ([name]): invasive adenocarcinoma, grade 3, 2.9 cm, HER2/neu repeated, negative (ratio 1.23)   03/23/2015 Oncotype testing   Score: 8 (6% ROR). No chemotherapy [name]).   03/23/2015 Pathologic Stage   Stage IIA: pT2 pNx   05/04/2015 -  Anti-estrogen oral therapy   Letrozole 2.13m daily. Planned duration of therapy at least 5 years.    07/25/2015 Survivorship   Survivorship visit completed and copy of care plan provided to patient.   12/15/2015 Mammogram   IMPRESSION: No mammographic evidence of malignancy. A result letter of this screening mammogram will be mailed directly to the patient.   12/23/2016 Mammogram   IMPRESSION: No mammographic evidence of malignancy. A result letter of this screening mammogram will be mailed directly to the patient.    12/30/2017 Mammogram   12/30/2017 Mammogram IMPRESSION: No mammographic evidence of malignancy. A result letter of this screening mammogram will be mailed directly to the patient.  03/27/2020 Mammogram   IMPRESSION: No mammographic evidence of malignancy. A result letter of this screening mammogram will be mailed directly to the patient.    10/26/2020 Imaging   CT chest IMPRESSION: 1. Bulky mediastinal, thoracic inlet, right supraclavicular, and left axillary lymphadenopathy consistent with metastatic disease. Associated numerous bilateral pulmonary nodules and masses, most prominently in the right middle lobe with dominant 4.1 cm peripheral mass lesion. 2. 16 mm rim enhancing lesion posterior right liver consistent with metastatic involvement. 3. 13 mm hypoenhancing lesion in the tail of pancreas. Primary neoplasm or metastatic involvement could have this appearance. 4. Multiple nodules in the upper abdomen concerning for peritoneal/mesenteric metastatic disease. 5. Small right and tiny left pleural effusions with bibasilar atelectasis. 6. Aortic Atherosclerosis (ICD10-I70.0).   11/10/2020 PET scan   IMPRESSION: 1. Extensive hypermetabolic multi organ metastatic disease. 2. Bulky intensely hypermetabolic RIGHT supraclavicular, mediastinal and LEFT axillary adenopathy. 3. Hypermetabolic pulmonary mass in the RIGHT upper lobe with additional hypermetabolic nodules. 4. Hypermetabolic solitary hepatic metastasis. 5. Hypermetabolic lesion within the pancreas is also favored metastatic lesion. 6. Multifocal hypermetabolic skeletal metastasis. 7. Bilateral pleural effusions.   11/13/2020 Procedure   Thoracentesis  IMPRESSION: Successful ultrasound guided right thoracentesis yielding 200 mL of pleural fluid.   Small cell lung cancer (Tara Rodgers)  11/13/2020 Pathology Results   Right supraclavicular LN Biopsy  FINAL MICROSCOPIC DIAGNOSIS:   A. LYMPH NODE, RIGHT SUPRACLAVICULAR, NEEDLE CORE BIOPSY:  - Most consistent with small cell carcinoma.   COMMENT:   Tumor is limited in quantity, and partially necrotic.  TTF-1 and  Synaptophysin are positive.  Ki-67 proliferation index reaches 80-90%.  GATA-3, ER and p40 are negative.  The morphologic and immunophenotypic  characteristics are most suggestive of small cell  carcinoma.  TTF-1  positive staining is compatible with origin from the lung; however, it  does not exclude origin from other entities.  Please note that distinct  nodal tissue is not identified in the submitted material.  Results  reported to Dr. Truitt Tara on 11/16/2020.  Dr. Melina Copa reviewed the case   11/13/2020 Cancer Staging   Staging form: Lung, AJCC 8th Edition - Clinical stage from 11/13/2020: Stage IV (cTX, cN3, cM1) - Signed by Rodgers Merle, MD on 11/16/2020 Stage prefix: Initial diagnosis   11/13/2020 - 11/28/2020 Radiation Therapy   palliative radiation to Medistinum with Dr Lisbeth Renshaw 11/13/20-11/28/20   11/16/2020 Initial Diagnosis   Small cell lung cancer ([place])   11/22/2020 -  Chemotherapy   First line etoposide D1-3, carboplatin and atezolizumab D1 q3weeks beginning 11/22/20.           CURRENT THERAPY:  -palliative radiation to Mediastinum with Dr Lisbeth Renshaw 11/13/20-11/28/20 -First line etoposide D1-3, carboplatin and atezolizumab D1 q3weeks beginning 11/22/20.   INTERVAL HISTORY:  Tara Rodgers is here for a follow up. I called her husband to be included in the visit today. She had another Radiation treatment added, now to complete on 2/15. She notes this week she is tolerating radiation. She notes having dry mouth from oxygen, on 3L. She denies mouth sores. She is trying to eat, but has trouble due to feeling like she is choking so she stops after a few bites. She is able to tolerate liquid well. She is taking ensure boost which is easier than food. She was given Carafate by Dr Lisbeth Renshaw and she notes this makes her drowsy. She also has Hycodan and Norco. She notes she is fatigued and get help from her husband at night and a friend  during the day.    REVIEW OF SYSTEMS:   Constitutional: Denies fevers, chills or abnormal weight loss (+) Fatigue  Eyes: Denies blurriness of vision Ears, nose, mouth, throat, and face: Denies mucositis or sore throat (+) Dry mouth  Respiratory: Denies cough, dyspnea  or wheezes (+) On 3L [place] oxygen Cardiovascular: Denies palpitation, chest discomfort or lower extremity swelling Gastrointestinal:  Denies nausea, heartburn or change in bowel habits Skin: Denies abnormal skin rashes Lymphatics: Denies new lymphadenopathy or easy bruising Neurological:Denies numbness, tingling or new weaknesses Behavioral/Psych: Mood is stable, no new changes  All other systems were reviewed with the patient and are negative.  MEDICAL HISTORY:  Past Medical History:  Diagnosis Date  . Anxiety   . Asthma    does not take any medications, hx of inhaler use  . Breast cancer [facility]) 1995/2016   right sided breast cancer in 08/1994  . Bronchitis    hx of  . GERD (gastroesophageal reflux disease)   . History of hiatal hernia   . Hypertension   . Hyperthyroidism   . Lung cancer ([place]) 11/02/2020    SURGICAL HISTORY: Past Surgical History:  Procedure Laterality Date  . BREAST BIOPSY Right 2016   malignant  . BREAST EXCISIONAL BIOPSY Left 1996   benign  . Carpel Tunnel  Bilateral   . INCONTINENCE SURGERY     with mesh insertion about 7 years ago  . LATISSIMUS FLAP TO BREAST Right 03/23/2015   Procedure: LATISSIMUS FLAP TO BREAST WITH PLACEMENT OF IMPLANT FOR BREAST RECONSTRUCTION;  Surgeon: Rodgers Reese, MD;  Location: [facility];  Service: Plastics;  Laterality: Right;  . MASTECTOMY Right 03/23/2015  . RECONSTRUCTION BREAST W/ LATISSIMUS DORSI FLAP Right 03/23/2015  . TONSILLECTOMY     as a child  . TOTAL KNEE ARTHROPLASTY Right 03/13/2018   Procedure: RIGHT TOTAL KNEE ARTHROPLASTY;  Surgeon: Sydnee Cabal, MD;  Location: [facility];  Service: Orthopedics;  Laterality: Right;  Adductor Block  . TOTAL MASTECTOMY Right 03/23/2015   Procedure: RIGHT TOTAL MASTECTOMY;  Surgeon: Rodgers Seltzer, MD;  Location: [facility];  Service: General;  Laterality: Right;    I have reviewed the social history and family history with the patient and they are unchanged from previous  note.  ALLERGIES:  is allergic to fosamax [alendronate sodium].  MEDICATIONS:  Current Outpatient Medications  Medication Sig Dispense Refill  . allopurinol (ZYLOPRIM) 300 MG tablet Take 1 tablet (300 mg total) by mouth daily. 30 tablet 1  . ALPRAZolam (XANAX) 0.5 MG tablet Take 1 tablet by mouth twice daily as needed 180 tablet 0  . cyclobenzaprine (FLEXERIL) 10 MG tablet Take 1 tablet (10 mg total) by mouth 3 (three) times daily as needed for muscle spasms. 30 tablet 2  . doxycycline (VIBRA-TABS) 100 MG tablet Take 1 tablet (100 mg total) by mouth 2 (two) times daily. 20 tablet 0  . esomeprazole (NEXIUM) 40 MG capsule One po daily at bedtime 90 capsule 1  . EUTHYROX 75 MCG tablet Take 1 tablet by mouth once daily 90 tablet 0  . HYDROcodone-acetaminophen (NORCO) 5-325 MG tablet 1/2 to 1 tablet PO Q 6 hours prn pain 30 tablet 0  . HYDROcodone-homatropine (HYCODAN) 5-1.5 MG/5ML syrup Take 5 mLs by mouth every 8 (eight) hours as needed for cough. 120 mL 0  . letrozole (FEMARA) 2.5 MG tablet Take 1 tablet (2.5 mg total) by mouth daily. 90 tablet 3  . metoprolol succinate (TOPROL-XL) 50 MG 24 hr tablet Take 1 tablet by  mouth twice daily 60 tablet 1  . ondansetron (ZOFRAN) 8 MG tablet Take 1 tablet (8 mg total) by mouth 2 (two) times daily as needed for refractory nausea / vomiting. Start on day 3 after carboplatin chemo. 30 tablet 1  . prochlorperazine (COMPAZINE) 10 MG tablet Take 1 tablet (10 mg total) by mouth every 6 (six) hours as needed (Nausea or vomiting). 30 tablet 1  . simvastatin (ZOCOR) 40 MG tablet TAKE 1 TABLET BY MOUTH AT BEDTIME 90 tablet 0  . sucralfate (CARAFATE) 1 g tablet Take 1 tablet (1 g total) by mouth 4 (four) times daily. Dissolve each tablet in 15 cc water before use. 120 tablet 2  . verapamil (CALAN-SR) 240 MG CR tablet Take 1 tablet by mouth once daily 90 tablet 0   No current facility-administered medications for this visit.    PHYSICAL EXAMINATION: ECOG  PERFORMANCE STATUS: 3 - Symptomatic, >50% confined to bed  There were no vitals filed for this visit. There were no vitals filed for this visit.  Due to [place] we will limit examination to appearance. Patient had no complaints.  GENERAL:alert, no distress and comfortable SKIN: skin color normal, no rashes or significant lesions EYES: normal, Conjunctiva are pink and non-injected, sclera clear  NEURO: alert & oriented x 3 with fluent speech   LABORATORY DATA:  I have reviewed the data as listed CBC Latest Ref Rng & Units 11/20/2020 11/16/2020 11/13/2020  WBC 4.0 - 10.5 K/uL 6.8 9.8 11.1(H)  Hemoglobin 12.0 - 15.0 g/dL 11.5(L) 11.3(L) 11.1(L)  Hematocrit 36.0 - 46.0 % 35.9(L) 35.9(L) 34.3(L)  Platelets 150 - 400 K/uL 482(H) 448(H) 377     CMP Latest Ref Rng & Units 11/20/2020 11/17/2020 11/16/2020  Glucose 70 - 99 mg/dL 87 94 102(H)  BUN 8 - 23 mg/dL _0 Creatinine 0.44 - 1.00 mg/dL 0.87 1.09(H) 0.97  Sodium 135 - 145 mmol/L 138 137 139  Potassium 3.5 - 5.1 mmol/L 3.9 4.3 5.2(H)  Chloride 98 - 111 mmol/L 103 103 104  CO2 22 - 32 mmol/L _1 Calcium 8.9 - 10.3 mg/dL 8.8(L) 8.9 9.1  Total Protein 6.5 - 8.1 g/dL 6.9 - 6.9  Total Bilirubin 0.3 - 1.2 mg/dL 0.3 - 0.4  Alkaline Phos 38 - 126 U/L 113 - 120  AST 15 - 41 U/L 40 - 54(H)  ALT 0 - 44 U/L 24 - 34      RADIOGRAPHIC STUDIES: I have personally reviewed the radiological images as listed and agreed with the findings in the report. No results found.   ASSESSMENT & PLAN:  AARIONA MOMON is a 75 y.o. female with    1. Right small cell lung cancer with diffuse metastasis to lung, thoracic LNs, liver,pancrease, and peritoneal/mesenteric metastasis   -ShehadCOVID in 10/2019 and has had some breathing changes since. In the past 3 months she had progressive SOB and mild cough. She also has increasing right chest/flank pain.  -Her CT chest from 1/13/22showed numerous bilateral pulmonary nodules and masses, most prominently  in the right middle lobe with dominant 4.1 cmmass withBulky mediastinal, thoracic inlet, right supraclavicular, and left axillary lymphadenopathy consistent with metastatic disease. Scan also shows16 mm lesioninright liveralong withMultiple nodules in the upper abdomen concerningfor metastatic disease. There is also a13 mm hypoenhancing lesion in the tail of pancreas. -Her PET from 11/10/20 confirmed diffuse metastatic disease  -She has alargepalpable right supraclavicular lymph nodes. Biopsy of LN on 11/13/20 showed small cell lung cancer, TTF-1(+).  Her cancer is stage IV and no longer curable but still treatable.  -Given her metastatic lung cancer, I recommend MRI brain given high risk for brain mets. She is agreeable.  -I discussed the overall poor prognosis, given the aggressive nature of small cell lung cancer, and high disease burden.  -She is currently on palliative radiation to Mediastinum with Dr Lisbeth Renshaw 11/13/20-11/28/20. Tolerating well. She is currently on supplemental oxygen 3L.  -I started her on first-line chemo with etoposide D1-3, carboplatin and atezolizumab D1 q3weeks beginning today  -Labs reviewed and adequate to proceed with start of chemo today.  -I spoke with rad/onc NP Bryson Ha today, will skip her radiation today due to her chemo schedule, she is not able to to go to rad/onc before they close today  -F/u next week.   2. Recurrent right breast invasive ductal carcinoma, pT2N0, stage II, grade 3, ER positive, PR positive, HER-2 negative -She was initiallydiagnosed in 1996 with right breast cancer. She was treated with right breast lumpectomy, adjuvant 6 months chemo with CMF and adjuvant radiation.  -She was diagnosed with cancer recurrence in 12/2014. She iss/prightmastectomy with completed resection.Oncotype showed low risk diseaseso I did not recommend adjuvantchemotherapy. -She started anti-estrogen therapy withletrozolein 04/2015. Plan fortotal of 7-10  years.Tolerating well.  3. Osteopenia -Shepreviouslyhad Osteoporosis. Shewasgetting Prolia at her PCP's office Every 6 months,she stopped due to high co-payafter about 2 years. She notes she did not tolerate oral Alendronate so she stopped after 5 years -Her DEXA from 9/30/19sheimprovedto osteopeniawithlowest T-Score of -2.2 at [facility] with fracture risk of 20% to spine and 6.4% risk of hip fracture.Her 03/27/20 DEXA showed Osteopenia with -2.3 at right hip. -Shepreviouslydeclined bisphosphonate with Zometa injections. She has had at least 7 years of bisphosphonateand Proliain the past, which is adequate.  -continue calcium 1 g, vitamin D at least 1000 units daily, and weight-bearing exercise distress or bone.  4.Comorbidities:Arthritis,HTN, Tachycardia  -Shehad a right knee replacement in May 2019 -She continues to f/u with PCP   5. Social Support  -She notes her daughter and husband know she is here but not aware of her scan results.  -I encouraged her to bring at least one of them to her next vitis. I am willing to contact them and discuss this.  -I offered her the chance to speak with our SW for counseling. She declined for now.  6. Goal of care discussion  -We again discussed the incurable nature of her cancer, and the overall poor prognosis, especially if she does not have good response to chemotherapy or progress on chemo -The patient understands the goal of care is palliative. -I recommend DNR/DNI, she will think about it. I encourage her to have living will    PLAN -MRI brain in 1-2 weeks  -Continue Palliative Radiation until 2/15 -urgent PAC placement next week  -Labs reviewed and adequate to proceed with C1D1 etoposide carboplatin and atezolizumab. Continue etopside through D3.  -Lab, flush and f/u next week.    No problem-specific Assessment & Plan notes found for this encounter.   Orders Placed This Encounter  Procedures  . MR Brain W Wo  Contrast    Standing Status:   Future    Standing Expiration Date:   11/22/2021    Order Specific Question:   If indicated for the ordered procedure, I authorize the administration of contrast media per Radiology protocol    Answer:   Yes    Order Specific Question:   What is the patient's sedation  requirement?    Answer:   No Sedation    Order Specific Question:   Does the patient have a pacemaker or implanted devices?    Answer:   No    Order Specific Question:   Use SRS Protocol?    Answer:   No    Order Specific Question:   Preferred imaging location?    Answer:   [facility] (table limit - 550 lbs)   All questions were answered. The patient knows to call the clinic with any problems, questions or concerns. No barriers to learning was detected. The total time spent in the appointment was 30 minutes.     Rodgers Merle, MD 11/22/2020   I, Joslyn Devon, am acting as scribe for Rodgers Merle, MD.   I have reviewed the above documentation for accuracy and completeness, and I agree with the above.

## 2020-11-22 NOTE — Progress Notes (Signed)
The following biosimilar Udenyca ([id number]) has been selected for use in this patient.  Kennith Center, Pharm.D., CPP 11/22/2020@10 :15 AM

## 2020-11-22 NOTE — Patient Instructions (Signed)
[facility] Discharge Instructions for Patients Receiving Chemotherapy  Today you received the following chemotherapy agents tecentriq, etoposide, carboplatin  To help prevent nausea and vomiting after your treatment, we encourage you to take your nausea medication as directed.   If you develop nausea and vomiting that is not controlled by your nausea medication, call the clinic.   BELOW ARE SYMPTOMS THAT SHOULD BE REPORTED IMMEDIATELY:  *FEVER GREATER THAN 100.5 F  *CHILLS WITH OR WITHOUT FEVER  NAUSEA AND VOMITING THAT IS NOT CONTROLLED WITH YOUR NAUSEA MEDICATION  *UNUSUAL SHORTNESS OF BREATH  *UNUSUAL BRUISING OR BLEEDING  TENDERNESS IN MOUTH AND THROAT WITH OR WITHOUT PRESENCE OF ULCERS  *URINARY PROBLEMS  *BOWEL PROBLEMS  UNUSUAL RASH Items with * indicate a potential emergency and should be followed up as soon as possible.  Feel free to call the clinic should you have any questions or concerns. The clinic phone number is [phone].  Please show the [place] at check-in to the Emergency Department and triage nurse.

## 2020-11-23 ENCOUNTER — Inpatient Hospital Stay: Payer: Medicare Other

## 2020-11-23 ENCOUNTER — Ambulatory Visit: Payer: Medicare Other

## 2020-11-23 ENCOUNTER — Ambulatory Visit
Admission: RE | Admit: 2020-11-23 | Discharge: 2020-11-23 | Disposition: A | Discharge status: Home / Self Care | Payer: Medicare Other | Source: Ambulatory Visit | Attending: Radiation Oncology | Admitting: Radiation Oncology

## 2020-11-23 ENCOUNTER — Telehealth: Payer: Self-pay

## 2020-11-23 VITALS — BP 126/73 | HR 115 | Temp 97.5°F | Resp 18

## 2020-11-23 DIAGNOSIS — C787 Secondary malignant neoplasm of liver and intrahepatic bile duct: Secondary | ICD-10-CM | POA: Diagnosis not present

## 2020-11-23 DIAGNOSIS — M8580 Other specified disorders of bone density and structure, unspecified site: Secondary | ICD-10-CM | POA: Diagnosis not present

## 2020-11-23 DIAGNOSIS — R000 Tachycardia, unspecified: Secondary | ICD-10-CM | POA: Diagnosis not present

## 2020-11-23 DIAGNOSIS — C7800 Secondary malignant neoplasm of unspecified lung: Secondary | ICD-10-CM | POA: Diagnosis not present

## 2020-11-23 DIAGNOSIS — C50911 Malignant neoplasm of unspecified site of right female breast: Secondary | ICD-10-CM | POA: Diagnosis not present

## 2020-11-23 DIAGNOSIS — Z5111 Encounter for antineoplastic chemotherapy: Secondary | ICD-10-CM | POA: Diagnosis not present

## 2020-11-23 DIAGNOSIS — M1990 Unspecified osteoarthritis, unspecified site: Secondary | ICD-10-CM | POA: Diagnosis not present

## 2020-11-23 DIAGNOSIS — Z170 Estrogen receptor positive status [ER+]: Secondary | ICD-10-CM | POA: Diagnosis not present

## 2020-11-23 DIAGNOSIS — C3490 Malignant neoplasm of unspecified part of unspecified bronchus or lung: Secondary | ICD-10-CM

## 2020-11-23 DIAGNOSIS — C50411 Malignant neoplasm of upper-outer quadrant of right female breast: Secondary | ICD-10-CM | POA: Diagnosis not present

## 2020-11-23 DIAGNOSIS — Z923 Personal history of irradiation: Secondary | ICD-10-CM | POA: Diagnosis not present

## 2020-11-23 DIAGNOSIS — C771 Secondary and unspecified malignant neoplasm of intrathoracic lymph nodes: Secondary | ICD-10-CM | POA: Diagnosis not present

## 2020-11-23 DIAGNOSIS — C781 Secondary malignant neoplasm of mediastinum: Secondary | ICD-10-CM | POA: Diagnosis not present

## 2020-11-23 DIAGNOSIS — Z853 Personal history of malignant neoplasm of breast: Secondary | ICD-10-CM | POA: Diagnosis not present

## 2020-11-23 DIAGNOSIS — Z7189 Other specified counseling: Secondary | ICD-10-CM

## 2020-11-23 DIAGNOSIS — I10 Essential (primary) hypertension: Secondary | ICD-10-CM | POA: Diagnosis not present

## 2020-11-23 DIAGNOSIS — Z79811 Long term (current) use of aromatase inhibitors: Secondary | ICD-10-CM | POA: Diagnosis not present

## 2020-11-23 MED ORDER — SODIUM CHLORIDE 0.9 % IV SOLN
10.0000 mg | Freq: Once | INTRAVENOUS | Status: AC
  Administered 2020-11-23: 10 mg via INTRAVENOUS
  Filled 2020-11-23: qty 10

## 2020-11-23 MED ORDER — SODIUM CHLORIDE 0.9 % IV SOLN
80.0000 mg/m2 | Freq: Once | INTRAVENOUS | Status: AC
  Administered 2020-11-23: 130 mg via INTRAVENOUS
  Filled 2020-11-23: qty 6.5

## 2020-11-23 MED ORDER — SODIUM CHLORIDE 0.9 % IV SOLN
Freq: Once | INTRAVENOUS | Status: AC
  Filled 2020-11-23: qty 250

## 2020-11-23 NOTE — Progress Notes (Signed)
Per MD/RN states okay to leave PIV in over night until tomorrow, due to pt being a hard stick

## 2020-11-23 NOTE — Progress Notes (Signed)
Per Dr [name] ok to treat with HR 115.

## 2020-11-23 NOTE — Progress Notes (Signed)
Patient given oxygen tank to take home as she forgot her portable home O2. Patient states she will return it tomorrow.

## 2020-11-23 NOTE — Patient Instructions (Signed)
[facility] Discharge Instructions for Patients Receiving Chemotherapy  Today you received the following chemotherapy agents: etoposide  To help prevent nausea and vomiting after your treatment, we encourage you to take your nausea medication as directed.   If you develop nausea and vomiting that is not controlled by your nausea medication, call the clinic.   BELOW ARE SYMPTOMS THAT SHOULD BE REPORTED IMMEDIATELY:  *FEVER GREATER THAN 100.5 F  *CHILLS WITH OR WITHOUT FEVER  NAUSEA AND VOMITING THAT IS NOT CONTROLLED WITH YOUR NAUSEA MEDICATION  *UNUSUAL SHORTNESS OF BREATH  *UNUSUAL BRUISING OR BLEEDING  TENDERNESS IN MOUTH AND THROAT WITH OR WITHOUT PRESENCE OF ULCERS  *URINARY PROBLEMS  *BOWEL PROBLEMS  UNUSUAL RASH Items with * indicate a potential emergency and should be followed up as soon as possible.  Feel free to call the clinic should you have any questions or concerns. The clinic phone number is [phone].  Please show the CHEMO ALERT CARD at check-in to the Emergency Department and triage nurse.   

## 2020-11-24 ENCOUNTER — Ambulatory Visit
Admission: RE | Admit: 2020-11-24 | Discharge: 2020-11-24 | Disposition: A | Discharge status: Home / Self Care | Payer: Medicare Other | Source: Ambulatory Visit | Attending: Radiation Oncology | Admitting: Radiation Oncology

## 2020-11-24 ENCOUNTER — Inpatient Hospital Stay: Payer: Medicare Other

## 2020-11-24 ENCOUNTER — Ambulatory Visit: Payer: Medicare Other | Admitting: Radiation Oncology

## 2020-11-24 ENCOUNTER — Other Ambulatory Visit: Payer: Self-pay

## 2020-11-24 ENCOUNTER — Ambulatory Visit: Payer: Medicare Other

## 2020-11-24 VITALS — BP 124/94 | HR 110 | Temp 97.5°F | Resp 20

## 2020-11-24 DIAGNOSIS — Z170 Estrogen receptor positive status [ER+]: Secondary | ICD-10-CM | POA: Diagnosis not present

## 2020-11-24 DIAGNOSIS — C771 Secondary and unspecified malignant neoplasm of intrathoracic lymph nodes: Secondary | ICD-10-CM | POA: Diagnosis not present

## 2020-11-24 DIAGNOSIS — C3490 Malignant neoplasm of unspecified part of unspecified bronchus or lung: Secondary | ICD-10-CM

## 2020-11-24 DIAGNOSIS — Z5111 Encounter for antineoplastic chemotherapy: Secondary | ICD-10-CM | POA: Diagnosis not present

## 2020-11-24 DIAGNOSIS — I10 Essential (primary) hypertension: Secondary | ICD-10-CM | POA: Diagnosis not present

## 2020-11-24 DIAGNOSIS — C7800 Secondary malignant neoplasm of unspecified lung: Secondary | ICD-10-CM | POA: Diagnosis not present

## 2020-11-24 DIAGNOSIS — Z853 Personal history of malignant neoplasm of breast: Secondary | ICD-10-CM | POA: Diagnosis not present

## 2020-11-24 DIAGNOSIS — M1990 Unspecified osteoarthritis, unspecified site: Secondary | ICD-10-CM | POA: Diagnosis not present

## 2020-11-24 DIAGNOSIS — C787 Secondary malignant neoplasm of liver and intrahepatic bile duct: Secondary | ICD-10-CM | POA: Diagnosis not present

## 2020-11-24 DIAGNOSIS — R000 Tachycardia, unspecified: Secondary | ICD-10-CM | POA: Diagnosis not present

## 2020-11-24 DIAGNOSIS — Z923 Personal history of irradiation: Secondary | ICD-10-CM | POA: Diagnosis not present

## 2020-11-24 DIAGNOSIS — Z79811 Long term (current) use of aromatase inhibitors: Secondary | ICD-10-CM | POA: Diagnosis not present

## 2020-11-24 DIAGNOSIS — Z7189 Other specified counseling: Secondary | ICD-10-CM

## 2020-11-24 DIAGNOSIS — C50911 Malignant neoplasm of unspecified site of right female breast: Secondary | ICD-10-CM | POA: Diagnosis not present

## 2020-11-24 DIAGNOSIS — C50411 Malignant neoplasm of upper-outer quadrant of right female breast: Secondary | ICD-10-CM | POA: Diagnosis not present

## 2020-11-24 DIAGNOSIS — M8580 Other specified disorders of bone density and structure, unspecified site: Secondary | ICD-10-CM | POA: Diagnosis not present

## 2020-11-24 DIAGNOSIS — C781 Secondary malignant neoplasm of mediastinum: Secondary | ICD-10-CM | POA: Diagnosis not present

## 2020-11-24 MED ORDER — SODIUM CHLORIDE 0.9 % IV SOLN
10.0000 mg | Freq: Once | INTRAVENOUS | Status: AC
  Administered 2020-11-24: 10 mg via INTRAVENOUS
  Filled 2020-11-24: qty 10

## 2020-11-24 MED ORDER — SODIUM CHLORIDE 0.9 % IV SOLN
Freq: Once | INTRAVENOUS | Status: AC
  Filled 2020-11-24: qty 250

## 2020-11-24 MED ORDER — SODIUM CHLORIDE 0.9% FLUSH
10.0000 mL | INTRAVENOUS | Status: DC | PRN
  Filled 2020-11-24: qty 10

## 2020-11-24 MED ORDER — SODIUM CHLORIDE 0.9 % IV SOLN
80.0000 mg/m2 | Freq: Once | INTRAVENOUS | Status: AC
  Administered 2020-11-24: 130 mg via INTRAVENOUS
  Filled 2020-11-24: qty 6.5

## 2020-11-24 NOTE — Progress Notes (Signed)
Per Dr [name] ok to treat with HR 110

## 2020-11-24 NOTE — Progress Notes (Signed)
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   SUMMARY OF ONCOLOGIC HISTORY: 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 [facility]) Staging form: Breast, AJCC 7th Edition - Clinical: Stage IIA (T2, N0, M0) - Signed by [name], MD on 02/08/2015 Laterality: Right Estrogen receptor status: Positive Progesterone receptor status: Positive HER2 status: Negative - Pathologic stage from 03/23/2015: Stage Unknown (T2, NX, cM0) - Unsigned  Small cell lung cancer ([place]) Staging form: Lung, AJCC 8th Edition - Clinical stage from 11/13/2020: Stage IV (cTX, cN3, cM1) - Signed by [name], MD on 11/16/2020 Stage prefix: Initial diagnosis     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) and adjuvant radiation ([name]).   12/13/2014 Mammogram   Right breast: possible asymmetry warranting further evaluation with spot compression views and possibly ultrasound   12/16/2014 Breast US   Right breast: no definitive abnormality within the superior aspect of the right breast to correspond with the mammographic finding.   01/10/2015 Breast MRI   Right breast: irregular  rim enhancing mass within the superior right breast at 11:30 measuring 2.5 x 2.4 x 2.4 cm. No abnormal enhancement is identified within the skin of the superior right breast in the area of concern on mammogram.   01/12/2015 Breast US   Second look diagnostic mammogram and ultrasound showed a 2.3 cm mass in the upper midline right breast. No axillary adenopathy.   01/12/2015 Initial Biopsy   Right breast needle core bx (upper midline): Invasive ductal carcinoma, grade 2, ER+ (100%), PR+ (77%), HER2/neu negative (ratio 1.15), Ki67 20%    01/30/2015 Procedure   Breast High/Moderate Risk panel ([facility]) reveals no clinically significant variant at ATM, BRCA1, BRCA2, CDH1, CHEK2, PALB2, PTEN, STK11, and TP53.   02/08/2015 Clinical Stage   Stage IIA (T2 N0)   03/23/2015 Definitive Surgery   Right mastectomy ([name]): invasive adenocarcinoma, grade 3, 2.9 cm, HER2/neu repeated, negative (ratio 1.23)   03/23/2015 Oncotype testing   Score: 8 (6% ROR). No chemotherapy [name]).   03/23/2015 Pathologic Stage   Stage IIA: pT2 pNx   05/04/2015 -  Anti-estrogen oral therapy   Letrozole 2.15m daily. Planned duration of therapy at least 5 years.    07/25/2015 Survivorship   Survivorship visit completed and copy of care plan provided to patient.   12/15/2015 Mammogram   IMPRESSION: No mammographic evidence of malignancy. A result letter of this screening mammogram will be mailed directly to the patient.   12/23/2016 Mammogram   IMPRESSION: No mammographic evidence of malignancy. A result letter of this screening mammogram will be mailed directly to the patient.    12/30/2017 Mammogram   12/30/2017 Mammogram IMPRESSION: No mammographic evidence of malignancy. A result letter of this screening mammogram will be mailed directly to the patient.  03/27/2020 Mammogram   IMPRESSION: No mammographic evidence of malignancy. A result letter of this screening mammogram will be mailed directly to the patient.    10/26/2020 Imaging   CT chest IMPRESSION: 1. Bulky mediastinal, thoracic inlet, right supraclavicular, and left axillary lymphadenopathy consistent with metastatic disease. Associated numerous bilateral pulmonary nodules and masses, most prominently in the right middle lobe with dominant 4.1 cm peripheral mass lesion. 2. 16 mm rim enhancing lesion posterior right liver consistent with metastatic involvement. 3. 13 mm hypoenhancing lesion in the tail of pancreas. Primary neoplasm or metastatic involvement could have this appearance. 4. Multiple nodules in the upper abdomen concerning for peritoneal/mesenteric metastatic disease. 5. Small right and tiny left pleural effusions with bibasilar atelectasis. 6. Aortic Atherosclerosis (ICD10-I70.0).   11/10/2020 PET scan   IMPRESSION: 1. Extensive hypermetabolic multi organ metastatic disease. 2. Bulky intensely hypermetabolic RIGHT supraclavicular, mediastinal and LEFT axillary adenopathy. 3. Hypermetabolic pulmonary mass in the RIGHT upper lobe with additional hypermetabolic nodules. 4. Hypermetabolic solitary hepatic metastasis. 5. Hypermetabolic lesion within the pancreas is also favored metastatic lesion. 6. Multifocal hypermetabolic skeletal metastasis. 7. Bilateral pleural effusions.   11/13/2020 Procedure   Thoracentesis  IMPRESSION: Successful ultrasound guided right thoracentesis yielding 200 mL of pleural fluid.   Small cell lung cancer ([place])  11/13/2020 Pathology Results   Right supraclavicular LN Biopsy  FINAL MICROSCOPIC DIAGNOSIS:   A. LYMPH NODE, RIGHT SUPRACLAVICULAR, NEEDLE CORE BIOPSY:  - Most consistent with small cell carcinoma.   COMMENT:   Tumor is limited in quantity, and partially necrotic.  TTF-1 and  Synaptophysin are positive.  Ki-67 proliferation index reaches 80-90%.  GATA-3, ER and p40 are negative.  The morphologic and immunophenotypic  characteristics are most suggestive of small cell  carcinoma.  TTF-1  positive staining is compatible with origin from the lung; however, it  does not exclude origin from other entities.  Please note that distinct  nodal tissue is not identified in the submitted material.  Results  reported to Dr. [name] on 11/16/2020.  Dr. Melina Copa reviewed the case   11/13/2020 Cancer Staging   Staging form: Lung, AJCC 8th Edition - Clinical stage from 11/13/2020: Stage IV (cTX, cN3, cM1) - Signed by [name], MD on 11/16/2020 Stage prefix: Initial diagnosis   11/13/2020 - 11/28/2020 Radiation Therapy   palliative radiation to Medistinum with Dr Lisbeth Renshaw 11/13/20-11/28/20   11/16/2020 Initial Diagnosis   Small cell lung cancer ([place])   11/22/2020 -  Chemotherapy   First line etoposide D1-3, carboplatin and atezolizumab D1 q3weeks beginning 11/22/20.           CURRENT THERAPY:  -palliative radiation to Mediastinum with Dr Lisbeth Renshaw 11/13/20-11/28/20 -First line etoposide D1-3, carboplatin and atezolizumab D1 q3weeks beginning 11/22/20.  INTERVAL HISTORY:  Tara Rodgers is here for a follow up. She presents to the clinic with her husband. She notes having significant lower back pain that radiates up her right back. She notes she has had this pain for some time, but this is getting worse lately. She notes weakness in her knees from the pain. She notes she has tried to eat and drink but not going down easily. She has been taking Hydrocodone TID. She notes she tries to take Ensure. I reviewed her medication list with her as she struggles to swallow some of her pills lately. She could barely swallow her Thyroid medication.     REVIEW OF SYSTEMS:   Constitutional: Denies fevers, chills or abnormal weight loss Eyes: Denies blurriness of vision  Ears, nose, mouth, throat, and face: Denies mucositis or sore throat Respiratory: Denies cough, dyspnea or wheezes (+) On supplemental oxygen  Cardiovascular: Denies palpitation, chest discomfort or lower extremity  swelling Gastrointestinal:  Denies nausea, heartburn or change in bowel habits Skin: Denies abnormal skin rashes MSK: (+) Lower back pain, worsening  Lymphatics: Denies new lymphadenopathy or easy bruising Neurological:Denies numbness, tingling or new weaknesses Behavioral/Psych: Mood is stable, no new changes  All other systems were reviewed with the patient and are negative.  MEDICAL HISTORY:  Past Medical History:  Diagnosis Date  . Anxiety   . Asthma    does not take any medications, hx of inhaler use  . Breast cancer [facility]) 1995/2016   right sided breast cancer in 08/1994  . Bronchitis    hx of  . GERD (gastroesophageal reflux disease)   . History of hiatal hernia   . Hypertension   . Hyperthyroidism   . Lung cancer ([place]) 11/02/2020    SURGICAL HISTORY: Past Surgical History:  Procedure Laterality Date  . BREAST BIOPSY Right 2016   malignant  . BREAST EXCISIONAL BIOPSY Left 1996   benign  . Carpel Tunnel  Bilateral   . INCONTINENCE SURGERY     with mesh insertion about 7 years ago  . LATISSIMUS FLAP TO BREAST Right 03/23/2015   Procedure: LATISSIMUS FLAP TO BREAST WITH PLACEMENT OF IMPLANT FOR BREAST RECONSTRUCTION;  Surgeon: [name], MD;  Location: [facility];  Service: Plastics;  Laterality: Right;  . MASTECTOMY Right 03/23/2015  . RECONSTRUCTION BREAST W/ LATISSIMUS DORSI FLAP Right 03/23/2015  . TONSILLECTOMY     as a child  . TOTAL KNEE ARTHROPLASTY Right 03/13/2018   Procedure: RIGHT TOTAL KNEE ARTHROPLASTY;  Surgeon: Sydnee Cabal, MD;  Location: [facility];  Service: Orthopedics;  Laterality: Right;  Adductor Block  . TOTAL MASTECTOMY Right 03/23/2015   Procedure: RIGHT TOTAL MASTECTOMY;  Surgeon: [name], MD;  Location: [facility];  Service: General;  Laterality: Right;    I have reviewed the social history and family history with the patient and they are unchanged from previous note.  ALLERGIES:  is allergic to fosamax [alendronate  sodium].  MEDICATIONS:  Current Outpatient Medications  Medication Sig Dispense Refill  . HYDROcodone-acetaminophen (HYCET) 7.5-325 mg/15 ml solution Take 10-15 mLs by mouth every 4 (four) hours as needed for moderate pain or severe pain. 473 mL 0  . lidocaine (LIDODERM) 5 % Place 1 patch onto the skin daily. Remove & Discard patch within 12 hours or as directed by MD 30 patch 0  . allopurinol (ZYLOPRIM) 300 MG tablet Take 1 tablet (300 mg total) by mouth daily. 30 tablet 1  . ALPRAZolam (XANAX) 0.5 MG tablet Take 1 tablet by mouth twice daily as needed 180 tablet 0  . cyclobenzaprine (FLEXERIL) 10 MG tablet Take 1 tablet (10 mg total) by mouth 3 (three) times daily as needed for muscle spasms. 30 tablet 2  . esomeprazole (NEXIUM) 40 MG capsule One po daily at bedtime 90 capsule 1  . EUTHYROX 75 MCG tablet Take 1 tablet by mouth once daily 90 tablet 0  . HYDROcodone-homatropine (HYCODAN) 5-1.5 MG/5ML syrup Take 5 mLs by mouth every 8 (eight) hours as needed for cough. 120 mL 0  . letrozole (FEMARA) 2.5 MG tablet Take 1 tablet (2.5 mg total) by mouth daily. 90 tablet 3  . metoprolol succinate (TOPROL-XL) 50 MG 24 hr tablet Take 1 tablet by mouth twice daily 60 tablet 1  . ondansetron (ZOFRAN)  8 MG tablet Take 1 tablet (8 mg total) by mouth 2 (two) times daily as needed for refractory nausea / vomiting. Start on day 3 after carboplatin chemo. 30 tablet 1  . prochlorperazine (COMPAZINE) 10 MG tablet Take 1 tablet (10 mg total) by mouth every 6 (six) hours as needed (Nausea or vomiting). 30 tablet 1  . simvastatin (ZOCOR) 40 MG tablet TAKE 1 TABLET BY MOUTH AT BEDTIME 90 tablet 0  . sucralfate (CARAFATE) 1 g tablet Take 1 tablet (1 g total) by mouth 4 (four) times daily. Dissolve each tablet in 15 cc water before use. 120 tablet 2  . verapamil (CALAN-SR) 240 MG CR tablet Take 1 tablet by mouth once daily 90 tablet 0   No current facility-administered medications for this visit.    PHYSICAL  EXAMINATION: ECOG PERFORMANCE STATUS: 3 - Symptomatic, >50% confined to bed  Vitals:   11/27/20 1406  BP: (!) 147/98  Pulse: (!) 110  Resp: 17  Temp: 97.8 F (36.6 C)  SpO2: 99%   There were no vitals filed for this visit.  GENERAL:alert, no distress and comfortable SKIN: skin color, texture, turgor are normal, no rashes or significant lesions EYES: normal, Conjunctiva are pink and non-injected, sclera clear  NECK: supple, thyroid normal size, non-tender, without nodularity LYMPH:  no palpable lymphadenopathy in the cervical, axillary  LUNGS: clear to auscultation and percussion with normal breathing effort HEART: regular rate & rhythm and no murmurs (+) ;ower extremity edema ABDOMEN:abdomen soft, non-tender (+) bowel sounds Musculoskeletal:no cyanosis of digits and no clubbing  NEURO: alert & oriented x 3 with fluent speech, no focal motor/sensory deficits  LABORATORY DATA:  I have reviewed the data as listed CBC Latest Ref Rng & Units 11/27/2020 11/20/2020 11/16/2020  WBC 4.0 - 10.5 K/uL 5.1 6.8 9.8  Hemoglobin 12.0 - 15.0 g/dL 11.7(L) 11.5(L) 11.3(L)  Hematocrit 36.0 - 46.0 % 35.4(L) 35.9(L) 35.9(L)  Platelets 150 - 400 K/uL 328 482(H) 448(H)     CMP Latest Ref Rng & Units 11/27/2020 11/20/2020 11/17/2020  Glucose 70 - 99 mg/dL 94 87 94  BUN 8 - 23 mg/dL _0 Creatinine 0.44 - 1.00 mg/dL 0.77 0.87 1.09(H)  Sodium 135 - 145 mmol/L 135 138 137  Potassium 3.5 - 5.1 mmol/L 4.2 3.9 4.3  Chloride 98 - 111 mmol/L 102 103 103  CO2 22 - 32 mmol/L _1 Calcium 8.9 - 10.3 mg/dL 8.8(L) 8.8(L) 8.9  Total Protein 6.5 - 8.1 g/dL 6.9 6.9 -  Total Bilirubin 0.3 - 1.2 mg/dL 0.7 0.3 -  Alkaline Phos 38 - 126 U/L 101 113 -  AST 15 - 41 U/L 48(H) 40 -  ALT 0 - 44 U/L 59(H) 24 -      RADIOGRAPHIC STUDIES: I have personally reviewed the radiological images as listed and agreed with the findings in the report. No results found.   ASSESSMENT & PLAN:  HIRAL LUKASIEWICZ is a 75 y.o.  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 In the past 3 months she had progressive SOB and mild cough. She also has increasing right chest/flank pain.  -HerCT chest from 1/13/22showed numerous bilateral pulmonary nodules and masses, most prominently in the right middle lobe with dominant 4.1 cmmass withBulky mediastinal, thoracic inlet, right supraclavicular, and left axillary lymphadenopathy consistent with metastatic disease. Scan also shows16 mm  lesioninright liveralong withMultiple nodules in the upper abdomen concerningfor metastatic disease. There is also a13 mm hypoenhancing lesion in the tail of pancreas. -Her PET from 1/28/22confirmed diffuse metastatic disease -She has alargepalpable right supraclavicular lymph nodes. Biopsy of LN on 11/13/20 showedsmall cell lung cancer, TTF-1(+). Her cancer is stage IV and no longer curable but still treatable.  -Given her metastatic lung cancer, I recommend MRI brain given high risk for brain mets. Will proceed with scan on 12/01/20.  -I discussed the overall poor prognosis,given the aggressive nature of small cell lung cancer, and high disease burden. She is currently on supplemental oxygen 3L.  -I started her on first-line chemo with etoposide D1-3, carboplatin and atezolizumab D1 q3weeks beginning 11/27/20.  -Labs reviewed, Hg 11.7. Her main concern is her back pain. She also has dysphagia from Radiation and constipation. I reviewed management with her.  -She has [facility]. I discussed the option of palliative home care as well.  -F/u next week for symptom management.    2. Bone Metastasis, Back Pain and Mid Chest pain, Dysphagia  -She had chest pain from her mediastinal bone met as seen on 11/10/20 PET scan. She is currently on palliative radiation to  Mediastinum with Dr Lisbeth Renshaw 11/13/20-11/28/20.  -She has Dysphagia from recent Mediastinum RT. She struggles to keep food down. I recommend she increase Ensure to 2-3 bottles a day and eat smaller more frequent meals. I also recommend she increase water intake.  -She has had back pain that has worsened in the recent weeks, 10/10. She has L3 bone met as seen on PET as well. I will discuss target palliative Radiation for her back pain with Dr Lisbeth Renshaw.  -She can barely stand or walk due to worsened pain. She has walker at home as needed. I encouraged her to do some PT.  -For pain she has increased Hycet to TID. I will increase dose today and she can take q4-6hours (11/27/20). She can also use hating pad for her back pain.  3. Symptom Management: Constipation, LE edema  -She is more constipated lately. I discussed pain medication can lead to constipation. I recommend suppository and laxative to have BM daily.  -For her LE edema she can use compression sock and elevates her feet.   4. Recurrent right breast invasive ductal carcinoma, pT2N0, stage II, grade 3, ER positive, PR positive, HER-2 negative.  -She was initiallydiagnosed in 1996 with right breast cancer. She was treated with right breast lumpectomy, 6 months of chemo CMF and adjuvant radiation.  -She was diagnosed with cancer recurrence in 12/2014. She iss/prightmastectomy with completed resection.Oncotype showed low risk diseaseso I did not recommend adjuvantchemotherapy. -She started anti-estrogen therapy withletrozolein 04/2015. Plan fortotal of 7-10 years.Tolerating well.  5. Osteopenia -Shepreviouslyhad Osteoporosis. Shewasgetting Prolia at her PCP's office Every 6 months,she stopped due to high co-payafter about 2 years. She notes she did not tolerate oral Alendronate so she stopped after 5 years -Her DEXA from 9/30/19sheimprovedto osteopeniawithlowest T-Score of -2.2 at [facility] with fracture risk of 20% to spine and 6.4%  risk of hip fracture.Her 03/27/20 DEXA showed Osteopenia with -2.3 at right hip. -Shepreviouslydeclined bisphosphonate with Zometa injections. She has had at least 7 years of bisphosphonateand Proliain the past, which is adequate.  -continue calcium 1 g, vitamin D at least 1000 units daily, and weight-bearing exercise distress or bone.  6.Comorbidities:Arthritis,HTN, Tachycardia  -Shehad a right knee replacement in May 2019 -She continues to f/u with PCP   7. Social Support, Goal  of care discussion, DNR/DNI   -She notes her daughter and husband know she is here but not aware of her scan results and very supportive.  -I again offered her the chance to speak with our SW for counseling. She is open to this now. Will refer.  -She has home care with nurse aid and advanced home health. If not enough, may refer her to palliative home care.  -We again discussed the incurable nature of her cancer, and the overall poor prognosis, especially if she does not have good response to chemotherapy or progress on chemo -The patient understands the goal of care is palliative. -I recommend DNR/DNI, she agreed (11/27/20). I encourage her to have living will. She is not interested in feeding tube unless she really needs it.    PLAN -I refilled Hycet today with higher dose  -Send SW referral for counseling -Continue Palliative Radiation until 2/15 -I will discuss with Dr. Lisbeth Renshaw about palliative radiation to L3 lesion due to her worsening back pain  -MR Brain on 12/01/20  -PAC placement on 11/29/20 -Phone visit this Friday for pain management  -Plan to see her back next week with lab  -I signed her DNR order and gave her a copy today   No problem-specific Assessment & Plan notes found for this encounter.   No orders of the defined types were placed in this encounter.  All questions were answered. The patient knows to call the clinic with any problems, questions or concerns. No barriers to learning  was detected. The total time spent in the appointment was 40 minutes.     [name], MD 11/27/2020   I, Joslyn Devon, am acting as scribe for [name], MD.   I have reviewed the above documentation for accuracy and completeness, and I agree with the above.

## 2020-11-24 NOTE — Patient Instructions (Signed)
[place] Discharge Instructions for Patients Receiving Chemotherapy  Today you received the following chemotherapy agents Etoposide.  To help prevent nausea and vomiting after your treatment, we encourage you to take your nausea medication as directed.  If you develop nausea and vomiting that is not controlled by your nausea medication, call the clinic.   BELOW ARE SYMPTOMS THAT SHOULD BE REPORTED IMMEDIATELY:  *FEVER GREATER THAN 100.5 F  *CHILLS WITH OR WITHOUT FEVER  NAUSEA AND VOMITING THAT IS NOT CONTROLLED WITH YOUR NAUSEA MEDICATION  *UNUSUAL SHORTNESS OF BREATH  *UNUSUAL BRUISING OR BLEEDING  TENDERNESS IN MOUTH AND THROAT WITH OR WITHOUT PRESENCE OF ULCERS  *URINARY PROBLEMS  *BOWEL PROBLEMS  UNUSUAL RASH Items with * indicate a potential emergency and should be followed up as soon as possible.  Feel free to call the clinic should you have any questions or concerns. The clinic phone number is [phone].  Please show the [facility] at check-in to the Emergency Department and triage nurse.

## 2020-11-25 ENCOUNTER — Other Ambulatory Visit: Payer: Self-pay | Admitting: Cardiovascular Disease

## 2020-11-25 DIAGNOSIS — C3491 Malignant neoplasm of unspecified part of right bronchus or lung: Secondary | ICD-10-CM | POA: Diagnosis not present

## 2020-11-25 DIAGNOSIS — C7889 Secondary malignant neoplasm of other digestive organs: Secondary | ICD-10-CM | POA: Diagnosis not present

## 2020-11-25 DIAGNOSIS — C771 Secondary and unspecified malignant neoplasm of intrathoracic lymph nodes: Secondary | ICD-10-CM | POA: Diagnosis not present

## 2020-11-25 DIAGNOSIS — Z9181 History of falling: Secondary | ICD-10-CM | POA: Diagnosis not present

## 2020-11-25 DIAGNOSIS — K219 Gastro-esophageal reflux disease without esophagitis: Secondary | ICD-10-CM | POA: Diagnosis not present

## 2020-11-25 DIAGNOSIS — T50915D Adverse effect of multiple unspecified drugs, medicaments and biological substances, subsequent encounter: Secondary | ICD-10-CM | POA: Diagnosis not present

## 2020-11-25 DIAGNOSIS — M8588 Other specified disorders of bone density and structure, other site: Secondary | ICD-10-CM | POA: Diagnosis not present

## 2020-11-25 DIAGNOSIS — C787 Secondary malignant neoplasm of liver and intrahepatic bile duct: Secondary | ICD-10-CM | POA: Diagnosis not present

## 2020-11-25 DIAGNOSIS — I10 Essential (primary) hypertension: Secondary | ICD-10-CM | POA: Diagnosis not present

## 2020-11-25 DIAGNOSIS — Z9981 Dependence on supplemental oxygen: Secondary | ICD-10-CM | POA: Diagnosis not present

## 2020-11-25 DIAGNOSIS — J45909 Unspecified asthma, uncomplicated: Secondary | ICD-10-CM | POA: Diagnosis not present

## 2020-11-25 DIAGNOSIS — Z853 Personal history of malignant neoplasm of breast: Secondary | ICD-10-CM | POA: Diagnosis not present

## 2020-11-25 DIAGNOSIS — Z9011 Acquired absence of right breast and nipple: Secondary | ICD-10-CM | POA: Diagnosis not present

## 2020-11-25 DIAGNOSIS — E039 Hypothyroidism, unspecified: Secondary | ICD-10-CM | POA: Diagnosis not present

## 2020-11-25 DIAGNOSIS — K5903 Drug induced constipation: Secondary | ICD-10-CM | POA: Diagnosis not present

## 2020-11-25 DIAGNOSIS — F419 Anxiety disorder, unspecified: Secondary | ICD-10-CM | POA: Diagnosis not present

## 2020-11-27 ENCOUNTER — Other Ambulatory Visit: Payer: Self-pay

## 2020-11-27 ENCOUNTER — Encounter: Payer: Self-pay | Admitting: Hematology

## 2020-11-27 ENCOUNTER — Ambulatory Visit: Payer: Medicare Other | Admitting: Radiation Oncology

## 2020-11-27 ENCOUNTER — Inpatient Hospital Stay: Payer: Medicare Other

## 2020-11-27 ENCOUNTER — Telehealth: Payer: Self-pay | Admitting: Hematology

## 2020-11-27 ENCOUNTER — Inpatient Hospital Stay: Payer: Medicare Other | Admitting: Hematology

## 2020-11-27 VITALS — BP 147/98 | HR 110 | Temp 97.8°F | Resp 17 | Ht 59.0 in

## 2020-11-27 DIAGNOSIS — Z7189 Other specified counseling: Secondary | ICD-10-CM

## 2020-11-27 DIAGNOSIS — C3490 Malignant neoplasm of unspecified part of unspecified bronchus or lung: Secondary | ICD-10-CM | POA: Diagnosis not present

## 2020-11-27 DIAGNOSIS — M8580 Other specified disorders of bone density and structure, unspecified site: Secondary | ICD-10-CM | POA: Diagnosis not present

## 2020-11-27 DIAGNOSIS — Z79811 Long term (current) use of aromatase inhibitors: Secondary | ICD-10-CM | POA: Diagnosis not present

## 2020-11-27 DIAGNOSIS — Z170 Estrogen receptor positive status [ER+]: Secondary | ICD-10-CM | POA: Diagnosis not present

## 2020-11-27 DIAGNOSIS — C50411 Malignant neoplasm of upper-outer quadrant of right female breast: Secondary | ICD-10-CM | POA: Diagnosis not present

## 2020-11-27 DIAGNOSIS — C7800 Secondary malignant neoplasm of unspecified lung: Secondary | ICD-10-CM | POA: Diagnosis not present

## 2020-11-27 DIAGNOSIS — I10 Essential (primary) hypertension: Secondary | ICD-10-CM | POA: Diagnosis not present

## 2020-11-27 DIAGNOSIS — Z923 Personal history of irradiation: Secondary | ICD-10-CM | POA: Diagnosis not present

## 2020-11-27 DIAGNOSIS — Z853 Personal history of malignant neoplasm of breast: Secondary | ICD-10-CM | POA: Diagnosis not present

## 2020-11-27 DIAGNOSIS — R000 Tachycardia, unspecified: Secondary | ICD-10-CM | POA: Diagnosis not present

## 2020-11-27 DIAGNOSIS — M1990 Unspecified osteoarthritis, unspecified site: Secondary | ICD-10-CM | POA: Diagnosis not present

## 2020-11-27 DIAGNOSIS — Z5111 Encounter for antineoplastic chemotherapy: Secondary | ICD-10-CM | POA: Diagnosis not present

## 2020-11-27 DIAGNOSIS — C787 Secondary malignant neoplasm of liver and intrahepatic bile duct: Secondary | ICD-10-CM | POA: Diagnosis not present

## 2020-11-27 DIAGNOSIS — C50911 Malignant neoplasm of unspecified site of right female breast: Secondary | ICD-10-CM

## 2020-11-27 LAB — CMP (CANCER CENTER ONLY)
ALT: 59 U/L — ABNORMAL HIGH (ref 0–44)
AST: 48 U/L — ABNORMAL HIGH (ref 15–41)
Albumin: 3.3 g/dL — ABNORMAL LOW (ref 3.5–5.0)
Alkaline Phosphatase: 101 U/L (ref 38–126)
Anion gap: 10 (ref 5–15)
BUN: 14 mg/dL (ref 8–23)
CO2: 23 mmol/L (ref 22–32)
Calcium: 8.8 mg/dL — ABNORMAL LOW (ref 8.9–10.3)
Chloride: 102 mmol/L (ref 98–111)
Creatinine: 0.77 mg/dL (ref 0.44–1.00)
GFR, Estimated: >60 mL/min (ref >60)
Glucose, Bld: 94 mg/dL (ref 70–99)
Potassium: 4.2 mmol/L (ref 3.5–5.1)
Sodium: 135 mmol/L (ref 135–145)
Total Bilirubin: 0.7 mg/dL (ref 0.3–1.2)
Total Protein: 6.9 g/dL (ref 6.5–8.1)

## 2020-11-27 LAB — CBC WITH DIFFERENTIAL (CANCER CENTER ONLY)
Abs Immature Granulocytes: 0.12 10*3/uL — ABNORMAL HIGH (ref 0.00–0.07)
Basophils Absolute: 0 10*3/uL (ref 0.0–0.1)
Basophils Relative: 0 %
Eosinophils Absolute: 0 10*3/uL (ref 0.0–0.5)
Eosinophils Relative: 0 %
HCT: 35.4 % — ABNORMAL LOW (ref 36.0–46.0)
Hemoglobin: 11.7 g/dL — ABNORMAL LOW (ref 12.0–15.0)
Immature Granulocytes: 2 %
Lymphocytes Relative: 4 %
Lymphs Abs: 0.2 10*3/uL — ABNORMAL LOW (ref 0.7–4.0)
MCH: 27.9 pg (ref 26.0–34.0)
MCHC: 33.1 g/dL (ref 30.0–36.0)
MCV: 84.5 fL (ref 80.0–100.0)
Monocytes Absolute: 0 10*3/uL — ABNORMAL LOW (ref 0.1–1.0)
Monocytes Relative: 0 %
Neutro Abs: 4.7 10*3/uL (ref 1.7–7.7)
Neutrophils Relative %: 94 %
Platelet Count: 328 10*3/uL (ref 150–400)
RBC: 4.19 MIL/uL (ref 3.87–5.11)
RDW: 13.6 % (ref 11.5–15.5)
WBC Count: 5.1 10*3/uL (ref 4.0–10.5)
nRBC: 0 % (ref 0.0–0.2)

## 2020-11-27 LAB — TSH: TSH: 15.448 u[IU]/mL — ABNORMAL HIGH (ref 0.308–3.960)

## 2020-11-27 LAB — T4, FREE: Free T4: 0.77 ng/dL (ref 0.61–1.12)

## 2020-11-27 MED ORDER — LIDOCAINE 5 % EX PTCH: 1.0000 | MEDICATED_PATCH | CUTANEOUS | 0 refills | Status: AC

## 2020-11-27 MED ORDER — HYDROCODONE-ACETAMINOPHEN 7.5-325 MG/15ML PO SOLN: 10.0000 mL | ORAL | 0 refills | Status: DC | PRN

## 2020-11-27 NOTE — Progress Notes (Signed)
Thank you for seeing her. Agree with Palliative Care. Do you want [place] to make referral?

## 2020-11-27 NOTE — Telephone Encounter (Signed)
Left message with follow-up appointment per 2/14 los. Gave option to call back to reschedule if needed.

## 2020-11-28 ENCOUNTER — Other Ambulatory Visit: Payer: Self-pay

## 2020-11-28 ENCOUNTER — Encounter: Payer: Self-pay | Admitting: Radiation Oncology

## 2020-11-28 ENCOUNTER — Other Ambulatory Visit: Payer: Self-pay | Admitting: Student

## 2020-11-28 ENCOUNTER — Telehealth: Payer: Self-pay

## 2020-11-28 ENCOUNTER — Ambulatory Visit
Admission: RE | Admit: 2020-11-28 | Discharge: 2020-11-28 | Disposition: A | Discharge status: Home / Self Care | Payer: Medicare Other | Source: Ambulatory Visit | Attending: Radiation Oncology | Admitting: Radiation Oncology

## 2020-11-28 ENCOUNTER — Encounter (HOSPITAL_COMMUNITY): Payer: Self-pay | Admitting: Emergency Medicine

## 2020-11-28 ENCOUNTER — Emergency Department (HOSPITAL_COMMUNITY)
Admission: EM | Admit: 2020-11-28 | Discharge: 2020-11-28 | Disposition: A | Discharge status: Home / Self Care | Payer: Medicare Other | Source: Home / Self Care | Attending: Emergency Medicine | Admitting: Emergency Medicine

## 2020-11-28 ENCOUNTER — Emergency Department (HOSPITAL_COMMUNITY): Payer: Medicare Other

## 2020-11-28 DIAGNOSIS — Z853 Personal history of malignant neoplasm of breast: Secondary | ICD-10-CM | POA: Insufficient documentation

## 2020-11-28 DIAGNOSIS — I10 Essential (primary) hypertension: Secondary | ICD-10-CM | POA: Insufficient documentation

## 2020-11-28 DIAGNOSIS — C50911 Malignant neoplasm of unspecified site of right female breast: Secondary | ICD-10-CM | POA: Diagnosis not present

## 2020-11-28 DIAGNOSIS — R109 Unspecified abdominal pain: Secondary | ICD-10-CM | POA: Diagnosis not present

## 2020-11-28 DIAGNOSIS — Z85118 Personal history of other malignant neoplasm of bronchus and lung: Secondary | ICD-10-CM | POA: Insufficient documentation

## 2020-11-28 DIAGNOSIS — Z96651 Presence of right artificial knee joint: Secondary | ICD-10-CM | POA: Insufficient documentation

## 2020-11-28 DIAGNOSIS — E039 Hypothyroidism, unspecified: Secondary | ICD-10-CM | POA: Insufficient documentation

## 2020-11-28 DIAGNOSIS — J45909 Unspecified asthma, uncomplicated: Secondary | ICD-10-CM | POA: Insufficient documentation

## 2020-11-28 DIAGNOSIS — R1084 Generalized abdominal pain: Secondary | ICD-10-CM | POA: Insufficient documentation

## 2020-11-28 DIAGNOSIS — C781 Secondary malignant neoplasm of mediastinum: Secondary | ICD-10-CM | POA: Diagnosis not present

## 2020-11-28 DIAGNOSIS — R000 Tachycardia, unspecified: Secondary | ICD-10-CM | POA: Insufficient documentation

## 2020-11-28 DIAGNOSIS — C771 Secondary and unspecified malignant neoplasm of intrathoracic lymph nodes: Secondary | ICD-10-CM | POA: Diagnosis not present

## 2020-11-28 DIAGNOSIS — R112 Nausea with vomiting, unspecified: Secondary | ICD-10-CM | POA: Insufficient documentation

## 2020-11-28 DIAGNOSIS — Z79899 Other long term (current) drug therapy: Secondary | ICD-10-CM | POA: Insufficient documentation

## 2020-11-28 LAB — URINALYSIS, ROUTINE W REFLEX MICROSCOPIC
Bilirubin Urine: NEGATIVE
Glucose, UA: NEGATIVE mg/dL
Hgb urine dipstick: NEGATIVE
Ketones, ur: NEGATIVE mg/dL
Nitrite: NEGATIVE
Protein, ur: NEGATIVE mg/dL
Specific Gravity, Urine: 1.012 (ref 1.005–1.030)
pH: 7 (ref 5.0–8.0)

## 2020-11-28 LAB — COMPREHENSIVE METABOLIC PANEL
ALT: 74 U/L — ABNORMAL HIGH (ref 0–44)
AST: 53 U/L — ABNORMAL HIGH (ref 15–41)
Albumin: 3.6 g/dL (ref 3.5–5.0)
Alkaline Phosphatase: 95 U/L (ref 38–126)
Anion gap: 11 (ref 5–15)
BUN: 15 mg/dL (ref 8–23)
CO2: 23 mmol/L (ref 22–32)
Calcium: 8.8 mg/dL — ABNORMAL LOW (ref 8.9–10.3)
Chloride: 101 mmol/L (ref 98–111)
Creatinine, Ser: 0.78 mg/dL (ref 0.44–1.00)
GFR, Estimated: >60 mL/min (ref >60)
Glucose, Bld: 100 mg/dL — ABNORMAL HIGH (ref 70–99)
Potassium: 4 mmol/L (ref 3.5–5.1)
Sodium: 135 mmol/L (ref 135–145)
Total Bilirubin: 0.7 mg/dL (ref 0.3–1.2)
Total Protein: 6.9 g/dL (ref 6.5–8.1)

## 2020-11-28 LAB — CBC WITH DIFFERENTIAL/PLATELET
Abs Immature Granulocytes: 0.29 10*3/uL — ABNORMAL HIGH (ref 0.00–0.07)
Basophils Absolute: 0.1 10*3/uL (ref 0.0–0.1)
Basophils Relative: 1 %
Eosinophils Absolute: 0 10*3/uL (ref 0.0–0.5)
Eosinophils Relative: 1 %
HCT: 37.1 % (ref 36.0–46.0)
Hemoglobin: 11.8 g/dL — ABNORMAL LOW (ref 12.0–15.0)
Immature Granulocytes: 7 %
Lymphocytes Relative: 6 %
Lymphs Abs: 0.3 10*3/uL — ABNORMAL LOW (ref 0.7–4.0)
MCH: 27.5 pg (ref 26.0–34.0)
MCHC: 31.8 g/dL (ref 30.0–36.0)
MCV: 86.5 fL (ref 80.0–100.0)
Monocytes Absolute: 0 10*3/uL — ABNORMAL LOW (ref 0.1–1.0)
Monocytes Relative: 1 %
Neutro Abs: 3.6 10*3/uL (ref 1.7–7.7)
Neutrophils Relative %: 84 %
Platelets: 293 10*3/uL (ref 150–400)
RBC: 4.29 MIL/uL (ref 3.87–5.11)
RDW: 13.7 % (ref 11.5–15.5)
WBC: 4.3 10*3/uL (ref 4.0–10.5)
nRBC: 0 % (ref 0.0–0.2)

## 2020-11-28 LAB — LIPASE, BLOOD: Lipase: 47 U/L (ref 11–51)

## 2020-11-28 MED ORDER — SENNOSIDES-DOCUSATE SODIUM 8.6-50 MG PO TABS: 1.0000 | ORAL_TABLET | Freq: Every day | ORAL | 0 refills | Status: AC

## 2020-11-28 MED ORDER — LIDOCAINE 5 % EX PTCH
1.0000 | MEDICATED_PATCH | CUTANEOUS | Status: DC
  Administered 2020-11-28: 1 via TRANSDERMAL
  Filled 2020-11-28: qty 1

## 2020-11-28 MED ORDER — SODIUM CHLORIDE 0.9 % IV SOLN: INTRAVENOUS | Status: DC

## 2020-11-28 MED ORDER — ONDANSETRON HCL 4 MG/2ML IJ SOLN
4.0000 mg | Freq: Once | INTRAMUSCULAR | Status: AC
  Administered 2020-11-28: 4 mg via INTRAVENOUS
  Filled 2020-11-28: qty 2

## 2020-11-28 MED ORDER — MORPHINE SULFATE (PF) 4 MG/ML IV SOLN
4.0000 mg | Freq: Once | INTRAVENOUS | Status: AC
Start: 2020-11-28 — End: 2020-11-28
  Administered 2020-11-28: 4 mg via INTRAVENOUS
  Filled 2020-11-28: qty 1

## 2020-11-28 MED ORDER — MORPHINE SULFATE (PF) 4 MG/ML IV SOLN
4.0000 mg | Freq: Once | INTRAVENOUS | Status: AC
  Administered 2020-11-28: 4 mg via INTRAVENOUS
  Filled 2020-11-28: qty 1

## 2020-11-28 MED ORDER — IOHEXOL 300 MG/ML  SOLN
100.0000 mL | Freq: Once | INTRAMUSCULAR | Status: AC | PRN
  Administered 2020-11-28: 100 mL via INTRAVENOUS

## 2020-11-28 NOTE — ED Triage Notes (Signed)
Pt arrived from the [facility]. Pt has small cell lung cancer and was getting radiation today. Pt had back pain yesterday, and it has worsened today and now she is complaining of abdominal pain. Pt is constipated and has had no relief with suppositories. Pt has oxygen for home use but only uses it when it is needed; pt is not normally on oxygen. Pt took hydrocodone at 12pm with no relief.

## 2020-11-28 NOTE — Progress Notes (Signed)
The patient is undergoing palliative radiation treatment to the right lung and right supraclavicular region.  The patient has been having some back pain and I was going to discuss this with her today, with her PET scan showing some significant activity in the mid lumbar spine.  However, when I went to see the patient, the staff indicated that she was writhing in severe pain on the treatment table, which made it somewhat difficult for her to complete her treatment today.  When seen, the patient is moaning in the wheelchair, clutching her abdomen, and describes increasing severe acute abdominal pain over the last couple of days.  I discussed having the patient present to the emergency room for further work-up and the patient and her husband agree.  She has 1 additional radiation treatment to the chest and I will discuss potential radiation treatment to the lumbar spine at a later date.  ------------------------------------------------  Jodelle Gross, MD, PhD

## 2020-11-28 NOTE — ED Provider Notes (Signed)
Tara Rodgers Provider Note   CSN: [id number] Arrival date & time: 11/28/20  1505     History Chief Complaint  Patient presents with  . Abdominal Pain  . Back Pain    Hx of small cell lung cancer.    Tara Rodgers is a 75 y.o. female.  HPI     Patient presents from cancer center with concern for ongoing abdominal pain, nausea, vomiting.  She is here with her husband who assists with the history, additional details obtained on chart review from cancer center. Patient notes that she has had episodic constipation and abdominal pain during her cancer therapy. 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 Today, after completing her radiation therapy session she was sent here due to ongoing abdominal pain, which she describes as lower, severe, not improved with home narcotics.  There is associated nausea, vomiting, last bowel movement was possibly several days ago.  She did try one attempt at relief with a suppository 2 days ago, but had no change in her condition.   Past Medical History:  Diagnosis Date  . Anxiety   . Asthma    does not take any medications, hx of inhaler use  . Breast cancer [facility]) 1995/2016   right sided breast cancer in 08/1994  . Bronchitis    hx of  . GERD (gastroesophageal reflux disease)   . History of hiatal hernia   . Hypertension   . Hyperthyroidism   . Lung cancer ([place]) 11/02/2020    Patient Active Problem List   Diagnosis Date Noted  . Small cell lung cancer ([place]) 11/16/2020  . Goals of care, counseling/discussion 11/16/2020  . Sinus tachycardia 11/16/2019  . Left carotid bruit 11/16/2019  . Stiffness of right knee 04/01/2018  . Osteoarthritis of knee 03/13/2018  . History of total knee arthroplasty 03/13/2018  . Genetic testing 02/13/2015  . Abnormal chromosomal and genetic finding on antenatal screening mother 02/13/2015  . 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 Hypertensive disorder 09/19/2012  . History of malignant neoplasm of breast 09/19/2012  . Asthma 09/19/2012  . Allergic rhinitis 09/19/2012  . Gastroesophageal reflux disease 09/19/2012  . Anxiety 09/19/2012  . Hyperlipidemia 09/19/2012  . Hypothyroidism 09/19/2012  . Osteoporosis 09/19/2012    Past Surgical History:  Procedure Laterality Date  . BREAST BIOPSY Right 2016   malignant  . BREAST EXCISIONAL BIOPSY Left 1996   benign  . Carpel Tunnel  Bilateral   . INCONTINENCE SURGERY     with mesh insertion about 7 years ago  . LATISSIMUS FLAP TO BREAST Right 03/23/2015   Procedure: LATISSIMUS FLAP TO BREAST WITH PLACEMENT OF IMPLANT FOR BREAST RECONSTRUCTION;  Surgeon: [name], MD;  Location: [place];  Service: Plastics;  Laterality: Right;  . MASTECTOMY Right 03/23/2015  . RECONSTRUCTION BREAST W/ LATISSIMUS DORSI FLAP Right 03/23/2015  . TONSILLECTOMY     as a child  . TOTAL KNEE ARTHROPLASTY Right 03/13/2018   Procedure: RIGHT TOTAL KNEE ARTHROPLASTY;  Surgeon: Sydnee Cabal, MD;  Location: [facility];  Service: Orthopedics;  Laterality: Right;  Adductor Block  . TOTAL MASTECTOMY Right 03/23/2015   Procedure: RIGHT TOTAL MASTECTOMY;  Surgeon: [name], MD;  Location: [place];  Service: General;  Laterality: Right;     OB History   No obstetric history on file.     Family History  Problem Relation Age of Onset  . COPD Father   . Heart  disease Father     Social History   Tobacco Use  . Smoking status: Never Smoker  . Smokeless tobacco: Never Used  Substance Use Topics  . Alcohol use: Yes    Comment: occasional  . Drug use: No    Home Medications Prior to Admission medications   Medication Sig Start Date End Date Taking? Authorizing Provider  allopurinol (ZYLOPRIM) 300 MG tablet Take 1 tablet (300 mg total) by mouth daily. 11/17/20  Yes [name], MD  ALPRAZolam Duanne Moron) 0.5 MG tablet Take 1 tablet by mouth twice daily as needed Patient  taking differently: Take 0.5 mg by mouth 2 (two) times daily as needed for anxiety. 09/11/20  Yes [name], [name] [name], MD  cyclobenzaprine (FLEXERIL) 10 MG tablet Take 1 tablet (10 mg total) by mouth 3 (three) times daily as needed for muscle spasms. 10/18/19  Yes [name], [name] [name], MD  esomeprazole (NEXIUM) 40 MG capsule One po daily at bedtime Patient taking differently: Take 40 mg by mouth daily. 08/24/20  Yes BaxleyCresenciano [name], MD  EUTHYROX 75 MCG tablet Take 1 tablet by mouth once daily Patient taking differently: Take 75 mcg by mouth daily before breakfast. 11/19/20  Yes [name], [name] [name], MD  HYDROcodone-acetaminophen (HYCET) 7.5-325 mg/15 ml solution Take 10-15 mLs by mouth every 4 (four) hours as needed for moderate pain or severe pain. 11/27/20  Yes [name], MD  letrozole [facility]) 2.5 MG tablet Take 1 tablet (2.5 mg total) by mouth daily. 03/30/20  Yes [name], MD  meloxicam (MOBIC) 7.5 MG tablet Take 7.5 mg by mouth 2 (two) times daily as needed for pain.   Yes [provider]  metoprolol succinate (TOPROL-XL) 50 MG 24 hr tablet Take 1 tablet by mouth twice daily Patient taking differently: Take 50 mg by mouth 2 (two) times daily. 11/20/20  Yes Lorretta Harp, MD  verapamil (CALAN-SR) 240 MG CR tablet Take 1 tablet by mouth once daily Patient taking differently: Take 240 mg by mouth daily. 09/11/20  Yes [name], [name] [name], MD  HYDROcodone-homatropine [facility]) 5-1.5 MG/5ML syrup Take 5 mLs by mouth every 8 (eight) hours as needed for cough. Patient not taking: No sig reported 10/24/20   Elby Showers, MD  lidocaine (LIDODERM) 5 % Place 1 patch onto the skin daily. Remove & Discard patch within 12 hours or as directed by MD 11/27/20   [name], MD  ondansetron (ZOFRAN) 8 MG tablet Take 1 tablet (8 mg total) by mouth 2 (two) times daily as needed for refractory nausea / vomiting. Start on day 3 after carboplatin chemo. 11/16/20   [name], MD  prochlorperazine (COMPAZINE) 10 MG tablet Take 1 tablet  (10 mg total) by mouth every 6 (six) hours as needed (Nausea or vomiting). Patient not taking: No sig reported 11/16/20   [name], MD  simvastatin (ZOCOR) 40 MG tablet TAKE 1 TABLET BY MOUTH AT BEDTIME Patient taking differently: Take by mouth daily at 6 (six) AM. 10/03/20   [name], [name] [name], MD  sucralfate (CARAFATE) 1 g tablet Take 1 tablet (1 g total) by mouth 4 (four) times daily. Dissolve each tablet in 15 cc water before use. Patient not taking: No sig reported 11/17/20   Kyung Rudd, MD    Allergies    Fosamax [alendronate sodium]  Review of Systems   Review of Systems  Constitutional:       Per HPI, otherwise negative  HENT:       Per HPI, otherwise negative  Respiratory:       Per HPI, otherwise negative  Cardiovascular:       Per HPI, otherwise negative  Gastrointestinal: Positive for abdominal pain, nausea and vomiting.  Endocrine:       Negative aside from HPI  Genitourinary:       Neg aside from HPI   Musculoskeletal:       Per HPI, otherwise negative  Skin: Negative.   Neurological: Negative for syncope.    Physical Exam Updated Vital Signs BP 119/73   Pulse (!) 110   Temp (!) 97.5 F (36.4 C) (Oral)   Resp 13   Ht 4\' 11"  (1.499 m)   Wt 60.7 kg   SpO2 98%   BMI 27.03 kg/m   Physical Exam Vitals and nursing note reviewed.  Constitutional:      General: She is not in acute distress.    Appearance: She is well-developed and well-nourished. She is ill-appearing.  HENT:     Head: Normocephalic and atraumatic.  Eyes:     Extraocular Movements: EOM normal.     Conjunctiva/sclera: Conjunctivae normal.  Cardiovascular:     Rate and Rhythm: Regular rhythm. Tachycardia present.  Pulmonary:     Effort: Pulmonary effort is normal. No respiratory distress.     Breath sounds: Normal breath sounds. No stridor.  Abdominal:     General: There is no distension.     Tenderness: There is abdominal tenderness. There is guarding.  Musculoskeletal:        General: No  edema.  Skin:    General: Skin is warm and dry.  Neurological:     Mental Status: She is alert and oriented to person, place, and time.     Cranial Nerves: No cranial nerve deficit.  Psychiatric:        Mood and Affect: Mood and affect normal.     ED Results / Procedures / Treatments   Labs (all labs ordered are listed, but only abnormal results are displayed) Labs Reviewed  COMPREHENSIVE METABOLIC PANEL - Abnormal; Notable for the following components:      Result Value   Glucose, Bld 100 (*)    Calcium 8.8 (*)    AST 53 (*)    ALT 74 (*)    All other components within normal limits  CBC WITH DIFFERENTIAL/PLATELET - Abnormal; Notable for the following components:   Hemoglobin 11.8 (*)    All other components within normal limits  URINALYSIS, ROUTINE W REFLEX MICROSCOPIC - Abnormal; Notable for the following components:   APPearance HAZY (*)    Leukocytes,Ua MODERATE (*)    Bacteria, UA FEW (*)    All other components within normal limits  LIPASE, BLOOD    EKG None  Radiology CT ABDOMEN PELVIS W CONTRAST  Result Date: 11/28/2020 CLINICAL DATA:  Metastatic breast cancer. Abdominal pain for 3 days. EXAM: CT ABDOMEN AND PELVIS WITH CONTRAST TECHNIQUE: Multidetector CT imaging of the abdomen and pelvis was performed using the standard protocol following bolus administration of intravenous contrast. CONTRAST:  180mL OMNIPAQUE IOHEXOL 300 MG/ML  SOLN COMPARISON:  PET-CT scan 11/10/2020 FINDINGS: Lower chest: Bilateral pleural effusions again demonstrated. Near complete atelectasis of the LEFT lower lobe similar to prior. Lung mass along the pleural surface of the RIGHT middle lobe measures 4.2 cm, no change from the FDG PET scan. Limited view of the mediastinum demonstrates a necrotic lymph node in the substernal space measuring 4.1 cm which is hypermetabolic on comparison PET-CT scan Hepatobiliary: Solitary hepatic metastasis  in the RIGHT hepatic lobe measures 1.4 cm. No biliary  duct dilatation. Gallbladder appears normal. Pancreas: Pancreas is normal. No ductal dilatation. No pancreatic inflammation. Spleen: Normal spleen Adrenals/urinary tract: Adrenal glands and kidneys are normal. The ureters and bladder normal. Stomach/Bowel: Stomach and duodenum normal. No dilated loops of small bowel. No evidence of bowel inflammation. Appendix not identified but no secondary signs appendicitis. Moderate volume stool in the ascending colon. The descending colon is collapsed. Moderate volume stool ball in the rectum measures 5.8 cm. Vascular/Lymphatic: Abdominal aorta is normal caliber with atherosclerotic calcification. There is no retroperitoneal or periportal lymphadenopathy. No pelvic lymphadenopathy. Reproductive: Post hysterectomy.  Adnexa unremarkable Other: No ascites.  Intraperitoneal free air. Musculoskeletal: Severe degenerative change of the LEFT hip joint. Multiple skeletal metastasis seen on comparison FDG PET scan are not well appreciated. There are several sclerotic lesions in the lumbar spine. There is extension beyond the vertebral body into the LEFT paraspinal musculature at the L2-L3 vertebral body level (lesion measures 2 cm on image 34/2). IMPRESSION: 1. Again demonstrate multifocal metastasis including RIGHT lung mass, large necrotic mediastinal lymph node and hepatic metastasis. 2. No evidence of bowel obstruction. Large volume stool in the ascending colon rectum could indicate constipation. 3. Bilateral moderate effusions and dense atelectasis of the LEFT lower lobe, similar prior. Sclerotic skeletal metastasis in the lumbar spine. 4. Extension of skeletal metastasis at L3/L2 into the LEFT paraspinal musculature. This may be a source of pain. Electronically Signed   By: Suzy Bouchard M.D.   On: 11/28/2020 19:09     Medications Ordered in ED Medications  0.9 %  sodium chloride infusion ( Intravenous New Bag/Given 11/28/20 1646)  lidocaine (LIDODERM) 5 % 1 patch (has no  administration in time range)  ondansetron (ZOFRAN) injection 4 mg (4 mg Intravenous Given 11/28/20 1646)  morphine 4 MG/ML injection 4 mg (4 mg Intravenous Given 11/28/20 1647)  iohexol (OMNIPAQUE) 300 MG/ML solution 100 mL (100 mLs Intravenous Contrast Given 11/28/20 1801)  morphine 4 MG/ML injection 4 mg (4 mg Intravenous Given 11/28/20 1829)    ED Course  I have reviewed the triage vital signs and the nursing notes.  Pertinent labs & imaging results that were available during my care of the patient were reviewed by me and considered in my medical decision making (see chart for details).  On repeat exam the patient is awake, alert, sitting upright. Cardiac monitor she has sinus tachycardia, rate 105/110, otherwise unremarkable Pulse oximetry 99% room air normal She, her husband and I have discussed tonight's findings including CT result which I have reviewed. CT reassuring in terms of acute GI pathology, no evidence for obstruction, some evidence for constipation. However, the patient is found to have likely progression of her disease into skeletal muscles, probably contributing to her pain. We discussed admission versus ongoing outpatient analgesics, and the patient was amenable to this latter option. Absent evidence for new intra-abdominal infection, obstruction, and with no evidence for hemodynamic instability aside from mild tachycardia, likely secondary to the patient's pain, the patient was discharged in stable condition to follow-up with her oncologist tomorrow with ongoing analgesics, bowel movement regimen.  Final Clinical Impression(s) / ED Diagnoses Final diagnoses:  Abdominal pain, generalized    Rx / DC Orders ED Discharge Orders         Ordered    senna-docusate (SENOKOT-S) 8.6-50 MG tablet  Daily        11/28/20 1937  Carmin Muskrat, MD 11/28/20 [id number]

## 2020-11-28 NOTE — Telephone Encounter (Signed)
[employer] called requested further clinical support to approve Lidocaine patches.  I have given them her clinical diagnosis and they are sending for review.

## 2020-11-28 NOTE — Telephone Encounter (Signed)
error 

## 2020-11-28 NOTE — ED Notes (Signed)
Pt placed on purewick catheter and informed that we need a urine sample

## 2020-11-28 NOTE — ED Notes (Signed)
Coming from cancer center-complaining of severe abdominal pain

## 2020-11-28 NOTE — Discharge Instructions (Addendum)
As discussed, today's evaluation has been generally reassuring aside from demonstration of possible progression of your malignancy into skeletal muscles in your lower back. Very important to follow-up with your oncologist tomorrow.  You are also starting a new medication regimen for assistance with bowel movements.  Please take these as directed.  Return here for concerning changes in your condition.

## 2020-11-28 NOTE — ED Notes (Signed)
PureWick placed on patient.

## 2020-11-28 NOTE — Progress Notes (Signed)
Patient was evaluated on Linac 3 machine in radiation oncology at the request of Dr [name] by Dr Lisbeth Renshaw.  Evaluation was to be for back pain.  Today when Dr Lisbeth Renshaw assessed she was complaining of severe abdominal pain 10 out of 10.  Per Dr. Lisbeth Renshaw patient needs further workup for abdominal pain and patient was brought to ER.    Report given to [facility].

## 2020-11-29 ENCOUNTER — Ambulatory Visit
Admission: RE | Admit: 2020-11-29 | Discharge: 2020-11-29 | Disposition: A | Discharge status: Home / Self Care | Payer: Medicare Other | Source: Ambulatory Visit | Attending: Radiation Oncology | Admitting: Radiation Oncology

## 2020-11-29 ENCOUNTER — Ambulatory Visit (HOSPITAL_COMMUNITY)
Admission: RE | Admit: 2020-11-29 | Discharge: 2020-11-29 | Disposition: A | Discharge status: Home / Self Care | Payer: Medicare Other | Source: Ambulatory Visit | Attending: Hematology | Admitting: Hematology

## 2020-11-29 ENCOUNTER — Encounter (HOSPITAL_COMMUNITY): Payer: Self-pay

## 2020-11-29 ENCOUNTER — Encounter: Payer: Self-pay | Admitting: Radiation Oncology

## 2020-11-29 ENCOUNTER — Other Ambulatory Visit: Payer: Self-pay

## 2020-11-29 DIAGNOSIS — C3490 Malignant neoplasm of unspecified part of unspecified bronchus or lung: Secondary | ICD-10-CM | POA: Diagnosis not present

## 2020-11-29 DIAGNOSIS — Z79899 Other long term (current) drug therapy: Secondary | ICD-10-CM | POA: Insufficient documentation

## 2020-11-29 DIAGNOSIS — C781 Secondary malignant neoplasm of mediastinum: Secondary | ICD-10-CM | POA: Diagnosis not present

## 2020-11-29 DIAGNOSIS — C771 Secondary and unspecified malignant neoplasm of intrathoracic lymph nodes: Secondary | ICD-10-CM | POA: Diagnosis not present

## 2020-11-29 DIAGNOSIS — C50911 Malignant neoplasm of unspecified site of right female breast: Secondary | ICD-10-CM | POA: Diagnosis not present

## 2020-11-29 DIAGNOSIS — C50919 Malignant neoplasm of unspecified site of unspecified female breast: Secondary | ICD-10-CM | POA: Diagnosis not present

## 2020-11-29 HISTORY — PX: IR IMAGING GUIDED PORT INSERTION: IMG5740

## 2020-11-29 MED ORDER — FENTANYL CITRATE (PF) 100 MCG/2ML IJ SOLN
INTRAMUSCULAR | Status: AC | PRN
  Administered 2020-11-29 (×2): 50 ug via INTRAVENOUS

## 2020-11-29 MED ORDER — SODIUM CHLORIDE 0.9 % IV SOLN: INTRAVENOUS | Status: DC

## 2020-11-29 MED ORDER — HEPARIN SOD (PORK) LOCK FLUSH 100 UNIT/ML IV SOLN
INTRAVENOUS | Status: AC
  Filled 2020-11-29: qty 5

## 2020-11-29 MED ORDER — LIDOCAINE-EPINEPHRINE 1 %-1:100000 IJ SOLN
INTRAMUSCULAR | Status: AC
  Filled 2020-11-29: qty 1

## 2020-11-29 MED ORDER — FENTANYL CITRATE (PF) 100 MCG/2ML IJ SOLN
INTRAMUSCULAR | Status: AC
  Filled 2020-11-29: qty 2

## 2020-11-29 MED ORDER — LIDOCAINE-EPINEPHRINE 1 %-1:100000 IJ SOLN
INTRAMUSCULAR | Status: AC | PRN
  Administered 2020-11-29: 20 mL

## 2020-11-29 NOTE — Progress Notes (Signed)
[place]   Telephone:([phone]) [phone] Fax:(336) [phone]   Clinic Follow up Note   Patient Care Team: Elby Showers, MD as PCP - General (Internal Medicine) [name], MD as Consulting Physician (Hematology) [name], MD (Inactive) as Consulting Physician (General Surgery) [name], NP as Nurse Practitioner (Nurse Practitioner) [name], MD as Consulting Physician (Plastic Surgery) [name], MD as Consulting Physician (Orthopedic Surgery)   I connected with Sandre Kitty on 12/01/2020 at  4:00 PM EST by telephone visit and verified that I am speaking with the correct person using two identifiers.  I discussed the limitations, risks, security and privacy concerns of performing an evaluation and management service by telephone and the availability of in person appointments. I also discussed with the patient that there may be a patient responsible charge related to this service. The patient expressed understanding and agreed to proceed.   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 ([place]) Staging form: Breast, AJCC 7th Edition - Clinical: Stage IIA (T2, N0, M0) - Signed by [name], MD on 02/08/2015 Laterality: Right Estrogen receptor status: Positive Progesterone receptor status: Positive HER2 status: Negative - Pathologic stage from 03/23/2015: Stage Unknown (T2, NX, cM0) - Unsigned  Small cell lung cancer ([place]) Staging form: Lung, AJCC 8th Edition - Clinical stage from 11/13/2020: Stage IV (cTX, cN3, cM1) - Signed by [name], MD on 11/16/2020 Stage prefix: Initial diagnosis     Infiltrating ductal carcinoma of right  female breast ([place])  08/28/1995 Cancer Diagnosis   Prior history of Stage I (T1N0M0) right breast invasive ductal carcinoma, S/P lumpectomy on 08/28/1995 with axilla lymph node dissection, 6 months of adjuvant chemotherapy with CMF([name]) and adjuvant radiation ([name]).   12/13/2014 Mammogram   Right breast: possible asymmetry warranting further evaluation with spot compression views and possibly ultrasound   12/16/2014 Breast US   Right breast: no definitive abnormality within the superior aspect of the right breast to correspond with the mammographic finding.   01/10/2015 Breast MRI   Right breast: irregular rim enhancing mass within the superior right breast at 11:30 measuring 2.5 x 2.4 x 2.4 cm. No abnormal enhancement is identified within the skin of the superior right breast in the area of concern on mammogram.   01/12/2015 Breast US   Second look diagnostic mammogram and ultrasound showed a 2.3 cm mass in the upper midline right breast. No axillary adenopathy.   01/12/2015 Initial Biopsy   Right breast needle core bx (upper midline): Invasive ductal carcinoma, grade 2, ER+ (100%), PR+ (77%), HER2/neu negative (ratio 1.15), Ki67 20%    01/30/2015 Procedure   Breast High/Moderate Risk panel ([facility]) reveals no clinically significant variant at ATM, BRCA1, BRCA2, CDH1, CHEK2, PALB2, PTEN, STK11, and TP53.   02/08/2015 Clinical Stage   Stage IIA (T2 N0)   03/23/2015 Definitive Surgery   Right mastectomy ([name]): invasive adenocarcinoma, grade 3, 2.9 cm, HER2/neu repeated, negative (ratio 1.23)   03/23/2015 Oncotype testing   Score: 8 (6% ROR). No chemotherapy [name]).   03/23/2015 Pathologic Stage   Stage IIA: pT2 pNx   05/04/2015 -  Anti-estrogen oral therapy   Letrozole 2.64m daily. Planned duration of therapy at  least 5 years.    07/25/2015 Survivorship   Survivorship visit completed and copy of care plan provided to patient.   12/15/2015 Mammogram   IMPRESSION: No  mammographic evidence of malignancy. A result letter of this screening mammogram will be mailed directly to the patient.   12/23/2016 Mammogram   IMPRESSION: No mammographic evidence of malignancy. A result letter of this screening mammogram will be mailed directly to the patient.    12/30/2017 Mammogram   12/30/2017 Mammogram IMPRESSION: No mammographic evidence of malignancy. A result letter of this screening mammogram will be mailed directly to the patient.   03/27/2020 Mammogram   IMPRESSION: No mammographic evidence of malignancy. A result letter of this screening mammogram will be mailed directly to the patient.   10/26/2020 Imaging   CT chest IMPRESSION: 1. Bulky mediastinal, thoracic inlet, right supraclavicular, and left axillary lymphadenopathy consistent with metastatic disease. Associated numerous bilateral pulmonary nodules and masses, most prominently in the right middle lobe with dominant 4.1 cm peripheral mass lesion. 2. 16 mm rim enhancing lesion posterior right liver consistent with metastatic involvement. 3. 13 mm hypoenhancing lesion in the tail of pancreas. Primary neoplasm or metastatic involvement could have this appearance. 4. Multiple nodules in the upper abdomen concerning for peritoneal/mesenteric metastatic disease. 5. Small right and tiny left pleural effusions with bibasilar atelectasis. 6. Aortic Atherosclerosis (ICD10-I70.0).   11/10/2020 PET scan   IMPRESSION: 1. Extensive hypermetabolic multi organ metastatic disease. 2. Bulky intensely hypermetabolic RIGHT supraclavicular, mediastinal and LEFT axillary adenopathy. 3. Hypermetabolic pulmonary mass in the RIGHT upper lobe with additional hypermetabolic nodules. 4. Hypermetabolic solitary hepatic metastasis. 5. Hypermetabolic lesion within the pancreas is also favored metastatic lesion. 6. Multifocal hypermetabolic skeletal metastasis. 7. Bilateral pleural effusions.   11/13/2020 Procedure    Thoracentesis  IMPRESSION: Successful ultrasound guided right thoracentesis yielding 200 mL of pleural fluid.   Small cell lung cancer ([place])  11/13/2020 Pathology Results   Right supraclavicular LN Biopsy  FINAL MICROSCOPIC DIAGNOSIS:   A. LYMPH NODE, RIGHT SUPRACLAVICULAR, NEEDLE CORE BIOPSY:  - Most consistent with small cell carcinoma.   COMMENT:   Tumor is limited in quantity, and partially necrotic.  TTF-1 and  Synaptophysin are positive.  Ki-67 proliferation index reaches 80-90%.  GATA-3, ER and p40 are negative.  The morphologic and immunophenotypic  characteristics are most suggestive of small cell carcinoma.  TTF-1  positive staining is compatible with origin from the lung; however, it  does not exclude origin from other entities.  Please note that distinct  nodal tissue is not identified in the submitted material.  Results  reported to Dr. [name] on 11/16/2020.  Dr. Melina Copa reviewed the case   11/13/2020 Cancer Staging   Staging form: Lung, AJCC 8th Edition - Clinical stage from 11/13/2020: Stage IV (cTX, cN3, cM1) - Signed by [name], MD on 11/16/2020 Stage prefix: Initial diagnosis   11/13/2020 - 11/28/2020 Radiation Therapy   palliative radiation to Medistinum with Dr Lisbeth Renshaw 11/13/20-11/28/20   11/16/2020 Initial Diagnosis   Small cell lung cancer ([place])   11/22/2020 -  Chemotherapy   First line etoposide D1-3, carboplatin and atezolizumab D1 q3weeks beginning 11/22/20.        11/29/2020 Procedure   PAC placed on 11/29/20      CURRENT THERAPY:  -palliative radiation to Mediastinum with Dr Lisbeth Renshaw 11/13/20-11/28/20 -First lineetoposideD1-3,carboplatin and atezolizumabD1 q3weeks beginning 11/22/20.  INTERVAL HISTORY:  AKIAH BAUCH is here for a follow up. She notes she was not seen by Dr Lisbeth Renshaw  yesterday. Back pain has improved today. She notes her PAC was placed 2 days ago. She notes she has lidocaine patch for the last 2 days and liquid Hycet which she is taking it  up to 3 times a day. She notes mild constipation and she is working to loosen her stool. She notes she was able to sleep well last night but some night she does not sleep well. Sometime this is due to pain or anxiety. She notes her pain can rage form 5-8/10 depending on how she moves. She notes she has had care nurse see her twice and PT once this week.     REVIEW OF SYSTEMS:   Constitutional: Denies fevers, chills or abnormal weight loss Eyes: Denies blurriness of vision Ears, nose, mouth, throat, and face: Denies mucositis or sore throat Respiratory: Denies cough, dyspnea or wheezes Cardiovascular: Denies palpitation, chest discomfort or lower extremity swelling Gastrointestinal:  Denies nausea, heartburn or change in bowel habits Skin: Denies abnormal skin rashes MSK: (+) Improved back pain  Lymphatics: Denies new lymphadenopathy or easy bruising Neurological:Denies numbness, tingling or new weaknesses Behavioral/Psych: Mood is stable, no new changes  All other systems were reviewed with the patient and are negative.  MEDICAL HISTORY:  Past Medical History:  Diagnosis Date  . Anxiety   . Asthma    does not take any medications, hx of inhaler use  . Breast cancer [facility]) 1995/2016   right sided breast cancer in 08/1994  . Bronchitis    hx of  . GERD (gastroesophageal reflux disease)   . History of hiatal hernia   . Hypertension   . Hyperthyroidism   . Lung cancer ([place]) 11/02/2020    SURGICAL HISTORY: Past Surgical History:  Procedure Laterality Date  . BREAST BIOPSY Right 2016   malignant  . BREAST EXCISIONAL BIOPSY Left 1996   benign  . Carpel Tunnel  Bilateral   . INCONTINENCE SURGERY     with mesh insertion about 7 years ago  . IR IMAGING GUIDED PORT INSERTION  11/29/2020  . LATISSIMUS FLAP TO BREAST Right 03/23/2015   Procedure: LATISSIMUS FLAP TO BREAST WITH PLACEMENT OF IMPLANT FOR BREAST RECONSTRUCTION;  Surgeon: [name], MD;  Location: [place];  Service:  Plastics;  Laterality: Right;  . MASTECTOMY Right 03/23/2015  . RECONSTRUCTION BREAST W/ LATISSIMUS DORSI FLAP Right 03/23/2015  . TONSILLECTOMY     as a child  . TOTAL KNEE ARTHROPLASTY Right 03/13/2018   Procedure: RIGHT TOTAL KNEE ARTHROPLASTY;  Surgeon: Sydnee Cabal, MD;  Location: [facility];  Service: Orthopedics;  Laterality: Right;  Adductor Block  . TOTAL MASTECTOMY Right 03/23/2015   Procedure: RIGHT TOTAL MASTECTOMY;  Surgeon: [name], MD;  Location: [place];  Service: General;  Laterality: Right;    I have reviewed the social history and family history with the patient and they are unchanged from previous note.  ALLERGIES:  is allergic to fosamax [alendronate sodium].  MEDICATIONS:  Current Outpatient Medications  Medication Sig Dispense Refill  . allopurinol (ZYLOPRIM) 300 MG tablet Take 1 tablet (300 mg total) by mouth daily. 30 tablet 1  . ALPRAZolam (XANAX) 0.5 MG tablet Take 1 tablet by mouth twice daily as needed (Patient taking differently: Take 0.5 mg by mouth 2 (two) times daily as needed for anxiety.) 180 tablet 0  . cyclobenzaprine (FLEXERIL) 10 MG tablet Take 1 tablet (10 mg total) by mouth 3 (three) times daily as needed for muscle spasms. 30 tablet 2  . esomeprazole (NEXIUM) 40 MG  capsule One po daily at bedtime (Patient taking differently: Take 40 mg by mouth daily.) 90 capsule 1  . EUTHYROX 75 MCG tablet Take 1 tablet by mouth once daily (Patient taking differently: Take 75 mcg by mouth daily before breakfast.) 90 tablet 0  . HYDROcodone-acetaminophen (HYCET) 7.5-325 mg/15 ml solution Take 10-15 mLs by mouth every 4 (four) hours as needed for moderate pain or severe pain. 473 mL 0  . HYDROcodone-homatropine (HYCODAN) 5-1.5 MG/5ML syrup Take 5 mLs by mouth every 8 (eight) hours as needed for cough. 120 mL 0  . letrozole (FEMARA) 2.5 MG tablet Take 1 tablet (2.5 mg total) by mouth daily. 90 tablet 3  . lidocaine (LIDODERM) 5 % Place 1 patch onto the skin daily.  Remove & Discard patch within 12 hours or as directed by MD 30 patch 0  . meloxicam (MOBIC) 7.5 MG tablet Take 7.5 mg by mouth 2 (two) times daily as needed for pain.    . metoprolol succinate (TOPROL-XL) 50 MG 24 hr tablet Take 1 tablet by mouth twice daily (Patient taking differently: Take 50 mg by mouth 2 (two) times daily.) 60 tablet 1  . ondansetron (ZOFRAN) 8 MG tablet Take 1 tablet (8 mg total) by mouth 2 (two) times daily as needed for refractory nausea / vomiting. Start on day 3 after carboplatin chemo. 30 tablet 1  . prochlorperazine (COMPAZINE) 10 MG tablet Take 1 tablet (10 mg total) by mouth every 6 (six) hours as needed (Nausea or vomiting). 30 tablet 1  . senna-docusate (SENOKOT-S) 8.6-50 MG tablet Take 1 tablet by mouth daily. 30 tablet 0  . simvastatin (ZOCOR) 40 MG tablet TAKE 1 TABLET BY MOUTH AT BEDTIME (Patient taking differently: Take by mouth daily at 6 (six) AM.) 90 tablet 0  . sucralfate (CARAFATE) 1 g tablet Take 1 tablet (1 g total) by mouth 4 (four) times daily. Dissolve each tablet in 15 cc water before use. 120 tablet 2  . verapamil (CALAN-SR) 240 MG CR tablet Take 1 tablet by mouth once daily (Patient taking differently: Take 240 mg by mouth daily.) 90 tablet 0   No current facility-administered medications for this visit.    PHYSICAL EXAMINATION: ECOG PERFORMANCE STATUS: 3 - Symptomatic, >50% confined to bed  No vitals taken today, Exam not performed today   LABORATORY DATA:  I have reviewed the data as listed CBC Latest Ref Rng & Units 11/28/2020 11/27/2020 11/20/2020  WBC 4.0 - 10.5 K/uL 4.3 5.1 6.8  Hemoglobin 12.0 - 15.0 g/dL 11.8(L) 11.7(L) 11.5(L)  Hematocrit 36.0 - 46.0 % 37.1 35.4(L) 35.9(L)  Platelets 150 - 400 K/uL 293 328 482(H)     CMP Latest Ref Rng & Units 11/28/2020 11/27/2020 11/20/2020  Glucose 70 - 99 mg/dL 100(H) 94 87  BUN 8 - 23 mg/dL _0 Creatinine 0.44 - 1.00 mg/dL 0.78 0.77 0.87  Sodium 135 - 145 mmol/L 135 135 138  Potassium 3.5  - 5.1 mmol/L 4.0 4.2 3.9  Chloride 98 - 111 mmol/L 101 102 103  CO2 22 - 32 mmol/L _1 Calcium 8.9 - 10.3 mg/dL 8.8(L) 8.8(L) 8.8(L)  Total Protein 6.5 - 8.1 g/dL 6.9 6.9 6.9  Total Bilirubin 0.3 - 1.2 mg/dL 0.7 0.7 0.3  Alkaline Phos 38 - 126 U/L 95 101 113  AST 15 - 41 U/L 53(H) 48(H) 40  ALT 0 - 44 U/L 74(H) 59(H) 24      RADIOGRAPHIC STUDIES: I have personally reviewed the radiological  images as listed and agreed with the findings in the report. No results found.   ASSESSMENT & PLAN:  Tara Rodgers is a 75 y.o. 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 In the past 3 months she had progressive SOB and mild cough. She also has increasing right chest/flank pain.  -HerCT chest from 1/13/22showed numerous bilateral pulmonary nodules and masses, most prominently in the right middle lobe with dominant 4.1 cmmass withBulky mediastinal, thoracic inlet, right supraclavicular, and left axillary lymphadenopathy consistent with metastatic disease. Scan also shows16 mm lesioninright liveralong withMultiple nodules in the upper abdomen concerningfor metastatic disease. There is also a13 mm hypoenhancing lesion in the tail of pancreas. -Her PET from 1/28/22confirmed diffuse metastatic disease -She has alargepalpable right supraclavicular lymph nodes. Biopsy of LN on 11/13/20 showedsmall cell lung cancer, TTF-1(+). Her cancer is stage IV and no longer curable but still treatable.  -Given her metastatic lung cancer, she will proceed with MRI brain on 12/01/20.  -I discussed the overall poor prognosis,given the aggressive nature of small cell lung cancer, and high disease burden.She is currently on supplemental oxygen 3L.  -I started her on first-line chemo withetoposideD1-3,carboplatin and  atezolizumabD1 q3weeks beginning2/14/22.  -F/u in 1 week   2. Bone Metastasis, Back Pain and Mid Chest pain, Dysphagia  -She had chest pain from her mediastinal bone met as seen on 11/10/20 PET scan. She is s/p palliative radiation to Mediastinum with Dr Lisbeth Renshaw 11/13/20-11/28/20.  -She has Dysphagia from recent Mediastinum RT. She struggles to keep food down. Continue to increase Ensure to 2-3 bottles a day and eat smaller more frequent meals. I also encouraged her to increase water intake.  -She has had back pain that has worsened in the recent weeks, 10/10. She has L3 bone met as seen on PET as well.  -I have talked to Dr. Lisbeth Renshaw about palliative RT to L3. Dr. Lisbeth Renshaw feels her overall condition is very poor and not sure if palliative RT will be still beneficial at this point, will see how things go next few weeks   -She can barely stand or walk due to worsened pain. She has walker at home as needed. She will continue PT.  -For pain she has increased Hycet to TID. She also started Lidocaine patch. Her pain improved, but not fully controlled. I recommend switching lidocaine patch to Fentanyl patch (12/01/20). She is agreeable.  -I advised her to take miralax daily to make sure she has a regular BM as pain medication can cause constipation.    3. Symptom Management: Constipation, LE edema  -She is more constipated lately. I discussed pain medication can lead to constipation. I recommend suppository and laxative to have BM daily.  -For her LE edema she can use compression sock and elevates her feet.   4. Recurrent right breast invasive ductal carcinoma, pT2N0, stage II, grade 3, ER positive, PR positive, HER-2 negative.  -She was initiallydiagnosed in 1996 with right breast cancer. She was treated with right breast lumpectomy, 6 months of chemo CMF and adjuvant radiation.  -She was diagnosed with cancer recurrence in 12/2014. She iss/prightmastectomy with completed resection.Oncotype showed low  risk diseaseso I did not recommend adjuvantchemotherapy. -She started anti-estrogen therapy withletrozolein 04/2015. Plan fortotal of 7-10 years.Tolerating well.  5. Osteopenia -Shepreviouslyhad Osteoporosis. Shewasgetting Prolia at her PCP's office Every 6 months,she stopped due to high co-payafter about  2 years. She notes she did not tolerate oral Alendronate so she stopped after 5 years -Her DEXA from 9/30/19sheimprovedto osteopeniawithlowest T-Score of -2.2 at [facility] Sp[facility] with fracture risk of 20% to spine and 6.4% risk of hip fracture.Her 03/27/20 DEXA showed Osteopenia with -2.3 at right hip. -Shepreviouslydeclined bisphosphonate with Zometa injections. She has had at least 7 years of bisphosphonateand Proliain the past, which is adequate.  -continue calcium 1 g, vitamin D at least 1000 units daily, and weight-bearing exercise distress or bone.  6.Comorbidities:Arthritis,HTN, Tachycardia  -Shehad a right knee replacement in May 2019 -She continuesto f/u with PCP  7. Social Support, Goal of care discussion, DNR/DNI   -She notes her daughter and husband know she is here but not aware of her scan results and very supportive.  -I again offered her the chance to speak with our SW for counseling. She is open to this now. Will refer.  -She has home care with nurse aid and advanced home health. If not enough, may refer her to palliative home care.  -We again discussed the incurable nature of her cancer, and the overall poor prognosis, especially if she does not have good response to chemotherapy or progress on chemo -The patient understands the goal of care is palliative. -I previously recommended DNR/DNI, she agreed (11/27/20). I encourage her to have living will. She is not interested in feeding tube unless she really needs it.    PLAN -I called in Fentanyl patch 12.6mg/hr for her to better control her pain -MR Brain on 12/01/20 (later today), will call her with  results  -Lab, flush, F/u on 2/25 at 2:40pm   No problem-specific Assessment & Plan notes found for this encounter.   No orders of the defined types were placed in this encounter.  I discussed the assessment and treatment plan with the patient. The patient was provided an opportunity to ask questions and all were answered. The patient agreed with the plan and demonstrated an understanding of the instructions.  The patient was advised to call back or seek an in-person evaluation if the symptoms worsen or if the condition fails to improve as anticipated.  The total time spent in the appointment was 22 minutes.    YTruitt Merle MD 12/01/2020   I, AJoslyn Devon am acting as scribe for YTruitt Merle MD.   I have reviewed the above documentation for accuracy and completeness, and I agree with the above.

## 2020-11-29 NOTE — Procedures (Signed)
Interventional Radiology Procedure Note  Procedure: Chest port  Indication: Breast Ca  Findings: Please refer to procedural dictation for full description.  Complications: None  EBL: < 10 mL  Miachel Roux, MD [phone]

## 2020-11-29 NOTE — Discharge Instructions (Signed)
Please call Interventional Radiology clinic [phone] with any questions or concerns.  You may remove your dressing and shower tomorrow.  DO NOT use EMLA cream for 2 weeks after port placement as this cream will remove surgical glue on your incision.   Implanted Port Insertion, Care After This sheet gives you information about how to care for yourself after your procedure. Your health care provider may also give you more specific instructions. If you have problems or questions, contact your health care provider. What can I expect after the procedure? After the procedure, it is common to have:  Discomfort at the port insertion site.  Bruising on the skin over the port. This should improve over 3-4 days. Follow these instructions at home: [facility] care  After your port is placed, you will get a manufacturer's information card. The card has information about your port. Keep this card with you at all times.  Take care of the port as told by your health care provider. Ask your health care provider if you or a family member can get training for taking care of the port at home. A home health care nurse may also take care of the port.  Make sure to remember what type of port you have. Incision care  Follow instructions from your health care provider about how to take care of your port insertion site. Make sure you: ? Wash your hands with soap and water before and after you change your bandage (dressing). If soap and water are not available, use hand sanitizer. ? Change your dressing as told by your health care provider. ? Leave stitches (sutures), skin glue, or adhesive strips in place. These skin closures may need to stay in place for 2 weeks or longer. If adhesive strip edges start to loosen and curl up, you may trim the loose edges. Do not remove adhesive strips completely unless your health care provider tells you to do that.  Check your port insertion site every day for signs of infection.  Check for: ? Redness, swelling, or pain. ? Fluid or blood. ? Warmth. ? Pus or a bad smell.      Activity  Return to your normal activities as told by your health care provider. Ask your health care provider what activities are safe for you.  Do not lift anything that is heavier than 10 lb (4.5 kg), or the limit that you are told, until your health care provider says that it is safe. General instructions  Take over-the-counter and prescription medicines only as told by your health care provider.  Do not take baths, swim, or use a hot tub until your health care provider approves. Ask your health care provider if you may take showers. You may only be allowed to take sponge baths.  Do not drive for 24 hours if you were given a sedative during your procedure.  Wear a medical alert bracelet in case of an emergency. This will tell any health care providers that you have a port.  Keep all follow-up visits as told by your health care provider. This is important. Contact a health care provider if:  You cannot flush your port with saline as directed, or you cannot draw blood from the port.  You have a fever or chills.  You have redness, swelling, or pain around your port insertion site.  You have fluid or blood coming from your port insertion site.  Your port insertion site feels warm to the touch.  You have pus or a  bad smell coming from the port insertion site. Get help right away if:  You have chest pain or shortness of breath.  You have bleeding from your port that you cannot control. Summary  Take care of the port as told by your health care provider. Keep the manufacturer's information card with you at all times.  Change your dressing as told by your health care provider.  Contact a health care provider if you have a fever or chills or if you have redness, swelling, or pain around your port insertion site.  Keep all follow-up visits as told by your health care  provider. This information is not intended to replace advice given to you by your health care provider. Make sure you discuss any questions you have with your health care provider. Document Revised: 04/28/2018 Document Reviewed: 04/28/2018 [employer] Patient Education  [place].

## 2020-11-29 NOTE — H&P (Signed)
Chief Complaint: Patient was seen in consultation today for image-guided port-a-catheter placement  Referring Physician(s): [name]  Supervising Physician: Mir, Sharen Heck  Patient Status: [facility] - Out-pt  History of Present Illness: Tara Rodgers is a 75 y.o. 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 She presented to her PCP with complaints of worsening shortness of breath and pain in her chest. A chest CT 10/26/20 showed multiple lung masses and chest lymphadenopathy. She was seen in IR 11/13/20 for a lymph node biopsy and pathology returned positive for small cell carcinoma. She has begun chemotherapy and is in need of reliable, long-term access.   Interventional Radiology has been asked to evaluate this patient for an image-guided port-a-catheter placement to facilitate her treatment goals.  Of note, she was evaluated in the ED yesterday for abdominal/back pain with associated nausea and vomiting after completing radiation therapy. CT imaging was negative for any acute findings but was positive for skeletal disease progression. She was discharged home with analgesics.    Past Medical History:  Diagnosis Date  . Anxiety   . Asthma    does not take any medications, hx of inhaler use  . Breast cancer [facility]) 1995/2016   right sided breast cancer in 08/1994  . Bronchitis    hx of  . GERD (gastroesophageal reflux disease)   . History of hiatal hernia   . Hypertension   . Hyperthyroidism   . Lung cancer ([place]) 11/02/2020    Past Surgical History:  Procedure Laterality Date  . BREAST BIOPSY Right 2016   malignant  . BREAST EXCISIONAL BIOPSY Left 1996   benign  . Carpel Tunnel  Bilateral   . INCONTINENCE SURGERY     with mesh insertion about 7 years ago  . LATISSIMUS FLAP TO BREAST Right 03/23/2015   Procedure: LATISSIMUS FLAP TO BREAST WITH  PLACEMENT OF IMPLANT FOR BREAST RECONSTRUCTION;  Surgeon: [name], MD;  Location: [place];  Service: Plastics;  Laterality: Right;  . MASTECTOMY Right 03/23/2015  . RECONSTRUCTION BREAST W/ LATISSIMUS DORSI FLAP Right 03/23/2015  . TONSILLECTOMY     as a child  . TOTAL KNEE ARTHROPLASTY Right 03/13/2018   Procedure: RIGHT TOTAL KNEE ARTHROPLASTY;  Surgeon: Sydnee Cabal, MD;  Location: [facility];  Service: Orthopedics;  Laterality: Right;  Adductor Block  . TOTAL MASTECTOMY Right 03/23/2015   Procedure: RIGHT TOTAL MASTECTOMY;  Surgeon: [name], MD;  Location: [place];  Service: General;  Laterality: Right;    Allergies: Fosamax [alendronate sodium]  Medications: Prior to Admission medications   Medication Sig Start Date End Date Taking? Authorizing Provider  allopurinol (ZYLOPRIM) 300 MG tablet Take 1 tablet (300 mg total) by mouth daily. 11/17/20   [name], MD  ALPRAZolam Duanne Moron) 0.5 MG tablet Take 1 tablet by mouth twice daily as needed Patient taking differently: Take 0.5 mg by mouth 2 (two) times daily as needed for anxiety. 09/11/20   Elby Showers, MD  cyclobenzaprine (FLEXERIL) 10 MG tablet Take 1 tablet (10 mg total) by mouth 3 (three) times daily as needed for muscle spasms. 10/18/19   Elby Showers, MD  esomeprazole (NEXIUM) 40 MG capsule One po daily at bedtime Patient taking differently: Take 40 mg by mouth daily. 08/24/20   Elby Showers, MD  EUTHYROX 75 MCG tablet Take 1 tablet by mouth once daily Patient taking differently: Take 75 mcg by  mouth daily before breakfast. 11/19/20   Elby Showers, MD  HYDROcodone-acetaminophen (HYCET) 7.5-325 mg/15 ml solution Take 10-15 mLs by mouth every 4 (four) hours as needed for moderate pain or severe pain. 11/27/20   [name], MD  HYDROcodone-homatropine [facility]) 5-1.5 MG/5ML syrup Take 5 mLs by mouth every 8 (eight) hours as needed for cough. Patient not taking: No sig reported 10/24/20   Elby Showers, MD  letrozole [facility])  2.5 MG tablet Take 1 tablet (2.5 mg total) by mouth daily. 03/30/20   [name], MD  lidocaine (LIDODERM) 5 % Place 1 patch onto the skin daily. Remove & Discard patch within 12 hours or as directed by MD 11/27/20   [name], MD  meloxicam (MOBIC) 7.5 MG tablet Take 7.5 mg by mouth 2 (two) times daily as needed for pain.    [provider]  metoprolol succinate (TOPROL-XL) 50 MG 24 hr tablet Take 1 tablet by mouth twice daily Patient taking differently: Take 50 mg by mouth 2 (two) times daily. 11/20/20   Lorretta Harp, MD  ondansetron (ZOFRAN) 8 MG tablet Take 1 tablet (8 mg total) by mouth 2 (two) times daily as needed for refractory nausea / vomiting. Start on day 3 after carboplatin chemo. 11/16/20   [name], MD  prochlorperazine (COMPAZINE) 10 MG tablet Take 1 tablet (10 mg total) by mouth every 6 (six) hours as needed (Nausea or vomiting). Patient not taking: No sig reported 11/16/20   [name], MD  senna-docusate (SENOKOT-S) 8.6-50 MG tablet Take 1 tablet by mouth daily. 11/28/20   Carmin Muskrat, MD  simvastatin (ZOCOR) 40 MG tablet TAKE 1 TABLET BY MOUTH AT BEDTIME Patient taking differently: Take by mouth daily at 6 (six) AM. 10/03/20   [name], [name] [name], MD  sucralfate (CARAFATE) 1 g tablet Take 1 tablet (1 g total) by mouth 4 (four) times daily. Dissolve each tablet in 15 cc water before use. Patient not taking: No sig reported 11/17/20   Kyung Rudd, MD  verapamil (CALAN-SR) 240 MG CR tablet Take 1 tablet by mouth once daily Patient taking differently: Take 240 mg by mouth daily. 09/11/20   Elby Showers, MD     Family History  Problem Relation Age of Onset  . COPD Father   . Heart disease Father     Social History   Socioeconomic History  . Marital status: Married    Spouse name: Not on file  . Number of children: 2  . Years of education: Not on file  . Highest education level: Not on file  Occupational History  . Not on file  Tobacco Use  . Smoking status: Never  Smoker  . Smokeless tobacco: Never Used  Substance and Sexual Activity  . Alcohol use: Yes    Comment: occasional  . Drug use: No  . Sexual activity: Not on file  Other Topics Concern  . Not on file  Social History Narrative  . Not on file   Social Determinants of Health   Financial Resource Strain: Not on file  Food Insecurity: Not on file  Transportation Needs: Not on file  Physical Activity: Not on file  Stress: Not on file  Social Connections: Not on file    Review of Systems: A 12 point ROS discussed and pertinent positives are indicated in the HPI above.  All other systems are negative.  Review of Systems  Constitutional: Positive for appetite change and fatigue.  Respiratory: Negative for cough and shortness  of breath.   Cardiovascular: Positive for leg swelling. Negative for chest pain.  Gastrointestinal: Positive for abdominal pain and constipation. Negative for diarrhea and vomiting.  Musculoskeletal: Positive for back pain.  Neurological: Negative for dizziness and headaches.    Vital Signs: BP (!) 139/96   Pulse (!) 111   Temp 98 F (36.7 C) (Oral)   Resp 18   SpO2 98%   Physical Exam Constitutional:      General: She is not in acute distress. HENT:     Mouth/Throat:     Mouth: Mucous membranes are moist.     Pharynx: Oropharynx is clear.  Cardiovascular:     Rate and Rhythm: Regular rhythm. Tachycardia present.     Pulses: Normal pulses.     Heart sounds: Normal heart sounds.  Pulmonary:     Effort: Pulmonary effort is normal.     Breath sounds: Normal breath sounds.  Abdominal:     General: Bowel sounds are normal.     Palpations: Abdomen is soft.  Musculoskeletal:     Right lower leg: Edema present.     Left lower leg: Edema present.  Skin:    General: Skin is warm and dry.  Neurological:     Mental Status: She is alert and oriented to person, place, and time.     Imaging: DG Chest 1 View  Result Date: 11/13/2020 CLINICAL DATA:   Shortness of breath.  Status post thoracentesis. EXAM: CHEST  1 VIEW COMPARISON:  November 08, 2020. FINDINGS: Stable cardiomediastinal silhouette. Mild left pleural effusion is noted with increased left basilar opacity concerning for worsening atelectasis or infiltrate. No significant pleural effusion is noted on the right. Right basilar atelectasis is noted. No pneumothorax is noted. Bony thorax is unremarkable. IMPRESSION: Mild left pleural effusion is noted with increased left basilar opacity concerning for worsening atelectasis or infiltrate. No pneumothorax is noted. Right basilar atelectasis is noted. Electronically Signed   By: Marijo Conception M.D.   On: 11/13/2020 16:10   DG Chest 2 View  Result Date: 11/08/2020 CLINICAL DATA:  Increasing shortness of breath, chest pain. EXAM: CHEST - 2 VIEW COMPARISON:  CT chest 10/26/2020 and chest radiograph 10/24/2020. FINDINGS: Trachea is midline. Heart size stable. Bulky mediastinal adenopathy. Small bilateral pleural effusions with associated bibasilar volume loss. Bilateral pulmonary nodules and right middle lobe mass, better seen on 10/26/2020 CT. Surgical clips in the right axilla. IMPRESSION: 1. Bulky mediastinal adenopathy. Bilateral pulmonary nodules and right middle lobe mass, better seen on 10/26/2020. 2. Small bilateral pleural effusions with mild bibasilar volume loss. Electronically Signed   By: Lorin Picket M.D.   On: 11/08/2020 13:38   CT ABDOMEN PELVIS W CONTRAST  Result Date: 11/28/2020 CLINICAL DATA:  Metastatic breast cancer. Abdominal pain for 3 days. EXAM: CT ABDOMEN AND PELVIS WITH CONTRAST TECHNIQUE: Multidetector CT imaging of the abdomen and pelvis was performed using the standard protocol following bolus administration of intravenous contrast. CONTRAST:  130mL OMNIPAQUE IOHEXOL 300 MG/ML  SOLN COMPARISON:  PET-CT scan 11/10/2020 FINDINGS: Lower chest: Bilateral pleural effusions again demonstrated. Near complete atelectasis of the  LEFT lower lobe similar to prior. Lung mass along the pleural surface of the RIGHT middle lobe measures 4.2 cm, no change from the FDG PET scan. Limited view of the mediastinum demonstrates a necrotic lymph node in the substernal space measuring 4.1 cm which is hypermetabolic on comparison PET-CT scan Hepatobiliary: Solitary hepatic metastasis in the RIGHT hepatic lobe measures 1.4 cm. No biliary duct  dilatation. Gallbladder appears normal. Pancreas: Pancreas is normal. No ductal dilatation. No pancreatic inflammation. Spleen: Normal spleen Adrenals/urinary tract: Adrenal glands and kidneys are normal. The ureters and bladder normal. Stomach/Bowel: Stomach and duodenum normal. No dilated loops of small bowel. No evidence of bowel inflammation. Appendix not identified but no secondary signs appendicitis. Moderate volume stool in the ascending colon. The descending colon is collapsed. Moderate volume stool ball in the rectum measures 5.8 cm. Vascular/Lymphatic: Abdominal aorta is normal caliber with atherosclerotic calcification. There is no retroperitoneal or periportal lymphadenopathy. No pelvic lymphadenopathy. Reproductive: Post hysterectomy.  Adnexa unremarkable Other: No ascites.  Intraperitoneal free air. Musculoskeletal: Severe degenerative change of the LEFT hip joint. Multiple skeletal metastasis seen on comparison FDG PET scan are not well appreciated. There are several sclerotic lesions in the lumbar spine. There is extension beyond the vertebral body into the LEFT paraspinal musculature at the L2-L3 vertebral body level (lesion measures 2 cm on image 34/2). IMPRESSION: 1. Again demonstrate multifocal metastasis including RIGHT lung mass, large necrotic mediastinal lymph node and hepatic metastasis. 2. No evidence of bowel obstruction. Large volume stool in the ascending colon rectum could indicate constipation. 3. Bilateral moderate effusions and dense atelectasis of the LEFT lower lobe, similar prior.  Sclerotic skeletal metastasis in the lumbar spine. 4. Extension of skeletal metastasis at L3/L2 into the LEFT paraspinal musculature. This may be a source of pain. Electronically Signed   By: Suzy Bouchard M.D.   On: 11/28/2020 19:09   NM PET Image Initial (PI) Skull Base To Thigh  Result Date: 11/10/2020 CLINICAL DATA:  Subsequent treatment strategy for breast carcinoma. Pulmonary mass. RIGHT breast carcinoma with mastectomy 2016. EXAM: NUCLEAR MEDICINE PET SKULL BASE TO THIGH TECHNIQUE: 6.9 mCi F-18 FDG was injected intravenously. Full-ring PET imaging was performed from the skull base to thigh after the radiotracer. CT data was obtained and used for attenuation correction and anatomic localization. Fasting blood glucose: 95 mg/dl COMPARISON:  10/26/2020 FINDINGS: Mediastinal blood pool activity: SUV max 2.6 Liver activity: SUV max NA NECK: Intensely hypermetabolic enlarged RIGHT supraclavicular nodes. Example 2.4 cm node on image 37 with SUV max equal 37.1. Incidental CT findings: none CHEST: Intensely hypermetabolic nodal mass in the prevascular space measures 3.5 cm with SUV max 35.9. There is extensive paratracheal subcarinal adenopathy with equal intense metabolic activity. Adenopathy extends into the RIGHT hilar nodes with SUV max equal 18.9. Hypermetabolic LEFT axillary lymph nodes additionally Along the anterior aspect of the RIGHT upper lobe there is a large mass along the pleural surface measuring 4.2 x 1.8 cm with SUV max equal 32. There several adjacent of pulmonary nodules also with intense metabolic activity Incidental CT findings: Bilateral layering moderate pleural effusions. Post RIGHT mastectomy with breast prosthetic. ABDOMEN/PELVIS: Solitary hypermetabolic lesion in the posterior RIGHT hepatic lobe measures 1.6 cm with SUV max 6.3. Hypermetabolic mass centrally within pancreas measuring 1.8 cm with SUV max equal 13.2 on image 103. Incidental CT findings: none SKELETON: Multifocal  skeletal metastasis within the pelvis, spine and RIGHT humerus. Example lesion in the central sacrum with SUV max equal 12. Minimal CT change at this level. Intense metabolic activity at L3 with SUV max equal 15. Activity occupies the near entirety of the vertebral body with minimal CT change. A focal lesion in the RIGHT proximal humerus is subtly seen within the marrow space on image 35). Incidental CT findings: none IMPRESSION: 1. Extensive hypermetabolic multi organ metastatic disease. 2. Bulky intensely hypermetabolic RIGHT supraclavicular, mediastinal and LEFT axillary adenopathy.  3. Hypermetabolic pulmonary mass in the RIGHT upper lobe with additional hypermetabolic nodules. 4. Hypermetabolic solitary hepatic metastasis. 5. Hypermetabolic lesion within the pancreas is also favored metastatic lesion. 6. Multifocal hypermetabolic skeletal metastasis. 7. Bilateral pleural effusions. Electronically Signed   By: Suzy Bouchard M.D.   On: 11/10/2020 14:04   [place] CORE BIOPSY (LYMPH NODES)  Result Date: 11/13/2020 INDICATION: 75 year old female with history of breast cancer, newly diagnosed pulmonary mass and right supraclavicular lymphadenopathy. EXAM: ULTRASOUND GUIDED core BIOPSY OF right supraclavicular lymph node MEDICATIONS: None. ANESTHESIA/SEDATION: Fentanyl 25 mcg IV; Versed 0.5 mg IV Moderate Sedation Time:  11 The patient was continuously monitored during the procedure by the interventional radiology nurse under my direct supervision. PROCEDURE: The procedure, risks, benefits, and alternatives were explained to the patient. Questions regarding the procedure were encouraged and answered. The patient understands and consents to the procedure. The right neck was prepped with chlorhexidine in a sterile fashion, and a sterile drape was applied covering the operative field. A sterile gown and sterile gloves were used for the procedure. Local anesthesia was provided with 1% Lidocaine at the planned needle  entry site. A small skin nick was made. Deeper local anesthesia was provided under direct ultrasound guidance along the periphery of the abnormal lymph node which measured up to approximately 2.8 cm in maximum diameter. An 18 gauge, 15 cm trocar needle was then advanced into the periphery of the abnormal lymph node and a total of 3 core biopsies were obtained. The biopsy samples were placed in formalin and sent to pathology. Postprocedure ultrasound evaluation demonstrated no evidence of surrounding hematoma. Hemostasis was achieved with brief manual compression at the needle entry site. A bandage was placed. The patient tolerated the procedure well and was discharged home. COMPLICATIONS: None immediate. IMPRESSION: Technically successful ultrasound-guided core biopsy of abnormal right supraclavicular lymph node. Ruthann Cancer, MD [facility] Electronically Signed   By: Ruthann Cancer MD   On: 11/13/2020 18:33   US THORACENTESIS ASP PLEURAL SPACE W/IMG GUIDE  Result Date: 11/13/2020 INDICATION: 75 year old female with history of right breast cancer and newly diagnosed pulmonary mass with increasing shortness of breath presenting for therapeutic thoracentesis at the time of right supraclavicular mass biopsy. EXAM: ULTRASOUND GUIDED RIGHT THORACENTESIS MEDICATIONS: None. COMPLICATIONS: None immediate. PROCEDURE: An ultrasound guided thoracentesis was thoroughly discussed with the patient and questions answered. The benefits, risks, alternatives and complications were also discussed. The patient understands and wishes to proceed with the procedure. Written consent was obtained. Ultrasound was performed to localize and mark an adequate pocket of fluid in the right chest. The area was then prepped and draped in the normal sterile fashion. 1% Lidocaine was used for local anesthesia. Under ultrasound guidance a 6 Fr Safe-T-Centesis catheter was introduced.  Thoracentesis was performed. The catheter was removed and a dressing applied. FINDINGS: A total of approximately 200 mL of translucent, straw-colored fluid was removed. IMPRESSION: Successful ultrasound guided right thoracentesis yielding 200 mL of pleural fluid. Ruthann Cancer, MD [facility] Electronically Signed   By: Ruthann Cancer MD   On: 11/13/2020 18:31    Labs:  CBC: Recent Labs    11/16/20 1309 11/20/20 1507 11/27/20 1318 11/28/20 1535  WBC 9.8 6.8 5.1 4.3  HGB 11.3* 11.5* 11.7* 11.8*  HCT 35.9* 35.9* 35.4* 37.1  PLT 448* 482* 328 293    COAGS: No results for input(s): INR, APTT in the last 8760 hours.  BMP: Recent Labs  02/14/20 1003 03/30/20 1357 10/25/20 1450 11/08/20 1342 11/17/20 1013 11/20/20 1507 11/27/20 1318 11/28/20 1535  NA 144 139 143   < > 137 138 135 135  K 5.0 4.2 5.0   < > 4.3 3.9 4.2 4.0  CL 111* 108 106   < > 103 103 102 101  CO2 26 22 25    < > 24 24 23 23   GLUCOSE 103* 107* 98   < > 94 87 94 100*  BUN 24 23 20  20    < > 17 11 14 15   CALCIUM 8.8 9.3 9.6   < > 8.9 8.8* 8.8* 8.8*  CREATININE 1.22* 1.26* 1.23*  1.23*   < > 1.09* 0.87 0.77 0.78  GFRNONAA 44* 43* 43*   < > 54* >60 >60 >60  GFRAA 51* 49* 50*  --   --   --   --   --    < > = values in this interval not displayed.    LIVER FUNCTION TESTS: Recent Labs    11/16/20 1309 11/20/20 1507 11/27/20 1318 11/28/20 1535  BILITOT 0.4 0.3 0.7 0.7  AST 54* 40 48* 53*  ALT 34 24 59* 74*  ALKPHOS 120 113 101 95  PROT 6.9 6.9 6.9 6.9  ALBUMIN 2.9* 3.0* 3.3* 3.6    TUMOR MARKERS: No results for input(s): AFPTM, CEA, CA199, CHROMGRNA in the last 8760 hours.  Assessment and Plan:  Small cell lung cancer: [place]. [place], 75 year old female, presents today to the [place] department for an image-guided port-a-catheter placement. She has not been NPO and was given the option to reschedule or proceed  with a local anesthetic and fentanyl only. She wishes to proceed with local anesthetic and fentanyl only.   Risks and benefits of an image-guided port-a-catheter placement were discussed with the patient including, but not limited to bleeding, infection, pneumothorax, or fibrin sheath development and need for additional procedures.  All of the patient's questions were answered, patient is agreeable to proceed.  Consent signed and in chart.  Thank you for this interesting consult.  I greatly enjoyed meeting Tara Rodgers and look forward to participating in their care.  A copy of this report was sent to the requesting provider on this date.  Electronically Signed: Soyla Dryer, AGACNP-BC [phone] 11/29/2020, 11:06 AM   I spent a total of  30 Minutes   in face to face in clinical consultation, greater than 50% of which was counseling/coordinating care for image-guided port-a-catheter placement.

## 2020-11-30 ENCOUNTER — Encounter: Payer: Self-pay | Admitting: General Practice

## 2020-11-30 NOTE — Progress Notes (Signed)
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  Lives at home w husband, on home O2, primarily uses wheelchair to ambulate.  She had a frank discussion w oncologist re her prognosis - "I am about done for."  "I am thinking about not taking chemotherapy and just giving up."  Relates multiple distressing symptoms including pain, fatigue, sore throat (which she connects w port placement yesterday), difficulty swallowing (cannot eat much and has difficulty swallowing pills).  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  Has HH PT w [facility].  Appears that Palliative Care consult was also suggested, she has not heard from Palliative Care.  She has a friend who is available to stay with her 24/7, particularly while husband works.   Overall patient is discouraged by her current situation and feels there is little that will make it better.  Her pain and inability to eat are most difficult for her, and she also struggles to find pleasurable activities due to difficulty w walking and fatigue.  She seems aware that her medical condition is grave - she is wondering about whether she should continue to pursue treatment or "give up."  She does not feel in control of any part of her situation, feels hopeless that anything she does will make any positive difference.   Messaged treatment team to follow up on Palliative Care consult and CSW will call patient for phone visit next week.  Edwyna Shell, LCSW Clinical Social  Worker Phone:  [phone]

## 2020-11-30 NOTE — Progress Notes (Signed)
  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 She had trouble breathing but was on continuous oxygen during treatment. Back pain also became an issue and ED evaluation showed disease in the L spine. Dr. Lisbeth Renshaw is considering additional radiotherapy.  Plan: The patient will receive a call in about one month from the radiation oncology department. We will be in contact after Dr. Ida Rogue evaluated her CT imaging as well if he thinks there would be a benefit of radiotherapy to the spine. She will continue follow up with Dr. [name] as well.  ________________________________________________    Carola Rhine, [facility]

## 2020-12-01 ENCOUNTER — Encounter: Payer: Self-pay | Admitting: Hematology

## 2020-12-01 ENCOUNTER — Telehealth: Payer: Self-pay | Admitting: Hematology

## 2020-12-01 ENCOUNTER — Inpatient Hospital Stay (HOSPITAL_BASED_OUTPATIENT_CLINIC_OR_DEPARTMENT_OTHER): Payer: Medicare Other | Admitting: Hematology

## 2020-12-01 ENCOUNTER — Ambulatory Visit (HOSPITAL_COMMUNITY)
Admission: RE | Admit: 2020-12-01 | Discharge: 2020-12-01 | Disposition: A | Discharge status: Home / Self Care | Payer: Medicare Other | Source: Ambulatory Visit | Attending: Hematology | Admitting: Hematology

## 2020-12-01 DIAGNOSIS — C7951 Secondary malignant neoplasm of bone: Secondary | ICD-10-CM | POA: Diagnosis not present

## 2020-12-01 DIAGNOSIS — H748X1 Other specified disorders of right middle ear and mastoid: Secondary | ICD-10-CM | POA: Diagnosis not present

## 2020-12-01 DIAGNOSIS — C3490 Malignant neoplasm of unspecified part of unspecified bronchus or lung: Secondary | ICD-10-CM | POA: Insufficient documentation

## 2020-12-01 DIAGNOSIS — C50911 Malignant neoplasm of unspecified site of right female breast: Secondary | ICD-10-CM | POA: Diagnosis not present

## 2020-12-01 MED ORDER — FENTANYL 12 MCG/HR TD PT72: 1.0000 | MEDICATED_PATCH | TRANSDERMAL | 0 refills | Status: DC

## 2020-12-01 MED ORDER — GADOBUTROL 1 MMOL/ML IV SOLN
6.5000 mL | Freq: Once | INTRAVENOUS | Status: AC | PRN
  Administered 2020-12-01: 6.5 mL via INTRAVENOUS

## 2020-12-01 NOTE — Telephone Encounter (Signed)
Scheduled follow-up appointment per 2/18 los. Patient is aware.

## 2020-12-03 ENCOUNTER — Encounter: Payer: Self-pay | Admitting: Hematology

## 2020-12-04 ENCOUNTER — Encounter: Payer: Self-pay | Admitting: Hematology

## 2020-12-04 ENCOUNTER — Other Ambulatory Visit: Payer: Self-pay

## 2020-12-04 ENCOUNTER — Inpatient Hospital Stay: Payer: Medicare Other | Admitting: Hematology

## 2020-12-04 ENCOUNTER — Inpatient Hospital Stay (HOSPITAL_BASED_OUTPATIENT_CLINIC_OR_DEPARTMENT_OTHER): Payer: Medicare Other | Admitting: Hematology

## 2020-12-04 ENCOUNTER — Inpatient Hospital Stay: Payer: Medicare Other

## 2020-12-04 VITALS — BP 141/83 | HR 114 | Resp 18

## 2020-12-04 DIAGNOSIS — C787 Secondary malignant neoplasm of liver and intrahepatic bile duct: Secondary | ICD-10-CM | POA: Diagnosis not present

## 2020-12-04 DIAGNOSIS — R0789 Other chest pain: Secondary | ICD-10-CM

## 2020-12-04 DIAGNOSIS — Z853 Personal history of malignant neoplasm of breast: Secondary | ICD-10-CM | POA: Diagnosis not present

## 2020-12-04 DIAGNOSIS — E860 Dehydration: Secondary | ICD-10-CM

## 2020-12-04 DIAGNOSIS — C3490 Malignant neoplasm of unspecified part of unspecified bronchus or lung: Secondary | ICD-10-CM

## 2020-12-04 DIAGNOSIS — Z7189 Other specified counseling: Secondary | ICD-10-CM

## 2020-12-04 DIAGNOSIS — Z923 Personal history of irradiation: Secondary | ICD-10-CM | POA: Diagnosis not present

## 2020-12-04 DIAGNOSIS — Z79811 Long term (current) use of aromatase inhibitors: Secondary | ICD-10-CM | POA: Diagnosis not present

## 2020-12-04 DIAGNOSIS — R000 Tachycardia, unspecified: Secondary | ICD-10-CM | POA: Diagnosis not present

## 2020-12-04 DIAGNOSIS — C50411 Malignant neoplasm of upper-outer quadrant of right female breast: Secondary | ICD-10-CM | POA: Diagnosis not present

## 2020-12-04 DIAGNOSIS — Z95828 Presence of other vascular implants and grafts: Secondary | ICD-10-CM | POA: Insufficient documentation

## 2020-12-04 DIAGNOSIS — M1990 Unspecified osteoarthritis, unspecified site: Secondary | ICD-10-CM | POA: Diagnosis not present

## 2020-12-04 DIAGNOSIS — C7800 Secondary malignant neoplasm of unspecified lung: Secondary | ICD-10-CM | POA: Diagnosis not present

## 2020-12-04 DIAGNOSIS — Z5111 Encounter for antineoplastic chemotherapy: Secondary | ICD-10-CM | POA: Diagnosis not present

## 2020-12-04 DIAGNOSIS — Z170 Estrogen receptor positive status [ER+]: Secondary | ICD-10-CM | POA: Diagnosis not present

## 2020-12-04 DIAGNOSIS — M8580 Other specified disorders of bone density and structure, unspecified site: Secondary | ICD-10-CM | POA: Diagnosis not present

## 2020-12-04 DIAGNOSIS — I10 Essential (primary) hypertension: Secondary | ICD-10-CM | POA: Diagnosis not present

## 2020-12-04 LAB — CBC WITH DIFFERENTIAL (CANCER CENTER ONLY)
Abs Immature Granulocytes: 0.08 10*3/uL — ABNORMAL HIGH (ref 0.00–0.07)
Basophils Absolute: 0 10*3/uL (ref 0.0–0.1)
Basophils Relative: 2 %
Eosinophils Absolute: 0 10*3/uL (ref 0.0–0.5)
Eosinophils Relative: 2 %
HCT: 28 % — ABNORMAL LOW (ref 36.0–46.0)
Hemoglobin: 9.4 g/dL — ABNORMAL LOW (ref 12.0–15.0)
Immature Granulocytes: 6 %
Lymphocytes Relative: 11 %
Lymphs Abs: 0.1 10*3/uL — ABNORMAL LOW (ref 0.7–4.0)
MCH: 27.8 pg (ref 26.0–34.0)
MCHC: 33.6 g/dL (ref 30.0–36.0)
MCV: 82.8 fL (ref 80.0–100.0)
Monocytes Absolute: 0.2 10*3/uL (ref 0.1–1.0)
Monocytes Relative: 18 %
Neutro Abs: 0.8 10*3/uL — ABNORMAL LOW (ref 1.7–7.7)
Neutrophils Relative %: 61 %
Platelet Count: 60 10*3/uL — ABNORMAL LOW (ref 150–400)
RBC: 3.38 MIL/uL — ABNORMAL LOW (ref 3.87–5.11)
RDW: 13.2 % (ref 11.5–15.5)
WBC Count: 1.3 10*3/uL — ABNORMAL LOW (ref 4.0–10.5)
nRBC: 0 % (ref 0.0–0.2)

## 2020-12-04 LAB — CMP (CANCER CENTER ONLY)
ALT: 248 U/L — ABNORMAL HIGH (ref 0–44)
AST: 203 U/L (ref 15–41)
Albumin: 2.5 g/dL — ABNORMAL LOW (ref 3.5–5.0)
Alkaline Phosphatase: 314 U/L — ABNORMAL HIGH (ref 38–126)
Anion gap: 12 (ref 5–15)
BUN: 17 mg/dL (ref 8–23)
CO2: 19 mmol/L — ABNORMAL LOW (ref 22–32)
Calcium: 8.7 mg/dL — ABNORMAL LOW (ref 8.9–10.3)
Chloride: 104 mmol/L (ref 98–111)
Creatinine: 0.73 mg/dL (ref 0.44–1.00)
GFR, Estimated: >60 mL/min (ref >60)
Glucose, Bld: 96 mg/dL (ref 70–99)
Potassium: 3.5 mmol/L (ref 3.5–5.1)
Sodium: 135 mmol/L (ref 135–145)
Total Bilirubin: 0.7 mg/dL (ref 0.3–1.2)
Total Protein: 6.7 g/dL (ref 6.5–8.1)

## 2020-12-04 LAB — TSH: TSH: 15.84 u[IU]/mL — ABNORMAL HIGH (ref 0.308–3.960)

## 2020-12-04 LAB — MAGNESIUM: Magnesium: 1.9 mg/dL (ref 1.7–2.4)

## 2020-12-04 MED ORDER — SODIUM CHLORIDE 0.9 % IV SOLN
Freq: Once | INTRAVENOUS | Status: AC
  Filled 2020-12-04: qty 250

## 2020-12-04 MED ORDER — MORPHINE SULFATE (PF) 2 MG/ML IV SOLN
INTRAVENOUS | Status: AC
  Filled 2020-12-04: qty 1

## 2020-12-04 MED ORDER — SODIUM CHLORIDE 0.9% FLUSH
10.0000 mL | Freq: Once | INTRAVENOUS | Status: AC
  Administered 2020-12-04: 10 mL
  Filled 2020-12-04: qty 10

## 2020-12-04 MED ORDER — MORPHINE SULFATE (PF) 2 MG/ML IV SOLN
2.0000 mg | Freq: Once | INTRAVENOUS | Status: AC
  Administered 2020-12-04: 2 mg via INTRAVENOUS

## 2020-12-04 NOTE — Progress Notes (Signed)
Critical Value AST 203 Dr [name] and Sandi Mealy PA-C notified

## 2020-12-04 NOTE — Progress Notes (Signed)
[place]   [phone] [phone]   Clinic Follow up Note   Patient Care Team: [name], [name] [name], MD as PCP - General (Internal Medicine) [name], MD as Consulting Physician (Hematology) [name], MD (Inactive) as Consulting Physician (General Surgery) [name], NP as Nurse Practitioner (Nurse Practitioner) [name], MD as Consulting Physician (Plastic Surgery) [name], MD as Consulting Physician (Orthopedic Surgery)  Date of Service:  12/04/2020  CHIEF COMPLAINT: Odynophagia    SUMMARY OF ONCOLOGIC HISTORY: 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 [facility]) Staging form: Breast, AJCC 7th Edition - Clinical: Stage IIA (T2, N0, M0) - Signed by [name], MD on 02/08/2015 Laterality: Right Estrogen receptor status: Positive Progesterone receptor status: Positive HER2 status: Negative - Pathologic stage from 03/23/2015: Stage Unknown (T2, NX, cM0) - Unsigned  Small cell lung cancer ([place]) Staging form: Lung, AJCC 8th Edition - Clinical stage from 11/13/2020: Stage IV (cTX, cN3, cM1) - Signed by [name], MD on 11/16/2020 Stage prefix: Initial diagnosis     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) and adjuvant radiation ([name]).   12/13/2014 Mammogram   Right breast: possible asymmetry warranting further evaluation with spot compression views and possibly ultrasound   12/16/2014 Breast US   Right breast: no definitive abnormality within the superior aspect of the right breast to correspond with the mammographic finding.   01/10/2015 Breast MRI   Right breast: irregular rim enhancing mass within the  superior right breast at 11:30 measuring 2.5 x 2.4 x 2.4 cm. No abnormal enhancement is identified within the skin of the superior right breast in the area of concern on mammogram.   01/12/2015 Breast US   Second look diagnostic mammogram and ultrasound showed a 2.3 cm mass in the upper midline right breast. No axillary adenopathy.   01/12/2015 Initial Biopsy   Right breast needle core bx (upper midline): Invasive ductal carcinoma, grade 2, ER+ (100%), PR+ (77%), HER2/neu negative (ratio 1.15), Ki67 20%    01/30/2015 Procedure   Breast High/Moderate Risk panel ([facility]) reveals no clinically significant variant at ATM, BRCA1, BRCA2, CDH1, CHEK2, PALB2, PTEN, STK11, and TP53.   02/08/2015 Clinical Stage   Stage IIA (T2 N0)   03/23/2015 Definitive Surgery   Right mastectomy ([name]): invasive adenocarcinoma, grade 3, 2.9 cm, HER2/neu repeated, negative (ratio 1.23)   03/23/2015 Oncotype testing   Score: 8 (6% ROR). No chemotherapy [name]).   03/23/2015 Pathologic Stage   Stage IIA: pT2 pNx   05/04/2015 -  Anti-estrogen oral therapy   Letrozole 2.48m daily. Planned duration of therapy at least 5 years.    07/25/2015 Survivorship   Survivorship visit completed and copy of care plan provided to patient.   12/15/2015 Mammogram   IMPRESSION: No mammographic evidence of malignancy. A result letter of this screening mammogram will be mailed directly to the patient.   12/23/2016 Mammogram   IMPRESSION: No mammographic evidence of malignancy. A result letter of this screening mammogram will be mailed directly to the patient.    12/30/2017 Mammogram   12/30/2017 Mammogram IMPRESSION: No mammographic evidence of malignancy. A result letter of this screening mammogram will be mailed directly to the patient.   03/27/2020 Mammogram   IMPRESSION: No  mammographic evidence of malignancy. A result letter of this screening mammogram will be mailed directly to the patient.   10/26/2020 Imaging   CT  chest IMPRESSION: 1. Bulky mediastinal, thoracic inlet, right supraclavicular, and left axillary lymphadenopathy consistent with metastatic disease. Associated numerous bilateral pulmonary nodules and masses, most prominently in the right middle lobe with dominant 4.1 cm peripheral mass lesion. 2. 16 mm rim enhancing lesion posterior right liver consistent with metastatic involvement. 3. 13 mm hypoenhancing lesion in the tail of pancreas. Primary neoplasm or metastatic involvement could have this appearance. 4. Multiple nodules in the upper abdomen concerning for peritoneal/mesenteric metastatic disease. 5. Small right and tiny left pleural effusions with bibasilar atelectasis. 6. Aortic Atherosclerosis (ICD10-I70.0).   11/10/2020 PET scan   IMPRESSION: 1. Extensive hypermetabolic multi organ metastatic disease. 2. Bulky intensely hypermetabolic RIGHT supraclavicular, mediastinal and LEFT axillary adenopathy. 3. Hypermetabolic pulmonary mass in the RIGHT upper lobe with additional hypermetabolic nodules. 4. Hypermetabolic solitary hepatic metastasis. 5. Hypermetabolic lesion within the pancreas is also favored metastatic lesion. 6. Multifocal hypermetabolic skeletal metastasis. 7. Bilateral pleural effusions.   11/13/2020 Procedure   Thoracentesis  IMPRESSION: Successful ultrasound guided right thoracentesis yielding 200 mL of pleural fluid.   Small cell lung cancer ([place])  11/13/2020 Pathology Results   Right supraclavicular LN Biopsy  FINAL MICROSCOPIC DIAGNOSIS:   A. LYMPH NODE, RIGHT SUPRACLAVICULAR, NEEDLE CORE BIOPSY:  - Most consistent with small cell carcinoma.   COMMENT:   Tumor is limited in quantity, and partially necrotic.  TTF-1 and  Synaptophysin are positive.  Ki-67 proliferation index reaches 80-90%.  GATA-3, ER and p40 are negative.  The morphologic and immunophenotypic  characteristics are most suggestive of small cell carcinoma.  TTF-1  positive  staining is compatible with origin from the lung; however, it  does not exclude origin from other entities.  Please note that distinct  nodal tissue is not identified in the submitted material.  Results  reported to Dr. [name] on 11/16/2020.  Dr. Melina Copa reviewed the case   11/13/2020 Cancer Staging   Staging form: Lung, AJCC 8th Edition - Clinical stage from 11/13/2020: Stage IV (cTX, cN3, cM1) - Signed by [name], MD on 11/16/2020 Stage prefix: Initial diagnosis   11/13/2020 - 11/28/2020 Radiation Therapy   palliative radiation to [facility] with Dr Lisbeth Renshaw 11/13/20-11/28/20   11/16/2020 Initial Diagnosis   Small cell lung cancer ([place])   11/22/2020 -  Chemotherapy   First line etoposide D1-3, carboplatin and atezolizumab D1 q3weeks beginning 11/22/20.        11/29/2020 Procedure   PAC placed on 11/29/20      CURRENT THERAPY:  First lineetoposideD1-3,carboplatin and atezolizumabD1 q3weeks beginning 11/22/20.  INTERVAL HISTORY:  Tara Rodgers is here for odynophagia. Started in the last 2 days. She took 1 dose of sulcrafate which helped. She took only 1 pain pill and has hycet which she is taking about 5 times a day. Overall this is not enough for pain control. She has not needed as much supplemental oxygen lately. She got her lidocaine refill but was not able to pick up fentanyl patch. Her back pain is controlled with lidocaine patch now.   REVIEW OF SYSTEMS:   Constitutional: Denies fevers, chills or abnormal weight loss Eyes: Denies blurriness of vision Ears, nose, mouth, throat, and face: Denies mucositis or sore throat Respiratory: Denies cough, dyspnea or wheezes (+) Odynophagia  Cardiovascular: Denies palpitation, chest discomfort or lower extremity swelling Gastrointestinal:  Denies nausea, heartburn or change in  bowel habits Skin: Denies abnormal skin rashes Lymphatics: Denies new lymphadenopathy or easy bruising Neurological:Denies numbness, tingling or new  weaknesses Behavioral/Psych: Mood is stable, no new changes  All other systems were reviewed with the patient and are negative.  MEDICAL HISTORY:  Past Medical History:  Diagnosis Date  . Anxiety   . Asthma    does not take any medications, hx of inhaler use  . Breast cancer [facility]) 1995/2016   right sided breast cancer in 08/1994  . Bronchitis    hx of  . GERD (gastroesophageal reflux disease)   . History of hiatal hernia   . Hypertension   . Hyperthyroidism   . Lung cancer ([place]) 11/02/2020    SURGICAL HISTORY: Past Surgical History:  Procedure Laterality Date  . BREAST BIOPSY Right 2016   malignant  . BREAST EXCISIONAL BIOPSY Left 1996   benign  . Carpel Tunnel  Bilateral   . INCONTINENCE SURGERY     with mesh insertion about 7 years ago  . IR IMAGING GUIDED PORT INSERTION  11/29/2020  . LATISSIMUS FLAP TO BREAST Right 03/23/2015   Procedure: LATISSIMUS FLAP TO BREAST WITH PLACEMENT OF IMPLANT FOR BREAST RECONSTRUCTION;  Surgeon: [name], MD;  Location: [facility] [place];  Service: Plastics;  Laterality: Right;  . MASTECTOMY Right 03/23/2015  . RECONSTRUCTION BREAST W/ LATISSIMUS DORSI FLAP Right 03/23/2015  . TONSILLECTOMY     as a child  . TOTAL KNEE ARTHROPLASTY Right 03/13/2018   Procedure: RIGHT TOTAL KNEE ARTHROPLASTY;  Surgeon: Sydnee Cabal, MD;  Location: [facility];  Service: Orthopedics;  Laterality: Right;  Adductor Block  . TOTAL MASTECTOMY Right 03/23/2015   Procedure: RIGHT TOTAL MASTECTOMY;  Surgeon: [name], MD;  Location: [place] [facility];  Service: General;  Laterality: Right;    I have reviewed the social history and family history with the patient and they are unchanged from previous note.  ALLERGIES:  is allergic to fosamax [alendronate sodium].  MEDICATIONS:  Current Outpatient Medications  Medication Sig Dispense Refill  . allopurinol (ZYLOPRIM) 300 MG tablet Take 1 tablet (300 mg total) by mouth daily. 30 tablet 1  . ALPRAZolam (XANAX) 0.5 MG tablet Take 1  tablet by mouth twice daily as needed (Patient taking differently: Take 0.5 mg by mouth 2 (two) times daily as needed for anxiety.) 180 tablet 0  . cyclobenzaprine (FLEXERIL) 10 MG tablet Take 1 tablet (10 mg total) by mouth 3 (three) times daily as needed for muscle spasms. 30 tablet 2  . esomeprazole (NEXIUM) 40 MG capsule One po daily at bedtime (Patient taking differently: Take 40 mg by mouth daily.) 90 capsule 1  . EUTHYROX 75 MCG tablet Take 1 tablet by mouth once daily (Patient taking differently: Take 75 mcg by mouth daily before breakfast.) 90 tablet 0  . fentaNYL (DURAGESIC) 12 MCG/HR Place 1 patch onto the skin every 3 (three) days. 5 patch 0  . HYDROcodone-acetaminophen (HYCET) 7.5-325 mg/15 ml solution Take 10-15 mLs by mouth every 4 (four) hours as needed for moderate pain or severe pain. 473 mL 0  . letrozole (FEMARA) 2.5 MG tablet Take 1 tablet (2.5 mg total) by mouth daily. 90 tablet 3  . lidocaine (LIDODERM) 5 % Place 1 patch onto the skin daily. Remove & Discard patch within 12 hours or as directed by MD 30 patch 0  . meloxicam (MOBIC) 7.5 MG tablet Take 7.5 mg by mouth 2 (two) times daily as needed for pain.    . metoprolol succinate (TOPROL-XL) 50 MG  24 hr tablet Take 1 tablet by mouth twice daily (Patient taking differently: Take 50 mg by mouth 2 (two) times daily.) 60 tablet 1  . ondansetron (ZOFRAN) 8 MG tablet Take 1 tablet (8 mg total) by mouth 2 (two) times daily as needed for refractory nausea / vomiting. Start on day 3 after carboplatin chemo. 30 tablet 1  . prochlorperazine (COMPAZINE) 10 MG tablet Take 1 tablet (10 mg total) by mouth every 6 (six) hours as needed (Nausea or vomiting). 30 tablet 1  . senna-docusate (SENOKOT-S) 8.6-50 MG tablet Take 1 tablet by mouth daily. 30 tablet 0  . simvastatin (ZOCOR) 40 MG tablet TAKE 1 TABLET BY MOUTH AT BEDTIME (Patient taking differently: Take by mouth daily at 6 (six) AM.) 90 tablet 0  . sucralfate (CARAFATE) 1 g tablet Take 1  tablet (1 g total) by mouth 4 (four) times daily. Dissolve each tablet in 15 cc water before use. 120 tablet 2  . verapamil (CALAN-SR) 240 MG CR tablet Take 1 tablet by mouth once daily (Patient taking differently: Take 240 mg by mouth daily.) 90 tablet 0   No current facility-administered medications for this visit.    PHYSICAL EXAMINATION: ECOG PERFORMANCE STATUS: 3 - Symptomatic, >50% confined to bed  Vitals:   12/04/20 1716  BP: (!) 141/83  Pulse: (!) 114  Resp: 18  SpO2: 93%   There were no vitals filed for this visit.  GENERAL:alert, no distress and comfortable SKIN: skin color, texture, turgor are normal, no rashes or significant lesions EYES: normal, Conjunctiva are pink and non-injected, sclera clear  NECK: supple, thyroid normal size, non-tender, without nodularity LYMPH:  no palpable lymphadenopathy in the cervical, axillary  LUNGS: clear to auscultation and percussion with normal breathing effort HEART: regular rate & rhythm and no murmurs and no lower extremity edema ABDOMEN:abdomen soft, non-tender and normal bowel sounds Musculoskeletal:no cyanosis of digits and no clubbing  NEURO: alert & oriented x 3 with fluent speech, no focal motor/sensory deficits  LABORATORY DATA:  I have reviewed the data as listed CBC Latest Ref Rng & Units 12/04/2020 11/28/2020 11/27/2020  WBC 4.0 - 10.5 K/uL 1.3(L) 4.3 5.1  Hemoglobin 12.0 - 15.0 g/dL 9.4(L) 11.8(L) 11.7(L)  Hematocrit 36.0 - 46.0 % 28.0(L) 37.1 35.4(L)  Platelets 150 - 400 K/uL 60(L) 293 328     CMP Latest Ref Rng & Units 12/04/2020 11/28/2020 11/27/2020  Glucose 70 - 99 mg/dL 96 100(H) 94  BUN 8 - 23 mg/dL '17 15 14  ' Creatinine 0.44 - 1.00 mg/dL 0.73 0.78 0.77  Sodium 135 - 145 mmol/L 135 135 135  Potassium 3.5 - 5.1 mmol/L 3.5 4.0 4.2  Chloride 98 - 111 mmol/L 104 101 102  CO2 22 - 32 mmol/L 19(L) 23 23  Calcium 8.9 - 10.3 mg/dL 8.7(L) 8.8(L) 8.8(L)  Total Protein 6.5 - 8.1 g/dL 6.7 6.9 6.9  Total Bilirubin 0.3 -  1.2 mg/dL 0.7 0.7 0.7  Alkaline Phos 38 - 126 U/L 314(H) 95 101  AST 15 - 41 U/L 203(HH) 53(H) 48(H)  ALT 0 - 44 U/L 248(H) 74(H) 59(H)      RADIOGRAPHIC STUDIES: I have personally reviewed the radiological images as listed and agreed with the findings in the report. No results found.   ASSESSMENT & PLAN:  Tara Rodgers is a 75 y.o. female with    1. 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  -She used 1 dose of sulcrafate which  helped. I recommend she takes 4 times a day, 30 min before each meal -She has hycet but not taking as frequently as she nees. I recommend she take more regularly to better control her pain and help he eat better. She and her care taker voiced good understanding.  -I recommend soft foot bland diet while she is healing from Radiation.  -Labs reviewed. Given low platelets, will watch for bleeding. I discussed her blood counts are low from chemo. -She is breathing better with less oxygen this week.  -Her back pain is controled on lidocaine patch alone as she was not able to pick up her fentanyl patch. She is fine to continue lidocaine patch alone for now.  -Per request I will refer for speech therapist  -Will proceed with IV Fluids today    2. Bone Mets I discussed her 12/01/20 MRI brain which showed no brain mets, but does have bone metastasis in her right skull base. I reviewed with patient today. She has no head pain currently. If pain presents she may have palliative RT.   3. Metastatic small cell lung cancer -Status post palliative radiation and for cycle chemotherapy dose reduction -Her oxygen requirement has decreased lately, the palpable right supraclavicular lymph node has also decreased in size, she is likely responding to treatment. -Overall very frail, continue supportive care  4. DNR   PLAN -1L IV fluids today with iv morphone 19m  -add IVF 2/23 -Lab, flush, F/u and IVF  scheduled on 2/25.     No problem-specific Assessment & Plan notes found for this encounter.   No orders of the defined types were placed in this encounter.  All questions were answered. The patient knows to call the clinic with any problems, questions or concerns. No barriers to learning was detected. The total time spent in the appointment was 20 minutes.     YTruitt Merle MD 12/04/2020   I, AJoslyn Devon am acting as scribe for YTruitt Merle MD.   I have reviewed the above documentation for accuracy and completeness, and I agree with the above.

## 2020-12-04 NOTE — Patient Instructions (Signed)
Rehydration, Adult Rehydration is the replacement of body fluids, salts, and minerals (electrolytes) that are lost during dehydration. Dehydration is when there is not enough water or other fluids in the body. This happens when you lose more fluids than you take in. Common causes of dehydration include:  Not drinking enough fluids. This can occur when you are ill or doing activities that require a lot of energy, especially in hot weather.  Conditions that cause loss of water or other fluids, such as diarrhea, vomiting, sweating, or urinating a lot.  Other illnesses, such as fever or infection.  Certain medicines, such as those that remove excess fluid from the body (diuretics). Symptoms of mild or moderate dehydration may include thirst, dry lips and mouth, and dizziness. Symptoms of severe dehydration may include increased heart rate, confusion, fainting, and not urinating. For severe dehydration, you may need to get fluids through an IV at the hospital. For mild or moderate dehydration, you can usually rehydrate at home by drinking certain fluids as told by your health care provider. What are the risks? Generally, rehydration is safe. However, taking in too much fluid (overhydration) can be a problem. This is rare. Overhydration can cause an electrolyte imbalance, kidney failure, or a decrease in salt (sodium) levels in the body. Supplies needed You will need an oral rehydration solution (ORS) if your health care provider tells you to use one. This is a drink to treat dehydration. It can be found in pharmacies and retail stores. How to rehydrate Fluids Follow instructions from your health care provider for rehydration. The kind of fluid and the amount you should drink depend on your condition. In general, you should choose drinks that you prefer.  If told by your health care provider, drink an ORS. ? Make an ORS by following instructions on the package. ? Start by drinking small amounts,  about  cup (120 mL) every 5-10 minutes. ? Slowly increase how much you drink until you have taken the amount recommended by your health care provider.  Drink enough clear fluids to keep your urine pale yellow. If you were told to drink an ORS, finish it first, then start slowly drinking other clear fluids. Drink fluids such as: ? Water. This includes sparkling water and flavored water. Drinking only water can lead to having too little sodium in your body (hyponatremia). Follow the advice of your health care provider. ? Water from ice chips you suck on. ? Fruit juice with water you add to it (diluted). ? Sports drinks. ? Hot or cold herbal teas. ? Broth-based soups. ? Milk or milk products. Food Follow instructions from your health care provider about what to eat while you rehydrate. Your health care provider may recommend that you slowly begin eating regular foods in small amounts.  Eat foods that contain a healthy balance of electrolytes, such as bananas, oranges, potatoes, tomatoes, and spinach.  Avoid foods that are greasy or contain a lot of sugar. In some cases, you may get nutrition through a feeding tube that is passed through your nose and into your stomach (nasogastric tube, or NG tube). This may be done if you have uncontrolled vomiting or diarrhea.   Beverages to avoid Certain beverages may make dehydration worse. While you rehydrate, avoid drinking alcohol.   How to tell if you are recovering from dehydration You may be recovering from dehydration if:  You are urinating more often than before you started rehydrating.  Your urine is pale yellow.  Your energy level   improves.  You vomit less frequently.  You have diarrhea less frequently.  Your appetite improves or returns to normal.  You feel less dizzy or less light-headed.  Your skin tone and color start to look more normal. Follow these instructions at home:  Take over-the-counter and prescription medicines only  as told by your health care provider.  Do not take sodium tablets. Doing this can lead to having too much sodium in your body (hypernatremia). Contact a health care provider if:  You continue to have symptoms of mild or moderate dehydration, such as: ? Thirst. ? Dry lips. ? Slightly dry mouth. ? Dizziness. ? Dark urine or less urine than normal. ? Muscle cramps.  You continue to vomit or have diarrhea. Get help right away if you:  Have symptoms of dehydration that get worse.  Have a fever.  Have a severe headache.  Have been vomiting and the following happens: ? Your vomiting gets worse or does not go away. ? Your vomit includes blood or green matter (bile). ? You cannot eat or drink without vomiting.  Have problems with urination or bowel movements, such as: ? Diarrhea that gets worse or does not go away. ? Blood in your stool (feces). This may cause stool to look black and tarry. ? Not urinating, or urinating only a small amount of very dark urine, within 6-8 hours.  Have trouble breathing.  Have symptoms that get worse with treatment. These symptoms may represent a serious problem that is an emergency. Do not wait to see if the symptoms will go away. Get medical help right away. Call your local emergency services (911 in the [place] Do not drive yourself to the hospital. Summary  Rehydration is the replacement of body fluids and minerals (electrolytes) that are lost during dehydration.  Follow instructions from your health care provider for rehydration. The kind of fluid and amount you should drink depend on your condition.  Slowly increase how much you drink until you have taken the amount recommended by your health care provider.  Contact your health care provider if you continue to show signs of mild or moderate dehydration. This information is not intended to replace advice given to you by your health care provider. Make sure you discuss any questions you have with  your health care provider. Document Revised: 12/01/2019 Document Reviewed: 10/11/2019 Elsevier Patient Education  2021 [employer]vier Inc.  

## 2020-12-05 ENCOUNTER — Telehealth: Payer: Self-pay | Admitting: Hematology

## 2020-12-05 NOTE — Telephone Encounter (Signed)
Checked out appointment. No LOS notes needing to be scheduled. No changes made. 

## 2020-12-06 ENCOUNTER — Other Ambulatory Visit: Payer: Self-pay

## 2020-12-06 ENCOUNTER — Inpatient Hospital Stay: Payer: Medicare Other

## 2020-12-06 VITALS — BP 168/81 | HR 113 | Temp 99.0°F | Resp 18 | Ht 59.0 in | Wt 123.3 lb

## 2020-12-06 DIAGNOSIS — Z5111 Encounter for antineoplastic chemotherapy: Secondary | ICD-10-CM | POA: Diagnosis not present

## 2020-12-06 DIAGNOSIS — C3490 Malignant neoplasm of unspecified part of unspecified bronchus or lung: Secondary | ICD-10-CM

## 2020-12-06 DIAGNOSIS — R000 Tachycardia, unspecified: Secondary | ICD-10-CM | POA: Diagnosis not present

## 2020-12-06 DIAGNOSIS — C50411 Malignant neoplasm of upper-outer quadrant of right female breast: Secondary | ICD-10-CM | POA: Diagnosis not present

## 2020-12-06 DIAGNOSIS — I10 Essential (primary) hypertension: Secondary | ICD-10-CM | POA: Diagnosis not present

## 2020-12-06 DIAGNOSIS — C7800 Secondary malignant neoplasm of unspecified lung: Secondary | ICD-10-CM | POA: Diagnosis not present

## 2020-12-06 DIAGNOSIS — M8580 Other specified disorders of bone density and structure, unspecified site: Secondary | ICD-10-CM | POA: Diagnosis not present

## 2020-12-06 DIAGNOSIS — E860 Dehydration: Secondary | ICD-10-CM

## 2020-12-06 DIAGNOSIS — Z95828 Presence of other vascular implants and grafts: Secondary | ICD-10-CM

## 2020-12-06 DIAGNOSIS — Z79811 Long term (current) use of aromatase inhibitors: Secondary | ICD-10-CM | POA: Diagnosis not present

## 2020-12-06 DIAGNOSIS — C787 Secondary malignant neoplasm of liver and intrahepatic bile duct: Secondary | ICD-10-CM | POA: Diagnosis not present

## 2020-12-06 DIAGNOSIS — Z923 Personal history of irradiation: Secondary | ICD-10-CM | POA: Diagnosis not present

## 2020-12-06 DIAGNOSIS — Z170 Estrogen receptor positive status [ER+]: Secondary | ICD-10-CM | POA: Diagnosis not present

## 2020-12-06 DIAGNOSIS — Z853 Personal history of malignant neoplasm of breast: Secondary | ICD-10-CM | POA: Diagnosis not present

## 2020-12-06 DIAGNOSIS — M1990 Unspecified osteoarthritis, unspecified site: Secondary | ICD-10-CM | POA: Diagnosis not present

## 2020-12-06 MED ORDER — HEPARIN SOD (PORK) LOCK FLUSH 100 UNIT/ML IV SOLN
500.0000 [IU] | Freq: Once | INTRAVENOUS | Status: AC
  Administered 2020-12-06: 500 [IU]
  Filled 2020-12-06: qty 5

## 2020-12-06 MED ORDER — SODIUM CHLORIDE 0.9% FLUSH
10.0000 mL | Freq: Once | INTRAVENOUS | Status: AC
  Administered 2020-12-06: 10 mL
  Filled 2020-12-06: qty 10

## 2020-12-06 MED ORDER — SODIUM CHLORIDE 0.9 % IV SOLN
Freq: Once | INTRAVENOUS | Status: AC
  Filled 2020-12-06: qty 250

## 2020-12-06 NOTE — Progress Notes (Signed)
[place]   Telephone:([phone]) [phone] Fax:(336) [phone]   Clinic Follow up Note   Patient Care Team: [name], [name] [name], MD as PCP - General (Internal Medicine) [name], MD as Consulting Physician (Hematology) [name], MD (Inactive) as Consulting Physician (General Surgery) [name], NP as Nurse Practitioner (Nurse Practitioner) [name], MD as Consulting Physician (Plastic Surgery) [name], MD as Consulting Physician (Orthopedic Surgery)  Date of Service:  12/08/2020  CHIEF COMPLAINT: Odynophagia    SUMMARY OF ONCOLOGIC HISTORY: 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 [facility]) Staging form: Breast, AJCC 7th Edition - Clinical: Stage IIA (T2, N0, M0) - Signed by [name], MD on 02/08/2015 Laterality: Right Estrogen receptor status: Positive Progesterone receptor status: Positive HER2 status: Negative - Pathologic stage from 03/23/2015: Stage Unknown (T2, NX, cM0) - Unsigned  Small cell lung cancer ([place]) Staging form: Lung, AJCC 8th Edition - Clinical stage from 11/13/2020: Stage IV (cTX, cN3, cM1) - Signed by [name], MD on 11/16/2020 Stage prefix: Initial diagnosis     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) and adjuvant radiation ([name]).   12/13/2014 Mammogram   Right breast: possible asymmetry warranting further evaluation with spot compression views and possibly ultrasound   12/16/2014 Breast US   Right breast: no definitive abnormality within the superior aspect of the right breast to correspond with the mammographic finding.   01/10/2015 Breast MRI   Right breast: irregular rim enhancing mass within the  superior right breast at 11:30 measuring 2.5 x 2.4 x 2.4 cm. No abnormal enhancement is identified within the skin of the superior right breast in the area of concern on mammogram.   01/12/2015 Breast US   Second look diagnostic mammogram and ultrasound showed a 2.3 cm mass in the upper midline right breast. No axillary adenopathy.   01/12/2015 Initial Biopsy   Right breast needle core bx (upper midline): Invasive ductal carcinoma, grade 2, ER+ (100%), PR+ (77%), HER2/neu negative (ratio 1.15), Ki67 20%    01/30/2015 Procedure   Breast High/Moderate Risk panel ([facility]) reveals no clinically significant variant at ATM, BRCA1, BRCA2, CDH1, CHEK2, PALB2, PTEN, STK11, and TP53.   02/08/2015 Clinical Stage   Stage IIA (T2 N0)   03/23/2015 Definitive Surgery   Right mastectomy ([name]): invasive adenocarcinoma, grade 3, 2.9 cm, HER2/neu repeated, negative (ratio 1.23)   03/23/2015 Oncotype testing   Score: 8 (6% ROR). No chemotherapy [name]).   03/23/2015 Pathologic Stage   Stage IIA: pT2 pNx   05/04/2015 -  Anti-estrogen oral therapy   Letrozole 2.74m daily. Planned duration of therapy at least 5 years.    07/25/2015 Survivorship   Survivorship visit completed and copy of care plan provided to patient.   12/15/2015 Mammogram   IMPRESSION: No mammographic evidence of malignancy. A result letter of this screening mammogram will be mailed directly to the patient.   12/23/2016 Mammogram   IMPRESSION: No mammographic evidence of malignancy. A result letter of this screening mammogram will be mailed directly to the patient.    12/30/2017 Mammogram   12/30/2017 Mammogram IMPRESSION: No mammographic evidence of malignancy. A result letter of this screening mammogram will be mailed directly to the patient.   03/27/2020 Mammogram   IMPRESSION: No  mammographic evidence of malignancy. A result letter of this screening mammogram will be mailed directly to the patient.   10/26/2020 Imaging   CT  chest IMPRESSION: 1. Bulky mediastinal, thoracic inlet, right supraclavicular, and left axillary lymphadenopathy consistent with metastatic disease. Associated numerous bilateral pulmonary nodules and masses, most prominently in the right middle lobe with dominant 4.1 cm peripheral mass lesion. 2. 16 mm rim enhancing lesion posterior right liver consistent with metastatic involvement. 3. 13 mm hypoenhancing lesion in the tail of pancreas. Primary neoplasm or metastatic involvement could have this appearance. 4. Multiple nodules in the upper abdomen concerning for peritoneal/mesenteric metastatic disease. 5. Small right and tiny left pleural effusions with bibasilar atelectasis. 6. Aortic Atherosclerosis (ICD10-I70.0).   11/10/2020 PET scan   IMPRESSION: 1. Extensive hypermetabolic multi organ metastatic disease. 2. Bulky intensely hypermetabolic RIGHT supraclavicular, mediastinal and LEFT axillary adenopathy. 3. Hypermetabolic pulmonary mass in the RIGHT upper lobe with additional hypermetabolic nodules. 4. Hypermetabolic solitary hepatic metastasis. 5. Hypermetabolic lesion within the pancreas is also favored metastatic lesion. 6. Multifocal hypermetabolic skeletal metastasis. 7. Bilateral pleural effusions.   11/13/2020 Procedure   Thoracentesis  IMPRESSION: Successful ultrasound guided right thoracentesis yielding 200 mL of pleural fluid.   Small cell lung cancer ([place])  11/13/2020 Pathology Results   Right supraclavicular LN Biopsy  FINAL MICROSCOPIC DIAGNOSIS:   A. LYMPH NODE, RIGHT SUPRACLAVICULAR, NEEDLE CORE BIOPSY:  - Most consistent with small cell carcinoma.   COMMENT:   Tumor is limited in quantity, and partially necrotic.  TTF-1 and  Synaptophysin are positive.  Ki-67 proliferation index reaches 80-90%.  GATA-3, ER and p40 are negative.  The morphologic and immunophenotypic  characteristics are most suggestive of small cell carcinoma.  TTF-1  positive  staining is compatible with origin from the lung; however, it  does not exclude origin from other entities.  Please note that distinct  nodal tissue is not identified in the submitted material.  Results  reported to Dr. [name] on 11/16/2020.  Dr. Melina Copa reviewed the case   11/13/2020 Cancer Staging   Staging form: Lung, AJCC 8th Edition - Clinical stage from 11/13/2020: Stage IV (cTX, cN3, cM1) - Signed by [name], MD on 11/16/2020 Stage prefix: Initial diagnosis   11/13/2020 - 11/28/2020 Radiation Therapy   palliative radiation to [facility] with Dr Lisbeth Renshaw 11/13/20-11/28/20   11/16/2020 Initial Diagnosis   Small cell lung cancer ([place])   11/22/2020 -  Chemotherapy   First line etoposide D1-3, carboplatin and atezolizumab D1 q3weeks beginning 11/22/20.        11/29/2020 Procedure   PAC placed on 11/29/20      CURRENT THERAPY:  First lineetoposideD1-3,carboplatin and atezolizumabD1 q3weeks beginning 11/22/20.  INTERVAL HISTORY:  BETTE BRIENZA is here for a follow up. She presents to the clinic with [place] husband. She notes less mid chest pain and still using sacralfate QID. This has helped her. She uses Hycet QID. She tries to eat and able to eat. She notes she has been able to keep down regular soft foods, but tries to avoid acidic or citric foods. She notes her breathing is fair. She is not using much oxygen now. She notes she cannot fully walk yet. She started PT yesterday. Her back pain is stable on Lidocaine patch. She did not pick up Fentanyl patch. She feels she wants to continue with chemo. She is willing to take a longer break before restarting. Her husband agrees. She notes she is able to get 3-4 cups of water.  REVIEW OF SYSTEMS:   Constitutional: Denies fevers, chills or abnormal weight loss Eyes: Denies blurriness of vision Ears, nose, mouth, throat, and face: Denies mucositis or sore throat Respiratory: Denies cough, dyspnea or wheezes Cardiovascular: Denies palpitation,  chest discomfort or lower extremity swelling Gastrointestinal:  Denies nausea, heartburn or change in bowel habits Skin: Denies abnormal skin rashes Lymphatics: Denies new lymphadenopathy or easy bruising Neurological:Denies numbness, tingling or new weaknesses Behavioral/Psych: Mood is stable, no new changes  All other systems were reviewed with the patient and are negative.  MEDICAL HISTORY:  Past Medical History:  Diagnosis Date  . Anxiety   . Asthma    does not take any medications, hx of inhaler use  . Breast cancer [facility]) 1995/2016   right sided breast cancer in 08/1994  . Bronchitis    hx of  . GERD (gastroesophageal reflux disease)   . History of hiatal hernia   . Hypertension   . Hyperthyroidism   . Lung cancer ([place]) 11/02/2020    SURGICAL HISTORY: Past Surgical History:  Procedure Laterality Date  . BREAST BIOPSY Right 2016   malignant  . BREAST EXCISIONAL BIOPSY Left 1996   benign  . Carpel Tunnel  Bilateral   . INCONTINENCE SURGERY     with mesh insertion about 7 years ago  . IR IMAGING GUIDED PORT INSERTION  11/29/2020  . LATISSIMUS FLAP TO BREAST Right 03/23/2015   Procedure: LATISSIMUS FLAP TO BREAST WITH PLACEMENT OF IMPLANT FOR BREAST RECONSTRUCTION;  Surgeon: [name], MD;  Location: [facility];  Service: Plastics;  Laterality: Right;  . MASTECTOMY Right 03/23/2015  . RECONSTRUCTION BREAST W/ LATISSIMUS DORSI FLAP Right 03/23/2015  . TONSILLECTOMY     as a child  . TOTAL KNEE ARTHROPLASTY Right 03/13/2018   Procedure: RIGHT TOTAL KNEE ARTHROPLASTY;  Surgeon: Sydnee Cabal, MD;  Location: [facility];  Service: Orthopedics;  Laterality: Right;  Adductor Block  . TOTAL MASTECTOMY Right 03/23/2015   Procedure: RIGHT TOTAL MASTECTOMY;  Surgeon: [name], MD;  Location: [facility];  Service: General;  Laterality: Right;    I have reviewed the social history and family history with the patient and they are unchanged from previous note.  ALLERGIES:  is allergic  to fosamax [alendronate sodium].  MEDICATIONS:  Current Outpatient Medications  Medication Sig Dispense Refill  . allopurinol (ZYLOPRIM) 300 MG tablet Take 1 tablet (300 mg total) by mouth daily. 30 tablet 1  . ALPRAZolam (XANAX) 0.5 MG tablet Take 1 tablet by mouth twice daily as needed (Patient taking differently: Take 0.5 mg by mouth 2 (two) times daily as needed for anxiety.) 180 tablet 0  . cyclobenzaprine (FLEXERIL) 10 MG tablet Take 1 tablet (10 mg total) by mouth 3 (three) times daily as needed for muscle spasms. 30 tablet 2  . esomeprazole (NEXIUM) 40 MG capsule One po daily at bedtime (Patient taking differently: Take 40 mg by mouth daily.) 90 capsule 1  . EUTHYROX 75 MCG tablet Take 1 tablet by mouth once daily (Patient taking differently: Take 75 mcg by mouth daily before breakfast.) 90 tablet 0  . fentaNYL (DURAGESIC) 12 MCG/HR Place 1 patch onto the skin every 3 (three) days. 5 patch 0  . HYDROcodone-acetaminophen (HYCET) 7.5-325 mg/15 ml solution Take 10-15 mLs by mouth every 4 (four) hours as needed for moderate pain or severe pain. 473 mL 0  . letrozole (FEMARA) 2.5 MG tablet Take 1 tablet (2.5 mg total) by mouth daily. 90 tablet 3  . lidocaine (LIDODERM) 5 %  Place 1 patch onto the skin daily. Remove & Discard patch within 12 hours or as directed by MD 30 patch 0  . meloxicam (MOBIC) 7.5 MG tablet Take 7.5 mg by mouth 2 (two) times daily as needed for pain.    . metoprolol succinate (TOPROL-XL) 50 MG 24 hr tablet Take 1 tablet by mouth twice daily (Patient taking differently: Take 50 mg by mouth 2 (two) times daily.) 60 tablet 1  . ondansetron (ZOFRAN) 8 MG tablet Take 1 tablet (8 mg total) by mouth 2 (two) times daily as needed for refractory nausea / vomiting. Start on day 3 after carboplatin chemo. 30 tablet 1  . prochlorperazine (COMPAZINE) 10 MG tablet Take 1 tablet (10 mg total) by mouth every 6 (six) hours as needed (Nausea or vomiting). 30 tablet 1  . senna-docusate  (SENOKOT-S) 8.6-50 MG tablet Take 1 tablet by mouth daily. 30 tablet 0  . simvastatin (ZOCOR) 40 MG tablet TAKE 1 TABLET BY MOUTH AT BEDTIME (Patient taking differently: Take by mouth daily at 6 (six) AM.) 90 tablet 0  . sucralfate (CARAFATE) 1 g tablet Take 1 tablet (1 g total) by mouth 4 (four) times daily. Dissolve each tablet in 15 cc water before use. 120 tablet 2  . verapamil (CALAN-SR) 240 MG CR tablet Take 1 tablet by mouth once daily (Patient taking differently: Take 240 mg by mouth daily.) 90 tablet 0   Current Facility-Administered Medications  Medication Dose Route Frequency Provider Last Rate Last Admin  . 0.9 %  sodium chloride infusion   Intravenous Continuous [name], MD 750 mL/hr at 12/08/20 1507 New Bag at 12/08/20 1507    PHYSICAL EXAMINATION: ECOG PERFORMANCE STATUS: 3 - Symptomatic, >50% confined to bed  Vitals:   12/08/20 1429  BP: 132/81  Pulse: (!) 120  Resp: 19  Temp: 99.8 F (37.7 C)  SpO2: 100%   Filed Weights   12/08/20 1429  Weight: 122 lb 4.8 oz (55.5 kg)    GENERAL:alert, no distress and comfortable SKIN: skin color, texture, turgor are normal, no rashes or significant lesions EYES: normal, Conjunctiva are pink and non-injected, sclera clear  NECK: supple, thyroid normal size, non-tender, without nodularity LYMPH:  no palpable lymphadenopathy in the cervical, axillary  LUNGS: clear to auscultation and percussion with normal breathing effort (+) Pleural effusion of left lung with decreased breath sounds  HEART: regular rate & rhythm and no murmurs (+) lower extremity edema ABDOMEN:abdomen soft, non-tender and normal bowel sounds Musculoskeletal:no cyanosis of digits and no clubbing  NEURO: alert & oriented x 3 with fluent speech, no focal motor/sensory deficits Exam performed in wheelchair today  LABORATORY DATA:  I have reviewed the data as listed CBC Latest Ref Rng & Units 12/08/2020 12/04/2020 11/28/2020  WBC 4.0 - 10.5 K/uL 3.3(L) 1.3(L) 4.3   Hemoglobin 12.0 - 15.0 g/dL 9.7(L) 9.4(L) 11.8(L)  Hematocrit 36.0 - 46.0 % 29.8(L) 28.0(L) 37.1  Platelets 150 - 400 K/uL 139(L) 60(L) 293     CMP Latest Ref Rng & Units 12/08/2020 12/04/2020 11/28/2020  Glucose 70 - 99 mg/dL 94 96 100(H)  BUN 8 - 23 mg/dL '12 17 15  ' Creatinine 0.44 - 1.00 mg/dL 0.75 0.73 0.78  Sodium 135 - 145 mmol/L 135 135 135  Potassium 3.5 - 5.1 mmol/L 2.8(L) 3.5 4.0  Chloride 98 - 111 mmol/L 104 104 101  CO2 22 - 32 mmol/L 19(L) 19(L) 23  Calcium 8.9 - 10.3 mg/dL 8.5(L) 8.7(L) 8.8(L)  Total Protein 6.5 - 8.1  g/dL 6.2(L) 6.7 6.9  Total Bilirubin 0.3 - 1.2 mg/dL 0.6 0.7 0.7  Alkaline Phos 38 - 126 U/L 245(H) 314(H) 95  AST 15 - 41 U/L 86(H) 203(HH) 53(H)  ALT 0 - 44 U/L 249(H) 248(H) 74(H)      RADIOGRAPHIC STUDIES: I have personally reviewed the radiological images as listed and agreed with the findings in the report. No results found.   ASSESSMENT & PLAN:  Tara Rodgers is a 75 y.o. female with   1. 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 I discussed continuing and as pain improved she can reduce her pain medication and sucralfate use.  -I encouraged her to continue to increase soft bland food diet and increase water intake to at least 40 ounces daily.  -She has started PT, will continue as her ambulation and movement form deconditioning is low. -Her breathing is fair. She does have left pleural effusion on exam today (12/08/20). She has not required supplemental oxygen in the last few days.  -Her back pain is controled on lidocaine patch alone as she was not able to pick up her fentanyl patch. She is fine to continue lidocaine patch alone for now.  -Will proceed with IV Fluids today   2. Bone Mets -Her 12/01/20 MRI brain which showed no brain mets, but does have bone metastasis in her right skull  base. I reviewed with patient today. She has no head pain currently. If pain presents she may have palliative RT.   3. Metastatic small cell lung cancer -Status post palliative radiation and for cycle chemotherapy dose reduction -Her oxygen requirement has decreased lately, the palpable right supraclavicular lymph node has also decreased in size, she is likely responding to treatment. -Overall very frail, continue supportive care -Given she is still recovering this week, will post ponce C2 treatment until 3/7. She is agreeable. I encouraged her to focus on weight gain and gaining strength.  -she clinically responded to chemo and RT, I encourage her to continue chemo, to improve her quality of life. pt and her husband agree  4. Goal of care discussion, DNR/DNI -We again discussed the incurable nature of his/her cancer, and the overall poor prognosis, especially if he/she does not have good response to chemotherapy or progress on chemo -The patient understands the goal of care is palliative.   PLAN -IV Fluids today NS 1L  -Lab, flush, f/u and chemo carbo and etopside on 3/7, chemo etopside on 3/8 and 3/9 -she will call me next week if she needs more IVF   No problem-specific Assessment & Plan notes found for this encounter.   No orders of the defined types were placed in this encounter.  All questions were answered. The patient knows to call the clinic with any problems, questions or concerns. No barriers to learning was detected. The total time spent in the appointment was 30 minutes.     [name], MD 12/08/2020   I, Joslyn Devon, am acting as scribe for [name], MD.   I have reviewed the above documentation for accuracy and completeness, and I agree with the above.

## 2020-12-06 NOTE — Patient Instructions (Signed)
Rehydration, Adult Rehydration is the replacement of body fluids, salts, and minerals (electrolytes) that are lost during dehydration. Dehydration is when there is not enough water or other fluids in the body. This happens when you lose more fluids than you take in. Common causes of dehydration include:  Not drinking enough fluids. This can occur when you are ill or doing activities that require a lot of energy, especially in hot weather.  Conditions that cause loss of water or other fluids, such as diarrhea, vomiting, sweating, or urinating a lot.  Other illnesses, such as fever or infection.  Certain medicines, such as those that remove excess fluid from the body (diuretics). Symptoms of mild or moderate dehydration may include thirst, dry lips and mouth, and dizziness. Symptoms of severe dehydration may include increased heart rate, confusion, fainting, and not urinating. For severe dehydration, you may need to get fluids through an IV at the hospital. For mild or moderate dehydration, you can usually rehydrate at home by drinking certain fluids as told by your health care provider. What are the risks? Generally, rehydration is safe. However, taking in too much fluid (overhydration) can be a problem. This is rare. Overhydration can cause an electrolyte imbalance, kidney failure, or a decrease in salt (sodium) levels in the body. Supplies needed You will need an oral rehydration solution (ORS) if your health care provider tells you to use one. This is a drink to treat dehydration. It can be found in pharmacies and retail stores. How to rehydrate Fluids Follow instructions from your health care provider for rehydration. The kind of fluid and the amount you should drink depend on your condition. In general, you should choose drinks that you prefer.  If told by your health care provider, drink an ORS. ? Make an ORS by following instructions on the package. ? Start by drinking small amounts,  about  cup (120 mL) every 5-10 minutes. ? Slowly increase how much you drink until you have taken the amount recommended by your health care provider.  Drink enough clear fluids to keep your urine pale yellow. If you were told to drink an ORS, finish it first, then start slowly drinking other clear fluids. Drink fluids such as: ? Water. This includes sparkling water and flavored water. Drinking only water can lead to having too little sodium in your body (hyponatremia). Follow the advice of your health care provider. ? Water from ice chips you suck on. ? Fruit juice with water you add to it (diluted). ? Sports drinks. ? Hot or cold herbal teas. ? Broth-based soups. ? Milk or milk products. Food Follow instructions from your health care provider about what to eat while you rehydrate. Your health care provider may recommend that you slowly begin eating regular foods in small amounts.  Eat foods that contain a healthy balance of electrolytes, such as bananas, oranges, potatoes, tomatoes, and spinach.  Avoid foods that are greasy or contain a lot of sugar. In some cases, you may get nutrition through a feeding tube that is passed through your nose and into your stomach (nasogastric tube, or NG tube). This may be done if you have uncontrolled vomiting or diarrhea.   Beverages to avoid Certain beverages may make dehydration worse. While you rehydrate, avoid drinking alcohol.   How to tell if you are recovering from dehydration You may be recovering from dehydration if:  You are urinating more often than before you started rehydrating.  Your urine is pale yellow.  Your energy level   improves.  You vomit less frequently.  You have diarrhea less frequently.  Your appetite improves or returns to normal.  You feel less dizzy or less light-headed.  Your skin tone and color start to look more normal. Follow these instructions at home:  Take over-the-counter and prescription medicines only  as told by your health care provider.  Do not take sodium tablets. Doing this can lead to having too much sodium in your body (hypernatremia). Contact a health care provider if:  You continue to have symptoms of mild or moderate dehydration, such as: ? Thirst. ? Dry lips. ? Slightly dry mouth. ? Dizziness. ? Dark urine or less urine than normal. ? Muscle cramps.  You continue to vomit or have diarrhea. Get help right away if you:  Have symptoms of dehydration that get worse.  Have a fever.  Have a severe headache.  Have been vomiting and the following happens: ? Your vomiting gets worse or does not go away. ? Your vomit includes blood or green matter (bile). ? You cannot eat or drink without vomiting.  Have problems with urination or bowel movements, such as: ? Diarrhea that gets worse or does not go away. ? Blood in your stool (feces). This may cause stool to look black and tarry. ? Not urinating, or urinating only a small amount of very dark urine, within 6-8 hours.  Have trouble breathing.  Have symptoms that get worse with treatment. These symptoms may represent a serious problem that is an emergency. Do not wait to see if the symptoms will go away. Get medical help right away. Call your local emergency services (911 in the [place] Do not drive yourself to the hospital. Summary  Rehydration is the replacement of body fluids and minerals (electrolytes) that are lost during dehydration.  Follow instructions from your health care provider for rehydration. The kind of fluid and amount you should drink depend on your condition.  Slowly increase how much you drink until you have taken the amount recommended by your health care provider.  Contact your health care provider if you continue to show signs of mild or moderate dehydration. This information is not intended to replace advice given to you by your health care provider. Make sure you discuss any questions you have with  your health care provider. Document Revised: 12/01/2019 Document Reviewed: 10/11/2019 Elsevier Patient Education  2021 [employer]vier Inc.  

## 2020-12-07 ENCOUNTER — Inpatient Hospital Stay: Payer: Medicare Other | Admitting: General Practice

## 2020-12-07 DIAGNOSIS — C3490 Malignant neoplasm of unspecified part of unspecified bronchus or lung: Secondary | ICD-10-CM

## 2020-12-07 NOTE — Progress Notes (Signed)
[facility] CSW Progress Notes  Check in call to patient to assess for needs/progress towards goals.  At last contact, she was struggling with pain control, difficulty eating, emotional distress due to her diagnosis and prognosis.  She is appreciative of symptom management offered by oncologist and reports her pain is better controlled.  She does experience pain on swallowing but "I have not vomited so it must be going down."  She has a bit more energy, although "I cant walk on my own, I always use a wheelchair these days."  She will have a palliative care consult, she is not sure when.  She is encouraged to call as needed for support/resources.  Edwyna Shell, LCSW Clinical Social Worker Phone:  [phone]

## 2020-12-08 ENCOUNTER — Inpatient Hospital Stay: Payer: Medicare Other

## 2020-12-08 ENCOUNTER — Inpatient Hospital Stay: Payer: Medicare Other | Admitting: Hematology

## 2020-12-08 ENCOUNTER — Other Ambulatory Visit: Payer: Self-pay

## 2020-12-08 ENCOUNTER — Telehealth: Payer: Self-pay | Admitting: Hematology

## 2020-12-08 ENCOUNTER — Encounter: Payer: Self-pay | Admitting: Hematology

## 2020-12-08 VITALS — BP 132/81 | HR 120 | Temp 99.8°F | Resp 19 | Ht 59.0 in | Wt 122.3 lb

## 2020-12-08 DIAGNOSIS — C3490 Malignant neoplasm of unspecified part of unspecified bronchus or lung: Secondary | ICD-10-CM

## 2020-12-08 DIAGNOSIS — Z95828 Presence of other vascular implants and grafts: Secondary | ICD-10-CM

## 2020-12-08 DIAGNOSIS — Z7189 Other specified counseling: Secondary | ICD-10-CM

## 2020-12-08 DIAGNOSIS — C50411 Malignant neoplasm of upper-outer quadrant of right female breast: Secondary | ICD-10-CM | POA: Diagnosis not present

## 2020-12-08 DIAGNOSIS — M8580 Other specified disorders of bone density and structure, unspecified site: Secondary | ICD-10-CM | POA: Diagnosis not present

## 2020-12-08 DIAGNOSIS — Z170 Estrogen receptor positive status [ER+]: Secondary | ICD-10-CM | POA: Diagnosis not present

## 2020-12-08 DIAGNOSIS — C50911 Malignant neoplasm of unspecified site of right female breast: Secondary | ICD-10-CM | POA: Diagnosis not present

## 2020-12-08 DIAGNOSIS — Z5111 Encounter for antineoplastic chemotherapy: Secondary | ICD-10-CM | POA: Diagnosis not present

## 2020-12-08 DIAGNOSIS — Z923 Personal history of irradiation: Secondary | ICD-10-CM | POA: Diagnosis not present

## 2020-12-08 DIAGNOSIS — C787 Secondary malignant neoplasm of liver and intrahepatic bile duct: Secondary | ICD-10-CM | POA: Diagnosis not present

## 2020-12-08 DIAGNOSIS — I10 Essential (primary) hypertension: Secondary | ICD-10-CM | POA: Diagnosis not present

## 2020-12-08 DIAGNOSIS — C7800 Secondary malignant neoplasm of unspecified lung: Secondary | ICD-10-CM | POA: Diagnosis not present

## 2020-12-08 DIAGNOSIS — Z79811 Long term (current) use of aromatase inhibitors: Secondary | ICD-10-CM | POA: Diagnosis not present

## 2020-12-08 DIAGNOSIS — M1990 Unspecified osteoarthritis, unspecified site: Secondary | ICD-10-CM | POA: Diagnosis not present

## 2020-12-08 DIAGNOSIS — R000 Tachycardia, unspecified: Secondary | ICD-10-CM | POA: Diagnosis not present

## 2020-12-08 DIAGNOSIS — Z853 Personal history of malignant neoplasm of breast: Secondary | ICD-10-CM | POA: Diagnosis not present

## 2020-12-08 LAB — CBC WITH DIFFERENTIAL (CANCER CENTER ONLY)
Abs Immature Granulocytes: 0.55 10*3/uL — ABNORMAL HIGH (ref 0.00–0.07)
Basophils Absolute: 0 10*3/uL (ref 0.0–0.1)
Basophils Relative: 1 %
Eosinophils Absolute: 0 10*3/uL (ref 0.0–0.5)
Eosinophils Relative: 1 %
HCT: 29.8 % — ABNORMAL LOW (ref 36.0–46.0)
Hemoglobin: 9.7 g/dL — ABNORMAL LOW (ref 12.0–15.0)
Immature Granulocytes: 17 %
Lymphocytes Relative: 8 %
Lymphs Abs: 0.3 10*3/uL — ABNORMAL LOW (ref 0.7–4.0)
MCH: 27.1 pg (ref 26.0–34.0)
MCHC: 32.6 g/dL (ref 30.0–36.0)
MCV: 83.2 fL (ref 80.0–100.0)
Monocytes Absolute: 0.4 10*3/uL (ref 0.1–1.0)
Monocytes Relative: 13 %
Neutro Abs: 2 10*3/uL (ref 1.7–7.7)
Neutrophils Relative %: 60 %
Platelet Count: 139 10*3/uL — ABNORMAL LOW (ref 150–400)
RBC: 3.58 MIL/uL — ABNORMAL LOW (ref 3.87–5.11)
RDW: 13.6 % (ref 11.5–15.5)
WBC Count: 3.3 10*3/uL — ABNORMAL LOW (ref 4.0–10.5)
nRBC: 0.6 % — ABNORMAL HIGH (ref 0.0–0.2)

## 2020-12-08 LAB — CMP (CANCER CENTER ONLY)
ALT: 249 U/L — ABNORMAL HIGH (ref 0–44)
AST: 86 U/L — ABNORMAL HIGH (ref 15–41)
Albumin: 2.8 g/dL — ABNORMAL LOW (ref 3.5–5.0)
Alkaline Phosphatase: 245 U/L — ABNORMAL HIGH (ref 38–126)
Anion gap: 12 (ref 5–15)
BUN: 12 mg/dL (ref 8–23)
CO2: 19 mmol/L — ABNORMAL LOW (ref 22–32)
Calcium: 8.5 mg/dL — ABNORMAL LOW (ref 8.9–10.3)
Chloride: 104 mmol/L (ref 98–111)
Creatinine: 0.75 mg/dL (ref 0.44–1.00)
GFR, Estimated: >60 mL/min (ref >60)
Glucose, Bld: 94 mg/dL (ref 70–99)
Potassium: 2.8 mmol/L — ABNORMAL LOW (ref 3.5–5.1)
Sodium: 135 mmol/L (ref 135–145)
Total Bilirubin: 0.6 mg/dL (ref 0.3–1.2)
Total Protein: 6.2 g/dL — ABNORMAL LOW (ref 6.5–8.1)

## 2020-12-08 LAB — MAGNESIUM: Magnesium: 1.5 mg/dL — ABNORMAL LOW (ref 1.7–2.4)

## 2020-12-08 MED ORDER — HEPARIN SOD (PORK) LOCK FLUSH 100 UNIT/ML IV SOLN
500.0000 [IU] | Freq: Once | INTRAVENOUS | Status: AC
  Administered 2020-12-08: 500 [IU]
  Filled 2020-12-08: qty 5

## 2020-12-08 MED ORDER — SODIUM CHLORIDE 0.9% FLUSH
10.0000 mL | Freq: Once | INTRAVENOUS | Status: AC
  Administered 2020-12-08: 10 mL
  Filled 2020-12-08: qty 10

## 2020-12-08 MED ORDER — SODIUM CHLORIDE 0.9 % IV SOLN
INTRAVENOUS | Status: AC
  Filled 2020-12-08 (×2): qty 250

## 2020-12-08 NOTE — Telephone Encounter (Signed)
Left message with follow-up appointments per 2/25 los and moved upcoming appointment due to provider's template. Gave option to call back to reschedule if needed.

## 2020-12-08 NOTE — Patient Instructions (Signed)
Rehydration, Adult Rehydration is the replacement of body fluids, salts, and minerals (electrolytes) that are lost during dehydration. Dehydration is when there is not enough water or other fluids in the body. This happens when you lose more fluids than you take in. Common causes of dehydration include:  Not drinking enough fluids. This can occur when you are ill or doing activities that require a lot of energy, especially in hot weather.  Conditions that cause loss of water or other fluids, such as diarrhea, vomiting, sweating, or urinating a lot.  Other illnesses, such as fever or infection.  Certain medicines, such as those that remove excess fluid from the body (diuretics). Symptoms of mild or moderate dehydration may include thirst, dry lips and mouth, and dizziness. Symptoms of severe dehydration may include increased heart rate, confusion, fainting, and not urinating. For severe dehydration, you may need to get fluids through an IV at the hospital. For mild or moderate dehydration, you can usually rehydrate at home by drinking certain fluids as told by your health care provider. What are the risks? Generally, rehydration is safe. However, taking in too much fluid (overhydration) can be a problem. This is rare. Overhydration can cause an electrolyte imbalance, kidney failure, or a decrease in salt (sodium) levels in the body. Supplies needed You will need an oral rehydration solution (ORS) if your health care provider tells you to use one. This is a drink to treat dehydration. It can be found in pharmacies and retail stores. How to rehydrate Fluids Follow instructions from your health care provider for rehydration. The kind of fluid and the amount you should drink depend on your condition. In general, you should choose drinks that you prefer.  If told by your health care provider, drink an ORS. ? Make an ORS by following instructions on the package. ? Start by drinking small amounts,  about  cup (120 mL) every 5-10 minutes. ? Slowly increase how much you drink until you have taken the amount recommended by your health care provider.  Drink enough clear fluids to keep your urine pale yellow. If you were told to drink an ORS, finish it first, then start slowly drinking other clear fluids. Drink fluids such as: ? Water. This includes sparkling water and flavored water. Drinking only water can lead to having too little sodium in your body (hyponatremia). Follow the advice of your health care provider. ? Water from ice chips you suck on. ? Fruit juice with water you add to it (diluted). ? Sports drinks. ? Hot or cold herbal teas. ? Broth-based soups. ? Milk or milk products. Food Follow instructions from your health care provider about what to eat while you rehydrate. Your health care provider may recommend that you slowly begin eating regular foods in small amounts.  Eat foods that contain a healthy balance of electrolytes, such as bananas, oranges, potatoes, tomatoes, and spinach.  Avoid foods that are greasy or contain a lot of sugar. In some cases, you may get nutrition through a feeding tube that is passed through your nose and into your stomach (nasogastric tube, or NG tube). This may be done if you have uncontrolled vomiting or diarrhea.   Beverages to avoid Certain beverages may make dehydration worse. While you rehydrate, avoid drinking alcohol.   How to tell if you are recovering from dehydration You may be recovering from dehydration if:  You are urinating more often than before you started rehydrating.  Your urine is pale yellow.  Your energy level   improves.  You vomit less frequently.  You have diarrhea less frequently.  Your appetite improves or returns to normal.  You feel less dizzy or less light-headed.  Your skin tone and color start to look more normal. Follow these instructions at home:  Take over-the-counter and prescription medicines only  as told by your health care provider.  Do not take sodium tablets. Doing this can lead to having too much sodium in your body (hypernatremia). Contact a health care provider if:  You continue to have symptoms of mild or moderate dehydration, such as: ? Thirst. ? Dry lips. ? Slightly dry mouth. ? Dizziness. ? Dark urine or less urine than normal. ? Muscle cramps.  You continue to vomit or have diarrhea. Get help right away if you:  Have symptoms of dehydration that get worse.  Have a fever.  Have a severe headache.  Have been vomiting and the following happens: ? Your vomiting gets worse or does not go away. ? Your vomit includes blood or green matter (bile). ? You cannot eat or drink without vomiting.  Have problems with urination or bowel movements, such as: ? Diarrhea that gets worse or does not go away. ? Blood in your stool (feces). This may cause stool to look black and tarry. ? Not urinating, or urinating only a small amount of very dark urine, within 6-8 hours.  Have trouble breathing.  Have symptoms that get worse with treatment. These symptoms may represent a serious problem that is an emergency. Do not wait to see if the symptoms will go away. Get medical help right away. Call your local emergency services (911 in the [place] Do not drive yourself to the hospital. Summary  Rehydration is the replacement of body fluids and minerals (electrolytes) that are lost during dehydration.  Follow instructions from your health care provider for rehydration. The kind of fluid and amount you should drink depend on your condition.  Slowly increase how much you drink until you have taken the amount recommended by your health care provider.  Contact your health care provider if you continue to show signs of mild or moderate dehydration. This information is not intended to replace advice given to you by your health care provider. Make sure you discuss any questions you have with  your health care provider. Document Revised: 12/01/2019 Document Reviewed: 10/11/2019 Elsevier Patient Education  2021 [employer]vier Inc.  

## 2020-12-08 NOTE — Patient Instructions (Signed)
Implanted Port Insertion, Care After This sheet gives you information about how to care for yourself after your procedure. Your health care provider may also give you more specific instructions. If you have problems or questions, contact your health care provider. What can I expect after the procedure? After the procedure, it is common to have:  Discomfort at the port insertion site.  Bruising on the skin over the port. This should improve over 3-4 days. Follow these instructions at home: Port care  After your port is placed, you will get a manufacturer's information card. The card has information about your port. Keep this card with you at all times.  Take care of the port as told by your health care provider. Ask your health care provider if you or a family member can get training for taking care of the port at home. A home health care nurse may also take care of the port.  Make sure to remember what type of port you have. Incision care  Follow instructions from your health care provider about how to take care of your port insertion site. Make sure you: ? Wash your hands with soap and water before and after you change your bandage (dressing). If soap and water are not available, use hand sanitizer. ? Change your dressing as told by your health care provider. ? Leave stitches (sutures), skin glue, or adhesive strips in place. These skin closures may need to stay in place for 2 weeks or longer. If adhesive strip edges start to loosen and curl up, you may trim the loose edges. Do not remove adhesive strips completely unless your health care provider tells you to do that.  Check your port insertion site every day for signs of infection. Check for: ? Redness, swelling, or pain. ? Fluid or blood. ? Warmth. ? Pus or a bad smell.      Activity  Return to your normal activities as told by your health care provider. Ask your health care provider what activities are safe for you.  Do not  lift anything that is heavier than 10 lb (4.5 kg), or the limit that you are told, until your health care provider says that it is safe. General instructions  Take over-the-counter and prescription medicines only as told by your health care provider.  Do not take baths, swim, or use a hot tub until your health care provider approves. Ask your health care provider if you may take showers. You may only be allowed to take sponge baths.  Do not drive for 24 hours if you were given a sedative during your procedure.  Wear a medical alert bracelet in case of an emergency. This will tell any health care providers that you have a port.  Keep all follow-up visits as told by your health care provider. This is important. Contact a health care provider if:  You cannot flush your port with saline as directed, or you cannot draw blood from the port.  You have a fever or chills.  You have redness, swelling, or pain around your port insertion site.  You have fluid or blood coming from your port insertion site.  Your port insertion site feels warm to the touch.  You have pus or a bad smell coming from the port insertion site. Get help right away if:  You have chest pain or shortness of breath.  You have bleeding from your port that you cannot control. Summary  Take care of the port as told by your   health care provider. Keep the manufacturer's information card with you at all times.  Change your dressing as told by your health care provider.  Contact a health care provider if you have a fever or chills or if you have redness, swelling, or pain around your port insertion site.  Keep all follow-up visits as told by your health care provider. This information is not intended to replace advice given to you by your health care provider. Make sure you discuss any questions you have with your health care provider. Document Revised: 04/28/2018 Document Reviewed: 04/28/2018 Elsevier Patient Education   2021 Elsevier Inc.  

## 2020-12-11 ENCOUNTER — Telehealth: Payer: Self-pay | Admitting: Nutrition

## 2020-12-11 ENCOUNTER — Other Ambulatory Visit: Payer: Self-pay

## 2020-12-11 ENCOUNTER — Inpatient Hospital Stay: Payer: Medicare Other | Admitting: Nutrition

## 2020-12-11 DIAGNOSIS — Z95828 Presence of other vascular implants and grafts: Secondary | ICD-10-CM

## 2020-12-11 DIAGNOSIS — C3490 Malignant neoplasm of unspecified part of unspecified bronchus or lung: Secondary | ICD-10-CM

## 2020-12-11 DIAGNOSIS — E876 Hypokalemia: Secondary | ICD-10-CM

## 2020-12-11 LAB — TSH: TSH: 27.865 u[IU]/mL — ABNORMAL HIGH (ref 0.308–3.960)

## 2020-12-11 MED ORDER — POTASSIUM CHLORIDE CRYS ER 20 MEQ PO TBCR: 20.0000 meq | EXTENDED_RELEASE_TABLET | Freq: Two times a day (BID) | ORAL | 0 refills | Status: AC

## 2020-12-11 MED ORDER — LIDOCAINE-PRILOCAINE 2.5-2.5 % EX CREA: 1.0000 "application " | TOPICAL_CREAM | CUTANEOUS | 3 refills | Status: AC | PRN

## 2020-12-11 NOTE — Telephone Encounter (Signed)
-----   Message from [name], MD sent at 12/11/2020  7:17 AM EST ----- Please let pt know her K was low last Friday, and call in [facility] 38meq bid for #30, and arrange IVF with [facility] 21meq over 2 hrs today or tomorrow, thanks   [employer]  12/11/2020

## 2020-12-11 NOTE — Progress Notes (Signed)
See telephone note.

## 2020-12-11 NOTE — Telephone Encounter (Signed)
Telephone consult completed with patient diagnosed with small cell lung cancer and followed by Dr. [name].  Past medical history includes GERD, hypertension, hypothyroidism, breast cancer.  Medications include Nexium, Zofran, Compazine, Senokot-S, and Carafate.  Labs were reviewed.  Right: 4 feet 11 inches. Weight: 122.3 pounds February 25. Usual body weight: 138 pounds in January. BMI: 24.7.  Patient reports difficulty swallowing secondary to dysphagia and odonophagia/radiation esophagitis.  She is taking Carafate and Hycet which helps.  She typically tries to eat soft foods and avoid high acid foods.  Reports food tastes good but if she starts to feel burning she stops eating.  She has tried to eat eggs, fruit, watermelon, and has consumed a vanilla Slim fast drink with additional milk to thin down consistency.  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  Intervention: Educated on importance of increasing calories and protein in small frequent meals and snacks. Encourage soft bland foods as tolerated. Recommended high-calorie, high-protein oral nutrition supplements thinned as needed. Email fact sheets to patient at her request. Questions were answered.  Teach back method used.  Contact information given.  Monitoring, evaluation, goals: Patient will tolerate increased calories and protein to minimize weight loss.  Next visit: Monday, March 7 during infusion.  **Disclaimer: This note was dictated with voice recognition software. Similar sounding words can inadvertently be transcribed and this note may contain transcription errors which may not have been corrected upon publication of note.**

## 2020-12-11 NOTE — Telephone Encounter (Signed)
I spoke with Ms Shehata.  I reviewed Dr Ernestina Penna comments and recommendations.  She is agreeable.  Appt made for [facility] for 3/2.  Instructions give on dissolving potassium pills for easier ingestion.  Ms Mckneely verbalized understanding.

## 2020-12-12 ENCOUNTER — Other Ambulatory Visit: Payer: Self-pay

## 2020-12-12 DIAGNOSIS — C3490 Malignant neoplasm of unspecified part of unspecified bronchus or lung: Secondary | ICD-10-CM

## 2020-12-12 DIAGNOSIS — E876 Hypokalemia: Secondary | ICD-10-CM

## 2020-12-13 ENCOUNTER — Inpatient Hospital Stay: Payer: Medicare Other | Attending: Hematology

## 2020-12-13 ENCOUNTER — Other Ambulatory Visit: Payer: Self-pay

## 2020-12-13 VITALS — HR 112 | Temp 97.8°F | Resp 18 | Wt 119.4 lb

## 2020-12-13 DIAGNOSIS — C3490 Malignant neoplasm of unspecified part of unspecified bronchus or lung: Secondary | ICD-10-CM

## 2020-12-13 DIAGNOSIS — Z79811 Long term (current) use of aromatase inhibitors: Secondary | ICD-10-CM | POA: Insufficient documentation

## 2020-12-13 DIAGNOSIS — Z95828 Presence of other vascular implants and grafts: Secondary | ICD-10-CM

## 2020-12-13 DIAGNOSIS — C7800 Secondary malignant neoplasm of unspecified lung: Secondary | ICD-10-CM | POA: Diagnosis not present

## 2020-12-13 DIAGNOSIS — I10 Essential (primary) hypertension: Secondary | ICD-10-CM | POA: Insufficient documentation

## 2020-12-13 DIAGNOSIS — E876 Hypokalemia: Secondary | ICD-10-CM

## 2020-12-13 DIAGNOSIS — Z853 Personal history of malignant neoplasm of breast: Secondary | ICD-10-CM | POA: Insufficient documentation

## 2020-12-13 DIAGNOSIS — C50411 Malignant neoplasm of upper-outer quadrant of right female breast: Secondary | ICD-10-CM | POA: Insufficient documentation

## 2020-12-13 DIAGNOSIS — M1990 Unspecified osteoarthritis, unspecified site: Secondary | ICD-10-CM | POA: Diagnosis not present

## 2020-12-13 DIAGNOSIS — M8580 Other specified disorders of bone density and structure, unspecified site: Secondary | ICD-10-CM | POA: Insufficient documentation

## 2020-12-13 DIAGNOSIS — C7951 Secondary malignant neoplasm of bone: Secondary | ICD-10-CM | POA: Diagnosis not present

## 2020-12-13 DIAGNOSIS — Z170 Estrogen receptor positive status [ER+]: Secondary | ICD-10-CM | POA: Insufficient documentation

## 2020-12-13 DIAGNOSIS — Z5111 Encounter for antineoplastic chemotherapy: Secondary | ICD-10-CM | POA: Insufficient documentation

## 2020-12-13 DIAGNOSIS — Z923 Personal history of irradiation: Secondary | ICD-10-CM | POA: Insufficient documentation

## 2020-12-13 DIAGNOSIS — Z9011 Acquired absence of right breast and nipple: Secondary | ICD-10-CM | POA: Diagnosis not present

## 2020-12-13 MED ORDER — POTASSIUM CHLORIDE 10 MEQ/100ML IV SOLN
INTRAVENOUS | Status: AC
  Filled 2020-12-13: qty 200

## 2020-12-13 MED ORDER — POTASSIUM CHLORIDE 10 MEQ/100ML IV SOLN
10.0000 meq | INTRAVENOUS | Status: AC
Start: 2020-12-13 — End: 2020-12-13
  Administered 2020-12-13 (×2): 10 meq via INTRAVENOUS

## 2020-12-13 MED ORDER — SODIUM CHLORIDE 0.9% FLUSH
10.0000 mL | Freq: Once | INTRAVENOUS | Status: AC
  Administered 2020-12-13: 10 mL
  Filled 2020-12-13: qty 10

## 2020-12-13 MED ORDER — HEPARIN SOD (PORK) LOCK FLUSH 100 UNIT/ML IV SOLN
500.0000 [IU] | Freq: Once | INTRAVENOUS | Status: AC
  Administered 2020-12-13: 500 [IU]
  Filled 2020-12-13: qty 5

## 2020-12-13 MED ORDER — SODIUM CHLORIDE 0.9 % IV SOLN
INTRAVENOUS | Status: DC
  Filled 2020-12-13: qty 250

## 2020-12-13 NOTE — Patient Instructions (Signed)

## 2020-12-13 NOTE — Progress Notes (Signed)
No labs done today. Ok per MD for Potassium Chloride 20MEq IV. 12/08/20 K = 2.8.  Raul Del [place], [place], BCPS, BCOP 12/13/2020 10:35 AM

## 2020-12-14 ENCOUNTER — Other Ambulatory Visit: Payer: Self-pay

## 2020-12-14 DIAGNOSIS — E876 Hypokalemia: Secondary | ICD-10-CM

## 2020-12-14 MED ORDER — POTASSIUM CHLORIDE ER 10 MEQ PO CPCR: 10.0000 meq | ORAL_CAPSULE | Freq: Two times a day (BID) | ORAL | 2 refills | Status: AC

## 2020-12-15 NOTE — Progress Notes (Signed)
[place]   [phone] [phone]   Clinic Follow up Note   Patient Care Team: [name], [name] [name], MD as PCP - General (Internal Medicine) [name], MD as Consulting Physician (Hematology) [name], MD (Inactive) as Consulting Physician (General Surgery) [name], NP as Nurse Practitioner (Nurse Practitioner) [name], MD as Consulting Physician (Plastic Surgery) [name], MD as Consulting Physician (Orthopedic Surgery)  Date of Service:  12/18/2020  CHIEF COMPLAINT: F/u of metastaticRight small cell lung cancer  SUMMARY OF ONCOLOGIC HISTORY: 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 [facility]) Staging form: Breast, AJCC 7th Edition - Clinical: Stage IIA (T2, N0, M0) - Signed by [name], MD on 02/08/2015 Laterality: Right Estrogen receptor status: Positive Progesterone receptor status: Positive HER2 status: Negative - Pathologic stage from 03/23/2015: Stage Unknown (T2, NX, cM0) - Unsigned  Small cell lung cancer ([place]) Staging form: Lung, AJCC 8th Edition - Clinical stage from 11/13/2020: Stage IV (cTX, cN3, cM1) - Signed by [name], MD on 11/16/2020 Stage prefix: Initial diagnosis     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) and adjuvant radiation ([name]).   12/13/2014 Mammogram   Right breast: possible asymmetry warranting further evaluation with spot compression views and possibly ultrasound   12/16/2014 Breast US   Right breast: no definitive abnormality within the superior aspect of the right breast to correspond with the mammographic finding.   01/10/2015 Breast MRI   Right breast: irregular  rim enhancing mass within the superior right breast at 11:30 measuring 2.5 x 2.4 x 2.4 cm. No abnormal enhancement is identified within the skin of the superior right breast in the area of concern on mammogram.   01/12/2015 Breast US   Second look diagnostic mammogram and ultrasound showed a 2.3 cm mass in the upper midline right breast. No axillary adenopathy.   01/12/2015 Initial Biopsy   Right breast needle core bx (upper midline): Invasive ductal carcinoma, grade 2, ER+ (100%), PR+ (77%), HER2/neu negative (ratio 1.15), Ki67 20%    01/30/2015 Procedure   Breast High/Moderate Risk panel ([facility]) reveals no clinically significant variant at ATM, BRCA1, BRCA2, CDH1, CHEK2, PALB2, PTEN, STK11, and TP53.   02/08/2015 Clinical Stage   Stage IIA (T2 N0)   03/23/2015 Definitive Surgery   Right mastectomy ([name]): invasive adenocarcinoma, grade 3, 2.9 cm, HER2/neu repeated, negative (ratio 1.23)   03/23/2015 Oncotype testing   Score: 8 (6% ROR). No chemotherapy [name]).   03/23/2015 Pathologic Stage   Stage IIA: pT2 pNx   05/04/2015 -  Anti-estrogen oral therapy   Letrozole 2.53m daily. Planned duration of therapy at least 5 years.    07/25/2015 Survivorship   Survivorship visit completed and copy of care plan provided to patient.   12/15/2015 Mammogram   IMPRESSION: No mammographic evidence of malignancy. A result letter of this screening mammogram will be mailed directly to the patient.   12/23/2016 Mammogram   IMPRESSION: No mammographic evidence of malignancy. A result letter of this screening mammogram will be mailed directly to the patient.    12/30/2017 Mammogram   12/30/2017 Mammogram IMPRESSION: No mammographic evidence of malignancy. A result letter of this screening mammogram will be mailed directly to the patient.   03/27/2020 Mammogram  IMPRESSION: No mammographic evidence of malignancy. A result letter of this screening mammogram will be mailed directly to the patient.    10/26/2020 Imaging   CT chest IMPRESSION: 1. Bulky mediastinal, thoracic inlet, right supraclavicular, and left axillary lymphadenopathy consistent with metastatic disease. Associated numerous bilateral pulmonary nodules and masses, most prominently in the right middle lobe with dominant 4.1 cm peripheral mass lesion. 2. 16 mm rim enhancing lesion posterior right liver consistent with metastatic involvement. 3. 13 mm hypoenhancing lesion in the tail of pancreas. Primary neoplasm or metastatic involvement could have this appearance. 4. Multiple nodules in the upper abdomen concerning for peritoneal/mesenteric metastatic disease. 5. Small right and tiny left pleural effusions with bibasilar atelectasis. 6. Aortic Atherosclerosis (ICD10-I70.0).   11/10/2020 PET scan   IMPRESSION: 1. Extensive hypermetabolic multi organ metastatic disease. 2. Bulky intensely hypermetabolic RIGHT supraclavicular, mediastinal and LEFT axillary adenopathy. 3. Hypermetabolic pulmonary mass in the RIGHT upper lobe with additional hypermetabolic nodules. 4. Hypermetabolic solitary hepatic metastasis. 5. Hypermetabolic lesion within the pancreas is also favored metastatic lesion. 6. Multifocal hypermetabolic skeletal metastasis. 7. Bilateral pleural effusions.   11/13/2020 Procedure   Thoracentesis  IMPRESSION: Successful ultrasound guided right thoracentesis yielding 200 mL of pleural fluid.   Small cell lung cancer ([name])  11/13/2020 Pathology Results   Right supraclavicular LN Biopsy  FINAL MICROSCOPIC DIAGNOSIS:   A. LYMPH NODE, RIGHT SUPRACLAVICULAR, NEEDLE CORE BIOPSY:  - Most consistent with small cell carcinoma.   COMMENT:   Tumor is limited in quantity, and partially necrotic.  TTF-1 and  Synaptophysin are positive.  Ki-67 proliferation index reaches 80-90%.  GATA-3, ER and p40 are negative.  The morphologic and immunophenotypic  characteristics are most suggestive of small cell  carcinoma.  TTF-1  positive staining is compatible with origin from the lung; however, it  does not exclude origin from other entities.  Please note that distinct  nodal tissue is not identified in the submitted material.  Results  reported to Dr. [name] on 11/16/2020.  Dr. Melina Copa reviewed the case   11/13/2020 Cancer Staging   Staging form: Lung, AJCC 8th Edition - Clinical stage from 11/13/2020: Stage IV (cTX, cN3, cM1) - Signed by [name], MD on 11/16/2020 Stage prefix: Initial diagnosis   11/13/2020 - 11/28/2020 Radiation Therapy   palliative radiation to [facility] with Dr Lisbeth Renshaw 11/13/20-11/28/20   11/16/2020 Initial Diagnosis   Small cell lung cancer ([place])   11/22/2020 -  Chemotherapy   First line etoposide D1-3, carboplatin and atezolizumab D1 q3weeks beginning 11/22/20.        11/29/2020 Procedure   PAC placed on 11/29/20      CURRENT THERAPY:  First lineetoposideD1-3,carboplatin and atezolizumabD1 q3weeks beginning 11/22/20.  INTERVAL HISTORY:  LADREA HOLLADAY is here for a follow up. She presents to the clinic alone. She notes she has no appetite. She notes her chest pain is improving. She notes some foods still catches but able to go down. She notes she is not drinking enough. She can drink maybe 12 ounces and does not take much more than 2-3 bites. This low intake started last week. She notes she takes medication with yogurt or apple sauce whole. She notes more pain in her left side. She notes she no longer has Hycet, last dose earlier today. She notes she picked up fentanyl patch but did not use it. She notes her BM are not normal. She required enema but still with low output.    REVIEW OF SYSTEMS:   Constitutional: Denies  fevers, chills or abnormal weight loss (+) no appetite  Eyes: Denies blurriness of vision Ears, nose, mouth, throat, and face: Denies mucositis or sore throat Respiratory: Denies cough, dyspnea or wheezes Cardiovascular: Denies palpitation, chest  discomfort or lower extremity swelling Gastrointestinal:  Denies nausea, heartburn (+) constipation  Skin: Denies abnormal skin rashes Lymphatics: Denies new lymphadenopathy or easy bruising Neurological:Denies numbness, tingling or new weaknesses Behavioral/Psych: Mood is stable, no new changes  All other systems were reviewed with the patient and are negative.  MEDICAL HISTORY:  Past Medical History:  Diagnosis Date  . Anxiety   . Asthma    does not take any medications, hx of inhaler use  . Breast cancer [facility]) 1995/2016   right sided breast cancer in 08/1994  . Bronchitis    hx of  . GERD (gastroesophageal reflux disease)   . History of hiatal hernia   . Hypertension   . Hyperthyroidism   . Lung cancer ([place]) 11/02/2020    SURGICAL HISTORY: Past Surgical History:  Procedure Laterality Date  . BREAST BIOPSY Right 2016   malignant  . BREAST EXCISIONAL BIOPSY Left 1996   benign  . Carpel Tunnel  Bilateral   . INCONTINENCE SURGERY     with mesh insertion about 7 years ago  . IR IMAGING GUIDED PORT INSERTION  11/29/2020  . LATISSIMUS FLAP TO BREAST Right 03/23/2015   Procedure: LATISSIMUS FLAP TO BREAST WITH PLACEMENT OF IMPLANT FOR BREAST RECONSTRUCTION;  Surgeon: [name], MD;  Location: [facility];  Service: Plastics;  Laterality: Right;  . MASTECTOMY Right 03/23/2015  . RECONSTRUCTION BREAST W/ LATISSIMUS DORSI FLAP Right 03/23/2015  . TONSILLECTOMY     as a child  . TOTAL KNEE ARTHROPLASTY Right 03/13/2018   Procedure: RIGHT TOTAL KNEE ARTHROPLASTY;  Surgeon: Sydnee Cabal, MD;  Location: [facility];  Service: Orthopedics;  Laterality: Right;  Adductor Block  . TOTAL MASTECTOMY Right 03/23/2015   Procedure: RIGHT TOTAL MASTECTOMY;  Surgeon: [name], MD;  Location: [facility];  Service: General;  Laterality: Right;    I have reviewed the social history and family history with the patient and they are unchanged from previous note.  ALLERGIES:  is allergic to fosamax  [alendronate sodium].  MEDICATIONS:  Current Outpatient Medications  Medication Sig Dispense Refill  . megestrol (MEGACE ES) 625 MG/5ML suspension Take 5 mLs (625 mg total) by mouth daily. 150 mL 0  . allopurinol (ZYLOPRIM) 300 MG tablet Take 1 tablet (300 mg total) by mouth daily. 30 tablet 1  . ALPRAZolam (XANAX) 0.5 MG tablet Take 1 tablet by mouth twice daily as needed (Patient taking differently: Take 0.5 mg by mouth 2 (two) times daily as needed for anxiety.) 180 tablet 0  . cyclobenzaprine (FLEXERIL) 10 MG tablet Take 1 tablet (10 mg total) by mouth 3 (three) times daily as needed for muscle spasms. 30 tablet 2  . esomeprazole (NEXIUM) 40 MG capsule One po daily at bedtime (Patient taking differently: Take 40 mg by mouth daily.) 90 capsule 1  . EUTHYROX 75 MCG tablet Take 1 tablet by mouth once daily (Patient taking differently: Take 75 mcg by mouth daily before breakfast.) 90 tablet 0  . fentaNYL (DURAGESIC) 12 MCG/HR Place 1 patch onto the skin every 3 (three) days. 5 patch 0  . HYDROcodone-acetaminophen (HYCET) 7.5-325 mg/15 ml solution Take 10-15 mLs by mouth every 4 (four) hours as needed for moderate pain or severe pain. 473 mL 0  . letrozole (FEMARA) 2.5 MG tablet Take 1  tablet (2.5 mg total) by mouth daily. 90 tablet 3  . lidocaine (LIDODERM) 5 % Place 1 patch onto the skin daily. Remove & Discard patch within 12 hours or as directed by MD 30 patch 0  . lidocaine-prilocaine (EMLA) cream Apply 1 application topically as needed. 30 g 3  . meloxicam (MOBIC) 7.5 MG tablet Take 7.5 mg by mouth 2 (two) times daily as needed for pain.    . metoprolol succinate (TOPROL-XL) 50 MG 24 hr tablet Take 1 tablet by mouth twice daily (Patient taking differently: Take 50 mg by mouth 2 (two) times daily.) 60 tablet 1  . ondansetron (ZOFRAN) 8 MG tablet Take 1 tablet (8 mg total) by mouth 2 (two) times daily as needed for refractory nausea / vomiting. Start on day 3 after carboplatin chemo. 30 tablet 1   . potassium chloride (MICRO-K) 10 MEQ CR capsule Take 1 capsule (10 mEq total) by mouth 2 (two) times daily. 30 capsule 2  . potassium chloride SA (KLOR-CON) 20 MEQ tablet Take 1 tablet (20 mEq total) by mouth 2 (two) times daily. 30 tablet 0  . prochlorperazine (COMPAZINE) 10 MG tablet Take 1 tablet (10 mg total) by mouth every 6 (six) hours as needed (Nausea or vomiting). 30 tablet 1  . senna-docusate (SENOKOT-S) 8.6-50 MG tablet Take 1 tablet by mouth daily. 30 tablet 0  . simvastatin (ZOCOR) 40 MG tablet TAKE 1 TABLET BY MOUTH AT BEDTIME (Patient taking differently: Take by mouth daily at 6 (six) AM.) 90 tablet 0  . sucralfate (CARAFATE) 1 g tablet Take 1 tablet (1 g total) by mouth 4 (four) times daily. Dissolve each tablet in 15 cc water before use. 120 tablet 2  . verapamil (CALAN-SR) 240 MG CR tablet Take 1 tablet by mouth once daily (Patient taking differently: Take 240 mg by mouth daily.) 90 tablet 0   No current facility-administered medications for this visit.   Facility-Administered Medications Ordered in Other Visits  Medication Dose Route Frequency Provider Last Rate Last Admin  . Hot Pack 1 packet  1 packet Topical Once PRN [name], MD      . sodium chloride flush (NS) 0.9 % injection 10 mL  10 mL Intracatheter PRN [name], MD   10 mL at 12/18/20 1824    PHYSICAL EXAMINATION: ECOG PERFORMANCE STATUS: 3 - Symptomatic, >50% confined to bed  There were no vitals filed for this visit. There were no vitals filed for this visit.  GENERAL:alert, no distress and comfortable SKIN: skin color, texture, turgor are normal, no rashes or significant lesions EYES: normal, Conjunctiva are pink and non-injected, sclera clear  NECK: supple, thyroid normal size, non-tender, without nodularity LYMPH:  no palpable lymphadenopathy in the cervical, axillary  LUNGS: clear to auscultation and percussion with normal breathing effort HEART: regular rate & rhythm and no murmurs and no lower  extremity edema ABDOMEN:abdomen soft, non-tender (+) Low bowel sounds Musculoskeletal:no cyanosis of digits and no clubbing  NEURO: alert & oriented x 3 with fluent speech, no focal motor/sensory deficits  LABORATORY DATA:  I have reviewed the data as listed CBC Latest Ref Rng & Units 12/18/2020 12/08/2020 12/04/2020  WBC 4.0 - 10.5 K/uL 6.9 3.3(L) 1.3(L)  Hemoglobin 12.0 - 15.0 g/dL 10.2(L) 9.7(L) 9.4(L)  Hematocrit 36.0 - 46.0 % 30.5(L) 29.8(L) 28.0(L)  Platelets 150 - 400 K/uL 507(H) 139(L) 60(L)     CMP Latest Ref Rng & Units 12/18/2020 12/08/2020 12/04/2020  Glucose 70 - 99 mg/dL 93 94 96  BUN 8 - 23 mg/dL _0 Creatinine 0.44 - 1.00 mg/dL 0.78 0.75 0.73  Sodium 135 - 145 mmol/L 137 135 135  Potassium 3.5 - 5.1 mmol/L 3.3(L) 2.8(L) 3.5  Chloride 98 - 111 mmol/L 103 104 104  CO2 22 - 32 mmol/L 20(L) 19(L) 19(L)  Calcium 8.9 - 10.3 mg/dL 8.9 8.5(L) 8.7(L)  Total Protein 6.5 - 8.1 g/dL 6.5 6.2(L) 6.7  Total Bilirubin 0.3 - 1.2 mg/dL 0.6 0.6 0.7  Alkaline Phos 38 - 126 U/L 168(H) 245(H) 314(H)  AST 15 - 41 U/L 22 86(H) 203(HH)  ALT 0 - 44 U/L 40 249(H) 248(H)      RADIOGRAPHIC STUDIES: I have personally reviewed the radiological images as listed and agreed with the findings in the report. No results found.   ASSESSMENT & PLAN:  Tara Rodgers is a 75 y.o. female with   1.Right small cell lung cancer with diffuse metastasis to lung, thoracic LNs, liver,pancrease, andperitoneal/mesenteric metastasis -ShehadCOVID in 10/2019 and has had some breathing changes since with progressive SOB and mild cough. She also has increasing right chest/flank pain.  -HerCT chest from 1/13/22showed numerous bilateral pulmonary nodules and masses, most prominently in the right middle lobe with dominant 4.1 cmmass withBulky mediastinal, thoracic inlet, right supraclavicular, and left axillary lymphadenopathy consistent with metastatic disease. Scan also shows16 mm lesioninright  liveralong withMultiple nodules in the upper abdomen concerningfor metastatic disease. There is also a13 mm hypoenhancing lesion in the tail of pancreas. -Her PET from 1/28/22confirmed diffuse metastatic disease. Her 11/13/20 biopsy of right supraclavicular LN showed small cell lung cancer, TTF-1(+). Her cancer is stage IV and no longer curable but still treatable. She does have right skull metastasis, not present in brain on 12/01/20 MRI.  -I discussed the overall poor prognosis,given the aggressive nature of small cell lung cancer, and high disease burden.She was on [place] oxygen around the time she was diagnosed, off oxygen now  -She received palliative radiation to mediastinum and right supraclavicular, due to significant disease burden and compression to her airway. -I started her on first-line chemo withetoposideD1-3,carboplatin and atezolizumabD1 q3weeks beginning2/14/22.   Her overall condition did improve after chemo and radiation, and her right supraclavicular lymph nodes has significantly decreased in size -Cycle 2 chemotherapy was postponed for a week to allow her recover more, however her symptoms (fatigue, anorexia etc) has became worse again since last week -Lab reviewed, adequate for treatment, will proceed to cycle 2 chemotherapy today, will increase her carboplatin and etoposide dose  -f/u next week   2. Dysphagia/Odynophagia, secondary toradiation esophagitis, anorexia and weight loss -Due to severe pain she underwent Palliative RT to mediastinum. She developed Odynophagia onset s/p Radiation  -Her chest pain improved after Radiation and with Sucralfate and Hycet, but notes increase of her right side pain. She notes her pain flares with movements. I recommend she proceed with long acting Fentanyl patch and for breakthrough pain she can use short acting hycet as needed.  -She has increased labored breathing again, which pt related to uncontrolled pain.  -She is able to get  food down better but still gets caught. She has no appetite with low food and liquid intake. I called in Megace to help stimulate her appetite (12/18/20). She is agreeable.    3. Bone Mets -Her 2/18/22MRI brainwhichshowed nobrain mets, butdoes have bone metastasisin herrightskullbase. She has no head paincurrently. If pain presents she may have palliative RT.   4. Goal of care discussion, DNR/DNI -We again discussed  the incurable nature of his/her cancer, and the overall poor prognosis, especially if he/she does not have good response to chemotherapy or progress on chemo -The patient understands the goal of care is palliative.  5. Hypomagnesia, secondary to low food intake -I recommend she start oral mag today    PLAN -I refilled Hycet and called in Megace and magnesium today  -Labs reviewed and adequate to proceed with C2 Carbo/etopside today, continue Etopside on day 2,3, chemo dose increased  -IV Fluids today, 3/8 and 3/9 -f/u next week with IVF  -will call her daughter to update her    No problem-specific Assessment & Plan notes found for this encounter.   No orders of the defined types were placed in this encounter.  All questions were answered. The patient knows to call the clinic with any problems, questions or concerns. No barriers to learning was detected. The total time spent in the appointment was 30 minutes.     [name], MD 12/18/2020   I, Joslyn Devon, am acting as scribe for [name], MD.   I have reviewed the above documentation for accuracy and completeness, and I agree with the above.

## 2020-12-16 ENCOUNTER — Other Ambulatory Visit: Payer: Self-pay | Admitting: Hematology

## 2020-12-18 ENCOUNTER — Inpatient Hospital Stay (HOSPITAL_BASED_OUTPATIENT_CLINIC_OR_DEPARTMENT_OTHER): Payer: Medicare Other | Admitting: Hematology

## 2020-12-18 ENCOUNTER — Other Ambulatory Visit: Payer: Self-pay | Admitting: Hematology

## 2020-12-18 ENCOUNTER — Encounter: Payer: Self-pay | Admitting: Hematology

## 2020-12-18 ENCOUNTER — Other Ambulatory Visit: Payer: Self-pay

## 2020-12-18 ENCOUNTER — Inpatient Hospital Stay: Payer: Medicare Other

## 2020-12-18 ENCOUNTER — Inpatient Hospital Stay: Payer: Medicare Other | Admitting: Nutrition

## 2020-12-18 VITALS — BP 108/73 | HR 111 | Temp 97.6°F | Resp 18

## 2020-12-18 DIAGNOSIS — I10 Essential (primary) hypertension: Secondary | ICD-10-CM | POA: Diagnosis not present

## 2020-12-18 DIAGNOSIS — Z5111 Encounter for antineoplastic chemotherapy: Secondary | ICD-10-CM | POA: Diagnosis not present

## 2020-12-18 DIAGNOSIS — C50411 Malignant neoplasm of upper-outer quadrant of right female breast: Secondary | ICD-10-CM | POA: Diagnosis not present

## 2020-12-18 DIAGNOSIS — C50911 Malignant neoplasm of unspecified site of right female breast: Secondary | ICD-10-CM | POA: Diagnosis not present

## 2020-12-18 DIAGNOSIS — Z7189 Other specified counseling: Secondary | ICD-10-CM

## 2020-12-18 DIAGNOSIS — C7800 Secondary malignant neoplasm of unspecified lung: Secondary | ICD-10-CM | POA: Diagnosis not present

## 2020-12-18 DIAGNOSIS — C3490 Malignant neoplasm of unspecified part of unspecified bronchus or lung: Secondary | ICD-10-CM

## 2020-12-18 DIAGNOSIS — C7951 Secondary malignant neoplasm of bone: Secondary | ICD-10-CM | POA: Diagnosis not present

## 2020-12-18 DIAGNOSIS — Z9011 Acquired absence of right breast and nipple: Secondary | ICD-10-CM | POA: Diagnosis not present

## 2020-12-18 DIAGNOSIS — M8580 Other specified disorders of bone density and structure, unspecified site: Secondary | ICD-10-CM | POA: Diagnosis not present

## 2020-12-18 DIAGNOSIS — Z170 Estrogen receptor positive status [ER+]: Secondary | ICD-10-CM | POA: Diagnosis not present

## 2020-12-18 DIAGNOSIS — Z79811 Long term (current) use of aromatase inhibitors: Secondary | ICD-10-CM | POA: Diagnosis not present

## 2020-12-18 DIAGNOSIS — M1990 Unspecified osteoarthritis, unspecified site: Secondary | ICD-10-CM | POA: Diagnosis not present

## 2020-12-18 DIAGNOSIS — Z853 Personal history of malignant neoplasm of breast: Secondary | ICD-10-CM | POA: Diagnosis not present

## 2020-12-18 DIAGNOSIS — Z95828 Presence of other vascular implants and grafts: Secondary | ICD-10-CM

## 2020-12-18 DIAGNOSIS — Z923 Personal history of irradiation: Secondary | ICD-10-CM | POA: Diagnosis not present

## 2020-12-18 LAB — CBC WITH DIFFERENTIAL (CANCER CENTER ONLY)
Abs Immature Granulocytes: 0.58 10*3/uL — ABNORMAL HIGH (ref 0.00–0.07)
Basophils Absolute: 0.1 10*3/uL (ref 0.0–0.1)
Basophils Relative: 1 %
Eosinophils Absolute: 0 10*3/uL (ref 0.0–0.5)
Eosinophils Relative: 0 %
HCT: 30.5 % — ABNORMAL LOW (ref 36.0–46.0)
Hemoglobin: 10.2 g/dL — ABNORMAL LOW (ref 12.0–15.0)
Immature Granulocytes: 8 %
Lymphocytes Relative: 17 %
Lymphs Abs: 1.2 10*3/uL (ref 0.7–4.0)
MCH: 27.6 pg (ref 26.0–34.0)
MCHC: 33.4 g/dL (ref 30.0–36.0)
MCV: 82.4 fL (ref 80.0–100.0)
Monocytes Absolute: 1.2 10*3/uL — ABNORMAL HIGH (ref 0.1–1.0)
Monocytes Relative: 17 %
Neutro Abs: 3.9 10*3/uL (ref 1.7–7.7)
Neutrophils Relative %: 56 %
Platelet Count: 507 10*3/uL — ABNORMAL HIGH (ref 150–400)
RBC: 3.7 MIL/uL — ABNORMAL LOW (ref 3.87–5.11)
RDW: 15.4 % (ref 11.5–15.5)
WBC Count: 6.9 10*3/uL (ref 4.0–10.5)
nRBC: 0 % (ref 0.0–0.2)

## 2020-12-18 LAB — CMP (CANCER CENTER ONLY)
ALT: 40 U/L (ref 0–44)
AST: 22 U/L (ref 15–41)
Albumin: 3.1 g/dL — ABNORMAL LOW (ref 3.5–5.0)
Alkaline Phosphatase: 168 U/L — ABNORMAL HIGH (ref 38–126)
Anion gap: 14 (ref 5–15)
BUN: 8 mg/dL (ref 8–23)
CO2: 20 mmol/L — ABNORMAL LOW (ref 22–32)
Calcium: 8.9 mg/dL (ref 8.9–10.3)
Chloride: 103 mmol/L (ref 98–111)
Creatinine: 0.78 mg/dL (ref 0.44–1.00)
GFR, Estimated: >60 mL/min (ref >60)
Glucose, Bld: 93 mg/dL (ref 70–99)
Potassium: 3.3 mmol/L — ABNORMAL LOW (ref 3.5–5.1)
Sodium: 137 mmol/L (ref 135–145)
Total Bilirubin: 0.6 mg/dL (ref 0.3–1.2)
Total Protein: 6.5 g/dL (ref 6.5–8.1)

## 2020-12-18 LAB — TSH: TSH: 25.771 u[IU]/mL — ABNORMAL HIGH (ref 0.308–3.960)

## 2020-12-18 LAB — MAGNESIUM: Magnesium: 1.6 mg/dL — ABNORMAL LOW (ref 1.7–2.4)

## 2020-12-18 LAB — T4, FREE: Free T4: 1.14 ng/dL — ABNORMAL HIGH (ref 0.61–1.12)

## 2020-12-18 MED ORDER — HEPARIN SOD (PORK) LOCK FLUSH 100 UNIT/ML IV SOLN
500.0000 [IU] | Freq: Once | INTRAVENOUS | Status: AC | PRN
  Administered 2020-12-18: 500 [IU]
  Filled 2020-12-18: qty 5

## 2020-12-18 MED ORDER — SODIUM CHLORIDE 0.9% FLUSH
10.0000 mL | Freq: Once | INTRAVENOUS | Status: AC
  Administered 2020-12-18: 10 mL
  Filled 2020-12-18: qty 10

## 2020-12-18 MED ORDER — HOT PACK MISC ONCOLOGY
1.0000 | Freq: Once | Status: DC | PRN
  Filled 2020-12-18: qty 1

## 2020-12-18 MED ORDER — HYDROCODONE-ACETAMINOPHEN 7.5-325 MG/15ML PO SOLN: 10.0000 mL | ORAL | 0 refills | Status: AC | PRN

## 2020-12-18 MED ORDER — SODIUM CHLORIDE 0.9 % IV SOLN
INTRAVENOUS | Status: AC
  Filled 2020-12-18 (×2): qty 250

## 2020-12-18 MED ORDER — MEGESTROL ACETATE 625 MG/5ML PO SUSP: 625.0000 mg | Freq: Every day | ORAL | 0 refills | Status: AC

## 2020-12-18 MED ORDER — SODIUM CHLORIDE 0.9 % IV SOLN
10.0000 mg | Freq: Once | INTRAVENOUS | Status: AC
  Administered 2020-12-18: 10 mg via INTRAVENOUS
  Filled 2020-12-18: qty 10

## 2020-12-18 MED ORDER — SODIUM CHLORIDE 0.9 % IV SOLN
100.0000 mg/m2 | Freq: Once | INTRAVENOUS | Status: AC
  Administered 2020-12-18: 160 mg via INTRAVENOUS
  Filled 2020-12-18: qty 8

## 2020-12-18 MED ORDER — SODIUM CHLORIDE 0.9 % IV SOLN
1200.0000 mg | Freq: Once | INTRAVENOUS | Status: AC
  Administered 2020-12-18: 1200 mg via INTRAVENOUS
  Filled 2020-12-18: qty 20

## 2020-12-18 MED ORDER — PALONOSETRON HCL INJECTION 0.25 MG/5ML
INTRAVENOUS | Status: AC
  Filled 2020-12-18: qty 5

## 2020-12-18 MED ORDER — SODIUM CHLORIDE 0.9 % IV SOLN
150.0000 mg | Freq: Once | INTRAVENOUS | Status: AC
  Administered 2020-12-18: 150 mg via INTRAVENOUS
  Filled 2020-12-18: qty 150

## 2020-12-18 MED ORDER — SODIUM CHLORIDE 0.9% FLUSH
10.0000 mL | INTRAVENOUS | Status: DC | PRN
  Administered 2020-12-18: 10 mL
  Filled 2020-12-18: qty 10

## 2020-12-18 MED ORDER — PALONOSETRON HCL INJECTION 0.25 MG/5ML
0.2500 mg | Freq: Once | INTRAVENOUS | Status: AC
  Administered 2020-12-18: 0.25 mg via INTRAVENOUS

## 2020-12-18 MED ORDER — SODIUM CHLORIDE 0.9 % IV SOLN
290.0000 mg | Freq: Once | INTRAVENOUS | Status: AC
  Administered 2020-12-18: 290 mg via INTRAVENOUS
  Filled 2020-12-18: qty 29

## 2020-12-18 NOTE — Patient Instructions (Signed)
  [facility] Discharge Instructions for Patients Receiving Chemotherapy  Today you received the following chemotherapy agents Ateziolizumab(Tencentriq), Carboplatin, Etoposide.  To help prevent nausea and vomiting after your treatment, we encourage you to take your nausea medication as directed.   If you develop nausea and vomiting that is not controlled by your nausea medication, call the clinic.   BELOW ARE SYMPTOMS THAT SHOULD BE REPORTED IMMEDIATELY:  *FEVER GREATER THAN 100.5 F  *CHILLS WITH OR WITHOUT FEVER  NAUSEA AND VOMITING THAT IS NOT CONTROLLED WITH YOUR NAUSEA MEDICATION  *UNUSUAL SHORTNESS OF BREATH  *UNUSUAL BRUISING OR BLEEDING  TENDERNESS IN MOUTH AND THROAT WITH OR WITHOUT PRESENCE OF ULCERS  *URINARY PROBLEMS  *BOWEL PROBLEMS  UNUSUAL RASH Items with * indicate a potential emergency and should be followed up as soon as possible.  Feel free to call the clinic should you have any questions or concerns. The clinic phone number is [phone].  Please show the [facility] at check-in to the Emergency Department and triage nurse.

## 2020-12-18 NOTE — Telephone Encounter (Signed)
For your approval or refusal. Gardiner Rhyme, RN

## 2020-12-18 NOTE — Progress Notes (Signed)
Nutrition follow-up completed with patient during infusion for small cell lung cancer. Weight decreased and was documented as 119.38 pounds March 2. Noted labs: Potassium 3.3, albumin 3.1, magnesium 1.6. Patient reports she just cannot make herself eat. Reports early satiety.  She is not pushing oral intake. She denies nausea and vomiting. Agrees to try Ensure Plus thinned down with water and ice today. MD ordered Megace today to help with increased appetite.  Nutrition diagnosis: Unintended weight loss continues.  Intervention: Provided support and encouragement for patient to try to push oral intake a little bit.  Explained importance of trying to take sips in small amounts throughout treatment today.  It is doubtful patient will be able to increase calories and protein adequately to minimize further weight loss.  Patient is just unwilling to push oral intake.  Monitoring, evaluation, goals: Will continue to work with patient to try to increase overall calories and protein.  Next visit: To be scheduled.  **Disclaimer: This note was dictated with voice recognition software. Similar sounding words can inadvertently be transcribed and this note may contain transcription errors which may not have been corrected upon publication of note.**

## 2020-12-18 NOTE — Progress Notes (Signed)
Okay to tx with elevated HR per Dr. [name].

## 2020-12-18 NOTE — Progress Notes (Signed)
Port remains in place for treatment tomorrow. Heparin locked and secured. Redressed with biopatch and senstive dressing.

## 2020-12-18 NOTE — Progress Notes (Signed)
Confirmed dose increase with MD. Etoposide will increase to 100mg /m2 and Carbo to AUC 4.  Acquanetta Belling, RPH, BCPS, BCOP 12/18/2020 3:25 PM

## 2020-12-19 ENCOUNTER — Other Ambulatory Visit: Payer: Self-pay

## 2020-12-19 ENCOUNTER — Other Ambulatory Visit: Payer: Self-pay | Admitting: Oncology

## 2020-12-19 ENCOUNTER — Inpatient Hospital Stay: Payer: Medicare Other

## 2020-12-19 VITALS — BP 126/67 | HR 112 | Temp 97.8°F | Resp 18 | Wt 121.4 lb

## 2020-12-19 DIAGNOSIS — Z853 Personal history of malignant neoplasm of breast: Secondary | ICD-10-CM | POA: Diagnosis not present

## 2020-12-19 DIAGNOSIS — Z79811 Long term (current) use of aromatase inhibitors: Secondary | ICD-10-CM | POA: Diagnosis not present

## 2020-12-19 DIAGNOSIS — M8580 Other specified disorders of bone density and structure, unspecified site: Secondary | ICD-10-CM | POA: Diagnosis not present

## 2020-12-19 DIAGNOSIS — Z170 Estrogen receptor positive status [ER+]: Secondary | ICD-10-CM | POA: Diagnosis not present

## 2020-12-19 DIAGNOSIS — Z923 Personal history of irradiation: Secondary | ICD-10-CM | POA: Diagnosis not present

## 2020-12-19 DIAGNOSIS — C7800 Secondary malignant neoplasm of unspecified lung: Secondary | ICD-10-CM | POA: Diagnosis not present

## 2020-12-19 DIAGNOSIS — Z9011 Acquired absence of right breast and nipple: Secondary | ICD-10-CM | POA: Diagnosis not present

## 2020-12-19 DIAGNOSIS — C3490 Malignant neoplasm of unspecified part of unspecified bronchus or lung: Secondary | ICD-10-CM

## 2020-12-19 DIAGNOSIS — M1990 Unspecified osteoarthritis, unspecified site: Secondary | ICD-10-CM | POA: Diagnosis not present

## 2020-12-19 DIAGNOSIS — C7951 Secondary malignant neoplasm of bone: Secondary | ICD-10-CM | POA: Diagnosis not present

## 2020-12-19 DIAGNOSIS — C50411 Malignant neoplasm of upper-outer quadrant of right female breast: Secondary | ICD-10-CM | POA: Diagnosis not present

## 2020-12-19 DIAGNOSIS — I10 Essential (primary) hypertension: Secondary | ICD-10-CM | POA: Diagnosis not present

## 2020-12-19 DIAGNOSIS — Z7189 Other specified counseling: Secondary | ICD-10-CM

## 2020-12-19 DIAGNOSIS — Z5111 Encounter for antineoplastic chemotherapy: Secondary | ICD-10-CM | POA: Diagnosis not present

## 2020-12-19 MED ORDER — SODIUM CHLORIDE 0.9% FLUSH
10.0000 mL | INTRAVENOUS | Status: DC | PRN
  Administered 2020-12-19: 10 mL
  Filled 2020-12-19: qty 10

## 2020-12-19 MED ORDER — SODIUM CHLORIDE 0.9 % IV SOLN
Freq: Once | INTRAVENOUS | Status: DC
  Filled 2020-12-19: qty 250

## 2020-12-19 MED ORDER — SODIUM CHLORIDE 0.9 % IV SOLN
INTRAVENOUS | Status: AC
  Filled 2020-12-19 (×3): qty 250

## 2020-12-19 MED ORDER — SODIUM CHLORIDE 0.9 % IV SOLN
100.0000 mg/m2 | Freq: Once | INTRAVENOUS | Status: AC
  Administered 2020-12-19: 160 mg via INTRAVENOUS
  Filled 2020-12-19: qty 8

## 2020-12-19 MED ORDER — HEPARIN SOD (PORK) LOCK FLUSH 100 UNIT/ML IV SOLN
500.0000 [IU] | Freq: Once | INTRAVENOUS | Status: AC | PRN
  Administered 2020-12-19: 500 [IU]
  Filled 2020-12-19: qty 5

## 2020-12-19 MED ORDER — DEXAMETHASONE SODIUM PHOSPHATE 10 MG/ML IJ SOLN
INTRAMUSCULAR | Status: AC
  Filled 2020-12-19: qty 1

## 2020-12-19 MED ORDER — SODIUM CHLORIDE 0.9 % IV SOLN
10.0000 mg | Freq: Once | INTRAVENOUS | Status: AC
  Administered 2020-12-19: 10 mg via INTRAVENOUS
  Filled 2020-12-19: qty 10

## 2020-12-19 NOTE — Progress Notes (Signed)
Many thanks for taking care of her. She has been a patient here a long time and this was surprising.

## 2020-12-19 NOTE — Patient Instructions (Signed)
[facility] Discharge Instructions for Patients Receiving Chemotherapy  Today you received the following chemotherapy agents: etoposide  To help prevent nausea and vomiting after your treatment, we encourage you to take your nausea medication as directed.   If you develop nausea and vomiting that is not controlled by your nausea medication, call the clinic.   BELOW ARE SYMPTOMS THAT SHOULD BE REPORTED IMMEDIATELY:  *FEVER GREATER THAN 100.5 F  *CHILLS WITH OR WITHOUT FEVER  NAUSEA AND VOMITING THAT IS NOT CONTROLLED WITH YOUR NAUSEA MEDICATION  *UNUSUAL SHORTNESS OF BREATH  *UNUSUAL BRUISING OR BLEEDING  TENDERNESS IN MOUTH AND THROAT WITH OR WITHOUT PRESENCE OF ULCERS  *URINARY PROBLEMS  *BOWEL PROBLEMS  UNUSUAL RASH Items with * indicate a potential emergency and should be followed up as soon as possible.  Feel free to call the clinic should you have any questions or concerns. The clinic phone number is [phone].  Please show the CHEMO ALERT CARD at check-in to the Emergency Department and triage nurse.   

## 2020-12-19 NOTE — Progress Notes (Signed)
Ok to treat with HR of 112 per Dr. Lindi Adie.

## 2020-12-20 ENCOUNTER — Inpatient Hospital Stay: Payer: Medicare Other

## 2020-12-20 ENCOUNTER — Other Ambulatory Visit: Payer: Self-pay

## 2020-12-20 VITALS — BP 113/60 | HR 96 | Temp 98.0°F | Resp 18

## 2020-12-20 DIAGNOSIS — C3490 Malignant neoplasm of unspecified part of unspecified bronchus or lung: Secondary | ICD-10-CM

## 2020-12-20 DIAGNOSIS — Z853 Personal history of malignant neoplasm of breast: Secondary | ICD-10-CM | POA: Diagnosis not present

## 2020-12-20 DIAGNOSIS — Z5111 Encounter for antineoplastic chemotherapy: Secondary | ICD-10-CM | POA: Diagnosis not present

## 2020-12-20 DIAGNOSIS — C7800 Secondary malignant neoplasm of unspecified lung: Secondary | ICD-10-CM | POA: Diagnosis not present

## 2020-12-20 DIAGNOSIS — Z7189 Other specified counseling: Secondary | ICD-10-CM

## 2020-12-20 DIAGNOSIS — Z79811 Long term (current) use of aromatase inhibitors: Secondary | ICD-10-CM | POA: Diagnosis not present

## 2020-12-20 DIAGNOSIS — M1990 Unspecified osteoarthritis, unspecified site: Secondary | ICD-10-CM | POA: Diagnosis not present

## 2020-12-20 DIAGNOSIS — Z170 Estrogen receptor positive status [ER+]: Secondary | ICD-10-CM | POA: Diagnosis not present

## 2020-12-20 DIAGNOSIS — C7951 Secondary malignant neoplasm of bone: Secondary | ICD-10-CM | POA: Diagnosis not present

## 2020-12-20 DIAGNOSIS — C50411 Malignant neoplasm of upper-outer quadrant of right female breast: Secondary | ICD-10-CM | POA: Diagnosis not present

## 2020-12-20 DIAGNOSIS — M8580 Other specified disorders of bone density and structure, unspecified site: Secondary | ICD-10-CM | POA: Diagnosis not present

## 2020-12-20 DIAGNOSIS — Z923 Personal history of irradiation: Secondary | ICD-10-CM | POA: Diagnosis not present

## 2020-12-20 DIAGNOSIS — Z9011 Acquired absence of right breast and nipple: Secondary | ICD-10-CM | POA: Diagnosis not present

## 2020-12-20 DIAGNOSIS — I10 Essential (primary) hypertension: Secondary | ICD-10-CM | POA: Diagnosis not present

## 2020-12-20 MED ORDER — SODIUM CHLORIDE 0.9 % IV SOLN
100.0000 mg/m2 | Freq: Once | INTRAVENOUS | Status: AC
  Administered 2020-12-20: 160 mg via INTRAVENOUS
  Filled 2020-12-20: qty 8

## 2020-12-20 MED ORDER — SODIUM CHLORIDE 0.9 % IV SOLN
10.0000 mg | Freq: Once | INTRAVENOUS | Status: AC
  Administered 2020-12-20: 10 mg via INTRAVENOUS
  Filled 2020-12-20: qty 10

## 2020-12-20 MED ORDER — SODIUM CHLORIDE 0.9% FLUSH
10.0000 mL | INTRAVENOUS | Status: DC | PRN
  Administered 2020-12-20: 10 mL
  Filled 2020-12-20: qty 10

## 2020-12-20 MED ORDER — SODIUM CHLORIDE 0.9 % IV SOLN
INTRAVENOUS | Status: AC
  Filled 2020-12-20 (×2): qty 250

## 2020-12-20 MED ORDER — SODIUM CHLORIDE 0.9 % IV SOLN
INTRAVENOUS | Status: DC
  Filled 2020-12-20: qty 250

## 2020-12-20 MED ORDER — HEPARIN SOD (PORK) LOCK FLUSH 100 UNIT/ML IV SOLN
500.0000 [IU] | Freq: Once | INTRAVENOUS | Status: AC | PRN
  Administered 2020-12-20: 500 [IU]
  Filled 2020-12-20: qty 5

## 2020-12-20 NOTE — Patient Instructions (Signed)
[facility] Discharge Instructions for Patients Receiving Chemotherapy  Today you received the following chemotherapy agents: etoposide  To help prevent nausea and vomiting after your treatment, we encourage you to take your nausea medication as directed.   If you develop nausea and vomiting that is not controlled by your nausea medication, call the clinic.   BELOW ARE SYMPTOMS THAT SHOULD BE REPORTED IMMEDIATELY:  *FEVER GREATER THAN 100.5 F  *CHILLS WITH OR WITHOUT FEVER  NAUSEA AND VOMITING THAT IS NOT CONTROLLED WITH YOUR NAUSEA MEDICATION  *UNUSUAL SHORTNESS OF BREATH  *UNUSUAL BRUISING OR BLEEDING  TENDERNESS IN MOUTH AND THROAT WITH OR WITHOUT PRESENCE OF ULCERS  *URINARY PROBLEMS  *BOWEL PROBLEMS  UNUSUAL RASH Items with * indicate a potential emergency and should be followed up as soon as possible.  Feel free to call the clinic should you have any questions or concerns. The clinic phone number is [phone].  Please show the CHEMO ALERT CARD at check-in to the Emergency Department and triage nurse.   

## 2020-12-21 ENCOUNTER — Telehealth (INDEPENDENT_AMBULATORY_CARE_PROVIDER_SITE_OTHER): Payer: Medicare Other | Admitting: Internal Medicine

## 2020-12-21 ENCOUNTER — Encounter: Payer: Self-pay | Admitting: Internal Medicine

## 2020-12-21 VITALS — Ht 59.0 in

## 2020-12-21 DIAGNOSIS — R7989 Other specified abnormal findings of blood chemistry: Secondary | ICD-10-CM

## 2020-12-21 NOTE — Progress Notes (Signed)
   Subjective:    Patient ID: Tara Rodgers, female    DOB: June 26, 1946, 75 y.o.   MRN: [id number]  HPI 75 year old Female seen today via interactive audio and video telecommunications due to it being difficult for her to travel to the office during her lung cancer treatment and also due to the Coronavirus pandemic.  She is identified using 2 identifiers as Tara Rodgers. [name] a longstanding patient in this practice.  She is agreeable to visit in this format today.  She is at her home and I am at my office.  Patient has history of small cell lung cancer and is undergoing treatment per Dr. [name].  She has had issues with nausea and vomiting.  She has a longstanding history of hypothyroidism.  In November 2021 her TSH was normal at 0.63.  It was checked again in February 2022 and had risen to 13.545.  It was not clear what was causing this elevation in her TSH.  It has been repeated multiple times since then and has ranged up to 27.865.  Dr. [name] and  I have discussed increasing her thyroid replacement dose.  Levothyroxine will be increased to 0.125 mg daily and we will follow-up on this in 6 weeks.  Addendum: Dr. [name] checked her TSH on January 01, 2021 and it had decreased to 1.969.  However her free T4 March 28 was 2.64.  She will follow-up here in  April.  Not sure about the absorption of this medication.  Has been told to take it on an empty stomach.  Has had considerable nausea on chemotherapy.  Sometimes generic preparations have variable absorption.    Review of Systems chemotherapy has been rough for her.  She looks fatigued.  She has been nauseated.  She has had issues with volume depletion and has had to receive IV fluids.     Objective:   Physical Exam Not examined but reportedly her vital signs have been stable.  She looks fatigued on the video visit but is able to give a clear concise history.       Assessment & Plan:  Elevated TSH  Plan: Increase levothyroxine to 0.125 mg daily  and follow-up here in 6 weeks if she is up to it.

## 2020-12-22 ENCOUNTER — Inpatient Hospital Stay: Payer: Medicare Other

## 2020-12-22 ENCOUNTER — Other Ambulatory Visit: Payer: Self-pay

## 2020-12-22 ENCOUNTER — Telehealth: Payer: Self-pay | Admitting: Internal Medicine

## 2020-12-22 VITALS — BP 131/75 | HR 115 | Temp 97.8°F | Resp 18

## 2020-12-22 DIAGNOSIS — M8580 Other specified disorders of bone density and structure, unspecified site: Secondary | ICD-10-CM | POA: Diagnosis not present

## 2020-12-22 DIAGNOSIS — C3490 Malignant neoplasm of unspecified part of unspecified bronchus or lung: Secondary | ICD-10-CM

## 2020-12-22 DIAGNOSIS — Z5111 Encounter for antineoplastic chemotherapy: Secondary | ICD-10-CM | POA: Diagnosis not present

## 2020-12-22 DIAGNOSIS — C7800 Secondary malignant neoplasm of unspecified lung: Secondary | ICD-10-CM | POA: Diagnosis not present

## 2020-12-22 DIAGNOSIS — C7951 Secondary malignant neoplasm of bone: Secondary | ICD-10-CM | POA: Diagnosis not present

## 2020-12-22 DIAGNOSIS — I10 Essential (primary) hypertension: Secondary | ICD-10-CM | POA: Diagnosis not present

## 2020-12-22 DIAGNOSIS — Z170 Estrogen receptor positive status [ER+]: Secondary | ICD-10-CM | POA: Diagnosis not present

## 2020-12-22 DIAGNOSIS — Z853 Personal history of malignant neoplasm of breast: Secondary | ICD-10-CM | POA: Diagnosis not present

## 2020-12-22 DIAGNOSIS — Z7189 Other specified counseling: Secondary | ICD-10-CM

## 2020-12-22 DIAGNOSIS — Z9011 Acquired absence of right breast and nipple: Secondary | ICD-10-CM | POA: Diagnosis not present

## 2020-12-22 DIAGNOSIS — C50411 Malignant neoplasm of upper-outer quadrant of right female breast: Secondary | ICD-10-CM | POA: Diagnosis not present

## 2020-12-22 DIAGNOSIS — Z923 Personal history of irradiation: Secondary | ICD-10-CM | POA: Diagnosis not present

## 2020-12-22 DIAGNOSIS — Z79811 Long term (current) use of aromatase inhibitors: Secondary | ICD-10-CM | POA: Diagnosis not present

## 2020-12-22 DIAGNOSIS — M1990 Unspecified osteoarthritis, unspecified site: Secondary | ICD-10-CM | POA: Diagnosis not present

## 2020-12-22 MED ORDER — PEGFILGRASTIM-CBQV 6 MG/0.6ML ~~LOC~~ SOSY
6.0000 mg | PREFILLED_SYRINGE | Freq: Once | SUBCUTANEOUS | Status: AC
  Administered 2020-12-22: 6 mg via SUBCUTANEOUS

## 2020-12-22 MED ORDER — LEVOTHYROXINE SODIUM 125 MCG PO TABS: 125.0000 ug | ORAL_TABLET | Freq: Every day | ORAL | 0 refills | Status: AC

## 2020-12-22 MED ORDER — PEGFILGRASTIM-CBQV 6 MG/0.6ML ~~LOC~~ SOSY
PREFILLED_SYRINGE | SUBCUTANEOUS | Status: AC
  Filled 2020-12-22: qty 0.6

## 2020-12-22 NOTE — Addendum Note (Signed)
Addended by: Mady Haagensen on: 12/22/2020 02:46 PM   Modules accepted: Orders

## 2020-12-22 NOTE — Telephone Encounter (Signed)
Tara Rodgers  [phone]  Destyn was calling to inquire about medication being called in for her thyroid after her video visit yesterday.

## 2020-12-22 NOTE — Progress Notes (Signed)
Tara Rodgers   [phone] [phone]   Clinic Follow up Note   Patient Care Team: [name], [name] [name], MD as PCP - General (Internal Medicine) [name], MD as Consulting Physician (Hematology) [name], MD (Inactive) as Consulting Physician (General Surgery) [name], NP as Nurse Practitioner (Nurse Practitioner) [name], MD as Consulting Physician (Plastic Surgery) [name], MD as Consulting Physician (Orthopedic Surgery)  Date of Service:  12/26/2020  CHIEF COMPLAINT: F/u of metastaticRight small cell lung cancer  SUMMARY OF ONCOLOGIC HISTORY: 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 [facility]) Staging form: Breast, AJCC 7th Edition - Clinical: Stage IIA (T2, N0, M0) - Signed by [name], MD on 02/08/2015 Laterality: Right Estrogen receptor status: Positive Progesterone receptor status: Positive HER2 status: Negative - Pathologic stage from 03/23/2015: Stage Unknown (T2, NX, cM0) - Unsigned  Small cell lung cancer ([place]) Staging form: Lung, AJCC 8th Edition - Clinical stage from 11/13/2020: Stage IV (cTX, cN3, cM1) - Signed by [name], MD on 11/16/2020 Stage prefix: Initial diagnosis     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) and adjuvant radiation ([name]).   12/13/2014 Mammogram   Right breast: possible asymmetry warranting further evaluation with spot compression views and possibly ultrasound   12/16/2014 Breast US   Right breast: no definitive abnormality within the superior aspect of the right breast to correspond with the mammographic finding.   01/10/2015 Breast MRI   Right breast: irregular  rim enhancing mass within the superior right breast at 11:30 measuring 2.5 x 2.4 x 2.4 cm. No abnormal enhancement is identified within the skin of the superior right breast in the area of concern on mammogram.   01/12/2015 Breast US   Second look diagnostic mammogram and ultrasound showed a 2.3 cm mass in the upper midline right breast. No axillary adenopathy.   01/12/2015 Initial Biopsy   Right breast needle core bx (upper midline): Invasive ductal carcinoma, grade 2, ER+ (100%), PR+ (77%), HER2/neu negative (ratio 1.15), Ki67 20%    01/30/2015 Procedure   Breast High/Moderate Risk panel ([facility]) reveals no clinically significant variant at ATM, BRCA1, BRCA2, CDH1, CHEK2, PALB2, PTEN, STK11, and TP53.   02/08/2015 Clinical Stage   Stage IIA (T2 N0)   03/23/2015 Definitive Surgery   Right mastectomy ([name]): invasive adenocarcinoma, grade 3, 2.9 cm, HER2/neu repeated, negative (ratio 1.23)   03/23/2015 Oncotype testing   Score: 8 (6% ROR). No chemotherapy [name]).   03/23/2015 Pathologic Stage   Stage IIA: pT2 pNx   05/04/2015 -  Anti-estrogen oral therapy   Letrozole 2.$RemoveBefo'5mg'zbwQeMOlgko$  daily. Planned duration of therapy at least 5 years.    07/25/2015 Survivorship   Survivorship visit completed and copy of care plan provided to patient.   12/15/2015 Mammogram   IMPRESSION: No mammographic evidence of malignancy. A result letter of this screening mammogram will be mailed directly to the patient.   12/23/2016 Mammogram   IMPRESSION: No mammographic evidence of malignancy. A result letter of this screening mammogram will be mailed directly to the patient.    12/30/2017 Mammogram   12/30/2017 Mammogram IMPRESSION: No mammographic evidence of malignancy. A result letter of this screening mammogram will be mailed directly to the patient.   03/27/2020 Mammogram  IMPRESSION: No mammographic evidence of malignancy. A result letter of this screening mammogram will be mailed directly to the patient.    10/26/2020 Imaging   CT chest IMPRESSION: 1. Bulky mediastinal, thoracic inlet, right supraclavicular, and left axillary lymphadenopathy consistent with metastatic disease. Associated numerous bilateral pulmonary nodules and masses, most prominently in the right middle lobe with dominant 4.1 cm peripheral mass lesion. 2. 16 mm rim enhancing lesion posterior right liver consistent with metastatic involvement. 3. 13 mm hypoenhancing lesion in the tail of pancreas. Primary neoplasm or metastatic involvement could have this appearance. 4. Multiple nodules in the upper abdomen concerning for peritoneal/mesenteric metastatic disease. 5. Small right and tiny left pleural effusions with bibasilar atelectasis. 6. Aortic Atherosclerosis (ICD10-I70.0).   11/10/2020 PET scan   IMPRESSION: 1. Extensive hypermetabolic multi organ metastatic disease. 2. Bulky intensely hypermetabolic RIGHT supraclavicular, mediastinal and LEFT axillary adenopathy. 3. Hypermetabolic pulmonary mass in the RIGHT upper lobe with additional hypermetabolic nodules. 4. Hypermetabolic solitary hepatic metastasis. 5. Hypermetabolic lesion within the pancreas is also favored metastatic lesion. 6. Multifocal hypermetabolic skeletal metastasis. 7. Bilateral pleural effusions.   11/13/2020 Procedure   Thoracentesis  IMPRESSION: Successful ultrasound guided right thoracentesis yielding 200 mL of pleural fluid.   Small cell lung cancer ([place])  11/13/2020 Pathology Results   Right supraclavicular LN Biopsy  FINAL MICROSCOPIC DIAGNOSIS:   A. LYMPH NODE, RIGHT SUPRACLAVICULAR, NEEDLE CORE BIOPSY:  - Most consistent with small cell carcinoma.   COMMENT:   Tumor is limited in quantity, and partially necrotic.  TTF-1 and  Synaptophysin are positive.  Ki-67 proliferation index reaches 80-90%.  GATA-3, ER and p40 are negative.  The morphologic and immunophenotypic  characteristics are most suggestive of small cell  carcinoma.  TTF-1  positive staining is compatible with origin from the lung; however, it  does not exclude origin from other entities.  Please note that distinct  nodal tissue is not identified in the submitted material.  Results  reported to Dr. [name] on 11/16/2020.  Dr. Melina Copa reviewed the case   11/13/2020 Cancer Staging   Staging form: Lung, AJCC 8th Edition - Clinical stage from 11/13/2020: Stage IV (cTX, cN3, cM1) - Signed by [name], MD on 11/16/2020 Stage prefix: Initial diagnosis   11/13/2020 - 11/28/2020 Radiation Therapy   palliative radiation to [facility] with Dr Lisbeth Renshaw 11/13/20-11/28/20   11/16/2020 Initial Diagnosis   Small cell lung cancer ([place])   11/22/2020 -  Chemotherapy   First line etoposide D1-3, carboplatin and atezolizumab D1 q3weeks beginning 11/22/20.        11/29/2020 Procedure   PAC placed on 11/29/20      CURRENT THERAPY:  First lineetoposideD1-3,carboplatin and atezolizumabD1 q3weeks beginning 11/22/20.  INTERVAL HISTORY:  Tara Rodgers is here for a follow up. She presents to the clinic with her husband and aughter and come in a wheelchair.  Tolerated chemo OK last week Low appetite, eats small amount, she started Megace 4-5 days ago, limited benefit  She tried Ensure, Booster etc, could not keep them down, odynophagia has much improved  She is on fentyla patch now, pain is well controlled, not taking hycocet any more  Weight stable lately   All other systems were reviewed with the patient and are negative.  MEDICAL HISTORY:  Past Medical History:  Diagnosis Date  . Anxiety   . Asthma    does not take any medications, hx of inhaler use  . Breast cancer [facility]) 1995/2016   right sided breast cancer in 08/1994  .  Bronchitis    hx of  . GERD (gastroesophageal reflux disease)   . History of hiatal hernia   . Hypertension   . Hyperthyroidism   . Lung cancer ([place]) 11/02/2020    SURGICAL HISTORY: Past Surgical History:  Procedure Laterality  Date  . BREAST BIOPSY Right 2016   malignant  . BREAST EXCISIONAL BIOPSY Left 1996   benign  . Carpel Tunnel  Bilateral   . INCONTINENCE SURGERY     with mesh insertion about 7 years ago  . IR IMAGING GUIDED PORT INSERTION  11/29/2020  . LATISSIMUS FLAP TO BREAST Right 03/23/2015   Procedure: LATISSIMUS FLAP TO BREAST WITH PLACEMENT OF IMPLANT FOR BREAST RECONSTRUCTION;  Surgeon: [name], MD;  Location: [place];  Service: Plastics;  Laterality: Right;  . MASTECTOMY Right 03/23/2015  . RECONSTRUCTION BREAST W/ LATISSIMUS DORSI FLAP Right 03/23/2015  . TONSILLECTOMY     as a child  . TOTAL KNEE ARTHROPLASTY Right 03/13/2018   Procedure: RIGHT TOTAL KNEE ARTHROPLASTY;  Surgeon: Sydnee Cabal, MD;  Location: [facility];  Service: Orthopedics;  Laterality: Right;  Adductor Block  . TOTAL MASTECTOMY Right 03/23/2015   Procedure: RIGHT TOTAL MASTECTOMY;  Surgeon: [name], MD;  Location: [place] [facility];  Service: General;  Laterality: Right;    I have reviewed the social history and family history with the patient and they are unchanged from previous note.  ALLERGIES:  is allergic to fosamax [alendronate sodium].  MEDICATIONS:  Current Outpatient Medications  Medication Sig Dispense Refill  . dronabinol (MARINOL) 2.5 MG capsule Take 1 capsule (2.5 mg total) by mouth 2 (two) times daily before a meal. 60 capsule 0  . allopurinol (ZYLOPRIM) 300 MG tablet Take 1 tablet (300 mg total) by mouth daily. 30 tablet 1  . ALPRAZolam (XANAX) 0.5 MG tablet Take 1 tablet by mouth twice daily as needed 180 tablet 0  . cyclobenzaprine (FLEXERIL) 10 MG tablet Take 1 tablet (10 mg total) by mouth 3 (three) times daily as needed for muscle spasms. 30 tablet 2  . esomeprazole (NEXIUM) 40 MG capsule One po daily at bedtime (Patient taking differently: Take 40 mg by mouth daily.) 90 capsule 1  . fentaNYL (DURAGESIC) 12 MCG/HR Place 1 patch onto the skin every 3 (three) days. 10 patch 0  . HYDROcodone-acetaminophen  (HYCET) 7.5-325 mg/15 ml solution Take 10-15 mLs by mouth every 4 (four) hours as needed for moderate pain or severe pain. 473 mL 0  . letrozole (FEMARA) 2.5 MG tablet Take 1 tablet (2.5 mg total) by mouth daily. 90 tablet 3  . levothyroxine (SYNTHROID) 125 MCG tablet Take 1 tablet (125 mcg total) by mouth daily. 90 tablet 0  . lidocaine (LIDODERM) 5 % Place 1 patch onto the skin daily. Remove & Discard patch within 12 hours or as directed by MD 30 patch 0  . lidocaine-prilocaine (EMLA) cream Apply 1 application topically as needed. 30 g 3  . megestrol (MEGACE ES) 625 MG/5ML suspension Take 5 mLs (625 mg total) by mouth daily. 150 mL 0  . meloxicam (MOBIC) 7.5 MG tablet Take 7.5 mg by mouth 2 (two) times daily as needed for pain.    . metoprolol succinate (TOPROL-XL) 50 MG 24 hr tablet Take 1 tablet by mouth twice daily (Patient taking differently: Take 50 mg by mouth 2 (two) times daily.) 60 tablet 1  . ondansetron (ZOFRAN) 8 MG tablet Take 1 tablet (8 mg total) by mouth 2 (two) times daily as needed for refractory nausea /  vomiting. Start on day 3 after carboplatin chemo. 30 tablet 1  . potassium chloride (MICRO-K) 10 MEQ CR capsule Take 1 capsule (10 mEq total) by mouth 2 (two) times daily. 30 capsule 2  . potassium chloride SA (KLOR-CON) 20 MEQ tablet Take 1 tablet (20 mEq total) by mouth 2 (two) times daily. 30 tablet 0  . prochlorperazine (COMPAZINE) 10 MG tablet Take 1 tablet (10 mg total) by mouth every 6 (six) hours as needed (Nausea or vomiting). 30 tablet 1  . senna-docusate (SENOKOT-S) 8.6-50 MG tablet Take 1 tablet by mouth daily. 30 tablet 0  . simvastatin (ZOCOR) 40 MG tablet TAKE 1 TABLET BY MOUTH AT BEDTIME (Patient taking differently: Take by mouth daily at 6 (six) AM.) 90 tablet 0  . sucralfate (CARAFATE) 1 g tablet Take 1 tablet (1 g total) by mouth 4 (four) times daily. Dissolve each tablet in 15 cc water before use. 120 tablet 2  . verapamil (CALAN-SR) 240 MG CR tablet Take 1  tablet by mouth once daily (Patient taking differently: Take 240 mg by mouth daily.) 90 tablet 0   No current facility-administered medications for this visit.   Facility-Administered Medications Ordered in Other Visits  Medication Dose Route Frequency Provider Last Rate Last Admin  . 0.9 %  sodium chloride infusion   Intravenous Continuous [name], MD   Stopped at 12/26/20 1114    PHYSICAL EXAMINATION: ECOG PERFORMANCE STATUS: 3 - Symptomatic, >50% confined to bed  Vitals:   12/26/20 0858  BP: 112/80  Pulse: (!) 141  Resp: 16  Temp: 98.5 F (36.9 C)  SpO2: 100%   There were no vitals filed for this visit.  GENERAL:alert, mild respiratory distress, in a wheelchair, chronic ill appearing  SKIN: skin color, texture, turgor are normal, no rashes or significant lesions EYES: normal, Conjunctiva are pink and non-injected, sclera clear NECK: supple, thyroid normal size, non-tender, without nodularity LYMPH:  no palpable lymphadenopathy in the cervical, axillary  LUNGS: clear to auscultation and percussion with normal breathing effort HEART: regular rate & rhythm and no murmurs and no lower extremity edema ABDOMEN:abdomen soft, non-tender and normal bowel sounds Musculoskeletal:no cyanosis of digits and no clubbing  NEURO: alert & oriented x 3 with fluent speech, no focal motor/sensory deficits  LABORATORY DATA:  I have reviewed the data as listed CBC Latest Ref Rng & Units 12/26/2020 12/18/2020 12/08/2020  WBC 4.0 - 10.5 K/uL 0.3(LL) 6.9 3.3(L)  Hemoglobin 12.0 - 15.0 g/dL 9.5(L) 10.2(L) 9.7(L)  Hematocrit 36.0 - 46.0 % 29.4(L) 30.5(L) 29.8(L)  Platelets 150 - 400 K/uL 100(L) 507(H) 139(L)     CMP Latest Ref Rng & Units 12/26/2020 12/18/2020 12/08/2020  Glucose 70 - 99 mg/dL 99 93 94  BUN 8 - 23 mg/dL _0 Creatinine 0.44 - 1.00 mg/dL 0.82 0.78 0.75  Sodium 135 - 145 mmol/L 133(L) 137 135  Potassium 3.5 - 5.1 mmol/L 4.1 3.3(L) 2.8(L)  Chloride 98 - 111 mmol/L 106 103 104   CO2 22 - 32 mmol/L 16(L) 20(L) 19(L)  Calcium 8.9 - 10.3 mg/dL 8.9 8.9 8.5(L)  Total Protein 6.5 - 8.1 g/dL 6.6 6.5 6.2(L)  Total Bilirubin 0.3 - 1.2 mg/dL 1.5(H) 0.6 0.6  Alkaline Phos 38 - 126 U/L 140(H) 168(H) 245(H)  AST 15 - 41 U/L 60(H) 22 86(H)  ALT 0 - 44 U/L 202(H) 40 249(H)      RADIOGRAPHIC STUDIES: I have personally reviewed the radiological images as listed and agreed with the findings  in the report. No results found.   ASSESSMENT & PLAN:  Tara Rodgers is a 75 y.o. 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 She also has increasing right chest/flank pain.  -HerCT chest from 1/13/22showed numerous bilateral pulmonary nodules and masses, most prominently in the right middle lobe with dominant 4.1 cmmass withBulky mediastinal, thoracic inlet, right supraclavicular, and left axillary lymphadenopathy consistent with metastatic disease. Scan also shows16 mm lesioninright liveralong withMultiple nodules in the upper abdomen concerningfor metastatic disease. There is also a13 mm hypoenhancing lesion in the tail of pancreas. -Her PET from 1/28/22confirmed diffuse metastatic disease. Her 11/13/20 biopsy of right supraclavicular LN showed small cell lung cancer, TTF-1(+). Her cancer is stage IV and no longer curable but still treatable. She does have right skull metastasis, not present in brain on 12/01/20 MRI.  -I discussed the overall poor prognosis,given the aggressive nature of small cell lung cancer, and high disease burden.She was on [place] oxygen around the time she was diagnosed, off oxygen now  -She received palliative radiation to mediastinum and right supraclavicular, due to significant disease burden and compression to her airway. -I started her on  first-line chemo withetoposideD1-3,carboplatin and atezolizumabD1 q3weeks beginning2/14/22.Her overall condition did improve after chemo and radiation, and her right supraclavicular lymph nodes has significantly decreased in size.  -S/p C2 chemo, she overall tolerated it well, no significant nausea or change of her appetite. -Lab reviewed, she is severely neutropenic, received GCSF on 3/11, we reviewed neutropenia precaution. -Plan to scan her before cycle 3 with cycle 4 for chemo response evaluation -Although some of her symptoms has improved, she is overall very frail, performance status not improved.  Patient and her family wish to continue chemo since it has help some of her symptoms  -next cycle chemo on 3/28    2. anorexia and weight loss -her dysphagia and odynophagia after radiation has much improved -For pain management, I started her on long acting Fentanyl patch which has helped a lot, she is not using liquid hydrocodone much -She is able to get food down better but still gets caught sometime. For low appetite I started her on Megace (12/18/20).  she states it's not helping her muc -I called in Marinol 2.5 mg twice daily to her pharmacy today, we discussed the benefits and side effects, she will try.   3. Bone Mets -Her2/18/22MRI brainwhichshowed nobrain mets, butdoes have bone metastasisin herrightskullbase. She has no head paincurrently. If pain presents she may have palliative RT.   4.Goal of care discussion, DNR/DNI -We previously discussed the incurable nature of her cancer, and the overall poor prognosis, especially if she does not have good response to chemotherapy or progress on chemo -The patient understands the goal of care is palliative.    PLAN -2h IVF today and next week -lab, flush, f/u and cycle 3 chemo carboplatin, etoposide, Tecentriq on March 28 -CT chest, abdomen pelvis with contrast before cycle 3 or cycle 4 -I called in Marinol for  her appetite today   No problem-specific Assessment & Plan notes found for this encounter.   Orders Placed This Encounter  Procedures  . CT CHEST ABDOMEN PELVIS W CONTRAST    If it can not be done around 3/25, then schedule 2-3 weeks after that    Standing Status:   Future  Standing Expiration Date:   12/26/2021    Order Specific Question:   If indicated for the ordered procedure, I authorize the administration of contrast media per Radiology protocol    Answer:   Yes    Order Specific Question:   Preferred imaging location?    Answer:   [facility] [address]    Order Specific Question:   Release to patient    Answer:   Immediate    Order Specific Question:   Is Oral Contrast requested for this exam?    Answer:   Yes, Per Radiology protocol    Order Specific Question:   Reason for Exam (SYMPTOM  OR DIAGNOSIS REQUIRED)    Answer:   evaluate response to chemo   All questions were answered. The patient knows to call the clinic with any problems, questions or concerns. No barriers to learning was detected. The total time spent in the appointment was 30 minutes.     [name], MD 12/26/2020   I, Joslyn Devon, am acting as scribe for [name], MD.   I have reviewed the above documentation for accuracy and completeness, and I agree with the above.

## 2020-12-22 NOTE — Telephone Encounter (Signed)
What dose can I send in for her?

## 2020-12-22 NOTE — Telephone Encounter (Signed)
Send in levothyroxine 125 mcg daily #90 with no refill. See her and get TSH in 4 weeks.

## 2020-12-22 NOTE — Patient Instructions (Signed)
Pegfilgrastim injection What is this medicine? PEGFILGRASTIM (PEG fil gra stim) is a long-acting granulocyte colony-stimulating factor that stimulates the growth of neutrophils, a type of white blood cell important in the body's fight against infection. It is used to reduce the incidence of fever and infection in patients with certain types of cancer who are receiving chemotherapy that affects the bone marrow, and to increase survival after being exposed to high doses of radiation. This medicine may be used for other purposes; ask your health care provider or pharmacist if you have questions. COMMON BRAND NAME(S): Fulphila, Neulasta, Nyvepria, UDENYCA, Ziextenzo What should I tell my health care provider before I take this medicine? They need to know if you have any of these conditions:  kidney disease  latex allergy  ongoing radiation therapy  sickle cell disease  skin reactions to acrylic adhesives (On-Body Injector only)  an unusual or allergic reaction to pegfilgrastim, filgrastim, other medicines, foods, dyes, or preservatives  pregnant or trying to get pregnant  breast-feeding How should I use this medicine? This medicine is for injection under the skin. If you get this medicine at home, you will be taught how to prepare and give the pre-filled syringe or how to use the On-body Injector. Refer to the patient Instructions for Use for detailed instructions. Use exactly as directed. Tell your healthcare provider immediately if you suspect that the On-body Injector may not have performed as intended or if you suspect the use of the On-body Injector resulted in a missed or partial dose. It is important that you put your used needles and syringes in a special sharps container. Do not put them in a trash can. If you do not have a sharps container, call your pharmacist or healthcare provider to get one. Talk to your pediatrician regarding the use of this medicine in children. While this drug  may be prescribed for selected conditions, precautions do apply. Overdosage: If you think you have taken too much of this medicine contact a poison control center or emergency room at once. NOTE: This medicine is only for you. Do not share this medicine with others. What if I miss a dose? It is important not to miss your dose. Call your doctor or health care professional if you miss your dose. If you miss a dose due to an On-body Injector failure or leakage, a new dose should be administered as soon as possible using a single prefilled syringe for manual use. What may interact with this medicine? Interactions have not been studied. This list may not describe all possible interactions. Give your health care provider a list of all the medicines, herbs, non-prescription drugs, or dietary supplements you use. Also tell them if you smoke, drink alcohol, or use illegal drugs. Some items may interact with your medicine. What should I watch for while using this medicine? Your condition will be monitored carefully while you are receiving this medicine. You may need blood work done while you are taking this medicine. Talk to your health care provider about your risk of cancer. You may be more at risk for certain types of cancer if you take this medicine. If you are going to need a MRI, CT scan, or other procedure, tell your doctor that you are using this medicine (On-Body Injector only). What side effects may I notice from receiving this medicine? Side effects that you should report to your doctor or health care professional as soon as possible:  allergic reactions (skin rash, itching or hives, swelling of   the face, lips, or tongue)  back pain  dizziness  fever  pain, redness, or irritation at site where injected  pinpoint red spots on the skin  red or dark-brown urine  shortness of breath or breathing problems  stomach or side pain, or pain at the shoulder  swelling  tiredness  trouble  passing urine or change in the amount of urine  unusual bruising or bleeding Side effects that usually do not require medical attention (report to your doctor or health care professional if they continue or are bothersome):  bone pain  muscle pain This list may not describe all possible side effects. Call your doctor for medical advice about side effects. You may report side effects to FDA at 1-800-FDA-1088. Where should I keep my medicine? Keep out of the reach of children. If you are using this medicine at home, you will be instructed on how to store it. Throw away any unused medicine after the expiration date on the label. NOTE: This sheet is a summary. It may not cover all possible information. If you have questions about this medicine, talk to your doctor, pharmacist, or health care provider.  2021 [employer] (2019-10-22 13:20:51)  

## 2020-12-23 MED ORDER — PEGFILGRASTIM-JMDB 6 MG/0.6ML ~~LOC~~ SOSY
PREFILLED_SYRINGE | SUBCUTANEOUS | Status: AC
  Filled 2020-12-23: qty 0.6

## 2020-12-24 ENCOUNTER — Other Ambulatory Visit: Payer: Self-pay | Admitting: Internal Medicine

## 2020-12-25 ENCOUNTER — Other Ambulatory Visit: Payer: Self-pay | Admitting: Hematology

## 2020-12-25 DIAGNOSIS — C7889 Secondary malignant neoplasm of other digestive organs: Secondary | ICD-10-CM | POA: Diagnosis not present

## 2020-12-25 DIAGNOSIS — Z9011 Acquired absence of right breast and nipple: Secondary | ICD-10-CM | POA: Diagnosis not present

## 2020-12-25 DIAGNOSIS — T50915D Adverse effect of multiple unspecified drugs, medicaments and biological substances, subsequent encounter: Secondary | ICD-10-CM | POA: Diagnosis not present

## 2020-12-25 DIAGNOSIS — C771 Secondary and unspecified malignant neoplasm of intrathoracic lymph nodes: Secondary | ICD-10-CM | POA: Diagnosis not present

## 2020-12-25 DIAGNOSIS — Z9181 History of falling: Secondary | ICD-10-CM | POA: Diagnosis not present

## 2020-12-25 DIAGNOSIS — C787 Secondary malignant neoplasm of liver and intrahepatic bile duct: Secondary | ICD-10-CM | POA: Diagnosis not present

## 2020-12-25 DIAGNOSIS — F419 Anxiety disorder, unspecified: Secondary | ICD-10-CM | POA: Diagnosis not present

## 2020-12-25 DIAGNOSIS — E039 Hypothyroidism, unspecified: Secondary | ICD-10-CM | POA: Diagnosis not present

## 2020-12-25 DIAGNOSIS — Z9981 Dependence on supplemental oxygen: Secondary | ICD-10-CM | POA: Diagnosis not present

## 2020-12-25 DIAGNOSIS — C3491 Malignant neoplasm of unspecified part of right bronchus or lung: Secondary | ICD-10-CM | POA: Diagnosis not present

## 2020-12-25 DIAGNOSIS — K5903 Drug induced constipation: Secondary | ICD-10-CM | POA: Diagnosis not present

## 2020-12-25 DIAGNOSIS — I10 Essential (primary) hypertension: Secondary | ICD-10-CM | POA: Diagnosis not present

## 2020-12-25 DIAGNOSIS — K219 Gastro-esophageal reflux disease without esophagitis: Secondary | ICD-10-CM | POA: Diagnosis not present

## 2020-12-25 DIAGNOSIS — Z853 Personal history of malignant neoplasm of breast: Secondary | ICD-10-CM | POA: Diagnosis not present

## 2020-12-25 DIAGNOSIS — J45909 Unspecified asthma, uncomplicated: Secondary | ICD-10-CM | POA: Diagnosis not present

## 2020-12-25 DIAGNOSIS — M8588 Other specified disorders of bone density and structure, other site: Secondary | ICD-10-CM | POA: Diagnosis not present

## 2020-12-26 ENCOUNTER — Inpatient Hospital Stay: Payer: Medicare Other

## 2020-12-26 ENCOUNTER — Encounter: Payer: Self-pay | Admitting: Hematology

## 2020-12-26 ENCOUNTER — Inpatient Hospital Stay (HOSPITAL_BASED_OUTPATIENT_CLINIC_OR_DEPARTMENT_OTHER): Payer: Medicare Other | Admitting: Hematology

## 2020-12-26 ENCOUNTER — Other Ambulatory Visit: Payer: Self-pay

## 2020-12-26 VITALS — BP 112/80 | HR 141 | Temp 98.5°F | Resp 16 | Ht 59.0 in

## 2020-12-26 VITALS — BP 112/80 | HR 113 | Temp 98.5°F | Resp 16 | Ht 59.0 in

## 2020-12-26 DIAGNOSIS — C7800 Secondary malignant neoplasm of unspecified lung: Secondary | ICD-10-CM | POA: Diagnosis not present

## 2020-12-26 DIAGNOSIS — Z9011 Acquired absence of right breast and nipple: Secondary | ICD-10-CM | POA: Diagnosis not present

## 2020-12-26 DIAGNOSIS — C50411 Malignant neoplasm of upper-outer quadrant of right female breast: Secondary | ICD-10-CM | POA: Diagnosis not present

## 2020-12-26 DIAGNOSIS — C3490 Malignant neoplasm of unspecified part of unspecified bronchus or lung: Secondary | ICD-10-CM

## 2020-12-26 DIAGNOSIS — E860 Dehydration: Secondary | ICD-10-CM

## 2020-12-26 DIAGNOSIS — C50911 Malignant neoplasm of unspecified site of right female breast: Secondary | ICD-10-CM

## 2020-12-26 DIAGNOSIS — Z170 Estrogen receptor positive status [ER+]: Secondary | ICD-10-CM | POA: Diagnosis not present

## 2020-12-26 DIAGNOSIS — I10 Essential (primary) hypertension: Secondary | ICD-10-CM | POA: Diagnosis not present

## 2020-12-26 DIAGNOSIS — Z853 Personal history of malignant neoplasm of breast: Secondary | ICD-10-CM | POA: Diagnosis not present

## 2020-12-26 DIAGNOSIS — M8580 Other specified disorders of bone density and structure, unspecified site: Secondary | ICD-10-CM | POA: Diagnosis not present

## 2020-12-26 DIAGNOSIS — Z5111 Encounter for antineoplastic chemotherapy: Secondary | ICD-10-CM | POA: Diagnosis not present

## 2020-12-26 DIAGNOSIS — Z95828 Presence of other vascular implants and grafts: Secondary | ICD-10-CM

## 2020-12-26 DIAGNOSIS — Z79811 Long term (current) use of aromatase inhibitors: Secondary | ICD-10-CM | POA: Diagnosis not present

## 2020-12-26 DIAGNOSIS — Z923 Personal history of irradiation: Secondary | ICD-10-CM | POA: Diagnosis not present

## 2020-12-26 DIAGNOSIS — Z7189 Other specified counseling: Secondary | ICD-10-CM

## 2020-12-26 DIAGNOSIS — C7951 Secondary malignant neoplasm of bone: Secondary | ICD-10-CM | POA: Diagnosis not present

## 2020-12-26 DIAGNOSIS — M1990 Unspecified osteoarthritis, unspecified site: Secondary | ICD-10-CM | POA: Diagnosis not present

## 2020-12-26 LAB — CMP (CANCER CENTER ONLY)
ALT: 202 U/L — ABNORMAL HIGH (ref 0–44)
AST: 60 U/L — ABNORMAL HIGH (ref 15–41)
Albumin: 3.5 g/dL (ref 3.5–5.0)
Alkaline Phosphatase: 140 U/L — ABNORMAL HIGH (ref 38–126)
Anion gap: 11 (ref 5–15)
BUN: 14 mg/dL (ref 8–23)
CO2: 16 mmol/L — ABNORMAL LOW (ref 22–32)
Calcium: 8.9 mg/dL (ref 8.9–10.3)
Chloride: 106 mmol/L (ref 98–111)
Creatinine: 0.82 mg/dL (ref 0.44–1.00)
GFR, Estimated: >60 mL/min (ref >60)
Glucose, Bld: 99 mg/dL (ref 70–99)
Potassium: 4.1 mmol/L (ref 3.5–5.1)
Sodium: 133 mmol/L — ABNORMAL LOW (ref 135–145)
Total Bilirubin: 1.5 mg/dL — ABNORMAL HIGH (ref 0.3–1.2)
Total Protein: 6.6 g/dL (ref 6.5–8.1)

## 2020-12-26 LAB — CBC WITH DIFFERENTIAL (CANCER CENTER ONLY)
Abs Immature Granulocytes: 0 10*3/uL (ref 0.00–0.07)
Basophils Absolute: 0 10*3/uL (ref 0.0–0.1)
Basophils Relative: 4 %
Eosinophils Absolute: 0 10*3/uL (ref 0.0–0.5)
Eosinophils Relative: 4 %
HCT: 29.4 % — ABNORMAL LOW (ref 36.0–46.0)
Hemoglobin: 9.5 g/dL — ABNORMAL LOW (ref 12.0–15.0)
Immature Granulocytes: 0 %
Lymphocytes Relative: 72 %
Lymphs Abs: 0.2 10*3/uL — ABNORMAL LOW (ref 0.7–4.0)
MCH: 26.8 pg (ref 26.0–34.0)
MCHC: 32.3 g/dL (ref 30.0–36.0)
MCV: 82.8 fL (ref 80.0–100.0)
Monocytes Absolute: 0 10*3/uL — ABNORMAL LOW (ref 0.1–1.0)
Monocytes Relative: 8 %
Neutro Abs: 0 10*3/uL — CL (ref 1.7–7.7)
Neutrophils Relative %: 12 %
Platelet Count: 100 10*3/uL — ABNORMAL LOW (ref 150–400)
RBC: 3.55 MIL/uL — ABNORMAL LOW (ref 3.87–5.11)
RDW: 15 % (ref 11.5–15.5)
WBC Count: 0.3 10*3/uL — CL (ref 4.0–10.5)
nRBC: 0 % (ref 0.0–0.2)

## 2020-12-26 LAB — MAGNESIUM: Magnesium: 1.6 mg/dL — ABNORMAL LOW (ref 1.7–2.4)

## 2020-12-26 LAB — TSH: TSH: 12.514 u[IU]/mL — ABNORMAL HIGH (ref 0.308–3.960)

## 2020-12-26 MED ORDER — FENTANYL 12 MCG/HR TD PT72: 1.0000 | MEDICATED_PATCH | TRANSDERMAL | 0 refills | Status: DC

## 2020-12-26 MED ORDER — HEPARIN SOD (PORK) LOCK FLUSH 100 UNIT/ML IV SOLN
500.0000 [IU] | Freq: Once | INTRAVENOUS | Status: AC
  Administered 2020-12-26: 500 [IU]
  Filled 2020-12-26: qty 5

## 2020-12-26 MED ORDER — SODIUM CHLORIDE 0.9% FLUSH
10.0000 mL | Freq: Once | INTRAVENOUS | Status: AC
  Administered 2020-12-26: 10 mL
  Filled 2020-12-26: qty 10

## 2020-12-26 MED ORDER — DRONABINOL 2.5 MG PO CAPS: 2.5000 mg | ORAL_CAPSULE | Freq: Two times a day (BID) | ORAL | 0 refills | Status: DC

## 2020-12-26 MED ORDER — SODIUM CHLORIDE 0.9 % IV SOLN
INTRAVENOUS | Status: DC
  Filled 2020-12-26 (×2): qty 250

## 2020-12-26 NOTE — Patient Instructions (Signed)
Implanted Port Insertion, Care After This sheet gives you information about how to care for yourself after your procedure. Your health care provider may also give you more specific instructions. If you have problems or questions, contact your health care provider. What can I expect after the procedure? After the procedure, it is common to have:  Discomfort at the port insertion site.  Bruising on the skin over the port. This should improve over 3-4 days. Follow these instructions at home: Port care  After your port is placed, you will get a manufacturer's information card. The card has information about your port. Keep this card with you at all times.  Take care of the port as told by your health care provider. Ask your health care provider if you or a family member can get training for taking care of the port at home. A home health care nurse may also take care of the port.  Make sure to remember what type of port you have. Incision care  Follow instructions from your health care provider about how to take care of your port insertion site. Make sure you: ? Wash your hands with soap and water before and after you change your bandage (dressing). If soap and water are not available, use hand sanitizer. ? Change your dressing as told by your health care provider. ? Leave stitches (sutures), skin glue, or adhesive strips in place. These skin closures may need to stay in place for 2 weeks or longer. If adhesive strip edges start to loosen and curl up, you may trim the loose edges. Do not remove adhesive strips completely unless your health care provider tells you to do that.  Check your port insertion site every day for signs of infection. Check for: ? Redness, swelling, or pain. ? Fluid or blood. ? Warmth. ? Pus or a bad smell.      Activity  Return to your normal activities as told by your health care provider. Ask your health care provider what activities are safe for you.  Do not  lift anything that is heavier than 10 lb (4.5 kg), or the limit that you are told, until your health care provider says that it is safe. General instructions  Take over-the-counter and prescription medicines only as told by your health care provider.  Do not take baths, swim, or use a hot tub until your health care provider approves. Ask your health care provider if you may take showers. You may only be allowed to take sponge baths.  Do not drive for 24 hours if you were given a sedative during your procedure.  Wear a medical alert bracelet in case of an emergency. This will tell any health care providers that you have a port.  Keep all follow-up visits as told by your health care provider. This is important. Contact a health care provider if:  You cannot flush your port with saline as directed, or you cannot draw blood from the port.  You have a fever or chills.  You have redness, swelling, or pain around your port insertion site.  You have fluid or blood coming from your port insertion site.  Your port insertion site feels warm to the touch.  You have pus or a bad smell coming from the port insertion site. Get help right away if:  You have chest pain or shortness of breath.  You have bleeding from your port that you cannot control. Summary  Take care of the port as told by your   health care provider. Keep the manufacturer's information card with you at all times.  Change your dressing as told by your health care provider.  Contact a health care provider if you have a fever or chills or if you have redness, swelling, or pain around your port insertion site.  Keep all follow-up visits as told by your health care provider. This information is not intended to replace advice given to you by your health care provider. Make sure you discuss any questions you have with your health care provider. Document Revised: 04/28/2018 Document Reviewed: 04/28/2018 Elsevier Patient Education   2021 Elsevier Inc.  

## 2020-12-26 NOTE — Progress Notes (Signed)
Critical Value: WBC 0.3 ANC 0.0 Dr [name] notified

## 2020-12-26 NOTE — Telephone Encounter (Signed)
Dr. [name], Looks like you refilled this already so this needs be declined.  Gardiner Rhyme, RN

## 2020-12-26 NOTE — Patient Instructions (Signed)
Rehydration, Adult Rehydration is the replacement of body fluids, salts, and minerals (electrolytes) that are lost during dehydration. Dehydration is when there is not enough water or other fluids in the body. This happens when you lose more fluids than you take in. Common causes of dehydration include:  Not drinking enough fluids. This can occur when you are ill or doing activities that require a lot of energy, especially in hot weather.  Conditions that cause loss of water or other fluids, such as diarrhea, vomiting, sweating, or urinating a lot.  Other illnesses, such as fever or infection.  Certain medicines, such as those that remove excess fluid from the body (diuretics). Symptoms of mild or moderate dehydration may include thirst, dry lips and mouth, and dizziness. Symptoms of severe dehydration may include increased heart rate, confusion, fainting, and not urinating. For severe dehydration, you may need to get fluids through an IV at the hospital. For mild or moderate dehydration, you can usually rehydrate at home by drinking certain fluids as told by your health care provider. What are the risks? Generally, rehydration is safe. However, taking in too much fluid (overhydration) can be a problem. This is rare. Overhydration can cause an electrolyte imbalance, kidney failure, or a decrease in salt (sodium) levels in the body. Supplies needed You will need an oral rehydration solution (ORS) if your health care provider tells you to use one. This is a drink to treat dehydration. It can be found in pharmacies and retail stores. How to rehydrate Fluids Follow instructions from your health care provider for rehydration. The kind of fluid and the amount you should drink depend on your condition. In general, you should choose drinks that you prefer.  If told by your health care provider, drink an ORS. ? Make an ORS by following instructions on the package. ? Start by drinking small amounts,  about  cup (120 mL) every 5-10 minutes. ? Slowly increase how much you drink until you have taken the amount recommended by your health care provider.  Drink enough clear fluids to keep your urine pale yellow. If you were told to drink an ORS, finish it first, then start slowly drinking other clear fluids. Drink fluids such as: ? Water. This includes sparkling water and flavored water. Drinking only water can lead to having too little sodium in your body (hyponatremia). Follow the advice of your health care provider. ? Water from ice chips you suck on. ? Fruit juice with water you add to it (diluted). ? Sports drinks. ? Hot or cold herbal teas. ? Broth-based soups. ? Milk or milk products. Food Follow instructions from your health care provider about what to eat while you rehydrate. Your health care provider may recommend that you slowly begin eating regular foods in small amounts.  Eat foods that contain a healthy balance of electrolytes, such as bananas, oranges, potatoes, tomatoes, and spinach.  Avoid foods that are greasy or contain a lot of sugar. In some cases, you may get nutrition through a feeding tube that is passed through your nose and into your stomach (nasogastric tube, or NG tube). This may be done if you have uncontrolled vomiting or diarrhea.   Beverages to avoid Certain beverages may make dehydration worse. While you rehydrate, avoid drinking alcohol.   How to tell if you are recovering from dehydration You may be recovering from dehydration if:  You are urinating more often than before you started rehydrating.  Your urine is pale yellow.  Your energy level   improves.  You vomit less frequently.  You have diarrhea less frequently.  Your appetite improves or returns to normal.  You feel less dizzy or less light-headed.  Your skin tone and color start to look more normal. Follow these instructions at home:  Take over-the-counter and prescription medicines only  as told by your health care provider.  Do not take sodium tablets. Doing this can lead to having too much sodium in your body (hypernatremia). Contact a health care provider if:  You continue to have symptoms of mild or moderate dehydration, such as: ? Thirst. ? Dry lips. ? Slightly dry mouth. ? Dizziness. ? Dark urine or less urine than normal. ? Muscle cramps.  You continue to vomit or have diarrhea. Get help right away if you:  Have symptoms of dehydration that get worse.  Have a fever.  Have a severe headache.  Have been vomiting and the following happens: ? Your vomiting gets worse or does not go away. ? Your vomit includes blood or green matter (bile). ? You cannot eat or drink without vomiting.  Have problems with urination or bowel movements, such as: ? Diarrhea that gets worse or does not go away. ? Blood in your stool (feces). This may cause stool to look black and tarry. ? Not urinating, or urinating only a small amount of very dark urine, within 6-8 hours.  Have trouble breathing.  Have symptoms that get worse with treatment. These symptoms may represent a serious problem that is an emergency. Do not wait to see if the symptoms will go away. Get medical help right away. Call your local emergency services (911 in the [place] Do not drive yourself to the hospital. Summary  Rehydration is the replacement of body fluids and minerals (electrolytes) that are lost during dehydration.  Follow instructions from your health care provider for rehydration. The kind of fluid and amount you should drink depend on your condition.  Slowly increase how much you drink until you have taken the amount recommended by your health care provider.  Contact your health care provider if you continue to show signs of mild or moderate dehydration. This information is not intended to replace advice given to you by your health care provider. Make sure you discuss any questions you have with  your health care provider. Document Revised: 12/01/2019 Document Reviewed: 10/11/2019 Elsevier Patient Education  2021 [employer]vier Inc.  

## 2020-12-28 ENCOUNTER — Encounter: Payer: Self-pay | Admitting: Hematology

## 2020-12-29 ENCOUNTER — Encounter: Payer: Self-pay | Admitting: Hematology

## 2021-01-01 ENCOUNTER — Inpatient Hospital Stay: Payer: Medicare Other

## 2021-01-01 ENCOUNTER — Other Ambulatory Visit: Payer: Self-pay | Admitting: Hematology

## 2021-01-01 ENCOUNTER — Other Ambulatory Visit: Payer: Self-pay

## 2021-01-01 ENCOUNTER — Ambulatory Visit (HOSPITAL_COMMUNITY)
Admission: RE | Admit: 2021-01-01 | Discharge: 2021-01-01 | Disposition: A | Discharge status: Home / Self Care | Payer: Medicare Other | Source: Ambulatory Visit | Attending: Hematology | Admitting: Hematology

## 2021-01-01 ENCOUNTER — Telehealth: Payer: Self-pay | Admitting: Hematology

## 2021-01-01 ENCOUNTER — Encounter: Payer: Self-pay | Admitting: Hematology

## 2021-01-01 ENCOUNTER — Encounter (HOSPITAL_COMMUNITY): Payer: Self-pay

## 2021-01-01 VITALS — BP 132/58 | HR 122 | Temp 97.7°F | Resp 18

## 2021-01-01 DIAGNOSIS — K769 Liver disease, unspecified: Secondary | ICD-10-CM | POA: Diagnosis not present

## 2021-01-01 DIAGNOSIS — I10 Essential (primary) hypertension: Secondary | ICD-10-CM | POA: Diagnosis not present

## 2021-01-01 DIAGNOSIS — C3490 Malignant neoplasm of unspecified part of unspecified bronchus or lung: Secondary | ICD-10-CM | POA: Insufficient documentation

## 2021-01-01 DIAGNOSIS — C7951 Secondary malignant neoplasm of bone: Secondary | ICD-10-CM | POA: Diagnosis not present

## 2021-01-01 DIAGNOSIS — Z95828 Presence of other vascular implants and grafts: Secondary | ICD-10-CM

## 2021-01-01 DIAGNOSIS — Z9011 Acquired absence of right breast and nipple: Secondary | ICD-10-CM | POA: Diagnosis not present

## 2021-01-01 DIAGNOSIS — C50411 Malignant neoplasm of upper-outer quadrant of right female breast: Secondary | ICD-10-CM | POA: Diagnosis not present

## 2021-01-01 DIAGNOSIS — Z853 Personal history of malignant neoplasm of breast: Secondary | ICD-10-CM | POA: Diagnosis not present

## 2021-01-01 DIAGNOSIS — C7800 Secondary malignant neoplasm of unspecified lung: Secondary | ICD-10-CM | POA: Diagnosis not present

## 2021-01-01 DIAGNOSIS — I771 Stricture of artery: Secondary | ICD-10-CM | POA: Diagnosis not present

## 2021-01-01 DIAGNOSIS — J9859 Other diseases of mediastinum, not elsewhere classified: Secondary | ICD-10-CM | POA: Diagnosis not present

## 2021-01-01 DIAGNOSIS — I708 Atherosclerosis of other arteries: Secondary | ICD-10-CM | POA: Diagnosis not present

## 2021-01-01 DIAGNOSIS — Z79811 Long term (current) use of aromatase inhibitors: Secondary | ICD-10-CM | POA: Diagnosis not present

## 2021-01-01 DIAGNOSIS — Z5111 Encounter for antineoplastic chemotherapy: Secondary | ICD-10-CM | POA: Diagnosis not present

## 2021-01-01 DIAGNOSIS — Z170 Estrogen receptor positive status [ER+]: Secondary | ICD-10-CM | POA: Diagnosis not present

## 2021-01-01 DIAGNOSIS — Z923 Personal history of irradiation: Secondary | ICD-10-CM | POA: Diagnosis not present

## 2021-01-01 DIAGNOSIS — M8580 Other specified disorders of bone density and structure, unspecified site: Secondary | ICD-10-CM | POA: Diagnosis not present

## 2021-01-01 DIAGNOSIS — M1990 Unspecified osteoarthritis, unspecified site: Secondary | ICD-10-CM | POA: Diagnosis not present

## 2021-01-01 DIAGNOSIS — Z7189 Other specified counseling: Secondary | ICD-10-CM

## 2021-01-01 DIAGNOSIS — E860 Dehydration: Secondary | ICD-10-CM

## 2021-01-01 LAB — CBC WITH DIFFERENTIAL (CANCER CENTER ONLY)
Abs Immature Granulocytes: 0.1 10*3/uL — ABNORMAL HIGH (ref 0.00–0.07)
Band Neutrophils: 7 %
Basophils Absolute: 0 10*3/uL (ref 0.0–0.1)
Basophils Relative: 0 %
Blasts: 1 %
Eosinophils Absolute: 0 10*3/uL (ref 0.0–0.5)
Eosinophils Relative: 0 %
HCT: 24.6 % — ABNORMAL LOW (ref 36.0–46.0)
Hemoglobin: 8.4 g/dL — ABNORMAL LOW (ref 12.0–15.0)
Lymphocytes Relative: 6 %
Lymphs Abs: 0.4 10*3/uL — ABNORMAL LOW (ref 0.7–4.0)
MCH: 27.5 pg (ref 26.0–34.0)
MCHC: 34.1 g/dL (ref 30.0–36.0)
MCV: 80.4 fL (ref 80.0–100.0)
Metamyelocytes Relative: 1 %
Monocytes Absolute: 1.2 10*3/uL — ABNORMAL HIGH (ref 0.1–1.0)
Monocytes Relative: 16 %
Neutro Abs: 5.5 10*3/uL (ref 1.7–7.7)
Neutrophils Relative %: 68 %
Platelet Count: 47 10*3/uL — ABNORMAL LOW (ref 150–400)
Promyelocytes Relative: 1 %
RBC: 3.06 MIL/uL — ABNORMAL LOW (ref 3.87–5.11)
RDW: 15.2 % (ref 11.5–15.5)
WBC Count: 7.3 10*3/uL (ref 4.0–10.5)
nRBC: 0.4 % — ABNORMAL HIGH (ref 0.0–0.2)

## 2021-01-01 LAB — CMP (CANCER CENTER ONLY)
ALT: 109 U/L — ABNORMAL HIGH (ref 0–44)
AST: 35 U/L (ref 15–41)
Albumin: 3.3 g/dL — ABNORMAL LOW (ref 3.5–5.0)
Alkaline Phosphatase: 171 U/L — ABNORMAL HIGH (ref 38–126)
Anion gap: 11 (ref 5–15)
BUN: 14 mg/dL (ref 8–23)
CO2: 16 mmol/L — ABNORMAL LOW (ref 22–32)
Calcium: 9.1 mg/dL (ref 8.9–10.3)
Chloride: 103 mmol/L (ref 98–111)
Creatinine: 0.84 mg/dL (ref 0.44–1.00)
GFR, Estimated: >60 mL/min (ref >60)
Glucose, Bld: 97 mg/dL (ref 70–99)
Potassium: 3.6 mmol/L (ref 3.5–5.1)
Sodium: 130 mmol/L — ABNORMAL LOW (ref 135–145)
Total Bilirubin: 0.6 mg/dL (ref 0.3–1.2)
Total Protein: 6.2 g/dL — ABNORMAL LOW (ref 6.5–8.1)

## 2021-01-01 LAB — TSH: TSH: 1.969 u[IU]/mL (ref 0.308–3.960)

## 2021-01-01 MED ORDER — HEPARIN SOD (PORK) LOCK FLUSH 100 UNIT/ML IV SOLN
INTRAVENOUS | Status: AC
  Administered 2021-01-01: 500 [IU]
  Filled 2021-01-01: qty 5

## 2021-01-01 MED ORDER — FENTANYL 12 MCG/HR TD PT72: 1.0000 | MEDICATED_PATCH | TRANSDERMAL | 0 refills | Status: DC

## 2021-01-01 MED ORDER — IOHEXOL 300 MG/ML  SOLN
100.0000 mL | Freq: Once | INTRAMUSCULAR | Status: AC | PRN
  Administered 2021-01-01: 100 mL via INTRAVENOUS

## 2021-01-01 MED ORDER — SODIUM CHLORIDE 0.9% FLUSH
10.0000 mL | Freq: Once | INTRAVENOUS | Status: AC
  Administered 2021-01-01: 10 mL
  Filled 2021-01-01: qty 10

## 2021-01-01 MED ORDER — HEPARIN SOD (PORK) LOCK FLUSH 100 UNIT/ML IV SOLN
500.0000 [IU] | Freq: Once | INTRAVENOUS | Status: AC
  Administered 2021-01-01: 500 [IU]
  Filled 2021-01-01: qty 5

## 2021-01-01 MED ORDER — SODIUM CHLORIDE 0.9 % IV SOLN
Freq: Once | INTRAVENOUS | Status: AC
  Filled 2021-01-01: qty 250

## 2021-01-01 MED ORDER — HEPARIN SOD (PORK) LOCK FLUSH 100 UNIT/ML IV SOLN: 500.0000 [IU] | Freq: Once | INTRAVENOUS | Status: DC

## 2021-01-01 MED FILL — FENTANYL 12 MCG/HR PT72: 12 | 30 days supply | Qty: 10 | Fill #0

## 2021-01-01 NOTE — Patient Instructions (Signed)
Implanted Port Insertion, Care After This sheet gives you information about how to care for yourself after your procedure. Your health care provider may also give you more specific instructions. If you have problems or questions, contact your health care provider. What can I expect after the procedure? After the procedure, it is common to have:  Discomfort at the port insertion site.  Bruising on the skin over the port. This should improve over 3-4 days. Follow these instructions at home: Port care  After your port is placed, you will get a manufacturer's information card. The card has information about your port. Keep this card with you at all times.  Take care of the port as told by your health care provider. Ask your health care provider if you or a family member can get training for taking care of the port at home. A home health care nurse may also take care of the port.  Make sure to remember what type of port you have. Incision care  Follow instructions from your health care provider about how to take care of your port insertion site. Make sure you: ? Wash your hands with soap and water before and after you change your bandage (dressing). If soap and water are not available, use hand sanitizer. ? Change your dressing as told by your health care provider. ? Leave stitches (sutures), skin glue, or adhesive strips in place. These skin closures may need to stay in place for 2 weeks or longer. If adhesive strip edges start to loosen and curl up, you may trim the loose edges. Do not remove adhesive strips completely unless your health care provider tells you to do that.  Check your port insertion site every day for signs of infection. Check for: ? Redness, swelling, or pain. ? Fluid or blood. ? Warmth. ? Pus or a bad smell.      Activity  Return to your normal activities as told by your health care provider. Ask your health care provider what activities are safe for you.  Do not  lift anything that is heavier than 10 lb (4.5 kg), or the limit that you are told, until your health care provider says that it is safe. General instructions  Take over-the-counter and prescription medicines only as told by your health care provider.  Do not take baths, swim, or use a hot tub until your health care provider approves. Ask your health care provider if you may take showers. You may only be allowed to take sponge baths.  Do not drive for 24 hours if you were given a sedative during your procedure.  Wear a medical alert bracelet in case of an emergency. This will tell any health care providers that you have a port.  Keep all follow-up visits as told by your health care provider. This is important. Contact a health care provider if:  You cannot flush your port with saline as directed, or you cannot draw blood from the port.  You have a fever or chills.  You have redness, swelling, or pain around your port insertion site.  You have fluid or blood coming from your port insertion site.  Your port insertion site feels warm to the touch.  You have pus or a bad smell coming from the port insertion site. Get help right away if:  You have chest pain or shortness of breath.  You have bleeding from your port that you cannot control. Summary  Take care of the port as told by your   health care provider. Keep the manufacturer's information card with you at all times.  Change your dressing as told by your health care provider.  Contact a health care provider if you have a fever or chills or if you have redness, swelling, or pain around your port insertion site.  Keep all follow-up visits as told by your health care provider. This information is not intended to replace advice given to you by your health care provider. Make sure you discuss any questions you have with your health care provider. Document Revised: 04/28/2018 Document Reviewed: 04/28/2018 Elsevier Patient Education   2021 Elsevier Inc.  

## 2021-01-01 NOTE — Telephone Encounter (Signed)
Scheduled follow-up appointments per 3/15 los. Patient is aware.

## 2021-01-01 NOTE — Patient Instructions (Signed)
Rehydration, Adult Rehydration is the replacement of body fluids, salts, and minerals (electrolytes) that are lost during dehydration. Dehydration is when there is not enough water or other fluids in the body. This happens when you lose more fluids than you take in. Common causes of dehydration include:  Not drinking enough fluids. This can occur when you are ill or doing activities that require a lot of energy, especially in hot weather.  Conditions that cause loss of water or other fluids, such as diarrhea, vomiting, sweating, or urinating a lot.  Other illnesses, such as fever or infection.  Certain medicines, such as those that remove excess fluid from the body (diuretics). Symptoms of mild or moderate dehydration may include thirst, dry lips and mouth, and dizziness. Symptoms of severe dehydration may include increased heart rate, confusion, fainting, and not urinating. For severe dehydration, you may need to get fluids through an IV at the hospital. For mild or moderate dehydration, you can usually rehydrate at home by drinking certain fluids as told by your health care provider. What are the risks? Generally, rehydration is safe. However, taking in too much fluid (overhydration) can be a problem. This is rare. Overhydration can cause an electrolyte imbalance, kidney failure, or a decrease in salt (sodium) levels in the body. Supplies needed You will need an oral rehydration solution (ORS) if your health care provider tells you to use one. This is a drink to treat dehydration. It can be found in pharmacies and retail stores. How to rehydrate Fluids Follow instructions from your health care provider for rehydration. The kind of fluid and the amount you should drink depend on your condition. In general, you should choose drinks that you prefer.  If told by your health care provider, drink an ORS. ? Make an ORS by following instructions on the package. ? Start by drinking small amounts,  about  cup (120 mL) every 5-10 minutes. ? Slowly increase how much you drink until you have taken the amount recommended by your health care provider.  Drink enough clear fluids to keep your urine pale yellow. If you were told to drink an ORS, finish it first, then start slowly drinking other clear fluids. Drink fluids such as: ? Water. This includes sparkling water and flavored water. Drinking only water can lead to having too little sodium in your body (hyponatremia). Follow the advice of your health care provider. ? Water from ice chips you suck on. ? Fruit juice with water you add to it (diluted). ? Sports drinks. ? Hot or cold herbal teas. ? Broth-based soups. ? Milk or milk products. Food Follow instructions from your health care provider about what to eat while you rehydrate. Your health care provider may recommend that you slowly begin eating regular foods in small amounts.  Eat foods that contain a healthy balance of electrolytes, such as bananas, oranges, potatoes, tomatoes, and spinach.  Avoid foods that are greasy or contain a lot of sugar. In some cases, you may get nutrition through a feeding tube that is passed through your nose and into your stomach (nasogastric tube, or NG tube). This may be done if you have uncontrolled vomiting or diarrhea.   Beverages to avoid Certain beverages may make dehydration worse. While you rehydrate, avoid drinking alcohol.   How to tell if you are recovering from dehydration You may be recovering from dehydration if:  You are urinating more often than before you started rehydrating.  Your urine is pale yellow.  Your energy level   improves.  You vomit less frequently.  You have diarrhea less frequently.  Your appetite improves or returns to normal.  You feel less dizzy or less light-headed.  Your skin tone and color start to look more normal. Follow these instructions at home:  Take over-the-counter and prescription medicines only  as told by your health care provider.  Do not take sodium tablets. Doing this can lead to having too much sodium in your body (hypernatremia). Contact a health care provider if:  You continue to have symptoms of mild or moderate dehydration, such as: ? Thirst. ? Dry lips. ? Slightly dry mouth. ? Dizziness. ? Dark urine or less urine than normal. ? Muscle cramps.  You continue to vomit or have diarrhea. Get help right away if you:  Have symptoms of dehydration that get worse.  Have a fever.  Have a severe headache.  Have been vomiting and the following happens: ? Your vomiting gets worse or does not go away. ? Your vomit includes blood or green matter (bile). ? You cannot eat or drink without vomiting.  Have problems with urination or bowel movements, such as: ? Diarrhea that gets worse or does not go away. ? Blood in your stool (feces). This may cause stool to look black and tarry. ? Not urinating, or urinating only a small amount of very dark urine, within 6-8 hours.  Have trouble breathing.  Have symptoms that get worse with treatment. These symptoms may represent a serious problem that is an emergency. Do not wait to see if the symptoms will go away. Get medical help right away. Call your local emergency services (911 in the [place] Do not drive yourself to the hospital. Summary  Rehydration is the replacement of body fluids and minerals (electrolytes) that are lost during dehydration.  Follow instructions from your health care provider for rehydration. The kind of fluid and amount you should drink depend on your condition.  Slowly increase how much you drink until you have taken the amount recommended by your health care provider.  Contact your health care provider if you continue to show signs of mild or moderate dehydration. This information is not intended to replace advice given to you by your health care provider. Make sure you discuss any questions you have with  your health care provider. Document Revised: 12/01/2019 Document Reviewed: 10/11/2019 Elsevier Patient Education  2021 [employer]vier Inc.  

## 2021-01-02 ENCOUNTER — Other Ambulatory Visit: Payer: Medicare Other

## 2021-01-02 ENCOUNTER — Inpatient Hospital Stay: Payer: Medicare Other

## 2021-01-02 VITALS — BP 121/69 | HR 104 | Temp 97.9°F | Resp 18

## 2021-01-02 DIAGNOSIS — Z9011 Acquired absence of right breast and nipple: Secondary | ICD-10-CM | POA: Diagnosis not present

## 2021-01-02 DIAGNOSIS — Z5111 Encounter for antineoplastic chemotherapy: Secondary | ICD-10-CM | POA: Diagnosis not present

## 2021-01-02 DIAGNOSIS — Z95828 Presence of other vascular implants and grafts: Secondary | ICD-10-CM

## 2021-01-02 DIAGNOSIS — Z79811 Long term (current) use of aromatase inhibitors: Secondary | ICD-10-CM | POA: Diagnosis not present

## 2021-01-02 DIAGNOSIS — Z923 Personal history of irradiation: Secondary | ICD-10-CM | POA: Diagnosis not present

## 2021-01-02 DIAGNOSIS — Z170 Estrogen receptor positive status [ER+]: Secondary | ICD-10-CM | POA: Diagnosis not present

## 2021-01-02 DIAGNOSIS — I10 Essential (primary) hypertension: Secondary | ICD-10-CM | POA: Diagnosis not present

## 2021-01-02 DIAGNOSIS — M1990 Unspecified osteoarthritis, unspecified site: Secondary | ICD-10-CM | POA: Diagnosis not present

## 2021-01-02 DIAGNOSIS — C50411 Malignant neoplasm of upper-outer quadrant of right female breast: Secondary | ICD-10-CM | POA: Diagnosis not present

## 2021-01-02 DIAGNOSIS — E860 Dehydration: Secondary | ICD-10-CM

## 2021-01-02 DIAGNOSIS — Z853 Personal history of malignant neoplasm of breast: Secondary | ICD-10-CM | POA: Diagnosis not present

## 2021-01-02 DIAGNOSIS — C3490 Malignant neoplasm of unspecified part of unspecified bronchus or lung: Secondary | ICD-10-CM

## 2021-01-02 DIAGNOSIS — M8580 Other specified disorders of bone density and structure, unspecified site: Secondary | ICD-10-CM | POA: Diagnosis not present

## 2021-01-02 DIAGNOSIS — C7800 Secondary malignant neoplasm of unspecified lung: Secondary | ICD-10-CM | POA: Diagnosis not present

## 2021-01-02 DIAGNOSIS — C7951 Secondary malignant neoplasm of bone: Secondary | ICD-10-CM | POA: Diagnosis not present

## 2021-01-02 MED ORDER — HEPARIN SOD (PORK) LOCK FLUSH 100 UNIT/ML IV SOLN
500.0000 [IU] | Freq: Once | INTRAVENOUS | Status: AC
  Administered 2021-01-02: 500 [IU]
  Filled 2021-01-02: qty 5

## 2021-01-02 MED ORDER — SODIUM CHLORIDE 0.9 % IV SOLN
Freq: Once | INTRAVENOUS | Status: AC
  Filled 2021-01-02: qty 250

## 2021-01-02 MED ORDER — SODIUM CHLORIDE 0.9% FLUSH
10.0000 mL | Freq: Once | INTRAVENOUS | Status: AC
  Administered 2021-01-02: 10 mL
  Filled 2021-01-02: qty 10

## 2021-01-02 MED ORDER — SODIUM CHLORIDE 0.9 % IV SOLN
Freq: Once | INTRAVENOUS | Status: DC
  Filled 2021-01-02: qty 250

## 2021-01-02 NOTE — Patient Instructions (Signed)
Rehydration, Adult Rehydration is the replacement of body fluids, salts, and minerals (electrolytes) that are lost during dehydration. Dehydration is when there is not enough water or other fluids in the body. This happens when you lose more fluids than you take in. Common causes of dehydration include:  Not drinking enough fluids. This can occur when you are ill or doing activities that require a lot of energy, especially in hot weather.  Conditions that cause loss of water or other fluids, such as diarrhea, vomiting, sweating, or urinating a lot.  Other illnesses, such as fever or infection.  Certain medicines, such as those that remove excess fluid from the body (diuretics). Symptoms of mild or moderate dehydration may include thirst, dry lips and mouth, and dizziness. Symptoms of severe dehydration may include increased heart rate, confusion, fainting, and not urinating. For severe dehydration, you may need to get fluids through an IV at the hospital. For mild or moderate dehydration, you can usually rehydrate at home by drinking certain fluids as told by your health care provider. What are the risks? Generally, rehydration is safe. However, taking in too much fluid (overhydration) can be a problem. This is rare. Overhydration can cause an electrolyte imbalance, kidney failure, or a decrease in salt (sodium) levels in the body. Supplies needed You will need an oral rehydration solution (ORS) if your health care provider tells you to use one. This is a drink to treat dehydration. It can be found in pharmacies and retail stores. How to rehydrate Fluids Follow instructions from your health care provider for rehydration. The kind of fluid and the amount you should drink depend on your condition. In general, you should choose drinks that you prefer.  If told by your health care provider, drink an ORS. ? Make an ORS by following instructions on the package. ? Start by drinking small amounts,  about  cup (120 mL) every 5-10 minutes. ? Slowly increase how much you drink until you have taken the amount recommended by your health care provider.  Drink enough clear fluids to keep your urine pale yellow. If you were told to drink an ORS, finish it first, then start slowly drinking other clear fluids. Drink fluids such as: ? Water. This includes sparkling water and flavored water. Drinking only water can lead to having too little sodium in your body (hyponatremia). Follow the advice of your health care provider. ? Water from ice chips you suck on. ? Fruit juice with water you add to it (diluted). ? Sports drinks. ? Hot or cold herbal teas. ? Broth-based soups. ? Milk or milk products. Food Follow instructions from your health care provider about what to eat while you rehydrate. Your health care provider may recommend that you slowly begin eating regular foods in small amounts.  Eat foods that contain a healthy balance of electrolytes, such as bananas, oranges, potatoes, tomatoes, and spinach.  Avoid foods that are greasy or contain a lot of sugar. In some cases, you may get nutrition through a feeding tube that is passed through your nose and into your stomach (nasogastric tube, or NG tube). This may be done if you have uncontrolled vomiting or diarrhea.   Beverages to avoid Certain beverages may make dehydration worse. While you rehydrate, avoid drinking alcohol.   How to tell if you are recovering from dehydration You may be recovering from dehydration if:  You are urinating more often than before you started rehydrating.  Your urine is pale yellow.  Your energy level   improves.  You vomit less frequently.  You have diarrhea less frequently.  Your appetite improves or returns to normal.  You feel less dizzy or less light-headed.  Your skin tone and color start to look more normal. Follow these instructions at home:  Take over-the-counter and prescription medicines only  as told by your health care provider.  Do not take sodium tablets. Doing this can lead to having too much sodium in your body (hypernatremia). Contact a health care provider if:  You continue to have symptoms of mild or moderate dehydration, such as: ? Thirst. ? Dry lips. ? Slightly dry mouth. ? Dizziness. ? Dark urine or less urine than normal. ? Muscle cramps.  You continue to vomit or have diarrhea. Get help right away if you:  Have symptoms of dehydration that get worse.  Have a fever.  Have a severe headache.  Have been vomiting and the following happens: ? Your vomiting gets worse or does not go away. ? Your vomit includes blood or green matter (bile). ? You cannot eat or drink without vomiting.  Have problems with urination or bowel movements, such as: ? Diarrhea that gets worse or does not go away. ? Blood in your stool (feces). This may cause stool to look black and tarry. ? Not urinating, or urinating only a small amount of very dark urine, within 6-8 hours.  Have trouble breathing.  Have symptoms that get worse with treatment. These symptoms may represent a serious problem that is an emergency. Do not wait to see if the symptoms will go away. Get medical help right away. Call your local emergency services (911 in the [place] Do not drive yourself to the hospital. Summary  Rehydration is the replacement of body fluids and minerals (electrolytes) that are lost during dehydration.  Follow instructions from your health care provider for rehydration. The kind of fluid and amount you should drink depend on your condition.  Slowly increase how much you drink until you have taken the amount recommended by your health care provider.  Contact your health care provider if you continue to show signs of mild or moderate dehydration. This information is not intended to replace advice given to you by your health care provider. Make sure you discuss any questions you have with  your health care provider. Document Revised: 12/01/2019 Document Reviewed: 10/11/2019 Elsevier Patient Education  2021 [employer]vier Inc.  

## 2021-01-03 ENCOUNTER — Telehealth: Payer: Self-pay | Admitting: *Deleted

## 2021-01-03 NOTE — Telephone Encounter (Signed)
CoverMyMeds Key: [id number] for Megace 625 mg/5 ml suspension approved effective 01/01/2021 through 01/01/2022.

## 2021-01-05 ENCOUNTER — Encounter: Payer: Self-pay | Admitting: Hematology

## 2021-01-05 ENCOUNTER — Other Ambulatory Visit: Payer: Self-pay

## 2021-01-05 DIAGNOSIS — R112 Nausea with vomiting, unspecified: Secondary | ICD-10-CM

## 2021-01-05 DIAGNOSIS — C3490 Malignant neoplasm of unspecified part of unspecified bronchus or lung: Secondary | ICD-10-CM

## 2021-01-05 DIAGNOSIS — T451X5A Adverse effect of antineoplastic and immunosuppressive drugs, initial encounter: Secondary | ICD-10-CM

## 2021-01-05 MED ORDER — ONDANSETRON 8 MG PO TBDP: 8.0000 mg | ORAL_TABLET | Freq: Three times a day (TID) | ORAL | 3 refills | Status: AC | PRN

## 2021-01-05 NOTE — Progress Notes (Signed)
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  SUMMARY OF ONCOLOGIC HISTORY: 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 [facility]) Staging form: Breast, AJCC 7th Edition - Clinical: Stage IIA (T2, N0, M0) - Signed by [name], MD on 02/08/2015 Laterality: Right Estrogen receptor status: Positive Progesterone receptor status: Positive HER2 status: Negative - Pathologic stage from 03/23/2015: Stage Unknown (T2, NX, cM0) - Unsigned  Small cell lung cancer ([place]) Staging form: Lung, AJCC 8th Edition - Clinical stage from 11/13/2020: Stage IV (cTX, cN3, cM1) - Signed by [name], MD on 11/16/2020 Stage prefix: Initial diagnosis     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) and adjuvant radiation ([name]).   12/13/2014 Mammogram   Right breast: possible asymmetry warranting further evaluation with spot compression views and possibly ultrasound   12/16/2014 Breast US   Right breast: no definitive abnormality within the superior aspect of the right breast to correspond with the mammographic finding.   01/10/2015 Breast MRI   Right breast: irregular  rim enhancing mass within the superior right breast at 11:30 measuring 2.5 x 2.4 x 2.4 cm. No abnormal enhancement is identified within the skin of the superior right breast in the area of concern on mammogram.   01/12/2015 Breast US   Second look diagnostic mammogram and ultrasound showed a 2.3 cm mass in the upper midline right breast. No axillary adenopathy.   01/12/2015 Initial Biopsy   Right breast needle core bx (upper midline): Invasive ductal carcinoma, grade 2, ER+ (100%), PR+ (77%), HER2/neu negative (ratio 1.15), Ki67 20%    01/30/2015 Procedure   Breast High/Moderate Risk panel ([facility]) reveals no clinically significant variant at ATM, BRCA1, BRCA2, CDH1, CHEK2, PALB2, PTEN, STK11, and TP53.   02/08/2015 Clinical Stage   Stage IIA (T2 N0)   03/23/2015 Definitive Surgery   Right mastectomy ([name]): invasive adenocarcinoma, grade 3, 2.9 cm, HER2/neu repeated, negative (ratio 1.23)   03/23/2015 Oncotype testing   Score: 8 (6% ROR). No chemotherapy [name]).   03/23/2015 Pathologic Stage   Stage IIA: pT2 pNx   05/04/2015 -  Anti-estrogen oral therapy   Letrozole 2.53m daily. Planned duration of therapy at least 5 years.    07/25/2015 Survivorship   Survivorship visit completed and copy of care plan provided to patient.   12/15/2015 Mammogram   IMPRESSION: No mammographic evidence of malignancy. A result letter of this screening mammogram will be mailed directly to the patient.   12/23/2016 Mammogram   IMPRESSION: No mammographic evidence of malignancy. A result letter of this screening mammogram will be mailed directly to the patient.    12/30/2017 Mammogram   12/30/2017 Mammogram IMPRESSION: No mammographic evidence of malignancy. A result letter of this screening mammogram will be mailed directly to the patient.   03/27/2020 Mammogram  IMPRESSION: No mammographic evidence of malignancy. A result letter of this screening mammogram will be mailed directly to the patient.    10/26/2020 Imaging   CT chest IMPRESSION: 1. Bulky mediastinal, thoracic inlet, right supraclavicular, and left axillary lymphadenopathy consistent with metastatic disease. Associated numerous bilateral pulmonary nodules and masses, most prominently in the right middle lobe with dominant 4.1 cm peripheral mass lesion. 2. 16 mm rim enhancing lesion posterior right liver consistent with metastatic involvement. 3. 13 mm hypoenhancing lesion in the tail of pancreas. Primary neoplasm or metastatic involvement could have this appearance. 4. Multiple nodules in the upper abdomen concerning for peritoneal/mesenteric metastatic disease. 5. Small right and tiny left pleural effusions with bibasilar atelectasis. 6. Aortic Atherosclerosis (ICD10-I70.0).   11/10/2020 PET scan   IMPRESSION: 1. Extensive hypermetabolic multi organ metastatic disease. 2. Bulky intensely hypermetabolic RIGHT supraclavicular, mediastinal and LEFT axillary adenopathy. 3. Hypermetabolic pulmonary mass in the RIGHT upper lobe with additional hypermetabolic nodules. 4. Hypermetabolic solitary hepatic metastasis. 5. Hypermetabolic lesion within the pancreas is also favored metastatic lesion. 6. Multifocal hypermetabolic skeletal metastasis. 7. Bilateral pleural effusions.   11/13/2020 Procedure   Thoracentesis  IMPRESSION: Successful ultrasound guided right thoracentesis yielding 200 mL of pleural fluid.   Small cell lung cancer ([place])  11/13/2020 Pathology Results   Right supraclavicular LN Biopsy  FINAL MICROSCOPIC DIAGNOSIS:   A. LYMPH NODE, RIGHT SUPRACLAVICULAR, NEEDLE CORE BIOPSY:  - Most consistent with small cell carcinoma.   COMMENT:   Tumor is limited in quantity, and partially necrotic.  TTF-1 and  Synaptophysin are positive.  Ki-67 proliferation index reaches 80-90%.  GATA-3, ER and p40 are negative.  The morphologic and immunophenotypic  characteristics are most suggestive of small cell  carcinoma.  TTF-1  positive staining is compatible with origin from the lung; however, it  does not exclude origin from other entities.  Please note that distinct  nodal tissue is not identified in the submitted material.  Results  reported to Dr. [name] on 11/16/2020.  Dr. Melina Copa reviewed the case   11/13/2020 Cancer Staging   Staging form: Lung, AJCC 8th Edition - Clinical stage from 11/13/2020: Stage IV (cTX, cN3, cM1) - Signed by [name], MD on 11/16/2020 Stage prefix: Initial diagnosis   11/13/2020 - 11/28/2020 Radiation Therapy   palliative radiation to [facility] with Dr Lisbeth Renshaw 11/13/20-11/28/20   11/16/2020 Initial Diagnosis   Small cell lung cancer ([place])   11/22/2020 -  Chemotherapy   First line etoposide D1-3, carboplatin and atezolizumab D1 q3weeks beginning 11/22/20.        11/29/2020 Procedure   PAC placed on 11/29/20   01/01/2021 Imaging   IMPRESSION: 1. Significant interval decrease in size of the right middle lobe lung mass and significant interval decrease in size of the mediastinal and right hilar lymph nodes. 2. Resolution of right supraclavicular and left axillary adenopathy. 3. Stable miliary pattern of pulmonary nodules. No new or progressive findings. 4. Stable diffuse sclerotic metastatic bone disease. No findings for progression. 5. Interval decrease in size of the pancreatic mass and adjacent lymph node. 6. Slightly smaller segment 7 liver lesion. No new or progressive findings. 7. Emphysema and aortic atherosclerosis.   Aortic Atherosclerosis (ICD10-I70.0) and Emphysema (ICD10-J43.9).        CURRENT THERAPY:  First lineetoposideD1-3,carboplatin and atezolizumabD1 q3weeks beginning 11/22/20.  INTERVAL HISTORY:  KEYONDRA LAGRAND is here for a follow up. She presents to the clinic with her sister. She notes she notes in some ways she has improved but  notes she is still on soft foods due to dysphagia. She notes it feels like some blockage of her throat.  She notes thickness of ensure is hard to get down. She notes her appetite is still low due to ability yo swallow and her nausea. I reviewed her medication list with her. She notes she stopped Marinol. She notes she has not been on letrozole consistently. She notes she is having BM once a week which she attributes to low food intake. She notes when she had BM is it normal stool.    REVIEW OF SYSTEMS:   Constitutional: Denies fevers, chills or abnormal weight loss Eyes: Denies blurriness of vision Ears, nose, mouth, throat, and face: Denies mucositis or sore throat (+) Dysphagia  Respiratory: Denies cough, dyspnea or wheezes Cardiovascular: Denies palpitation, chest discomfort or lower extremity swelling Gastrointestinal:  (+) Nausea (+) Low BM  Skin: Denies abnormal skin rashes Lymphatics: Denies new lymphadenopathy or easy bruising Neurological:Denies numbness, tingling or new weaknesses Behavioral/Psych: Mood is stable, no new changes  All other systems were reviewed with the patient and are negative.  MEDICAL HISTORY:  Past Medical History:  Diagnosis Date  . Anxiety   . Asthma    does not take any medications, hx of inhaler use  . Breast cancer [facility]) 1995/2016   right sided breast cancer in 08/1994  . Bronchitis    hx of  . GERD (gastroesophageal reflux disease)   . History of hiatal hernia   . Hypertension   . Hyperthyroidism   . Lung cancer ([place]) 11/02/2020    SURGICAL HISTORY: Past Surgical History:  Procedure Laterality Date  . BREAST BIOPSY Right 2016   malignant  . BREAST EXCISIONAL BIOPSY Left 1996   benign  . Carpel Tunnel  Bilateral   . INCONTINENCE SURGERY     with mesh insertion about 7 years ago  . IR IMAGING GUIDED PORT INSERTION  11/29/2020  . LATISSIMUS FLAP TO BREAST Right 03/23/2015   Procedure: LATISSIMUS FLAP TO BREAST WITH PLACEMENT OF IMPLANT FOR BREAST RECONSTRUCTION;  Surgeon: [name], MD;  Location: [facility];  Service: Plastics;  Laterality:  Right;  . MASTECTOMY Right 03/23/2015  . RECONSTRUCTION BREAST W/ LATISSIMUS DORSI FLAP Right 03/23/2015  . TONSILLECTOMY     as a child  . TOTAL KNEE ARTHROPLASTY Right 03/13/2018   Procedure: RIGHT TOTAL KNEE ARTHROPLASTY;  Surgeon: Sydnee Cabal, MD;  Location: [facility];  Service: Orthopedics;  Laterality: Right;  Adductor Block  . TOTAL MASTECTOMY Right 03/23/2015   Procedure: RIGHT TOTAL MASTECTOMY;  Surgeon: [name], MD;  Location: [place];  Service: General;  Laterality: Right;    I have reviewed the social history and family history with the patient and they are unchanged from previous note.  ALLERGIES:  is allergic to fosamax [alendronate sodium].  MEDICATIONS:  Current Outpatient Medications  Medication Sig Dispense Refill  . allopurinol (ZYLOPRIM) 300 MG tablet Take 1 tablet (300 mg total) by mouth daily. 30 tablet 1  . ALPRAZolam (XANAX) 0.5 MG tablet Take 1 tablet by mouth twice daily as needed 180 tablet 0  . cyclobenzaprine (FLEXERIL) 10 MG tablet Take 1 tablet (10 mg total) by mouth 3 (three) times daily as needed for muscle spasms. 30 tablet 2  . esomeprazole (NEXIUM) 40 MG capsule One po daily at bedtime (Patient taking differently: Take 40 mg by mouth daily.) 90 capsule 1  . fentaNYL (DURAGESIC) 12 MCG/HR Place 1 patch onto the skin every 3 (three) days. 10 patch  0  . HYDROcodone-acetaminophen (HYCET) 7.5-325 mg/15 ml solution Take 10-15 mLs by mouth every 4 (four) hours as needed for moderate pain or severe pain. 473 mL 0  . letrozole (FEMARA) 2.5 MG tablet Take 1 tablet (2.5 mg total) by mouth daily. 90 tablet 3  . levothyroxine (SYNTHROID) 125 MCG tablet Take 1 tablet (125 mcg total) by mouth daily. 90 tablet 0  . lidocaine (LIDODERM) 5 % Place 1 patch onto the skin daily. Remove & Discard patch within 12 hours or as directed by MD 30 patch 0  . lidocaine-prilocaine (EMLA) cream Apply 1 application topically as needed. 30 g 3  . megestrol (MEGACE ES) 625 MG/5ML  suspension Take 5 mLs (625 mg total) by mouth daily. 150 mL 0  . meloxicam (MOBIC) 7.5 MG tablet Take 7.5 mg by mouth 2 (two) times daily as needed for pain.    . metoprolol succinate (TOPROL-XL) 50 MG 24 hr tablet Take 1 tablet by mouth twice daily (Patient taking differently: Take 50 mg by mouth 2 (two) times daily.) 60 tablet 1  . ondansetron (ZOFRAN) 8 MG tablet Take 1 tablet (8 mg total) by mouth 2 (two) times daily as needed for refractory nausea / vomiting. Start on day 3 after carboplatin chemo. 30 tablet 1  . ondansetron (ZOFRAN-ODT) 8 MG disintegrating tablet Take 1 tablet (8 mg total) by mouth every 8 (eight) hours as needed for nausea or vomiting. 30 tablet 3  . potassium chloride (MICRO-K) 10 MEQ CR capsule Take 1 capsule (10 mEq total) by mouth 2 (two) times daily. 30 capsule 2  . potassium chloride SA (KLOR-CON) 20 MEQ tablet Take 1 tablet (20 mEq total) by mouth 2 (two) times daily. 30 tablet 0  . prochlorperazine (COMPAZINE) 10 MG tablet Take 1 tablet (10 mg total) by mouth every 6 (six) hours as needed (Nausea or vomiting). 30 tablet 1  . senna-docusate (SENOKOT-S) 8.6-50 MG tablet Take 1 tablet by mouth daily. 30 tablet 0  . verapamil (CALAN-SR) 240 MG CR tablet Take 1 tablet by mouth once daily (Patient taking differently: Take 240 mg by mouth daily.) 90 tablet 0   No current facility-administered medications for this visit.   Facility-Administered Medications Ordered in Other Visits  Medication Dose Route Frequency Provider Last Rate Last Admin  . 0.9 %  sodium chloride infusion   Intravenous Continuous [name], MD      . [facility]) 1,200 mg in sodium chloride 0.9 % 250 mL chemo infusion  1,200 mg Intravenous Once [name], MD      . CARBOplatin (PARAPLATIN) 290 mg in sodium chloride 0.9 % 250 mL chemo infusion  290 mg Intravenous Once [name], MD      . dexamethasone (DECADRON) 10 mg in sodium chloride 0.9 % 50 mL IVPB  10 mg Intravenous Once [name], MD       . etoposide (VEPESID) 140 mg in sodium chloride 0.9 % 500 mL chemo infusion  90 mg/m2 (Treatment Plan Recorded) Intravenous Once [name], MD      . fosaprepitant (EMEND) 150 mg in sodium chloride 0.9 % 145 mL IVPB  150 mg Intravenous Once [name], MD      . heparin lock flush 100 unit/mL  500 Units Intracatheter Once PRN [name], MD      . sodium chloride flush (NS) 0.9 % injection 10 mL  10 mL Intracatheter PRN [name], MD        PHYSICAL EXAMINATION: ECOG PERFORMANCE  STATUS: 3 - Symptomatic, >50% confined to bed  Vitals:   01/08/21 0814  BP: (!) 120/59  Pulse: (!) 126  Resp: 18  Temp: (!) 97.3 F (36.3 C)  SpO2: 97%   Filed Weights   01/08/21 0814  Weight: 108 lb 8 oz (49.2 kg)    Due to COVID19 we will limit examination to appearance. Patient had no complaints.  GENERAL:alert, no distress and comfortable SKIN: skin color normal, no rashes or significant lesions EYES: normal, Conjunctiva are pink and non-injected, sclera clear  NEURO: alert & oriented x 3 with fluent speech   LABORATORY DATA:  I have reviewed the data as listed CBC Latest Ref Rng & Units 01/08/2021 01/01/2021 12/26/2020  WBC 4.0 - 10.5 K/uL 10.1 7.3 0.3(LL)  Hemoglobin 12.0 - 15.0 g/dL 7.9(L) 8.4(L) 9.5(L)  Hematocrit 36.0 - 46.0 % 23.5(L) 24.6(L) 29.4(L)  Platelets 150 - 400 K/uL 304 47(L) 100(L)     CMP Latest Ref Rng & Units 01/08/2021 01/01/2021 12/26/2020  Glucose 70 - 99 mg/dL 100(H) 97 99  BUN 8 - 23 mg/dL _0 Creatinine 0.44 - 1.00 mg/dL 0.95 0.84 0.82  Sodium 135 - 145 mmol/L 138 130(L) 133(L)  Potassium 3.5 - 5.1 mmol/L 3.2(L) 3.6 4.1  Chloride 98 - 111 mmol/L 103 103 106  CO2 22 - 32 mmol/L 20(L) 16(L) 16(L)  Calcium 8.9 - 10.3 mg/dL 8.7(L) 9.1 8.9  Total Protein 6.5 - 8.1 g/dL 6.1(L) 6.2(L) 6.6  Total Bilirubin 0.3 - 1.2 mg/dL 0.8 0.6 1.5(H)  Alkaline Phos 38 - 126 U/L 124 171(H) 140(H)  AST 15 - 41 U/L 71(H) 35 60(H)  ALT 0 - 44 U/L 178(H) 109(H) 202(H)       RADIOGRAPHIC STUDIES: I have personally reviewed the radiological images as listed and agreed with the findings in the report. No results found.   ASSESSMENT & PLAN:  AKYLA VAVREK is a 75 y.o. female with    1.Right small cell lung cancer with diffuse metastasis to lung, thoracic LNs, liver,pancrease, andperitoneal/mesenteric metastasis -ShehadCOVID in 10/2019 and has had some breathing changes sincewith progressive SOB and mild cough. She also has increasing right chest/flank pain.  -HerCT chest from 1/13/22showed numerous bilateral pulmonary nodules and masses, most prominently in the right middle lobe with dominant 4.1 cmmass withBulky mediastinal, thoracic inlet, right supraclavicular, and left axillary lymphadenopathy consistent with metastatic disease. Scan also shows16 mm lesioninright liveralong withMultiple nodules in the upper abdomen concerningfor metastatic disease. There is also a13 mm hypoenhancing lesion in the tail of pancreas. -Her PET from 1/28/22confirmed diffuse metastatic disease. Her 11/13/20 biopsy ofright supraclavicular LN showedsmall cell lung cancer, TTF-1(+). Her cancer is stage IV and no longer curable but still treatable.She does have right skull metastasis, not present in brain on 12/01/20 MRI. -I discussed the overall poor prognosis,given the aggressive nature of small cell lung cancer, and high disease burden.Shewas on [place] oxygen around the time she was diagnosed, off oxygen now  -She received palliative radiation to mediastinum and right supraclavicular,due to significant disease burden and compression to her airway. -I started her on first-line chemo withetoposideD1-3,carboplatin and atezolizumabD1 q3weeks beginning2/14/22.After 4 cycles will switch her to maintenance immunotherapy Tecentriq q3weeks.  -We discussed her CT CAP from 01/01/21 which showed Significant interval decrease in size of the right middle lobe lung mass  and significant interval decrease in size of the mediastinal and right hilar lymph nodes, Interval decrease in size of the pancreatic mass and adjacent lymph node.and resolution of  right supraclavicular and left axillary adenopathy. Otherwise stable disease. I personally reviewed the images and discussed with patient today. She overall is responding well to treatment, will continue.  -She is managing fairly well on chemo. I reviewed diet, eating and symptom management. Labs reviewed, Hg 7.9, K, 3.2, BG 100, CA 8.7, protein 6.1, Alk Phos 178. Overall adequate to proceed with C3D1 with etopside dose reduction.  -Will give blood transfusion this week give Hg 7.9. She is agreeable.  -F/u in 3 weeks.    2. Symptoms Management: Anorexia and weight loss, Dysphagia -Her dysphagia and odynophagia after radiation has much improved -For pain management, I started her on long acting Fentanyl patch which has helped a lot, she is not using liquid hydrocodone much. -She is able to get food down better but still has some dysphagia. I will refer her to GI for endoscopy as she may need stretching for esophageal stricture s/p radiation.  -She has tried Megace and Marinol. She only continues Megace. She has most recently tried Marinol. She notes Ensure is too thick for her and nausea has not been controlled on antiemetics. She can crush the pill.  -Given low food intake and pain medication she has had BM about once weekly. I recommend Miralax to have more frequent BM.  -I recommend she f/u with dietician.  -For low Potassium, I recommend she crush pill so she can take it.    3. Bone Mets -Her2/18/22MRI brainwhichshowed nobrain mets, butdoes have bone metastasisin herrightskullbase. She has no head paincurrently. If pain presents she may have palliative RT.   4.Goal of care discussion, DNR/DNI -We previously discussed the incurable nature of her cancer, and the overall poor prognosis, especially  if she does not have good response to chemotherapy or progress on chemo -The patient understands the goal of care is palliative.   5. Recurrent right breast invasive ductal carcinoma, pT2N0, stage II, grade 3, ER positive, PR positive, HER-2 negative.Osteopenia  -Initiallydiagnosed in 1996, treated with right lumpectomy, 6 monthsof chemoCMF and adjuvant radiation.  -Recurred in 12/2014. She iss/prightmastectomy with completed resection with low risk Oncotype  -She started anti-estrogen therapy withletrozolein 04/2015. Has been on intermittently since lung cancer diagnosis.  -Her 03/27/20 DEXA showed Osteopenia with -2.3 at right hip.She had at least 7 years of Fosamax and Prolia.    PLAN -CT scan reviewed, excellent response  -Labs reviewed and adequate to proceed with C3D1 carboplatin, etoposide, Tecentriq with etopside dose reduction 10% due to her poor PS. Continue Etopside for Day 2-3.  -1U Blood transfusion this week.  -Lab, flush, f/u and 2Hr IV fluids next week with NP Lacie  -lab, flush, f/u and cycle 4 chemo carboplatin, etoposide, Tecentriq in 3 weeks  -Send GI referral for EGD evaluation of dysphagia  -F/u with Dietician for weight loss, I sent a message to [facility]    No problem-specific Assessment & Plan notes found for this encounter.   Orders Placed This Encounter  Procedures  . Ambulatory referral to Gastroenterology    Referral Priority:   Elective    Referral Type:   Consultation    Referral Reason:   Specialty Services Required    Number of Visits Requested:   1  . Type and screen    Standing Status:   Future    Number of Occurrences:   1    Standing Expiration Date:   01/08/2022  . Sample to Blood Bank(Blood Bank Hold)    Standing Status:   Future  Number of Occurrences:   1    Standing Expiration Date:   01/08/2022   All questions were answered. The patient knows to call the clinic with any problems, questions or concerns. No barriers to learning was  detected. The total time spent in the appointment was 40 minutes.     [name], MD 01/08/2021   I, Joslyn Devon, am acting as scribe for [name], MD.   I have reviewed the above documentation for accuracy and completeness, and I agree with the above.

## 2021-01-08 ENCOUNTER — Ambulatory Visit: Payer: Medicare Other | Admitting: Nutrition

## 2021-01-08 ENCOUNTER — Encounter: Payer: Self-pay | Admitting: Hematology

## 2021-01-08 ENCOUNTER — Other Ambulatory Visit: Payer: Self-pay | Admitting: *Deleted

## 2021-01-08 ENCOUNTER — Other Ambulatory Visit: Payer: Self-pay

## 2021-01-08 ENCOUNTER — Inpatient Hospital Stay (HOSPITAL_BASED_OUTPATIENT_CLINIC_OR_DEPARTMENT_OTHER): Payer: Medicare Other | Admitting: Hematology

## 2021-01-08 ENCOUNTER — Inpatient Hospital Stay: Payer: Medicare Other

## 2021-01-08 VITALS — BP 120/59 | HR 126 | Temp 97.3°F | Resp 18 | Wt 108.5 lb

## 2021-01-08 VITALS — HR 121

## 2021-01-08 DIAGNOSIS — Z853 Personal history of malignant neoplasm of breast: Secondary | ICD-10-CM | POA: Diagnosis not present

## 2021-01-08 DIAGNOSIS — M8580 Other specified disorders of bone density and structure, unspecified site: Secondary | ICD-10-CM | POA: Diagnosis not present

## 2021-01-08 DIAGNOSIS — D649 Anemia, unspecified: Secondary | ICD-10-CM | POA: Diagnosis not present

## 2021-01-08 DIAGNOSIS — Z923 Personal history of irradiation: Secondary | ICD-10-CM | POA: Diagnosis not present

## 2021-01-08 DIAGNOSIS — C3490 Malignant neoplasm of unspecified part of unspecified bronchus or lung: Secondary | ICD-10-CM | POA: Diagnosis not present

## 2021-01-08 DIAGNOSIS — Z7189 Other specified counseling: Secondary | ICD-10-CM

## 2021-01-08 DIAGNOSIS — Z9011 Acquired absence of right breast and nipple: Secondary | ICD-10-CM | POA: Diagnosis not present

## 2021-01-08 DIAGNOSIS — C50411 Malignant neoplasm of upper-outer quadrant of right female breast: Secondary | ICD-10-CM | POA: Diagnosis not present

## 2021-01-08 DIAGNOSIS — Z170 Estrogen receptor positive status [ER+]: Secondary | ICD-10-CM | POA: Diagnosis not present

## 2021-01-08 DIAGNOSIS — C7951 Secondary malignant neoplasm of bone: Secondary | ICD-10-CM | POA: Diagnosis not present

## 2021-01-08 DIAGNOSIS — M810 Age-related osteoporosis without current pathological fracture: Secondary | ICD-10-CM | POA: Diagnosis not present

## 2021-01-08 DIAGNOSIS — Z79811 Long term (current) use of aromatase inhibitors: Secondary | ICD-10-CM | POA: Diagnosis not present

## 2021-01-08 DIAGNOSIS — C50911 Malignant neoplasm of unspecified site of right female breast: Secondary | ICD-10-CM

## 2021-01-08 DIAGNOSIS — Z5111 Encounter for antineoplastic chemotherapy: Secondary | ICD-10-CM | POA: Diagnosis not present

## 2021-01-08 DIAGNOSIS — R590 Localized enlarged lymph nodes: Secondary | ICD-10-CM

## 2021-01-08 DIAGNOSIS — I10 Essential (primary) hypertension: Secondary | ICD-10-CM | POA: Diagnosis not present

## 2021-01-08 DIAGNOSIS — M1990 Unspecified osteoarthritis, unspecified site: Secondary | ICD-10-CM | POA: Diagnosis not present

## 2021-01-08 DIAGNOSIS — C7800 Secondary malignant neoplasm of unspecified lung: Secondary | ICD-10-CM | POA: Diagnosis not present

## 2021-01-08 LAB — CBC WITH DIFFERENTIAL (CANCER CENTER ONLY)
Abs Immature Granulocytes: 2.17 10*3/uL — ABNORMAL HIGH (ref 0.00–0.07)
Basophils Absolute: 0.1 10*3/uL (ref 0.0–0.1)
Basophils Relative: 1 %
Eosinophils Absolute: 0 10*3/uL (ref 0.0–0.5)
Eosinophils Relative: 0 %
HCT: 23.5 % — ABNORMAL LOW (ref 36.0–46.0)
Hemoglobin: 7.9 g/dL — ABNORMAL LOW (ref 12.0–15.0)
Immature Granulocytes: 22 %
Lymphocytes Relative: 4 %
Lymphs Abs: 0.4 10*3/uL — ABNORMAL LOW (ref 0.7–4.0)
MCH: 27.5 pg (ref 26.0–34.0)
MCHC: 33.6 g/dL (ref 30.0–36.0)
MCV: 81.9 fL (ref 80.0–100.0)
Monocytes Absolute: 1.3 10*3/uL — ABNORMAL HIGH (ref 0.1–1.0)
Monocytes Relative: 13 %
Neutro Abs: 6.2 10*3/uL (ref 1.7–7.7)
Neutrophils Relative %: 60 %
Platelet Count: 304 10*3/uL (ref 150–400)
RBC: 2.87 MIL/uL — ABNORMAL LOW (ref 3.87–5.11)
RDW: 17.4 % — ABNORMAL HIGH (ref 11.5–15.5)
WBC Count: 10.1 10*3/uL (ref 4.0–10.5)
nRBC: 1.1 % — ABNORMAL HIGH (ref 0.0–0.2)

## 2021-01-08 LAB — CMP (CANCER CENTER ONLY)
ALT: 178 U/L — ABNORMAL HIGH (ref 0–44)
AST: 71 U/L — ABNORMAL HIGH (ref 15–41)
Albumin: 3.5 g/dL (ref 3.5–5.0)
Alkaline Phosphatase: 124 U/L (ref 38–126)
Anion gap: 15 (ref 5–15)
BUN: 15 mg/dL (ref 8–23)
CO2: 20 mmol/L — ABNORMAL LOW (ref 22–32)
Calcium: 8.7 mg/dL — ABNORMAL LOW (ref 8.9–10.3)
Chloride: 103 mmol/L (ref 98–111)
Creatinine: 0.95 mg/dL (ref 0.44–1.00)
GFR, Estimated: >60 mL/min (ref >60)
Glucose, Bld: 100 mg/dL — ABNORMAL HIGH (ref 70–99)
Potassium: 3.2 mmol/L — ABNORMAL LOW (ref 3.5–5.1)
Sodium: 138 mmol/L (ref 135–145)
Total Bilirubin: 0.8 mg/dL (ref 0.3–1.2)
Total Protein: 6.1 g/dL — ABNORMAL LOW (ref 6.5–8.1)

## 2021-01-08 LAB — SAMPLE TO BLOOD BANK

## 2021-01-08 LAB — T4, FREE: Free T4: 2.64 ng/dL — ABNORMAL HIGH (ref 0.61–1.12)

## 2021-01-08 LAB — MAGNESIUM: Magnesium: 1.6 mg/dL — ABNORMAL LOW (ref 1.7–2.4)

## 2021-01-08 MED ORDER — SODIUM CHLORIDE 0.9% FLUSH
10.0000 mL | INTRAVENOUS | Status: DC | PRN
  Administered 2021-01-08: 10 mL
  Filled 2021-01-08: qty 10

## 2021-01-08 MED ORDER — PALONOSETRON HCL INJECTION 0.25 MG/5ML
0.2500 mg | Freq: Once | INTRAVENOUS | Status: AC
  Administered 2021-01-08: 0.25 mg via INTRAVENOUS

## 2021-01-08 MED ORDER — HEPARIN SOD (PORK) LOCK FLUSH 100 UNIT/ML IV SOLN
500.0000 [IU] | Freq: Once | INTRAVENOUS | Status: AC | PRN
  Administered 2021-01-08: 500 [IU]
  Filled 2021-01-08: qty 5

## 2021-01-08 MED ORDER — PALONOSETRON HCL INJECTION 0.25 MG/5ML
INTRAVENOUS | Status: AC
  Filled 2021-01-08: qty 5

## 2021-01-08 MED ORDER — SODIUM CHLORIDE 0.9 % IV SOLN: 290.0000 mg | Freq: Once | INTRAVENOUS | Status: DC

## 2021-01-08 MED ORDER — SODIUM CHLORIDE 0.9 % IV SOLN
INTRAVENOUS | Status: AC
  Filled 2021-01-08: qty 250

## 2021-01-08 MED ORDER — SODIUM CHLORIDE 0.9 % IV SOLN
10.0000 mg | Freq: Once | INTRAVENOUS | Status: AC
  Administered 2021-01-08: 10 mg via INTRAVENOUS
  Filled 2021-01-08: qty 1

## 2021-01-08 MED ORDER — SODIUM CHLORIDE 0.9 % IV SOLN
150.0000 mg | Freq: Once | INTRAVENOUS | Status: AC
  Administered 2021-01-08: 150 mg via INTRAVENOUS
  Filled 2021-01-08: qty 5

## 2021-01-08 MED ORDER — SODIUM CHLORIDE 0.9 % IV SOLN: 90.0000 mg/m2 | Freq: Once | INTRAVENOUS | Status: DC

## 2021-01-08 MED ORDER — ATEZOLIZUMAB CHEMO INJECTION 1200 MG/20ML
1200.0000 mg | Freq: Once | INTRAVENOUS | Status: AC
  Administered 2021-01-08: 1200 mg via INTRAVENOUS
  Filled 2021-01-08: qty 20

## 2021-01-08 MED ORDER — SODIUM CHLORIDE 0.9 % IV SOLN
255.6000 mg | Freq: Once | INTRAVENOUS | Status: AC
  Administered 2021-01-08: 260 mg via INTRAVENOUS
  Filled 2021-01-08: qty 26

## 2021-01-08 MED ORDER — SODIUM CHLORIDE 0.9 % IV SOLN
90.0000 mg/m2 | Freq: Once | INTRAVENOUS | Status: AC
  Administered 2021-01-08: 130 mg via INTRAVENOUS
  Filled 2021-01-08: qty 6.5

## 2021-01-08 NOTE — Progress Notes (Signed)
Per Dr. [name] ok to treat with Hgb 7.9, elevated HR and ALT 7.8.

## 2021-01-08 NOTE — Progress Notes (Signed)
Per MD [name], ok to treat with hemoglobin today. Pt will receive 1 unit PRBCs later this week

## 2021-01-08 NOTE — Patient Instructions (Signed)
[place] Discharge Instructions for Patients Receiving Chemotherapy  Today you received the following chemotherapy agents atexolizumab, carboplatin, etoposide.  To help prevent nausea and vomiting after your treatment, we encourage you to take your nausea medication as directed.   If you develop nausea and vomiting that is not controlled by your nausea medication, call the clinic.   BELOW ARE SYMPTOMS THAT SHOULD BE REPORTED IMMEDIATELY:  *FEVER GREATER THAN 100.5 F  *CHILLS WITH OR WITHOUT FEVER  NAUSEA AND VOMITING THAT IS NOT CONTROLLED WITH YOUR NAUSEA MEDICATION  *UNUSUAL SHORTNESS OF BREATH  *UNUSUAL BRUISING OR BLEEDING  TENDERNESS IN MOUTH AND THROAT WITH OR WITHOUT PRESENCE OF ULCERS  *URINARY PROBLEMS  *BOWEL PROBLEMS  UNUSUAL RASH Items with * indicate a potential emergency and should be followed up as soon as possible.  Feel free to call the clinic should you have any questions or concerns. The clinic phone number is [phone].  Please show the [place] at check-in to the Emergency Department and triage nurse.

## 2021-01-08 NOTE — Progress Notes (Signed)
Nutrition follow-up completed with patient at request of nursing and MD.  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   Weight decreased significantly and documented as 108.5 pounds March 28 down from 119.38 pounds March 2. Noted labs: Sodium 138, potassium 3.2, glucose 100, BUN 15, creatinine 0.95. Patient reports she sips on pineapple juice.  Reports she tried applesauce and vomited. Describes difficulty swallowing and is consuming nothing but liquids. Reports she can only take small sips and must wait for it to move down into her stomach.  States she drinks mostly juices.  She has not pushed herself to try higher calorie, higher protein supplements as we discussed at last visit.   Patient reports when the GI doctor stretches her esophagus she will be able to swallow and the strategies will not be needed..  Nutrition diagnosis: Unintended weight loss continues.  Intervention: Suggested patient try ice cream, milk, and cream soups for additional calories and protein. Reminded her to thin high-calorie high-protein oral nutrition supplements such as Ensure complete with milk or water. Educated patient she would need 5 cartons of Ensure Plus or equivalent to provide adequate calories and protein. Introduced feeding tube placement to supplement oral intake if she cannot see GI MD soon.  Patient did not want to discuss further. She is adamant and refuses feeding tube placement. I provided multiple oral nutrition supplements including juice-based nutrition drinks such as resource breeze.  Explained importance of increasing these drinks over plain juices to provide additional nutrients. Patient states understanding.  Monitoring, evaluation, goals: Patient will work to increase calories and protein to minimize further weight loss.  Next visit: Will schedule with upcoming appointment.  **Disclaimer: This note was dictated with voice recognition software. Similar sounding words can  inadvertently be transcribed and this note may contain transcription errors which may not have been corrected upon publication of note.**

## 2021-01-08 NOTE — Progress Notes (Signed)
Spoke with Dr. [name] and she would like to dose chemo based on new weight.  Larene Beach, PharmD

## 2021-01-09 ENCOUNTER — Inpatient Hospital Stay: Payer: Medicare Other

## 2021-01-09 ENCOUNTER — Encounter: Payer: Self-pay | Admitting: Hematology

## 2021-01-09 ENCOUNTER — Other Ambulatory Visit: Payer: Self-pay | Admitting: Hematology

## 2021-01-09 ENCOUNTER — Ambulatory Visit: Payer: Medicare Other

## 2021-01-09 VITALS — BP 124/71 | HR 96 | Temp 98.6°F | Resp 17

## 2021-01-09 DIAGNOSIS — Z923 Personal history of irradiation: Secondary | ICD-10-CM | POA: Diagnosis not present

## 2021-01-09 DIAGNOSIS — Z79811 Long term (current) use of aromatase inhibitors: Secondary | ICD-10-CM | POA: Diagnosis not present

## 2021-01-09 DIAGNOSIS — Z170 Estrogen receptor positive status [ER+]: Secondary | ICD-10-CM | POA: Diagnosis not present

## 2021-01-09 DIAGNOSIS — C50411 Malignant neoplasm of upper-outer quadrant of right female breast: Secondary | ICD-10-CM | POA: Diagnosis not present

## 2021-01-09 DIAGNOSIS — Z853 Personal history of malignant neoplasm of breast: Secondary | ICD-10-CM | POA: Diagnosis not present

## 2021-01-09 DIAGNOSIS — M8580 Other specified disorders of bone density and structure, unspecified site: Secondary | ICD-10-CM | POA: Diagnosis not present

## 2021-01-09 DIAGNOSIS — Z95828 Presence of other vascular implants and grafts: Secondary | ICD-10-CM

## 2021-01-09 DIAGNOSIS — C7951 Secondary malignant neoplasm of bone: Secondary | ICD-10-CM | POA: Diagnosis not present

## 2021-01-09 DIAGNOSIS — C3490 Malignant neoplasm of unspecified part of unspecified bronchus or lung: Secondary | ICD-10-CM

## 2021-01-09 DIAGNOSIS — I10 Essential (primary) hypertension: Secondary | ICD-10-CM | POA: Diagnosis not present

## 2021-01-09 DIAGNOSIS — M1990 Unspecified osteoarthritis, unspecified site: Secondary | ICD-10-CM | POA: Diagnosis not present

## 2021-01-09 DIAGNOSIS — Z7189 Other specified counseling: Secondary | ICD-10-CM

## 2021-01-09 DIAGNOSIS — D649 Anemia, unspecified: Secondary | ICD-10-CM

## 2021-01-09 DIAGNOSIS — Z5111 Encounter for antineoplastic chemotherapy: Secondary | ICD-10-CM | POA: Diagnosis not present

## 2021-01-09 DIAGNOSIS — C7800 Secondary malignant neoplasm of unspecified lung: Secondary | ICD-10-CM | POA: Diagnosis not present

## 2021-01-09 DIAGNOSIS — Z9011 Acquired absence of right breast and nipple: Secondary | ICD-10-CM | POA: Diagnosis not present

## 2021-01-09 MED ORDER — SODIUM CHLORIDE 0.9 % IV SOLN
10.0000 mg | Freq: Once | INTRAVENOUS | Status: AC
  Administered 2021-01-09: 10 mg via INTRAVENOUS
  Filled 2021-01-09: qty 10

## 2021-01-09 MED ORDER — DIPHENHYDRAMINE HCL 25 MG PO CAPS
ORAL_CAPSULE | ORAL | Status: AC
  Filled 2021-01-09: qty 1

## 2021-01-09 MED ORDER — SODIUM CHLORIDE 0.9% IV SOLUTION
250.0000 mL | Freq: Once | INTRAVENOUS | Status: AC
  Filled 2021-01-09: qty 250

## 2021-01-09 MED ORDER — HEPARIN SOD (PORK) LOCK FLUSH 100 UNIT/ML IV SOLN
500.0000 [IU] | Freq: Once | INTRAVENOUS | Status: AC
  Administered 2021-01-09: 500 [IU]
  Filled 2021-01-09: qty 5

## 2021-01-09 MED ORDER — SODIUM CHLORIDE 0.9% FLUSH
10.0000 mL | Freq: Once | INTRAVENOUS | Status: AC
Start: 2021-01-09 — End: 2021-01-09
  Administered 2021-01-09: 10 mL
  Filled 2021-01-09: qty 10

## 2021-01-09 MED ORDER — SODIUM CHLORIDE 0.9 % IV SOLN
90.0000 mg/m2 | Freq: Once | INTRAVENOUS | Status: AC
  Administered 2021-01-09: 130 mg via INTRAVENOUS
  Filled 2021-01-09: qty 6.5

## 2021-01-09 MED ORDER — SODIUM CHLORIDE 0.9 % IV SOLN
INTRAVENOUS | Status: AC
  Filled 2021-01-09 (×2): qty 250

## 2021-01-09 MED ORDER — DIPHENHYDRAMINE HCL 25 MG PO CAPS
25.0000 mg | ORAL_CAPSULE | Freq: Once | ORAL | Status: AC
  Administered 2021-01-09: 25 mg via ORAL

## 2021-01-09 MED ORDER — SODIUM CHLORIDE 0.9% IV SOLUTION
250.0000 mL | Freq: Once | INTRAVENOUS | Status: AC
  Administered 2021-01-09: 250 mL via INTRAVENOUS
  Filled 2021-01-09: qty 250

## 2021-01-09 NOTE — Patient Instructions (Signed)
Etoposide, VP-16 injection What is this medicine? ETOPOSIDE, VP-16 (e toe POE side) is a chemotherapy drug. It is used to treat testicular cancer, lung cancer, and other cancers. This medicine may be used for other purposes; ask your health care provider or pharmacist if you have questions. COMMON BRAND NAME(S): Etopophos, Toposar, VePesid What should I tell my health care provider before I take this medicine? They need to know if you have any of these conditions:  infection  kidney disease  liver disease  low blood counts, like low white cell, platelet, or red cell counts  an unusual or allergic reaction to etoposide, other medicines, foods, dyes, or preservatives  pregnant or trying to get pregnant  breast-feeding How should I use this medicine? This medicine is for infusion into a vein. It is administered in a hospital or clinic by a specially trained health care professional. Talk to your pediatrician regarding the use of this medicine in children. Special care may be needed. Overdosage: If you think you have taken too much of this medicine contact a poison control center or emergency room at once. NOTE: This medicine is only for you. Do not share this medicine with others. What if I miss a dose? It is important not to miss your dose. Call your doctor or health care professional if you are unable to keep an appointment. What may interact with this medicine? This medicine may interact with the following medications:  warfarin This list may not describe all possible interactions. Give your health care provider a list of all the medicines, herbs, non-prescription drugs, or dietary supplements you use. Also tell them if you smoke, drink alcohol, or use illegal drugs. Some items may interact with your medicine. What should I watch for while using this medicine? Visit your doctor for checks on your progress. This drug may make you feel generally unwell. This is not uncommon, as  chemotherapy can affect healthy cells as well as cancer cells. Report any side effects. Continue your course of treatment even though you feel ill unless your doctor tells you to stop. In some cases, you may be given additional medicines to help with side effects. Follow all directions for their use. Call your doctor or health care professional for advice if you get a fever, chills or sore throat, or other symptoms of a cold or flu. Do not treat yourself. This drug decreases your body's ability to fight infections. Try to avoid being around people who are sick. This medicine may increase your risk to bruise or bleed. Call your doctor or health care professional if you notice any unusual bleeding. Talk to your doctor about your risk of cancer. You may be more at risk for certain types of cancers if you take this medicine. Do not become pregnant while taking this medicine or for at least 6 months after stopping it. Women should inform their doctor if they wish to become pregnant or think they might be pregnant. Women of child-bearing potential will need to have a negative pregnancy test before starting this medicine. There is a potential for serious side effects to an unborn child. Talk to your health care professional or pharmacist for more information. Do not breast-feed an infant while taking this medicine. Men must use a latex condom during sexual contact with a woman while taking this medicine and for at least 4 months after stopping it. A latex condom is needed even if you have had a vasectomy. Contact your doctor right away if your partner  becomes pregnant. Do not donate sperm while taking this medicine and for at least 4 months after you stop taking this medicine. Men should inform their doctors if they wish to father a child. This medicine may lower sperm counts. What side effects may I notice from receiving this medicine? Side effects that you should report to your doctor or health care professional  as soon as possible:  allergic reactions like skin rash, itching or hives, swelling of the face, lips, or tongue  low blood counts - this medicine may decrease the number of white blood cells, red blood cells, and platelets. You may be at increased risk for infections and bleeding  nausea, vomiting  redness, blistering, peeling or loosening of the skin, including inside the mouth  signs and symptoms of infection like fever; chills; cough; sore throat; pain or trouble passing urine  signs and symptoms of low red blood cells or anemia such as unusually weak or tired; feeling faint or lightheaded; falls; breathing problems  unusual bruising or bleeding Side effects that usually do not require medical attention (report to your doctor or health care professional if they continue or are bothersome):  changes in taste  diarrhea  hair loss  loss of appetite  mouth sores This list may not describe all possible side effects. Call your doctor for medical advice about side effects. You may report side effects to FDA at 1-800-FDA-1088. Where should I keep my medicine? This drug is given in a hospital or clinic and will not be stored at home. NOTE: This sheet is a summary. It may not cover all possible information. If you have questions about this medicine, talk to your doctor, pharmacist, or health care provider.  2021 Elsevier/Gold Standard (2018-11-25 16:57:15) https://www.redcrossblood.org/donate-blood/blood-donation-process/what-happens-to-donated-blood/blood-transfusions/types-of-blood-transfusions.html"> https://www.hematology.org/education/patients/blood-basics/blood-safety-and-matching"> https://www.nhlbi.nih.gov/health-topics/blood-transfusion">  Blood Transfusion, Adult A blood transfusion is a procedure in which you receive blood or a type of blood cell (blood component) through an IV. You may need a blood transfusion when your blood level is low. This may result from a bleeding  disorder, illness, injury, or surgery. The blood may come from a donor. You may also be able to donate blood for yourself (autologous blood donation) before a planned surgery. The blood given in a transfusion is made up of different blood components. You may receive:  Red blood cells. These carry oxygen to the cells in the body.  Platelets. These help your blood to clot.  Plasma. This is the liquid part of your blood. It carries proteins and other substances throughout the body.  White blood cells. These help you fight infections. If you have hemophilia or another clotting disorder, you may also receive other types of blood products. Tell a health care provider about:  Any blood disorders you have.  Any previous reactions you have had during a blood transfusion.  Any allergies you have.  All medicines you are taking, including vitamins, herbs, eye drops, creams, and over-the-counter medicines.  Any surgeries you have had.  Any medical conditions you have, including any recent fever or cold symptoms.  Whether you are pregnant or may be pregnant. What are the risks? Generally, this is a safe procedure. However, problems may occur.  The most common problems include: ? A mild allergic reaction, such as red, swollen areas of skin (hives) and itching. ? Fever or chills. This may be the body's response to new blood cells received. This may occur during or up to 4 hours after the transfusion.  More serious problems may include: ? Transfusion-associated circulatory  overload (TACO), or too much fluid in the lungs. This may cause breathing problems. ? A serious allergic reaction, such as difficulty breathing or swelling around the face and lips. ? Transfusion-related acute lung injury (TRALI), which causes breathing difficulty and low oxygen in the blood. This can occur within hours of the transfusion or several days later. ? Iron overload. This can happen after receiving many blood  transfusions over a period of time. ? Infection or virus being transmitted. This is rare because donated blood is carefully tested before it is given. ? Hemolytic transfusion reaction. This is rare. It happens when your body's defense system (immune system)tries to attack the new blood cells. Symptoms may include fever, chills, nausea, low blood pressure, and low back or chest pain. ? Transfusion-associated graft-versus-host disease (TAGVHD). This is rare. It happens when donated cells attack your body's healthy tissues. What happens before the procedure? Medicines Ask your health care provider about:  Changing or stopping your regular medicines. This is especially important if you are taking diabetes medicines or blood thinners.  Taking medicines such as aspirin and ibuprofen. These medicines can thin your blood. Do not take these medicines unless your health care provider tells you to take them.  Taking over-the-counter medicines, vitamins, herbs, and supplements. General instructions  Follow instructions from your health care provider about eating and drinking restrictions.  You will have a blood test to determine your blood type. This is necessary to know what kind of blood your body will accept and to match it to the donor blood.  If you are going to have a planned surgery, you may be able to do an autologous blood donation. This may be done in case you need to have a transfusion.  You will have your temperature, blood pressure, and pulse monitored before the transfusion.  If you have had an allergic reaction to a transfusion in the past, you may be given medicine to help prevent a reaction. This medicine may be given to you by mouth (orally) or through an IV.  Set aside time for the blood transfusion. This procedure generally takes 1-4 hours to complete. What happens during the procedure?  An IV will be inserted into one of your veins.  The bag of donated blood will be attached to  your IV. The blood will then enter through your vein.  Your temperature, blood pressure, and pulse will be monitored regularly during the transfusion. This monitoring is done to detect early signs of a transfusion reaction.  Tell your nurse right away if you have any of these symptoms during the transfusion: ? Shortness of breath or trouble breathing. ? Chest or back pain. ? Fever or chills. ? Hives or itching.  If you have any signs or symptoms of a reaction, your transfusion will be stopped and you may be given medicine.  When the transfusion is complete, your IV will be removed.  Pressure may be applied to the IV site for a few minutes.  A bandage (dressing)will be applied. The procedure may vary among health care providers and hospitals.   What happens after the procedure?  Your temperature, blood pressure, pulse, breathing rate, and blood oxygen level will be monitored until you leave the hospital or clinic.  Your blood may be tested to see how you are responding to the transfusion.  You may be warmed with fluids or blankets to maintain a normal body temperature.  If you receive your blood transfusion in an outpatient setting, you will be  told whom to contact to report any reactions. Where to find more information For more information on blood transfusions, visit the American Red Cross: redcross.org Summary  A blood transfusion is a procedure in which you receive blood or a type of blood cell (blood component) through an IV.  The blood you receive may come from a donor or be donated by yourself (autologous blood donation) before a planned surgery.  The blood given in a transfusion is made up of different blood components. You may receive red blood cells, platelets, plasma, or white blood cells depending on the condition treated.  Your temperature, blood pressure, and pulse will be monitored before, during, and after the transfusion.  After the transfusion, your blood may  be tested to see how your body has responded. This information is not intended to replace advice given to you by your health care provider. Make sure you discuss any questions you have with your health care provider. Document Revised: 08/05/2019 Document Reviewed: 03/25/2019 Elsevier Patient Education  [place].

## 2021-01-10 ENCOUNTER — Telehealth: Payer: Self-pay | Admitting: Hematology

## 2021-01-10 ENCOUNTER — Inpatient Hospital Stay: Payer: Medicare Other

## 2021-01-10 ENCOUNTER — Other Ambulatory Visit: Payer: Self-pay

## 2021-01-10 VITALS — BP 122/70 | HR 78 | Temp 98.1°F | Resp 18

## 2021-01-10 DIAGNOSIS — Z853 Personal history of malignant neoplasm of breast: Secondary | ICD-10-CM | POA: Diagnosis not present

## 2021-01-10 DIAGNOSIS — C7800 Secondary malignant neoplasm of unspecified lung: Secondary | ICD-10-CM | POA: Diagnosis not present

## 2021-01-10 DIAGNOSIS — M8580 Other specified disorders of bone density and structure, unspecified site: Secondary | ICD-10-CM | POA: Diagnosis not present

## 2021-01-10 DIAGNOSIS — Z923 Personal history of irradiation: Secondary | ICD-10-CM | POA: Diagnosis not present

## 2021-01-10 DIAGNOSIS — Z170 Estrogen receptor positive status [ER+]: Secondary | ICD-10-CM | POA: Diagnosis not present

## 2021-01-10 DIAGNOSIS — C3490 Malignant neoplasm of unspecified part of unspecified bronchus or lung: Secondary | ICD-10-CM

## 2021-01-10 DIAGNOSIS — Z7189 Other specified counseling: Secondary | ICD-10-CM

## 2021-01-10 DIAGNOSIS — I10 Essential (primary) hypertension: Secondary | ICD-10-CM | POA: Diagnosis not present

## 2021-01-10 DIAGNOSIS — C7951 Secondary malignant neoplasm of bone: Secondary | ICD-10-CM | POA: Diagnosis not present

## 2021-01-10 DIAGNOSIS — Z5111 Encounter for antineoplastic chemotherapy: Secondary | ICD-10-CM | POA: Diagnosis not present

## 2021-01-10 DIAGNOSIS — Z79811 Long term (current) use of aromatase inhibitors: Secondary | ICD-10-CM | POA: Diagnosis not present

## 2021-01-10 DIAGNOSIS — M1990 Unspecified osteoarthritis, unspecified site: Secondary | ICD-10-CM | POA: Diagnosis not present

## 2021-01-10 DIAGNOSIS — C50411 Malignant neoplasm of upper-outer quadrant of right female breast: Secondary | ICD-10-CM | POA: Diagnosis not present

## 2021-01-10 DIAGNOSIS — Z9011 Acquired absence of right breast and nipple: Secondary | ICD-10-CM | POA: Diagnosis not present

## 2021-01-10 LAB — BPAM RBC
Blood Product Expiration Date: 202204222359
ISSUE DATE / TIME: 202203290737
Unit Type and Rh: 600

## 2021-01-10 LAB — TYPE AND SCREEN
ABO/RH(D): A NEG
Antibody Screen: NEGATIVE
Unit division: 0

## 2021-01-10 MED ORDER — SODIUM CHLORIDE 0.9 % IV SOLN
10.0000 mg | Freq: Once | INTRAVENOUS | Status: AC
  Administered 2021-01-10: 10 mg via INTRAVENOUS
  Filled 2021-01-10: qty 10

## 2021-01-10 MED ORDER — SODIUM CHLORIDE 0.9 % IV SOLN
INTRAVENOUS | Status: AC
  Filled 2021-01-10: qty 250

## 2021-01-10 MED ORDER — SODIUM CHLORIDE 0.9 % IV SOLN
90.0000 mg/m2 | Freq: Once | INTRAVENOUS | Status: AC
  Administered 2021-01-10: 130 mg via INTRAVENOUS
  Filled 2021-01-10: qty 6.5

## 2021-01-10 NOTE — Patient Instructions (Signed)
[facility] Discharge Instructions for Patients Receiving Chemotherapy  Today you received the following chemotherapy agents: etoposide  To help prevent nausea and vomiting after your treatment, we encourage you to take your nausea medication as directed.   If you develop nausea and vomiting that is not controlled by your nausea medication, call the clinic.   BELOW ARE SYMPTOMS THAT SHOULD BE REPORTED IMMEDIATELY:  *FEVER GREATER THAN 100.5 F  *CHILLS WITH OR WITHOUT FEVER  NAUSEA AND VOMITING THAT IS NOT CONTROLLED WITH YOUR NAUSEA MEDICATION  *UNUSUAL SHORTNESS OF BREATH  *UNUSUAL BRUISING OR BLEEDING  TENDERNESS IN MOUTH AND THROAT WITH OR WITHOUT PRESENCE OF ULCERS  *URINARY PROBLEMS  *BOWEL PROBLEMS  UNUSUAL RASH Items with * indicate a potential emergency and should be followed up as soon as possible.  Feel free to call the clinic should you have any questions or concerns. The clinic phone number is [phone].  Please show the CHEMO ALERT CARD at check-in to the Emergency Department and triage nurse.   

## 2021-01-10 NOTE — Progress Notes (Signed)
I spoke to Ms Tatum regarding her daughter's message about "throwiing up Phlegm."  Ms Beil states the phlegm is in her throat that makes her cough and vomit.  She states "I cant swallow only sip".  She denies that her throat is sore. I reviewed with Dr [name].  She wants MS Heberlein to take flonase and mucinex to see if this helps.  I sent her daughter, [name], a mychart message.

## 2021-01-10 NOTE — Telephone Encounter (Signed)
Left message with follow-up appointments per 3/28 los. Gave option to call back to reschedule if needed.

## 2021-01-11 ENCOUNTER — Other Ambulatory Visit: Payer: Self-pay

## 2021-01-11 ENCOUNTER — Telehealth: Payer: Self-pay | Admitting: Hematology

## 2021-01-11 ENCOUNTER — Encounter: Payer: Self-pay | Admitting: Internal Medicine

## 2021-01-11 DIAGNOSIS — K1230 Oral mucositis (ulcerative), unspecified: Secondary | ICD-10-CM

## 2021-01-11 MED ORDER — MAGIC MOUTHWASH W/LIDOCAINE: ORAL | 2 refills | Status: AC

## 2021-01-11 NOTE — Telephone Encounter (Signed)
Scheduled follow-up appointments per 3/28 los. Patient is aware.

## 2021-01-11 NOTE — Patient Instructions (Addendum)
Increase levothyroxine to 0.125 mg daily and follow-up in 6 weeks.  It was a pleasure to see you by virtual visit today.  Please let [place] know if you are unable to travel to that visit and it can perhaps be checked at the cancer center.  TSH checked on March 21 is now falling within normal limits.  However her free T4 has increased on March 28

## 2021-01-12 ENCOUNTER — Inpatient Hospital Stay: Payer: Medicare Other

## 2021-01-12 ENCOUNTER — Other Ambulatory Visit: Payer: Self-pay

## 2021-01-12 VITALS — BP 122/79 | HR 120 | Temp 97.7°F | Resp 24

## 2021-01-12 DIAGNOSIS — Z923 Personal history of irradiation: Secondary | ICD-10-CM | POA: Insufficient documentation

## 2021-01-12 DIAGNOSIS — M1990 Unspecified osteoarthritis, unspecified site: Secondary | ICD-10-CM | POA: Insufficient documentation

## 2021-01-12 DIAGNOSIS — Z5189 Encounter for other specified aftercare: Secondary | ICD-10-CM | POA: Insufficient documentation

## 2021-01-12 DIAGNOSIS — K222 Esophageal obstruction: Secondary | ICD-10-CM | POA: Diagnosis not present

## 2021-01-12 DIAGNOSIS — Z7189 Other specified counseling: Secondary | ICD-10-CM

## 2021-01-12 DIAGNOSIS — I10 Essential (primary) hypertension: Secondary | ICD-10-CM | POA: Insufficient documentation

## 2021-01-12 DIAGNOSIS — C50411 Malignant neoplasm of upper-outer quadrant of right female breast: Secondary | ICD-10-CM | POA: Insufficient documentation

## 2021-01-12 DIAGNOSIS — E860 Dehydration: Secondary | ICD-10-CM | POA: Diagnosis not present

## 2021-01-12 DIAGNOSIS — E876 Hypokalemia: Secondary | ICD-10-CM | POA: Diagnosis not present

## 2021-01-12 DIAGNOSIS — R1310 Dysphagia, unspecified: Secondary | ICD-10-CM | POA: Diagnosis not present

## 2021-01-12 DIAGNOSIS — Z9011 Acquired absence of right breast and nipple: Secondary | ICD-10-CM | POA: Insufficient documentation

## 2021-01-12 DIAGNOSIS — Z170 Estrogen receptor positive status [ER+]: Secondary | ICD-10-CM | POA: Insufficient documentation

## 2021-01-12 DIAGNOSIS — Z95828 Presence of other vascular implants and grafts: Secondary | ICD-10-CM

## 2021-01-12 DIAGNOSIS — M8580 Other specified disorders of bone density and structure, unspecified site: Secondary | ICD-10-CM | POA: Insufficient documentation

## 2021-01-12 DIAGNOSIS — C3490 Malignant neoplasm of unspecified part of unspecified bronchus or lung: Secondary | ICD-10-CM

## 2021-01-12 DIAGNOSIS — C7951 Secondary malignant neoplasm of bone: Secondary | ICD-10-CM | POA: Insufficient documentation

## 2021-01-12 DIAGNOSIS — Z79811 Long term (current) use of aromatase inhibitors: Secondary | ICD-10-CM | POA: Insufficient documentation

## 2021-01-12 DIAGNOSIS — C7800 Secondary malignant neoplasm of unspecified lung: Secondary | ICD-10-CM | POA: Insufficient documentation

## 2021-01-12 DIAGNOSIS — R110 Nausea: Secondary | ICD-10-CM | POA: Diagnosis not present

## 2021-01-12 DIAGNOSIS — Z853 Personal history of malignant neoplasm of breast: Secondary | ICD-10-CM | POA: Insufficient documentation

## 2021-01-12 DIAGNOSIS — R1110 Vomiting, unspecified: Secondary | ICD-10-CM | POA: Diagnosis not present

## 2021-01-12 MED ORDER — SODIUM CHLORIDE 0.9% FLUSH
10.0000 mL | Freq: Once | INTRAVENOUS | Status: AC
  Administered 2021-01-12: 10 mL
  Filled 2021-01-12: qty 10

## 2021-01-12 MED ORDER — PEGFILGRASTIM-CBQV 6 MG/0.6ML ~~LOC~~ SOSY
PREFILLED_SYRINGE | SUBCUTANEOUS | Status: AC
  Filled 2021-01-12: qty 0.6

## 2021-01-12 MED ORDER — PEGFILGRASTIM-CBQV 6 MG/0.6ML ~~LOC~~ SOSY
6.0000 mg | PREFILLED_SYRINGE | Freq: Once | SUBCUTANEOUS | Status: AC
  Administered 2021-01-12: 6 mg via SUBCUTANEOUS

## 2021-01-12 MED ORDER — SODIUM CHLORIDE 0.9 % IV SOLN
Freq: Once | INTRAVENOUS | Status: AC
  Filled 2021-01-12: qty 250

## 2021-01-12 MED ORDER — HEPARIN SOD (PORK) LOCK FLUSH 100 UNIT/ML IV SOLN
500.0000 [IU] | Freq: Once | INTRAVENOUS | Status: AC
Start: 2021-01-12 — End: 2021-01-12
  Administered 2021-01-12: 500 [IU]
  Filled 2021-01-12: qty 5

## 2021-01-12 NOTE — Patient Instructions (Addendum)
Pegfilgrastim injection What is this medicine? PEGFILGRASTIM (PEG fil gra stim) is a long-acting granulocyte colony-stimulating factor that stimulates the growth of neutrophils, a type of white blood cell important in the body's fight against infection. It is used to reduce the incidence of fever and infection in patients with certain types of cancer who are receiving chemotherapy that affects the bone marrow, and to increase survival after being exposed to high doses of radiation. This medicine may be used for other purposes; ask your health care provider or pharmacist if you have questions. COMMON BRAND NAME(S): Rexene Edison, Ziextenzo What should I tell my health care provider before I take this medicine? They need to know if you have any of these conditions:  kidney disease  latex allergy  ongoing radiation therapy  sickle cell disease  skin reactions to acrylic adhesives (On-Body Injector only)  an unusual or allergic reaction to pegfilgrastim, filgrastim, other medicines, foods, dyes, or preservatives  pregnant or trying to get pregnant  breast-feeding How should I use this medicine? This medicine is for injection under the skin. If you get this medicine at home, you will be taught how to prepare and give the pre-filled syringe or how to use the On-body Injector. Refer to the patient Instructions for Use for detailed instructions. Use exactly as directed. Tell your healthcare provider immediately if you suspect that the On-body Injector may not have performed as intended or if you suspect the use of the On-body Injector resulted in a missed or partial dose. It is important that you put your used needles and syringes in a special sharps container. Do not put them in a trash can. If you do not have a sharps container, call your pharmacist or healthcare provider to get one. Talk to your pediatrician regarding the use of this medicine in children. While this drug  may be prescribed for selected conditions, precautions do apply. Overdosage: If you think you have taken too much of this medicine contact a poison control center or emergency room at once. NOTE: This medicine is only for you. Do not share this medicine with others. What if I miss a dose? It is important not to miss your dose. Call your doctor or health care professional if you miss your dose. If you miss a dose due to an On-body Injector failure or leakage, a new dose should be administered as soon as possible using a single prefilled syringe for manual use. What may interact with this medicine? Interactions have not been studied. This list may not describe all possible interactions. Give your health care provider a list of all the medicines, herbs, non-prescription drugs, or dietary supplements you use. Also tell them if you smoke, drink alcohol, or use illegal drugs. Some items may interact with your medicine. What should I watch for while using this medicine? Your condition will be monitored carefully while you are receiving this medicine. You may need blood work done while you are taking this medicine. Talk to your health care provider about your risk of cancer. You may be more at risk for certain types of cancer if you take this medicine. If you are going to need a MRI, CT scan, or other procedure, tell your doctor that you are using this medicine (On-Body Injector only). What side effects may I notice from receiving this medicine? Side effects that you should report to your doctor or health care professional as soon as possible:  allergic reactions (skin rash, itching or hives, swelling of  the face, lips, or tongue)  back pain  dizziness  fever  pain, redness, or irritation at site where injected  pinpoint red spots on the skin  red or dark-brown urine  shortness of breath or breathing problems  stomach or side pain, or pain at the shoulder  swelling  tiredness  trouble  passing urine or change in the amount of urine  unusual bruising or bleeding Side effects that usually do not require medical attention (report to your doctor or health care professional if they continue or are bothersome):  bone pain  muscle pain This list may not describe all possible side effects. Call your doctor for medical advice about side effects. You may report side effects to FDA at 1-800-FDA-1088. Where should I keep my medicine? Keep out of the reach of children. If you are using this medicine at home, you will be instructed on how to store it. Throw away any unused medicine after the expiration date on the label. NOTE: This sheet is a summary. It may not cover all possible information. If you have questions about this medicine, talk to your doctor, pharmacist, or health care provider.  2021 Elsevier/Gold Standard (2019-10-22 13:20:51)   Dehydration, Adult Dehydration is condition in which there is not enough water or other fluids in the body. This happens when a person loses more fluids than he or she takes in. Important body parts cannot work right without the right amount of fluids. Any loss of fluids from the body can cause dehydration. Dehydration can be mild, worse, or very bad. It should be treated right away to keep it from getting very bad. What are the causes? This condition may be caused by:  Conditions that cause loss of water or other fluids, such as: ? Watery poop (diarrhea). ? Vomiting. ? Sweating a lot. ? Peeing (urinating) a lot.  Not drinking enough fluids, especially when you: ? Are ill. ? Are doing things that take a lot of energy to do.  Other illnesses and conditions, such as fever or infection.  Certain medicines, such as medicines that take extra fluid out of the body (diuretics).  Lack of safe drinking water.  Not being able to get enough water and food. What increases the risk? The following factors may make you more likely to develop  this condition:  Having a long-term (chronic) illness that has not been treated the right way, such as: ? Diabetes. ? Heart disease. ? Kidney disease.  Being 15 years of age or older.  Having a disability.  Living in a place that is high above the ground or sea (high in altitude). The thinner, dried air causes more fluid loss.  Doing exercises that put stress on your body for a long time. What are the signs or symptoms? Symptoms of dehydration depend on how bad it is. Mild or worse dehydration  Thirst.  Dry lips or dry mouth.  Feeling dizzy or light-headed, especially when you stand up from sitting.  Muscle cramps.  Your body making: ? Dark pee (urine). Pee may be the color of tea. ? Less pee than normal. ? Less tears than normal.  Headache. Very bad dehydration  Changes in skin. Skin may: ? Be cold to the touch (clammy). ? Be blotchy or pale. ? Not go back to normal right after you lightly pinch it and let it go.  Little or no tears, pee, or sweat.  Changes in vital signs, such as: ? Fast breathing. ? Low blood pressure. ?  Weak pulse. ? Pulse that is more than 100 beats a minute when you are sitting still.  Other changes, such as: ? Feeling very thirsty. ? Eyes that look hollow (sunken). ? Cold hands and feet. ? Being mixed up (confused). ? Being very tired (lethargic) or having trouble waking from sleep. ? Short-term weight loss. ? Loss of consciousness. How is this treated? Treatment for this condition depends on how bad it is. Treatment should start right away. Do not wait until your condition gets very bad. Very bad dehydration is an emergency. You will need to go to a hospital.  Mild or worse dehydration can be treated at home. You may be asked to: ? Drink more fluids. ? Drink an oral rehydration solution (ORS). This drink helps get the right amounts of fluids and salts and minerals in the blood (electrolytes).  Very bad dehydration can be  treated: ? With fluids through an IV tube. ? By getting normal levels of salts and minerals in your blood. This is often done by giving salts and minerals through a tube. The tube is passed through your nose and into your stomach. ? By treating the root cause. Follow these instructions at home: Oral rehydration solution If told by your doctor, drink an ORS:  Make an ORS. Use instructions on the package.  Start by drinking small amounts, about  cup (120 mL) every 5-10 minutes.  Slowly drink more until you have had the amount that your doctor said to have. Eating and drinking  Drink enough clear fluid to keep your pee pale yellow. If you were told to drink an ORS, finish the ORS first. Then, start slowly drinking other clear fluids. Drink fluids such as: ? Water. Do not drink only water. Doing that can make the salt (sodium) level in your body get too low. ? Water from ice chips you suck on. ? Fruit juice that you have added water to (diluted). ? Low-calorie sports drinks.  Eat foods that have the right amounts of salts and minerals, such as: ? Bananas. ? Oranges. ? Potatoes. ? Tomatoes. ? Spinach.  Do not drink alcohol.  Avoid: ? Drinks that have a lot of sugar. These include:  High-calorie sports drinks.  Fruit juice that you did not add water to.  Soda.  Caffeine. ? Foods that are greasy or have a lot of fat or sugar.         General instructions  Take over-the-counter and prescription medicines only as told by your doctor.  Do not take salt tablets. Doing that can make the salt level in your body get too high.  Return to your normal activities as told by your doctor. Ask your doctor what activities are safe for you.  Keep all follow-up visits as told by your doctor. This is important. Contact a doctor if:  You have pain in your belly (abdomen) and the pain: ? Gets worse. ? Stays in one place.  You have a rash.  You have a stiff neck.  You get angry  or annoyed (irritable) more easily than normal.  You are more tired or have a harder time waking than normal.  You feel: ? Weak or dizzy. ? Very thirsty. Get help right away if you have:  Any symptoms of very bad dehydration.  Symptoms of vomiting, such as: ? You cannot eat or drink without vomiting. ? Your vomiting gets worse or does not go away. ? Your vomit has blood or green stuff in it.  Symptoms that get worse with treatment.  A fever.  A very bad headache.  Problems with peeing or pooping (having a bowel movement), such as: ? Watery poop that gets worse or does not go away. ? Blood in your poop (stool). This may cause poop to look black and tarry. ? Not peeing in 6-8 hours. ? Peeing only a small amount of very dark pee in 6-8 hours.  Trouble breathing. These symptoms may be an emergency. Do not wait to see if the symptoms will go away. Get medical help right away. Call your local emergency services (911 in the [place] Do not drive yourself to the hospital. Summary  Dehydration is a condition in which there is not enough water or other fluids in the body. This happens when a person loses more fluids than he or she takes in.  Treatment for this condition depends on how bad it is. Treatment should be started right away. Do not wait until your condition gets very bad.  Drink enough clear fluid to keep your pee pale yellow. If you were told to drink an oral rehydration solution (ORS), finish the ORS first. Then, start slowly drinking other clear fluids.  Take over-the-counter and prescription medicines only as told by your doctor.  Get help right away if you have any symptoms of very bad dehydration. This information is not intended to replace advice given to you by your health care provider. Make sure you discuss any questions you have with your health care provider. Document Revised: 05/13/2019 Document Reviewed: 05/13/2019 Elsevier Patient Education  [place].

## 2021-01-12 NOTE — Progress Notes (Unsigned)
Per Dr. [name] ok for [facility] today with heart rate 120-126. Pt. Denies complaints of chest pain, dizziness, and denies shortness of breath, but respirations 24. Pt. to receive 1 liter NS over 2 hours today.

## 2021-01-12 NOTE — Patient Instructions (Signed)
Rehydration, Adult Rehydration is the replacement of body fluids, salts, and minerals (electrolytes) that are lost during dehydration. Dehydration is when there is not enough water or other fluids in the body. This happens when you lose more fluids than you take in. Common causes of dehydration include:  Not drinking enough fluids. This can occur when you are ill or doing activities that require a lot of energy, especially in hot weather.  Conditions that cause loss of water or other fluids, such as diarrhea, vomiting, sweating, or urinating a lot.  Other illnesses, such as fever or infection.  Certain medicines, such as those that remove excess fluid from the body (diuretics). Symptoms of mild or moderate dehydration may include thirst, dry lips and mouth, and dizziness. Symptoms of severe dehydration may include increased heart rate, confusion, fainting, and not urinating. For severe dehydration, you may need to get fluids through an IV at the hospital. For mild or moderate dehydration, you can usually rehydrate at home by drinking certain fluids as told by your health care provider. What are the risks? Generally, rehydration is safe. However, taking in too much fluid (overhydration) can be a problem. This is rare. Overhydration can cause an electrolyte imbalance, kidney failure, or a decrease in salt (sodium) levels in the body. Supplies needed You will need an oral rehydration solution (ORS) if your health care provider tells you to use one. This is a drink to treat dehydration. It can be found in pharmacies and retail stores. How to rehydrate Fluids Follow instructions from your health care provider for rehydration. The kind of fluid and the amount you should drink depend on your condition. In general, you should choose drinks that you prefer.  If told by your health care provider, drink an ORS. ? Make an ORS by following instructions on the package. ? Start by drinking small amounts,  about  cup (120 mL) every 5-10 minutes. ? Slowly increase how much you drink until you have taken the amount recommended by your health care provider.  Drink enough clear fluids to keep your urine pale yellow. If you were told to drink an ORS, finish it first, then start slowly drinking other clear fluids. Drink fluids such as: ? Water. This includes sparkling water and flavored water. Drinking only water can lead to having too little sodium in your body (hyponatremia). Follow the advice of your health care provider. ? Water from ice chips you suck on. ? Fruit juice with water you add to it (diluted). ? Sports drinks. ? Hot or cold herbal teas. ? Broth-based soups. ? Milk or milk products. Food Follow instructions from your health care provider about what to eat while you rehydrate. Your health care provider may recommend that you slowly begin eating regular foods in small amounts.  Eat foods that contain a healthy balance of electrolytes, such as bananas, oranges, potatoes, tomatoes, and spinach.  Avoid foods that are greasy or contain a lot of sugar. In some cases, you may get nutrition through a feeding tube that is passed through your nose and into your stomach (nasogastric tube, or NG tube). This may be done if you have uncontrolled vomiting or diarrhea.   Beverages to avoid Certain beverages may make dehydration worse. While you rehydrate, avoid drinking alcohol.   How to tell if you are recovering from dehydration You may be recovering from dehydration if:  You are urinating more often than before you started rehydrating.  Your urine is pale yellow.  Your energy level   improves.  You vomit less frequently.  You have diarrhea less frequently.  Your appetite improves or returns to normal.  You feel less dizzy or less light-headed.  Your skin tone and color start to look more normal. Follow these instructions at home:  Take over-the-counter and prescription medicines only  as told by your health care provider.  Do not take sodium tablets. Doing this can lead to having too much sodium in your body (hypernatremia). Contact a health care provider if:  You continue to have symptoms of mild or moderate dehydration, such as: ? Thirst. ? Dry lips. ? Slightly dry mouth. ? Dizziness. ? Dark urine or less urine than normal. ? Muscle cramps.  You continue to vomit or have diarrhea. Get help right away if you:  Have symptoms of dehydration that get worse.  Have a fever.  Have a severe headache.  Have been vomiting and the following happens: ? Your vomiting gets worse or does not go away. ? Your vomit includes blood or green matter (bile). ? You cannot eat or drink without vomiting.  Have problems with urination or bowel movements, such as: ? Diarrhea that gets worse or does not go away. ? Blood in your stool (feces). This may cause stool to look black and tarry. ? Not urinating, or urinating only a small amount of very dark urine, within 6-8 hours.  Have trouble breathing.  Have symptoms that get worse with treatment. These symptoms may represent a serious problem that is an emergency. Do not wait to see if the symptoms will go away. Get medical help right away. Call your local emergency services (911 in the [place] Do not drive yourself to the hospital. Summary  Rehydration is the replacement of body fluids and minerals (electrolytes) that are lost during dehydration.  Follow instructions from your health care provider for rehydration. The kind of fluid and amount you should drink depend on your condition.  Slowly increase how much you drink until you have taken the amount recommended by your health care provider.  Contact your health care provider if you continue to show signs of mild or moderate dehydration. This information is not intended to replace advice given to you by your health care provider. Make sure you discuss any questions you have with  your health care provider. Document Revised: 12/01/2019 Document Reviewed: 10/11/2019 Elsevier Patient Education  2021 [employer]vier Inc.  

## 2021-01-14 ENCOUNTER — Encounter (HOSPITAL_COMMUNITY): Payer: Self-pay | Admitting: Emergency Medicine

## 2021-01-14 ENCOUNTER — Other Ambulatory Visit: Payer: Self-pay

## 2021-01-14 ENCOUNTER — Inpatient Hospital Stay (HOSPITAL_COMMUNITY)
Admission: EM | Admit: 2021-01-14 | Discharge: 2021-02-11 | DRG: 391 | Disposition: E | Payer: Medicare Other | Attending: Family Medicine | Admitting: Family Medicine

## 2021-01-14 ENCOUNTER — Emergency Department (HOSPITAL_COMMUNITY): Payer: Medicare Other

## 2021-01-14 DIAGNOSIS — I10 Essential (primary) hypertension: Secondary | ICD-10-CM | POA: Diagnosis present

## 2021-01-14 DIAGNOSIS — R1110 Vomiting, unspecified: Secondary | ICD-10-CM | POA: Diagnosis not present

## 2021-01-14 DIAGNOSIS — Z515 Encounter for palliative care: Secondary | ICD-10-CM

## 2021-01-14 DIAGNOSIS — E876 Hypokalemia: Secondary | ICD-10-CM

## 2021-01-14 DIAGNOSIS — J45909 Unspecified asthma, uncomplicated: Secondary | ICD-10-CM | POA: Diagnosis present

## 2021-01-14 DIAGNOSIS — E872 Acidosis: Secondary | ICD-10-CM | POA: Diagnosis present

## 2021-01-14 DIAGNOSIS — N179 Acute kidney failure, unspecified: Secondary | ICD-10-CM | POA: Diagnosis present

## 2021-01-14 DIAGNOSIS — Z95828 Presence of other vascular implants and grafts: Secondary | ICD-10-CM

## 2021-01-14 DIAGNOSIS — E860 Dehydration: Secondary | ICD-10-CM

## 2021-01-14 DIAGNOSIS — J189 Pneumonia, unspecified organism: Secondary | ICD-10-CM

## 2021-01-14 DIAGNOSIS — Z170 Estrogen receptor positive status [ER+]: Secondary | ICD-10-CM

## 2021-01-14 DIAGNOSIS — D701 Agranulocytosis secondary to cancer chemotherapy: Secondary | ICD-10-CM | POA: Diagnosis present

## 2021-01-14 DIAGNOSIS — Z9011 Acquired absence of right breast and nipple: Secondary | ICD-10-CM

## 2021-01-14 DIAGNOSIS — T451X5A Adverse effect of antineoplastic and immunosuppressive drugs, initial encounter: Secondary | ICD-10-CM

## 2021-01-14 DIAGNOSIS — Z96651 Presence of right artificial knee joint: Secondary | ICD-10-CM | POA: Diagnosis present

## 2021-01-14 DIAGNOSIS — R1310 Dysphagia, unspecified: Secondary | ICD-10-CM

## 2021-01-14 DIAGNOSIS — E039 Hypothyroidism, unspecified: Secondary | ICD-10-CM | POA: Diagnosis present

## 2021-01-14 DIAGNOSIS — Z853 Personal history of malignant neoplasm of breast: Secondary | ICD-10-CM

## 2021-01-14 DIAGNOSIS — G928 Other toxic encephalopathy: Secondary | ICD-10-CM | POA: Diagnosis not present

## 2021-01-14 DIAGNOSIS — D61810 Antineoplastic chemotherapy induced pancytopenia: Secondary | ICD-10-CM | POA: Diagnosis present

## 2021-01-14 DIAGNOSIS — K222 Esophageal obstruction: Principal | ICD-10-CM | POA: Diagnosis present

## 2021-01-14 DIAGNOSIS — L8990 Pressure ulcer of unspecified site, unspecified stage: Secondary | ICD-10-CM | POA: Insufficient documentation

## 2021-01-14 DIAGNOSIS — Z825 Family history of asthma and other chronic lower respiratory diseases: Secondary | ICD-10-CM

## 2021-01-14 DIAGNOSIS — E870 Hyperosmolality and hypernatremia: Secondary | ICD-10-CM | POA: Diagnosis not present

## 2021-01-14 DIAGNOSIS — R54 Age-related physical debility: Secondary | ICD-10-CM | POA: Diagnosis present

## 2021-01-14 DIAGNOSIS — R64 Cachexia: Secondary | ICD-10-CM | POA: Diagnosis present

## 2021-01-14 DIAGNOSIS — Z6821 Body mass index (BMI) 21.0-21.9, adult: Secondary | ICD-10-CM

## 2021-01-14 DIAGNOSIS — Z79890 Hormone replacement therapy (postmenopausal): Secondary | ICD-10-CM

## 2021-01-14 DIAGNOSIS — C3490 Malignant neoplasm of unspecified part of unspecified bronchus or lung: Secondary | ICD-10-CM | POA: Diagnosis not present

## 2021-01-14 DIAGNOSIS — C787 Secondary malignant neoplasm of liver and intrahepatic bile duct: Secondary | ICD-10-CM | POA: Diagnosis present

## 2021-01-14 DIAGNOSIS — Z923 Personal history of irradiation: Secondary | ICD-10-CM

## 2021-01-14 DIAGNOSIS — Y842 Radiological procedure and radiotherapy as the cause of abnormal reaction of the patient, or of later complication, without mention of misadventure at the time of the procedure: Secondary | ICD-10-CM | POA: Diagnosis present

## 2021-01-14 DIAGNOSIS — Z8616 Personal history of COVID-19: Secondary | ICD-10-CM

## 2021-01-14 DIAGNOSIS — N390 Urinary tract infection, site not specified: Secondary | ICD-10-CM | POA: Diagnosis not present

## 2021-01-14 DIAGNOSIS — E440 Moderate protein-calorie malnutrition: Secondary | ICD-10-CM | POA: Insufficient documentation

## 2021-01-14 DIAGNOSIS — R627 Adult failure to thrive: Secondary | ICD-10-CM | POA: Diagnosis present

## 2021-01-14 DIAGNOSIS — K5900 Constipation, unspecified: Secondary | ICD-10-CM | POA: Diagnosis present

## 2021-01-14 DIAGNOSIS — Z888 Allergy status to other drugs, medicaments and biological substances status: Secondary | ICD-10-CM

## 2021-01-14 DIAGNOSIS — G9341 Metabolic encephalopathy: Secondary | ICD-10-CM | POA: Diagnosis not present

## 2021-01-14 DIAGNOSIS — K2081 Other esophagitis with bleeding: Secondary | ICD-10-CM | POA: Diagnosis present

## 2021-01-14 DIAGNOSIS — Z79899 Other long term (current) drug therapy: Secondary | ICD-10-CM

## 2021-01-14 DIAGNOSIS — K2900 Acute gastritis without bleeding: Secondary | ICD-10-CM | POA: Diagnosis present

## 2021-01-14 DIAGNOSIS — R000 Tachycardia, unspecified: Secondary | ICD-10-CM | POA: Diagnosis present

## 2021-01-14 DIAGNOSIS — B370 Candidal stomatitis: Secondary | ICD-10-CM | POA: Diagnosis present

## 2021-01-14 DIAGNOSIS — L89152 Pressure ulcer of sacral region, stage 2: Secondary | ICD-10-CM | POA: Diagnosis not present

## 2021-01-14 DIAGNOSIS — Z79818 Long term (current) use of other agents affecting estrogen receptors and estrogen levels: Secondary | ICD-10-CM

## 2021-01-14 DIAGNOSIS — Z7401 Bed confinement status: Secondary | ICD-10-CM

## 2021-01-14 DIAGNOSIS — K2101 Gastro-esophageal reflux disease with esophagitis, with bleeding: Secondary | ICD-10-CM | POA: Diagnosis present

## 2021-01-14 DIAGNOSIS — E871 Hypo-osmolality and hyponatremia: Secondary | ICD-10-CM | POA: Diagnosis present

## 2021-01-14 DIAGNOSIS — R110 Nausea: Secondary | ICD-10-CM | POA: Diagnosis not present

## 2021-01-14 DIAGNOSIS — Z20822 Contact with and (suspected) exposure to covid-19: Secondary | ICD-10-CM | POA: Diagnosis present

## 2021-01-14 DIAGNOSIS — C7951 Secondary malignant neoplasm of bone: Secondary | ICD-10-CM | POA: Diagnosis present

## 2021-01-14 DIAGNOSIS — Z8249 Family history of ischemic heart disease and other diseases of the circulatory system: Secondary | ICD-10-CM

## 2021-01-14 DIAGNOSIS — Z66 Do not resuscitate: Secondary | ICD-10-CM | POA: Diagnosis not present

## 2021-01-14 DIAGNOSIS — F419 Anxiety disorder, unspecified: Secondary | ICD-10-CM | POA: Diagnosis present

## 2021-01-14 DIAGNOSIS — R7989 Other specified abnormal findings of blood chemistry: Secondary | ICD-10-CM

## 2021-01-14 LAB — URINALYSIS, ROUTINE W REFLEX MICROSCOPIC
Bilirubin Urine: NEGATIVE
Glucose, UA: NEGATIVE mg/dL
Ketones, ur: 5 mg/dL — AB
Leukocytes,Ua: NEGATIVE
Nitrite: NEGATIVE
Protein, ur: NEGATIVE mg/dL
Specific Gravity, Urine: 1.005 (ref 1.005–1.030)
pH: 7 (ref 5.0–8.0)

## 2021-01-14 LAB — COMPREHENSIVE METABOLIC PANEL
ALT: 479 U/L — ABNORMAL HIGH (ref 0–44)
AST: 113 U/L — ABNORMAL HIGH (ref 15–41)
Albumin: 3.8 g/dL (ref 3.5–5.0)
Alkaline Phosphatase: 103 U/L (ref 38–126)
Anion gap: 13 (ref 5–15)
BUN: 17 mg/dL (ref 8–23)
CO2: 17 mmol/L — ABNORMAL LOW (ref 22–32)
Calcium: 8.7 mg/dL — ABNORMAL LOW (ref 8.9–10.3)
Chloride: 106 mmol/L (ref 98–111)
Creatinine, Ser: 0.84 mg/dL (ref 0.44–1.00)
GFR, Estimated: >60 mL/min (ref >60)
Glucose, Bld: 113 mg/dL — ABNORMAL HIGH (ref 70–99)
Potassium: 2.9 mmol/L — ABNORMAL LOW (ref 3.5–5.1)
Sodium: 136 mmol/L (ref 135–145)
Total Bilirubin: 2.7 mg/dL — ABNORMAL HIGH (ref 0.3–1.2)
Total Protein: 6.4 g/dL — ABNORMAL LOW (ref 6.5–8.1)

## 2021-01-14 LAB — CBC
HCT: 30.3 % — ABNORMAL LOW (ref 36.0–46.0)
Hemoglobin: 10 g/dL — ABNORMAL LOW (ref 12.0–15.0)
MCH: 27.5 pg (ref 26.0–34.0)
MCHC: 33 g/dL (ref 30.0–36.0)
MCV: 83.5 fL (ref 80.0–100.0)
Platelets: 89 10*3/uL — ABNORMAL LOW (ref 150–400)
RBC: 3.63 MIL/uL — ABNORMAL LOW (ref 3.87–5.11)
RDW: 17 % — ABNORMAL HIGH (ref 11.5–15.5)
WBC: 4.5 10*3/uL (ref 4.0–10.5)
nRBC: 0 % (ref 0.0–0.2)

## 2021-01-14 LAB — LIPASE, BLOOD: Lipase: 45 U/L (ref 11–51)

## 2021-01-14 LAB — MAGNESIUM: Magnesium: 1.7 mg/dL (ref 1.7–2.4)

## 2021-01-14 MED ORDER — LORAZEPAM 2 MG/ML PO CONC
0.5000 mg | Freq: Four times a day (QID) | ORAL | Status: DC | PRN
  Administered 2021-01-15 – 2021-01-29 (×6): 0.5 mg via ORAL
  Filled 2021-01-14 (×6): qty 1

## 2021-01-14 MED ORDER — BOOST / RESOURCE BREEZE PO LIQD CUSTOM
1.0000 | Freq: Three times a day (TID) | ORAL | Status: DC
  Administered 2021-01-15 – 2021-01-20 (×3): 1 via ORAL

## 2021-01-14 MED ORDER — SODIUM CHLORIDE 0.9 % IV BOLUS
1000.0000 mL | Freq: Once | INTRAVENOUS | Status: AC
  Administered 2021-01-14: 1000 mL via INTRAVENOUS

## 2021-01-14 MED ORDER — ORAL CARE MOUTH RINSE
15.0000 mL | Freq: Two times a day (BID) | OROMUCOSAL | Status: DC
  Administered 2021-01-15 – 2021-02-07 (×37): 15 mL via OROMUCOSAL

## 2021-01-14 MED ORDER — POTASSIUM CHLORIDE 2 MEQ/ML IV SOLN
INTRAVENOUS | Status: DC
  Filled 2021-01-14 (×7): qty 1000

## 2021-01-14 MED ORDER — MAGNESIUM SULFATE 2 GM/50ML IV SOLN
2.0000 g | Freq: Once | INTRAVENOUS | Status: AC
  Administered 2021-01-14: 2 g via INTRAVENOUS
  Filled 2021-01-14: qty 50

## 2021-01-14 MED ORDER — NYSTATIN 100000 UNIT/ML MT SUSP
5.0000 mL | Freq: Four times a day (QID) | OROMUCOSAL | Status: DC
  Administered 2021-01-14 – 2021-01-29 (×55): 500000 [IU] via ORAL
  Filled 2021-01-14 (×60): qty 5

## 2021-01-14 MED ORDER — METOPROLOL TARTRATE 5 MG/5ML IV SOLN
5.0000 mg | Freq: Four times a day (QID) | INTRAVENOUS | Status: DC
  Administered 2021-01-14 – 2021-01-31 (×66): 5 mg via INTRAVENOUS
  Filled 2021-01-14 (×66): qty 5

## 2021-01-14 MED ORDER — POTASSIUM CHLORIDE 20 MEQ PO PACK
40.0000 meq | PACK | Freq: Once | ORAL | Status: DC
  Filled 2021-01-14: qty 2

## 2021-01-14 MED ORDER — PANTOPRAZOLE SODIUM 40 MG IV SOLR
40.0000 mg | INTRAVENOUS | Status: DC
  Administered 2021-01-14 – 2021-01-24 (×11): 40 mg via INTRAVENOUS
  Filled 2021-01-14 (×11): qty 40

## 2021-01-14 MED ORDER — POTASSIUM CHLORIDE 10 MEQ/100ML IV SOLN
10.0000 meq | Freq: Once | INTRAVENOUS | Status: AC
  Administered 2021-01-14: 10 meq via INTRAVENOUS
  Filled 2021-01-14: qty 100

## 2021-01-14 MED ORDER — LIDOCAINE VISCOUS HCL 2 % MT SOLN
15.0000 mL | Freq: Once | OROMUCOSAL | Status: AC
  Administered 2021-01-14: 15 mL via OROMUCOSAL
  Filled 2021-01-14: qty 15

## 2021-01-14 MED ORDER — SODIUM CHLORIDE 0.9% FLUSH: 10.0000 mL | INTRAVENOUS | Status: DC | PRN

## 2021-01-14 MED ORDER — ACETAMINOPHEN 650 MG RE SUPP: 650.0000 mg | Freq: Four times a day (QID) | RECTAL | Status: DC | PRN

## 2021-01-14 MED ORDER — ONDANSETRON HCL 4 MG/2ML IJ SOLN
4.0000 mg | Freq: Four times a day (QID) | INTRAMUSCULAR | Status: DC | PRN
  Administered 2021-01-16 – 2021-01-26 (×10): 4 mg via INTRAVENOUS
  Filled 2021-01-14 (×10): qty 2

## 2021-01-14 MED ORDER — LEVOTHYROXINE SODIUM 100 MCG/5ML IV SOLN
50.0000 ug | Freq: Every day | INTRAVENOUS | Status: DC
  Administered 2021-01-15 – 2021-01-31 (×17): 50 ug via INTRAVENOUS
  Filled 2021-01-14 (×18): qty 5

## 2021-01-14 MED ORDER — CHLORHEXIDINE GLUCONATE CLOTH 2 % EX PADS
6.0000 | MEDICATED_PAD | Freq: Every day | CUTANEOUS | Status: DC
  Administered 2021-01-15 – 2021-02-05 (×21): 6 via TOPICAL

## 2021-01-14 MED ORDER — PANTOPRAZOLE SODIUM 40 MG IV SOLR
40.0000 mg | Freq: Once | INTRAVENOUS | Status: AC
  Administered 2021-01-14: 40 mg via INTRAVENOUS
  Filled 2021-01-14: qty 40

## 2021-01-14 MED ORDER — FENTANYL CITRATE (PF) 100 MCG/2ML IJ SOLN: 12.5000 ug | Freq: Once | INTRAMUSCULAR | Status: DC

## 2021-01-14 MED ORDER — ACETAMINOPHEN 325 MG PO TABS: 650.0000 mg | ORAL_TABLET | Freq: Four times a day (QID) | ORAL | Status: DC | PRN

## 2021-01-14 MED ORDER — SODIUM CHLORIDE 0.9% FLUSH
3.0000 mL | Freq: Two times a day (BID) | INTRAVENOUS | Status: DC
  Administered 2021-01-14 – 2021-02-06 (×19): 3 mL via INTRAVENOUS

## 2021-01-14 MED ORDER — SODIUM CHLORIDE 0.9% FLUSH
10.0000 mL | Freq: Two times a day (BID) | INTRAVENOUS | Status: DC
Start: 2021-01-14 — End: 2021-02-08
  Administered 2021-01-15 – 2021-02-07 (×22): 10 mL

## 2021-01-14 NOTE — H&P (Signed)
History and Physical   Tara Rodgers:[id number] DOB: [birth year] DOA: 02/01/2021  Referring MD/NP/PA: Coral Ceo, PA PCP: Elby Showers, MD Outpatient Specialists: Oncology, Dr. [name]  Patient coming from: Home  Chief Complaint: Dehydration, inability to take po  HPI: Tara Rodgers is a 75 y.o. female with a history of metastatic SCLC undergoing chemotherapy s/p XRT, transfusion-dependent anemia, thrombocytopenia, right breast CA s/p mastectomy 2016 on antiestrogen therapy who has experienced 3 weeks of constant, worsening, severe dysphagia, globus sensation, and odynophagia that has limited per oral intake severely to the point that she isn't able to tolerate any po and not able to take her medications. The past few days have become even worse with occasional emesis of gastric secretions and some blood.   ED Course: Tachycardic into 130's, afebrile and normotensive on arrival, with benign abdominal exam, hypokalemic (2.9) with NAGMA, and worse LFT elevations (AST 113, ALT 479, TBili 2.7).   Review of Systems: No fever, chills, and per HPI. All others reviewed and are negative.   Past Medical History:  Diagnosis Date  . Anxiety   . Asthma    does not take any medications, hx of inhaler use  . Breast cancer [facility]) 1995/2016   right sided breast cancer in 08/1994  . Bronchitis    hx of  . GERD (gastroesophageal reflux disease)   . History of hiatal hernia   . Hypertension   . Hyperthyroidism   . Lung cancer ([place]) 11/02/2020   Past Surgical History:  Procedure Laterality Date  . BREAST BIOPSY Right 2016   malignant  . BREAST EXCISIONAL BIOPSY Left 1996   benign  . Carpel Tunnel  Bilateral   . INCONTINENCE SURGERY     with mesh insertion about 7 years ago  . IR IMAGING GUIDED PORT INSERTION  11/29/2020  . LATISSIMUS FLAP TO BREAST Right 03/23/2015   Procedure: LATISSIMUS FLAP TO BREAST WITH PLACEMENT OF IMPLANT FOR BREAST RECONSTRUCTION;  Surgeon: [name], MD;   Location: [place];  Service: Plastics;  Laterality: Right;  . MASTECTOMY Right 03/23/2015  . RECONSTRUCTION BREAST W/ LATISSIMUS DORSI FLAP Right 03/23/2015  . TONSILLECTOMY     as a child  . TOTAL KNEE ARTHROPLASTY Right 03/13/2018   Procedure: RIGHT TOTAL KNEE ARTHROPLASTY;  Surgeon: Sydnee Cabal, MD;  Location: [facility];  Service: Orthopedics;  Laterality: Right;  Adductor Block  . TOTAL MASTECTOMY Right 03/23/2015   Procedure: RIGHT TOTAL MASTECTOMY;  Surgeon: [name], MD;  Location: [place];  Service: General;  Laterality: Right;   - Nonsmoker, lives with husband.  reports that she has never smoked. She has never used smokeless tobacco. She reports current alcohol use. She reports that she does not use drugs. Allergies  Allergen Reactions  . Fosamax [Alendronate Sodium] Other (See Comments)    Aching all over   Family History  Problem Relation Age of Onset  . COPD Father   . Heart disease Father    - Family history otherwise reviewed and not pertinent.  Prior to Admission medications   Medication Sig Start Date End Date Taking? Authorizing Provider  allopurinol (ZYLOPRIM) 300 MG tablet Take 1 tablet (300 mg total) by mouth daily. 11/17/20   [name], MD  ALPRAZolam Duanne Moron) 0.5 MG tablet Take 1 tablet by mouth twice daily as needed 12/24/20   Elby Showers, MD  cyclobenzaprine (FLEXERIL) 10 MG tablet Take 1 tablet (10 mg total) by mouth 3 (three) times daily as needed for muscle spasms. 10/18/19  Elby Showers, MD  esomeprazole (NEXIUM) 40 MG capsule One po daily at bedtime Patient taking differently: Take 40 mg by mouth daily. 08/24/20   Elby Showers, MD  fentaNYL (DURAGESIC) 12 MCG/HR PLACE 1 PATCH ONTO THE SKIN EVERY 3 DAYS. 01/01/21 06/30/21  [name], MD  HYDROcodone-acetaminophen (HYCET) 7.5-325 mg/15 ml solution Take 10-15 mLs by mouth every 4 (four) hours as needed for moderate pain or severe pain. 12/18/20   [name], MD  letrozole [facility]) 2.5 MG tablet Take 1 tablet  (2.5 mg total) by mouth daily. 03/30/20   [name], MD  levothyroxine (SYNTHROID) 125 MCG tablet Take 1 tablet (125 mcg total) by mouth daily. 12/22/20   Elby Showers, MD  lidocaine (LIDODERM) 5 % Place 1 patch onto the skin daily. Remove & Discard patch within 12 hours or as directed by MD 11/27/20   [name], MD  lidocaine-prilocaine (EMLA) cream Apply 1 application topically as needed. 12/11/20   [name], MD  magic mouthwash w/lidocaine SOLN Swish and spit 10 ml by mouth every 6 hours as needed 01/11/21   [name], MD  megestrol (MEGACE ES) 625 MG/5ML suspension Take 5 mLs (625 mg total) by mouth daily. 12/18/20   [name], MD  meloxicam (MOBIC) 7.5 MG tablet Take 7.5 mg by mouth 2 (two) times daily as needed for pain.    [provider]  metoprolol succinate (TOPROL-XL) 50 MG 24 hr tablet Take 1 tablet by mouth twice daily Patient taking differently: Take 50 mg by mouth 2 (two) times daily. 11/20/20   Lorretta Harp, MD  ondansetron (ZOFRAN) 8 MG tablet Take 1 tablet (8 mg total) by mouth 2 (two) times daily as needed for refractory nausea / vomiting. Start on day 3 after carboplatin chemo. 11/16/20   [name], MD  ondansetron (ZOFRAN-ODT) 8 MG disintegrating tablet Take 1 tablet (8 mg total) by mouth every 8 (eight) hours as needed for nausea or vomiting. 01/05/21   [name], MD  potassium chloride (MICRO-K) 10 MEQ CR capsule Take 1 capsule (10 mEq total) by mouth 2 (two) times daily. 12/14/20   [name], MD  potassium chloride SA (KLOR-CON) 20 MEQ tablet Take 1 tablet (20 mEq total) by mouth 2 (two) times daily. 12/11/20   [name], MD  prochlorperazine (COMPAZINE) 10 MG tablet Take 1 tablet (10 mg total) by mouth every 6 (six) hours as needed (Nausea or vomiting). 11/16/20   [name], MD  senna-docusate (SENOKOT-S) 8.6-50 MG tablet Take 1 tablet by mouth daily. 11/28/20   Carmin Muskrat, MD  verapamil (CALAN-SR) 240 MG CR tablet Take 1 tablet by mouth once daily Patient taking  differently: Take 240 mg by mouth daily. 09/11/20   Elby Showers, MD    Physical Exam: Vitals:   02/05/2021 1500 01/26/2021 1530 01/15/2021 1600 02/06/2021 1630  BP: 127/60 123/79 (!) 144/77 (!) 150/76  Pulse: (!) 130 (!) 42 (!) 117 (!) 109  Resp: 19 18 19 18   Temp:      TempSrc:      SpO2: 99% (!) 77% 98% 100%   Constitutional: 75 y.o. female in no distress, calm demeanor Eyes: Lids and conjunctivae normal, PERRL ENMT: Mucous membranes are tacky. No lingual leukoplakia.  Neck: No masses, no thyromegaly Respiratory: Non-labored breathing room air, 100% without accessory muscle use. Clear breath sounds to auscultation bilaterally Cardiovascular: Regular tachycardia 100-110's, no murmurs, rubs, or gallops. No carotid bruits. No JVD. No significant LE edema. Palpable  pedal pulses. Abdomen: Normoactive bowel sounds. No tenderness, non-distended, and no masses palpated. No hepatosplenomegaly. GU: No indwelling catheter Musculoskeletal: No clubbing / cyanosis. No joint deformity upper and lower extremities. Good ROM, no contractures. Normal muscle tone.  Skin: Warm, dry. Port site c/d/i nontender without erythema. No other rashes, wounds, or ulcers on visualized skin.  Neurologic: CN II-XII grossly intact. Speech normal. No focal deficits in motor strength or sensation in all extremities.  Psychiatric: Alert and oriented x3. Normal judgment and insight. Mood euthymic with congruent affect.   Labs on Admission: I have personally reviewed following labs and imaging studies  CBC: Recent Labs  Lab 01/08/21 0800 01/16/2021 1443  WBC 10.1 4.5  NEUTROABS 6.2  --   HGB 7.9* 10.0*  HCT 23.5* 30.3*  MCV 81.9 83.5  PLT 304 89*   Basic Metabolic Panel: Recent Labs  Lab 01/08/21 0800 01/15/2021 1443  NA 138 136  K 3.2* 2.9*  CL 103 106  CO2 20* 17*  GLUCOSE 100* 113*  BUN 15 17  CREATININE 0.95 0.84  CALCIUM 8.7* 8.7*  MG 1.6*  --    GFR: Estimated Creatinine Clearance: 40.7 mL/min (by C-G  formula based on SCr of 0.84 mg/dL). Liver Function Tests: Recent Labs  Lab 01/08/21 0800 02/04/2021 1443  AST 71* 113*  ALT 178* 479*  ALKPHOS 124 103  BILITOT 0.8 2.7*  PROT 6.1* 6.4*  ALBUMIN 3.5 3.8   Recent Labs  Lab 01/22/2021 1443  LIPASE 45   No results for input(s): AMMONIA in the last 168 hours. Coagulation Profile: No results for input(s): INR, PROTIME in the last 168 hours. Cardiac Enzymes: No results for input(s): CKTOTAL, CKMB, CKMBINDEX, TROPONINI in the last 168 hours. BNP (last 3 results) No results for input(s): PROBNP in the last 8760 hours. HbA1C: No results for input(s): HGBA1C in the last 72 hours. CBG: No results for input(s): GLUCAP in the last 168 hours. Lipid Profile: No results for input(s): CHOL, HDL, LDLCALC, TRIG, CHOLHDL, LDLDIRECT in the last 72 hours. Thyroid Function Tests: No results for input(s): TSH, T4TOTAL, FREET4, T3FREE, THYROIDAB in the last 72 hours. Anemia Panel: No results for input(s): VITAMINB12, FOLATE, FERRITIN, TIBC, IRON, RETICCTPCT in the last 72 hours. Urine analysis:    Component Value Date/Time   COLORURINE YELLOW 11/28/2020 1832   APPEARANCEUR HAZY (A) 11/28/2020 1832   LABSPEC 1.012 11/28/2020 1832   PHURINE 7.0 11/28/2020 1832   GLUCOSEU NEGATIVE 11/28/2020 1832   HGBUR NEGATIVE 11/28/2020 1832   BILIRUBINUR NEGATIVE 11/28/2020 1832   BILIRUBINUR NEG 09/05/2020 1215   KETONESUR NEGATIVE 11/28/2020 1832   PROTEINUR NEGATIVE 11/28/2020 1832   UROBILINOGEN 0.2 09/05/2020 1215   NITRITE NEGATIVE 11/28/2020 1832   LEUKOCYTESUR MODERATE (A) 11/28/2020 1832    No results found for this or any previous visit (from the past 240 hour(s)).   Radiological Exams on Admission: DG Chest 2 View  Result Date: 02/01/2021 CLINICAL DATA:  Dysphagia and chest pain with nausea and gagging as well as vomiting 3 weeks. History of metastatic small cell lung cancer with last chemotherapy 2 weeks ago. EXAM: CHEST - 2 VIEW COMPARISON:   11/13/2020 and CT 01/01/2021 FINDINGS: Right IJ Port-A-Cath has tip over the SVC. Lungs are adequately inflated without focal airspace consolidation or effusion. Cardiomediastinal silhouette is normal. Stable moderate sclerotic compression fractures over the thoracic and lumbar spine compared to 01/01/2021, although these are new compared to 10/26/2020. IMPRESSION: 1.  No acute cardiopulmonary disease. 2. Several moderate sclerotic compression  fractures of the thoracic and lumbar spine new since January 2022 but unchanged from 01/01/2021 likely metastatic in origin. Electronically Signed   By: Marin Olp M.D.   On: 01/16/2021 14:58   EKG: Independently reviewed. Sinus tachycardia without ischemic ST changes  Assessment/Plan Active Problems:   Dysphagia   Stage IV small cell lung cancer: Seen on PET 1/28 started on etoposide, carboplatin and atezolizumab 11/22/2020, s/p palliative mediastinal XRT 1/31 - 2/15.  - Oncology, Dr. [name] notified, will see 4/4.  History of breast CA: On antiestrogen which we will hold while unable to take po.  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 - Nystatin empirically - GI consulted, spoke with Dr. Alessandra Bevels who recommends NPO p MN, Dr. Paulita Fujita to see in AM for likely EGD.  HTN, tachycardia:  - Change metoprolol po to IV  LFT elevation: Multifactorial and exaggerated by hemoconcentration.  - RUQ U/S - Recheck LFTs in AM.   Anemia, thrombocytopenia: Strongly suspect both current values are artificially high and will come down with fluids.  - Monitor in AM, requires transfusions as outpatient - Hold pharmacologic DVT ppx.   Dehydration, malnutrition, hypokalemia:  - Given 2L IVF, will continue w/LR + KCl due to acidosis and hypokalemia.   Hypothyroidism:  - Change synthroid to IV for now. Has had good response following TSH.  History of covid-19 infection and s/p 2 doses of vaccine:  - Screening test ordered per protocol, recommended 3rd  dose.   DVT prophylaxis: SCDs due to thrombocytopenia  Code Status: DNR confirmed on admission  Family Communication: Husband at bedside Disposition Plan: Return home after rehydration and work up per GI Consults called: Oncology, GI  Admission status: Observation    Patrecia Pour, MD [facility] [email or web address] 02/04/2021, 5:54 PM

## 2021-01-14 NOTE — ED Triage Notes (Addendum)
Patient reports nausea with gagging, emesis, and dysphagia x3 weeks. Reports decreased intake as a result of dysphagia.  Last chemo x2 weeks ago.

## 2021-01-14 NOTE — ED Notes (Signed)
Pt wants blood drawn from port when she gets to the back

## 2021-01-14 NOTE — ED Provider Notes (Signed)
Pt undergoing chemo, having N/V and pain with swallowing. No abd pain, fever, diarrhea.   1:34 PM Exam: Gen NAD; Heart tachycardia; Lungs CTAB; Abd soft, NT, no rebound or guarding; Ext 2+ pedal pulses bilaterally, no edema.  BP (!) 142/88 (BP Location: Left Arm)   Pulse (!) 133   Temp 98.4 F (36.9 C) (Oral)   Resp 18   SpO2 100%   MSE was initiated and I personally evaluated the patient and placed orders (if any) at  1:33 PM on January 14, 2021.  The patient appears stable so that the remainder of the MSE may be completed by another provider.    Carlisle Cater, PA-C 01/13/2021 1335    Lucrezia Starch, MD 01/15/21 [phone]

## 2021-01-14 NOTE — ED Provider Notes (Signed)
[facility] [employer] DEPT Provider Note   CSN: [id number] Arrival date & time: 02/02/2021  1311     History Chief Complaint  Patient presents with  . Emesis  . Nausea  . Dysphagia    Tara Rodgers is a 75 y.o. female.  HPI   Pt is a 75 y/o F with a h/o anxiety, asthma, breast CA, lung ca with mets to liver, bones, mesentary who presents to the ED today for eval of dysphagia that has been ongoing for several weeks.   Reports gagging and spitting when she tries to eat. She has been unable to tolerate solid foods for 3 weeks. She has been able to tolerate water. Denies abd pain, fevers,   Reports some constipation, but was able to have a BM yesterday. Denies urinary complaints. Has a mild cough due to phlegm in mouth. She has had chronic doe since being dx with CA. Denies chest pain.   Being referred to GI.   Last chemo was 1 week ago.   Past Medical History:  Diagnosis Date  . Anxiety   . Asthma    does not take any medications, hx of inhaler use  . Breast cancer [facility]) 1995/2016   right sided breast cancer in 08/1994  . Bronchitis    hx of  . GERD (gastroesophageal reflux disease)   . History of hiatal hernia   . Hypertension   . Hyperthyroidism   . Lung cancer ([place]) 11/02/2020    Patient Active Problem List   Diagnosis Date Noted  . Dysphagia 01/17/2021  . Port-A-Cath in place 12/04/2020  . Small cell lung cancer ([place]) 11/16/2020  . Goals of care, counseling/discussion 11/16/2020  . Sinus tachycardia 11/16/2019  . Left carotid bruit 11/16/2019  . Stiffness of right knee 04/01/2018  . Osteoarthritis of knee 03/13/2018  . History of total knee arthroplasty 03/13/2018  . Genetic testing 02/13/2015  . Abnormal chromosomal and genetic finding on antenatal screening mother 02/13/2015  . 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 Hypertensive disorder 09/19/2012  . History of malignant neoplasm of breast 09/19/2012   . Asthma 09/19/2012  . Allergic rhinitis 09/19/2012  . Gastroesophageal reflux disease 09/19/2012  . Anxiety 09/19/2012  . Hyperlipidemia 09/19/2012  . Hypothyroidism 09/19/2012  . Osteoporosis 09/19/2012    Past Surgical History:  Procedure Laterality Date  . BREAST BIOPSY Right 2016   malignant  . BREAST EXCISIONAL BIOPSY Left 1996   benign  . Carpel Tunnel  Bilateral   . INCONTINENCE SURGERY     with mesh insertion about 7 years ago  . IR IMAGING GUIDED PORT INSERTION  11/29/2020  . LATISSIMUS FLAP TO BREAST Right 03/23/2015   Procedure: LATISSIMUS FLAP TO BREAST WITH PLACEMENT OF IMPLANT FOR BREAST RECONSTRUCTION;  Surgeon: [name], MD;  Location: [place];  Service: Plastics;  Laterality: Right;  . MASTECTOMY Right 03/23/2015  . RECONSTRUCTION BREAST W/ LATISSIMUS DORSI FLAP Right 03/23/2015  . TONSILLECTOMY     as a child  . TOTAL KNEE ARTHROPLASTY Right 03/13/2018   Procedure: RIGHT TOTAL KNEE ARTHROPLASTY;  Surgeon: Sydnee Cabal, MD;  Location: [facility];  Service: Orthopedics;  Laterality: Right;  Adductor Block  . TOTAL MASTECTOMY Right 03/23/2015   Procedure: RIGHT TOTAL MASTECTOMY;  Surgeon: [name], MD;  Location: [facility];  Service: General;  Laterality: Right;     OB History   No obstetric history on file.     Family History  Problem Relation Age  of Onset  . COPD Father   . Heart disease Father     Social History   Tobacco Use  . Smoking status: Never Smoker  . Smokeless tobacco: Never Used  Substance Use Topics  . Alcohol use: Yes    Comment: occasional  . Drug use: No    Home Medications Prior to Admission medications   Medication Sig Start Date End Date Taking? Authorizing Provider  allopurinol (ZYLOPRIM) 300 MG tablet Take 1 tablet (300 mg total) by mouth daily. 11/17/20   [name], MD  ALPRAZolam Duanne Moron) 0.5 MG tablet Take 1 tablet by mouth twice daily as needed 12/24/20   Elby Showers, MD  cyclobenzaprine (FLEXERIL) 10 MG tablet Take  1 tablet (10 mg total) by mouth 3 (three) times daily as needed for muscle spasms. 10/18/19   Elby Showers, MD  esomeprazole (NEXIUM) 40 MG capsule One po daily at bedtime Patient taking differently: Take 40 mg by mouth daily. 08/24/20   Elby Showers, MD  fentaNYL (DURAGESIC) 12 MCG/HR PLACE 1 PATCH ONTO THE SKIN EVERY 3 DAYS. 01/01/21 06/30/21  [name], MD  HYDROcodone-acetaminophen (HYCET) 7.5-325 mg/15 ml solution Take 10-15 mLs by mouth every 4 (four) hours as needed for moderate pain or severe pain. 12/18/20   [name], MD  letrozole [facility]) 2.5 MG tablet Take 1 tablet (2.5 mg total) by mouth daily. 03/30/20   [name], MD  levothyroxine (SYNTHROID) 125 MCG tablet Take 1 tablet (125 mcg total) by mouth daily. 12/22/20   Elby Showers, MD  lidocaine (LIDODERM) 5 % Place 1 patch onto the skin daily. Remove & Discard patch within 12 hours or as directed by MD 11/27/20   [name], MD  lidocaine-prilocaine (EMLA) cream Apply 1 application topically as needed. 12/11/20   [name], MD  magic mouthwash w/lidocaine SOLN Swish and spit 10 ml by mouth every 6 hours as needed 01/11/21   [name], MD  megestrol (MEGACE ES) 625 MG/5ML suspension Take 5 mLs (625 mg total) by mouth daily. 12/18/20   [name], MD  meloxicam (MOBIC) 7.5 MG tablet Take 7.5 mg by mouth 2 (two) times daily as needed for pain.    [provider]  metoprolol succinate (TOPROL-XL) 50 MG 24 hr tablet Take 1 tablet by mouth twice daily Patient taking differently: Take 50 mg by mouth 2 (two) times daily. 11/20/20   Lorretta Harp, MD  ondansetron (ZOFRAN) 8 MG tablet Take 1 tablet (8 mg total) by mouth 2 (two) times daily as needed for refractory nausea / vomiting. Start on day 3 after carboplatin chemo. 11/16/20   [name], MD  ondansetron (ZOFRAN-ODT) 8 MG disintegrating tablet Take 1 tablet (8 mg total) by mouth every 8 (eight) hours as needed for nausea or vomiting. 01/05/21   [name], MD  potassium chloride (MICRO-K) 10  MEQ CR capsule Take 1 capsule (10 mEq total) by mouth 2 (two) times daily. 12/14/20   [name], MD  potassium chloride SA (KLOR-CON) 20 MEQ tablet Take 1 tablet (20 mEq total) by mouth 2 (two) times daily. 12/11/20   [name], MD  prochlorperazine (COMPAZINE) 10 MG tablet Take 1 tablet (10 mg total) by mouth every 6 (six) hours as needed (Nausea or vomiting). 11/16/20   [name], MD  senna-docusate (SENOKOT-S) 8.6-50 MG tablet Take 1 tablet by mouth daily. 11/28/20   Carmin Muskrat, MD  verapamil (CALAN-SR) 240 MG CR tablet Take 1 tablet by mouth once  daily Patient taking differently: Take 240 mg by mouth daily. 09/11/20   Elby Showers, MD    Allergies    Fosamax [alendronate sodium]  Review of Systems   Review of Systems  Constitutional: Negative for chills and fever.  HENT: Negative for ear pain and sore throat.   Eyes: Negative for pain and visual disturbance.  Respiratory: Positive for shortness of breath (chronic unchanged).   Cardiovascular: Negative for chest pain.  Gastrointestinal: Positive for constipation. Negative for abdominal pain, diarrhea, nausea and vomiting.  Genitourinary: Negative for dysuria and hematuria.  Musculoskeletal: Negative for back pain.  Skin: Negative for color change and rash.  Neurological: Negative for seizures and syncope.  All other systems reviewed and are negative.   Physical Exam Updated Vital Signs BP (!) 150/76   Pulse (!) 109   Temp 98.4 F (36.9 C) (Oral)   Resp 18   SpO2 100%   Physical Exam Vitals and nursing note reviewed.  Constitutional:      General: She is not in acute distress.    Appearance: She is well-developed.     Comments: Frail, chronically ill appearing  HENT:     Head: Normocephalic and atraumatic.     Mouth/Throat:     Mouth: Mucous membranes are dry.  Eyes:     Conjunctiva/sclera: Conjunctivae normal.  Cardiovascular:     Rate and Rhythm: Regular rhythm. Tachycardia present.     Heart sounds: No murmur  heard.   Pulmonary:     Effort: Pulmonary effort is normal. No respiratory distress.     Breath sounds: Normal breath sounds. No wheezing, rhonchi or rales.  Abdominal:     General: Bowel sounds are normal.     Palpations: Abdomen is soft.     Tenderness: There is no abdominal tenderness. There is no guarding or rebound.  Musculoskeletal:     Cervical back: Neck supple.  Skin:    General: Skin is warm and dry.  Neurological:     Mental Status: She is alert.     ED Results / Procedures / Treatments   Labs (all labs ordered are listed, but only abnormal results are displayed) Labs Reviewed  COMPREHENSIVE METABOLIC PANEL - Abnormal; Notable for the following components:      Result Value   Potassium 2.9 (*)    CO2 17 (*)    Glucose, Bld 113 (*)    Calcium 8.7 (*)    Total Protein 6.4 (*)    AST 113 (*)    ALT 479 (*)    Total Bilirubin 2.7 (*)    All other components within normal limits  CBC - Abnormal; Notable for the following components:   RBC 3.63 (*)    Hemoglobin 10.0 (*)    HCT 30.3 (*)    RDW 17.0 (*)    Platelets 89 (*)    All other components within normal limits  LIPASE, BLOOD  URINALYSIS, ROUTINE W REFLEX MICROSCOPIC  MAGNESIUM    EKG EKG Interpretation  Date/Time:  Sunday January 14 2021 13:42:22 EDT Ventricular Rate:  123 PR Interval:  140 QRS Duration: 94 QT Interval:  319 QTC Calculation: 457 R Axis:   107 Text Interpretation: Sinus tachycardia Baseline wander in lead(s) I 12 Lead; Mason-Likar No STEMI Confirmed by Octaviano Glow [phone]) on 01/17/2021 2:44:13 PM   Radiology DG Chest 2 View  Result Date: 01/27/2021 CLINICAL DATA:  Dysphagia and chest pain with nausea and gagging as well as vomiting 3 weeks. History of  metastatic small cell lung cancer with last chemotherapy 2 weeks ago. EXAM: CHEST - 2 VIEW COMPARISON:  11/13/2020 and CT 01/01/2021 FINDINGS: Right IJ Port-A-Cath has tip over the SVC. Lungs are adequately inflated without focal  airspace consolidation or effusion. Cardiomediastinal silhouette is normal. Stable moderate sclerotic compression fractures over the thoracic and lumbar spine compared to 01/01/2021, although these are new compared to 10/26/2020. IMPRESSION: 1.  No acute cardiopulmonary disease. 2. Several moderate sclerotic compression fractures of the thoracic and lumbar spine new since January 2022 but unchanged from 01/01/2021 likely metastatic in origin. Electronically Signed   By: Marin Olp M.D.   On: 01/16/2021 14:58    Procedures Procedures   Medications Ordered in ED Medications  nystatin (MYCOSTATIN) 100000 UNIT/ML suspension 500,000 Units (500,000 Units Oral Given 02/09/2021 1553)  fentaNYL (SUBLIMAZE) injection 12.5 mcg (0 mcg Intravenous Hold 01/26/2021 1722)  lactated ringers 1,000 mL with potassium chloride 40 mEq infusion (has no administration in time range)  sodium chloride 0.9 % bolus 1,000 mL (0 mLs Intravenous Stopped 02/06/2021 1722)  lidocaine (XYLOCAINE) 2 % viscous mouth solution 15 mL (15 mLs Mouth/Throat Given 02/10/2021 1550)  pantoprazole (PROTONIX) injection 40 mg (40 mg Intravenous Given 01/18/2021 1549)  potassium chloride 10 mEq in 100 mL IVPB (10 mEq Intravenous New Bag/Given 01/31/2021 1628)  magnesium sulfate IVPB 2 g 50 mL (2 g Intravenous New Bag/Given 01/18/2021 1626)  sodium chloride 0.9 % bolus 1,000 mL (1,000 mLs Intravenous New Bag/Given 01/19/2021 1722)    ED Course  I have reviewed the triage vital signs and the nursing notes.  Pertinent labs & imaging results that were available during my care of the patient were reviewed by me and considered in my medical decision making (see chart for details).    MDM Rules/Calculators/A&P                          75 year old female presenting the emergency department today for evaluation of dysphagia ongoing for the last 3 weeks.  States she has not been able to tolerate solids in about 3 weeks and is barely able to tolerate liquids at home.  She is  currently in the process of getting referred to GI  Reviewed/interpreted labs CBC shows anemia of 10 which is improved from prior, thrombocytopenia noted CMP with potassium of 2.9, LFTs are elevated which are worse from prior and total bili is elevated 2.7. Lipase is negative UA and magnesium are pending on admission.  4:57 PM CONSULT with Dr. [name] who states that her elevations in LFTs are likely multifactorial from her liver mets. This could also be medication related. She states she will consult on pt if she gets admitted.   5:22 PM Sent secure chat to Dr. Rosalie Gums with GI who will consult on the patient.   5:23 PM CONSULT with Dr. Bonner Puna with hospitalist service who will evaluate the pt and determine disposition decision.   Final Clinical Impression(s) / ED Diagnoses Final diagnoses:  Dehydration  Hypokalemia    Rx / DC Orders ED Discharge Orders    None       Rodney Booze, PA-C 02/06/2021 1755    Lucrezia Starch, MD 01/15/21 [phone]

## 2021-01-14 NOTE — ED Notes (Signed)
ED TO INPATIENT HANDOFF REPORT  Name/Age/Gender Tara Rodgers 75 y.o. female  Code Status Code Status History    Date Active Date Inactive Code Status Order ID Comments User Context   03/13/2018 1743 03/15/2018 1455 Full Code [id number]  Lajean Manes, PA-C Inpatient   Advance Care Planning Activity    Questions for Most Recent Historical Code Status (Order [id number])         Advance Directive Documentation   Flowsheet Row Most Recent Value  Type of Advance Directive Living will  Pre-existing out of facility DNR order (yellow form or pink MOST form) --  "MOST" Form in Place? --      Home/SNF/Other Home  Chief Complaint Dysphagia [R13.10]  Level of Care/Admitting Diagnosis ED Disposition    ED Disposition Condition [place]: [place] [100102]  Level of Care: Med-Surg [16]  Covid Evaluation: Asymptomatic Screening Protocol (No Symptoms)  Diagnosis: Dysphagia [297989]  Admitting Physician: Patrecia Pour [phone]  Attending Physician: Patrecia Pour [phone]       Medical History Past Medical History:  Diagnosis Date  . Anxiety   . Asthma    does not take any medications, hx of inhaler use  . Breast cancer [facility]) 1995/2016   right sided breast cancer in 08/1994  . Bronchitis    hx of  . GERD (gastroesophageal reflux disease)   . History of hiatal hernia   . Hypertension   . Hyperthyroidism   . Lung cancer ([place]) 11/02/2020    Allergies Allergies  Allergen Reactions  . Fosamax [Alendronate Sodium] Other (See Comments)    Aching all over    IV Location/Drains/Wounds Patient Lines/Drains/Airways Status    Active Line/Drains/Airways    Name Placement date Placement time Site Days   Implanted Port 11/29/20 Right Chest 11/29/20  1208  Chest  46   Closed System Drain 1 Right;Lateral;Midline Chest Bulb (JP) 19 Fr. 03/23/15  1000  Chest  2124   Closed System Drain 2 Right;Lateral;Medial Chest Bulb (JP) 19 Fr.  03/23/15  1010  Chest  2124   Closed System Drain 3 Right;Anterior;Lateral Breast Bulb (JP) 19 Fr. 03/23/15  1230  Breast  2124   Closed System Drain 1 Right Knee Accordion (Hemovac) 10 Fr. 03/13/18  1453  Knee  1038   Incision (Closed) 03/23/15 Chest Right 03/23/15  1107  -- 2124   Incision (Closed) 03/23/15 Breast Right 03/23/15  1256  -- 2124   Incision (Closed) 03/13/18 Knee Right 03/13/18  1501  -- 1038   Incision (Closed) 11/13/20 Neck Right 11/13/20  1523  -- 62   Incision (Closed) 11/13/20 Back Right 11/13/20  1540  -- 62          Labs/Imaging Results for orders placed or performed during the hospital encounter of 01/18/2021 (from the past 48 hour(s))  Lipase, blood     Status: None   Collection Time: 01/13/2021  2:43 PM  Result Value Ref Range   Lipase 45 11 - 51 U/L    Comment: Performed at [facility], [place] [address], [place], [place] [address]  Comprehensive metabolic panel     Status: Abnormal   Collection Time: 01/13/2021  2:43 PM  Result Value Ref Range   Sodium 136 135 - 145 mmol/L   Potassium 2.9 (L) 3.5 - 5.1 mmol/L   Chloride 106 98 - 111 mmol/L   CO2 17 (L) 22 - 32 mmol/L   Glucose, Bld 113 (  H) 70 - 99 mg/dL    Comment: Glucose reference range applies only to samples taken after fasting for at least 8 hours.   BUN 17 8 - 23 mg/dL   Creatinine, Ser 0.84 0.44 - 1.00 mg/dL   Calcium 8.7 (L) 8.9 - 10.3 mg/dL   Total Protein 6.4 (L) 6.5 - 8.1 g/dL   Albumin 3.8 3.5 - 5.0 g/dL   AST 113 (H) 15 - 41 U/L   ALT 479 (H) 0 - 44 U/L   Alkaline Phosphatase 103 38 - 126 U/L   Total Bilirubin 2.7 (H) 0.3 - 1.2 mg/dL   GFR, Estimated >60 >60 mL/min    Comment: (NOTE) Calculated using the CKD-EPI Creatinine Equation (2021)    Anion gap 13 5 - 15    Comment: Performed at [facility], [place] [address] [place], [place] [address]  CBC     Status: Abnormal   Collection Time: 02/10/2021  2:43 PM  Result Value Ref Range   WBC 4.5 4.0 - 10.5  K/uL   RBC 3.63 (L) 3.87 - 5.11 MIL/uL   Hemoglobin 10.0 (L) 12.0 - 15.0 g/dL   HCT 30.3 (L) 36.0 - 46.0 %   MCV 83.5 80.0 - 100.0 fL   MCH 27.5 26.0 - 34.0 pg   MCHC 33.0 30.0 - 36.0 g/dL   RDW 17.0 (H) 11.5 - 15.5 %   Platelets 89 (L) 150 - 400 K/uL    Comment: SPECIMEN CHECKED FOR CLOTS Immature Platelet Fraction may be clinically indicated, consider ordering this additional test [id number] REPEATED TO VERIFY PLATELET COUNT CONFIRMED BY SMEAR    nRBC 0.0 0.0 - 0.2 %    Comment: Performed at [facility], [place] [address] [place], [place] [address]   DG Chest 2 View  Result Date: 01/12/2021 CLINICAL DATA:  Dysphagia and chest pain with nausea and gagging as well as vomiting 3 weeks. History of metastatic small cell lung cancer with last chemotherapy 2 weeks ago. EXAM: CHEST - 2 VIEW COMPARISON:  11/13/2020 and CT 01/01/2021 FINDINGS: Right IJ Port-A-Cath has tip over the SVC. Lungs are adequately inflated without focal airspace consolidation or effusion. Cardiomediastinal silhouette is normal. Stable moderate sclerotic compression fractures over the thoracic and lumbar spine compared to 01/01/2021, although these are new compared to 10/26/2020. IMPRESSION: 1.  No acute cardiopulmonary disease. 2. Several moderate sclerotic compression fractures of the thoracic and lumbar spine new since January 2022 but unchanged from 01/01/2021 likely metastatic in origin. Electronically Signed   By: Marin Olp M.D.   On: 01/13/2021 14:58    Pending Labs Unresulted Labs (From admission, onward)          Start     Ordered   02/05/2021 1809  SARS CORONAVIRUS 2 (TAT 6-24 HRS) Nasopharyngeal Nasopharyngeal Swab  (Tier 3 - Symptomatic/asymptomatic with Precautions)  Once,   STAT       Question Answer Comment  Is this test for diagnosis or screening Screening   Symptomatic for COVID-19 as defined by CDC No   Hospitalized for COVID-19 No   Admitted to ICU for COVID-19 No   Previously  tested for COVID-19 Yes   Resident in a congregate (group) care setting No   Employed in healthcare setting No   Pregnant No   Has patient completed COVID vaccination(s) (2 doses of Pfizer/Moderna 1 dose of [employer]) Yes   Has patient completed COVID Booster / 3rd dose No  02/03/2021 1808   01/18/2021 1558  Magnesium  Add-on,   AD        01/18/2021 1557   01/19/2021 1329  Urinalysis, Routine w reflex microscopic Urine, Clean Catch  ONCE - STAT,   STAT        01/12/2021 1328   Signed and Held  Comprehensive metabolic panel  Tomorrow morning,   R        Signed and Held   Signed and Held  CBC  Tomorrow morning,   R        Signed and Held          Vitals/Pain Today's Vitals   01/22/2021 1500 01/27/2021 1530 02/06/2021 1600 01/22/2021 1630  BP: 127/60 123/79 (!) 144/77 (!) 150/76  Pulse: (!) 130 (!) 42 (!) 117 (!) 109  Resp: 19 18 19 18   Temp:      TempSrc:      SpO2: 99% (!) 77% 98% 100%  PainSc:        Isolation Precautions Airborne and Contact precautions  Medications Medications  nystatin (MYCOSTATIN) 100000 UNIT/ML suspension 500,000 Units (0 Units Oral Hold 01/13/2021 1804)  fentaNYL (SUBLIMAZE) injection 12.5 mcg (0 mcg Intravenous Hold 02/07/2021 1722)  lactated ringers 1,000 mL with potassium chloride 40 mEq infusion (has no administration in time range)  sodium chloride 0.9 % bolus 1,000 mL (0 mLs Intravenous Stopped 02/03/2021 1722)  lidocaine (XYLOCAINE) 2 % viscous mouth solution 15 mL (15 mLs Mouth/Throat Given 01/20/2021 1550)  pantoprazole (PROTONIX) injection 40 mg (40 mg Intravenous Given 01/17/2021 1549)  potassium chloride 10 mEq in 100 mL IVPB (0 mEq Intravenous Stopped 02/10/2021 1804)  magnesium sulfate IVPB 2 g 50 mL (0 g Intravenous Stopped 01/25/2021 1804)  sodium chloride 0.9 % bolus 1,000 mL (0 mLs Intravenous Stopped 01/13/2021 1847)    Mobility walks

## 2021-01-15 ENCOUNTER — Observation Stay (HOSPITAL_COMMUNITY): Payer: Medicare Other

## 2021-01-15 DIAGNOSIS — N179 Acute kidney failure, unspecified: Secondary | ICD-10-CM | POA: Diagnosis present

## 2021-01-15 DIAGNOSIS — T451X5A Adverse effect of antineoplastic and immunosuppressive drugs, initial encounter: Secondary | ICD-10-CM | POA: Diagnosis not present

## 2021-01-15 DIAGNOSIS — D709 Neutropenia, unspecified: Secondary | ICD-10-CM | POA: Diagnosis not present

## 2021-01-15 DIAGNOSIS — D701 Agranulocytosis secondary to cancer chemotherapy: Secondary | ICD-10-CM | POA: Diagnosis not present

## 2021-01-15 DIAGNOSIS — Z853 Personal history of malignant neoplasm of breast: Secondary | ICD-10-CM | POA: Diagnosis not present

## 2021-01-15 DIAGNOSIS — R7989 Other specified abnormal findings of blood chemistry: Secondary | ICD-10-CM | POA: Diagnosis not present

## 2021-01-15 DIAGNOSIS — R1310 Dysphagia, unspecified: Secondary | ICD-10-CM | POA: Diagnosis not present

## 2021-01-15 DIAGNOSIS — E860 Dehydration: Secondary | ICD-10-CM | POA: Diagnosis present

## 2021-01-15 DIAGNOSIS — K2900 Acute gastritis without bleeding: Secondary | ICD-10-CM | POA: Diagnosis not present

## 2021-01-15 DIAGNOSIS — R531 Weakness: Secondary | ICD-10-CM | POA: Diagnosis not present

## 2021-01-15 DIAGNOSIS — B370 Candidal stomatitis: Secondary | ICD-10-CM | POA: Diagnosis present

## 2021-01-15 DIAGNOSIS — R0602 Shortness of breath: Secondary | ICD-10-CM | POA: Diagnosis not present

## 2021-01-15 DIAGNOSIS — C7951 Secondary malignant neoplasm of bone: Secondary | ICD-10-CM | POA: Diagnosis present

## 2021-01-15 DIAGNOSIS — Z20822 Contact with and (suspected) exposure to covid-19: Secondary | ICD-10-CM | POA: Diagnosis present

## 2021-01-15 DIAGNOSIS — Y842 Radiological procedure and radiotherapy as the cause of abnormal reaction of the patient, or of later complication, without mention of misadventure at the time of the procedure: Secondary | ICD-10-CM | POA: Diagnosis present

## 2021-01-15 DIAGNOSIS — R64 Cachexia: Secondary | ICD-10-CM | POA: Diagnosis present

## 2021-01-15 DIAGNOSIS — Z95828 Presence of other vascular implants and grafts: Secondary | ICD-10-CM | POA: Diagnosis not present

## 2021-01-15 DIAGNOSIS — L89152 Pressure ulcer of sacral region, stage 2: Secondary | ICD-10-CM | POA: Diagnosis not present

## 2021-01-15 DIAGNOSIS — E872 Acidosis: Secondary | ICD-10-CM | POA: Diagnosis present

## 2021-01-15 DIAGNOSIS — C787 Secondary malignant neoplasm of liver and intrahepatic bile duct: Secondary | ICD-10-CM | POA: Diagnosis present

## 2021-01-15 DIAGNOSIS — R1319 Other dysphagia: Secondary | ICD-10-CM | POA: Diagnosis not present

## 2021-01-15 DIAGNOSIS — D61810 Antineoplastic chemotherapy induced pancytopenia: Secondary | ICD-10-CM | POA: Diagnosis present

## 2021-01-15 DIAGNOSIS — G9341 Metabolic encephalopathy: Secondary | ICD-10-CM | POA: Diagnosis not present

## 2021-01-15 DIAGNOSIS — Z66 Do not resuscitate: Secondary | ICD-10-CM | POA: Diagnosis not present

## 2021-01-15 DIAGNOSIS — E870 Hyperosmolality and hypernatremia: Secondary | ICD-10-CM | POA: Diagnosis not present

## 2021-01-15 DIAGNOSIS — I10 Essential (primary) hypertension: Secondary | ICD-10-CM | POA: Diagnosis not present

## 2021-01-15 DIAGNOSIS — Z515 Encounter for palliative care: Secondary | ICD-10-CM | POA: Diagnosis not present

## 2021-01-15 DIAGNOSIS — G893 Neoplasm related pain (acute) (chronic): Secondary | ICD-10-CM | POA: Diagnosis not present

## 2021-01-15 DIAGNOSIS — G928 Other toxic encephalopathy: Secondary | ICD-10-CM | POA: Diagnosis not present

## 2021-01-15 DIAGNOSIS — N390 Urinary tract infection, site not specified: Secondary | ICD-10-CM | POA: Diagnosis not present

## 2021-01-15 DIAGNOSIS — C3490 Malignant neoplasm of unspecified part of unspecified bronchus or lung: Secondary | ICD-10-CM | POA: Diagnosis not present

## 2021-01-15 DIAGNOSIS — E440 Moderate protein-calorie malnutrition: Secondary | ICD-10-CM | POA: Diagnosis present

## 2021-01-15 DIAGNOSIS — Z7189 Other specified counseling: Secondary | ICD-10-CM | POA: Diagnosis not present

## 2021-01-15 DIAGNOSIS — K2081 Other esophagitis with bleeding: Secondary | ICD-10-CM | POA: Diagnosis not present

## 2021-01-15 DIAGNOSIS — D696 Thrombocytopenia, unspecified: Secondary | ICD-10-CM | POA: Diagnosis not present

## 2021-01-15 DIAGNOSIS — K222 Esophageal obstruction: Secondary | ICD-10-CM | POA: Diagnosis not present

## 2021-01-15 DIAGNOSIS — E871 Hypo-osmolality and hyponatremia: Secondary | ICD-10-CM | POA: Diagnosis present

## 2021-01-15 DIAGNOSIS — E039 Hypothyroidism, unspecified: Secondary | ICD-10-CM | POA: Diagnosis not present

## 2021-01-15 DIAGNOSIS — M1711 Unilateral primary osteoarthritis, right knee: Secondary | ICD-10-CM | POA: Diagnosis not present

## 2021-01-15 DIAGNOSIS — J189 Pneumonia, unspecified organism: Secondary | ICD-10-CM | POA: Diagnosis not present

## 2021-01-15 DIAGNOSIS — K2101 Gastro-esophageal reflux disease with esophagitis, with bleeding: Secondary | ICD-10-CM | POA: Diagnosis not present

## 2021-01-15 LAB — COMPREHENSIVE METABOLIC PANEL
ALT: 338 U/L — ABNORMAL HIGH (ref 0–44)
AST: 74 U/L — ABNORMAL HIGH (ref 15–41)
Albumin: 3.1 g/dL — ABNORMAL LOW (ref 3.5–5.0)
Alkaline Phosphatase: 82 U/L (ref 38–126)
Anion gap: 9 (ref 5–15)
BUN: 10 mg/dL (ref 8–23)
CO2: 18 mmol/L — ABNORMAL LOW (ref 22–32)
Calcium: 7.8 mg/dL — ABNORMAL LOW (ref 8.9–10.3)
Chloride: 106 mmol/L (ref 98–111)
Creatinine, Ser: 0.43 mg/dL — ABNORMAL LOW (ref 0.44–1.00)
GFR, Estimated: >60 mL/min (ref >60)
Glucose, Bld: 89 mg/dL (ref 70–99)
Potassium: 3.1 mmol/L — ABNORMAL LOW (ref 3.5–5.1)
Sodium: 133 mmol/L — ABNORMAL LOW (ref 135–145)
Total Bilirubin: 2.8 mg/dL — ABNORMAL HIGH (ref 0.3–1.2)
Total Protein: 5.4 g/dL — ABNORMAL LOW (ref 6.5–8.1)

## 2021-01-15 LAB — CBC WITH DIFFERENTIAL/PLATELET
Abs Immature Granulocytes: 0.05 10*3/uL (ref 0.00–0.07)
Basophils Absolute: 0 10*3/uL (ref 0.0–0.1)
Basophils Relative: 2 %
Eosinophils Absolute: 0 10*3/uL (ref 0.0–0.5)
Eosinophils Relative: 0 %
HCT: 24.4 % — ABNORMAL LOW (ref 36.0–46.0)
Hemoglobin: 8 g/dL — ABNORMAL LOW (ref 12.0–15.0)
Immature Granulocytes: 8 %
Lymphocytes Relative: 17 %
Lymphs Abs: 0.1 10*3/uL — ABNORMAL LOW (ref 0.7–4.0)
MCH: 27.5 pg (ref 26.0–34.0)
MCHC: 32.8 g/dL (ref 30.0–36.0)
MCV: 83.8 fL (ref 80.0–100.0)
Monocytes Absolute: 0 10*3/uL — ABNORMAL LOW (ref 0.1–1.0)
Monocytes Relative: 2 %
Neutro Abs: 0.4 10*3/uL — CL (ref 1.7–7.7)
Neutrophils Relative %: 71 %
Platelets: 42 10*3/uL — ABNORMAL LOW (ref 150–400)
RBC: 2.91 MIL/uL — ABNORMAL LOW (ref 3.87–5.11)
RDW: 16.7 % — ABNORMAL HIGH (ref 11.5–15.5)
WBC: 0.6 10*3/uL — CL (ref 4.0–10.5)
nRBC: 0 % (ref 0.0–0.2)

## 2021-01-15 LAB — CBC
HCT: 24.8 % — ABNORMAL LOW (ref 36.0–46.0)
Hemoglobin: 8.2 g/dL — ABNORMAL LOW (ref 12.0–15.0)
MCH: 27.7 pg (ref 26.0–34.0)
MCHC: 33.1 g/dL (ref 30.0–36.0)
MCV: 83.8 fL (ref 80.0–100.0)
Platelets: 53 10*3/uL — ABNORMAL LOW (ref 150–400)
RBC: 2.96 MIL/uL — ABNORMAL LOW (ref 3.87–5.11)
RDW: 16.9 % — ABNORMAL HIGH (ref 11.5–15.5)
WBC: 0.8 10*3/uL — CL (ref 4.0–10.5)
nRBC: 0 % (ref 0.0–0.2)

## 2021-01-15 LAB — SARS CORONAVIRUS 2 (TAT 6-24 HRS): SARS Coronavirus 2: NEGATIVE

## 2021-01-15 MED ORDER — SODIUM CHLORIDE 0.9 % IV SOLN
1.0000 g | INTRAVENOUS | Status: DC
  Administered 2021-01-15 – 2021-01-20 (×6): 1 g via INTRAVENOUS
  Filled 2021-01-15 (×3): qty 1
  Filled 2021-01-15: qty 10
  Filled 2021-01-15: qty 1
  Filled 2021-01-15 (×2): qty 10

## 2021-01-15 MED ORDER — FLUCONAZOLE IN SODIUM CHLORIDE 200-0.9 MG/100ML-% IV SOLN
200.0000 mg | Freq: Every day | INTRAVENOUS | Status: DC
  Administered 2021-01-16 – 2021-01-31 (×16): 200 mg via INTRAVENOUS
  Filled 2021-01-15 (×17): qty 100

## 2021-01-15 MED ORDER — FLUCONAZOLE IN SODIUM CHLORIDE 400-0.9 MG/200ML-% IV SOLN
400.0000 mg | Freq: Once | INTRAVENOUS | Status: AC
  Administered 2021-01-15: 400 mg via INTRAVENOUS
  Filled 2021-01-15: qty 200

## 2021-01-15 NOTE — Progress Notes (Signed)
PROGRESS NOTE  JANIQUA Rodgers  VHQ:[id number] DOB: [birth year] DOA: 01/20/2021 PCP: Elby Showers, MD  Outpatient Specialists: Oncology, Dr. [name] Brief Narrative: Tara Rodgers is a 75 y.o. female with a history of metastatic SCLC undergoing chemotherapy s/p XRT, transfusion-dependent anemia, thrombocytopenia, right breast CA s/p mastectomy 2016 on antiestrogen therapy who has experienced 3 weeks of constant, worsening, severe dysphagia, globus sensation, and odynophagia that has limited per oral intake severely to the point that she isn't able to tolerate any po and not able to take her medications. In the ED she appeared dehydrated with ketones in urine. Tachycardic into 130's, afebrile and normotensive on arrival, with benign abdominal exam, hypokalemic (2.9) with NAGMA, and worse LFT elevations (AST 113, ALT 479, TBili 2.7) than prior. 2L IVF were given, potassium supplemented, and the patient was admitted with GI consultation for EGD. IV fluids rehydration unveiled pancytopenia which precludes endoscopy at this time.   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 - Nystatin, diflucan IV ordered. - GI consulted, recommended empiric infectious Tx and awaiting improvement in neutropenia to consider EGD.  - Attempt clear liquid diet.  Pancytopenia:  - Neutropenic precautions, differential add-on to AM labs. Await oncology recommendation regarding GCSF agent.  - Monitoring hgb, transfuse prn hgb <7g/dl or per oncology recommendations - Hold pharmacologic DVT ppx given degree of thrombocytopenia.   Dehydration, AKI: SCr 0.84 > 0.43 with IV fluids.   - Continue w/LR + KCl due to acidosis and hypokalemia.   Hypokalemia:  - Continue IV supplementation - Give empiric Mg and monitor.  Symptomatic pyuria: Afebrile. -  Check urine culture - Especially in light of neutropenia, will cover with CTX.   Stage IV small cell lung cancer: Seen on PET 1/28 started on etoposide, carboplatin and atezolizumab 11/22/2020, s/p palliative mediastinal XRT 1/31 - 2/15.  - Oncology, Dr. [name] consulted  Solid 1.2 cm hypoechoic lesion in the posterosuperior aspect of the right liver: Nonspecific.  - Could consider contrast-enhanced MRI. Will defer to oncology.  LFT elevation: Multifactorial, improved. RUQ U/S without ductal dilatation - RUQ U/S - Recheck LFTs in AM.   HTN, tachycardia:  - Change metoprolol po to IV  Hypothyroidism:  - Change synthroid to IV for now. Has had good response following TSH.  History of breast CA: On antiestrogen which we will hold while unable to take po.  History of covid-19 infection and s/p 2 doses of vaccine:  - Screening test ordered per protocol, recommended 3rd dose.   DVT prophylaxis: SCDs Code Status: DNR Family Communication: None at bedside Disposition Plan:  Status is: Inpatient  Remains inpatient appropriate because:Ongoing diagnostic testing needed not appropriate for outpatient work up and IV treatments appropriate due to intensity of illness or inability to take PO  Dispo: The patient is from: Home              Anticipated d/c is to: Home              Patient currently is not medically stable to d/c.   Difficult to place patient No  Consultants:   GI, Dr. Cristina Gong  Oncology, Dr. [name]  Procedures:   None  Antimicrobials:  Ceftriaxone   Subjective: Wants to try to take some ice chips. No abd pain. Confirms that there is some discomfort associated with urination  for the past few days or so. No fevers. No vomiting. No bleeding noted.  Objective: Vitals:   01/17/2021 1931 01/15/2021 2340 01/15/21 0458 01/15/21 0818  BP: 132/88 (!) 156/84 (!) 149/86 133/65  Pulse: (!) 111 98 (!) 101 92  Resp: 17 18  18   Temp: 97.9 F (36.6 C) 98.7 F (37.1 C) 98.2 F (36.8  C) 98.4 F (36.9 C)  TempSrc: Oral Oral Oral Oral  SpO2: 97% 100% 100% 100%    Intake/Output Summary (Last 24 hours) at 01/15/2021 1159 Last data filed at 01/15/2021 0820 Gross per 24 hour  Intake 2147 ml  Output 1550 ml  Net 597 ml   Gen: 75 y.o. female in no distress Pulm: Non-labored breathing room air. Clear to auscultation bilaterally.  CV: Regular rate and rhythm. No murmur, rub, or gallop. No JVD, trace pedal edema. GI: Abdomen soft, not tender, non-distended, with normoactive bowel sounds. No organomegaly or masses felt. Ext: Warm, no deformities Skin: No rashes, lesions or ulcers. Port site accessed and c/d/i nontender. Neuro: Alert and oriented. No focal neurological deficits. Psych: Judgement and insight appear normal. Mood & affect appropriate.   Data Reviewed: I have personally reviewed following labs and imaging studies  CBC: Recent Labs  Lab 01/22/2021 1443 01/15/21 0552  WBC 4.5 0.8*  HGB 10.0* 8.2*  HCT 30.3* 24.8*  MCV 83.5 83.8  PLT 89* 53*   Basic Metabolic Panel: Recent Labs  Lab 01/22/2021 1443 01/15/21 0552  NA 136 133*  K 2.9* 3.1*  CL 106 106  CO2 17* 18*  GLUCOSE 113* 89  BUN 17 10  CREATININE 0.84 0.43*  CALCIUM 8.7* 7.8*  MG 1.7  --    GFR: Estimated Creatinine Clearance: 42.7 mL/min (A) (by C-G formula based on SCr of 0.43 mg/dL (L)). Liver Function Tests: Recent Labs  Lab 02/01/2021 1443 01/15/21 0552  AST 113* 74*  ALT 479* 338*  ALKPHOS 103 82  BILITOT 2.7* 2.8*  PROT 6.4* 5.4*  ALBUMIN 3.8 3.1*   Recent Labs  Lab 02/05/2021 1443  LIPASE 45   No results for input(s): AMMONIA in the last 168 hours. Coagulation Profile: No results for input(s): INR, PROTIME in the last 168 hours. Cardiac Enzymes: No results for input(s): CKTOTAL, CKMB, CKMBINDEX, TROPONINI in the last 168 hours. BNP (last 3 results) No results for input(s): PROBNP in the last 8760 hours. HbA1C: No results for input(s): HGBA1C in the last 72  hours. CBG: No results for input(s): GLUCAP in the last 168 hours. Lipid Profile: No results for input(s): CHOL, HDL, LDLCALC, TRIG, CHOLHDL, LDLDIRECT in the last 72 hours. Thyroid Function Tests: No results for input(s): TSH, T4TOTAL, FREET4, T3FREE, THYROIDAB in the last 72 hours. Anemia Panel: No results for input(s): VITAMINB12, FOLATE, FERRITIN, TIBC, IRON, RETICCTPCT in the last 72 hours. Urine analysis:    Component Value Date/Time   COLORURINE STRAW (A) 01/13/2021 1834   APPEARANCEUR CLEAR 01/13/2021 1834   LABSPEC 1.005 01/19/2021 1834   PHURINE 7.0 02/04/2021 1834   GLUCOSEU NEGATIVE 02/01/2021 1834   HGBUR MODERATE (A) 01/27/2021 1834   BILIRUBINUR NEGATIVE 01/20/2021 1834   BILIRUBINUR NEG 09/05/2020 1215   KETONESUR 5 (A) 01/28/2021 1834   PROTEINUR NEGATIVE 01/12/2021 1834   UROBILINOGEN 0.2 09/05/2020 1215   NITRITE NEGATIVE 01/31/2021 1834   LEUKOCYTESUR NEGATIVE 01/16/2021 1834   Recent Results (from the past 240 hour(s))  SARS CORONAVIRUS 2 (TAT 6-24 HRS) Nasopharyngeal Nasopharyngeal Swab     Status: None   Collection  Time: 02/01/2021  6:34 PM   Specimen: Nasopharyngeal Swab  Result Value Ref Range Status   SARS Coronavirus 2 NEGATIVE NEGATIVE Final    Comment: (NOTE) SARS-CoV-2 target nucleic acids are NOT DETECTED.  The SARS-CoV-2 RNA is generally detectable in upper and lower respiratory specimens during the acute phase of infection. Negative results do not preclude SARS-CoV-2 infection, do not rule out co-infections with other pathogens, and should not be used as the sole basis for treatment or other patient management decisions. Negative results must be combined with clinical observations, patient history, and epidemiological information. The expected result is Negative.  Fact Sheet for Patients: SugarRoll.be  Fact Sheet for Healthcare Providers: https://www.woods-mathews.com/  This test is not yet  approved or cleared by the [place] FDA and  has been authorized for detection and/or diagnosis of SARS-CoV-2 by FDA under an Emergency Use Authorization (EUA). This EUA will remain  in effect (meaning this test can be used) for the duration of the COVID-19 declaration under Se ction 564(b)(1) of the Act, 21 U.S.C. section 360bbb-3(b)(1), unless the authorization is terminated or revoked sooner.  Performed at [facility], [place] [address] [place],  [address]       Radiology Studies: DG Chest 2 View  Result Date: 02/09/2021 CLINICAL DATA:  Dysphagia and chest pain with nausea and gagging as well as vomiting 3 weeks. History of metastatic small cell lung cancer with last chemotherapy 2 weeks ago. EXAM: CHEST - 2 VIEW COMPARISON:  11/13/2020 and CT 01/01/2021 FINDINGS: Right IJ Port-A-Cath has tip over the SVC. Lungs are adequately inflated without focal airspace consolidation or effusion. Cardiomediastinal silhouette is normal. Stable moderate sclerotic compression fractures over the thoracic and lumbar spine compared to 01/01/2021, although these are new compared to 10/26/2020. IMPRESSION: 1.  No acute cardiopulmonary disease. 2. Several moderate sclerotic compression fractures of the thoracic and lumbar spine new since January 2022 but unchanged from 01/01/2021 likely metastatic in origin. Electronically Signed   By: Marin Olp M.D.   On: 02/02/2021 14:58   US Abdomen Limited RUQ (LIVER/GB)  Result Date: 01/15/2021 CLINICAL DATA:  Elevated LFTs EXAM: ULTRASOUND ABDOMEN LIMITED RIGHT UPPER QUADRANT COMPARISON:  None. FINDINGS: Gallbladder: No gallstones or wall thickening visualized. No sonographic Murphy sign noted by sonographer. Common bile duct: Diameter: Normal at 1.5 mm Liver: Small subtly hypoechoic solid lesion in the posterosuperior aspect of the right liver measures 1.2 x 1.1 cm. Hepatic parenchymal echogenicity and echotexture within normal limits. Portal vein is  patent on color Doppler imaging with normal direction of blood flow towards the liver. Other: None. IMPRESSION: 1. Small nonspecific solid 1.2 cm hypoechoic lesion in the posterosuperior aspect of the right liver. Differential considerations are broad and include both malignant and benign neoplastic processes. Recommend further evaluation with gadolinium-enhanced MRI of the abdomen. 2. Otherwise, unremarkable right upper quadrant ultrasound. Electronically Signed   By: Jacqulynn Cadet M.D.   On: 01/15/2021 06:37    Scheduled Meds: . Chlorhexidine Gluconate Cloth  6 each Topical Daily  . feeding supplement  1 Container Oral TID BM  . fentaNYL (SUBLIMAZE) injection  12.5 mcg Intravenous Once  . levothyroxine  50 mcg Intravenous Daily  . mouth rinse  15 mL Mouth Rinse BID  . metoprolol tartrate  5 mg Intravenous Q6H  . nystatin  5 mL Oral QID  . pantoprazole (PROTONIX) IV  40 mg Intravenous Q24H  . sodium chloride flush  10-40 mL Intracatheter Q12H  . sodium chloride flush  3 mL Intravenous Q12H   Continuous Infusions: . [START ON 01/16/2021] fluconazole (DIFLUCAN) IV    . fluconazole (DIFLUCAN) IV 400 mg (01/15/21 1126)  . lactated ringers with kcl 100 mL/hr at 01/15/21 0641     LOS: 0 days   Time spent: 35 minutes.  Patrecia Pour, MD [facility] [email or web address] 01/15/2021, 11:59 AM

## 2021-01-15 NOTE — Consult Note (Signed)
Referring Provider: [facility] Primary Care Physician:  Elby Showers, MD Primary Gastroenterologist: None (unassigned)  Reason for Consultation: Dysphagia and odynophagia  HPI: Tara Rodgers is a 75 y.o. 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  She has also had some minimal hematemesis.    She is not in pain as long as she does not swallow.  There is a history of previous reflux symptoms, quite well controlled by outpatient use of Nexium 40 mg daily.  No antecedent history of significant dysphagia symptoms.  Of note, since admission the patient's white count has dropped from 4500 to 800 (differential not performed), with pancytopenia, hemoglobin dropping to 8.2 and platelets dropping to 53,000.  No dyspnea.  No urinary symptoms.  Having a bowel movement only once every 5 days or so, consistent with diminished oral intake but not difficult or painful.  No focal neurologic symptoms.  Minimal lower extremity edema.  The patient is essentially bed confined, able to take just a couple of steps while holding onto things.   Past Medical History:  Diagnosis Date  . Anxiety   . Asthma    does not take any medications, hx of inhaler use  . Breast cancer [facility]) 1995/2016   right sided breast cancer in 08/1994  . Bronchitis    hx of  . GERD (gastroesophageal reflux disease)   . History of hiatal hernia   . Hypertension   . Hyperthyroidism   . Lung cancer ([place]) 11/02/2020    Past Surgical History:  Procedure Laterality Date  . BREAST BIOPSY Right 2016   malignant  . BREAST EXCISIONAL BIOPSY Left 1996   benign  . Carpel Tunnel  Bilateral   . INCONTINENCE SURGERY     with mesh insertion about 7 years ago   . IR IMAGING GUIDED PORT INSERTION  11/29/2020  . LATISSIMUS FLAP TO BREAST Right 03/23/2015   Procedure: LATISSIMUS FLAP TO BREAST WITH PLACEMENT OF IMPLANT FOR BREAST RECONSTRUCTION;  Surgeon: [name], MD;  Location: [place];  Service: Plastics;  Laterality: Right;  . MASTECTOMY Right 03/23/2015  . RECONSTRUCTION BREAST W/ LATISSIMUS DORSI FLAP Right 03/23/2015  . TONSILLECTOMY     as a child  . TOTAL KNEE ARTHROPLASTY Right 03/13/2018   Procedure: RIGHT TOTAL KNEE ARTHROPLASTY;  Surgeon: Sydnee Cabal, MD;  Location: [facility];  Service: Orthopedics;  Laterality: Right;  Adductor Block  . TOTAL MASTECTOMY Right 03/23/2015   Procedure: RIGHT TOTAL MASTECTOMY;  Surgeon: [name], MD;  Location: [place];  Service: General;  Laterality: Right;    Prior to Admission medications   Medication Sig Start Date End Date Taking? Authorizing Provider  ALPRAZolam Duanne Moron) 0.5 MG tablet Take 1 tablet by mouth twice daily as needed Patient taking differently: Take 0.5 mg by mouth 2 (two) times daily as needed for anxiety. 12/24/20  Yes [name], [name] [name], MD  esomeprazole (NEXIUM) 40 MG capsule One po daily at bedtime Patient taking differently: Take by mouth See admin instructions. Open contents of capsule and sprinkle on liquid - take by mouth daily at bedtime 08/24/20  Yes [name], [name] [name], MD  fentaNYL (DURAGESIC) 12 MCG/HR PLACE 1 PATCH ONTO THE SKIN EVERY 3 DAYS. Patient taking differently: Place 1 patch  onto the skin every 3 (three) days. 01/01/21 06/30/21 Yes [name], MD  levothyroxine (SYNTHROID) 125 MCG tablet Take 1 tablet (125 mcg total) by mouth daily. Patient taking differently: Take 125 mcg by mouth daily before breakfast. 12/22/20  Yes [name], [name] [name], MD  lidocaine-prilocaine (EMLA) cream Apply 1 application topically as needed. Patient taking differently: Apply 1 application topically as needed (prior to port access). 12/11/20  Yes [name], MD  magic mouthwash w/lidocaine SOLN Swish and  spit 10 ml by mouth every 6 hours as needed Patient taking differently: Take 10 mLs by mouth See admin instructions. Swish and spit 10 ml by mouth every 6 hours as needed for mouth pain 01/11/21  Yes [name], MD  megestrol (MEGACE ES) 625 MG/5ML suspension Take 5 mLs (625 mg total) by mouth daily. 12/18/20  Yes [name], MD  ondansetron (ZOFRAN-ODT) 8 MG disintegrating tablet Take 1 tablet (8 mg total) by mouth every 8 (eight) hours as needed for nausea or vomiting. 01/05/21  Yes [name], MD  prochlorperazine (COMPAZINE) 10 MG tablet Take 1 tablet (10 mg total) by mouth every 6 (six) hours as needed (Nausea or vomiting). 11/16/20  Yes [name], MD  senna-docusate (SENOKOT-S) 8.6-50 MG tablet Take 1 tablet by mouth daily. Patient taking differently: Take 1 tablet by mouth daily as needed. 11/28/20  Yes Carmin Muskrat, MD  allopurinol (ZYLOPRIM) 300 MG tablet Take 1 tablet (300 mg total) by mouth daily. Patient not taking: No sig reported 11/17/20   [name], MD  cyclobenzaprine (FLEXERIL) 10 MG tablet Take 1 tablet (10 mg total) by mouth 3 (three) times daily as needed for muscle spasms. Patient not taking: No sig reported 10/18/19   Elby Showers, MD  HYDROcodone-acetaminophen (HYCET) 7.5-325 mg/15 ml solution Take 10-15 mLs by mouth every 4 (four) hours as needed for moderate pain or severe pain. Patient not taking: No sig reported 12/18/20   [name], MD  letrozole [facility]) 2.5 MG tablet Take 1 tablet (2.5 mg total) by mouth daily. Patient not taking: Reported on 01/21/2021 03/30/20   [name], MD  lidocaine (LIDODERM) 5 % Place 1 patch onto the skin daily. Remove & Discard patch within 12 hours or as directed by MD Patient not taking: No sig reported 11/27/20   [name], MD  metoprolol succinate (TOPROL-XL) 50 MG 24 hr tablet Take 1 tablet by mouth twice daily Patient not taking: No sig reported 11/20/20   Lorretta Harp, MD  ondansetron (ZOFRAN) 8 MG tablet Take 1 tablet (8 mg total) by mouth 2  (two) times daily as needed for refractory nausea / vomiting. Start on day 3 after carboplatin chemo. Patient not taking: No sig reported 11/16/20   [name], MD  potassium chloride (MICRO-K) 10 MEQ CR capsule Take 1 capsule (10 mEq total) by mouth 2 (two) times daily. Patient not taking: No sig reported 12/14/20   [name], MD  potassium chloride SA (KLOR-CON) 20 MEQ tablet Take 1 tablet (20 mEq total) by mouth 2 (two) times daily. Patient not taking: No sig reported 12/11/20   [name], MD  verapamil (CALAN-SR) 240 MG CR tablet Take 1 tablet by mouth once daily Patient not taking: No sig reported 09/11/20   Elby Showers, MD    Current Facility-Administered Medications  Medication Dose Route Frequency Provider Last Rate Last Admin  . acetaminophen (TYLENOL) tablet 650 mg  650 mg Oral Q6H PRN Patrecia Pour, MD      .  Chlorhexidine Gluconate Cloth 2 % PADS 6 each  6 each Topical Daily Vance Gather B, MD      . feeding supplement (BOOST / RESOURCE BREEZE) liquid 1 Container  1 Container Oral TID BM Vance Gather B, MD      . fentaNYL (SUBLIMAZE) injection 12.5 mcg  12.5 mcg Intravenous Once Vance Gather B, MD      . lactated ringers 1,000 mL with potassium chloride 40 mEq infusion   Intravenous Continuous Patrecia Pour, MD 100 mL/hr at 01/15/21 0641 New Bag at 01/15/21 0641  . levothyroxine (SYNTHROID, LEVOTHROID) injection 50 mcg  50 mcg Intravenous Daily Patrecia Pour, MD   50 mcg at 01/15/21 0545  . LORazepam (ATIVAN) 2 MG/ML concentrated solution 0.5 mg  0.5 mg Oral Q6H PRN Patrecia Pour, MD   0.5 mg at 01/15/21 0108  . MEDLINE mouth rinse  15 mL Mouth Rinse BID Vance Gather B, MD      . metoprolol tartrate (LOPRESSOR) injection 5 mg  5 mg Intravenous Q6H Patrecia Pour, MD   5 mg at 01/15/21 0545  . nystatin (MYCOSTATIN) 100000 UNIT/ML suspension 500,000 Units  5 mL Oral QID Patrecia Pour, MD   500,000 Units at 02/06/2021 2136  . ondansetron (ZOFRAN) injection 4 mg  4 mg Intravenous Q6H PRN Patrecia Pour, MD      . pantoprazole (PROTONIX) injection 40 mg  40 mg Intravenous Q24H Patrecia Pour, MD   40 mg at 01/20/2021 2136  . sodium chloride flush (NS) 0.9 % injection 10-40 mL  10-40 mL Intracatheter Q12H Vance Gather B, MD      . sodium chloride flush (NS) 0.9 % injection 10-40 mL  10-40 mL Intracatheter PRN Vance Gather B, MD      . sodium chloride flush (NS) 0.9 % injection 3 mL  3 mL Intravenous Q12H Patrecia Pour, MD   3 mL at 01/27/2021 2136    Allergies as of 01/27/2021 - Review Complete 01/28/2021  Allergen Reaction Noted  . Fosamax [alendronate sodium] Other (See Comments) 05/04/2015    Family History  Problem Relation Age of Onset  . COPD Father   . Heart disease Father     Social History   Socioeconomic History  . Marital status: Married    Spouse name: Not on file  . Number of children: 2  . Years of education: Not on file  . Highest education level: Not on file  Occupational History  . Not on file  Tobacco Use  . Smoking status: Never Smoker  . Smokeless tobacco: Never Used  Substance and Sexual Activity  . Alcohol use: Yes    Comment: occasional  . Drug use: No  . Sexual activity: Not on file  Other Topics Concern  . Not on file  Social History Narrative  . Not on file   Social Determinants of Health   Financial Resource Strain: Not on file  Food Insecurity: Not on file  Transportation Needs: Not on file  Physical Activity: Not on file  Stress: Not on file  Social Connections: Not on file  Intimate Partner Violence: Not on file    Review of Systems: See history of present illness  Physical Exam: Vital signs in last 24 hours: Temp:  [97.9 F (36.6 C)-98.7 F (37.1 C)] 98.4 F (36.9 C) (04/04 0818) Pulse Rate:  [42-133] 92 (04/04 0818) Resp:  [17-19] 18 (04/04 0818) BP: (112-156)/(60-88) 133/65 (04/04 0818) SpO2:  [77 %-100 %]  100 % (04/04 0818) Last BM Date: 01/13/21 General:   Alert, not overtly cachectic but somewhat chronically  ill-appearing, pleasant and cooperative in NAD Head: Alopecia Eyes:  Sclera clear, questionable minimal scleral icterus Mouth:   No ulcerations or lesions.  Oropharynx pink & moist.  Specifically, no evidence of thrush. Lungs:  Clear throughout to auscultation.   No wheezes, crackles, or rhonchi. No evident respiratory distress. Heart:   Regular rate and rhythm; no murmurs, clicks, rubs,  or gallops. Abdomen:  Soft, nontender,nondistended.  Msk:   Symmetrical without gross deformities. Extremities:   Trace tibial edema. Neurologic:  Alert and coherent;  grossly normal neurologically. Skin:  Intact without significant lesions or rashes. Psych:   Alert and cooperative. Normal mood and affect.  Intake/Output from previous day: 04/03 0701 - 04/04 0700 In: 2147 [IV Piggyback:2147] Out: 1200 [Urine:1200] Intake/Output this shift: Total I/O In: -  Out: 350 [Urine:350]  Lab Results: Recent Labs    01/17/2021 1443 01/15/21 0552  WBC 4.5 0.8*  HGB 10.0* 8.2*  HCT 30.3* 24.8*  PLT 89* 53*   BMET Recent Labs    01/19/2021 1443 01/15/21 0552  NA 136 133*  K 2.9* 3.1*  CL 106 106  CO2 17* 18*  GLUCOSE 113* 89  BUN 17 10  CREATININE 0.84 0.43*  CALCIUM 8.7* 7.8*   LFT Recent Labs    01/15/21 0552  PROT 5.4*  ALBUMIN 3.1*  AST 74*  ALT 338*  ALKPHOS 82  BILITOT 2.8*   PT/INR No results for input(s): LABPROT, INR in the last 72 hours.  Studies/Results: DG Chest 2 View  Result Date: 01/15/2021 CLINICAL DATA:  Dysphagia and chest pain with nausea and gagging as well as vomiting 3 weeks. History of metastatic small cell lung cancer with last chemotherapy 2 weeks ago. EXAM: CHEST - 2 VIEW COMPARISON:  11/13/2020 and CT 01/01/2021 FINDINGS: Right IJ Port-A-Cath has tip over the SVC. Lungs are adequately inflated without focal airspace consolidation or effusion. Cardiomediastinal silhouette is normal. Stable moderate sclerotic compression fractures over the thoracic and lumbar  spine compared to 01/01/2021, although these are new compared to 10/26/2020. IMPRESSION: 1.  No acute cardiopulmonary disease. 2. Several moderate sclerotic compression fractures of the thoracic and lumbar spine new since January 2022 but unchanged from 01/01/2021 likely metastatic in origin. Electronically Signed   By: Marin Olp M.D.   On: 02/06/2021 14:58   US Abdomen Limited RUQ (LIVER/GB)  Result Date: 01/15/2021 CLINICAL DATA:  Elevated LFTs EXAM: ULTRASOUND ABDOMEN LIMITED RIGHT UPPER QUADRANT COMPARISON:  None. FINDINGS: Gallbladder: No gallstones or wall thickening visualized. No sonographic Murphy sign noted by sonographer. Common bile duct: Diameter: Normal at 1.5 mm Liver: Small subtly hypoechoic solid lesion in the posterosuperior aspect of the right liver measures 1.2 x 1.1 cm. Hepatic parenchymal echogenicity and echotexture within normal limits. Portal vein is patent on color Doppler imaging with normal direction of blood flow towards the liver. Other: None. IMPRESSION: 1. Small nonspecific solid 1.2 cm hypoechoic lesion in the posterosuperior aspect of the right liver. Differential considerations are broad and include both malignant and benign neoplastic processes. Recommend further evaluation with gadolinium-enhanced MRI of the abdomen. 2. Otherwise, unremarkable right upper quadrant ultrasound. Electronically Signed   By: Jacqulynn Cadet M.D.   On: 01/15/2021 06:37    Impression: 1.  Severe odynophagia and dysphagia, minimal hematemesis.  I think the most likely scenario here is infectious esophagitis associated with neutropenia.  I think we are too far out  to have acute radiation esophagitis. 2.  Pancytopenia with severe neutropenia 3.  Lung cancer, s/p radiation therapy and now with current chemotherapy  Plan: 1.  Not a good candidate for endoscopy until her counts are better 2.  Consider neutropenic precautions, and possibly empiric fluconazole 3.  If the patient's counts do  not come up in the next day or 2 to allow endoscopic evaluation, we might consider trying a barium swallow to just get a rough idea of what is going on with her esophagus.  Above discussed with Dr. [name] via secure chat.   LOS: 0 days   Youlanda Mighty Mikeya Tomasetti  01/15/2021, 9:08 AM   Pager [phone] If no answer or after 5 PM call [phone]

## 2021-01-15 NOTE — Progress Notes (Signed)
Pharmacy Antibiotic Note  Tara Rodgers is a 75 y.o. female admitted on 02/10/2021 with possible esophageal candidiasis.  Pharmacy has been consulted for fluconazol dosing.   Pt is  neutropenic patient with possible  Candidal esophagitis. Gi unable to currently do perform EGD due to low blood counts .    Plan: Fluconazole 400 mg IV x1, then 200 mg IV daily   Monitor for completion of CBC improvements, possibility of EGD      Temp (24hrs), Avg:98.3 F (36.8 C), Min:97.9 F (36.6 C), Max:98.7 F (37.1 C)  Recent Labs  Lab 02/10/2021 1443 01/15/21 0552  WBC 4.5 0.8*  CREATININE 0.84 0.43*    Estimated Creatinine Clearance: 42.7 mL/min (A) (by C-G formula based on SCr of 0.43 mg/dL (L)).    Allergies  Allergen Reactions  . Fosamax [Alendronate Sodium] Other (See Comments)    Aching all over     Thank you for allowing pharmacy to be a part of this patient's care.   Royetta Asal, PharmD, BCPS 01/15/2021 10:31 AM

## 2021-01-15 NOTE — Progress Notes (Addendum)
HEMATOLOGY-ONCOLOGY PROGRESS NOTE  SUBJECTIVE: [name] is followed by our office for extensive stage small cell lung cancer.  She has been receiving chemotherapy with carboplatin, etoposide, and atezolizumab.  Last cycle started on 01/08/2021.  She received Neulasta with her chemotherapy.  The patient was admitted to the hospital due to dehydration and severe dysphagia.  She has been seen by GI who is considering endoscopy if her blood counts improve.  They have also recommended fluconazole empirically.  I saw the patient this morning and her daughter was at the bedside.  She reports that she is feeling somewhat better.  She still has significant difficulty swallowing.  She can take in very small amounts of fluid but gets choked on solids and large amounts of fluid.  She denies having any fevers or chills.  She offers no other complaints today.  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 [facility]) Staging form: Breast, AJCC 7th Edition - Clinical: Stage IIA (T2, N0, M0) - Signed by [name], MD on 02/08/2015 Laterality: Right Estrogen receptor status: Positive Progesterone receptor status: Positive HER2 status: Negative - Pathologic stage from 03/23/2015: Stage Unknown (T2, NX, cM0) - Unsigned  Small cell lung cancer ([place]) Staging form: Lung, AJCC 8th Edition - Clinical stage from 11/13/2020: Stage IV (cTX, cN3, cM1) - Signed by [name], MD on 11/16/2020 Stage prefix: Initial diagnosis     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) and adjuvant radiation ([name]).   12/13/2014 Mammogram   Right breast: possible asymmetry warranting further evaluation with spot compression views and possibly  ultrasound   12/16/2014 Breast US   Right breast: no definitive abnormality within the superior aspect of the right breast to correspond with the mammographic finding.   01/10/2015 Breast MRI   Right breast: irregular rim enhancing mass within the superior right breast at 11:30 measuring 2.5 x 2.4 x 2.4 cm. No abnormal enhancement is identified within the skin of the superior right breast in the area of concern on mammogram.   01/12/2015 Breast US   Second look diagnostic mammogram and ultrasound showed a 2.3 cm mass in the upper midline right breast. No axillary adenopathy.   01/12/2015 Initial Biopsy   Right breast needle core bx (upper midline): Invasive ductal carcinoma, grade 2, ER+ (100%), PR+ (77%), HER2/neu negative (ratio 1.15), Ki67 20%    01/30/2015 Procedure   Breast High/Moderate Risk panel ([facility]) reveals no clinically significant variant at ATM, BRCA1, BRCA2, CDH1, CHEK2, PALB2, PTEN, STK11, and TP53.   02/08/2015 Clinical Stage   Stage IIA (T2 N0)   03/23/2015 Definitive Surgery   Right mastectomy ([name]): invasive adenocarcinoma, grade 3, 2.9 cm, HER2/neu repeated, negative (ratio 1.23)   03/23/2015 Oncotype testing   Score: 8 (6% ROR). No chemotherapy [name]).   03/23/2015 Pathologic Stage   Stage IIA: pT2 pNx   05/04/2015 -  Anti-estrogen oral therapy   Letrozole 2.18m daily. Planned duration of therapy at least 5 years.    07/25/2015 Survivorship   Survivorship visit completed and copy of care plan provided to patient.   12/15/2015 Mammogram   IMPRESSION: No mammographic evidence of malignancy. A result letter of this screening mammogram will be mailed directly to the patient.   12/23/2016 Mammogram   IMPRESSION:  No mammographic evidence of malignancy. A result letter of this screening mammogram will be mailed directly to the patient.    12/30/2017 Mammogram   12/30/2017 Mammogram IMPRESSION: No mammographic evidence of malignancy. A result letter of  this screening mammogram will be mailed directly to the patient.   03/27/2020 Mammogram   IMPRESSION: No mammographic evidence of malignancy. A result letter of this screening mammogram will be mailed directly to the patient.   10/26/2020 Imaging   CT chest IMPRESSION: 1. Bulky mediastinal, thoracic inlet, right supraclavicular, and left axillary lymphadenopathy consistent with metastatic disease. Associated numerous bilateral pulmonary nodules and masses, most prominently in the right middle lobe with dominant 4.1 cm peripheral mass lesion. 2. 16 mm rim enhancing lesion posterior right liver consistent with metastatic involvement. 3. 13 mm hypoenhancing lesion in the tail of pancreas. Primary neoplasm or metastatic involvement could have this appearance. 4. Multiple nodules in the upper abdomen concerning for peritoneal/mesenteric metastatic disease. 5. Small right and tiny left pleural effusions with bibasilar atelectasis. 6. Aortic Atherosclerosis (ICD10-I70.0).   11/10/2020 PET scan   IMPRESSION: 1. Extensive hypermetabolic multi organ metastatic disease. 2. Bulky intensely hypermetabolic RIGHT supraclavicular, mediastinal and LEFT axillary adenopathy. 3. Hypermetabolic pulmonary mass in the RIGHT upper lobe with additional hypermetabolic nodules. 4. Hypermetabolic solitary hepatic metastasis. 5. Hypermetabolic lesion within the pancreas is also favored metastatic lesion. 6. Multifocal hypermetabolic skeletal metastasis. 7. Bilateral pleural effusions.   11/13/2020 Procedure   Thoracentesis  IMPRESSION: Successful ultrasound guided right thoracentesis yielding 200 mL of pleural fluid.   Small cell lung cancer ([place])  11/13/2020 Pathology Results   Right supraclavicular LN Biopsy  FINAL MICROSCOPIC DIAGNOSIS:   A. LYMPH NODE, RIGHT SUPRACLAVICULAR, NEEDLE CORE BIOPSY:  - Most consistent with small cell carcinoma.   COMMENT:   Tumor is limited in quantity, and  partially necrotic.  TTF-1 and  Synaptophysin are positive.  Ki-67 proliferation index reaches 80-90%.  GATA-3, ER and p40 are negative.  The morphologic and immunophenotypic  characteristics are most suggestive of small cell carcinoma.  TTF-1  positive staining is compatible with origin from the lung; however, it  does not exclude origin from other entities.  Please note that distinct  nodal tissue is not identified in the submitted material.  Results  reported to Dr. [name] on 11/16/2020.  Dr. Melina Copa reviewed the case   11/13/2020 Cancer Staging   Staging form: Lung, AJCC 8th Edition - Clinical stage from 11/13/2020: Stage IV (cTX, cN3, cM1) - Signed by [name], MD on 11/16/2020 Stage prefix: Initial diagnosis   11/13/2020 - 11/28/2020 Radiation Therapy   palliative radiation to [facility] with Dr Lisbeth Renshaw 11/13/20-11/28/20   11/16/2020 Initial Diagnosis   Small cell lung cancer ([place])   11/22/2020 -  Chemotherapy   First line etoposide D1-3, carboplatin and atezolizumab D1 q3weeks beginning 11/22/20.        11/29/2020 Procedure   PAC placed on 11/29/20   01/01/2021 Imaging   IMPRESSION: 1. Significant interval decrease in size of the right middle lobe lung mass and significant interval decrease in size of the mediastinal and right hilar lymph nodes. 2. Resolution of right supraclavicular and left axillary adenopathy. 3. Stable miliary pattern of pulmonary nodules. No new or progressive findings. 4. Stable diffuse sclerotic metastatic bone disease. No findings for progression. 5. Interval decrease in size of the pancreatic mass and adjacent lymph node. 6. Slightly smaller segment 7 liver lesion. No new or progressive findings. 7. Emphysema and aortic atherosclerosis.   Aortic  Atherosclerosis (ICD10-I70.0) and Emphysema (ICD10-J43.9).        REVIEW OF SYSTEMS:   Constitutional: Denies fevers, chills Eyes: Denies blurriness of vision Ears, nose, mouth, throat, and face: Denies  mucositis or sore throat Respiratory: Denies cough, dyspnea or wheezes Cardiovascular: Denies palpitation, chest discomfort Gastrointestinal: Reports severe dysphagia Skin: Denies abnormal skin rashes Lymphatics: Denies new lymphadenopathy or easy bruising Neurological:Denies numbness, tingling or new weaknesses Behavioral/Psych: Mood is stable, no new changes  Extremities: No lower extremity edema All other systems were reviewed with the patient and are negative.  I have reviewed the past medical history, past surgical history, social history and family history with the patient and they are unchanged from previous note.   PHYSICAL EXAMINATION: ECOG PERFORMANCE STATUS: 3 - Symptomatic, >50% confined to bed  Vitals:   01/15/21 0458 01/15/21 0818  BP: (!) 149/86 133/65  Pulse: (!) 101 92  Resp:  18  Temp: 98.2 F (36.8 C) 98.4 F (36.9 C)  SpO2: 100% 100%   There were no vitals filed for this visit.  Intake/Output from previous day: 04/03 0701 - 04/04 0700 In: 2147 [IV Piggyback:2147] Out: 1200 [Urine:1200]  GENERAL: Chronically ill-appearing female, no distress SKIN: skin color, texture, turgor are normal, no rashes or significant lesions EYES: normal, Conjunctiva are pink and non-injected, sclera clear OROPHARYNX: Dry oral mucosa, White coating on tongue LUNGS: clear to auscultation and percussion with normal breathing effort HEART: regular rate & rhythm and no murmurs and no lower extremity edema ABDOMEN:abdomen soft, non-tender and normal bowel sounds NEURO: alert & oriented x 3 with fluent speech, no focal motor/sensory deficits  LABORATORY DATA:  I have reviewed the data as listed CMP Latest Ref Rng & Units 01/15/2021 01/20/2021 01/08/2021  Glucose 70 - 99 mg/dL 89 113(H) 100(H)  BUN 8 - 23 mg/dL _0 Creatinine 0.44 - 1.00 mg/dL 0.43(L) 0.84 0.95  Sodium 135 - 145 mmol/L 133(L) 136 138  Potassium 3.5 - 5.1 mmol/L 3.1(L) 2.9(L) 3.2(L)  Chloride 98 - 111 mmol/L  106 106 103  CO2 22 - 32 mmol/L 18(L) 17(L) 20(L)  Calcium 8.9 - 10.3 mg/dL 7.8(L) 8.7(L) 8.7(L)  Total Protein 6.5 - 8.1 g/dL 5.4(L) 6.4(L) 6.1(L)  Total Bilirubin 0.3 - 1.2 mg/dL 2.8(H) 2.7(H) 0.8  Alkaline Phos 38 - 126 U/L 82 103 124  AST 15 - 41 U/L 74(H) 113(H) 71(H)  ALT 0 - 44 U/L 338(H) 479(H) 178(H)    Lab Results  Component Value Date   WBC 0.8 (LL) 01/15/2021   HGB 8.2 (L) 01/15/2021   HCT 24.8 (L) 01/15/2021   MCV 83.8 01/15/2021   PLT 53 (L) 01/15/2021   NEUTROABS 6.2 01/08/2021    DG Chest 2 View  Result Date: 02/07/2021 CLINICAL DATA:  Dysphagia and chest pain with nausea and gagging as well as vomiting 3 weeks. History of metastatic small cell lung cancer with last chemotherapy 2 weeks ago. EXAM: CHEST - 2 VIEW COMPARISON:  11/13/2020 and CT 01/01/2021 FINDINGS: Right IJ Port-A-Cath has tip over the SVC. Lungs are adequately inflated without focal airspace consolidation or effusion. Cardiomediastinal silhouette is normal. Stable moderate sclerotic compression fractures over the thoracic and lumbar spine compared to 01/01/2021, although these are new compared to 10/26/2020. IMPRESSION: 1.  No acute cardiopulmonary disease. 2. Several moderate sclerotic compression fractures of the thoracic and lumbar spine new since January 2022 but unchanged from 01/01/2021 likely metastatic in origin. Electronically Signed   By: Marin Olp M.D.   On:  02/03/2021 14:58   CT CHEST ABDOMEN PELVIS W CONTRAST  Result Date: 01/02/2021 CLINICAL DATA:  Small cell lung cancer. History of chemotherapy and radiation therapy. EXAM: CT CHEST, ABDOMEN, AND PELVIS WITH CONTRAST TECHNIQUE: Multidetector CT imaging of the chest, abdomen and pelvis was performed following the standard protocol during bolus administration of intravenous contrast. CONTRAST:  110m OMNIPAQUE IOHEXOL 300 MG/ML  SOLN COMPARISON:  CT abdomen/pelvis 11/28/2020 and PET-CT 11/10/2020 FINDINGS: CT CHEST FINDINGS Cardiovascular: The  heart is normal in size. No pericardial effusion. Stable tortuosity and calcification of the thoracic aorta and stable scattered coronary artery calcifications. The right IJ Port-A-Cath is in good position without complicating features. Mediastinum/Nodes: Much improved mediastinal and right hilar adenopathy. Anterior mediastinal nodal mass measures 20 x 12 mm on image 23/2. This previously measured 4.1 x 3.5 cm. Subcarinal node measures 9.5 mm on image 27/2. This previously measured 20 mm. On the prior PET-CT there is a large right supraclavicular nodal mass. I do not see any residual measurable lymphadenopathy. The hypermetabolic left axillary adenopathy seen on the prior PET-CT has resolved. The esophagus is grossly normal. Lungs/Pleura: The peripheral right middle lobe lung mass is much smaller. It measures approximately 4.2 x 2.0 cm on the prior CT scan and now measures approximately 2.3 x 1.0 cm. Persistent miliary pattern of pulmonary nodules throughout both lungs. No new lung lesions. No pleural effusions. Musculoskeletal: Stable appearing diffuse sclerotic T7 and T10 pathologic compression fractures are again noted. Do not see any obvious new or progressive metastatic bone disease. Metastatic disease CT ABDOMEN PELVIS FINDINGS Hepatobiliary: 1.7 x 1.4 cm segment 7 liver lesion previously measured approximately 2.3 x 1.9 cm. No new hepatic metastatic lesions. The gallbladder is unremarkable.  No common bile duct dilatation. Pancreas: The 17 mm pancreatic mass in the body tail junction region now measures approximately 7 mm. There was an adjacent lymph node which measured measured 9 mm and this is no longer identified. Spleen: Normal size.  No focal lesions. Adrenals/Urinary Tract: The adrenal glands and kidneys are unremarkable. No evidence of adrenal gland metastatic disease. The bladder is unremarkable. Stomach/Bowel: The stomach, duodenum, small bowel and colon are unremarkable. Vascular/Lymphatic: Stable  atherosclerotic calcifications involving the aorta and branch vessels. No aneurysm or dissection. The major venous structures are patent. No mesenteric or retroperitoneal mass or lymphadenopathy. Reproductive: The uterus and ovaries are unremarkable. Other: No pelvic mass or free pelvic fluid collections. No inguinal mass or adenopathy. Musculoskeletal: Stable severe degenerative changes involving the left hip. Stable scattered sclerotic metastatic bone disease without findings for progression. IMPRESSION: 1. Significant interval decrease in size of the right middle lobe lung mass and significant interval decrease in size of the mediastinal and right hilar lymph nodes. 2. Resolution of right supraclavicular and left axillary adenopathy. 3. Stable miliary pattern of pulmonary nodules. No new or progressive findings. 4. Stable diffuse sclerotic metastatic bone disease. No findings for progression. 5. Interval decrease in size of the pancreatic mass and adjacent lymph node. 6. Slightly smaller segment 7 liver lesion. No new or progressive findings. 7. Emphysema and aortic atherosclerosis. Aortic Atherosclerosis (ICD10-I70.0) and Emphysema (ICD10-J43.9). Electronically Signed   By: PMarijo SanesM.D.   On: 01/02/2021 12:28   [place]Abdomen Limited RUQ (LIVER/GB)  Result Date: 01/15/2021 CLINICAL DATA:  Elevated LFTs EXAM: ULTRASOUND ABDOMEN LIMITED RIGHT UPPER QUADRANT COMPARISON:  None. FINDINGS: Gallbladder: No gallstones or wall thickening visualized. No sonographic Murphy sign noted by sonographer. Common bile duct: Diameter: Normal at 1.5 mm Liver: Small  subtly hypoechoic solid lesion in the posterosuperior aspect of the right liver measures 1.2 x 1.1 cm. Hepatic parenchymal echogenicity and echotexture within normal limits. Portal vein is patent on color Doppler imaging with normal direction of blood flow towards the liver. Other: None. IMPRESSION: 1. Small nonspecific solid 1.2 cm hypoechoic lesion in the  posterosuperior aspect of the right liver. Differential considerations are broad and include both malignant and benign neoplastic processes. Recommend further evaluation with gadolinium-enhanced MRI of the abdomen. 2. Otherwise, unremarkable right upper quadrant ultrasound. Electronically Signed   By: Jacqulynn Cadet M.D.   On: 01/15/2021 06:37    ASSESSMENT AND PLAN: 1.  Extensive stage small cell lung cancer 2.  Pancytopenia secondary to chemotherapy 3.  Dysphagia, odynophagia 4.  Dehydration, improved 5.  Symptomatic pyuria 6.  Transaminitis and hyperbilirubinemia 7.  Hypothyroidism 8.  History of breast cancer 9. DNR   -The patient received recent chemotherapy for her lung cancer.  She is now pancytopenic secondary to her chemotherapy.  She is already received G-CSF and we will not plan to add any Granix.  Recommend close monitoring of her CBC and transfuse PRBCs for hemoglobin less than 8 and transfuse platelets for platelet count less than 10,000 or active bleeding. -Appreciate assistance from GI.  Agree with empiric treatment of possible candidiasis with fluconazole. -Continue IV fluids for dehydration/AKI. -Agree with empiric antibiotics for pyuria as she is at very high risk for infection. -Etiology of transaminitis and bilirubinemia is unclear. ?  Due to recent chemo.  Monitor for now.   LOS: 0 days   Mikey Bussing, DNP, AGPCNP-BC, AOCNP 01/15/21  Addendum  I have seen the patient, examined her. I agree with the assessment and and plan and have edited the notes.   Pt has experienced some dysphagia in the past few months, got much worse after last cycle chemotherapy.  She does report oral thrush in the past few weeks.  She is quite neutropenic, along with anemia and thrombocytopenia, mainly from her recent chemotherapy, so GI is not able to do EGD until neutropenia resolves. I agree with empiric fluconazole, appreciate GI input.  Her transaminitis are likely from liver  metastasis and chemotherapy, I reviewed her ultrasound results.  If her bilirubin gets worse, then I agree with MRI/MRCP, will get GI input on this also. Continue supportive care. Pt is DNR. Will f/u. Appreciate the excellent care from our hospitalist team.   [name]  01/15/2021

## 2021-01-16 ENCOUNTER — Inpatient Hospital Stay: Payer: Medicare Other | Admitting: Nurse Practitioner

## 2021-01-16 ENCOUNTER — Inpatient Hospital Stay: Payer: Medicare Other

## 2021-01-16 DIAGNOSIS — E440 Moderate protein-calorie malnutrition: Secondary | ICD-10-CM | POA: Insufficient documentation

## 2021-01-16 DIAGNOSIS — Z95828 Presence of other vascular implants and grafts: Secondary | ICD-10-CM | POA: Diagnosis not present

## 2021-01-16 DIAGNOSIS — E039 Hypothyroidism, unspecified: Secondary | ICD-10-CM | POA: Diagnosis not present

## 2021-01-16 DIAGNOSIS — R1310 Dysphagia, unspecified: Secondary | ICD-10-CM | POA: Diagnosis not present

## 2021-01-16 DIAGNOSIS — Z853 Personal history of malignant neoplasm of breast: Secondary | ICD-10-CM | POA: Diagnosis not present

## 2021-01-16 LAB — COMPREHENSIVE METABOLIC PANEL
ALT: 276 U/L — ABNORMAL HIGH (ref 0–44)
AST: 52 U/L — ABNORMAL HIGH (ref 15–41)
Albumin: 3.3 g/dL — ABNORMAL LOW (ref 3.5–5.0)
Alkaline Phosphatase: 89 U/L (ref 38–126)
Anion gap: 10 (ref 5–15)
BUN: 9 mg/dL (ref 8–23)
CO2: 20 mmol/L — ABNORMAL LOW (ref 22–32)
Calcium: 8.4 mg/dL — ABNORMAL LOW (ref 8.9–10.3)
Chloride: 104 mmol/L (ref 98–111)
Creatinine, Ser: 0.47 mg/dL (ref 0.44–1.00)
GFR, Estimated: >60 mL/min (ref >60)
Glucose, Bld: 95 mg/dL (ref 70–99)
Potassium: 3.7 mmol/L (ref 3.5–5.1)
Sodium: 134 mmol/L — ABNORMAL LOW (ref 135–145)
Total Bilirubin: 2.5 mg/dL — ABNORMAL HIGH (ref 0.3–1.2)
Total Protein: 5.5 g/dL — ABNORMAL LOW (ref 6.5–8.1)

## 2021-01-16 LAB — CBC WITH DIFFERENTIAL/PLATELET
HCT: 25.2 % — ABNORMAL LOW (ref 36.0–46.0)
Hemoglobin: 8.4 g/dL — ABNORMAL LOW (ref 12.0–15.0)
MCH: 27.7 pg (ref 26.0–34.0)
MCHC: 33.3 g/dL (ref 30.0–36.0)
MCV: 83.2 fL (ref 80.0–100.0)
Platelets: 36 10*3/uL — ABNORMAL LOW (ref 150–400)
RBC: 3.03 MIL/uL — ABNORMAL LOW (ref 3.87–5.11)
RDW: 16.6 % — ABNORMAL HIGH (ref 11.5–15.5)
WBC: 0.2 10*3/uL — CL (ref 4.0–10.5)
nRBC: 0 % (ref 0.0–0.2)

## 2021-01-16 LAB — MAGNESIUM: Magnesium: 1.6 mg/dL — ABNORMAL LOW (ref 1.7–2.4)

## 2021-01-16 MED ORDER — FENTANYL 12 MCG/HR TD PT72
1.0000 | MEDICATED_PATCH | TRANSDERMAL | Status: DC
  Administered 2021-01-16 – 2021-02-03 (×7): 1 via TRANSDERMAL
  Filled 2021-01-16 (×7): qty 1

## 2021-01-16 MED ORDER — MAGNESIUM SULFATE 2 GM/50ML IV SOLN
2.0000 g | Freq: Once | INTRAVENOUS | Status: AC
  Administered 2021-01-16: 2 g via INTRAVENOUS
  Filled 2021-01-16: qty 50

## 2021-01-16 NOTE — Progress Notes (Signed)
PROGRESS NOTE  Tara Rodgers  VFI:[id number] DOB: 1945/10/29 DOA: 01/28/2021 PCP: Elby Showers, MD  Outpatient Specialists: Oncology, Dr. [name] Brief Narrative: Tara Rodgers is a 75 y.o. female with a history of metastatic SCLC undergoing chemotherapy s/p XRT, transfusion-dependent anemia, thrombocytopenia, right breast CA s/p mastectomy 2016 on antiestrogen therapy who has experienced 3 weeks of constant, worsening, severe dysphagia, globus sensation, and odynophagia that has limited per oral intake severely to the point that she isn't able to tolerate any po and not able to take her medications. In the ED she appeared dehydrated with ketones in urine. Tachycardic into 130's, afebrile and normotensive on arrival, with benign abdominal exam, hypokalemic (2.9) with NAGMA, and worse LFT elevations (AST 113, ALT 479, TBili 2.7) than prior. 2L IVF were given, potassium supplemented, and the patient was admitted with GI consultation for EGD. IV fluids rehydration unveiled pancytopenia which precludes endoscopy at this time.   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 - Nystatin, diflucan IV ordered. - GI consulted, recommended empiric infectious Tx. IF cytopenias improve and symptoms remain, would reach back out to them for EGD. - Clears if tolerated.  Pancytopenia:  - Neutropenic precautions. - Monitoring hgb, stable above transfusion threshold. - Hold pharmacologic DVT ppx given degree of thrombocytopenia.    Moderate protein calorie malnutrition:  - Supplement protein as much as able.   Dehydration, AKI: SCr 0.84 > 0.43 with IV fluids.   - Continue LR + 25mEq KCl @ 100cc/hr. Acidosis and hypokalemia improved.   Hypokalemia:  - Continue IV supplementation - Repeat  supplementation of Mg and monitor.  Symptomatic pyuria: Afebrile. - Covering with ceftriaxone pending urine culture.  Stage IV small cell lung cancer: Seen on PET 1/28 started on etoposide, carboplatin and atezolizumab 11/22/2020, s/p palliative mediastinal XRT 1/31 - 2/15.  - Oncology, Dr. [name] consulted  Solid 1.2 cm hypoechoic lesion in the posterosuperior aspect of the right liver: Nonspecific.  - Consider contrast-enhanced MRI. Will defer to oncology.  LFT elevation: Multifactorial, improved. Oncology suspects metastatic disease and chemotherapy. RUQ U/S without ductal dilatation - Monitor.   HTN, tachycardia:  - Continue IV metoprolol, hopefully could change back to po formulation of medications soon.    Hypothyroidism:  - Continue synthroid to IV for now. Has had good response following TSH.  History of breast CA: On antiestrogen which we will hold while unable to take po.  History of covid-19 infection and s/p 2 doses of vaccine: Recommended 3rd dose.   DVT prophylaxis: SCDs Code Status: DNR Family Communication: None at bedside Disposition Plan:  Status is: Inpatient  Remains inpatient appropriate because:Ongoing diagnostic testing needed not appropriate for outpatient work up and IV treatments appropriate due to intensity of illness or inability to take PO  Dispo: The patient is from: Home              Anticipated d/c is to: Home              Patient currently is not medically stable to d/c.   Difficult to place patient No  Consultants:  GI, Dr. Cristina Gong Oncology, Dr. [name]  Procedures:  None  Antimicrobials: Ceftriaxone   Subjective: Still severe pain on swallowing even ice chips with some nausea and even vomiting at times.  Objective: Vitals:   01/15/21 2313 01/16/21 0536 01/16/21 0816 01/16/21 1448  BP: 127/66 128/71  133/79  Pulse: (!) 104 99  95  Resp: 16 18  16   Temp: 98.6 F (37 C) 98.1 F (36.7 C)  98.9 F (37.2 C)  TempSrc: Oral Oral  Oral   SpO2: 100% 99%  100%  Weight:   51 kg   Height:   4\' 11"  (1.499 m)     Intake/Output Summary (Last 24 hours) at 01/16/2021 1511 Last data filed at 01/16/2021 0834 Gross per 24 hour  Intake --  Output 700 ml  Net -700 ml   Gen: Malnourished, small female in no distress Pulm: Nonlabored breathing room air. Clear. CV: Regular rate and rhythm. No murmur, rub, or gallop. No JVD, no dependent edema. GI: Abdomen soft, non-tender, non-distended, with normoactive bowel sounds.  Ext: Warm, decreased muscle bulk Skin: No rashes, lesions or ulcers on visualized skin. Port site c/d/I without erythema, accessed. Neuro: Alert and oriented. No focal neurological deficits. Psych: Judgement and insight appear fair. Pleasant. Mood euthymic & affect congruent. Behavior is appropriate.    Data Reviewed: I have personally reviewed following labs and imaging studies  CBC: Recent Labs  Lab 01/15/2021 1443 01/15/21 0552 01/15/21 1114 01/16/21 0547  WBC 4.5 0.8* 0.6* 0.2*  NEUTROABS  --   --  0.4*  --   HGB 10.0* 8.2* 8.0* 8.4*  HCT 30.3* 24.8* 24.4* 25.2*  MCV 83.5 83.8 83.8 83.2  PLT 89* 53* 42* 36*   Basic Metabolic Panel: Recent Labs  Lab 01/27/2021 1443 01/15/21 0552 01/16/21 0547  NA 136 133* 134*  K 2.9* 3.1* 3.7  CL 106 106 104  CO2 17* 18* 20*  GLUCOSE 113* 89 95  BUN 17 10 9   CREATININE 0.84 0.43* 0.47  CALCIUM 8.7* 7.8* 8.4*  MG 1.7  --  1.6*   GFR: Estimated Creatinine Clearance: 42.7 mL/min (by C-G formula based on SCr of 0.47 mg/dL). Liver Function Tests: Recent Labs  Lab 01/12/2021 1443 01/15/21 0552 01/16/21 0547  AST 113* 74* 52*  ALT 479* 338* 276*  ALKPHOS 103 82 89  BILITOT 2.7* 2.8* 2.5*  PROT 6.4* 5.4* 5.5*  ALBUMIN 3.8 3.1* 3.3*   Recent Labs  Lab 01/20/2021 1443  LIPASE 45   No results for input(s): AMMONIA in the last 168 hours. Coagulation Profile: No results for input(s): INR, PROTIME in the last 168 hours. Cardiac Enzymes: No results for input(s):  CKTOTAL, CKMB, CKMBINDEX, TROPONINI in the last 168 hours. BNP (last 3 results) No results for input(s): PROBNP in the last 8760 hours. HbA1C: No results for input(s): HGBA1C in the last 72 hours. CBG: No results for input(s): GLUCAP in the last 168 hours. Lipid Profile: No results for input(s): CHOL, HDL, LDLCALC, TRIG, CHOLHDL, LDLDIRECT in the last 72 hours. Thyroid Function Tests: No results for input(s): TSH, T4TOTAL, FREET4, T3FREE, THYROIDAB in the last 72 hours. Anemia Panel: No results for input(s): VITAMINB12, FOLATE, FERRITIN, TIBC, IRON, RETICCTPCT in the last 72 hours. Urine analysis:    Component Value Date/Time   COLORURINE STRAW (A) 02/01/2021 1834   APPEARANCEUR CLEAR 01/21/2021 1834   LABSPEC 1.005 01/13/2021 1834   PHURINE 7.0 01/31/2021 1834   GLUCOSEU NEGATIVE 01/26/2021 1834   HGBUR MODERATE (A) 01/12/2021 1834   BILIRUBINUR NEGATIVE 01/30/2021 1834   BILIRUBINUR NEG 09/05/2020 1215   KETONESUR 5 (A) 02/04/2021 1834   PROTEINUR NEGATIVE 01/22/2021 1834   UROBILINOGEN 0.2 09/05/2020 1215  NITRITE NEGATIVE 02/04/2021 1834   LEUKOCYTESUR NEGATIVE 02/03/2021 1834   Recent Results (from the past 240 hour(s))  SARS CORONAVIRUS 2 (TAT 6-24 HRS) Nasopharyngeal Nasopharyngeal Swab     Status: None   Collection Time: 02/03/2021  6:34 PM   Specimen: Nasopharyngeal Swab  Result Value Ref Range Status   SARS Coronavirus 2 NEGATIVE NEGATIVE Final    Comment: (NOTE) SARS-CoV-2 target nucleic acids are NOT DETECTED.  The SARS-CoV-2 RNA is generally detectable in upper and lower respiratory specimens during the acute phase of infection. Negative results do not preclude SARS-CoV-2 infection, do not rule out co-infections with other pathogens, and should not be used as the sole basis for treatment or other patient management decisions. Negative results must be combined with clinical observations, patient history, and epidemiological information. The expected result is  Negative.  Fact Sheet for Patients: SugarRoll.be  Fact Sheet for Healthcare Providers: https://www.woods-mathews.com/  This test is not yet approved or cleared by the [place] FDA and  has been authorized for detection and/or diagnosis of SARS-CoV-2 by FDA under an Emergency Use Authorization (EUA). This EUA will remain  in effect (meaning this test can be used) for the duration of the COVID-19 declaration under Se ction 564(b)(1) of the Act, 21 U.S.C. section 360bbb-3(b)(1), unless the authorization is terminated or revoked sooner.  Performed at [facility], [place] [address], [place], [place] [address]       Radiology Studies: US Abdomen Limited RUQ (LIVER/GB)  Result Date: 01/15/2021 CLINICAL DATA:  Elevated LFTs EXAM: ULTRASOUND ABDOMEN LIMITED RIGHT UPPER QUADRANT COMPARISON:  None. FINDINGS: Gallbladder: No gallstones or wall thickening visualized. No sonographic Murphy sign noted by sonographer. Common bile duct: Diameter: Normal at 1.5 mm Liver: Small subtly hypoechoic solid lesion in the posterosuperior aspect of the right liver measures 1.2 x 1.1 cm. Hepatic parenchymal echogenicity and echotexture within normal limits. Portal vein is patent on color Doppler imaging with normal direction of blood flow towards the liver. Other: None. IMPRESSION: 1. Small nonspecific solid 1.2 cm hypoechoic lesion in the posterosuperior aspect of the right liver. Differential considerations are broad and include both malignant and benign neoplastic processes. Recommend further evaluation with gadolinium-enhanced MRI of the abdomen. 2. Otherwise, unremarkable right upper quadrant ultrasound. Electronically Signed   By: Jacqulynn Cadet M.D.   On: 01/15/2021 06:37    Scheduled Meds:  Chlorhexidine Gluconate Cloth  6 each Topical Daily   feeding supplement  1 Container Oral TID BM   fentaNYL (SUBLIMAZE) injection  12.5 mcg Intravenous Once    levothyroxine  50 mcg Intravenous Daily   mouth rinse  15 mL Mouth Rinse BID   metoprolol tartrate  5 mg Intravenous Q6H   nystatin  5 mL Oral QID   pantoprazole (PROTONIX) IV  40 mg Intravenous Q24H   sodium chloride flush  10-40 mL Intracatheter Q12H   sodium chloride flush  3 mL Intravenous Q12H   Continuous Infusions:  cefTRIAXone (ROCEPHIN)  IV 1 g (01/16/21 1215)   fluconazole (DIFLUCAN) IV 200 mg (01/16/21 1052)   lactated ringers with kcl 100 mL/hr at 01/16/21 0546     LOS: 1 day   Time spent: 25 minutes.  Patrecia Pour, MD [facility] [email or web address] 01/16/2021, 3:11 PM

## 2021-01-16 NOTE — Progress Notes (Signed)
Initial Nutrition Assessment  DOCUMENTATION CODES:   Non-severe (moderate) malnutrition in context of chronic illness  INTERVENTION:   -Continue Boost Breeze po TID, each supplement provides 250 kcal and 9 grams of protein (mix with Sprite or ginger ale)  -Will monitor for diet advancement   NUTRITION DIAGNOSIS:   Moderate Malnutrition related to chronic illness,cancer and cancer related treatments as evidenced by energy intake < or equal to 75% for > or equal to 1 month,percent weight loss,mild fat depletion,mild muscle depletion.  GOAL:   Patient will meet greater than or equal to 90% of their needs  MONITOR:   PO intake,Supplement acceptance,Weight trends,Labs,I & O's  REASON FOR ASSESSMENT:   Malnutrition Screening Tool    ASSESSMENT:   75 y.o. female with a history of metastatic SCLC undergoing chemotherapy s/p XRT, transfusion-dependent anemia, thrombocytopenia, right breast CA s/p mastectomy 2016 on antiestrogen therapy who has experienced 3 weeks of constant, worsening, severe dysphagia, globus sensation, and odynophagia that has limited per oral intake severely to the point that she isn't able to tolerate any po and not able to take her medications. In the ED she appeared dehydrated with ketones in urine.  Patient in room, sleeping but did waken upon RD visit. Pt states she feels tired today. States her swallowing has not improved. Has been having dysphagia symptoms for months now. Has been mainly subsisting on clears (juices) at home. Was seen by [place] RD on 3/28, she recommended at that time for pt to start clear liquid supplements. Pt states she has not been drinking them. Boost Breeze at bedside untouched. Is willing to try if mixed with Sprite or ginger ale.   Endorses that she is having off tastes. Pt currently being treated for thrush.   Per GI note, considering EGD.  Per weight records, pt has lost 20 lbs since 2/3 (15% wt loss x 2 months, significant  for time frame).  Medications: Lactated ringers, IV Mg sulfate  Lab reviewed: Low Na, Mg  NUTRITION - FOCUSED PHYSICAL EXAM:  Flowsheet Row Most Recent Value  Orbital Region Mild depletion  Upper Arm Region No depletion  Thoracic and Lumbar Region Unable to assess  Buccal Region Mild depletion  Temple Region Mild depletion  Clavicle Bone Region No depletion  Clavicle and Acromion Bone Region No depletion  Scapular Bone Region No depletion  Dorsal Hand Mild depletion  Patellar Region No depletion  Anterior Thigh Region No depletion  Posterior Calf Region Mild depletion  Edema (RD Assessment) None  Hair Reviewed  [covered but has hair loss]  Eyes Reviewed  Mouth Reviewed  Skin Reviewed       Diet Order:   Diet Order            Diet clear liquid Room service appropriate? Yes; Fluid consistency: Thin  Diet effective now                 EDUCATION NEEDS:   Education needs have been addressed  Skin:  Skin Assessment: Reviewed RN Assessment  Last BM:  4/2  Height:   Ht Readings from Last 1 Encounters:  01/16/21 4\' 11"  (1.499 m)    Weight:   Wt Readings from Last 1 Encounters:  01/16/21 51 kg   BMI:  Body mass index is 22.71 kg/m.  Estimated Nutritional Needs:   Kcal:  1600-1800  Protein:  80-95g  Fluid:  1.8L/day   Clayton Bibles, MS, RD, LDN Inpatient Clinical Dietitian Contact information available via Amion

## 2021-01-16 NOTE — Progress Notes (Signed)
Neutropenia is more profound this morning, with a total white count of 200.  Meanwhile, on IV fluconazole, the patient's dysphagia is perhaps marginally improved, nothing dramatic so far.  Dr. Ernestina Penna note reviewed.  Recommendations:  1.  If patient's dysphagia and odynophagia symptoms resolve, endoscopic evaluation is probably not necessary.  2.  If the patient's dysphagia and odynophagia symptoms persist, and her white count comes up to an acceptable level (as defined by Dr. [name]) for endoscopic evaluation, we would be happy to do endoscopy on the patient.  3.  In the meantime, we will sign off.  Please call us if further input from [place] would be helpful, or if we can otherwise assist in this patient's care.  Cleotis Nipper, M.D. Pager [phone] If no answer or after 5 PM call [phone]

## 2021-01-17 DIAGNOSIS — C3490 Malignant neoplasm of unspecified part of unspecified bronchus or lung: Secondary | ICD-10-CM | POA: Diagnosis not present

## 2021-01-17 DIAGNOSIS — E876 Hypokalemia: Secondary | ICD-10-CM

## 2021-01-17 DIAGNOSIS — E860 Dehydration: Secondary | ICD-10-CM

## 2021-01-17 DIAGNOSIS — D701 Agranulocytosis secondary to cancer chemotherapy: Secondary | ICD-10-CM

## 2021-01-17 DIAGNOSIS — R1310 Dysphagia, unspecified: Secondary | ICD-10-CM | POA: Diagnosis not present

## 2021-01-17 DIAGNOSIS — R7989 Other specified abnormal findings of blood chemistry: Secondary | ICD-10-CM

## 2021-01-17 DIAGNOSIS — T451X5A Adverse effect of antineoplastic and immunosuppressive drugs, initial encounter: Secondary | ICD-10-CM

## 2021-01-17 DIAGNOSIS — Z853 Personal history of malignant neoplasm of breast: Secondary | ICD-10-CM

## 2021-01-17 DIAGNOSIS — R8281 Pyuria: Secondary | ICD-10-CM

## 2021-01-17 LAB — CBC WITH DIFFERENTIAL/PLATELET
HCT: 24.8 % — ABNORMAL LOW (ref 36.0–46.0)
Hemoglobin: 8.2 g/dL — ABNORMAL LOW (ref 12.0–15.0)
MCH: 27.3 pg (ref 26.0–34.0)
MCHC: 33.1 g/dL (ref 30.0–36.0)
MCV: 82.7 fL (ref 80.0–100.0)
Platelets: 20 10*3/uL — CL (ref 150–400)
RBC: 3 MIL/uL — ABNORMAL LOW (ref 3.87–5.11)
RDW: 16.1 % — ABNORMAL HIGH (ref 11.5–15.5)
WBC: 0.2 10*3/uL — CL (ref 4.0–10.5)
nRBC: 0 % (ref 0.0–0.2)

## 2021-01-17 LAB — COMPREHENSIVE METABOLIC PANEL
ALT: 215 U/L — ABNORMAL HIGH (ref 0–44)
AST: 41 U/L (ref 15–41)
Albumin: 3.2 g/dL — ABNORMAL LOW (ref 3.5–5.0)
Alkaline Phosphatase: 92 U/L (ref 38–126)
Anion gap: 11 (ref 5–15)
BUN: 8 mg/dL (ref 8–23)
CO2: 19 mmol/L — ABNORMAL LOW (ref 22–32)
Calcium: 8.3 mg/dL — ABNORMAL LOW (ref 8.9–10.3)
Chloride: 104 mmol/L (ref 98–111)
Creatinine, Ser: 0.67 mg/dL (ref 0.44–1.00)
GFR, Estimated: >60 mL/min (ref >60)
Glucose, Bld: 95 mg/dL (ref 70–99)
Potassium: 4.4 mmol/L (ref 3.5–5.1)
Sodium: 134 mmol/L — ABNORMAL LOW (ref 135–145)
Total Bilirubin: 2.3 mg/dL — ABNORMAL HIGH (ref 0.3–1.2)
Total Protein: 5.4 g/dL — ABNORMAL LOW (ref 6.5–8.1)

## 2021-01-17 LAB — URINE CULTURE

## 2021-01-17 MED ORDER — SUCRALFATE 1 GM/10ML PO SUSP
1.0000 g | Freq: Three times a day (TID) | ORAL | Status: DC
  Administered 2021-01-17 – 2021-01-29 (×42): 1 g via ORAL
  Filled 2021-01-17 (×46): qty 10

## 2021-01-17 MED ORDER — POTASSIUM CHLORIDE 2 MEQ/ML IV SOLN
INTRAVENOUS | Status: DC
  Filled 2021-01-17 (×7): qty 1000

## 2021-01-17 MED ORDER — KCL-LACTATED RINGERS 20 MEQ/L IV SOLN
INTRAVENOUS | Status: DC
  Filled 2021-01-17: qty 1000

## 2021-01-17 MED ORDER — TBO-FILGRASTIM 480 MCG/0.8ML ~~LOC~~ SOSY
480.0000 ug | PREFILLED_SYRINGE | Freq: Once | SUBCUTANEOUS | Status: AC
Start: 2021-01-17 — End: 2021-01-17
  Administered 2021-01-17: 480 ug via SUBCUTANEOUS
  Filled 2021-01-17: qty 0.8

## 2021-01-17 MED ORDER — MAGIC MOUTHWASH W/LIDOCAINE
5.0000 mL | Freq: Four times a day (QID) | ORAL | Status: DC | PRN
  Administered 2021-01-17 – 2021-01-29 (×5): 5 mL via ORAL
  Filled 2021-01-17 (×6): qty 5

## 2021-01-17 NOTE — Progress Notes (Signed)
PROGRESS NOTE  Tara Rodgers  DOB: [birth year]  PCP: Elby Showers, MD BMW:[id number]  DOA: 02/04/2021  LOS: 2 days   Chief Complaint  Patient presents with  . Emesis  . Nausea  . Dysphagia    Brief narrative: Tara Rodgers is a 75 y.o. female with PMH significant for metastatic SCLC undergoing chemotherapy s/p XRT, transfusion-dependent anemia, thrombocytopenia, right breast CA s/p mastectomy 2016 on antiestrogen therapy. Patient presented to the ED on 01/30/2021 with complaint of 3 weeks of constant, worsening, severe dysphagia, globus sensation, and odynophagia that has limited per oral intake severely to the point that she was not able to tolerate any po and not able to take her medications.  In the ED, she appeared dehydrated, tachycardic to 130s, afebrile, normotensive, benign abdominal exam Labs with ketones in urine, potassium low at 2.9, metabolic acidosis, LFTs elevated.   Patient was admitted to hospitalist service GI consultation was obtained. EGD were tentatively planned but could not be done because of subsequent pancytopenia.  Subjective: Patient was seen and examined this morning. Pleasant elderly Caucasian female.  Sitting up in bed.  Not in distress.  Still complains of painful swallowing though.  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 -Gradually with nystatin, diflucan IV ordered.  Add Magic mouthwash -GI consulted, recommended empiric infectious Tx. IF cytopenias improve and symptoms remain, would reach back out to them for EGD. -Currently on clear liquid diet.  Pancytopenia -CBC being monitored.  Hemoglobin 8.2, platelet further down to 20,000 today.  Continue to monitor  -Continue neutropenic precautions. -old pharmacologic DVT ppx given degree of thrombocytopenia.  Recent Labs  Lab 01/21/2021 1443 01/15/21 0552 01/15/21 1114 01/16/21 0547 01/17/21 0446  WBC 4.5 0.8* 0.6* 0.2* 0.2*  NEUTROABS  --   --  0.4*  --   --    HGB 10.0* 8.2* 8.0* 8.4* 8.2*  HCT 30.3* 24.8* 24.4* 25.2* 24.8*  MCV 83.5 83.8 83.8 83.2 82.7  PLT 89* 53* 42* 36* 20*   Moderate protein calorie malnutrition -Supplement protein as much as able  Hypokalemia/hypomagnesemia -Improved with replacement.  Recent Labs  Lab 02/07/2021 1443 01/15/21 0552 01/16/21 0547 01/17/21 0446  K 2.9* 3.1* 3.7 4.4  MG 1.7  --  1.6*  --    Stage IV small cell lung cancer -Seen on PET 1/28 started on etoposide, carboplatin and atezolizumab 11/22/2020, s/p palliative mediastinal XRT 1/31 - 2/15.  -Oncology, Dr. [name] consulted  Solid 1.2 cm hypoechoic lesion in the posterosuperior aspect of the right liver: Nonspecific.  -Consider contrast-enhanced MRI. Will defer to oncology.  LFT elevation -Multifactorial, improved. Oncology suspects metastatic disease and chemotherapy. RUQ U/S without ductal dilatation -Monitor.  Recent Labs  Lab 02/02/2021 1443 01/15/21 0552 01/16/21 0547 01/17/21 0446  AST 113* 74* 52* 41  ALT 479* 338* 276* 215*  ALKPHOS 103 82 89 92  BILITOT 2.7* 2.8* 2.5* 2.3*  PROT 6.4* 5.4* 5.5* 5.4*  ALBUMIN 3.8 3.1* 3.3* 3.2*   HTN, tachycardia - Continue IV metoprolol, hopefully could change back to po formulation of medications soon.  Hypothyroidism - Continue synthroid to IV for now. Has had good response following TSH.  History of breast CA -On antiestrogen which we will hold while unable to take po.  History of covid-19 infection and s/p 2 doses of vaccine -Recommended 3rd dose.  Impaired mobility -PT eval obtained.  SNF recommended.  Mobility: PT eval obtained Code Status:   Code Status: DNR  Nutritional status: Body mass index is 22.71  kg/m. Nutrition Problem: Moderate Malnutrition Etiology: chronic illness,cancer and cancer related treatments Signs/Symptoms: energy intake < or equal to 75% for > or equal to 1 month,percent weight loss,mild fat depletion,mild muscle depletion Diet Order             Diet clear liquid Room service appropriate? Yes; Fluid consistency: Thin  Diet effective now                 DVT prophylaxis: SCDs Start: 01/17/2021 1939   Antimicrobials:  IV Rocephin, fluconazole Fluid: LR at 100 mill per hour Consultants: GI, oncology Family Communication:  None at bedside  Status is: Inpatient  Remains inpatient appropriate because: Continues to have dysphagia, pancytopenia  Dispo: The patient is from: Home              Anticipated d/c is to: Home versus SNF              Patient currently is not medically stable to d/c.   Difficult to place patient No       Infusions:  . cefTRIAXone (ROCEPHIN)  IV 1 g (01/17/21 1314)  . fluconazole (DIFLUCAN) IV 200 mg (01/17/21 0949)  . lactated ringers with kcl 100 mL/hr at 01/17/21 0514    Scheduled Meds: . Chlorhexidine Gluconate Cloth  6 each Topical Daily  . feeding supplement  1 Container Oral TID BM  . fentaNYL  1 patch Transdermal Q72H  . fentaNYL (SUBLIMAZE) injection  12.5 mcg Intravenous Once  . levothyroxine  50 mcg Intravenous Daily  . mouth rinse  15 mL Mouth Rinse BID  . metoprolol tartrate  5 mg Intravenous Q6H  . nystatin  5 mL Oral QID  . pantoprazole (PROTONIX) IV  40 mg Intravenous Q24H  . sodium chloride flush  10-40 mL Intracatheter Q12H  . sodium chloride flush  3 mL Intravenous Q12H    Antimicrobials: Anti-infectives (From admission, onward)   Start     Dose/Rate Route Frequency Ordered Stop   01/16/21 1000  fluconazole (DIFLUCAN) IVPB 200 mg        200 mg 100 mL/hr over 60 Minutes Intravenous Daily 01/15/21 1024     01/15/21 1300  cefTRIAXone (ROCEPHIN) 1 g in sodium chloride 0.9 % 100 mL IVPB        1 g 200 mL/hr over 30 Minutes Intravenous Every 24 hours 01/15/21 1210     01/15/21 1100  fluconazole (DIFLUCAN) IVPB 400 mg        400 mg 100 mL/hr over 120 Minutes Intravenous  Once 01/15/21 1024 01/15/21 1326      PRN meds: acetaminophen **OR** [DISCONTINUED] acetaminophen,  LORazepam, magic mouthwash w/lidocaine, ondansetron (ZOFRAN) IV, sodium chloride flush   Objective: Vitals:   01/16/21 2335 01/17/21 0436  BP: (!) 142/80 132/71  Pulse: (!) 109 98  Resp: 16 16  Temp: 98.9 F (37.2 C) 97.9 F (36.6 C)  SpO2: 100% 100%    Intake/Output Summary (Last 24 hours) at 01/17/2021 1443 Last data filed at 01/17/2021 0900 Gross per 24 hour  Intake 4863.34 ml  Output 1000 ml  Net 3863.34 ml   Filed Weights   01/16/21 0816  Weight: 51 kg   Weight change:  Body mass index is 22.71 kg/m.   Physical Exam: General exam: Pleasant elderly Caucasian female. Skin: No rashes, lesions or ulcers. HEENT: Atraumatic, normocephalic, no obvious bleeding Lungs: Clear to auscultation bilaterally CVS: Regular rate and rhythm GI/Abd soft, nontender, nondistended, bowel sound present CNS: Alert, awake, oriented x3 Psychiatry:  Depressed look Extremities: No pedal edema, no calf tenderness  Data Review: I have personally reviewed the laboratory data and studies available.  Recent Labs  Lab 01/12/2021 1443 01/15/21 0552 01/15/21 1114 01/16/21 0547 01/17/21 0446  WBC 4.5 0.8* 0.6* 0.2* 0.2*  NEUTROABS  --   --  0.4*  --   --   HGB 10.0* 8.2* 8.0* 8.4* 8.2*  HCT 30.3* 24.8* 24.4* 25.2* 24.8*  MCV 83.5 83.8 83.8 83.2 82.7  PLT 89* 53* 42* 36* 20*   Recent Labs  Lab 02/06/2021 1443 01/15/21 0552 01/16/21 0547 01/17/21 0446  NA 136 133* 134* 134*  K 2.9* 3.1* 3.7 4.4  CL 106 106 104 104  CO2 17* 18* 20* 19*  GLUCOSE 113* 89 95 95  BUN 17 10 9 8   CREATININE 0.84 0.43* 0.47 0.67  CALCIUM 8.7* 7.8* 8.4* 8.3*  MG 1.7  --  1.6*  --     F/u labs ordered Unresulted Labs (From admission, onward)          Start     Ordered   01/18/21 0500  CBC with Differential/Platelet  Daily,   R     Question:  Specimen collection method  Answer:  Unit=Unit collect   01/17/21 1443   01/18/21 4982  Basic metabolic panel  Daily,   R     Question:  Specimen collection method   Answer:  Unit=Unit collect   01/17/21 1443   01/18/21 0500  Magnesium  Tomorrow morning,   STAT       Question:  Specimen collection method  Answer:  Unit=Unit collect   01/17/21 1443          Signed, Terrilee Croak, MD [facility] 01/17/2021

## 2021-01-17 NOTE — Evaluation (Signed)
Physical Therapy Evaluation Patient Details Name: Tara Rodgers MRN: [id number] DOB: Mar 05, 1946 Today's Date: 01/17/2021   History of Present Illness  75 y.o. female admitted with 3 weeks of constant, worsening, severe dysphagia, globus sensation, and odynophagia that has limited per oral intake severely to the point that she isn't able to tolerate any po and not able to take her medications. PMH of metastatic SCLC undergoing chemotherapy s/p XRT, transfusion-dependent anemia, thrombocytopenia, right breast CA s/p mastectomy 2016.  Clinical Impression  Pt admitted with above diagnosis. Pt resting in bed, she had recently transferred from bed to recliner to bed with OT. She reported she's too tired to attempt further mobility. Pt agreed to bed level LE exercises. Pt fatigued very quickly with exercises. She may need ST-SNF if family is unable to provide 24* care at home. She was independent with WC transfers at home, she hasn't walked since February.  Pt currently with functional limitations due to the deficits listed below (see PT Problem List). Pt will benefit from skilled PT to increase their independence and safety with mobility to allow discharge to the venue listed below.       Follow Up Recommendations SNF;Supervision for mobility/OOB;Supervision/Assistance - 24 hour (ST-SNF if family unable to provide 24* assist)    Equipment Recommendations  None recommended by PT    Recommendations for Other Services       Precautions / Restrictions Precautions Precautions: Fall Restrictions Weight Bearing Restrictions: No      Mobility  Bed Mobility Overal bed mobility: Needs Assistance Bed Mobility: Supine to Sit;Sit to Supine     Supine to sit: Supervision;HOB elevated Sit to supine: Supervision   General bed mobility comments: pt declined mobility 2* fatigue from recently getting up to recliner then back to bed with OT    Transfers Overall transfer level: Needs assistance    Transfers: Squat Pivot Transfers     Squat pivot transfers: Min assist     General transfer comment: Min assist for squat pivot to recliner and then back to bed.  Ambulation/Gait                Stairs            Wheelchair Mobility    Modified Rankin (Stroke Patients Only)       Balance Overall balance assessment: Needs assistance Sitting-balance support: No upper extremity supported Sitting balance-Leahy Scale: Good                                       Pertinent Vitals/Pain Pain Assessment: No/denies pain    Home Living Family/patient expects to be discharged to:: Private residence Living Arrangements: Spouse/significant other Available Help at Discharge: Available 24 hours/day Type of Home: House Home Access: Stairs to enter   [employer] of Steps: 1 Home Layout: One level Home Equipment: Clinical cytogeneticist - 2 wheels;Wheelchair - manual;Bedside commode Additional Comments: friend stays with her while her husband works    Prior Function Level of Independence: Needs assistance   Gait / Transfers Assistance Needed: reports she just pivots to one chair to another since Feb, uses WC, hasn't walked since Feb  ADL's / Homemaking Assistance Needed: Reports she can dress herself, has daughter assist with bathing. A "lady" that comes during the day to assist with home tasks and ADLs if needed.        Hand Dominance   Dominant Hand: Right  Extremity/Trunk Assessment   Upper Extremity Assessment Upper Extremity Assessment: Defer to OT evaluation    Lower Extremity Assessment Lower Extremity Assessment: Generalized weakness;RLE deficits/detail;LLE deficits/detail RLE Deficits / Details: decr to light touch B feet (? chemo induced?) RLE Sensation: decreased light touch LLE Sensation: decreased light touch    Cervical / Trunk Assessment Cervical / Trunk Assessment: Normal  Communication   Communication: No  difficulties  Cognition Arousal/Alertness: Awake/alert Behavior During Therapy: Flat affect Overall Cognitive Status: Within Functional Limits for tasks assessed                                        General Comments      Exercises General Exercises - Lower Extremity Ankle Circles/Pumps: AROM;Both;10 reps;Supine Quad Sets: AROM;Both;5 reps;Supine Heel Slides: AAROM;Both;5 reps;Supine   Assessment/Plan    PT Assessment Patient needs continued PT services  PT Problem List Decreased mobility;Decreased activity tolerance       PT Treatment Interventions Gait training;Functional mobility training;Therapeutic exercise;Balance training;Patient/family education;Therapeutic activities    PT Goals (Current goals can be found in the Care Plan section)  Acute Rehab PT Goals Patient Stated Goal: be able to walk PT Goal Formulation: With patient Time For Goal Achievement: 01/31/21 Potential to Achieve Goals: Fair    Frequency Min 3X/week   Barriers to discharge        Co-evaluation               AM-PAC PT "6 Clicks" Mobility  Outcome Measure Help needed turning from your back to your side while in a flat bed without using bedrails?: A Little Help needed moving from lying on your back to sitting on the side of a flat bed without using bedrails?: A Little Help needed moving to and from a bed to a chair (including a wheelchair)?: A Little Help needed standing up from a chair using your arms (e.g., wheelchair or bedside chair)?: A Little Help needed to walk in hospital room?: Total Help needed climbing 3-5 steps with a railing? : Total 6 Click Score: 14    End of Session   Activity Tolerance: Patient limited by fatigue Patient left: in bed;with call bell/phone within reach;with bed alarm set Nurse Communication: Mobility status PT Visit Diagnosis: Difficulty in walking, not elsewhere classified (R26.2)    Time: 2637-8588 PT Time Calculation (min) (ACUTE  ONLY): 11 min   Charges:   PT Evaluation $PT Eval Low Complexity: 1 Low          Philomena Doheny PT 01/17/2021  Acute Rehabilitation Services Pager [phone] Office [phone]

## 2021-01-17 NOTE — Progress Notes (Signed)
HEMATOLOGY-ONCOLOGY PROGRESS NOTE  SUBJECTIVE: Tara Rodgers still has moderate dysphagia and odynophagia, has variable appetite, only sipping on liquids, and quite fatigued.  No fever or bleeding, no other new complaints.    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 Tara Rodgers) Staging form: Breast, AJCC 7th Edition - Clinical: Stage IIA (T2, N0, M0) - Signed by [name], MD on 02/08/2015 Laterality: Right Estrogen receptor status: Positive Progesterone receptor status: Positive HER2 status: Negative - Pathologic stage from 03/23/2015: Stage Unknown (T2, NX, cM0) - Unsigned  Small cell lung cancer (Tara Rodgers) Staging form: Lung, AJCC 8th Edition - Clinical stage from 11/13/2020: Stage IV (cTX, cN3, cM1) - Signed by [name], MD on 11/16/2020 Stage prefix: Initial diagnosis     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) and adjuvant radiation ([name]).   12/13/2014 Mammogram   Right breast: possible asymmetry warranting further evaluation with spot compression views and possibly ultrasound   12/16/2014 Breast US   Right breast: no definitive abnormality within the superior aspect of the right breast to correspond with the mammographic finding.   01/10/2015 Breast MRI   Right breast: irregular rim enhancing mass within the superior right breast at 11:30 measuring 2.5 x 2.4 x 2.4 cm. No abnormal enhancement is identified within the skin of the superior right breast in the area of concern on mammogram.   01/12/2015 Breast US   Second look diagnostic mammogram and ultrasound showed a 2.3 cm mass in the upper midline right breast. No axillary adenopathy.   01/12/2015 Initial Biopsy   Right breast  needle core bx (upper midline): Invasive ductal carcinoma, grade 2, ER+ (100%), PR+ (77%), HER2/neu negative (ratio 1.15), Ki67 20%    01/30/2015 Procedure   Breast High/Moderate Risk panel ([facility]) reveals no clinically significant variant at ATM, BRCA1, BRCA2, CDH1, CHEK2, PALB2, PTEN, STK11, and TP53.   02/08/2015 Clinical Stage   Stage IIA (T2 N0)   03/23/2015 Definitive Surgery   Right mastectomy ([name]): invasive adenocarcinoma, grade 3, 2.9 cm, HER2/neu repeated, negative (ratio 1.23)   03/23/2015 Oncotype testing   Score: 8 (6% ROR). No chemotherapy [name]).   03/23/2015 Pathologic Stage   Stage IIA: pT2 pNx   05/04/2015 -  Anti-estrogen oral therapy   Letrozole 2.67m daily. Planned duration of therapy at least 5 years.    07/25/2015 Survivorship   Survivorship visit completed and copy of care plan provided to patient.   12/15/2015 Mammogram   IMPRESSION: No mammographic evidence of malignancy. A result letter of this screening mammogram will be mailed directly to the patient.   12/23/2016 Mammogram   IMPRESSION: No mammographic evidence of malignancy. A result letter of this screening mammogram will be mailed directly to the patient.    12/30/2017 Mammogram   12/30/2017 Mammogram IMPRESSION: No mammographic evidence of malignancy. A result letter of this screening mammogram will be mailed directly to the patient.   03/27/2020 Mammogram   IMPRESSION: No mammographic evidence of malignancy. A result letter of this screening mammogram will be mailed directly to the patient.   10/26/2020 Imaging   CT chest IMPRESSION: 1. Bulky mediastinal, thoracic inlet, right supraclavicular, and left axillary lymphadenopathy consistent with metastatic disease. Associated numerous bilateral pulmonary nodules and masses, most prominently in the right middle  lobe with dominant 4.1 cm peripheral mass lesion. 2. 16 mm rim enhancing lesion posterior right liver consistent with metastatic  involvement. 3. 13 mm hypoenhancing lesion in the tail of pancreas. Primary neoplasm or metastatic involvement could have this appearance. 4. Multiple nodules in the upper abdomen concerning for peritoneal/mesenteric metastatic disease. 5. Small right and tiny left pleural effusions with bibasilar atelectasis. 6. Aortic Atherosclerosis (ICD10-I70.0).   11/10/2020 PET scan   IMPRESSION: 1. Extensive hypermetabolic multi organ metastatic disease. 2. Bulky intensely hypermetabolic RIGHT supraclavicular, mediastinal and LEFT axillary adenopathy. 3. Hypermetabolic pulmonary mass in the RIGHT upper lobe with additional hypermetabolic nodules. 4. Hypermetabolic solitary hepatic metastasis. 5. Hypermetabolic lesion within the pancreas is also favored metastatic lesion. 6. Multifocal hypermetabolic skeletal metastasis. 7. Bilateral pleural effusions.   11/13/2020 Procedure   Thoracentesis  IMPRESSION: Successful ultrasound guided right thoracentesis yielding 200 mL of pleural fluid.   Small cell lung cancer (Tara Rodgers)  11/13/2020 Pathology Results   Right supraclavicular LN Biopsy  FINAL MICROSCOPIC DIAGNOSIS:   A. LYMPH NODE, RIGHT SUPRACLAVICULAR, NEEDLE CORE BIOPSY:  - Most consistent with small cell carcinoma.   COMMENT:   Tumor is limited in quantity, and partially necrotic.  TTF-1 and  Synaptophysin are positive.  Ki-67 proliferation index reaches 80-90%.  GATA-3, ER and p40 are negative.  The morphologic and immunophenotypic  characteristics are most suggestive of small cell carcinoma.  TTF-1  positive staining is compatible with origin from the lung; however, it  does not exclude origin from other entities.  Please note that distinct  nodal tissue is not identified in the submitted material.  Results  reported to Dr. [name] on 11/16/2020.  Dr. Melina Copa reviewed the case   11/13/2020 Cancer Staging   Staging form: Lung, AJCC 8th Edition - Clinical stage from 11/13/2020: Stage IV  (cTX, cN3, cM1) - Signed by [name], MD on 11/16/2020 Stage prefix: Initial diagnosis   11/13/2020 - 11/28/2020 Radiation Therapy   palliative radiation to [facility] with Dr Lisbeth Renshaw 11/13/20-11/28/20   11/16/2020 Initial Diagnosis   Small cell lung cancer ([place])   11/22/2020 -  Chemotherapy   First line etoposide D1-3, carboplatin and atezolizumab D1 q3weeks beginning 11/22/20.        11/29/2020 Procedure   PAC placed on 11/29/20   01/01/2021 Imaging   IMPRESSION: 1. Significant interval decrease in size of the right middle lobe lung mass and significant interval decrease in size of the mediastinal and right hilar lymph nodes. 2. Resolution of right supraclavicular and left axillary adenopathy. 3. Stable miliary pattern of pulmonary nodules. No new or progressive findings. 4. Stable diffuse sclerotic metastatic bone disease. No findings for progression. 5. Interval decrease in size of the pancreatic mass and adjacent lymph node. 6. Slightly smaller segment 7 liver lesion. No new or progressive findings. 7. Emphysema and aortic atherosclerosis.   Aortic Atherosclerosis (ICD10-I70.0) and Emphysema (ICD10-J43.9).        REVIEW OF SYSTEMS:   Constitutional: Denies fevers, chills Eyes: Denies blurriness of vision Ears, nose, mouth, throat, and face: Denies mucositis or sore throat Respiratory: Denies cough, dyspnea or wheezes Cardiovascular: Denies palpitation, chest discomfort Gastrointestinal: Reports severe dysphagia Skin: Denies abnormal skin rashes Lymphatics: Denies new lymphadenopathy or easy bruising Neurological:Denies numbness, tingling or new weaknesses Behavioral/Psych: Mood is stable, no new changes  Extremities: No lower extremity edema All other systems were reviewed with the patient and are negative.  I have reviewed the past medical history, past surgical history, social history and family history with  the patient and they are unchanged from previous  note.   PHYSICAL EXAMINATION: ECOG PERFORMANCE STATUS: 3 - Symptomatic, >50% confined to bed  Vitals:   01/17/21 0436 01/17/21 1443  BP: 132/71 (!) 150/84  Pulse: 98 (!) 104  Resp: 16 16  Temp: 97.9 F (36.6 C) 98.3 F (36.8 C)  SpO2: 100% 100%   Filed Weights   01/16/21 0816  Weight: 112 lb 7 oz (51 kg)    Intake/Output from previous day: 04/05 0701 - 04/06 0700 In: 4383.3 [P.O.:240; I.V.:3843.3; IV Piggyback:300] Out: 1700 [Urine:1700]  GENERAL: Chronically ill-appearing female, no distress SKIN: skin color, texture, turgor are normal, no rashes or significant lesions, no petechia or ecchymosis EYES: normal, Conjunctiva are pink and non-injected, sclera clear OROPHARYNX: Dry oral mucosa, White coating on tongue LUNGS: clear to auscultation and percussion with normal breathing effort HEART: regular rate & rhythm and no murmurs and no lower extremity edema ABDOMEN:abdomen soft, non-tender and normal bowel sounds NEURO: alert & oriented x 3 with fluent speech, no focal motor/sensory deficits  LABORATORY DATA:  I have reviewed the data as listed CMP Latest Ref Rng & Units 01/17/2021 01/16/2021 01/15/2021  Glucose 70 - 99 mg/dL 95 95 89  BUN 8 - 23 mg/dL '8 9 10  ' Creatinine 0.44 - 1.00 mg/dL 0.67 0.47 0.43(L)  Sodium 135 - 145 mmol/L 134(L) 134(L) 133(L)  Potassium 3.5 - 5.1 mmol/L 4.4 3.7 3.1(L)  Chloride 98 - 111 mmol/L 104 104 106  CO2 22 - 32 mmol/L 19(L) 20(L) 18(L)  Calcium 8.9 - 10.3 mg/dL 8.3(L) 8.4(L) 7.8(L)  Total Protein 6.5 - 8.1 g/dL 5.4(L) 5.5(L) 5.4(L)  Total Bilirubin 0.3 - 1.2 mg/dL 2.3(H) 2.5(H) 2.8(H)  Alkaline Phos 38 - 126 U/L 92 89 82  AST 15 - 41 U/L 41 52(H) 74(H)  ALT 0 - 44 U/L 215(H) 276(H) 338(H)    Lab Results  Component Value Date   WBC 0.2 (LL) 01/17/2021   HGB 8.2 (L) 01/17/2021   HCT 24.8 (L) 01/17/2021   MCV 82.7 01/17/2021   PLT 20 (LL) 01/17/2021   NEUTROABS 0.4 (LL) 01/15/2021    DG Chest 2 View  Result Date:  01/28/2021 CLINICAL DATA:  Dysphagia and chest pain with nausea and gagging as well as vomiting 3 weeks. History of metastatic small cell lung cancer with last chemotherapy 2 weeks ago. EXAM: CHEST - 2 VIEW COMPARISON:  11/13/2020 and CT 01/01/2021 FINDINGS: Right IJ Port-A-Cath has tip over the SVC. Lungs are adequately inflated without focal airspace consolidation or effusion. Cardiomediastinal silhouette is normal. Stable moderate sclerotic compression fractures over the thoracic and lumbar spine compared to 01/01/2021, although these are new compared to 10/26/2020. IMPRESSION: 1.  No acute cardiopulmonary disease. 2. Several moderate sclerotic compression fractures of the thoracic and lumbar spine new since January 2022 but unchanged from 01/01/2021 likely metastatic in origin. Electronically Signed   By: Marin Olp M.D.   On: 01/20/2021 14:58   CT CHEST ABDOMEN PELVIS W CONTRAST  Result Date: 01/02/2021 CLINICAL DATA:  Small cell lung cancer. History of chemotherapy and radiation therapy. EXAM: CT CHEST, ABDOMEN, AND PELVIS WITH CONTRAST TECHNIQUE: Multidetector CT imaging of the chest, abdomen and pelvis was performed following the standard protocol during bolus administration of intravenous contrast. CONTRAST:  180m OMNIPAQUE IOHEXOL 300 MG/ML  SOLN COMPARISON:  CT abdomen/pelvis 11/28/2020 and PET-CT 11/10/2020 FINDINGS: CT CHEST FINDINGS Cardiovascular: The heart is normal in size. No pericardial effusion. Stable tortuosity and calcification of the thoracic aorta  and stable scattered coronary artery calcifications. The right IJ Port-A-Cath is in good position without complicating features. Mediastinum/Nodes: Much improved mediastinal and right hilar adenopathy. Anterior mediastinal nodal mass measures 20 x 12 mm on image 23/2. This previously measured 4.1 x 3.5 cm. Subcarinal node measures 9.5 mm on image 27/2. This previously measured 20 mm. On the prior PET-CT there is a large right supraclavicular  nodal mass. I do not see any residual measurable lymphadenopathy. The hypermetabolic left axillary adenopathy seen on the prior PET-CT has resolved. The esophagus is grossly normal. Lungs/Pleura: The peripheral right middle lobe lung mass is much smaller. It measures approximately 4.2 x 2.0 cm on the prior CT scan and now measures approximately 2.3 x 1.0 cm. Persistent miliary pattern of pulmonary nodules throughout both lungs. No new lung lesions. No pleural effusions. Musculoskeletal: Stable appearing diffuse sclerotic T7 and T10 pathologic compression fractures are again noted. Do not see any obvious new or progressive metastatic bone disease. Metastatic disease CT ABDOMEN PELVIS FINDINGS Hepatobiliary: 1.7 x 1.4 cm segment 7 liver lesion previously measured approximately 2.3 x 1.9 cm. No new hepatic metastatic lesions. The gallbladder is unremarkable.  No common bile duct dilatation. Pancreas: The 17 mm pancreatic mass in the body tail junction region now measures approximately 7 mm. There was an adjacent lymph node which measured measured 9 mm and this is no longer identified. Spleen: Normal size.  No focal lesions. Adrenals/Urinary Tract: The adrenal glands and kidneys are unremarkable. No evidence of adrenal gland metastatic disease. The bladder is unremarkable. Stomach/Bowel: The stomach, duodenum, small bowel and colon are unremarkable. Vascular/Lymphatic: Stable atherosclerotic calcifications involving the aorta and branch vessels. No aneurysm or dissection. The major venous structures are patent. No mesenteric or retroperitoneal mass or lymphadenopathy. Reproductive: The uterus and ovaries are unremarkable. Other: No pelvic mass or free pelvic fluid collections. No inguinal mass or adenopathy. Musculoskeletal: Stable severe degenerative changes involving the left hip. Stable scattered sclerotic metastatic bone disease without findings for progression. IMPRESSION: 1. Significant interval decrease in size  of the right middle lobe lung mass and significant interval decrease in size of the mediastinal and right hilar lymph nodes. 2. Resolution of right supraclavicular and left axillary adenopathy. 3. Stable miliary pattern of pulmonary nodules. No new or progressive findings. 4. Stable diffuse sclerotic metastatic bone disease. No findings for progression. 5. Interval decrease in size of the pancreatic mass and adjacent lymph node. 6. Slightly smaller segment 7 liver lesion. No new or progressive findings. 7. Emphysema and aortic atherosclerosis. Aortic Atherosclerosis (ICD10-I70.0) and Emphysema (ICD10-J43.9). Electronically Signed   By: Marijo Sanes M.D.   On: 01/02/2021 12:28   US Abdomen Limited RUQ (LIVER/GB)  Result Date: 01/15/2021 CLINICAL DATA:  Elevated LFTs EXAM: ULTRASOUND ABDOMEN LIMITED RIGHT UPPER QUADRANT COMPARISON:  None. FINDINGS: Gallbladder: No gallstones or wall thickening visualized. No sonographic Murphy sign noted by sonographer. Common bile duct: Diameter: Normal at 1.5 mm Liver: Small subtly hypoechoic solid lesion in the posterosuperior aspect of the right liver measures 1.2 x 1.1 cm. Hepatic parenchymal echogenicity and echotexture within normal limits. Portal vein is patent on color Doppler imaging with normal direction of blood flow towards the liver. Other: None. IMPRESSION: 1. Small nonspecific solid 1.2 cm hypoechoic lesion in the posterosuperior aspect of the right liver. Differential considerations are broad and include both malignant and benign neoplastic processes. Recommend further evaluation with gadolinium-enhanced MRI of the abdomen. 2. Otherwise, unremarkable right upper quadrant ultrasound. Electronically Signed   By: Myrle Sheng  Laurence Ferrari M.D.   On: 01/15/2021 06:37    ASSESSMENT AND PLAN: 1.  Extensive stage small cell lung cancer 2.  Pancytopenia secondary to chemotherapy, worsening thrombocytopenia 3.  Dysphagia, odynophagia 4.  Dehydration, improved 5.   Symptomatic pyuria 6.  Transaminitis and hyperbilirubinemia 7.  Hypothyroidism 8.  History of breast cancer 9. DNR   -her Caney remains to be very low, I will give one dose granix today (she received pegfilgrastim last week after chemo) -Platelet dropped to 20 K today, no clinical bleeding, this is secondary to chemotherapy, will monitor clinically, and give platelet transfusion tomorrow if plt<15K or active bleeding -consider blood transfusion if Hg<7.5 -Liver enzymes has slightly improved, total bilirubin 2.3, will continue monitoring. -Continue supportive care, I will add on sulcrafate for her odynophagia -will f/u tomorrow   [name]  01/17/2021

## 2021-01-17 NOTE — Evaluation (Signed)
Occupational Therapy Evaluation Patient Details Name: Tara Rodgers MRN: [id number] DOB: 09-27-1946 Today's Date: 01/17/2021    History of Present Illness 75 y.o. female admitted with 3 weeks of constant, worsening, severe dysphagia, globus sensation, and odynophagia that has limited per oral intake severely to the point that she isn't able to tolerate any po and not able to take her medications. PMH of metastatic SCLC undergoing chemotherapy s/p XRT, transfusion-dependent anemia, thrombocytopenia, right breast CA s/p mastectomy 2016.   Clinical Impression   [name] is a 75 year old woman who presents with generalized weakness, decreased activity tolerance and impaired balance resulting in a decline in functional mobility and independence with ADLs. Patient is overall min assist for transfers and ADLs. Patient needing seated positioning predominantly for all tasks. Patient only able to pivot since February. Patient will benefit from skilled OT services while in hospital to improve deficits and learn compensatory strategies as needed in order improve functional abilities.    Follow Up Recommendations  Home health OT    Equipment Recommendations  None recommended by OT    Recommendations for Other Services       Precautions / Restrictions Precautions Precautions: Fall Restrictions Weight Bearing Restrictions: No      Mobility Bed Mobility Overal bed mobility: Needs Assistance Bed Mobility: Supine to Sit;Sit to Supine     Supine to sit: Supervision;HOB elevated Sit to supine: Supervision        Transfers Overall transfer level: Needs assistance   Transfers: Squat Pivot Transfers     Squat pivot transfers: Min assist     General transfer comment: Min assist for squat pivot to recliner and then back to bed.    Balance Overall balance assessment: Needs assistance Sitting-balance support: No upper extremity supported Sitting balance-Leahy Scale: Good                                      ADL either performed or assessed with clinical judgement   ADL Overall ADL's : Needs assistance/impaired Eating/Feeding: Independent   Grooming: Set up;Sitting;Bed level   Upper Body Bathing: Set up;Sitting;Bed level   Lower Body Bathing: Minimal assistance;Bed level;Sitting/lateral leans   Upper Body Dressing : Set up;Sitting   Lower Body Dressing: Set up;Sitting/lateral leans   Toilet Transfer: Minimal assistance;Squat-pivot;BSC   Toileting- Clothing Manipulation and Hygiene: Minimal assistance;Sitting/lateral lean               Vision Patient Visual Report: No change from baseline       Perception     Praxis      Pertinent Vitals/Pain Pain Assessment: No/denies pain     Hand Dominance Right   Extremity/Trunk Assessment Upper Extremity Assessment Upper Extremity Assessment: Generalized weakness   Lower Extremity Assessment Lower Extremity Assessment: Generalized weakness   Cervical / Trunk Assessment Cervical / Trunk Assessment: Normal   Communication Communication Communication: No difficulties   Cognition Arousal/Alertness: Awake/alert Behavior During Therapy: Flat affect Overall Cognitive Status: Within Functional Limits for tasks assessed                                     General Comments       Exercises     Shoulder Instructions      Home Living Family/patient expects to be discharged to:: Private residence Living Arrangements: Spouse/significant other  Available Help at Discharge: Available 24 hours/day Type of Home: House Home Access: Stairs to enter [employer] of Steps: 1   Home Layout: One level     Bathroom Shower/Tub: Walk-in shower         Home Equipment: Clinical cytogeneticist - 2 wheels;Wheelchair - manual;Bedside commode   Additional Comments: friend stays with her while her husband works      Prior Functioning/Environment Level of  Independence: Needs assistance  Gait / Transfers Assistance Needed: reports she just pivots to one chair to another since Feb, uses WC, hasn't walked since Feb ADL's / Homemaking Assistance Needed: Reports she can dress herself, has daughter assist with bathing. A "lady" that comes during the day to assist with home tasks and ADLs if needed.            OT Problem List: Decreased strength;Decreased activity tolerance;Impaired balance (sitting and/or standing)      OT Treatment/Interventions: Self-care/ADL training;Therapeutic exercise;DME and/or AE instruction;Energy conservation;Patient/family education;Balance training;Therapeutic activities    OT Goals(Current goals can be found in the care plan section) Acute Rehab OT Goals Patient Stated Goal: to get stronger OT Goal Formulation: With patient Time For Goal Achievement: 01/31/21 Potential to Achieve Goals: Fair  OT Frequency: Min 2X/week   Barriers to D/C:            Co-evaluation              AM-PAC OT "6 Clicks" Daily Activity     Outcome Measure Help from another person eating meals?: None Help from another person taking care of personal grooming?: A Little Help from another person toileting, which includes using toliet, bedpan, or urinal?: A Little Help from another person bathing (including washing, rinsing, drying)?: A Little Help from another person to put on and taking off regular upper body clothing?: A Little Help from another person to put on and taking off regular lower body clothing?: A Little 6 Click Score: 19   End of Session Nurse Communication:  (okay to see per RN)  Activity Tolerance: Patient limited by fatigue Patient left: in bed;with call bell/phone within reach;with bed alarm set  OT Visit Diagnosis: Muscle weakness (generalized) (M62.81)                Time: 0950-1008 OT Time Calculation (min): 18 min Charges:  OT General Charges $OT Visit: 1 Visit OT Evaluation $OT Eval Low  Complexity: 1 Low  Elfrieda Espino, OTR/L [place]  Office [phone] Pager: [place] 01/17/2021, 11:41 AM

## 2021-01-17 NOTE — Care Management Important Message (Signed)
Important Message  Patient Details IM Letter given to the Patient. Name: Tara Rodgers MRN: [id number] Date of Birth: [birth year]   Medicare Important Message Given:  Yes     Kerin Salen 01/17/2021, 4:13 PM

## 2021-01-18 DIAGNOSIS — D701 Agranulocytosis secondary to cancer chemotherapy: Secondary | ICD-10-CM | POA: Diagnosis not present

## 2021-01-18 DIAGNOSIS — T451X5A Adverse effect of antineoplastic and immunosuppressive drugs, initial encounter: Secondary | ICD-10-CM | POA: Diagnosis not present

## 2021-01-18 DIAGNOSIS — R1310 Dysphagia, unspecified: Secondary | ICD-10-CM | POA: Diagnosis not present

## 2021-01-18 DIAGNOSIS — C3490 Malignant neoplasm of unspecified part of unspecified bronchus or lung: Secondary | ICD-10-CM | POA: Diagnosis not present

## 2021-01-18 LAB — BASIC METABOLIC PANEL
Anion gap: 13 (ref 5–15)
BUN: 8 mg/dL (ref 8–23)
CO2: 18 mmol/L — ABNORMAL LOW (ref 22–32)
Calcium: 8 mg/dL — ABNORMAL LOW (ref 8.9–10.3)
Chloride: 97 mmol/L — ABNORMAL LOW (ref 98–111)
Creatinine, Ser: 0.56 mg/dL (ref 0.44–1.00)
GFR, Estimated: >60 mL/min (ref >60)
Glucose, Bld: 88 mg/dL (ref 70–99)
Potassium: 3.2 mmol/L — ABNORMAL LOW (ref 3.5–5.1)
Sodium: 128 mmol/L — ABNORMAL LOW (ref 135–145)

## 2021-01-18 LAB — CBC WITH DIFFERENTIAL/PLATELET
HCT: 22.1 % — ABNORMAL LOW (ref 36.0–46.0)
Hemoglobin: 7.5 g/dL — ABNORMAL LOW (ref 12.0–15.0)
MCH: 27.3 pg (ref 26.0–34.0)
MCHC: 33.9 g/dL (ref 30.0–36.0)
MCV: 80.4 fL (ref 80.0–100.0)
Platelets: 8 10*3/uL — CL (ref 150–400)
RBC: 2.75 MIL/uL — ABNORMAL LOW (ref 3.87–5.11)
RDW: 15.9 % — ABNORMAL HIGH (ref 11.5–15.5)
WBC: 0.1 10*3/uL — CL (ref 4.0–10.5)
nRBC: 0 % (ref 0.0–0.2)

## 2021-01-18 LAB — MAGNESIUM: Magnesium: 1.3 mg/dL — ABNORMAL LOW (ref 1.7–2.4)

## 2021-01-18 LAB — PREPARE RBC (CROSSMATCH)

## 2021-01-18 MED ORDER — TBO-FILGRASTIM 480 MCG/0.8ML ~~LOC~~ SOSY
480.0000 ug | PREFILLED_SYRINGE | Freq: Once | SUBCUTANEOUS | Status: AC
  Administered 2021-01-18: 480 ug via SUBCUTANEOUS
  Filled 2021-01-18: qty 0.8

## 2021-01-18 MED ORDER — MAGNESIUM SULFATE 2 GM/50ML IV SOLN
2.0000 g | Freq: Once | INTRAVENOUS | Status: AC
  Administered 2021-01-18: 2 g via INTRAVENOUS
  Filled 2021-01-18: qty 50

## 2021-01-18 MED ORDER — SODIUM CHLORIDE 0.9% IV SOLUTION: Freq: Once | INTRAVENOUS | Status: AC

## 2021-01-18 MED ORDER — POTASSIUM CHLORIDE 10 MEQ/100ML IV SOLN: 10.0000 meq | INTRAVENOUS | Status: DC

## 2021-01-18 MED ORDER — POTASSIUM CHLORIDE 10 MEQ/100ML IV SOLN
10.0000 meq | INTRAVENOUS | Status: AC
  Administered 2021-01-18 (×2): 10 meq via INTRAVENOUS
  Filled 2021-01-18 (×2): qty 100

## 2021-01-18 NOTE — TOC Initial Note (Signed)
Transition of Care [facility]) - Initial/Assessment Note    Patient Details  Name: Tara Rodgers MRN: [id number] Date of Birth: [birth year]  [facility] CM/SW Contact:    Lynnell Catalan, RN Phone Number: 01/18/2021, 11:37 AM  Clinical Narrative:                  Spoke with pt and daughter at bedside for dc planning. Pt declines SNF placement and states that she has family that will be with her 24hrs/day. Pt active with [facility] for home PT/RN. She would like to continue their services at DC. Will need HHPT/RN orders. Pt requesting 4 wheeled walker for home. Order received and [employer] liaison contacted to deliver to room.   Expected Discharge Plan: [place] Barriers to Discharge: Continued Medical Work up   Patient Goals and CMS Choice Patient states their goals for this hospitalization and ongoing recovery are:: To go home CMS Medicare.gov Compare Post Acute Care list provided to:: Patient Choice offered to / list presented to : Patient  Expected Discharge Plan and Services Expected Discharge Plan: [place]   Discharge Planning Services: CM Consult Post Acute Care Choice: [place] arrangements for the past 2 months: Single Family Home                 DME Arranged: Walker rolling with seat DME Agency: [facility] Date DME Agency Contacted: 01/18/21 Time DME Agency Contacted: [id number] Representative spoke with at DME Agency: [facility]: RN,PT [place] Agency: [place] ([place]) Date [place] Contacted: 01/18/21 Time HH Agency Contacted: 1000 Representative spoke with at [place]: Ramond Marrow  Prior Living Arrangements/Services Living arrangements for the past 2 months: [place] with:: Spouse Patient language and need for interpreter reviewed:: Yes Do you feel safe going back to the place where you live?: Yes      Need for Family Participation in Patient Care: Yes (Comment) Care giver support system  in place?: Yes (comment) Current home services: Home PT,Home RN    Activities of Daily Living Home Assistive Devices/Equipment: Cytogeneticist (specify type),Shower chair with back,Grab bars in shower ADL Screening (condition at time of admission) Patient's cognitive ability adequate to safely complete daily activities?: Yes Is the patient deaf or have difficulty hearing?: No Does the patient have difficulty seeing, even when wearing glasses/contacts?: No Does the patient have difficulty concentrating, remembering, or making decisions?: No Patient able to express need for assistance with ADLs?: Yes Does the patient have difficulty dressing or bathing?: No Independently performs ADLs?: No Communication: Independent Dressing (OT): Independent with device (comment) Grooming: Independent Feeding: Independent Bathing: Needs assistance Is this a change from baseline?: Pre-admission baseline Toileting: Needs assistance Is this a change from baseline?: Pre-admission baseline In/Out Bed: Needs assistance Is this a change from baseline?: Change from baseline, expected to last >3 days Walks in Home: Dependent (wheelchair) Is this a change from baseline?: Pre-admission baseline Does the patient have difficulty walking or climbing stairs?: Yes Weakness of Legs: Both Weakness of Arms/Hands: Both  Permission Sought/Granted Permission sought to share information with : Facility Art therapist granted to share information with : Yes, Verbal Permission Granted     Permission granted to share info w AGENCY: [facility]        Emotional Assessment Appearance:: Appears stated age Attitude/Demeanor/Rapport: Engaged Affect (typically observed): Calm Orientation: : Oriented to Self,Oriented to Place,Oriented to  Time,Oriented to Situation Alcohol / Substance Use:  Not Applicable    Admission diagnosis:  Dehydration [E86.0] Hypokalemia [E87.6] Dysphagia  [R13.10] Patient Active Problem List   Diagnosis Date Noted  . Malnutrition of moderate degree 01/16/2021  . Dysphagia 02/09/2021  . Port-A-Cath in place 12/04/2020  . Small cell lung cancer ([place]) 11/16/2020  . Goals of care, counseling/discussion 11/16/2020  . Sinus tachycardia 11/16/2019  . Left carotid bruit 11/16/2019  . Stiffness of right knee 04/01/2018  . Osteoarthritis of knee 03/13/2018  . History of total knee arthroplasty 03/13/2018  . Genetic testing 02/13/2015  . Abnormal chromosomal and genetic finding on antenatal screening mother 02/13/2015  . 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 Hypertensive disorder 09/19/2012  . History of malignant neoplasm of breast 09/19/2012  . Asthma 09/19/2012  . Allergic rhinitis 09/19/2012  . Gastroesophageal reflux disease 09/19/2012  . Anxiety 09/19/2012  . Hyperlipidemia 09/19/2012  . Hypothyroidism 09/19/2012  . Osteoporosis 09/19/2012   PCP:  Elby Showers, MD Pharmacy:   [place], [place] - [address] [place] [address] Phone: [phone] Fax: [phone]  [place] ([place]), [place] - [address] [place] [address] Phone: [phone] Fax: [place] [address] Phone: [phone] Fax: [phone]     Social Determinants of Health (SDOH) Interventions    Readmission Risk Interventions Readmission Risk Prevention Plan 01/18/2021  Transportation Screening Complete  PCP or Specialist Appt within 3-5 Days Complete  HRI or [place] Complete  Social Work Consult for [place] Planning/Counseling Complete  Palliative Care Screening Not Applicable  Medication Review Press photographer) Complete  Some recent data might be hidden

## 2021-01-18 NOTE — Progress Notes (Addendum)
HEMATOLOGY-ONCOLOGY PROGRESS NOTE  SUBJECTIVE: Tara Rodgers still has moderate dysphagia and odynophagia, has variable appetite, only sipping on liquids, and quite fatigued.  No fever or bleeding, no other new complaints.   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 [facility]) Staging form: Breast, AJCC 7th Edition - Clinical: Stage IIA (T2, N0, M0) - Signed by [name], MD on 02/08/2015 Laterality: Right Estrogen receptor status: Positive Progesterone receptor status: Positive HER2 status: Negative - Pathologic stage from 03/23/2015: Stage Unknown (T2, NX, cM0) - Unsigned  Small cell lung cancer ([place]) Staging form: Lung, AJCC 8th Edition - Clinical stage from 11/13/2020: Stage IV (cTX, cN3, cM1) - Signed by [name], MD on 11/16/2020 Stage prefix: Initial diagnosis     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) and adjuvant radiation ([name]).   12/13/2014 Mammogram   Right breast: possible asymmetry warranting further evaluation with spot compression views and possibly ultrasound   12/16/2014 Breast US   Right breast: no definitive abnormality within the superior aspect of the right breast to correspond with the mammographic finding.   01/10/2015 Breast MRI   Right breast: irregular rim enhancing mass within the superior right breast at 11:30 measuring 2.5 x 2.4 x 2.4 cm. No abnormal enhancement is identified within the skin of the superior right breast in the area of concern on mammogram.   01/12/2015 Breast US   Second look diagnostic mammogram and ultrasound showed a 2.3 cm mass in the upper midline right breast. No axillary adenopathy.   01/12/2015 Initial Biopsy   Right breast needle  core bx (upper midline): Invasive ductal carcinoma, grade 2, ER+ (100%), PR+ (77%), HER2/neu negative (ratio 1.15), Ki67 20%    01/30/2015 Procedure   Breast High/Moderate Risk panel ([facility]) reveals no clinically significant variant at ATM, BRCA1, BRCA2, CDH1, CHEK2, PALB2, PTEN, STK11, and TP53.   02/08/2015 Clinical Stage   Stage IIA (T2 N0)   03/23/2015 Definitive Surgery   Right mastectomy ([name]): invasive adenocarcinoma, grade 3, 2.9 cm, HER2/neu repeated, negative (ratio 1.23)   03/23/2015 Oncotype testing   Score: 8 (6% ROR). No chemotherapy [name]).   03/23/2015 Pathologic Stage   Stage IIA: pT2 pNx   05/04/2015 -  Anti-estrogen oral therapy   Letrozole 2.67m daily. Planned duration of therapy at least 5 years.    07/25/2015 Survivorship   Survivorship visit completed and copy of care plan provided to patient.   12/15/2015 Mammogram   IMPRESSION: No mammographic evidence of malignancy. A result letter of this screening mammogram will be mailed directly to the patient.   12/23/2016 Mammogram   IMPRESSION: No mammographic evidence of malignancy. A result letter of this screening mammogram will be mailed directly to the patient.    12/30/2017 Mammogram   12/30/2017 Mammogram IMPRESSION: No mammographic evidence of malignancy. A result letter of this screening mammogram will be mailed directly to the patient.   03/27/2020 Mammogram   IMPRESSION: No mammographic evidence of malignancy. A result letter of this screening mammogram will be mailed directly to the patient.   10/26/2020 Imaging   CT chest IMPRESSION: 1. Bulky mediastinal, thoracic inlet, right supraclavicular, and left axillary lymphadenopathy consistent with metastatic disease. Associated numerous bilateral pulmonary nodules and masses, most prominently in the right middle lobe  with dominant 4.1 cm peripheral mass lesion. 2. 16 mm rim enhancing lesion posterior right liver consistent with metastatic  involvement. 3. 13 mm hypoenhancing lesion in the tail of pancreas. Primary neoplasm or metastatic involvement could have this appearance. 4. Multiple nodules in the upper abdomen concerning for peritoneal/mesenteric metastatic disease. 5. Small right and tiny left pleural effusions with bibasilar atelectasis. 6. Aortic Atherosclerosis (ICD10-I70.0).   11/10/2020 PET scan   IMPRESSION: 1. Extensive hypermetabolic multi organ metastatic disease. 2. Bulky intensely hypermetabolic RIGHT supraclavicular, mediastinal and LEFT axillary adenopathy. 3. Hypermetabolic pulmonary mass in the RIGHT upper lobe with additional hypermetabolic nodules. 4. Hypermetabolic solitary hepatic metastasis. 5. Hypermetabolic lesion within the pancreas is also favored metastatic lesion. 6. Multifocal hypermetabolic skeletal metastasis. 7. Bilateral pleural effusions.   11/13/2020 Procedure   Thoracentesis  IMPRESSION: Successful ultrasound guided right thoracentesis yielding 200 mL of pleural fluid.   Small cell lung cancer ([place])  11/13/2020 Pathology Results   Right supraclavicular LN Biopsy  FINAL MICROSCOPIC DIAGNOSIS:   A. LYMPH NODE, RIGHT SUPRACLAVICULAR, NEEDLE CORE BIOPSY:  - Most consistent with small cell carcinoma.   COMMENT:   Tumor is limited in quantity, and partially necrotic.  TTF-1 and  Synaptophysin are positive.  Ki-67 proliferation index reaches 80-90%.  GATA-3, ER and p40 are negative.  The morphologic and immunophenotypic  characteristics are most suggestive of small cell carcinoma.  TTF-1  positive staining is compatible with origin from the lung; however, it  does not exclude origin from other entities.  Please note that distinct  nodal tissue is not identified in the submitted material.  Results  reported to Dr. [name] on 11/16/2020.  Dr. Melina Copa reviewed the case   11/13/2020 Cancer Staging   Staging form: Lung, AJCC 8th Edition - Clinical stage from 11/13/2020: Stage IV  (cTX, cN3, cM1) - Signed by [name], MD on 11/16/2020 Stage prefix: Initial diagnosis   11/13/2020 - 11/28/2020 Radiation Therapy   palliative radiation to [facility] with Dr Lisbeth Renshaw 11/13/20-11/28/20   11/16/2020 Initial Diagnosis   Small cell lung cancer ([place])   11/22/2020 -  Chemotherapy   First line etoposide D1-3, carboplatin and atezolizumab D1 q3weeks beginning 11/22/20.        11/29/2020 Procedure   PAC placed on 11/29/20   01/01/2021 Imaging   IMPRESSION: 1. Significant interval decrease in size of the right middle lobe lung mass and significant interval decrease in size of the mediastinal and right hilar lymph nodes. 2. Resolution of right supraclavicular and left axillary adenopathy. 3. Stable miliary pattern of pulmonary nodules. No new or progressive findings. 4. Stable diffuse sclerotic metastatic bone disease. No findings for progression. 5. Interval decrease in size of the pancreatic mass and adjacent lymph node. 6. Slightly smaller segment 7 liver lesion. No new or progressive findings. 7. Emphysema and aortic atherosclerosis.   Aortic Atherosclerosis (ICD10-I70.0) and Emphysema (ICD10-J43.9).        REVIEW OF SYSTEMS:   Constitutional: Denies fevers, chills Eyes: Denies blurriness of vision Ears, nose, mouth, throat, and face: Denies mucositis or sore throat Respiratory: Denies cough, dyspnea or wheezes Cardiovascular: Denies palpitation, chest discomfort Gastrointestinal: Reports severe dysphagia Skin: Denies abnormal skin rashes Lymphatics: Denies new lymphadenopathy or easy bruising Neurological:Denies numbness, tingling or new weaknesses Behavioral/Psych: Mood is stable, no new changes  Extremities: No lower extremity edema All other systems were reviewed with the patient and are negative.  I have reviewed the past medical history, past surgical history, social history and family history with the  patient and they are unchanged from previous  note.   PHYSICAL EXAMINATION: ECOG PERFORMANCE STATUS: 3 - Symptomatic, >50% confined to bed  Vitals:   01/17/21 2057 01/18/21 0430  BP: (!) 164/81 139/83  Pulse: (!) 107 (!) 109  Resp: 16 16  Temp: 98.9 F (37.2 C) 99 F (37.2 C)  SpO2: 100% 100%   Filed Weights   01/16/21 0816  Weight: 51 kg    Intake/Output from previous day: 04/06 0701 - 04/07 0700 In: 1831.4 [P.O.:600; I.V.:1231.4] Out: 2000 [Urine:2000]  GENERAL: Chronically ill-appearing female, no distress SKIN: skin color, texture, turgor are normal, no rashes or significant lesions, no petechia or ecchymosis EYES: normal, Conjunctiva are pink and non-injected, sclera clear OROPHARYNX: Dry oral mucosa, White coating on tongue LUNGS: clear to auscultation and percussion with normal breathing effort HEART: regular rate & rhythm and no murmurs and no lower extremity edema ABDOMEN:abdomen soft, non-tender and normal bowel sounds NEURO: alert & oriented x 3 with fluent speech, no focal motor/sensory deficits  LABORATORY DATA:  I have reviewed the data as listed CMP Latest Ref Rng & Units 01/18/2021 01/17/2021 01/16/2021  Glucose 70 - 99 mg/dL 88 95 95  BUN 8 - 23 mg/dL '8 8 9  ' Creatinine 0.44 - 1.00 mg/dL 0.56 0.67 0.47  Sodium 135 - 145 mmol/L 128(L) 134(L) 134(L)  Potassium 3.5 - 5.1 mmol/L 3.2(L) 4.4 3.7  Chloride 98 - 111 mmol/L 97(L) 104 104  CO2 22 - 32 mmol/L 18(L) 19(L) 20(L)  Calcium 8.9 - 10.3 mg/dL 8.0(L) 8.3(L) 8.4(L)  Total Protein 6.5 - 8.1 g/dL - 5.4(L) 5.5(L)  Total Bilirubin 0.3 - 1.2 mg/dL - 2.3(H) 2.5(H)  Alkaline Phos 38 - 126 U/L - 92 89  AST 15 - 41 U/L - 41 52(H)  ALT 0 - 44 U/L - 215(H) 276(H)    Lab Results  Component Value Date   WBC 0.1 (LL) 01/18/2021   HGB 7.5 (L) 01/18/2021   HCT 22.1 (L) 01/18/2021   MCV 80.4 01/18/2021   PLT 8 (LL) 01/18/2021   NEUTROABS 0.4 (LL) 01/15/2021    DG Chest 2 View  Result Date: 01/30/2021 CLINICAL DATA:  Dysphagia and chest pain with nausea and  gagging as well as vomiting 3 weeks. History of metastatic small cell lung cancer with last chemotherapy 2 weeks ago. EXAM: CHEST - 2 VIEW COMPARISON:  11/13/2020 and CT 01/01/2021 FINDINGS: Right IJ Port-A-Cath has tip over the SVC. Lungs are adequately inflated without focal airspace consolidation or effusion. Cardiomediastinal silhouette is normal. Stable moderate sclerotic compression fractures over the thoracic and lumbar spine compared to 01/01/2021, although these are new compared to 10/26/2020. IMPRESSION: 1.  No acute cardiopulmonary disease. 2. Several moderate sclerotic compression fractures of the thoracic and lumbar spine new since January 2022 but unchanged from 01/01/2021 likely metastatic in origin. Electronically Signed   By: Marin Olp M.D.   On: 01/19/2021 14:58   CT CHEST ABDOMEN PELVIS W CONTRAST  Result Date: 01/02/2021 CLINICAL DATA:  Small cell lung cancer. History of chemotherapy and radiation therapy. EXAM: CT CHEST, ABDOMEN, AND PELVIS WITH CONTRAST TECHNIQUE: Multidetector CT imaging of the chest, abdomen and pelvis was performed following the standard protocol during bolus administration of intravenous contrast. CONTRAST:  182m OMNIPAQUE IOHEXOL 300 MG/ML  SOLN COMPARISON:  CT abdomen/pelvis 11/28/2020 and PET-CT 11/10/2020 FINDINGS: CT CHEST FINDINGS Cardiovascular: The heart is normal in size. No pericardial effusion. Stable tortuosity and calcification of the thoracic aorta and stable scattered coronary artery calcifications.  The right IJ Port-A-Cath is in good position without complicating features. Mediastinum/Nodes: Much improved mediastinal and right hilar adenopathy. Anterior mediastinal nodal mass measures 20 x 12 mm on image 23/2. This previously measured 4.1 x 3.5 cm. Subcarinal node measures 9.5 mm on image 27/2. This previously measured 20 mm. On the prior PET-CT there is a large right supraclavicular nodal mass. I do not see any residual measurable lymphadenopathy.  The hypermetabolic left axillary adenopathy seen on the prior PET-CT has resolved. The esophagus is grossly normal. Lungs/Pleura: The peripheral right middle lobe lung mass is much smaller. It measures approximately 4.2 x 2.0 cm on the prior CT scan and now measures approximately 2.3 x 1.0 cm. Persistent miliary pattern of pulmonary nodules throughout both lungs. No new lung lesions. No pleural effusions. Musculoskeletal: Stable appearing diffuse sclerotic T7 and T10 pathologic compression fractures are again noted. Do not see any obvious new or progressive metastatic bone disease. Metastatic disease CT ABDOMEN PELVIS FINDINGS Hepatobiliary: 1.7 x 1.4 cm segment 7 liver lesion previously measured approximately 2.3 x 1.9 cm. No new hepatic metastatic lesions. The gallbladder is unremarkable.  No common bile duct dilatation. Pancreas: The 17 mm pancreatic mass in the body tail junction region now measures approximately 7 mm. There was an adjacent lymph node which measured measured 9 mm and this is no longer identified. Spleen: Normal size.  No focal lesions. Adrenals/Urinary Tract: The adrenal glands and kidneys are unremarkable. No evidence of adrenal gland metastatic disease. The bladder is unremarkable. Stomach/Bowel: The stomach, duodenum, small bowel and colon are unremarkable. Vascular/Lymphatic: Stable atherosclerotic calcifications involving the aorta and branch vessels. No aneurysm or dissection. The major venous structures are patent. No mesenteric or retroperitoneal mass or lymphadenopathy. Reproductive: The uterus and ovaries are unremarkable. Other: No pelvic mass or free pelvic fluid collections. No inguinal mass or adenopathy. Musculoskeletal: Stable severe degenerative changes involving the left hip. Stable scattered sclerotic metastatic bone disease without findings for progression. IMPRESSION: 1. Significant interval decrease in size of the right middle lobe lung mass and significant interval  decrease in size of the mediastinal and right hilar lymph nodes. 2. Resolution of right supraclavicular and left axillary adenopathy. 3. Stable miliary pattern of pulmonary nodules. No new or progressive findings. 4. Stable diffuse sclerotic metastatic bone disease. No findings for progression. 5. Interval decrease in size of the pancreatic mass and adjacent lymph node. 6. Slightly smaller segment 7 liver lesion. No new or progressive findings. 7. Emphysema and aortic atherosclerosis. Aortic Atherosclerosis (ICD10-I70.0) and Emphysema (ICD10-J43.9). Electronically Signed   By: Marijo Sanes M.D.   On: 01/02/2021 12:28   US Abdomen Limited RUQ (LIVER/GB)  Result Date: 01/15/2021 CLINICAL DATA:  Elevated LFTs EXAM: ULTRASOUND ABDOMEN LIMITED RIGHT UPPER QUADRANT COMPARISON:  None. FINDINGS: Gallbladder: No gallstones or wall thickening visualized. No sonographic Murphy sign noted by sonographer. Common bile duct: Diameter: Normal at 1.5 mm Liver: Small subtly hypoechoic solid lesion in the posterosuperior aspect of the right liver measures 1.2 x 1.1 cm. Hepatic parenchymal echogenicity and echotexture within normal limits. Portal vein is patent on color Doppler imaging with normal direction of blood flow towards the liver. Other: None. IMPRESSION: 1. Small nonspecific solid 1.2 cm hypoechoic lesion in the posterosuperior aspect of the right liver. Differential considerations are broad and include both malignant and benign neoplastic processes. Recommend further evaluation with gadolinium-enhanced MRI of the abdomen. 2. Otherwise, unremarkable right upper quadrant ultrasound. Electronically Signed   By: Jacqulynn Cadet M.D.   On: 01/15/2021  06:37    ASSESSMENT AND PLAN: 1.  Extensive stage small cell lung cancer 2.  Pancytopenia secondary to chemotherapy, worsening thrombocytopenia 3.  Dysphagia, odynophagia 4.  Dehydration, improved 5.  Symptomatic pyuria 6.  Transaminitis and hyperbilirubinemia 7.   Hypothyroidism 8.  History of breast cancer 9. DNR   -WBC continues to remain very low despite receiving Granix yesterday and Neulasta in our office last week.  We will give an additional dose of Granix today. -Platelet dropped to 8 K today, no clinical bleeding, this is secondary to chemotherapy.  We will transfuse 1 unit at least today.  Recommend platelet transfusion for platelet count less than 15,000. -Hemoglobin 7.5 and will give 1 unit PRBCs today. -Liver enzymes has slightly improved as of 4/6, total bilirubin 2.3, will continue monitoring.  Not checked today. -We will ask for speech therapy evaluation for possible modified barium swallow. -Continue supportive care -will f/u tomorrow   Mikey Bussing  01/18/2021   Addendum  I have seen the patient, examined her. I agree with the assessment and and plan and have edited the notes.   Lab reviewed, she needed blood, plt transfusion today, and we will give second dose GCSF. She may need swallow evaluation. Daughter in law at bed side and had many questions which I all answered to the best of my ability. Will f/u closely.  [name]  01/18/2021

## 2021-01-18 NOTE — Progress Notes (Signed)
SLP Cancellation Note  Patient Details Name: Tara Rodgers MRN: [id number] DOB: [birth year]   Cancelled treatment:       Reason Eval/Treat Not Completed: Other (comment) (SLP received order for swallow evaluation, will see pt next date in late am.  Note pt being treated for potential esophagitis and candidasis.  Thanks for this order.)   Macario Golds 01/18/2021, 4:35 PM

## 2021-01-18 NOTE — Progress Notes (Signed)
Pharmacy Antibiotic Note  CHEVI LIM is a 75 y.o. female admitted on 01/31/2021 with possible esophageal candidiasis.  Pharmacy has been consulted for fluconazole dosing.   Pt is  neutropenic patient with possible  Candidal esophagitis. Gi unable to currently do perform EGD due to low blood counts .    Plan: Continue Fluconazole  200 mg IV daily   Monitor for completion of CBC improvements, possibility of EGD       Height: 4\' 11"  (149.9 cm) Weight: 51 kg (112 lb 7 oz) IBW/kg (Calculated) : 43.2  Temp (24hrs), Avg:98.7 F (37.1 C), Min:98.3 F (36.8 C), Max:99 F (37.2 C)  Recent Labs  Lab 02/04/2021 1443 01/15/21 0552 01/15/21 1114 01/16/21 0547 01/17/21 0446 01/18/21 0446  WBC 4.5 0.8* 0.6* 0.2* 0.2* 0.1*  CREATININE 0.84 0.43*  --  0.47 0.67 0.56    Estimated Creatinine Clearance: 42.7 mL/min (by C-G formula based on SCr of 0.56 mg/dL).    Allergies  Allergen Reactions  . Fosamax [Alendronate Sodium] Other (See Comments)    Aching all over     Thank you for allowing pharmacy to be a part of this patient's care.   Royetta Asal, PharmD, BCPS 01/18/2021 9:46 AM

## 2021-01-18 NOTE — Progress Notes (Signed)
PROGRESS NOTE  Tara Rodgers  DOB: 03/31/1946  PCP: Elby Showers, MD LKT:[id number]  DOA: 01/16/2021  LOS: 3 days   Chief Complaint  Patient presents with  . Emesis  . Nausea  . Dysphagia    Brief narrative: Tara Rodgers is a 75 y.o. female with PMH significant for metastatic SCLC undergoing chemotherapy s/p XRT, transfusion-dependent anemia, thrombocytopenia, right breast CA s/p mastectomy 2016 on antiestrogen therapy. Patient presented to the ED on 01/22/2021 with complaint of 3 weeks of constant, worsening, severe dysphagia, globus sensation, and odynophagia that has limited per oral intake severely to the point that she was not able to tolerate any po and not able to take her medications.  In the ED, she appeared dehydrated, tachycardic to 130s, afebrile, normotensive, benign abdominal exam Labs with ketones in urine, potassium low at 2.9, metabolic acidosis, LFTs elevated.   Patient was admitted to hospitalist service GI consultation was obtained. EGD were tentatively planned but could not be done because of subsequent pancytopenia.  Subjective: Patient was seen and examined this morning. Pleasant elderly Caucasian female.  Sitting up in bed.  Still complains of dysphagia. Cell counts not improving. Daughter at bedside.  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 -Gradually with nystatin, diflucan IV ordered.  Yesterday, added Magic mouthwash and Carafate.  Patient continues to have dysphagia.  On clear liquid diet at this time. -GI consulted, recommended empiric infectious Tx. IF cytopenias improve and symptoms remain, would reach back out to them for EGD.  Pancytopenia -CBC being monitored.  On today's lab, WBC count down to 0.1, hemoglobin down to 7.5 and platelet down to 8. -Oncology following.  Given a dose of Granix today.  1 unit of PRBC and 1 unit of platelet given as well. Recent Labs  Lab 01/15/21 0552 01/15/21 1114  01/16/21 0547 01/17/21 0446 01/18/21 0446  WBC 0.8* 0.6* 0.2* 0.2* 0.1*  NEUTROABS  --  0.4*  --   --   --   HGB 8.2* 8.0* 8.4* 8.2* 7.5*  HCT 24.8* 24.4* 25.2* 24.8* 22.1*  MCV 83.8 83.8 83.2 82.7 80.4  PLT 53* 42* 36* 20* 8*   Moderate protein calorie malnutrition -Supplement protein as much as able  Hypokalemia/hypomagnesemia -Potassium and magnesium level low this morning.  IV replacement given. Recent Labs  Lab 01/21/2021 1443 01/15/21 0552 01/16/21 0547 01/17/21 0446 01/18/21 0446  K 2.9* 3.1* 3.7 4.4 3.2*  MG 1.7  --  1.6*  --  1.3*   Stage IV small cell lung cancer -Seen on PET 1/28 started on etoposide, carboplatin and atezolizumab 11/22/2020, s/p palliative mediastinal XRT 1/31 - 2/15.  -Oncology, Dr. [name] following  Solid 1.2 cm hypoechoic lesion in the posterosuperior aspect of the right liver: Nonspecific.  -Defer to oncology  LFT elevation -Multifactorial, improved. Oncology suspects metastatic disease and chemotherapy. RUQ U/S without ductal dilatation -Monitor.  Recent Labs  Lab 02/03/2021 1443 01/15/21 0552 01/16/21 0547 01/17/21 0446  AST 113* 74* 52* 41  ALT 479* 338* 276* 215*  ALKPHOS 103 82 89 92  BILITOT 2.7* 2.8* 2.5* 2.3*  PROT 6.4* 5.4* 5.5* 5.4*  ALBUMIN 3.8 3.1* 3.3* 3.2*   HTN, tachycardia - Continue IV metoprolol, hopefully could change back to po formulation of medications soon.  Hypothyroidism - Continue synthroid to IV for now. Has had good response following TSH.  History of breast CA -On antiestrogen which we will hold while unable to take po.  History of covid-19 infection and s/p 2 doses  of vaccine -Recommended 3rd dose.  Impaired mobility -PT eval obtained.  SNF recommended.  Mobility: PT eval obtained Code Status:   Code Status: DNR  Nutritional status: Body mass index is 22.71 kg/m. Nutrition Problem: Moderate Malnutrition Etiology: chronic illness,cancer and cancer related treatments Signs/Symptoms:  energy intake < or equal to 75% for > or equal to 1 month,percent weight loss,mild fat depletion,mild muscle depletion Diet Order            Diet clear liquid Room service appropriate? Yes; Fluid consistency: Thin  Diet effective now                 DVT prophylaxis: SCDs Start: 01/26/2021 1939   Antimicrobials:  IV Rocephin, fluconazole Fluid: LR at 100 mill per hour Consultants: GI, oncology Family Communication:  None at bedside  Status is: Inpatient  Remains inpatient appropriate because: Continues to have dysphagia, pancytopenia  Dispo: The patient is from: Home              Anticipated d/c is to: Home with services.  PT recommended SNF which patient does not prefer.              Patient currently is not medically stable to d/c.   Difficult to place patient No   Infusions:  . cefTRIAXone (ROCEPHIN)  IV 1 g (01/18/21 1208)  . fluconazole (DIFLUCAN) IV 200 mg (01/18/21 1051)  . lactated ringers with kcl 100 mL/hr at 01/18/21 2585    Scheduled Meds: . sodium chloride   Intravenous Once  . Chlorhexidine Gluconate Cloth  6 each Topical Daily  . feeding supplement  1 Container Oral TID BM  . fentaNYL  1 patch Transdermal Q72H  . fentaNYL (SUBLIMAZE) injection  12.5 mcg Intravenous Once  . levothyroxine  50 mcg Intravenous Daily  . mouth rinse  15 mL Mouth Rinse BID  . metoprolol tartrate  5 mg Intravenous Q6H  . nystatin  5 mL Oral QID  . pantoprazole (PROTONIX) IV  40 mg Intravenous Q24H  . sodium chloride flush  10-40 mL Intracatheter Q12H  . sodium chloride flush  3 mL Intravenous Q12H  . sucralfate  1 g Oral TID WC & HS  . Tbo-filgastrim (GRANIX) SQ  480 mcg Subcutaneous ONCE-1800    Antimicrobials: Anti-infectives (From admission, onward)   Start     Dose/Rate Route Frequency Ordered Stop   01/16/21 1000  fluconazole (DIFLUCAN) IVPB 200 mg        200 mg 100 mL/hr over 60 Minutes Intravenous Daily 01/15/21 1024     01/15/21 1300  cefTRIAXone (ROCEPHIN) 1 g in  sodium chloride 0.9 % 100 mL IVPB        1 g 200 mL/hr over 30 Minutes Intravenous Every 24 hours 01/15/21 1210     01/15/21 1100  fluconazole (DIFLUCAN) IVPB 400 mg        400 mg 100 mL/hr over 120 Minutes Intravenous  Once 01/15/21 1024 01/15/21 1326      PRN meds: acetaminophen **OR** [DISCONTINUED] acetaminophen, LORazepam, magic mouthwash w/lidocaine, ondansetron (ZOFRAN) IV, sodium chloride flush   Objective: Vitals:   01/17/21 2057 01/18/21 0430  BP: (!) 164/81 139/83  Pulse: (!) 107 (!) 109  Resp: 16 16  Temp: 98.9 F (37.2 C) 99 F (37.2 C)  SpO2: 100% 100%    Intake/Output Summary (Last 24 hours) at 01/18/2021 1314 Last data filed at 01/18/2021 0629 Gross per 24 hour  Intake 1351.36 ml  Output 2000 ml  Net -648.[place]  ml   Filed Weights   01/16/21 0816  Weight: 51 kg   Weight change:  Body mass index is 22.71 kg/m.   Physical Exam: General exam: Pleasant elderly Caucasian female.  Frustrated because of persistent dysphagia Skin: No rashes, lesions or ulcers. HEENT: Atraumatic, normocephalic, no obvious bleeding.  No oral thrush on exam Lungs: Clear to auscultation bilaterally CVS: Regular rate and rhythm GI/Abd soft, nontender, nondistended, bowel sound present CNS: Alert, awake, oriented x3 Psychiatry: Depressed look Extremities: No pedal edema, no calf tenderness  Data Review: I have personally reviewed the laboratory data and studies available.  Recent Labs  Lab 01/15/21 0552 01/15/21 1114 01/16/21 0547 01/17/21 0446 01/18/21 0446  WBC 0.8* 0.6* 0.2* 0.2* 0.1*  NEUTROABS  --  0.4*  --   --   --   HGB 8.2* 8.0* 8.4* 8.2* 7.5*  HCT 24.8* 24.4* 25.2* 24.8* 22.1*  MCV 83.8 83.8 83.2 82.7 80.4  PLT 53* 42* 36* 20* 8*   Recent Labs  Lab 01/22/2021 1443 01/15/21 0552 01/16/21 0547 01/17/21 0446 01/18/21 0446  NA 136 133* 134* 134* 128*  K 2.9* 3.1* 3.7 4.4 3.2*  CL 106 106 104 104 97*  CO2 17* 18* 20* 19* 18*  GLUCOSE 113* 89 95 95 88  BUN 17  10 9 8 8   CREATININE 0.84 0.43* 0.47 0.67 0.56  CALCIUM 8.7* 7.8* 8.4* 8.3* 8.0*  MG 1.7  --  1.6*  --  1.3*    F/u labs ordered Unresulted Labs (From admission, onward)          Start     Ordered   01/18/21 0500  CBC with Differential/Platelet  Daily,   R     Question:  Specimen collection method  Answer:  Unit=Unit collect   01/17/21 1443   01/18/21 7588  Basic metabolic panel  Daily,   R     Question:  Specimen collection method  Answer:  Unit=Unit collect   01/17/21 1443          Signed, Terrilee Croak, MD [facility] 01/18/2021

## 2021-01-19 DIAGNOSIS — R1310 Dysphagia, unspecified: Secondary | ICD-10-CM | POA: Diagnosis not present

## 2021-01-19 DIAGNOSIS — L8990 Pressure ulcer of unspecified site, unspecified stage: Secondary | ICD-10-CM | POA: Insufficient documentation

## 2021-01-19 LAB — TYPE AND SCREEN
ABO/RH(D): A NEG
Antibody Screen: NEGATIVE
Unit division: 0

## 2021-01-19 LAB — PREPARE PLATELET PHERESIS: Unit division: 0

## 2021-01-19 LAB — HEPATIC FUNCTION PANEL
ALT: 109 U/L — ABNORMAL HIGH (ref 0–44)
AST: 20 U/L (ref 15–41)
Albumin: 3.1 g/dL — ABNORMAL LOW (ref 3.5–5.0)
Alkaline Phosphatase: 93 U/L (ref 38–126)
Bilirubin, Direct: 0.6 mg/dL — ABNORMAL HIGH (ref 0.0–0.2)
Indirect Bilirubin: 1.1 mg/dL — ABNORMAL HIGH (ref 0.3–0.9)
Total Bilirubin: 1.7 mg/dL — ABNORMAL HIGH (ref 0.3–1.2)
Total Protein: 5.7 g/dL — ABNORMAL LOW (ref 6.5–8.1)

## 2021-01-19 LAB — CBC WITH DIFFERENTIAL/PLATELET
Abs Immature Granulocytes: 0.04 10*3/uL (ref 0.00–0.07)
Basophils Absolute: 0 10*3/uL (ref 0.0–0.1)
Basophils Relative: 2 %
Eosinophils Absolute: 0 10*3/uL (ref 0.0–0.5)
Eosinophils Relative: 0 %
HCT: 30 % — ABNORMAL LOW (ref 36.0–46.0)
Hemoglobin: 10.4 g/dL — ABNORMAL LOW (ref 12.0–15.0)
Immature Granulocytes: 10 %
Lymphocytes Relative: 32 %
Lymphs Abs: 0.1 10*3/uL — ABNORMAL LOW (ref 0.7–4.0)
MCH: 27.7 pg (ref 26.0–34.0)
MCHC: 34.7 g/dL (ref 30.0–36.0)
MCV: 80 fL (ref 80.0–100.0)
Monocytes Absolute: 0.2 10*3/uL (ref 0.1–1.0)
Monocytes Relative: 36 %
Neutro Abs: 0.1 10*3/uL — CL (ref 1.7–7.7)
Neutrophils Relative %: 20 %
Platelets: 23 10*3/uL — CL (ref 150–400)
RBC: 3.75 MIL/uL — ABNORMAL LOW (ref 3.87–5.11)
RDW: 14.6 % (ref 11.5–15.5)
WBC: 0.4 10*3/uL — CL (ref 4.0–10.5)
nRBC: 0 % (ref 0.0–0.2)

## 2021-01-19 LAB — BASIC METABOLIC PANEL
Anion gap: 14 (ref 5–15)
BUN: 7 mg/dL — ABNORMAL LOW (ref 8–23)
CO2: 20 mmol/L — ABNORMAL LOW (ref 22–32)
Calcium: 8.1 mg/dL — ABNORMAL LOW (ref 8.9–10.3)
Chloride: 98 mmol/L (ref 98–111)
Creatinine, Ser: 0.51 mg/dL (ref 0.44–1.00)
GFR, Estimated: >60 mL/min (ref >60)
Glucose, Bld: 94 mg/dL (ref 70–99)
Potassium: 3.1 mmol/L — ABNORMAL LOW (ref 3.5–5.1)
Sodium: 132 mmol/L — ABNORMAL LOW (ref 135–145)

## 2021-01-19 LAB — BPAM PLATELET PHERESIS
Blood Product Expiration Date: 202204092359
ISSUE DATE / TIME: 202204071717
Unit Type and Rh: 6200

## 2021-01-19 LAB — BPAM RBC
Blood Product Expiration Date: 202205052359
ISSUE DATE / TIME: 202204071333
Unit Type and Rh: 600

## 2021-01-19 MED ORDER — LIP MEDEX EX OINT
TOPICAL_OINTMENT | CUTANEOUS | Status: AC
  Filled 2021-01-19: qty 7

## 2021-01-19 MED ORDER — POTASSIUM CHLORIDE 10 MEQ/100ML IV SOLN
10.0000 meq | INTRAVENOUS | Status: AC
  Administered 2021-01-19 (×2): 10 meq via INTRAVENOUS
  Filled 2021-01-19 (×2): qty 100

## 2021-01-19 NOTE — Progress Notes (Addendum)
HEMATOLOGY-ONCOLOGY PROGRESS NOTE  SUBJECTIVE: Resting quietly.  Still has ongoing moderate dysphagia and odynophagia.  No bleeding.  Remains afebrile.   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 [facility]) Staging form: Breast, AJCC 7th Edition - Clinical: Stage IIA (T2, N0, M0) - Signed by [name], MD on 02/08/2015 Laterality: Right Estrogen receptor status: Positive Progesterone receptor status: Positive HER2 status: Negative - Pathologic stage from 03/23/2015: Stage Unknown (T2, NX, cM0) - Unsigned  Small cell lung cancer ([place]) Staging form: Lung, AJCC 8th Edition - Clinical stage from 11/13/2020: Stage IV (cTX, cN3, cM1) - Signed by [name], MD on 11/16/2020 Stage prefix: Initial diagnosis     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) and adjuvant radiation ([name]).   12/13/2014 Mammogram   Right breast: possible asymmetry warranting further evaluation with spot compression views and possibly ultrasound   12/16/2014 Breast US   Right breast: no definitive abnormality within the superior aspect of the right breast to correspond with the mammographic finding.   01/10/2015 Breast MRI   Right breast: irregular rim enhancing mass within the superior right breast at 11:30 measuring 2.5 x 2.4 x 2.4 cm. No abnormal enhancement is identified within the skin of the superior right breast in the area of concern on mammogram.   01/12/2015 Breast US   Second look diagnostic mammogram and ultrasound showed a 2.3 cm mass in the upper midline right breast. No axillary adenopathy.   01/12/2015 Initial Biopsy   Right breast needle core bx (upper midline): Invasive ductal carcinoma, grade 2, ER+  (100%), PR+ (77%), HER2/neu negative (ratio 1.15), Ki67 20%    01/30/2015 Procedure   Breast High/Moderate Risk panel ([facility]) reveals no clinically significant variant at ATM, BRCA1, BRCA2, CDH1, CHEK2, PALB2, PTEN, STK11, and TP53.   02/08/2015 Clinical Stage   Stage IIA (T2 N0)   03/23/2015 Definitive Surgery   Right mastectomy ([name]): invasive adenocarcinoma, grade 3, 2.9 cm, HER2/neu repeated, negative (ratio 1.23)   03/23/2015 Oncotype testing   Score: 8 (6% ROR). No chemotherapy [name]).   03/23/2015 Pathologic Stage   Stage IIA: pT2 pNx   05/04/2015 -  Anti-estrogen oral therapy   Letrozole 2.52m daily. Planned duration of therapy at least 5 years.    07/25/2015 Survivorship   Survivorship visit completed and copy of care plan provided to patient.   12/15/2015 Mammogram   IMPRESSION: No mammographic evidence of malignancy. A result letter of this screening mammogram will be mailed directly to the patient.   12/23/2016 Mammogram   IMPRESSION: No mammographic evidence of malignancy. A result letter of this screening mammogram will be mailed directly to the patient.    12/30/2017 Mammogram   12/30/2017 Mammogram IMPRESSION: No mammographic evidence of malignancy. A result letter of this screening mammogram will be mailed directly to the patient.   03/27/2020 Mammogram   IMPRESSION: No mammographic evidence of malignancy. A result letter of this screening mammogram will be mailed directly to the patient.   10/26/2020 Imaging   CT chest IMPRESSION: 1. Bulky mediastinal, thoracic inlet, right supraclavicular, and left axillary lymphadenopathy consistent with metastatic disease. Associated numerous bilateral pulmonary nodules and masses, most prominently in the right middle lobe with dominant 4.1 cm peripheral mass lesion. 2. 16 mm rim  enhancing lesion posterior right liver consistent with metastatic involvement. 3. 13 mm hypoenhancing lesion in the tail of pancreas.  Primary neoplasm or metastatic involvement could have this appearance. 4. Multiple nodules in the upper abdomen concerning for peritoneal/mesenteric metastatic disease. 5. Small right and tiny left pleural effusions with bibasilar atelectasis. 6. Aortic Atherosclerosis (ICD10-I70.0).   11/10/2020 PET scan   IMPRESSION: 1. Extensive hypermetabolic multi organ metastatic disease. 2. Bulky intensely hypermetabolic RIGHT supraclavicular, mediastinal and LEFT axillary adenopathy. 3. Hypermetabolic pulmonary mass in the RIGHT upper lobe with additional hypermetabolic nodules. 4. Hypermetabolic solitary hepatic metastasis. 5. Hypermetabolic lesion within the pancreas is also favored metastatic lesion. 6. Multifocal hypermetabolic skeletal metastasis. 7. Bilateral pleural effusions.   11/13/2020 Procedure   Thoracentesis  IMPRESSION: Successful ultrasound guided right thoracentesis yielding 200 mL of pleural fluid.   Small cell lung cancer ([place])  11/13/2020 Pathology Results   Right supraclavicular LN Biopsy  FINAL MICROSCOPIC DIAGNOSIS:   A. LYMPH NODE, RIGHT SUPRACLAVICULAR, NEEDLE CORE BIOPSY:  - Most consistent with small cell carcinoma.   COMMENT:   Tumor is limited in quantity, and partially necrotic.  TTF-1 and  Synaptophysin are positive.  Ki-67 proliferation index reaches 80-90%.  GATA-3, ER and p40 are negative.  The morphologic and immunophenotypic  characteristics are most suggestive of small cell carcinoma.  TTF-1  positive staining is compatible with origin from the lung; however, it  does not exclude origin from other entities.  Please note that distinct  nodal tissue is not identified in the submitted material.  Results  reported to Dr. [name] on 11/16/2020.  Dr. Melina Copa reviewed the case   11/13/2020 Cancer Staging   Staging form: Lung, AJCC 8th Edition - Clinical stage from 11/13/2020: Stage IV (cTX, cN3, cM1) - Signed by [name], MD on 11/16/2020 Stage prefix:  Initial diagnosis   11/13/2020 - 11/28/2020 Radiation Therapy   palliative radiation to [facility] with Dr Lisbeth Renshaw 11/13/20-11/28/20   11/16/2020 Initial Diagnosis   Small cell lung cancer ([place])   11/22/2020 -  Chemotherapy   First line etoposide D1-3, carboplatin and atezolizumab D1 q3weeks beginning 11/22/20.        11/29/2020 Procedure   PAC placed on 11/29/20   01/01/2021 Imaging   IMPRESSION: 1. Significant interval decrease in size of the right middle lobe lung mass and significant interval decrease in size of the mediastinal and right hilar lymph nodes. 2. Resolution of right supraclavicular and left axillary adenopathy. 3. Stable miliary pattern of pulmonary nodules. No new or progressive findings. 4. Stable diffuse sclerotic metastatic bone disease. No findings for progression. 5. Interval decrease in size of the pancreatic mass and adjacent lymph node. 6. Slightly smaller segment 7 liver lesion. No new or progressive findings. 7. Emphysema and aortic atherosclerosis.   Aortic Atherosclerosis (ICD10-I70.0) and Emphysema (ICD10-J43.9).        REVIEW OF SYSTEMS:   Constitutional: Denies fevers, chills Eyes: Denies blurriness of vision Ears, nose, mouth, throat, and face: Denies mucositis, has dysphagia and odynophagia. Respiratory: Denies cough, dyspnea or wheezes Cardiovascular: Denies palpitation, chest discomfort Gastrointestinal: Reports severe dysphagia Skin: Denies abnormal skin rashes Lymphatics: Denies new lymphadenopathy or easy bruising Neurological:Denies numbness, tingling or new weaknesses Behavioral/Psych: Mood is stable, no new changes  Extremities: No lower extremity edema All other systems were reviewed with the patient and are negative.  I have reviewed the past medical history, past surgical history, social history and family history with the patient and they are unchanged from previous note.  PHYSICAL EXAMINATION: ECOG PERFORMANCE STATUS: 3 -  Symptomatic, >50% confined to bed  Vitals:   01/19/21 0334 01/19/21 0545  BP: (!) 149/81 (!) 149/85  Pulse: (!) 101 (!) 104  Resp: 15   Temp: 98.5 F (36.9 C)   SpO2: 100%    Filed Weights   01/16/21 0816  Weight: 51 kg    Intake/Output from previous day: 04/07 0701 - 04/08 0700 In: 1342.5 [P.O.:696; Blood:646.5] Out: 2350 [Urine:2350]  GENERAL: Chronically ill-appearing female, no distress SKIN: skin color, texture, turgor are normal, no rashes or significant lesions, no petechia or ecchymosis EYES: normal, Conjunctiva are pink and non-injected, sclera clear OROPHARYNX: Dry oral mucosa, White coating on tongue LUNGS: clear to auscultation and percussion with normal breathing effort HEART: regular rate & rhythm and no murmurs and no lower extremity edema ABDOMEN:abdomen soft, non-tender and normal bowel sounds NEURO: alert & oriented x 3 with fluent speech, no focal motor/sensory deficits  LABORATORY DATA:  I have reviewed the data as listed CMP Latest Ref Rng & Units 01/19/2021 01/18/2021 01/17/2021  Glucose 70 - 99 mg/dL 94 88 95  BUN 8 - 23 mg/dL 7(L) 8 8  Creatinine 0.44 - 1.00 mg/dL 0.51 0.56 0.67  Sodium 135 - 145 mmol/L 132(L) 128(L) 134(L)  Potassium 3.5 - 5.1 mmol/L 3.1(L) 3.2(L) 4.4  Chloride 98 - 111 mmol/L 98 97(L) 104  CO2 22 - 32 mmol/L 20(L) 18(L) 19(L)  Calcium 8.9 - 10.3 mg/dL 8.1(L) 8.0(L) 8.3(L)  Total Protein 6.5 - 8.1 g/dL - - 5.4(L)  Total Bilirubin 0.3 - 1.2 mg/dL - - 2.3(H)  Alkaline Phos 38 - 126 U/L - - 92  AST 15 - 41 U/L - - 41  ALT 0 - 44 U/L - - 215(H)    Lab Results  Component Value Date   WBC 0.4 (LL) 01/19/2021   HGB 10.4 (L) 01/19/2021   HCT 30.0 (L) 01/19/2021   MCV 80.0 01/19/2021   PLT 23 (LL) 01/19/2021   NEUTROABS 0.1 (LL) 01/19/2021    DG Chest 2 View  Result Date: 01/31/2021 CLINICAL DATA:  Dysphagia and chest pain with nausea and gagging as well as vomiting 3 weeks. History of metastatic small cell lung cancer with last  chemotherapy 2 weeks ago. EXAM: CHEST - 2 VIEW COMPARISON:  11/13/2020 and CT 01/01/2021 FINDINGS: Right IJ Port-A-Cath has tip over the SVC. Lungs are adequately inflated without focal airspace consolidation or effusion. Cardiomediastinal silhouette is normal. Stable moderate sclerotic compression fractures over the thoracic and lumbar spine compared to 01/01/2021, although these are new compared to 10/26/2020. IMPRESSION: 1.  No acute cardiopulmonary disease. 2. Several moderate sclerotic compression fractures of the thoracic and lumbar spine new since January 2022 but unchanged from 01/01/2021 likely metastatic in origin. Electronically Signed   By: Marin Olp M.D.   On: 01/20/2021 14:58   CT CHEST ABDOMEN PELVIS W CONTRAST  Result Date: 01/02/2021 CLINICAL DATA:  Small cell lung cancer. History of chemotherapy and radiation therapy. EXAM: CT CHEST, ABDOMEN, AND PELVIS WITH CONTRAST TECHNIQUE: Multidetector CT imaging of the chest, abdomen and pelvis was performed following the standard protocol during bolus administration of intravenous contrast. CONTRAST:  171m OMNIPAQUE IOHEXOL 300 MG/ML  SOLN COMPARISON:  CT abdomen/pelvis 11/28/2020 and PET-CT 11/10/2020 FINDINGS: CT CHEST FINDINGS Cardiovascular: The heart is normal in size. No pericardial effusion. Stable tortuosity and calcification of the thoracic aorta and stable scattered coronary artery calcifications. The right IJ Port-A-Cath is in good position without complicating features. Mediastinum/Nodes:  Much improved mediastinal and right hilar adenopathy. Anterior mediastinal nodal mass measures 20 x 12 mm on image 23/2. This previously measured 4.1 x 3.5 cm. Subcarinal node measures 9.5 mm on image 27/2. This previously measured 20 mm. On the prior PET-CT there is a large right supraclavicular nodal mass. I do not see any residual measurable lymphadenopathy. The hypermetabolic left axillary adenopathy seen on the prior PET-CT has resolved. The  esophagus is grossly normal. Lungs/Pleura: The peripheral right middle lobe lung mass is much smaller. It measures approximately 4.2 x 2.0 cm on the prior CT scan and now measures approximately 2.3 x 1.0 cm. Persistent miliary pattern of pulmonary nodules throughout both lungs. No new lung lesions. No pleural effusions. Musculoskeletal: Stable appearing diffuse sclerotic T7 and T10 pathologic compression fractures are again noted. Do not see any obvious new or progressive metastatic bone disease. Metastatic disease CT ABDOMEN PELVIS FINDINGS Hepatobiliary: 1.7 x 1.4 cm segment 7 liver lesion previously measured approximately 2.3 x 1.9 cm. No new hepatic metastatic lesions. The gallbladder is unremarkable.  No common bile duct dilatation. Pancreas: The 17 mm pancreatic mass in the body tail junction region now measures approximately 7 mm. There was an adjacent lymph node which measured measured 9 mm and this is no longer identified. Spleen: Normal size.  No focal lesions. Adrenals/Urinary Tract: The adrenal glands and kidneys are unremarkable. No evidence of adrenal gland metastatic disease. The bladder is unremarkable. Stomach/Bowel: The stomach, duodenum, small bowel and colon are unremarkable. Vascular/Lymphatic: Stable atherosclerotic calcifications involving the aorta and branch vessels. No aneurysm or dissection. The major venous structures are patent. No mesenteric or retroperitoneal mass or lymphadenopathy. Reproductive: The uterus and ovaries are unremarkable. Other: No pelvic mass or free pelvic fluid collections. No inguinal mass or adenopathy. Musculoskeletal: Stable severe degenerative changes involving the left hip. Stable scattered sclerotic metastatic bone disease without findings for progression. IMPRESSION: 1. Significant interval decrease in size of the right middle lobe lung mass and significant interval decrease in size of the mediastinal and right hilar lymph nodes. 2. Resolution of right  supraclavicular and left axillary adenopathy. 3. Stable miliary pattern of pulmonary nodules. No new or progressive findings. 4. Stable diffuse sclerotic metastatic bone disease. No findings for progression. 5. Interval decrease in size of the pancreatic mass and adjacent lymph node. 6. Slightly smaller segment 7 liver lesion. No new or progressive findings. 7. Emphysema and aortic atherosclerosis. Aortic Atherosclerosis (ICD10-I70.0) and Emphysema (ICD10-J43.9). Electronically Signed   By: Marijo Sanes M.D.   On: 01/02/2021 12:28   US Abdomen Limited RUQ (LIVER/GB)  Result Date: 01/15/2021 CLINICAL DATA:  Elevated LFTs EXAM: ULTRASOUND ABDOMEN LIMITED RIGHT UPPER QUADRANT COMPARISON:  None. FINDINGS: Gallbladder: No gallstones or wall thickening visualized. No sonographic Murphy sign noted by sonographer. Common bile duct: Diameter: Normal at 1.5 mm Liver: Small subtly hypoechoic solid lesion in the posterosuperior aspect of the right liver measures 1.2 x 1.1 cm. Hepatic parenchymal echogenicity and echotexture within normal limits. Portal vein is patent on color Doppler imaging with normal direction of blood flow towards the liver. Other: None. IMPRESSION: 1. Small nonspecific solid 1.2 cm hypoechoic lesion in the posterosuperior aspect of the right liver. Differential considerations are broad and include both malignant and benign neoplastic processes. Recommend further evaluation with gadolinium-enhanced MRI of the abdomen. 2. Otherwise, unremarkable right upper quadrant ultrasound. Electronically Signed   By: Jacqulynn Cadet M.D.   On: 01/15/2021 06:37    ASSESSMENT AND PLAN: 1.  Extensive stage small  cell lung cancer 2.  Pancytopenia secondary to chemotherapy, worsening thrombocytopenia 3.  Dysphagia, odynophagia 4.  Dehydration, improved 5.  Symptomatic pyuria 6.  Transaminitis and hyperbilirubinemia 7.  Hypothyroidism 8.  History of breast cancer 9. DNR   -Labs reviewed.  She received  Neulasta in our office last week and received Granix x2 doses on 4/6 and 4/7.  Total white blood cell count is up slightly to 0.4 and ANC is 0.1.  Will hold off on additional Granix at this time. -Status post 1 unit of platelets on 4/7 with improvement of platelet count to 23,000.  Continue to monitor and transfuse if platelet count drops below 15,000. -Status post 1 unit PRBCs 4/7 with improvement of hemoglobin up to 10.4.  Recommend PRBC transfusion if hemoglobin drops below 7.5. -Liver enzymes has slightly improved as of 4/6, total bilirubin 2.3, will continue monitoring.  Not checked this morning but will add on to today's labs. -Speech therapy evaluation was ordered 4/7 and it appears the patient will be seen today for possible modified barium swallow. -Continue supportive care  Mikey Bussing  01/19/2021   Addendum  I have seen the patient, examined her. I agree with the assessment and and plan and have edited the notes.   Chanie had a speech evaluation today.  I encouraged her to take pain medication for odynophagia, so she can try more liquid diet, even soft diet.  She is willing to try.  She not feeling better, blood counts are slightly better, continue monitoring closely.  If her neutropenia resolves over the weekend, hopefully she can have EGD early next week. I will continue to f/u.  [name]  01/19/2021

## 2021-01-19 NOTE — Evaluation (Signed)
Clinical/Bedside Swallow Evaluation Patient Details  Name: Tara Rodgers MRN: [id number] Date of Birth: 09/26/1946  Today's Date: 01/19/2021 Time: SLP Start Time (ACUTE ONLY): 1125 SLP Stop Time (ACUTE ONLY): 1215 SLP Time Calculation (min) (ACUTE ONLY): 50 min  Past Medical History:  Past Medical History:  Diagnosis Date  . Anxiety   . Asthma    does not take any medications, hx of inhaler use  . Breast cancer [facility]) 1995/2016   right sided breast cancer in 08/1994  . Bronchitis    hx of  . GERD (gastroesophageal reflux disease)   . History of hiatal hernia   . Hypertension   . Hyperthyroidism   . Lung cancer ([place]) 11/02/2020   Past Surgical History:  Past Surgical History:  Procedure Laterality Date  . BREAST BIOPSY Right 2016   malignant  . BREAST EXCISIONAL BIOPSY Left 1996   benign  . Carpel Tunnel  Bilateral   . INCONTINENCE SURGERY     with mesh insertion about 7 years ago  . IR IMAGING GUIDED PORT INSERTION  11/29/2020  . LATISSIMUS FLAP TO BREAST Right 03/23/2015   Procedure: LATISSIMUS FLAP TO BREAST WITH PLACEMENT OF IMPLANT FOR BREAST RECONSTRUCTION;  Surgeon: [name], MD;  Location: [facility];  Service: Plastics;  Laterality: Right;  . MASTECTOMY Right 03/23/2015  . RECONSTRUCTION BREAST W/ LATISSIMUS DORSI FLAP Right 03/23/2015  . TONSILLECTOMY     as a child  . TOTAL KNEE ARTHROPLASTY Right 03/13/2018   Procedure: RIGHT TOTAL KNEE ARTHROPLASTY;  Surgeon: Sydnee Cabal, MD;  Location: [facility];  Service: Orthopedics;  Laterality: Right;  Adductor Block  . TOTAL MASTECTOMY Right 03/23/2015   Procedure: RIGHT TOTAL MASTECTOMY;  Surgeon: [name], MD;  Location: [facility];  Service: General;  Laterality: Right;   HPI:  Per MD notes"   Tara Rodgers is a 75 y.o. female with PMH significant for metastatic SCLC undergoing chemotherapy s/p XRT, transfusion-dependent anemia, thrombocytopenia, right breast CA s/p mastectomy 2016 on antiestrogen therapy.  Patient  presented to the ED on 01/31/2021 with complaint of 3 weeks of constant, worsening, severe dysphagia, globus sensation, and odynophagia that has limited per oral intake severely to the point that she was not able to tolerate any po and not able to take her medications.   In the ED, she appeared dehydrated, tachycardic to 130s, afebrile, normotensive, benign abdominal exam  Labs with ketones in urine, potassium low at 2.9, metabolic acidosis, LFTs elevated."  She reported odynophagia and dysphagia to her MD - and continues to report  to her oncology NP moderate dysphagia and odynophagia, has variable appetite, only sipping on liquids, and quite fatigued.  No fever or bleeding, no other new complaints.  CXR negative.  Pt was to have endoscopy with GI but due to her labs, she was unable to have this completed.  She is being treated with Fluconazole/Difluan, Carafate, magic mouthrinse, and Rocephin for her dysphagia.   Assessment / Plan / Recommendation Clinical Impression  Pt demonstrates clinical indications of primary esophageal dysphagia.  Her CN exam is negative and she demonstrates strong voice/cough.   She demonstrates frequent throat clearing/cough before po commenced.  Intermittent cough/wet voice noted with initial small boluses of thin that was diminished significantly after a few bolues consumed.  Suspect improved swallowing after few bolues due to pharyngeal/cervical esophageal secretions clearance as a result of swallowing liquids and/or coughing/wretching/gagging and expectorating phlegm.    Pt compensates by taking very small boluses - 1/4 tsp amounts and  coughing to clear PRN.  Observed pt with boluses including pudding, jello, gingerale and water.  Pt reports odynophagia and dysphagia are equivalent in presentation and having been occuring for the last 3-4 weeks.  Pt's primary dysphagia symptoms are noted in esophagus - as she points to mid and distal esophagus toward end of session.  Do not  recommend MBS as do not anticipate this will change pt's outcomes.  Pt is only able to consume very small sips/bites due level of dysphagia/likely esophageal deficits.  Pt reports liquids are easier to clear but denies any improvement with carbonated, cold temperature or warm liquids.  Recommend pt to have diet as she wishes to allow her too choose items she can manage.  Assuring to provide her Magic Mouthwash with lidocaine and Carafate before po advised.    Provided pt with odynophagia and dysphagia compensation strategies due to her esophagitis, mucositis - in writing and using teach back. Advised pt to try to consume po frequently during the day - as this appears to aid with secretion clearance also and maximize her comfort.      Aspiration Risk  Moderate aspiration risk    Diet Recommendation  (diet as tolerated)   Medication Administration: Other (Comment) (suspension) Compensations: Slow rate;Small sips/bites Postural Changes: Seated upright at 90 degrees;Remain upright for at least 30 minutes after po intake    Other  Recommendations Oral Care Recommendations: Oral care QID   Follow up Recommendations None      Frequency and Duration     n/a       Prognosis    n/a    Swallow Study   General HPI: Per MD notes"   Tara Rodgers is a 75 y.o. female with PMH significant for metastatic SCLC undergoing chemotherapy s/p XRT, transfusion-dependent anemia, thrombocytopenia, right breast CA s/p mastectomy 2016 on antiestrogen therapy.  Patient presented to the ED on 01/28/2021 with complaint of 3 weeks of constant, worsening, severe dysphagia, globus sensation, and odynophagia that has limited per oral intake severely to the point that she was not able to tolerate any po and not able to take her medications.   In the ED, she appeared dehydrated, tachycardic to 130s, afebrile, normotensive, benign abdominal exam  Labs with ketones in urine, potassium low at 2.9, metabolic acidosis, LFTs  elevated."  She reported odynophagia and dysphagia to her MD - and continues to report  to her oncology NP moderate dysphagia and odynophagia, has variable appetite, only sipping on liquids, and quite fatigued.  No fever or bleeding, no other new complaints.  CXR negative.  Pt was to have endoscopy with GI but due to her labs, she was unable to have this completed.  She is being treated with Fluconazole/Difluan, Carafate, magic mouthrinse, and Rocephin for her dysphagia. Previous Swallow Assessment: Nutrition follow-up completed with patient during infusion for small cell lung cancer.  Weight decreased and was documented as 119.38 pounds March 2.  Noted labs: Potassium 3.3, albumin 3.1, magnesium 1.6.  Patient reports she just cannot make herself eat.  Reports early satiety.  She is not pushing oral intake.  She denies nausea and vomiting.  Agrees to try Ensure Plus thinned down with water and ice today.  MD ordered Megace today to help with increased appetite.  Concerns for potential esophageal candidiasis and GI was planning for endoscopy but pt unable to have done today.     Per MD notes, suspected Dysphagia, Odynophagia - Pt currently receiving Nystatin, Diflucan IV ordered, Magic mouthwash  with lidocaine, Protonix, and Carafate.  She reports minimal improvement with swallowing. States dysphagia has been present over the last 3-4 weeks - onset after last chemo treatment.  . Diet Prior to this Study: Thin liquids (clears) Temperature Spikes Noted: No Respiratory Status: Room air History of Recent Intubation: No Behavior/Cognition: Alert Oral Care Completed by SLP: No Oral Cavity - Dentition: Adequate natural dentition;Dentures, top Vision: Functional for self-feeding Self-Feeding Abilities: Able to feed self Patient Positioning: Upright in bed Baseline Vocal Quality: Normal Volitional Cough: Strong Volitional Swallow: Able to elicit (odynophagia with secretion swallowing)    Oral/Motor/Sensory  Function Overall Oral Motor/Sensory Function: Within functional limits   Ice Chips Ice chips: Impaired Presentation: Self Fed;Spoon Pharyngeal Phase Impairments: Throat Clearing - Immediate;Throat Clearing - Delayed Other Comments: frequent throat clearing noted   Thin Liquid Thin Liquid: Impaired Presentation: Cup;Self Fed;Spoon;Straw Pharyngeal  Phase Impairments: Throat Clearing - Immediate;Cough - Delayed;Other (comments) Other Comments: With large bolus  pt demonstrates immediate coughing, wretching, belching and expectoration of secretions - which looks very uncomfortable.  Small single amounts via tsp or cup were tolerated much better.    Nectar Thick Nectar Thick Liquid: Not tested   Honey Thick Honey Thick Liquid: Not tested   Puree Puree: Impaired Presentation: Self Fed;Spoon Pharyngeal Phase Impairments: Throat Clearing - Delayed Other Comments: Pt observed consuming jello and pudding. She compensates by taking VERY SMALL boluses - approx 1/4 of tsp only.   Solid     Solid: Not tested Other Comments: pt apprehensive to even take pudding      Macario Golds 01/19/2021,12:51 PM   Kathleen Lime, MS [facility] SLP [place] Office [phone] Pager [phone]

## 2021-01-19 NOTE — Progress Notes (Signed)
Physical Therapy Treatment Patient Details Name: Tara Rodgers MRN: [id number] DOB: Mar 12, 1946 Today's Date: 01/19/2021    History of Present Illness Pt is 75 y.o. female admitted with 3 weeks of constant, worsening, severe dysphagia, globus sensation, and odynophagia that has limited per oral intake severely to the point that she isn't able to tolerate any po and not able to take her medications. Admitted on 01/27/2021 with Admitted with dysphagia, odynophagia, pancytopenia, and hypokalemia. PMH of metastatic SCLC undergoing chemotherapy s/p XRT, transfusion-dependent anemia, thrombocytopenia, right breast CA s/p mastectomy 2016.    PT Comments    Pt making gradual progress but does fatigue very easily.  She had wanted to try to walk again (has not since February) and requested a rollator that is in her room now.  Pt needing min A to control rollator with short distance ambulation (4').  It will provide her with the opportunity for rest breaks but she needs continued gait training to be able to use rollator.  Continue to progress as able.      Follow Up Recommendations  Home health PT;Supervision for mobility/OOB (Pt declined SNF and has family support)     Equipment Recommendations  None recommended by PT    Recommendations for Other Services       Precautions / Restrictions Precautions Precautions: Fall    Mobility  Bed Mobility Overal bed mobility: Needs Assistance Bed Mobility: Supine to Sit     Supine to sit: Supervision;HOB elevated     General bed mobility comments: HOB elevated and use of bed rail, increased time but no physical assist    Transfers Overall transfer level: Needs assistance Equipment used: Rolling walker (2 wheeled) Transfers: Sit to/from [facility] Sit to Stand: Min assist Stand pivot transfers: Min assist       General transfer comment: Performed sit to stand with pivot to recliner with min A and use of arm rails on chair.   Performed a sit to stand with rollator and min A.  Educated on rollator use and use of brakes.  Ambulation/Gait Ambulation/Gait assistance: Min assist Gait Distance (Feet): 4 Feet Assistive device: 4-wheeled walker Gait Pattern/deviations: Step-to pattern;Decreased stride length;Drifts right/left Gait velocity: decreased   General Gait Details: Min A for balance and to control rollator. Fatigued very easily - took a few steps forward and then back to [employer] (Stroke Patients Only)       Balance Overall balance assessment: Needs assistance Sitting-balance support: No upper extremity supported Sitting balance-Leahy Scale: Good     Standing balance support: Bilateral upper extremity supported Standing balance-Leahy Scale: Poor Standing balance comment: required UE support and min A to steady                            Cognition Arousal/Alertness: Awake/alert Behavior During Therapy: Flat affect Overall Cognitive Status: Within Functional Limits for tasks assessed                                 General Comments: States "I'm tired of fighting but I got to keep going."      Exercises      General Comments General comments (skin integrity, edema, etc.): Pt has new rollator in room per her request.  Reports she would like  to start walking some.  She was educated on rollator use (brakes locked for sit to stand or rest breaks, squeeze brakes to stop/slow RW, seat for rest breaks, adjusted handles to correct height)      Pertinent Vitals/Pain Pain Assessment: No/denies pain    Home Living                      Prior Function            PT Goals (current goals can now be found in the care plan section) Acute Rehab PT Goals Patient Stated Goal: be able to walk PT Goal Formulation: With patient Time For Goal Achievement: 01/31/21 Potential to Achieve Goals: Fair Progress  towards PT goals: Progressing toward goals    Frequency    Min 3X/week      PT Plan Discharge plan needs to be updated    Co-evaluation              AM-PAC PT "6 Clicks" Mobility   Outcome Measure  Help needed turning from your back to your side while in a flat bed without using bedrails?: A Little Help needed moving from lying on your back to sitting on the side of a flat bed without using bedrails?: A Little Help needed moving to and from a bed to a chair (including a wheelchair)?: A Little Help needed standing up from a chair using your arms (e.g., wheelchair or bedside chair)?: A Little Help needed to walk in hospital room?: A Lot Help needed climbing 3-5 steps with a railing? : Total 6 Click Score: 15    End of Session Equipment Utilized During Treatment: Gait belt Activity Tolerance: Patient limited by fatigue Patient left: with call bell/phone within reach;with chair alarm set;in chair Nurse Communication: Mobility status PT Visit Diagnosis: Difficulty in walking, not elsewhere classified (R26.2);Muscle weakness (generalized) (M62.81)     Time: 4827-0786 PT Time Calculation (min) (ACUTE ONLY): 20 min  Charges:  $Therapeutic Activity: 8-22 mins                     Abran Richard, PT Acute Rehab Services Pager [phone] [facility] [place] 01/19/2021, 3:59 PM

## 2021-01-19 NOTE — Progress Notes (Signed)
PROGRESS NOTE  Tara Rodgers  DOB: 26-Oct-1945  PCP: Tara Showers, MD ONG:[id number]  DOA: 01/25/2021  LOS: 4 days   Chief Complaint  Patient presents with  . Emesis  . Nausea  . Dysphagia    Brief narrative: Tara Rodgers is a 75 y.o. female with PMH significant for metastatic SCLC undergoing chemotherapy s/p XRT, transfusion-dependent anemia, thrombocytopenia, right breast CA s/p mastectomy 2016 on antiestrogen therapy. Patient presented to the ED on 01/13/2021 with complaint of 3 weeks of constant, worsening, severe dysphagia, globus sensation, and odynophagia that has limited per oral intake severely to the point that she was not able to tolerate any po and not able to take her medications.  In the ED, she appeared dehydrated, tachycardic to 130s, afebrile, normotensive, benign abdominal exam Labs with ketones in urine, potassium low at 2.9, metabolic acidosis, LFTs elevated.   Patient was admitted to hospitalist service GI consultation was obtained. EGD were tentatively planned but could not be done because of subsequent pancytopenia.  Subjective: Patient was seen and examined this morning. Pleasant elderly Caucasian female.  Sitting up in bed.  Continues to have dysphagia.  Does not want advance from clear liquid diet.  Was having mild epistaxis this morning.  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 -Continues to have dysphagia despite nystatin, diflucan IV, Magic mouthwash and Carafate.  Patient does not want to advance beyond clear liquid diet. -GI recommended empiric infectious Tx. IF cytopenias improve and symptoms remain, would reach back out to them for EGD.  Pancytopenia -CBC being monitored.  Labs as below.  Improvement in white count after Granix was given.  Improvement in hemoglobin platelet after transfusion was given. -Continue to monitor.  PRBC transfusion if hemoglobin less than 7.5, platelet  transfusion if less than 15,000 or bleeding. Recent Labs  Lab 01/15/21 1114 01/16/21 0547 01/17/21 0446 01/18/21 0446 01/19/21 0552  WBC 0.6* 0.2* 0.2* 0.1* 0.4*  NEUTROABS 0.4*  --   --   --  0.1*  HGB 8.0* 8.4* 8.2* 7.5* 10.4*  HCT 24.4* 25.2* 24.8* 22.1* 30.0*  MCV 83.8 83.2 82.7 80.4 80.0  PLT 42* 36* 20* 8* 23*   Moderate protein calorie malnutrition -Supplement protein as much as able  Hypokalemia/hypomagnesemia -Potassium level is again low at 3.1 this morning.  IV replacement given. -Magnesium replacement given yesterday.  Repeat tomorrow. Recent Labs  Lab 01/21/2021 1443 01/15/21 0552 01/16/21 0547 01/17/21 0446 01/18/21 0446 01/19/21 0552  K 2.9* 3.1* 3.7 4.4 3.2* 3.1*  MG 1.7  --  1.6*  --  1.3*  --    Stage IV small cell lung cancer -Seen on PET 1/28 started on etoposide, carboplatin and atezolizumab 11/22/2020, s/p palliative mediastinal XRT 1/31 - 2/15.  -Oncology, Dr. [name] following  Solid 1.2 cm hypoechoic lesion in the posterosuperior aspect of the right liver: Nonspecific.  -Defer to oncology   LFT elevation -Multifactorial, improved. Oncology suspects metastatic disease and chemotherapy. RUQ U/S without ductal dilatation -Monitor.  Recent Labs  Lab 01/31/2021 1443 01/15/21 0552 01/16/21 0547 01/17/21 0446 01/19/21 0552  AST 113* 74* 52* 41 20  ALT 479* 338* 276* 215* 109*  ALKPHOS 103 82 89 92 93  BILITOT 2.7* 2.8* 2.5* 2.3* 1.7*  PROT 6.4* 5.4* 5.5* 5.4* 5.7*  ALBUMIN 3.8 3.1* 3.3* 3.2* 3.1*   HTN, tachycardia - Continue IV metoprolol, hopefully could change back to po formulation of medications soon.  Hypothyroidism - Continue synthroid to IV  for now. Has had good response following TSH.  History of breast CA -On antiestrogen which we will hold while unable to take po.  History of covid-19 infection and s/p 2 doses of vaccine -Recommended 3rd dose.  Impaired mobility -PT eval obtained.  SNF recommended.  Mobility: PT eval  obtained Code Status:   Code Status: DNR  Nutritional status: Body mass index is 22.71 kg/m. Nutrition Problem: Moderate Malnutrition Etiology: chronic illness,cancer and cancer related treatments Signs/Symptoms: energy intake < or equal to 75% for > or equal to 1 month,percent weight loss,mild fat depletion,mild muscle depletion Diet Order            Diet clear liquid Room service appropriate? Yes; Fluid consistency: Thin  Diet effective now                 DVT prophylaxis: SCDs Start: 01/16/2021 1939   Antimicrobials:  IV Rocephin, fluconazole Fluid: LR at 100 mill per hour Consultants: GI, oncology Family Communication:  None at bedside  Status is: Inpatient  Remains inpatient appropriate because: Continues to have dysphagia, pancytopenia  Dispo: The patient is from: Home              Anticipated d/c is to: Home with home health PT              Patient currently is not medically stable to d/c.   Difficult to place patient No   Infusions:  . cefTRIAXone (ROCEPHIN)  IV 1 g (01/19/21 1340)  . fluconazole (DIFLUCAN) IV 200 mg (01/19/21 1148)  . lactated ringers with kcl 100 mL/hr at 01/18/21 2354    Scheduled Meds: . Chlorhexidine Gluconate Cloth  6 each Topical Daily  . feeding supplement  1 Container Oral TID BM  . fentaNYL  1 patch Transdermal Q72H  . fentaNYL (SUBLIMAZE) injection  12.5 mcg Intravenous Once  . levothyroxine  50 mcg Intravenous Daily  . mouth rinse  15 mL Mouth Rinse BID  . metoprolol tartrate  5 mg Intravenous Q6H  . nystatin  5 mL Oral QID  . pantoprazole (PROTONIX) IV  40 mg Intravenous Q24H  . sodium chloride flush  10-40 mL Intracatheter Q12H  . sodium chloride flush  3 mL Intravenous Q12H  . sucralfate  1 g Oral TID WC & HS    Antimicrobials: Anti-infectives (From admission, onward)   Start     Dose/Rate Route Frequency Ordered Stop   01/16/21 1000  fluconazole (DIFLUCAN) IVPB 200 mg        200 mg 100 mL/hr over 60 Minutes Intravenous  Daily 01/15/21 1024     01/15/21 1300  cefTRIAXone (ROCEPHIN) 1 g in sodium chloride 0.9 % 100 mL IVPB        1 g 200 mL/hr over 30 Minutes Intravenous Every 24 hours 01/15/21 1210     01/15/21 1100  fluconazole (DIFLUCAN) IVPB 400 mg        400 mg 100 mL/hr over 120 Minutes Intravenous  Once 01/15/21 1024 01/15/21 1326      PRN meds: acetaminophen **OR** [DISCONTINUED] acetaminophen, LORazepam, magic mouthwash w/lidocaine, ondansetron (ZOFRAN) IV, sodium chloride flush   Objective: Vitals:   01/19/21 0545 01/19/21 1353  BP: (!) 149/85 (!) 145/83  Pulse: (!) 104 100  Resp:  18  Temp:  98.1 F (36.7 C)  SpO2:  92%    Intake/Output Summary (Last 24 hours) at 01/19/2021 1425 Last data filed at 01/19/2021 0549 Gross per 24 hour  Intake 646.5 ml  Output  2350 ml  Net -1703.5 ml   Filed Weights   01/16/21 0816  Weight: 51 kg   Weight change:  Body mass index is 22.71 kg/m.   Physical Exam: General exam: Pleasant elderly Caucasian female.  Persistent dysphagia Skin: No rashes, lesions or ulcers. HEENT: Atraumatic, normocephalic, no obvious bleeding.  No oral thrush on exam.  Mild epistaxis this morning Lungs: Clear to auscultation bilaterally CVS: Regular rate and rhythm GI/Abd soft, nontender, nondistended, bowel sound present CNS: Alert, awake, oriented x3 Psychiatry: Depressed look Extremities: No pedal edema, no calf tenderness  Data Review: I have personally reviewed the laboratory data and studies available.  Recent Labs  Lab 01/15/21 1114 01/16/21 0547 01/17/21 0446 01/18/21 0446 01/19/21 0552  WBC 0.6* 0.2* 0.2* 0.1* 0.4*  NEUTROABS 0.4*  --   --   --  0.1*  HGB 8.0* 8.4* 8.2* 7.5* 10.4*  HCT 24.4* 25.2* 24.8* 22.1* 30.0*  MCV 83.8 83.2 82.7 80.4 80.0  PLT 42* 36* 20* 8* 23*   Recent Labs  Lab 02/07/2021 1443 01/15/21 0552 01/16/21 0547 01/17/21 0446 01/18/21 0446 01/19/21 0552  NA 136 133* 134* 134* 128* 132*  K 2.9* 3.1* 3.7 4.4 3.2* 3.1*  CL  106 106 104 104 97* 98  CO2 17* 18* 20* 19* 18* 20*  GLUCOSE 113* 89 95 95 88 94  BUN 17 10 9 8 8  7*  CREATININE 0.84 0.43* 0.47 0.67 0.56 0.51  CALCIUM 8.7* 7.8* 8.4* 8.3* 8.0* 8.1*  MG 1.7  --  1.6*  --  1.3*  --     F/u labs ordered Unresulted Labs (From admission, onward)          Start     Ordered   01/18/21 0500  CBC with Differential/Platelet  Daily,   R     Question:  Specimen collection method  Answer:  Unit=Unit collect   01/17/21 1443   01/18/21 8185  Basic metabolic panel  Daily,   R     Question:  Specimen collection method  Answer:  Unit=Unit collect   01/17/21 1443          Signed, Terrilee Croak, MD [facility] 01/19/2021

## 2021-01-19 NOTE — Care Management Important Message (Signed)
Medicare IM printed for Social Work staff to give to the patient 

## 2021-01-20 DIAGNOSIS — R1310 Dysphagia, unspecified: Secondary | ICD-10-CM | POA: Diagnosis not present

## 2021-01-20 LAB — CBC WITH DIFFERENTIAL/PLATELET
Abs Immature Granulocytes: 0.02 10*3/uL (ref 0.00–0.07)
Basophils Absolute: 0 10*3/uL (ref 0.0–0.1)
Basophils Relative: 4 %
Eosinophils Absolute: 0 10*3/uL (ref 0.0–0.5)
Eosinophils Relative: 0 %
HCT: 29.9 % — ABNORMAL LOW (ref 36.0–46.0)
Hemoglobin: 10.1 g/dL — ABNORMAL LOW (ref 12.0–15.0)
Immature Granulocytes: 2 %
Lymphocytes Relative: 12 %
Lymphs Abs: 0.1 10*3/uL — ABNORMAL LOW (ref 0.7–4.0)
MCH: 27.7 pg (ref 26.0–34.0)
MCHC: 33.8 g/dL (ref 30.0–36.0)
MCV: 82.1 fL (ref 80.0–100.0)
Monocytes Absolute: 0.3 10*3/uL (ref 0.1–1.0)
Monocytes Relative: 34 %
Neutro Abs: 0.4 10*3/uL — CL (ref 1.7–7.7)
Neutrophils Relative %: 48 %
Platelets: 8 10*3/uL — CL (ref 150–400)
RBC: 3.64 MIL/uL — ABNORMAL LOW (ref 3.87–5.11)
RDW: 15 % (ref 11.5–15.5)
WBC: 0.8 10*3/uL — CL (ref 4.0–10.5)
nRBC: 0 % (ref 0.0–0.2)

## 2021-01-20 LAB — BASIC METABOLIC PANEL
Anion gap: 13 (ref 5–15)
BUN: 8 mg/dL (ref 8–23)
CO2: 21 mmol/L — ABNORMAL LOW (ref 22–32)
Calcium: 8.3 mg/dL — ABNORMAL LOW (ref 8.9–10.3)
Chloride: 100 mmol/L (ref 98–111)
Creatinine, Ser: 0.56 mg/dL (ref 0.44–1.00)
GFR, Estimated: >60 mL/min (ref >60)
Glucose, Bld: 95 mg/dL (ref 70–99)
Potassium: 3.3 mmol/L — ABNORMAL LOW (ref 3.5–5.1)
Sodium: 134 mmol/L — ABNORMAL LOW (ref 135–145)

## 2021-01-20 MED ORDER — POTASSIUM CHLORIDE 10 MEQ/100ML IV SOLN
10.0000 meq | INTRAVENOUS | Status: AC
  Administered 2021-01-20 (×2): 10 meq via INTRAVENOUS
  Filled 2021-01-20 (×2): qty 100

## 2021-01-20 MED ORDER — SODIUM CHLORIDE 0.9% IV SOLUTION: Freq: Once | INTRAVENOUS | Status: AC

## 2021-01-20 MED ORDER — POTASSIUM CHLORIDE IN NACL 20-0.9 MEQ/L-% IV SOLN
INTRAVENOUS | Status: DC
  Filled 2021-01-20 (×17): qty 1000

## 2021-01-20 NOTE — Progress Notes (Signed)
PROGRESS NOTE  FRANNY Rodgers  DOB: [birth year]  PCP: Elby Showers, MD TFT:[phone]  DOA: 02/06/2021  LOS: 5 days   Chief Complaint  Patient presents with  . Emesis  . Nausea  . Dysphagia    Brief narrative: Tara Rodgers is a 75 y.o. female with PMH significant for metastatic SCLC undergoing chemotherapy s/p XRT, transfusion-dependent anemia, thrombocytopenia, right breast CA s/p mastectomy 2016 on antiestrogen therapy. Patient presented to the ED on 01/28/2021 with complaint of 3 weeks of constant, worsening, severe dysphagia, globus sensation, and odynophagia that has limited per oral intake severely to the point that she was not able to tolerate any po and not able to take her medications.  In the ED, she appeared dehydrated, tachycardic to 130s, afebrile, normotensive, benign abdominal exam Labs with ketones in urine, potassium low at 2.9, metabolic acidosis, LFTs elevated.   Patient was admitted to hospitalist service GI consultation was obtained. EGD were tentatively planned but could not be done because of subsequent pancytopenia.  Subjective: Patient was seen and examined this morning. Pleasant elderly Caucasian female.  Sitting up in bed.  Continues to have dysphagia despite meds.  Depressed look. 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 -Continues to have dysphagia despite nystatin, diflucan IV, Magic mouthwash and Carafate.  Patient does not want to advance beyond clear liquid diet. -GI recommended empiric infectious Tx. IF cytopenias improve and symptoms remain, would reach back out to them for EGD.  Pancytopenia -CBC being monitored.  Labs as below.  Improvement in white count after Granix was given.  Improvement in hemoglobin platelet after transfusion was given. -Continue to monitor.  PRBC transfusion if hemoglobin less than 7.5, platelet  transfusion if less than 15,000 or bleeding.  Platelet count low at 8 today.  1 unit of platelet transfusion ordered. Recent Labs  Lab 01/15/21 1114 01/16/21 0547 01/17/21 0446 01/18/21 0446 01/19/21 0552 01/20/21 0520  WBC 0.6* 0.2* 0.2* 0.1* 0.4* 0.8*  NEUTROABS 0.4*  --   --   --  0.1* 0.4*  HGB 8.0* 8.4* 8.2* 7.5* 10.4* 10.1*  HCT 24.4* 25.2* 24.8* 22.1* 30.0* 29.9*  MCV 83.8 83.2 82.7 80.4 80.0 82.1  PLT 42* 36* 20* 8* 23* 8*   Moderate protein calorie malnutrition -Supplement protein as much as able  Hypokalemia/hypomagnesemia -Potassium level is again low at 3.3.  Replacement given. -Magnesium replacement given yesterday.  Repeat tomorrow. Recent Labs  Lab 01/31/2021 1443 01/15/21 0552 01/16/21 0547 01/17/21 0446 01/18/21 0446 01/19/21 0552 01/20/21 0520  K 2.9*   < > 3.7 4.4 3.2* 3.1* 3.3*  MG 1.7  --  1.6*  --  1.3*  --   --    < > = values in this interval not displayed.   Stage IV small cell lung cancer -Seen on PET 1/28 started on etoposide, carboplatin and atezolizumab 11/22/2020, s/p palliative mediastinal XRT 1/31 - 2/15.  -Oncology, Dr. [name] following  Solid 1.2 cm hypoechoic lesion in the posterosuperior aspect of the right liver: Nonspecific.  -Defer to oncology   LFT elevation -Multifactorial, improved. Oncology suspects metastatic disease and chemotherapy. RUQ U/S without ductal dilatation -Monitor.  Recent Labs  Lab 01/21/2021 1443 01/15/21 0552 01/16/21 0547 01/17/21 0446 01/19/21 0552  AST 113* 74* 52* 41 20  ALT 479* 338* 276* 215* 109*  ALKPHOS 103 82 89 92 93  BILITOT 2.7* 2.8* 2.5* 2.3* 1.7*  PROT  6.4* 5.4* 5.5* 5.4* 5.7*  ALBUMIN 3.8 3.1* 3.3* 3.2* 3.1*   HTN, tachycardia - Continue IV metoprolol, hopefully could change back to po formulation of medications soon.  Hypothyroidism - Continue synthroid to IV for now. Has had good response following TSH.  History of breast CA -On antiestrogen which we will hold while unable  to take po.  History of covid-19 infection and s/p 2 doses of vaccine -Recommended 3rd dose.  Impaired mobility -PT eval obtained.  SNF recommended.  Mobility: PT eval obtained Code Status:   Code Status: DNR  Nutritional status: Body mass index is 22.71 kg/m. Nutrition Problem: Moderate Malnutrition Etiology: chronic illness,cancer and cancer related treatments Signs/Symptoms: energy intake < or equal to 75% for > or equal to 1 month,percent weight loss,mild fat depletion,mild muscle depletion Diet Order            Diet clear liquid Room service appropriate? Yes; Fluid consistency: Thin  Diet effective now                 DVT prophylaxis: SCDs Start: 01/22/2021 1939   Antimicrobials:  IV Rocephin, fluconazole Fluid: NS with potassium at 100 mill per hour Consultants: GI, oncology Family Communication:  None at bedside  Status is: Inpatient  Remains inpatient appropriate because: Continues to have dysphagia, pancytopenia  Dispo: The patient is from: Home              Anticipated d/c is to: Home with home health PT              Patient currently is not medically stable to d/c.   Difficult to place patient No   Infusions:  . 0.9 % NaCl with KCl 20 mEq / L 100 mL/hr at 01/20/21 0935  . cefTRIAXone (ROCEPHIN)  IV 1 g (01/19/21 1340)  . fluconazole (DIFLUCAN) IV 200 mg (01/20/21 0945)  . potassium chloride      Scheduled Meds: . sodium chloride   Intravenous Once  . Chlorhexidine Gluconate Cloth  6 each Topical Daily  . feeding supplement  1 Container Oral TID BM  . fentaNYL  1 patch Transdermal Q72H  . levothyroxine  50 mcg Intravenous Daily  . mouth rinse  15 mL Mouth Rinse BID  . metoprolol tartrate  5 mg Intravenous Q6H  . nystatin  5 mL Oral QID  . pantoprazole (PROTONIX) IV  40 mg Intravenous Q24H  . sodium chloride flush  10-40 mL Intracatheter Q12H  . sodium chloride flush  3 mL Intravenous Q12H  . sucralfate  1 g Oral TID WC & HS     Antimicrobials: Anti-infectives (From admission, onward)   Start     Dose/Rate Route Frequency Ordered Stop   01/16/21 1000  fluconazole (DIFLUCAN) IVPB 200 mg        200 mg 100 mL/hr over 60 Minutes Intravenous Daily 01/15/21 1024     01/15/21 1300  cefTRIAXone (ROCEPHIN) 1 g in sodium chloride 0.9 % 100 mL IVPB        1 g 200 mL/hr over 30 Minutes Intravenous Every 24 hours 01/15/21 1210     01/15/21 1100  fluconazole (DIFLUCAN) IVPB 400 mg        400 mg 100 mL/hr over 120 Minutes Intravenous  Once 01/15/21 1024 01/15/21 1326      PRN meds: acetaminophen **OR** [DISCONTINUED] acetaminophen, LORazepam, magic mouthwash w/lidocaine, ondansetron (ZOFRAN) IV, sodium chloride flush   Objective: Vitals:   01/20/21 1208 01/20/21 1347  BP: (!) 152/87 Marland Kitchen)  146/75  Pulse: (!) 109 (!) 111  Resp: 18 18  Temp: (!) 97.3 F (36.3 C) 98 F (36.7 C)  SpO2: 100% 100%    Intake/Output Summary (Last 24 hours) at 01/20/2021 1436 Last data filed at 01/20/2021 0631 Gross per 24 hour  Intake 60 ml  Output 450 ml  Net -390 ml   Filed Weights   01/16/21 0816  Weight: 51 kg   Weight change:  Body mass index is 22.71 kg/m.   Physical Exam: General exam: Pleasant elderly Caucasian female.  Persistent dysphagia Skin: No rashes, lesions or ulcers. HEENT: Atraumatic, normocephalic, no obvious bleeding.  No oral thrush on exam.  No epistaxis this morning Lungs: Clear to auscultation bilaterally CVS: Regular rate and rhythm GI/Abd soft, nontender, nondistended, bowel sound present CNS: Alert, awake, oriented x3 Psychiatry: Depressed look Extremities: No pedal edema, no calf tenderness  Data Review: I have personally reviewed the laboratory data and studies available.  Recent Labs  Lab 01/15/21 1114 01/16/21 0547 01/17/21 0446 01/18/21 0446 01/19/21 0552 01/20/21 0520  WBC 0.6* 0.2* 0.2* 0.1* 0.4* 0.8*  NEUTROABS 0.4*  --   --   --  0.1* 0.4*  HGB 8.0* 8.4* 8.2* 7.5* 10.4* 10.1*   HCT 24.4* 25.2* 24.8* 22.1* 30.0* 29.9*  MCV 83.8 83.2 82.7 80.4 80.0 82.1  PLT 42* 36* 20* 8* 23* 8*   Recent Labs  Lab 01/12/2021 1443 01/15/21 0552 01/16/21 0547 01/17/21 0446 01/18/21 0446 01/19/21 0552 01/20/21 0520  NA 136   < > 134* 134* 128* 132* 134*  K 2.9*   < > 3.7 4.4 3.2* 3.1* 3.3*  CL 106   < > 104 104 97* 98 100  CO2 17*   < > 20* 19* 18* 20* 21*  GLUCOSE 113*   < > 95 95 88 94 95  BUN 17   < > 9 8 8  7* 8  CREATININE 0.84   < > 0.47 0.67 0.56 0.51 0.56  CALCIUM 8.7*   < > 8.4* 8.3* 8.0* 8.1* 8.3*  MG 1.7  --  1.6*  --  1.3*  --   --    < > = values in this interval not displayed.    F/u labs ordered Unresulted Labs (From admission, onward)          Start     Ordered   01/21/21 8250  Basic metabolic panel  Daily,   R     Question:  Specimen collection method  Answer:  Unit=Unit collect   01/20/21 1436   01/21/21 0500  CBC with Differential/Platelet  Daily,   R     Question:  Specimen collection method  Answer:  Unit=Unit collect   01/20/21 1436   01/21/21 0500  Magnesium  Tomorrow morning,   STAT       Question:  Specimen collection method  Answer:  Unit=Unit collect   01/20/21 1436   01/21/21 0500  Phosphorus  Tomorrow morning,   R       Question:  Specimen collection method  Answer:  Unit=Unit collect   01/20/21 1436          Signed, Terrilee Croak, MD [facility] 01/20/2021

## 2021-01-21 ENCOUNTER — Encounter: Payer: Self-pay | Admitting: Hematology

## 2021-01-21 DIAGNOSIS — T451X5A Adverse effect of antineoplastic and immunosuppressive drugs, initial encounter: Secondary | ICD-10-CM | POA: Diagnosis not present

## 2021-01-21 DIAGNOSIS — D701 Agranulocytosis secondary to cancer chemotherapy: Secondary | ICD-10-CM | POA: Diagnosis not present

## 2021-01-21 DIAGNOSIS — R1310 Dysphagia, unspecified: Secondary | ICD-10-CM | POA: Diagnosis not present

## 2021-01-21 LAB — BASIC METABOLIC PANEL
Anion gap: 12 (ref 5–15)
BUN: 8 mg/dL (ref 8–23)
CO2: 19 mmol/L — ABNORMAL LOW (ref 22–32)
Calcium: 7.9 mg/dL — ABNORMAL LOW (ref 8.9–10.3)
Chloride: 103 mmol/L (ref 98–111)
Creatinine, Ser: 0.47 mg/dL (ref 0.44–1.00)
GFR, Estimated: >60 mL/min (ref >60)
Glucose, Bld: 96 mg/dL (ref 70–99)
Potassium: 3.3 mmol/L — ABNORMAL LOW (ref 3.5–5.1)
Sodium: 134 mmol/L — ABNORMAL LOW (ref 135–145)

## 2021-01-21 LAB — PHOSPHORUS: Phosphorus: 3.3 mg/dL (ref 2.5–4.6)

## 2021-01-21 LAB — CBC WITH DIFFERENTIAL/PLATELET
Basophils Absolute: 0 10*3/uL (ref 0.0–0.1)
Basophils Relative: 0 %
Eosinophils Absolute: 0 10*3/uL (ref 0.0–0.5)
Eosinophils Relative: 0 %
HCT: 27.3 % — ABNORMAL LOW (ref 36.0–46.0)
Hemoglobin: 9.1 g/dL — ABNORMAL LOW (ref 12.0–15.0)
Immature Granulocytes: 8 %
Lymphocytes Relative: 10 %
Lymphs Abs: 0.2 10*3/uL — ABNORMAL LOW (ref 0.7–4.0)
MCH: 27.3 pg (ref 26.0–34.0)
MCHC: 33.3 g/dL (ref 30.0–36.0)
MCV: 82 fL (ref 80.0–100.0)
Monocytes Absolute: 0.1 10*3/uL (ref 0.1–1.0)
Monocytes Relative: 8 %
Neutro Abs: 1.2 10*3/uL — ABNORMAL LOW (ref 1.7–7.7)
Neutrophils Relative %: 82 %
Platelets: 22 10*3/uL — CL (ref 150–400)
RBC: 3.33 MIL/uL — ABNORMAL LOW (ref 3.87–5.11)
RDW: 15.5 % (ref 11.5–15.5)
WBC: 1.5 10*3/uL — ABNORMAL LOW (ref 4.0–10.5)
nRBC: 0 % (ref 0.0–0.2)

## 2021-01-21 LAB — PREPARE PLATELET PHERESIS: Unit division: 0

## 2021-01-21 LAB — BPAM PLATELET PHERESIS
Blood Product Expiration Date: 202204112359
ISSUE DATE / TIME: 202204091135
Unit Type and Rh: 5100

## 2021-01-21 LAB — MAGNESIUM: Magnesium: 1.3 mg/dL — ABNORMAL LOW (ref 1.7–2.4)

## 2021-01-21 MED ORDER — MAGNESIUM SULFATE 4 GM/100ML IV SOLN
4.0000 g | Freq: Once | INTRAVENOUS | Status: AC
  Administered 2021-01-21: 4 g via INTRAVENOUS
  Filled 2021-01-21: qty 100

## 2021-01-21 MED ORDER — POTASSIUM CHLORIDE 10 MEQ/100ML IV SOLN
10.0000 meq | INTRAVENOUS | Status: AC
  Administered 2021-01-21 (×2): 10 meq via INTRAVENOUS
  Filled 2021-01-21 (×2): qty 100

## 2021-01-21 NOTE — Progress Notes (Signed)
Date and time results received: 01/21/21 N (use smartphrase ".now" to insert current time)  Test: plt Critical Value: 22  Name of Provider Notified: Binaya  Orders Received? Or Actions Taken?: Orders Received - See Orders for details   Waiting on response from MD.

## 2021-01-21 NOTE — Progress Notes (Signed)
HEMATOLOGY-ONCOLOGY PROGRESS NOTE  SUBJECTIVE: No new event, pt required plt transfusion yesterday, no clinical signs of bleeding.  She still complains of dysphagia and odynophagia, and is afraid of eating.  She is currently on liquid diet.  Had a bowel movement this week.  No abdominal pain or nausea.   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 [facility]) Staging form: Breast, AJCC 7th Edition - Clinical: Stage IIA (T2, N0, M0) - Signed by [name], MD on 02/08/2015 Laterality: Right Estrogen receptor status: Positive Progesterone receptor status: Positive HER2 status: Negative - Pathologic stage from 03/23/2015: Stage Unknown (T2, NX, cM0) - Unsigned  Small cell lung cancer ([place]) Staging form: Lung, AJCC 8th Edition - Clinical stage from 11/13/2020: Stage IV (cTX, cN3, cM1) - Signed by [name], MD on 11/16/2020 Stage prefix: Initial diagnosis     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) and adjuvant radiation ([name]).   12/13/2014 Mammogram   Right breast: possible asymmetry warranting further evaluation with spot compression views and possibly ultrasound   12/16/2014 Breast US   Right breast: no definitive abnormality within the superior aspect of the right breast to correspond with the mammographic finding.   01/10/2015 Breast MRI   Right breast: irregular rim enhancing mass within the superior right breast at 11:30 measuring 2.5 x 2.4 x 2.4 cm. No abnormal enhancement is identified within the skin of the superior right breast in the area of concern on mammogram.   01/12/2015 Breast US   Second look diagnostic mammogram and ultrasound showed a 2.3 cm mass in the upper midline right  breast. No axillary adenopathy.   01/12/2015 Initial Biopsy   Right breast needle core bx (upper midline): Invasive ductal carcinoma, grade 2, ER+ (100%), PR+ (77%), HER2/neu negative (ratio 1.15), Ki67 20%    01/30/2015 Procedure   Breast High/Moderate Risk panel ([facility]) reveals no clinically significant variant at ATM, BRCA1, BRCA2, CDH1, CHEK2, PALB2, PTEN, STK11, and TP53.   02/08/2015 Clinical Stage   Stage IIA (T2 N0)   03/23/2015 Definitive Surgery   Right mastectomy ([name]): invasive adenocarcinoma, grade 3, 2.9 cm, HER2/neu repeated, negative (ratio 1.23)   03/23/2015 Oncotype testing   Score: 8 (6% ROR). No chemotherapy [name]).   03/23/2015 Pathologic Stage   Stage IIA: pT2 pNx   05/04/2015 -  Anti-estrogen oral therapy   Letrozole 2.22m daily. Planned duration of therapy at least 5 years.    07/25/2015 Survivorship   Survivorship visit completed and copy of care plan provided to patient.   12/15/2015 Mammogram   IMPRESSION: No mammographic evidence of malignancy. A result letter of this screening mammogram will be mailed directly to the patient.   12/23/2016 Mammogram   IMPRESSION: No mammographic evidence of malignancy. A result letter of this screening mammogram will be mailed directly to the patient.    12/30/2017 Mammogram   12/30/2017 Mammogram IMPRESSION: No mammographic evidence of malignancy. A result letter of this screening mammogram will be mailed directly to the patient.   03/27/2020 Mammogram   IMPRESSION: No mammographic evidence of malignancy. A result letter of this screening mammogram will be mailed directly to the patient.   10/26/2020 Imaging   CT chest IMPRESSION: 1. Bulky mediastinal, thoracic inlet, right supraclavicular, and left axillary  lymphadenopathy consistent with metastatic disease. Associated numerous bilateral pulmonary nodules and masses, most prominently in the right middle lobe with dominant 4.1 cm peripheral mass lesion. 2. 16  mm rim enhancing lesion posterior right liver consistent with metastatic involvement. 3. 13 mm hypoenhancing lesion in the tail of pancreas. Primary neoplasm or metastatic involvement could have this appearance. 4. Multiple nodules in the upper abdomen concerning for peritoneal/mesenteric metastatic disease. 5. Small right and tiny left pleural effusions with bibasilar atelectasis. 6. Aortic Atherosclerosis (ICD10-I70.0).   11/10/2020 PET scan   IMPRESSION: 1. Extensive hypermetabolic multi organ metastatic disease. 2. Bulky intensely hypermetabolic RIGHT supraclavicular, mediastinal and LEFT axillary adenopathy. 3. Hypermetabolic pulmonary mass in the RIGHT upper lobe with additional hypermetabolic nodules. 4. Hypermetabolic solitary hepatic metastasis. 5. Hypermetabolic lesion within the pancreas is also favored metastatic lesion. 6. Multifocal hypermetabolic skeletal metastasis. 7. Bilateral pleural effusions.   11/13/2020 Procedure   Thoracentesis  IMPRESSION: Successful ultrasound guided right thoracentesis yielding 200 mL of pleural fluid.   Small cell lung cancer ([place])  11/13/2020 Pathology Results   Right supraclavicular LN Biopsy  FINAL MICROSCOPIC DIAGNOSIS:   A. LYMPH NODE, RIGHT SUPRACLAVICULAR, NEEDLE CORE BIOPSY:  - Most consistent with small cell carcinoma.   COMMENT:   Tumor is limited in quantity, and partially necrotic.  TTF-1 and  Synaptophysin are positive.  Ki-67 proliferation index reaches 80-90%.  GATA-3, ER and p40 are negative.  The morphologic and immunophenotypic  characteristics are most suggestive of small cell carcinoma.  TTF-1  positive staining is compatible with origin from the lung; however, it  does not exclude origin from other entities.  Please note that distinct  nodal tissue is not identified in the submitted material.  Results  reported to Dr. [name] on 11/16/2020.  Dr. Melina Copa reviewed the case   11/13/2020 Cancer Staging    Staging form: Lung, AJCC 8th Edition - Clinical stage from 11/13/2020: Stage IV (cTX, cN3, cM1) - Signed by [name], MD on 11/16/2020 Stage prefix: Initial diagnosis   11/13/2020 - 11/28/2020 Radiation Therapy   palliative radiation to [facility] with Dr Lisbeth Renshaw 11/13/20-11/28/20   11/16/2020 Initial Diagnosis   Small cell lung cancer ([place])   11/22/2020 -  Chemotherapy   First line etoposide D1-3, carboplatin and atezolizumab D1 q3weeks beginning 11/22/20.        11/29/2020 Procedure   PAC placed on 11/29/20   01/01/2021 Imaging   IMPRESSION: 1. Significant interval decrease in size of the right middle lobe lung mass and significant interval decrease in size of the mediastinal and right hilar lymph nodes. 2. Resolution of right supraclavicular and left axillary adenopathy. 3. Stable miliary pattern of pulmonary nodules. No new or progressive findings. 4. Stable diffuse sclerotic metastatic bone disease. No findings for progression. 5. Interval decrease in size of the pancreatic mass and adjacent lymph node. 6. Slightly smaller segment 7 liver lesion. No new or progressive findings. 7. Emphysema and aortic atherosclerosis.   Aortic Atherosclerosis (ICD10-I70.0) and Emphysema (ICD10-J43.9).        REVIEW OF SYSTEMS:   Constitutional: Denies fevers, chills Eyes: Denies blurriness of vision Ears, nose, mouth, throat, and face: Denies mucositis, has dysphagia and odynophagia. Respiratory: Denies cough, dyspnea or wheezes Cardiovascular: Denies palpitation, chest discomfort Gastrointestinal: Reports severe dysphagia Skin: Denies abnormal skin rashes Lymphatics: Denies new lymphadenopathy or easy bruising Neurological:Denies numbness, tingling or new weaknesses Behavioral/Psych: Mood is stable, no new changes  Extremities: No lower extremity edema All other systems were reviewed with the patient and  are negative.  I have reviewed the past medical history, past surgical history, social  history and family history with the patient and they are unchanged from previous note.   PHYSICAL EXAMINATION: ECOG PERFORMANCE STATUS: 3 - Symptomatic, >50% confined to bed  Vitals:   01/21/21 0455 01/21/21 1318  BP: (!) 167/115 (!) 153/83  Pulse: (!) 108 98  Resp: 18 18  Temp: (!) 97.3 F (36.3 C) 97.6 F (36.4 C)  SpO2: 99% 99%   Filed Weights   01/16/21 0816  Weight: 112 lb 7 oz (51 kg)    Intake/Output from previous day: 04/09 0701 - 04/10 0700 In: 386 [P.O.:120; Blood:266] Out: 1950 [Urine:1950]  GENERAL: Chronically ill-appearing female, no distress SKIN: skin color, texture, turgor are normal, no rashes or significant lesions, no petechia or ecchymosis EYES: normal, Conjunctiva are pink and non-injected, sclera clear ABDOMEN:abdomen soft, non-tender and normal bowel sounds NEURO: alert & oriented x 3 with fluent speech, no focal motor/sensory deficits  LABORATORY DATA:  I have reviewed the data as listed CMP Latest Ref Rng & Units 01/21/2021 01/20/2021 01/19/2021  Glucose 70 - 99 mg/dL 96 95 94  BUN 8 - 23 mg/dL 8 8 7(L)  Creatinine 0.44 - 1.00 mg/dL 0.47 0.56 0.51  Sodium 135 - 145 mmol/L 134(L) 134(L) 132(L)  Potassium 3.5 - 5.1 mmol/L 3.3(L) 3.3(L) 3.1(L)  Chloride 98 - 111 mmol/L 103 100 98  CO2 22 - 32 mmol/L 19(L) 21(L) 20(L)  Calcium 8.9 - 10.3 mg/dL 7.9(L) 8.3(L) 8.1(L)  Total Protein 6.5 - 8.1 g/dL - - 5.7(L)  Total Bilirubin 0.3 - 1.2 mg/dL - - 1.7(H)  Alkaline Phos 38 - 126 U/L - - 93  AST 15 - 41 U/L - - 20  ALT 0 - 44 U/L - - 109(H)    Lab Results  Component Value Date   WBC 1.5 (L) 01/21/2021   HGB 9.1 (L) 01/21/2021   HCT 27.3 (L) 01/21/2021   MCV 82.0 01/21/2021   PLT 22 (LL) 01/21/2021   NEUTROABS 1.2 (L) 01/21/2021    DG Chest 2 View  Result Date: 01/31/2021 CLINICAL DATA:  Dysphagia and chest pain with nausea and gagging as well as vomiting 3 weeks. History of metastatic small cell lung cancer with last chemotherapy 2 weeks ago. EXAM:  CHEST - 2 VIEW COMPARISON:  11/13/2020 and CT 01/01/2021 FINDINGS: Right IJ Port-A-Cath has tip over the SVC. Lungs are adequately inflated without focal airspace consolidation or effusion. Cardiomediastinal silhouette is normal. Stable moderate sclerotic compression fractures over the thoracic and lumbar spine compared to 01/01/2021, although these are new compared to 10/26/2020. IMPRESSION: 1.  No acute cardiopulmonary disease. 2. Several moderate sclerotic compression fractures of the thoracic and lumbar spine new since January 2022 but unchanged from 01/01/2021 likely metastatic in origin. Electronically Signed   By: Marin Olp M.D.   On: 01/20/2021 14:58   CT CHEST ABDOMEN PELVIS W CONTRAST  Result Date: 01/02/2021 CLINICAL DATA:  Small cell lung cancer. History of chemotherapy and radiation therapy. EXAM: CT CHEST, ABDOMEN, AND PELVIS WITH CONTRAST TECHNIQUE: Multidetector CT imaging of the chest, abdomen and pelvis was performed following the standard protocol during bolus administration of intravenous contrast. CONTRAST:  148m OMNIPAQUE IOHEXOL 300 MG/ML  SOLN COMPARISON:  CT abdomen/pelvis 11/28/2020 and PET-CT 11/10/2020 FINDINGS: CT CHEST FINDINGS Cardiovascular: The heart is normal in size. No pericardial effusion. Stable tortuosity and calcification of the thoracic aorta and stable scattered coronary artery calcifications. The right IJ Port-A-Cath is in  good position without complicating features. Mediastinum/Nodes: Much improved mediastinal and right hilar adenopathy. Anterior mediastinal nodal mass measures 20 x 12 mm on image 23/2. This previously measured 4.1 x 3.5 cm. Subcarinal node measures 9.5 mm on image 27/2. This previously measured 20 mm. On the prior PET-CT there is a large right supraclavicular nodal mass. I do not see any residual measurable lymphadenopathy. The hypermetabolic left axillary adenopathy seen on the prior PET-CT has resolved. The esophagus is grossly normal.  Lungs/Pleura: The peripheral right middle lobe lung mass is much smaller. It measures approximately 4.2 x 2.0 cm on the prior CT scan and now measures approximately 2.3 x 1.0 cm. Persistent miliary pattern of pulmonary nodules throughout both lungs. No new lung lesions. No pleural effusions. Musculoskeletal: Stable appearing diffuse sclerotic T7 and T10 pathologic compression fractures are again noted. Do not see any obvious new or progressive metastatic bone disease. Metastatic disease CT ABDOMEN PELVIS FINDINGS Hepatobiliary: 1.7 x 1.4 cm segment 7 liver lesion previously measured approximately 2.3 x 1.9 cm. No new hepatic metastatic lesions. The gallbladder is unremarkable.  No common bile duct dilatation. Pancreas: The 17 mm pancreatic mass in the body tail junction region now measures approximately 7 mm. There was an adjacent lymph node which measured measured 9 mm and this is no longer identified. Spleen: Normal size.  No focal lesions. Adrenals/Urinary Tract: The adrenal glands and kidneys are unremarkable. No evidence of adrenal gland metastatic disease. The bladder is unremarkable. Stomach/Bowel: The stomach, duodenum, small bowel and colon are unremarkable. Vascular/Lymphatic: Stable atherosclerotic calcifications involving the aorta and branch vessels. No aneurysm or dissection. The major venous structures are patent. No mesenteric or retroperitoneal mass or lymphadenopathy. Reproductive: The uterus and ovaries are unremarkable. Other: No pelvic mass or free pelvic fluid collections. No inguinal mass or adenopathy. Musculoskeletal: Stable severe degenerative changes involving the left hip. Stable scattered sclerotic metastatic bone disease without findings for progression. IMPRESSION: 1. Significant interval decrease in size of the right middle lobe lung mass and significant interval decrease in size of the mediastinal and right hilar lymph nodes. 2. Resolution of right supraclavicular and left axillary  adenopathy. 3. Stable miliary pattern of pulmonary nodules. No new or progressive findings. 4. Stable diffuse sclerotic metastatic bone disease. No findings for progression. 5. Interval decrease in size of the pancreatic mass and adjacent lymph node. 6. Slightly smaller segment 7 liver lesion. No new or progressive findings. 7. Emphysema and aortic atherosclerosis. Aortic Atherosclerosis (ICD10-I70.0) and Emphysema (ICD10-J43.9). Electronically Signed   By: Marijo Sanes M.D.   On: 01/02/2021 12:28   US Abdomen Limited RUQ (LIVER/GB)  Result Date: 01/15/2021 CLINICAL DATA:  Elevated LFTs EXAM: ULTRASOUND ABDOMEN LIMITED RIGHT UPPER QUADRANT COMPARISON:  None. FINDINGS: Gallbladder: No gallstones or wall thickening visualized. No sonographic Murphy sign noted by sonographer. Common bile duct: Diameter: Normal at 1.5 mm Liver: Small subtly hypoechoic solid lesion in the posterosuperior aspect of the right liver measures 1.2 x 1.1 cm. Hepatic parenchymal echogenicity and echotexture within normal limits. Portal vein is patent on color Doppler imaging with normal direction of blood flow towards the liver. Other: None. IMPRESSION: 1. Small nonspecific solid 1.2 cm hypoechoic lesion in the posterosuperior aspect of the right liver. Differential considerations are broad and include both malignant and benign neoplastic processes. Recommend further evaluation with gadolinium-enhanced MRI of the abdomen. 2. Otherwise, unremarkable right upper quadrant ultrasound. Electronically Signed   By: Jacqulynn Cadet M.D.   On: 01/15/2021 06:37    ASSESSMENT AND  PLAN: 1.  Extensive stage small cell lung cancer 2.  Pancytopenia secondary to chemotherapy, worsening thrombocytopenia 3.  Dysphagia, odynophagia, likely secondary to chemo, radiation and possible Candida esophagitis 4.  Transaminitis and hyperbilirubinemia, improved  5.  Failure of thrive 6.  Hypothyroidism 7.  History of breast cancer 8. DNR   -Neutropenia  is resolving, ANC 1.2 today.  Should be adequate for EGD. Hope GI will see her tomorrow  -I again encouraged her to try soft diet, such as applesauce and Jell-O, she is willing to try.  I told her nurse to order for her today -Continue supportive care, platelet transfusion if platelet less than 10, blood transfusion if hemoglobin less than 7.5 -I will continue to follow   [name]  01/21/2021

## 2021-01-21 NOTE — Progress Notes (Signed)
   01/21/21 2157  Assess: MEWS Score  Temp 98.4 F (36.9 C)  BP (!) 149/84  Pulse Rate (!) 118  Resp 20  SpO2 100 %  O2 Device Room Air  Assess: MEWS Score  MEWS Temp 0  MEWS Systolic 0  MEWS Pulse 2  MEWS RR 0  MEWS LOC 0  MEWS Score 2  MEWS Score Color Yellow  Assess: if the MEWS score is Yellow or Red  Were vital signs taken at a resting state? Yes  Focused Assessment No change from prior assessment  Early Detection of Sepsis Score *See Row Information* Low  MEWS guidelines implemented *See Row Information* Yes  Treat  MEWS Interventions Escalated (See documentation below) Cabin crew notified)  Pain Scale 0-10  Pain Score 0  Take Vital Signs  Increase Vital Sign Frequency  Yellow: Q 2hr X 2 then Q 4hr X 2, if remains yellow, continue Q 4hrs  Escalate  MEWS: Escalate Yellow: discuss with charge nurse/RN and consider discussing with provider and RRT  Notify: Charge Nurse/RN  Name of Charge Nurse/RN Notified Meredith, RN  Date Charge Nurse/RN Notified 01/21/21  Time Charge Nurse/RN Notified 2222  Document  Patient Outcome Other (Comment) (Patient stable on unit)  Progress note created (see row info) Yes

## 2021-01-21 NOTE — Progress Notes (Signed)
Occupational Therapy Treatment Patient Details Name: Tara Rodgers MRN: [id number] DOB: 1946/05/30 Today's Date: 01/21/2021    History of present illness Pt is 75 y.o. female admitted with 3 weeks of constant, worsening, severe dysphagia, globus sensation, and odynophagia that has limited per oral intake severely to the point that she isn't able to tolerate any po and not able to take her medications. Admitted on 01/26/2021 with Admitted with dysphagia, odynophagia, pancytopenia, and hypokalemia. PMH of metastatic SCLC undergoing chemotherapy s/p XRT, transfusion-dependent anemia, thrombocytopenia, right breast CA s/p mastectomy 2016.   OT comments  Patient agreeable to bed exercises today. Reports continued minimal nutrition limiting strength and endurance. Patient tolerated exercises with rest break after each set. Therapist encouraged patient to perform exercises on her own as well to maintain strength and ROM. LEs weaker than UE.   Follow Up Recommendations  Home health OT    Equipment Recommendations  None recommended by OT    Recommendations for Other Services      Precautions / Restrictions Precautions Precautions: Fall Restrictions Weight Bearing Restrictions: No       Mobility Bed Mobility                    Transfers                      Balance                                           ADL either performed or assessed with clinical judgement   ADL                                               Vision Patient Visual Report: No change from baseline     Perception     Praxis      Cognition Arousal/Alertness: Awake/alert Behavior During Therapy: Flat affect Overall Cognitive Status: Within Functional Limits for tasks assessed                                          Exercises Other Exercises Other Exercises: 5 straight leg raises each leg, 5 heel slides each leg, 10 shoulder press  reaches bilateral arms, 5 pseudo sit up/reaching reps.   Shoulder Instructions       General Comments      Pertinent Vitals/ Pain       Pain Assessment: No/denies pain  Home Living                                          Prior Functioning/Environment              Frequency  Min 2X/week        Progress Toward Goals  OT Goals(current goals can now be found in the care plan section)  Progress towards OT goals: Progressing toward goals  Acute Rehab OT Goals Patient Stated Goal: be able to walk OT Goal Formulation: With patient Time For Goal Achievement: 01/31/21 Potential to Achieve Goals: [place] Discharge plan remains appropriate  Co-evaluation                 AM-PAC OT "6 Clicks" Daily Activity     Outcome Measure   Help from another person eating meals?: None Help from another person taking care of personal grooming?: A Little Help from another person toileting, which includes using toliet, bedpan, or urinal?: A Little Help from another person bathing (including washing, rinsing, drying)?: A Little Help from another person to put on and taking off regular upper body clothing?: A Little Help from another person to put on and taking off regular lower body clothing?: A Little 6 Click Score: 19    End of Session    OT Visit Diagnosis: Muscle weakness (generalized) (M62.81)   Activity Tolerance Patient limited by fatigue   Patient Left in bed;with family/visitor present   Nurse Communication Mobility status        Time: 6803-2122 OT Time Calculation (min): 9 min  Charges: OT General Charges $OT Visit: 1 Visit OT Treatments $Therapeutic Exercise: 8-22 mins  Derl Barrow, OTR/L [place]  Office [phone] Pager: [place] 01/21/2021, 4:04 PM

## 2021-01-21 NOTE — Progress Notes (Signed)
Pharmacy Antibiotic Note  Tara Rodgers is a 75 y.o. female admitted on 01/29/2021 with possible esophageal candidiasis.  Pharmacy has been consulted for fluconazole dosing. Pt is  neutropenic patient with possible  Candidal esophagitis. Gi unable to currently do perform EGD due to low blood counts .  01/21/2021 Day #7 fluconazole & ceftriaxone S/p Granix 4/6 & 4/7, Neutropenia has resolved WBC 1.5, ANC 0.9 Still w/ dysphagia per chart notes   Plan: Continue Fluconazole  200 mg IV daily   DC Rocephin (d/w TRH) Plan for EGD this week   Height: 4\' 11"  (149.9 cm) Weight: 51 kg (112 lb 7 oz) IBW/kg (Calculated) : 43.2  Temp (24hrs), Avg:97.7 F (36.5 C), Min:97.3 F (36.3 C), Max:98.2 F (36.8 C)  Recent Labs  Lab 01/17/21 0446 01/18/21 0446 01/19/21 0552 01/20/21 0520 01/21/21 0623  WBC 0.2* 0.1* 0.4* 0.8* 1.5*  CREATININE 0.67 0.56 0.51 0.56 0.47    Estimated Creatinine Clearance: 42.7 mL/min (by C-G formula based on SCr of 0.47 mg/dL).    Allergies  Allergen Reactions  . Fosamax [Alendronate Sodium] Other (See Comments)    Aching all over     Thank you for allowing pharmacy to be a part of this patient's care.  Eudelia Bunch, Pharm.D 01/21/2021 12:13 PM

## 2021-01-21 NOTE — Progress Notes (Signed)
PROGRESS NOTE  Tara Rodgers  DOB: [birth year]  PCP: Elby Showers, MD BMW:[id number]  DOA: 01/19/2021  LOS: 6 days   Chief Complaint  Patient presents with  . Emesis  . Nausea  . Dysphagia    Brief narrative: Tara Rodgers is a 75 y.o. female with PMH significant for metastatic SCLC undergoing chemotherapy s/p XRT, transfusion-dependent anemia, thrombocytopenia, right breast CA s/p mastectomy 2016 on antiestrogen therapy. Patient presented to the ED on 01/13/2021 with complaint of 3 weeks of constant, worsening, severe dysphagia, globus sensation, and odynophagia that has limited per oral intake severely to the point that she was not able to tolerate any po and not able to take her medications.  In the ED, she appeared dehydrated, tachycardic to 130s, afebrile, normotensive, benign abdominal exam Labs with ketones in urine, potassium low at 2.9, metabolic acidosis, LFTs elevated.   Patient was admitted to hospitalist service GI consultation was obtained. EGD were tentatively planned but could not be done because of subsequent pancytopenia.  Subjective: Patient was seen and examined this morning. Sitting up in bed.  Seems very depressed.  Continues to have dysphagia.  She is afraid to try any food.  I encouraged her to comply with Carafate, Magic mouthwash and try soft food.  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 -Continues to have dysphagia despite nystatin, diflucan IV, Magic mouthwash and Carafate.  Patient does not want to advance beyond clear liquid diet. -GI recommended empiric infectious Tx. IF cytopenias improve and symptoms remain, would reach back out to them for EGD.  Pancytopenia -CBC being monitored.  Labs as below.   -WBC count better today at 1.5, platelet count better at 22.  Hemoglobin dropped from 10.1 yesterday to 9.1 today.  No active bleeding.   -So far patient got 1 unit of PRBC and 2 units of platelets. -Continue to  monitor. Recent Labs  Lab 01/15/21 1114 01/16/21 0547 01/17/21 0446 01/18/21 0446 01/19/21 0552 01/20/21 0520 01/21/21 0623  WBC 0.6*   < > 0.2* 0.1* 0.4* 0.8* 1.5*  NEUTROABS 0.4*  --   --   --  0.1* 0.4* 0.9*  HGB 8.0*   < > 8.2* 7.5* 10.4* 10.1* 9.1*  HCT 24.4*   < > 24.8* 22.1* 30.0* 29.9* 27.3*  MCV 83.8   < > 82.7 80.4 80.0 82.1 82.0  PLT 42*   < > 20* 8* 23* 8* 22*   < > = values in this interval not displayed.   Moderate protein calorie malnutrition -Supplement protein as much as able  Hypokalemia/hypomagnesemia -Potassium magnesium level are low again today.  IV replacement ordered.  Unable to tolerate oral replacement.  Continue to monitor Recent Labs  Lab 01/21/2021 1443 01/15/21 0552 01/16/21 0547 01/17/21 0446 01/18/21 0446 01/19/21 0552 01/20/21 0520 01/21/21 0623  K 2.9*   < > 3.7 4.4 3.2* 3.1* 3.3* 3.3*  MG 1.7  --  1.6*  --  1.3*  --   --  1.3*  PHOS  --   --   --   --   --   --   --  3.3   < > = values in this interval not displayed.   Stage IV small cell lung cancer -Seen on PET 1/28 started on etoposide, carboplatin and atezolizumab 11/22/2020, s/p palliative mediastinal XRT 1/31 - 2/15.  -Oncology, Dr. [name] following  Solid 1.2 cm hypoechoic lesion in the posterosuperior aspect of the right liver: Nonspecific.  -Defer to oncology   LFT  elevation -Multifactorial, improved. Oncology suspects metastatic disease and chemotherapy. RUQ U/S without ductal dilatation -Monitor.  Recent Labs  Lab 02/05/2021 1443 01/15/21 0552 01/16/21 0547 01/17/21 0446 01/19/21 0552  AST 113* 74* 52* 41 20  ALT 479* 338* 276* 215* 109*  ALKPHOS 103 82 89 92 93  BILITOT 2.7* 2.8* 2.5* 2.3* 1.7*  PROT 6.4* 5.4* 5.5* 5.4* 5.7*  ALBUMIN 3.8 3.1* 3.3* 3.2* 3.1*   HTN, tachycardia - Continue IV metoprolol, hopefully could change back to po formulation of medications soon.  Hypothyroidism - Continue synthroid to IV for now. Has had good response following  TSH.  History of breast CA -On antiestrogen which we will hold while unable to take po.  History of covid-19 infection and s/p 2 doses of vaccine -Recommended 3rd dose.  Impaired mobility -PT eval obtained.  SNF recommended.  Mobility: PT eval obtained Code Status:   Code Status: DNR  Nutritional status: Body mass index is 22.71 kg/m. Nutrition Problem: Moderate Malnutrition Etiology: chronic illness,cancer and cancer related treatments Signs/Symptoms: energy intake < or equal to 75% for > or equal to 1 month,percent weight loss,mild fat depletion,mild muscle depletion Diet Order            DIET SOFT Room service appropriate? Yes; Fluid consistency: Thin  Diet effective now                 DVT prophylaxis: SCDs Start: 01/28/2021 1939   Antimicrobials:  IV Rocephin, fluconazole Fluid: NS with potassium at 100 mill per hour Consultants: GI, oncology Family Communication:  None at bedside  Status is: Inpatient  Remains inpatient appropriate because: Continues to have dysphagia, pancytopenia  Dispo: The patient is from: Home              Anticipated d/c is to: Home with home health PT              Patient currently is not medically stable to d/c.   Difficult to place patient No   Infusions:  . 0.9 % NaCl with KCl 20 mEq / L 100 mL/hr at 01/21/21 1125  . fluconazole (DIFLUCAN) IV 200 mg (01/21/21 1124)    Scheduled Meds: . Chlorhexidine Gluconate Cloth  6 each Topical Daily  . feeding supplement  1 Container Oral TID BM  . fentaNYL  1 patch Transdermal Q72H  . levothyroxine  50 mcg Intravenous Daily  . mouth rinse  15 mL Mouth Rinse BID  . metoprolol tartrate  5 mg Intravenous Q6H  . nystatin  5 mL Oral QID  . pantoprazole (PROTONIX) IV  40 mg Intravenous Q24H  . sodium chloride flush  10-40 mL Intracatheter Q12H  . sodium chloride flush  3 mL Intravenous Q12H  . sucralfate  1 g Oral TID WC & HS    Antimicrobials: Anti-infectives (From admission, onward)    Start     Dose/Rate Route Frequency Ordered Stop   01/16/21 1000  fluconazole (DIFLUCAN) IVPB 200 mg        200 mg 100 mL/hr over 60 Minutes Intravenous Daily 01/15/21 1024     01/15/21 1300  cefTRIAXone (ROCEPHIN) 1 g in sodium chloride 0.9 % 100 mL IVPB  Status:  Discontinued        1 g 200 mL/hr over 30 Minutes Intravenous Every 24 hours 01/15/21 1210 01/21/21 1210   01/15/21 1100  fluconazole (DIFLUCAN) IVPB 400 mg        400 mg 100 mL/hr over 120 Minutes Intravenous  Once 01/15/21  1024 01/15/21 1326      PRN meds: acetaminophen **OR** [DISCONTINUED] acetaminophen, LORazepam, magic mouthwash w/lidocaine, ondansetron (ZOFRAN) IV, sodium chloride flush   Objective: Vitals:   01/20/21 2014 01/21/21 0455  BP: (!) 133/102 (!) 167/115  Pulse: (!) 108 (!) 108  Resp: 18 18  Temp: 97.6 F (36.4 C) (!) 97.3 F (36.3 C)  SpO2: 98% 99%    Intake/Output Summary (Last 24 hours) at 01/21/2021 1303 Last data filed at 01/21/2021 0500 Gross per 24 hour  Intake 386 ml  Output 1950 ml  Net -1564 ml   Filed Weights   01/16/21 0816  Weight: 51 kg   Weight change:  Body mass index is 22.71 kg/m.   Physical Exam: General exam: Pleasant elderly Caucasian female.  Persistent dysphagia Skin: No rashes, lesions or ulcers. HEENT: Atraumatic, normocephalic, no obvious bleeding.  No oral thrush on exam.  No bleeding Lungs: Clear to auscultation bilaterally CVS: Regular rate and rhythm GI/Abd soft, nontender, nondistended, bowel sound present CNS: Alert, awake, oriented x3 Psychiatry: Depressed Extremities: No pedal edema, no calf tenderness  Data Review: I have personally reviewed the laboratory data and studies available.  Recent Labs  Lab 01/15/21 1114 01/16/21 0547 01/17/21 0446 01/18/21 0446 01/19/21 0552 01/20/21 0520 01/21/21 0623  WBC 0.6*   < > 0.2* 0.1* 0.4* 0.8* 1.5*  NEUTROABS 0.4*  --   --   --  0.1* 0.4* 0.9*  HGB 8.0*   < > 8.2* 7.5* 10.4* 10.1* 9.1*  HCT 24.4*    < > 24.8* 22.1* 30.0* 29.9* 27.3*  MCV 83.8   < > 82.7 80.4 80.0 82.1 82.0  PLT 42*   < > 20* 8* 23* 8* 22*   < > = values in this interval not displayed.   Recent Labs  Lab 02/07/2021 1443 01/15/21 0552 01/16/21 0547 01/17/21 0446 01/18/21 0446 01/19/21 0552 01/20/21 0520 01/21/21 0623  NA 136   < > 134* 134* 128* 132* 134* 134*  K 2.9*   < > 3.7 4.4 3.2* 3.1* 3.3* 3.3*  CL 106   < > 104 104 97* 98 100 103  CO2 17*   < > 20* 19* 18* 20* 21* 19*  GLUCOSE 113*   < > 95 95 88 94 95 96  BUN 17   < > 9 8 8  7* 8 8  CREATININE 0.84   < > 0.47 0.67 0.56 0.51 0.56 0.47  CALCIUM 8.7*   < > 8.4* 8.3* 8.0* 8.1* 8.3* 7.9*  MG 1.7  --  1.6*  --  1.3*  --   --  1.3*  PHOS  --   --   --   --   --   --   --  3.3   < > = values in this interval not displayed.    F/u labs ordered Unresulted Labs (From admission, onward)          Start     Ordered   01/22/21 0500  Magnesium  Tomorrow morning,   STAT       Question:  Specimen collection method  Answer:  Unit=Unit collect   01/21/21 1303   01/21/21 8250  Basic metabolic panel  Daily,   R     Question:  Specimen collection method  Answer:  Unit=Unit collect   01/20/21 1436   01/21/21 0500  CBC with Differential/Platelet  Daily,   R     Question:  Specimen collection method  Answer:  Unit=Unit collect  01/20/21 1436          Signed, Terrilee Croak, MD [facility] 01/21/2021

## 2021-01-22 ENCOUNTER — Other Ambulatory Visit: Payer: Medicare Other | Admitting: Internal Medicine

## 2021-01-22 DIAGNOSIS — D701 Agranulocytosis secondary to cancer chemotherapy: Secondary | ICD-10-CM | POA: Diagnosis not present

## 2021-01-22 DIAGNOSIS — R1310 Dysphagia, unspecified: Secondary | ICD-10-CM | POA: Diagnosis not present

## 2021-01-22 DIAGNOSIS — T451X5A Adverse effect of antineoplastic and immunosuppressive drugs, initial encounter: Secondary | ICD-10-CM | POA: Diagnosis not present

## 2021-01-22 LAB — CBC WITH DIFFERENTIAL/PLATELET
Abs Immature Granulocytes: 0.34 10*3/uL — ABNORMAL HIGH (ref 0.00–0.07)
Basophils Absolute: 0 10*3/uL (ref 0.0–0.1)
Basophils Relative: 1 %
Eosinophils Absolute: 0 10*3/uL (ref 0.0–0.5)
Eosinophils Relative: 0 %
HCT: 27.7 % — ABNORMAL LOW (ref 36.0–46.0)
Hemoglobin: 9.2 g/dL — ABNORMAL LOW (ref 12.0–15.0)
Immature Granulocytes: 16 %
Lymphocytes Relative: 7 %
Lymphs Abs: 0.1 10*3/uL — ABNORMAL LOW (ref 0.7–4.0)
MCH: 27.2 pg (ref 26.0–34.0)
MCHC: 33.2 g/dL (ref 30.0–36.0)
MCV: 82 fL (ref 80.0–100.0)
Monocytes Absolute: 0.5 10*3/uL (ref 0.1–1.0)
Monocytes Relative: 23 %
Neutro Abs: 1.1 10*3/uL — ABNORMAL LOW (ref 1.7–7.7)
Neutrophils Relative %: 53 %
Platelets: 18 10*3/uL — CL (ref 150–400)
RBC: 3.38 MIL/uL — ABNORMAL LOW (ref 3.87–5.11)
RDW: 15.6 % — ABNORMAL HIGH (ref 11.5–15.5)
WBC: 2.1 10*3/uL — ABNORMAL LOW (ref 4.0–10.5)
nRBC: 0 % (ref 0.0–0.2)

## 2021-01-22 LAB — BASIC METABOLIC PANEL
Anion gap: 9 (ref 5–15)
BUN: 8 mg/dL (ref 8–23)
CO2: 18 mmol/L — ABNORMAL LOW (ref 22–32)
Calcium: 7.8 mg/dL — ABNORMAL LOW (ref 8.9–10.3)
Chloride: 107 mmol/L (ref 98–111)
Creatinine, Ser: 0.51 mg/dL (ref 0.44–1.00)
GFR, Estimated: >60 mL/min (ref >60)
Glucose, Bld: 104 mg/dL — ABNORMAL HIGH (ref 70–99)
Potassium: 3.2 mmol/L — ABNORMAL LOW (ref 3.5–5.1)
Sodium: 134 mmol/L — ABNORMAL LOW (ref 135–145)

## 2021-01-22 LAB — MAGNESIUM: Magnesium: 1.9 mg/dL (ref 1.7–2.4)

## 2021-01-22 MED ORDER — ENSURE ENLIVE PO LIQD
237.0000 mL | Freq: Three times a day (TID) | ORAL | Status: DC
  Administered 2021-01-25: 237 mL via ORAL

## 2021-01-22 NOTE — Progress Notes (Signed)
[facility] Gastroenterology Progress Note  Tara Rodgers 75 y.o. March 28, 1946  CC:  Dysphagia, odynophagia  Subjective: Patient reports continued dysphagia and odynophagia.  Reports spitting up "phlegm."  She reports nausea but denies vomiting.  Denies hematemesis, melena, or hematochezia.  Denies abdominal pain.  ROS : Review of Systems  Cardiovascular: Negative for chest pain and palpitations.  Gastrointestinal: Positive for nausea. Negative for abdominal pain, blood in stool, constipation, diarrhea, heartburn, melena and vomiting.   Objective: Vital signs in last 24 hours: Vitals:   01/22/21 0413 01/22/21 0753  BP: (!) 154/97 (!) 161/104  Pulse: (!) 116 (!) 108  Resp: 18 16  Temp: 98.6 F (37 C) 97.6 F (36.4 C)  SpO2: 100% 100%    Physical Exam:  General:  Lethargic oriented, chronically-ill appearing, no distress  Head:  Normocephalic, without obvious abnormality, atraumatic  Eyes:  Anicteric sclera, EOMs intact  Lungs:   Clear to auscultation bilaterally, respirations unlabored  Heart:  Tachycardic  Abdomen:   Soft, non-tender, non-distended, bowel sounds active all four quadrants  Extremities: Extremities normal, atraumatic, no  edema  Pulses: 2+ and symmetric    Lab Results: Recent Labs    01/21/21 0623 01/22/21 0618  NA 134* 134*  K 3.3* 3.2*  CL 103 107  CO2 19* 18*  GLUCOSE 96 104*  BUN 8 8  CREATININE 0.47 0.51  CALCIUM 7.9* 7.8*  MG 1.3* 1.9  PHOS 3.3  --    No results for input(s): AST, ALT, ALKPHOS, BILITOT, PROT, ALBUMIN in the last 72 hours. Recent Labs    01/21/21 0623 01/22/21 0618  WBC 1.5* 2.1*  NEUTROABS 1.2* 1.1*  HGB 9.1* 9.2*  HCT 27.3* 27.7*  MCV 82.0 82.0  PLT 22* 18*   No results for input(s): LABPROT, INR in the last 72 hours.   Assessment: Dysphagia, odynophagia  Thrombocytopenia: Platelts 18 K/uL  Leukopenia: WBCs 2.1  Small cell lung cancer status post radiation therapy (completed about 2 months ago) and now on  chemotherapy has had 3 out of 4 cycles)  Plan: Plan for EGD tomorrow if platelets >20K/uL.  I thoroughly discussed the procedure with the patient to include nature, alternatives, benefits, and risks (including but not limited to bleeding, infection, perforation, anesthesia/cardiac and pulmonary complications).   Check PT/INR tomorrow.  Plan for transfusion of 1-2u platelets in the morning.  Continue Protonix IV.  Full liquids with NPO after midnight.  [facility] will follow.   Salley Slaughter PA-C 01/22/2021, 10:32 AM  Contact #  [phone]

## 2021-01-22 NOTE — Progress Notes (Signed)
PROGRESS NOTE  Tara Rodgers  DOB: March 24, 1946  PCP: Elby Showers, MD WUJ:[id number]  DOA: 01/22/2021  LOS: 7 days   Chief Complaint  Patient presents with  . Emesis  . Nausea  . Dysphagia    Brief narrative: Tara Rodgers is a 75 y.o. female with PMH significant for metastatic SCLC undergoing chemotherapy s/p XRT, transfusion-dependent anemia, thrombocytopenia, right breast CA s/p mastectomy 2016 on antiestrogen therapy. Patient presented to the ED on 01/16/2021 with complaint of 3 weeks of constant, worsening, severe dysphagia, globus sensation, and odynophagia that has limited per oral intake severely to the point that she was not able to tolerate any po and not able to take her medications.  In the ED, she appeared dehydrated, tachycardic to 130s, afebrile, normotensive, benign abdominal exam Labs with ketones in urine, potassium low at 2.9, metabolic acidosis, LFTs elevated.   Patient was admitted to hospitalist service GI consultation was obtained. EGD were tentatively planned but could not be done because of subsequent pancytopenia.  Subjective: Patient was seen and examined this morning. Sitting up in bed.  Not in distress.  Continues to have dysphagia.  Tolerated only two spoons of applesauce yesterday.  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 -Continues to have dysphagia despite empiric treatment with nystatin, diflucan IV, Magic mouthwash and Carafate.  Patient tried applesauce yesterday.  Only tolerated 2 spoons -GI follow-up called.  Noted a tentative plan for EGD tomorrow.  Pancytopenia -CBC being monitored.  Labs as below.  Seem to be gradually improving. -No active bleeding. -So far patient got 1 unit of PRBC and 2 units of platelets. -Continue to monitor. Recent Labs  Lab 01/15/21 1114 01/16/21 0547 01/18/21 0446 01/19/21 0552 01/20/21 0520 01/21/21 0623 01/22/21 0618  WBC 0.6*   < > 0.1* 0.4* 0.8* 1.5* 2.1*   NEUTROABS 0.4*  --   --  0.1* 0.4* 1.2* 1.1*  HGB 8.0*   < > 7.5* 10.4* 10.1* 9.1* 9.2*  HCT 24.4*   < > 22.1* 30.0* 29.9* 27.3* 27.7*  MCV 83.8   < > 80.4 80.0 82.1 82.0 82.0  PLT 42*   < > 8* 23* 8* 22* 18*   < > = values in this interval not displayed.   Moderate protein calorie malnutrition -Supplement protein as much as able  Hypokalemia/hypomagnesemia -Potassium magnesium level are low again today.  IV replacement ordered.  Unable to tolerate oral replacement.  Continue to monitor Recent Labs  Lab 01/16/21 0547 01/17/21 0446 01/18/21 0446 01/19/21 0552 01/20/21 0520 01/21/21 0623 01/22/21 0618  K 3.7   < > 3.2* 3.1* 3.3* 3.3* 3.2*  MG 1.6*  --  1.3*  --   --  1.3* 1.9  PHOS  --   --   --   --   --  3.3  --    < > = values in this interval not displayed.   Stage IV small cell lung cancer -Seen on PET 1/28 started on etoposide, carboplatin and atezolizumab 11/22/2020, s/p palliative mediastinal XRT 1/31 - 2/15.  -Oncology, Dr. [name] following  Solid 1.2 cm hypoechoic lesion in the posterosuperior aspect of the right liver: Nonspecific.  -Defer to oncology   LFT elevation -Multifactorial, improved. Oncology suspects metastatic disease and chemotherapy. RUQ U/S without ductal dilatation -Monitor.  Recent Labs  Lab 01/16/21 0547 01/17/21 0446 01/19/21 0552  AST 52* 41 20  ALT 276* 215* 109*  ALKPHOS 89 92 93  BILITOT 2.5* 2.3* 1.7*  PROT 5.5* 5.4*  5.7*  ALBUMIN 3.3* 3.2* 3.1*   HTN, tachycardia - Continues to have tachycardia probably related to dehydration.  IV metoprolol, hopefully could change back to po formulation of medications soon.  Hypothyroidism - Continue synthroid to IV for now. Has had good response following TSH.  History of breast CA -On antiestrogen which we will hold while unable to take po.  History of covid-19 infection and s/p 2 doses of vaccine -Recommended 3rd dose.  Impaired mobility -PT eval obtained.  SNF  recommended.  Mobility: PT eval obtained Code Status:   Code Status: DNR  Nutritional status: Body mass index is 22.71 kg/m. Nutrition Problem: Moderate Malnutrition Etiology: chronic illness,cancer and cancer related treatments Signs/Symptoms: energy intake < or equal to 75% for > or equal to 1 month,percent weight loss,mild fat depletion,mild muscle depletion Diet Order            Diet NPO time specified  Diet effective midnight           DIET SOFT Room service appropriate? Yes; Fluid consistency: Thin  Diet effective now                 DVT prophylaxis: SCDs Start: 01/16/2021 1939   Antimicrobials:  fluconazole Fluid: NS with potassium at 100 mill per hour Consultants: GI, oncology Family Communication:  None at bedside  Status is: Inpatient  Remains inpatient appropriate because: Continues to have dysphagia, pancytopenia  Dispo: The patient is from: Home              Anticipated d/c is to: Home with home health PT              Patient currently is not medically stable to d/c.   Difficult to place patient No   Infusions:  . 0.9 % NaCl with KCl 20 mEq / L 100 mL/hr at 01/21/21 2209  . fluconazole (DIFLUCAN) IV 200 mg (01/22/21 4627)    Scheduled Meds: . Chlorhexidine Gluconate Cloth  6 each Topical Daily  . feeding supplement  1 Container Oral TID BM  . fentaNYL  1 patch Transdermal Q72H  . levothyroxine  50 mcg Intravenous Daily  . mouth rinse  15 mL Mouth Rinse BID  . metoprolol tartrate  5 mg Intravenous Q6H  . nystatin  5 mL Oral QID  . pantoprazole (PROTONIX) IV  40 mg Intravenous Q24H  . sodium chloride flush  10-40 mL Intracatheter Q12H  . sodium chloride flush  3 mL Intravenous Q12H  . sucralfate  1 g Oral TID WC & HS    Antimicrobials: Anti-infectives (From admission, onward)   Start     Dose/Rate Route Frequency Ordered Stop   01/16/21 1000  fluconazole (DIFLUCAN) IVPB 200 mg        200 mg 100 mL/hr over 60 Minutes Intravenous Daily 01/15/21  1024     01/15/21 1300  cefTRIAXone (ROCEPHIN) 1 g in sodium chloride 0.9 % 100 mL IVPB  Status:  Discontinued        1 g 200 mL/hr over 30 Minutes Intravenous Every 24 hours 01/15/21 1210 01/21/21 1210   01/15/21 1100  fluconazole (DIFLUCAN) IVPB 400 mg        400 mg 100 mL/hr over 120 Minutes Intravenous  Once 01/15/21 1024 01/15/21 1326      PRN meds: acetaminophen **OR** [DISCONTINUED] acetaminophen, LORazepam, magic mouthwash w/lidocaine, ondansetron (ZOFRAN) IV, sodium chloride flush   Objective: Vitals:   01/22/21 0413 01/22/21 0753  BP: (!) 154/97 (!) 161/104  Pulse: Marland Kitchen)  116 (!) 108  Resp: 18 16  Temp: 98.6 F (37 C) 97.6 F (36.4 C)  SpO2: 100% 100%    Intake/Output Summary (Last 24 hours) at 01/22/2021 1053 Last data filed at 01/22/2021 0610 Gross per 24 hour  Intake 4411.59 ml  Output 2250 ml  Net 2161.59 ml   Filed Weights   01/16/21 0816  Weight: 51 kg   Weight change:  Body mass index is 22.71 kg/m.   Physical Exam: General exam: Pleasant elderly Caucasian female.  Persistent dysphagia Skin: No rashes, lesions or ulcers. HEENT: Atraumatic, normocephalic, no obvious bleeding.  No oral thrush on exam.  No bleeding Lungs: Clear to auscultation bilaterally CVS: Regular rate and rhythm GI/Abd soft, nontender, nondistended, bowel sound present CNS: Alert, awake, oriented x3 Psychiatry: Depressed, frustrated. Extremities: No pedal edema, no calf tenderness  Data Review: I have personally reviewed the laboratory data and studies available.  Recent Labs  Lab 01/15/21 1114 01/16/21 0547 01/18/21 0446 01/19/21 0552 01/20/21 0520 01/21/21 0623 01/22/21 0618  WBC 0.6*   < > 0.1* 0.4* 0.8* 1.5* 2.1*  NEUTROABS 0.4*  --   --  0.1* 0.4* 1.2* 1.1*  HGB 8.0*   < > 7.5* 10.4* 10.1* 9.1* 9.2*  HCT 24.4*   < > 22.1* 30.0* 29.9* 27.3* 27.7*  MCV 83.8   < > 80.4 80.0 82.1 82.0 82.0  PLT 42*   < > 8* 23* 8* 22* 18*   < > = values in this interval not displayed.    Recent Labs  Lab 01/16/21 0547 01/17/21 0446 01/18/21 0446 01/19/21 0552 01/20/21 0520 01/21/21 0623 01/22/21 0618  NA 134*   < > 128* 132* 134* 134* 134*  K 3.7   < > 3.2* 3.1* 3.3* 3.3* 3.2*  CL 104   < > 97* 98 100 103 107  CO2 20*   < > 18* 20* 21* 19* 18*  GLUCOSE 95   < > 88 94 95 96 104*  BUN 9   < > 8 7* 8 8 8   CREATININE 0.47   < > 0.56 0.51 0.56 0.47 0.51  CALCIUM 8.4*   < > 8.0* 8.1* 8.3* 7.9* 7.8*  MG 1.6*  --  1.3*  --   --  1.3* 1.9  PHOS  --   --   --   --   --  3.3  --    < > = values in this interval not displayed.    F/u labs ordered Unresulted Labs (From admission, onward)          Start     Ordered   01/21/21 0240  Basic metabolic panel  Daily,   R     Question:  Specimen collection method  Answer:  Unit=Unit collect   01/20/21 1436   01/21/21 0500  CBC with Differential/Platelet  Daily,   R     Question:  Specimen collection method  Answer:  Unit=Unit collect   01/20/21 1436          Signed, Terrilee Croak, MD [facility] 01/22/2021

## 2021-01-22 NOTE — Progress Notes (Addendum)
Nutrition Follow-up  DOCUMENTATION CODES:   Non-severe (moderate) malnutrition in context of chronic illness  INTERVENTION:   -d/c Boost Breeze  -Ensure Enlive po BID, each supplement provides 350 kcal and 20 grams of protein  NUTRITION DIAGNOSIS:   Moderate Malnutrition related to chronic illness,cancer and cancer related treatments as evidenced by energy intake < or equal to 75% for > or equal to 1 month,percent weight loss,mild fat depletion,mild muscle depletion.  Ongoing.  GOAL:   Patient will meet greater than or equal to 90% of their needs  Not meeting.  MONITOR:   PO intake,Supplement acceptance,Weight trends,Labs,I & O's  REASON FOR ASSESSMENT:   Malnutrition Screening Tool    ASSESSMENT:   75 y.o. female with a history of metastatic SCLC undergoing chemotherapy s/p XRT, transfusion-dependent anemia, thrombocytopenia, right breast CA s/p mastectomy 2016 on antiestrogen therapy who has experienced 3 weeks of constant, worsening, severe dysphagia, globus sensation, and odynophagia that has limited per oral intake severely to the point that she isn't able to tolerate any po and not able to take her medications. In the ED she appeared dehydrated with ketones in urine.  Patient now on full liquids and will be NPO tomorrow for EGD per GI note. Pt has continued to struggle to eat, having small amounts of liquids only. Declining Boost Breeze, will change this to Ensure.  May need to consider feeding tube placement if PO does not improve. At risk of worsening malnutrition. Noted that Ver[place] RD spoke with pt about this recommendation on 3/28 and pt refused to consider feeding tube.  Admission weight: 112 lbs. No new weights for admission.  Medications: Carafate  Labs reviewed: Low Na, K  Diet Order:   Diet Order            Diet NPO time specified  Diet effective midnight           Diet full liquid Room service appropriate? Yes; Fluid consistency: Thin   Diet effective now                 EDUCATION NEEDS:   Education needs have been addressed  Skin:  Skin Assessment: Reviewed RN Assessment  Last BM:  4/2  Height:   Ht Readings from Last 1 Encounters:  01/16/21 4\' 11"  (1.499 m)    Weight:   Wt Readings from Last 1 Encounters:  01/16/21 51 kg   BMI:  Body mass index is 22.71 kg/m.  Estimated Nutritional Needs:   Kcal:  1600-1800  Protein:  80-95g  Fluid:  1.8L/day  Clayton Bibles, MS, RD, LDN Inpatient Clinical Dietitian Contact information available via Amion

## 2021-01-22 NOTE — Progress Notes (Signed)
Physical Therapy Treatment Patient Details Name: Tara Rodgers MRN: [id number] DOB: 03/06/1946 Today's Date: 01/22/2021    History of Present Illness Pt is 75 y.o. female admitted with 3 weeks of constant, worsening, severe dysphagia, globus sensation, and odynophagia that has limited per oral intake severely to the point that she isn't able to tolerate any po and not able to take her medications. Admitted on 01/31/2021 with Admitted with dysphagia, odynophagia, pancytopenia, and hypokalemia. PMH of metastatic SCLC undergoing chemotherapy s/p XRT, transfusion-dependent anemia, thrombocytopenia, right breast CA s/p mastectomy 2016.    PT Comments    Pt reports she is trying to eat however has not had much success.  Pt assisted with stand pivot to/from rollator seat.  Pt declined ambulating or further mobility today due to fatigue.   Follow Up Recommendations  Home health PT;Supervision for mobility/OOB     Equipment Recommendations  None recommended by PT    Recommendations for Other Services       Precautions / Restrictions Precautions Precautions: Fall    Mobility  Bed Mobility Overal bed mobility: Needs Assistance Bed Mobility: Supine to Sit;Sit to Supine     Supine to sit: Supervision;HOB elevated Sit to supine: Supervision;HOB elevated   General bed mobility comments: HOB elevated and use of bed rail, increased time but no physical assist    Transfers Overall transfer level: Needs assistance Equipment used: Rolling walker (2 wheeled) Transfers: Sit to/from [facility] Sit to Stand: Min assist Stand pivot transfers: Min assist       General transfer comment: pt required cue for correct use of brakes for rollator, pt performed stand pivot to sit down on rollator, declined ambulating or rolling into hallway, rested and then transferred back to bed  Ambulation/Gait                 Stairs             Wheelchair Mobility    Modified  [place] (Stroke Patients Only)       Balance Overall balance assessment: Needs assistance         Standing balance support: Bilateral upper extremity supported Standing balance-Leahy Scale: Poor                              Cognition Arousal/Alertness: Awake/alert Behavior During Therapy: Flat affect Overall Cognitive Status: Within Functional Limits for tasks assessed                                        Exercises      General Comments        Pertinent Vitals/Pain Pain Assessment: No/denies pain    Home Living                      Prior Function            PT Goals (current goals can now be found in the care plan section) Progress towards PT goals: Progressing toward goals    Frequency    Min 3X/week      PT Plan Discharge plan needs to be updated    Co-evaluation              AM-PAC PT "6 Clicks" Mobility   Outcome Measure  Help needed turning from your back to your side while in a flat  bed without using bedrails?: A Little Help needed moving from lying on your back to sitting on the side of a flat bed without using bedrails?: A Little Help needed moving to and from a bed to a chair (including a wheelchair)?: A Little Help needed standing up from a chair using your arms (e.g., wheelchair or bedside chair)?: A Little Help needed to walk in hospital room?: A Lot Help needed climbing 3-5 steps with a railing? : Total 6 Click Score: 15    End of Session Equipment Utilized During Treatment: Gait belt Activity Tolerance: Patient limited by fatigue Patient left: with call bell/phone within reach;in bed;with bed alarm set   PT Visit Diagnosis: Difficulty in walking, not elsewhere classified (R26.2);Muscle weakness (generalized) (M62.81)     Time: 5374-8270 PT Time Calculation (min) (ACUTE ONLY): 14 min  Charges:  $Therapeutic Activity: 8-22 mins                     Jannette Spanner PT, DPT Acute Rehabilitation  Services Pager: [phone] Office: [phone]  York Ram E 01/22/2021, 12:16 PM

## 2021-01-22 NOTE — H&P (View-Only) (Signed)
[facility] Gastroenterology Progress Note  Tara Rodgers 75 y.o. Mar 05, 1946  CC:  Dysphagia, odynophagia  Subjective: Patient reports continued dysphagia and odynophagia.  Reports spitting up "phlegm."  She reports nausea but denies vomiting.  Denies hematemesis, melena, or hematochezia.  Denies abdominal pain.  ROS : Review of Systems  Cardiovascular: Negative for chest pain and palpitations.  Gastrointestinal: Positive for nausea. Negative for abdominal pain, blood in stool, constipation, diarrhea, heartburn, melena and vomiting.   Objective: Vital signs in last 24 hours: Vitals:   01/22/21 0413 01/22/21 0753  BP: (!) 154/97 (!) 161/104  Pulse: (!) 116 (!) 108  Resp: 18 16  Temp: 98.6 F (37 C) 97.6 F (36.4 C)  SpO2: 100% 100%    Physical Exam:  General:  Lethargic oriented, chronically-ill appearing, no distress  Head:  Normocephalic, without obvious abnormality, atraumatic  Eyes:  Anicteric sclera, EOMs intact  Lungs:   Clear to auscultation bilaterally, respirations unlabored  Heart:  Tachycardic  Abdomen:   Soft, non-tender, non-distended, bowel sounds active all four quadrants  Extremities: Extremities normal, atraumatic, no  edema  Pulses: 2+ and symmetric    Lab Results: Recent Labs    01/21/21 0623 01/22/21 0618  NA 134* 134*  K 3.3* 3.2*  CL 103 107  CO2 19* 18*  GLUCOSE 96 104*  BUN 8 8  CREATININE 0.47 0.51  CALCIUM 7.9* 7.8*  MG 1.3* 1.9  PHOS 3.3  --    No results for input(s): AST, ALT, ALKPHOS, BILITOT, PROT, ALBUMIN in the last 72 hours. Recent Labs    01/21/21 0623 01/22/21 0618  WBC 1.5* 2.1*  NEUTROABS 1.2* 1.1*  HGB 9.1* 9.2*  HCT 27.3* 27.7*  MCV 82.0 82.0  PLT 22* 18*   No results for input(s): LABPROT, INR in the last 72 hours.   Assessment: Dysphagia, odynophagia  Thrombocytopenia: [place]ts 18 K/uL  Leukopenia: WBCs 2.1  Small cell lung cancer status post radiation therapy (completed about 2 months ago) and now on  chemotherapy has had 3 out of 4 cycles)  Plan: Plan for EGD tomorrow if platelets >20K/uL.  I thoroughly discussed the procedure with the patient to include nature, alternatives, benefits, and risks (including but not limited to bleeding, infection, perforation, anesthesia/cardiac and pulmonary complications).   Check PT/INR tomorrow.  Plan for transfusion of 1-2u platelets in the morning.  Continue Protonix IV.  Full liquids with NPO after midnight.  [facility] will follow.   Salley Slaughter PA-C 01/22/2021, 10:32 AM  Contact #  [phone]

## 2021-01-22 NOTE — Progress Notes (Addendum)
HEMATOLOGY-ONCOLOGY PROGRESS NOTE  SUBJECTIVE: The patient continues to have dysphagia and odynophagia.  She has a soft diet this morning but states that she cannot eat any of it.  She is taking in very small amounts of liquid.  Denies abdominal pain, nausea, vomiting. No fevers or chills.  Denies bleeding.   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 [facility]) Staging form: Breast, AJCC 7th Edition - Clinical: Stage IIA (T2, N0, M0) - Signed by [name], MD on 02/08/2015 Laterality: Right Estrogen receptor status: Positive Progesterone receptor status: Positive HER2 status: Negative - Pathologic stage from 03/23/2015: Stage Unknown (T2, NX, cM0) - Unsigned  Small cell lung cancer ([place]) Staging form: Lung, AJCC 8th Edition - Clinical stage from 11/13/2020: Stage IV (cTX, cN3, cM1) - Signed by [name], MD on 11/16/2020 Stage prefix: Initial diagnosis     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) and adjuvant radiation ([name]).   12/13/2014 Mammogram   Right breast: possible asymmetry warranting further evaluation with spot compression views and possibly ultrasound   12/16/2014 Breast US   Right breast: no definitive abnormality within the superior aspect of the right breast to correspond with the mammographic finding.   01/10/2015 Breast MRI   Right breast: irregular rim enhancing mass within the superior right breast at 11:30 measuring 2.5 x 2.4 x 2.4 cm. No abnormal enhancement is identified within the skin of the superior right breast in the area of concern on mammogram.   01/12/2015 Breast US   Second look diagnostic mammogram and ultrasound showed a 2.3 cm mass in the upper midline  right breast. No axillary adenopathy.   01/12/2015 Initial Biopsy   Right breast needle core bx (upper midline): Invasive ductal carcinoma, grade 2, ER+ (100%), PR+ (77%), HER2/neu negative (ratio 1.15), Ki67 20%    01/30/2015 Procedure   Breast High/Moderate Risk panel ([facility]) reveals no clinically significant variant at ATM, BRCA1, BRCA2, CDH1, CHEK2, PALB2, PTEN, STK11, and TP53.   02/08/2015 Clinical Stage   Stage IIA (T2 N0)   03/23/2015 Definitive Surgery   Right mastectomy ([name]): invasive adenocarcinoma, grade 3, 2.9 cm, HER2/neu repeated, negative (ratio 1.23)   03/23/2015 Oncotype testing   Score: 8 (6% ROR). No chemotherapy [name]).   03/23/2015 Pathologic Stage   Stage IIA: pT2 pNx   05/04/2015 -  Anti-estrogen oral therapy   Letrozole 2.74m daily. Planned duration of therapy at least 5 years.    07/25/2015 Survivorship   Survivorship visit completed and copy of care plan provided to patient.   12/15/2015 Mammogram   IMPRESSION: No mammographic evidence of malignancy. A result letter of this screening mammogram will be mailed directly to the patient.   12/23/2016 Mammogram   IMPRESSION: No mammographic evidence of malignancy. A result letter of this screening mammogram will be mailed directly to the patient.    12/30/2017 Mammogram   12/30/2017 Mammogram IMPRESSION: No mammographic evidence of malignancy. A result letter of this screening mammogram will be mailed directly to the patient.   03/27/2020 Mammogram   IMPRESSION: No mammographic evidence of malignancy. A result letter of this screening mammogram will be mailed directly to the patient.   10/26/2020 Imaging   CT chest IMPRESSION: 1. Bulky mediastinal, thoracic inlet, right supraclavicular, and  left axillary lymphadenopathy consistent with metastatic disease. Associated numerous bilateral pulmonary nodules and masses, most prominently in the right middle lobe with dominant 4.1 cm peripheral mass  lesion. 2. 16 mm rim enhancing lesion posterior right liver consistent with metastatic involvement. 3. 13 mm hypoenhancing lesion in the tail of pancreas. Primary neoplasm or metastatic involvement could have this appearance. 4. Multiple nodules in the upper abdomen concerning for peritoneal/mesenteric metastatic disease. 5. Small right and tiny left pleural effusions with bibasilar atelectasis. 6. Aortic Atherosclerosis (ICD10-I70.0).   11/10/2020 PET scan   IMPRESSION: 1. Extensive hypermetabolic multi organ metastatic disease. 2. Bulky intensely hypermetabolic RIGHT supraclavicular, mediastinal and LEFT axillary adenopathy. 3. Hypermetabolic pulmonary mass in the RIGHT upper lobe with additional hypermetabolic nodules. 4. Hypermetabolic solitary hepatic metastasis. 5. Hypermetabolic lesion within the pancreas is also favored metastatic lesion. 6. Multifocal hypermetabolic skeletal metastasis. 7. Bilateral pleural effusions.   11/13/2020 Procedure   Thoracentesis  IMPRESSION: Successful ultrasound guided right thoracentesis yielding 200 mL of pleural fluid.   Small cell lung cancer ([place])  11/13/2020 Pathology Results   Right supraclavicular LN Biopsy  FINAL MICROSCOPIC DIAGNOSIS:   A. LYMPH NODE, RIGHT SUPRACLAVICULAR, NEEDLE CORE BIOPSY:  - Most consistent with small cell carcinoma.   COMMENT:   Tumor is limited in quantity, and partially necrotic.  TTF-1 and  Synaptophysin are positive.  Ki-67 proliferation index reaches 80-90%.  GATA-3, ER and p40 are negative.  The morphologic and immunophenotypic  characteristics are most suggestive of small cell carcinoma.  TTF-1  positive staining is compatible with origin from the lung; however, it  does not exclude origin from other entities.  Please note that distinct  nodal tissue is not identified in the submitted material.  Results  reported to Dr. [name] on 11/16/2020.  Dr. Melina Copa reviewed the case   11/13/2020 Cancer  Staging   Staging form: Lung, AJCC 8th Edition - Clinical stage from 11/13/2020: Stage IV (cTX, cN3, cM1) - Signed by [name], MD on 11/16/2020 Stage prefix: Initial diagnosis   11/13/2020 - 11/28/2020 Radiation Therapy   palliative radiation to [facility] with Dr Lisbeth Renshaw 11/13/20-11/28/20   11/16/2020 Initial Diagnosis   Small cell lung cancer ([place])   11/22/2020 -  Chemotherapy   First line etoposide D1-3, carboplatin and atezolizumab D1 q3weeks beginning 11/22/20.        11/29/2020 Procedure   PAC placed on 11/29/20   01/01/2021 Imaging   IMPRESSION: 1. Significant interval decrease in size of the right middle lobe lung mass and significant interval decrease in size of the mediastinal and right hilar lymph nodes. 2. Resolution of right supraclavicular and left axillary adenopathy. 3. Stable miliary pattern of pulmonary nodules. No new or progressive findings. 4. Stable diffuse sclerotic metastatic bone disease. No findings for progression. 5. Interval decrease in size of the pancreatic mass and adjacent lymph node. 6. Slightly smaller segment 7 liver lesion. No new or progressive findings. 7. Emphysema and aortic atherosclerosis.   Aortic Atherosclerosis (ICD10-I70.0) and Emphysema (ICD10-J43.9).        REVIEW OF SYSTEMS:   Constitutional: Denies fevers, chills Eyes: Denies blurriness of vision Ears, nose, mouth, throat, and face: Denies mucositis, has dysphagia and odynophagia. Respiratory: Denies cough, dyspnea or wheezes Cardiovascular: Denies palpitation, chest discomfort Gastrointestinal: Reports severe dysphagia Skin: Denies abnormal skin rashes Lymphatics: Denies new lymphadenopathy or easy bruising Neurological:Denies numbness, tingling or new weaknesses Behavioral/Psych: Mood is stable, no new changes  Extremities: No lower extremity edema All other systems were reviewed with the  patient and are negative.  I have reviewed the past medical history, past surgical  history, social history and family history with the patient and they are unchanged from previous note.   PHYSICAL EXAMINATION: ECOG PERFORMANCE STATUS: 3 - Symptomatic, >50% confined to bed  Vitals:   01/21/21 2356 01/22/21 0413  BP: (!) 156/91 (!) 154/97  Pulse: (!) 116 (!) 116  Resp: 18 18  Temp: 99.2 F (37.3 C) 98.6 F (37 C)  SpO2: 100% 100%   Filed Weights   01/16/21 0816  Weight: 51 kg    Intake/Output from previous day: 04/10 0701 - 04/11 0700 In: 4411.6 [P.O.:240; I.V.:4171.6] Out: 2250 [Urine:2250]  GENERAL: Chronically ill-appearing female, no distress SKIN: skin color, texture, turgor are normal, no rashes or significant lesions, no petechia or ecchymosis EYES: normal, Conjunctiva are pink and non-injected, sclera clear ABDOMEN:abdomen soft, non-tender and normal bowel sounds NEURO: alert & oriented x 3 with fluent speech, no focal motor/sensory deficits  LABORATORY DATA:  I have reviewed the data as listed CMP Latest Ref Rng & Units 01/22/2021 01/21/2021 01/20/2021  Glucose 70 - 99 mg/dL 104(H) 96 95  BUN 8 - 23 mg/dL '8 8 8  ' Creatinine 0.44 - 1.00 mg/dL 0.51 0.47 0.56  Sodium 135 - 145 mmol/L 134(L) 134(L) 134(L)  Potassium 3.5 - 5.1 mmol/L 3.2(L) 3.3(L) 3.3(L)  Chloride 98 - 111 mmol/L 107 103 100  CO2 22 - 32 mmol/L 18(L) 19(L) 21(L)  Calcium 8.9 - 10.3 mg/dL 7.8(L) 7.9(L) 8.3(L)  Total Protein 6.5 - 8.1 g/dL - - -  Total Bilirubin 0.3 - 1.2 mg/dL - - -  Alkaline Phos 38 - 126 U/L - - -  AST 15 - 41 U/L - - -  ALT 0 - 44 U/L - - -    Lab Results  Component Value Date   WBC 2.1 (L) 01/22/2021   HGB 9.2 (L) 01/22/2021   HCT 27.7 (L) 01/22/2021   MCV 82.0 01/22/2021   PLT 18 (LL) 01/22/2021   NEUTROABS 1.1 (L) 01/22/2021    DG Chest 2 View  Result Date: 01/17/2021 CLINICAL DATA:  Dysphagia and chest pain with nausea and gagging as well as vomiting 3 weeks. History of metastatic small cell lung cancer with last chemotherapy 2 weeks ago. EXAM: CHEST  - 2 VIEW COMPARISON:  11/13/2020 and CT 01/01/2021 FINDINGS: Right IJ Port-A-Cath has tip over the SVC. Lungs are adequately inflated without focal airspace consolidation or effusion. Cardiomediastinal silhouette is normal. Stable moderate sclerotic compression fractures over the thoracic and lumbar spine compared to 01/01/2021, although these are new compared to 10/26/2020. IMPRESSION: 1.  No acute cardiopulmonary disease. 2. Several moderate sclerotic compression fractures of the thoracic and lumbar spine new since January 2022 but unchanged from 01/01/2021 likely metastatic in origin. Electronically Signed   By: Marin Olp M.D.   On: 01/17/2021 14:58   CT CHEST ABDOMEN PELVIS W CONTRAST  Result Date: 01/02/2021 CLINICAL DATA:  Small cell lung cancer. History of chemotherapy and radiation therapy. EXAM: CT CHEST, ABDOMEN, AND PELVIS WITH CONTRAST TECHNIQUE: Multidetector CT imaging of the chest, abdomen and pelvis was performed following the standard protocol during bolus administration of intravenous contrast. CONTRAST:  112m OMNIPAQUE IOHEXOL 300 MG/ML  SOLN COMPARISON:  CT abdomen/pelvis 11/28/2020 and PET-CT 11/10/2020 FINDINGS: CT CHEST FINDINGS Cardiovascular: The heart is normal in size. No pericardial effusion. Stable tortuosity and calcification of the thoracic aorta and stable scattered coronary artery calcifications. The right IJ Port-A-Cath is in good position  without complicating features. Mediastinum/Nodes: Much improved mediastinal and right hilar adenopathy. Anterior mediastinal nodal mass measures 20 x 12 mm on image 23/2. This previously measured 4.1 x 3.5 cm. Subcarinal node measures 9.5 mm on image 27/2. This previously measured 20 mm. On the prior PET-CT there is a large right supraclavicular nodal mass. I do not see any residual measurable lymphadenopathy. The hypermetabolic left axillary adenopathy seen on the prior PET-CT has resolved. The esophagus is grossly normal. Lungs/Pleura:  The peripheral right middle lobe lung mass is much smaller. It measures approximately 4.2 x 2.0 cm on the prior CT scan and now measures approximately 2.3 x 1.0 cm. Persistent miliary pattern of pulmonary nodules throughout both lungs. No new lung lesions. No pleural effusions. Musculoskeletal: Stable appearing diffuse sclerotic T7 and T10 pathologic compression fractures are again noted. Do not see any obvious new or progressive metastatic bone disease. Metastatic disease CT ABDOMEN PELVIS FINDINGS Hepatobiliary: 1.7 x 1.4 cm segment 7 liver lesion previously measured approximately 2.3 x 1.9 cm. No new hepatic metastatic lesions. The gallbladder is unremarkable.  No common bile duct dilatation. Pancreas: The 17 mm pancreatic mass in the body tail junction region now measures approximately 7 mm. There was an adjacent lymph node which measured measured 9 mm and this is no longer identified. Spleen: Normal size.  No focal lesions. Adrenals/Urinary Tract: The adrenal glands and kidneys are unremarkable. No evidence of adrenal gland metastatic disease. The bladder is unremarkable. Stomach/Bowel: The stomach, duodenum, small bowel and colon are unremarkable. Vascular/Lymphatic: Stable atherosclerotic calcifications involving the aorta and branch vessels. No aneurysm or dissection. The major venous structures are patent. No mesenteric or retroperitoneal mass or lymphadenopathy. Reproductive: The uterus and ovaries are unremarkable. Other: No pelvic mass or free pelvic fluid collections. No inguinal mass or adenopathy. Musculoskeletal: Stable severe degenerative changes involving the left hip. Stable scattered sclerotic metastatic bone disease without findings for progression. IMPRESSION: 1. Significant interval decrease in size of the right middle lobe lung mass and significant interval decrease in size of the mediastinal and right hilar lymph nodes. 2. Resolution of right supraclavicular and left axillary adenopathy. 3.  Stable miliary pattern of pulmonary nodules. No new or progressive findings. 4. Stable diffuse sclerotic metastatic bone disease. No findings for progression. 5. Interval decrease in size of the pancreatic mass and adjacent lymph node. 6. Slightly smaller segment 7 liver lesion. No new or progressive findings. 7. Emphysema and aortic atherosclerosis. Aortic Atherosclerosis (ICD10-I70.0) and Emphysema (ICD10-J43.9). Electronically Signed   By: Marijo Sanes M.D.   On: 01/02/2021 12:28   US Abdomen Limited RUQ (LIVER/GB)  Result Date: 01/15/2021 CLINICAL DATA:  Elevated LFTs EXAM: ULTRASOUND ABDOMEN LIMITED RIGHT UPPER QUADRANT COMPARISON:  None. FINDINGS: Gallbladder: No gallstones or wall thickening visualized. No sonographic Murphy sign noted by sonographer. Common bile duct: Diameter: Normal at 1.5 mm Liver: Small subtly hypoechoic solid lesion in the posterosuperior aspect of the right liver measures 1.2 x 1.1 cm. Hepatic parenchymal echogenicity and echotexture within normal limits. Portal vein is patent on color Doppler imaging with normal direction of blood flow towards the liver. Other: None. IMPRESSION: 1. Small nonspecific solid 1.2 cm hypoechoic lesion in the posterosuperior aspect of the right liver. Differential considerations are broad and include both malignant and benign neoplastic processes. Recommend further evaluation with gadolinium-enhanced MRI of the abdomen. 2. Otherwise, unremarkable right upper quadrant ultrasound. Electronically Signed   By: Jacqulynn Cadet M.D.   On: 01/15/2021 06:37    ASSESSMENT AND PLAN: 1.  Extensive stage small cell lung cancer 2.  Pancytopenia secondary to chemotherapy 3.  Dysphagia, odynophagia, likely secondary to chemo, radiation and possible Candida esophagitis 4.  Transaminitis and hyperbilirubinemia, improved  5.  Failure of thrive 6.  Hypothyroidism 7.  History of breast cancer 8. DNR   -Neutropenia is stable, ANC 1.1 today.  Should be  adequate for EGD.  Hopefully GI can follow-up again today. -I again encouraged her to try applesauce and Jell-O, she is willing to try.  -Continue supportive care, platelet transfusion if platelet less than 10, blood transfusion if hemoglobin less than 7.5.  We can consider transfusion if EGD is considered. -I will continue to follow   Mikey Bussing  01/22/2021   Addendum  I have seen the patient, examined her. I agree with the assessment and and plan and have edited the notes.   Appreciate GI follow up, she is scheduled for EGD tomorrow, will give plt transfusion tomorrow if needed. Pt is tolerating liquid diet, and some selected soft food. I discussed home care with PT, she is interested. Will f/u after EGD.  [name]  01/22/2021

## 2021-01-22 NOTE — Care Management Important Message (Signed)
Important Message  Patient Details IM Letter given to the Patient. Name: Tara Rodgers MRN: [id number] Date of Birth: 1946-06-01   Medicare Important Message Given:  Yes     Kerin Salen 01/22/2021, 10:50 AM

## 2021-01-23 ENCOUNTER — Inpatient Hospital Stay (HOSPITAL_COMMUNITY): Payer: Medicare Other | Admitting: Certified Registered Nurse Anesthetist

## 2021-01-23 ENCOUNTER — Encounter (HOSPITAL_COMMUNITY): Admission: EM | Disposition: E | Payer: Self-pay | Source: Home / Self Care | Attending: Internal Medicine

## 2021-01-23 ENCOUNTER — Encounter (HOSPITAL_COMMUNITY): Payer: Self-pay | Admitting: Family Medicine

## 2021-01-23 DIAGNOSIS — R1310 Dysphagia, unspecified: Secondary | ICD-10-CM | POA: Diagnosis not present

## 2021-01-23 DIAGNOSIS — T451X5A Adverse effect of antineoplastic and immunosuppressive drugs, initial encounter: Secondary | ICD-10-CM | POA: Diagnosis not present

## 2021-01-23 DIAGNOSIS — D701 Agranulocytosis secondary to cancer chemotherapy: Secondary | ICD-10-CM | POA: Diagnosis not present

## 2021-01-23 LAB — CBC WITH DIFFERENTIAL/PLATELET
Abs Immature Granulocytes: 0.28 10*3/uL — ABNORMAL HIGH (ref 0.00–0.07)
Abs Immature Granulocytes: 0.32 10*3/uL — ABNORMAL HIGH (ref 0.00–0.07)
Basophils Absolute: 0 10*3/uL (ref 0.0–0.1)
Basophils Absolute: 0 10*3/uL (ref 0.0–0.1)
Basophils Relative: 1 %
Basophils Relative: 1 %
Eosinophils Absolute: 0 10*3/uL (ref 0.0–0.5)
Eosinophils Absolute: 0 10*3/uL (ref 0.0–0.5)
Eosinophils Relative: 0 %
Eosinophils Relative: 0 %
HCT: 26.5 % — ABNORMAL LOW (ref 36.0–46.0)
HCT: 26 % — ABNORMAL LOW (ref 36.0–46.0)
Hemoglobin: 8.8 g/dL — ABNORMAL LOW (ref 12.0–15.0)
Hemoglobin: 8.7 g/dL — ABNORMAL LOW (ref 12.0–15.0)
Immature Granulocytes: 13 %
Immature Granulocytes: 14 %
Lymphocytes Relative: 9 %
Lymphocytes Relative: 9 %
Lymphs Abs: 0.2 10*3/uL — ABNORMAL LOW (ref 0.7–4.0)
Lymphs Abs: 0.2 10*3/uL — ABNORMAL LOW (ref 0.7–4.0)
MCH: 27.4 pg (ref 26.0–34.0)
MCH: 27.4 pg (ref 26.0–34.0)
MCHC: 33.2 g/dL (ref 30.0–36.0)
MCHC: 33.5 g/dL (ref 30.0–36.0)
MCV: 82.6 fL (ref 80.0–100.0)
MCV: 81.8 fL (ref 80.0–100.0)
Monocytes Absolute: 0.6 10*3/uL (ref 0.1–1.0)
Monocytes Absolute: 0.6 10*3/uL (ref 0.1–1.0)
Monocytes Relative: 29 %
Monocytes Relative: 26 %
Neutro Abs: 1 10*3/uL — ABNORMAL LOW (ref 1.7–7.7)
Neutro Abs: 1.1 10*3/uL — ABNORMAL LOW (ref 1.7–7.7)
Neutrophils Relative %: 48 %
Neutrophils Relative %: 50 %
Platelets: 21 10*3/uL — CL (ref 150–400)
Platelets: 81 10*3/uL — ABNORMAL LOW (ref 150–400)
RBC: 3.21 MIL/uL — ABNORMAL LOW (ref 3.87–5.11)
RBC: 3.18 MIL/uL — ABNORMAL LOW (ref 3.87–5.11)
RDW: 15.6 % — ABNORMAL HIGH (ref 11.5–15.5)
RDW: 15.5 % (ref 11.5–15.5)
WBC: 2.1 10*3/uL — ABNORMAL LOW (ref 4.0–10.5)
WBC: 2.3 10*3/uL — ABNORMAL LOW (ref 4.0–10.5)
nRBC: 0 % (ref 0.0–0.2)
nRBC: 0 % (ref 0.0–0.2)

## 2021-01-23 LAB — BASIC METABOLIC PANEL
Anion gap: 12 (ref 5–15)
Anion gap: 13 (ref 5–15)
Anion gap: 11 (ref 5–15)
BUN: 9 mg/dL (ref 8–23)
BUN: 9 mg/dL (ref 8–23)
BUN: 8 mg/dL (ref 8–23)
CO2: 17 mmol/L — ABNORMAL LOW (ref 22–32)
CO2: 17 mmol/L — ABNORMAL LOW (ref 22–32)
CO2: 19 mmol/L — ABNORMAL LOW (ref 22–32)
Calcium: 8.2 mg/dL — ABNORMAL LOW (ref 8.9–10.3)
Calcium: 8 mg/dL — ABNORMAL LOW (ref 8.9–10.3)
Calcium: 8.2 mg/dL — ABNORMAL LOW (ref 8.9–10.3)
Chloride: 108 mmol/L (ref 98–111)
Chloride: 106 mmol/L (ref 98–111)
Chloride: 108 mmol/L (ref 98–111)
Creatinine, Ser: 0.59 mg/dL (ref 0.44–1.00)
Creatinine, Ser: 0.56 mg/dL (ref 0.44–1.00)
Creatinine, Ser: 0.55 mg/dL (ref 0.44–1.00)
GFR, Estimated: >60 mL/min (ref >60)
GFR, Estimated: >60 mL/min (ref >60)
GFR, Estimated: >60 mL/min (ref >60)
Glucose, Bld: 112 mg/dL — ABNORMAL HIGH (ref 70–99)
Glucose, Bld: 98 mg/dL (ref 70–99)
Glucose, Bld: 117 mg/dL — ABNORMAL HIGH (ref 70–99)
Potassium: 2.7 mmol/L — CL (ref 3.5–5.1)
Potassium: 2.5 mmol/L — CL (ref 3.5–5.1)
Potassium: 2.7 mmol/L — CL (ref 3.5–5.1)
Sodium: 137 mmol/L (ref 135–145)
Sodium: 136 mmol/L (ref 135–145)
Sodium: 138 mmol/L (ref 135–145)

## 2021-01-23 LAB — POCT I-STAT, CHEM 8
BUN: 6 mg/dL — ABNORMAL LOW (ref 8–23)
Calcium, Ion: 1.07 mmol/L — ABNORMAL LOW (ref 1.15–1.40)
Chloride: 105 mmol/L (ref 98–111)
Creatinine, Ser: 0.4 mg/dL — ABNORMAL LOW (ref 0.44–1.00)
Glucose, Bld: 99 mg/dL (ref 70–99)
HCT: 25 % — ABNORMAL LOW (ref 36.0–46.0)
Hemoglobin: 8.5 g/dL — ABNORMAL LOW (ref 12.0–15.0)
Potassium: 2.6 mmol/L — CL (ref 3.5–5.1)
Sodium: 138 mmol/L (ref 135–145)
TCO2: 18 mmol/L — ABNORMAL LOW (ref 22–32)

## 2021-01-23 LAB — MAGNESIUM: Magnesium: 1.5 mg/dL — ABNORMAL LOW (ref 1.7–2.4)

## 2021-01-23 LAB — PROTIME-INR
INR: 1.7 — ABNORMAL HIGH (ref 0.8–1.2)
Prothrombin Time: 19 seconds — ABNORMAL HIGH (ref 11.4–15.2)

## 2021-01-23 SURGERY — CANCELLED PROCEDURE

## 2021-01-23 MED ORDER — SODIUM CHLORIDE 0.9% IV SOLUTION: Freq: Once | INTRAVENOUS | Status: AC

## 2021-01-23 MED ORDER — PROPOFOL 10 MG/ML IV BOLUS
INTRAVENOUS | Status: AC
  Filled 2021-01-23: qty 20

## 2021-01-23 MED ORDER — POTASSIUM CHLORIDE 10 MEQ/50ML IV SOLN
10.0000 meq | INTRAVENOUS | Status: AC
  Administered 2021-01-23 (×4): 10 meq via INTRAVENOUS
  Filled 2021-01-23 (×4): qty 50

## 2021-01-23 MED ORDER — METOPROLOL TARTRATE 5 MG/5ML IV SOLN
2.5000 mg | INTRAVENOUS | Status: AC | PRN
  Administered 2021-01-23 (×2): 2.5 mg via INTRAVENOUS
  Filled 2021-01-23 (×3): qty 5

## 2021-01-23 MED ORDER — POTASSIUM CHLORIDE 10 MEQ/50ML IV SOLN
10.0000 meq | INTRAVENOUS | Status: AC
  Administered 2021-01-23 (×6): 10 meq via INTRAVENOUS
  Filled 2021-01-23 (×6): qty 50

## 2021-01-23 MED ORDER — SODIUM CHLORIDE 0.9 % IV SOLN: INTRAVENOUS | Status: DC

## 2021-01-23 MED ORDER — MAGNESIUM SULFATE 2 GM/50ML IV SOLN
2.0000 g | Freq: Once | INTRAVENOUS | Status: AC
  Administered 2021-01-23: 2 g via INTRAVENOUS
  Filled 2021-01-23: qty 50

## 2021-01-23 NOTE — Progress Notes (Signed)
PROGRESS NOTE  DESERI LOSS  DOB: 01/11/1946  PCP: Elby Showers, MD BJS:[id number]  DOA: 01/22/2021  LOS: 8 days   Chief Complaint  Patient presents with  . Emesis  . Nausea  . Dysphagia    Brief narrative: CAMARIE MCTIGUE is a 75 y.o. female with PMH significant for metastatic SCLC undergoing chemotherapy s/p XRT, transfusion-dependent anemia, thrombocytopenia, right breast CA s/p mastectomy 2016 on antiestrogen therapy. Patient presented to the ED on 01/15/2021 with complaint of 3 weeks of constant, worsening, severe dysphagia, globus sensation, and odynophagia that has limited per oral intake severely to the point that she was not able to tolerate any po and not able to take her medications.  In the ED, she appeared dehydrated, tachycardic to 130s, afebrile, normotensive, benign abdominal exam Labs with ketones in urine, potassium low at 2.9, metabolic acidosis, LFTs elevated.   Patient was admitted to hospitalist service See below for details  Subjective: Patient was seen and examined this morning. Sitting up in bed.  Not in distress.  Continues to have dysphagia.  Remains n.p.o. this morning for planned EGD. Blood work this morning with potassium low at 2.7.  Patient getting IV replacement.  Assessment/Plan: Dysphagia, odynophagia -Suspected etiology: Radiation esophagitis versus fungal esophagitis -Continues to have dysphagia despite empiric treatment with nystatin, diflucan IV, Magic mouthwash and Carafate. -GI plans for an EGD this afternoon.  Pancytopenia -CBC being monitored.  Labs as below.  Seem to be gradually improving. -No active bleeding.  Platelet count 21,000 today.  As patient is planned for EGD, she is getting 1 unit of platelets this morning. -Oncology following.  Continue to monitor Recent Labs  Lab 01/19/21 0552 01/20/21 0520 01/21/21 0623 01/22/21 0618 01/19/2021 0534  WBC 0.4* 0.8* 1.5* 2.1* 2.1*  NEUTROABS 0.1* 0.4* 1.2* 1.1* 1.0*  HGB 10.4*  10.1* 9.1* 9.2* 8.8*  HCT 30.0* 29.9* 27.3* 27.7* 26.5*  MCV 80.0 82.1 82.0 82.0 82.6  PLT 23* 8* 22* 18* 21*   Moderate protein calorie malnutrition -Supplement protein as much as able  Hypokalemia/hypomagnesemia -Potassium level significantly low at 2.7 today.  IV replacement being given. Will recheck BMP at 12 prior to EGD.   -Magnesium level low at 1.5 today.  IV replacement to be given later. Recent Labs  Lab 01/18/21 0446 01/19/21 0552 01/20/21 0520 01/21/21 6073 01/22/21 0618 01/22/2021 0534 01/16/2021 0816  K 3.2* 3.1* 3.3* 3.3* 3.2* 2.7*  --   MG 1.3*  --   --  1.3* 1.9  --  1.5*  PHOS  --   --   --  3.3  --   --   --    Stage IV small cell lung cancer -Seen on PET 1/28 started on etoposide, carboplatin and atezolizumab 11/22/2020, s/p palliative mediastinal XRT 1/31 - 2/15.  -Oncology, Dr. [name] following  Solid 1.2 cm hypoechoic lesion in the posterosuperior aspect of the right liver: Nonspecific.  -Defer to oncology   LFT elevation -Multifactorial, improved. Oncology suspects metastatic disease and chemotherapy. RUQ U/S without ductal dilatation -Monitor.  Recent Labs  Lab 01/17/21 0446 01/19/21 0552  AST 41 20  ALT 215* 109*  ALKPHOS 92 93  BILITOT 2.3* 1.7*  PROT 5.4* 5.7*  ALBUMIN 3.2* 3.1*   HTN, tachycardia - Continues to have tachycardia probably related to dehydration.  IV metoprolol, hopefully could change back to po formulation of medications soon.  Hypothyroidism - Continue synthroid to IV for now. Has had good response following TSH.  History of  breast CA -On antiestrogen which we will hold while unable to take po.  History of covid-19 infection and s/p 2 doses of vaccine -Recommended 3rd dose.  Impaired mobility -PT eval obtained.  SNF recommended.  Mobility: PT eval obtained Code Status:   Code Status: DNR  Nutritional status: Body mass index is 22.71 kg/m. Nutrition Problem: Moderate Malnutrition Etiology: chronic  illness,cancer and cancer related treatments Signs/Symptoms: energy intake < or equal to 75% for > or equal to 1 month,percent weight loss,mild fat depletion,mild muscle depletion Diet Order            Diet NPO time specified  Diet effective midnight                 DVT prophylaxis: SCDs Start: 01/30/2021 1939   Antimicrobials:  fluconazole Fluid: NS with potassium at 100 mill per hour Consultants: GI, oncology Family Communication:  None at bedside  Status is: Inpatient  Remains inpatient appropriate because: Continues to have dysphagia, pancytopenia.  Plan for EGD today  Dispo: The patient is from: Home              Anticipated d/c is to: Home with home health PT              Patient currently is not medically stable to d/c.   Difficult to place patient No   Infusions:  . 0.9 % NaCl with KCl 20 mEq / L 100 mL/hr at 01/21/21 2209  . fluconazole (DIFLUCAN) IV 200 mg (01/22/21 0942)  . magnesium sulfate bolus IVPB    . potassium chloride 10 mEq (01/12/2021 0932)    Scheduled Meds: . Chlorhexidine Gluconate Cloth  6 each Topical Daily  . feeding supplement  237 mL Oral TID BM  . fentaNYL  1 patch Transdermal Q72H  . levothyroxine  50 mcg Intravenous Daily  . mouth rinse  15 mL Mouth Rinse BID  . metoprolol tartrate  5 mg Intravenous Q6H  . nystatin  5 mL Oral QID  . pantoprazole (PROTONIX) IV  40 mg Intravenous Q24H  . sodium chloride flush  10-40 mL Intracatheter Q12H  . sodium chloride flush  3 mL Intravenous Q12H  . sucralfate  1 g Oral TID WC & HS    Antimicrobials: Anti-infectives (From admission, onward)   Start     Dose/Rate Route Frequency Ordered Stop   01/16/21 1000  fluconazole (DIFLUCAN) IVPB 200 mg        200 mg 100 mL/hr over 60 Minutes Intravenous Daily 01/15/21 1024     01/15/21 1300  cefTRIAXone (ROCEPHIN) 1 g in sodium chloride 0.9 % 100 mL IVPB  Status:  Discontinued        1 g 200 mL/hr over 30 Minutes Intravenous Every 24 hours 01/15/21 1210  01/21/21 1210   01/15/21 1100  fluconazole (DIFLUCAN) IVPB 400 mg        400 mg 100 mL/hr over 120 Minutes Intravenous  Once 01/15/21 1024 01/15/21 1326      PRN meds: acetaminophen **OR** [DISCONTINUED] acetaminophen, LORazepam, magic mouthwash w/lidocaine, ondansetron (ZOFRAN) IV, sodium chloride flush   Objective: Vitals:   01/21/2021 1040 02/10/2021 1058  BP: (!) 151/99 (!) 156/93  Pulse: (!) 113 (!) 111  Resp: 16 16  Temp: 98.2 F (36.8 C) 98 F (36.7 C)  SpO2: 100% 100%    Intake/Output Summary (Last 24 hours) at 01/18/2021 1130 Last data filed at 01/27/2021 0500 Gross per 24 hour  Intake 259 ml  Output 1700 ml  Net -1441 ml   Filed Weights   01/16/21 0816  Weight: 51 kg   Weight change:  Body mass index is 22.71 kg/m.   Physical Exam: General exam: Pleasant elderly Caucasian female.  Persistent dysphagia Skin: No rashes, lesions or ulcers. HEENT: Atraumatic, normocephalic, no obvious bleeding.  No oral thrush on exam.  No bleeding Lungs: Clear to auscultation bilaterally CVS: Regular rate and rhythm GI/Abd soft, nontender, nondistended, bowel sound present CNS: Alert, awake, oriented x3 Psychiatry: Depressed, frustrated. Extremities: No pedal edema, no calf tenderness  Data Review: I have personally reviewed the laboratory data and studies available.  Recent Labs  Lab 01/19/21 0552 01/20/21 0520 01/21/21 0623 01/22/21 0618 02/01/2021 0534  WBC 0.4* 0.8* 1.5* 2.1* 2.1*  NEUTROABS 0.1* 0.4* 1.2* 1.1* 1.0*  HGB 10.4* 10.1* 9.1* 9.2* 8.8*  HCT 30.0* 29.9* 27.3* 27.7* 26.5*  MCV 80.0 82.1 82.0 82.0 82.6  PLT 23* 8* 22* 18* 21*   Recent Labs  Lab 01/18/21 0446 01/19/21 0552 01/20/21 0520 01/21/21 0623 01/22/21 0618 01/15/2021 0534 02/02/2021 0816  NA 128* 132* 134* 134* 134* 137  --   K 3.2* 3.1* 3.3* 3.3* 3.2* 2.7*  --   CL 97* 98 100 103 107 108  --   CO2 18* 20* 21* 19* 18* 17*  --   GLUCOSE 88 94 95 96 104* 112*  --   BUN 8 7* 8 8 8 9   --    CREATININE 0.56 0.51 0.56 0.47 0.51 0.59  --   CALCIUM 8.0* 8.1* 8.3* 7.9* 7.8* 8.2*  --   MG 1.3*  --   --  1.3* 1.9  --  1.5*  PHOS  --   --   --  3.3  --   --   --     F/u labs ordered Unresulted Labs (From admission, onward)          Start     Ordered   02/09/2021 0500  CBC with Differential/Platelet  Daily,   R     Question:  Specimen collection method  Answer:  Unit=Unit collect   01/17/2021 0802   01/29/2021 5093  Basic metabolic panel  Daily,   R     Question:  Specimen collection method  Answer:  Unit=Unit collect   01/22/2021 0802   01/28/2021 0500  Magnesium  Tomorrow morning,   STAT       Question:  Specimen collection method  Answer:  Unit=Unit collect   01/31/2021 0950   01/28/2021 2671  Basic metabolic panel  Once-Timed,   TIMED       Question:  Specimen collection method  Answer:  Unit=Unit collect   01/16/2021 0802   01/22/2021 0805  ANCA Titers  (Anti-Neutrophilic Cystoplasmic Antibody Panel (PNL))  Once,   R       Question:  Specimen collection method  Answer:  Unit=Unit collect   02/06/2021 0804   01/13/2021 0805  Mpo/pr-3 (anca) antibodies  (Anti-Neutrophilic Cystoplasmic Antibody Panel (PNL))  Add-on,   AD       Question:  Specimen collection method  Answer:  Unit=Unit collect   01/13/2021 0804   01/25/2021 0804  C3 complement  Add-on,   AD       Question:  Specimen collection method  Answer:  Unit=Unit collect   02/10/2021 0804   02/07/2021 0804  C4 complement  Add-on,   AD       Question:  Specimen collection method  Answer:  Unit=Unit collect   01/28/2021  3810   01/19/2021 0804  Glomerular basement membrane antibodies  Add-on,   AD       Question:  Specimen collection method  Answer:  Unit=Unit collect   01/22/2021 0804          Signed, Terrilee Croak, MD [facility] 01/21/2021

## 2021-01-23 NOTE — Progress Notes (Signed)
OT Cancellation Note  Patient Details Name: Tara Rodgers MRN: [id number] DOB: 09/05/1946   Cancelled Treatment:    Reason Eval/Treat Not Completed: Patient declined, no reason specified:  Pt declined due to expecting a procedure later today and frustrated with NPO status. RN notified and pt was allowed mouth swabs lightly moistened to wet pt's mouth as pt complaining of dry mouth. Brought swabs to pt who continued to refuse OT. Will continue efforts at later date.   Julien Girt 01/22/2021, 11:37 AM

## 2021-01-23 NOTE — Anesthesia Preprocedure Evaluation (Deleted)
Anesthesia Evaluation    Reviewed: Allergy & Precautions, Patient's Chart, lab work & pertinent test results  Airway        Dental   Pulmonary asthma ,           Cardiovascular hypertension, Pt. on home beta blockers   ECG: ST, rate 123   Neuro/Psych Anxiety negative neurological ROS     GI/Hepatic Neg liver ROS, hiatal hernia, GERD  ,  Endo/Other  Hypothyroidism hypokalemia  Renal/GU negative Renal ROS     Musculoskeletal  (+) Arthritis , Gout   Abdominal   Peds  Hematology  (+) Blood dyscrasia, anemia , thrombocytopenia   Anesthesia Other Findings dysphagia and odynophagia  Reproductive/Obstetrics                             Anesthesia Physical Anesthesia Plan Anesthesia Quick Evaluation

## 2021-01-23 NOTE — Progress Notes (Signed)
Patient ID: Tara Rodgers, female   DOB: 12-Apr-1946, 75 y.o.   MRN: [id number]  Low potassium level despite runs of potassium so procedure cancelled since not emergent by anesthesia. Continue supportive care. Will repeat EGD if symptoms persist if potassium level rises above 3. Will f/u.

## 2021-01-23 NOTE — Progress Notes (Addendum)
Date and time results received: 01/29/2021 0630  Critical Value: Potassium 2.7  Name of Provider Notified: Dr. Sabino Gasser  Orders Received? Or Actions Taken?: New orders rec'd for potassium see MAR

## 2021-01-23 NOTE — Interval H&P Note (Signed)
History and Physical Interval Note:  02/04/2021 1:30 PM  Tara Rodgers  has presented today for surgery, with the diagnosis of dysphagia and odynophagia.  The various methods of treatment have been discussed with the patient and family. After consideration of risks, benefits and other options for treatment, the patient has consented to  Procedure(s): ESOPHAGOGASTRODUODENOSCOPY (EGD) (N/A) as a surgical intervention.  The patient's history has been reviewed, patient examined, no change in status, stable for surgery.  I have reviewed the patient's chart and labs.  Questions were answered to the patient's satisfaction.     Lear Ng

## 2021-01-23 NOTE — Progress Notes (Addendum)
HEMATOLOGY-ONCOLOGY PROGRESS NOTE  SUBJECTIVE: The patient continues to have dysphagia and odynophagia.  Currently n.p.o. for EGD later today.  Denies bleeding.   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 [facility]) Staging form: Breast, AJCC 7th Edition - Clinical: Stage IIA (T2, N0, M0) - Signed by [name], MD on 02/08/2015 Laterality: Right Estrogen receptor status: Positive Progesterone receptor status: Positive HER2 status: Negative - Pathologic stage from 03/23/2015: Stage Unknown (T2, NX, cM0) - Unsigned  Small cell lung cancer ([place]) Staging form: Lung, AJCC 8th Edition - Clinical stage from 11/13/2020: Stage IV (cTX, cN3, cM1) - Signed by [name], MD on 11/16/2020 Stage prefix: Initial diagnosis     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) and adjuvant radiation ([name]).   12/13/2014 Mammogram   Right breast: possible asymmetry warranting further evaluation with spot compression views and possibly ultrasound   12/16/2014 Breast US   Right breast: no definitive abnormality within the superior aspect of the right breast to correspond with the mammographic finding.   01/10/2015 Breast MRI   Right breast: irregular rim enhancing mass within the superior right breast at 11:30 measuring 2.5 x 2.4 x 2.4 cm. No abnormal enhancement is identified within the skin of the superior right breast in the area of concern on mammogram.   01/12/2015 Breast US   Second look diagnostic mammogram and ultrasound showed a 2.3 cm mass in the upper midline right breast. No axillary adenopathy.   01/12/2015 Initial Biopsy   Right breast needle core bx (upper midline): Invasive ductal carcinoma, grade 2,  ER+ (100%), PR+ (77%), HER2/neu negative (ratio 1.15), Ki67 20%    01/30/2015 Procedure   Breast High/Moderate Risk panel ([facility]) reveals no clinically significant variant at ATM, BRCA1, BRCA2, CDH1, CHEK2, PALB2, PTEN, STK11, and TP53.   02/08/2015 Clinical Stage   Stage IIA (T2 N0)   03/23/2015 Definitive Surgery   Right mastectomy ([name]): invasive adenocarcinoma, grade 3, 2.9 cm, HER2/neu repeated, negative (ratio 1.23)   03/23/2015 Oncotype testing   Score: 8 (6% ROR). No chemotherapy [name]).   03/23/2015 Pathologic Stage   Stage IIA: pT2 pNx   05/04/2015 -  Anti-estrogen oral therapy   Letrozole 2.79m daily. Planned duration of therapy at least 5 years.    07/25/2015 Survivorship   Survivorship visit completed and copy of care plan provided to patient.   12/15/2015 Mammogram   IMPRESSION: No mammographic evidence of malignancy. A result letter of this screening mammogram will be mailed directly to the patient.   12/23/2016 Mammogram   IMPRESSION: No mammographic evidence of malignancy. A result letter of this screening mammogram will be mailed directly to the patient.    12/30/2017 Mammogram   12/30/2017 Mammogram IMPRESSION: No mammographic evidence of malignancy. A result letter of this screening mammogram will be mailed directly to the patient.   03/27/2020 Mammogram   IMPRESSION: No mammographic evidence of malignancy. A result letter of this screening mammogram will be mailed directly to the patient.   10/26/2020 Imaging   CT chest IMPRESSION: 1. Bulky mediastinal, thoracic inlet, right supraclavicular, and left axillary lymphadenopathy consistent with metastatic disease. Associated numerous bilateral pulmonary nodules and masses, most prominently in the right middle lobe with dominant 4.1 cm peripheral mass lesion. 2. 16  mm rim enhancing lesion posterior right liver consistent with metastatic involvement. 3. 13 mm hypoenhancing lesion in the tail of pancreas.  Primary neoplasm or metastatic involvement could have this appearance. 4. Multiple nodules in the upper abdomen concerning for peritoneal/mesenteric metastatic disease. 5. Small right and tiny left pleural effusions with bibasilar atelectasis. 6. Aortic Atherosclerosis (ICD10-I70.0).   11/10/2020 PET scan   IMPRESSION: 1. Extensive hypermetabolic multi organ metastatic disease. 2. Bulky intensely hypermetabolic RIGHT supraclavicular, mediastinal and LEFT axillary adenopathy. 3. Hypermetabolic pulmonary mass in the RIGHT upper lobe with additional hypermetabolic nodules. 4. Hypermetabolic solitary hepatic metastasis. 5. Hypermetabolic lesion within the pancreas is also favored metastatic lesion. 6. Multifocal hypermetabolic skeletal metastasis. 7. Bilateral pleural effusions.   11/13/2020 Procedure   Thoracentesis  IMPRESSION: Successful ultrasound guided right thoracentesis yielding 200 mL of pleural fluid.   Small cell lung cancer ([place])  11/13/2020 Pathology Results   Right supraclavicular LN Biopsy  FINAL MICROSCOPIC DIAGNOSIS:   A. LYMPH NODE, RIGHT SUPRACLAVICULAR, NEEDLE CORE BIOPSY:  - Most consistent with small cell carcinoma.   COMMENT:   Tumor is limited in quantity, and partially necrotic.  TTF-1 and  Synaptophysin are positive.  Ki-67 proliferation index reaches 80-90%.  GATA-3, ER and p40 are negative.  The morphologic and immunophenotypic  characteristics are most suggestive of small cell carcinoma.  TTF-1  positive staining is compatible with origin from the lung; however, it  does not exclude origin from other entities.  Please note that distinct  nodal tissue is not identified in the submitted material.  Results  reported to Dr. [name] on 11/16/2020.  Dr. Melina Copa reviewed the case   11/13/2020 Cancer Staging   Staging form: Lung, AJCC 8th Edition - Clinical stage from 11/13/2020: Stage IV (cTX, cN3, cM1) - Signed by [name], MD on 11/16/2020 Stage prefix:  Initial diagnosis   11/13/2020 - 11/28/2020 Radiation Therapy   palliative radiation to [facility] with Dr Lisbeth Renshaw 11/13/20-11/28/20   11/16/2020 Initial Diagnosis   Small cell lung cancer ([place])   11/22/2020 -  Chemotherapy   First line etoposide D1-3, carboplatin and atezolizumab D1 q3weeks beginning 11/22/20.        11/29/2020 Procedure   PAC placed on 11/29/20   01/01/2021 Imaging   IMPRESSION: 1. Significant interval decrease in size of the right middle lobe lung mass and significant interval decrease in size of the mediastinal and right hilar lymph nodes. 2. Resolution of right supraclavicular and left axillary adenopathy. 3. Stable miliary pattern of pulmonary nodules. No new or progressive findings. 4. Stable diffuse sclerotic metastatic bone disease. No findings for progression. 5. Interval decrease in size of the pancreatic mass and adjacent lymph node. 6. Slightly smaller segment 7 liver lesion. No new or progressive findings. 7. Emphysema and aortic atherosclerosis.   Aortic Atherosclerosis (ICD10-I70.0) and Emphysema (ICD10-J43.9).        REVIEW OF SYSTEMS:   Constitutional: Denies fevers, chills Eyes: Denies blurriness of vision Ears, nose, mouth, throat, and face: Denies mucositis, has dysphagia and odynophagia. Respiratory: Denies cough, dyspnea or wheezes Cardiovascular: Denies palpitation, chest discomfort Gastrointestinal: Reports severe dysphagia Skin: Denies abnormal skin rashes Lymphatics: Denies new lymphadenopathy or easy bruising Neurological:Denies numbness, tingling or new weaknesses Behavioral/Psych: Mood is stable, no new changes  Extremities: No lower extremity edema All other systems were reviewed with the patient and are negative.  I have reviewed the past medical history, past surgical history, social history and family history with the patient and they are unchanged from previous note.  PHYSICAL EXAMINATION: ECOG PERFORMANCE STATUS: 3 -  Symptomatic, >50% confined to bed  Vitals:   01/22/21 1317 01/22/21 2052  BP: (!) 161/88 (!) 165/86  Pulse: (!) 101 (!) 110  Resp: 15 18  Temp: 97.6 F (36.4 C) 98.1 F (36.7 C)  SpO2: 100% 100%   Filed Weights   01/16/21 0816  Weight: 51 kg    Intake/Output from previous day: 04/11 0701 - 04/12 0700 In: 409 [P.O.:409] Out: 2500 [Urine:2500]  GENERAL: Chronically ill-appearing female, no distress SKIN: skin color, texture, turgor are normal, no rashes or significant lesions, no petechia or ecchymosis EYES: normal, Conjunctiva are pink and non-injected, sclera clear ABDOMEN:abdomen soft, non-tender and normal bowel sounds NEURO: alert & oriented x 3 with fluent speech, no focal motor/sensory deficits  LABORATORY DATA:  I have reviewed the data as listed CMP Latest Ref Rng & Units 01/17/2021 01/22/2021 01/21/2021  Glucose 70 - 99 mg/dL 112(H) 104(H) 96  BUN 8 - 23 mg/dL _0 Creatinine 0.44 - 1.00 mg/dL 0.59 0.51 0.47  Sodium 135 - 145 mmol/L 137 134(L) 134(L)  Potassium 3.5 - 5.1 mmol/L 2.7(LL) 3.2(L) 3.3(L)  Chloride 98 - 111 mmol/L 108 107 103  CO2 22 - 32 mmol/L 17(L) 18(L) 19(L)  Calcium 8.9 - 10.3 mg/dL 8.2(L) 7.8(L) 7.9(L)  Total Protein 6.5 - 8.1 g/dL - - -  Total Bilirubin 0.3 - 1.2 mg/dL - - -  Alkaline Phos 38 - 126 U/L - - -  AST 15 - 41 U/L - - -  ALT 0 - 44 U/L - - -    Lab Results  Component Value Date   WBC 2.1 (L) 02/05/2021   HGB 8.8 (L) 01/13/2021   HCT 26.5 (L) 01/18/2021   MCV 82.6 01/31/2021   PLT 21 (LL) 01/30/2021   NEUTROABS 1.0 (L) 01/12/2021    DG Chest 2 View  Result Date: 01/26/2021 CLINICAL DATA:  Dysphagia and chest pain with nausea and gagging as well as vomiting 3 weeks. History of metastatic small cell lung cancer with last chemotherapy 2 weeks ago. EXAM: CHEST - 2 VIEW COMPARISON:  11/13/2020 and CT 01/01/2021 FINDINGS: Right IJ Port-A-Cath has tip over the SVC. Lungs are adequately inflated without focal airspace consolidation  or effusion. Cardiomediastinal silhouette is normal. Stable moderate sclerotic compression fractures over the thoracic and lumbar spine compared to 01/01/2021, although these are new compared to 10/26/2020. IMPRESSION: 1.  No acute cardiopulmonary disease. 2. Several moderate sclerotic compression fractures of the thoracic and lumbar spine new since January 2022 but unchanged from 01/01/2021 likely metastatic in origin. Electronically Signed   By: Marin Olp M.D.   On: 01/15/2021 14:58   CT CHEST ABDOMEN PELVIS W CONTRAST  Result Date: 01/02/2021 CLINICAL DATA:  Small cell lung cancer. History of chemotherapy and radiation therapy. EXAM: CT CHEST, ABDOMEN, AND PELVIS WITH CONTRAST TECHNIQUE: Multidetector CT imaging of the chest, abdomen and pelvis was performed following the standard protocol during bolus administration of intravenous contrast. CONTRAST:  188m OMNIPAQUE IOHEXOL 300 MG/ML  SOLN COMPARISON:  CT abdomen/pelvis 11/28/2020 and PET-CT 11/10/2020 FINDINGS: CT CHEST FINDINGS Cardiovascular: The heart is normal in size. No pericardial effusion. Stable tortuosity and calcification of the thoracic aorta and stable scattered coronary artery calcifications. The right IJ Port-A-Cath is in good position without complicating features. Mediastinum/Nodes: Much improved mediastinal and right hilar adenopathy. Anterior mediastinal nodal mass measures 20 x 12 mm on image 23/2. This previously measured 4.1 x 3.5 cm. Subcarinal node measures  9.5 mm on image 27/2. This previously measured 20 mm. On the prior PET-CT there is a large right supraclavicular nodal mass. I do not see any residual measurable lymphadenopathy. The hypermetabolic left axillary adenopathy seen on the prior PET-CT has resolved. The esophagus is grossly normal. Lungs/Pleura: The peripheral right middle lobe lung mass is much smaller. It measures approximately 4.2 x 2.0 cm on the prior CT scan and now measures approximately 2.3 x 1.0 cm.  Persistent miliary pattern of pulmonary nodules throughout both lungs. No new lung lesions. No pleural effusions. Musculoskeletal: Stable appearing diffuse sclerotic T7 and T10 pathologic compression fractures are again noted. Do not see any obvious new or progressive metastatic bone disease. Metastatic disease CT ABDOMEN PELVIS FINDINGS Hepatobiliary: 1.7 x 1.4 cm segment 7 liver lesion previously measured approximately 2.3 x 1.9 cm. No new hepatic metastatic lesions. The gallbladder is unremarkable.  No common bile duct dilatation. Pancreas: The 17 mm pancreatic mass in the body tail junction region now measures approximately 7 mm. There was an adjacent lymph node which measured measured 9 mm and this is no longer identified. Spleen: Normal size.  No focal lesions. Adrenals/Urinary Tract: The adrenal glands and kidneys are unremarkable. No evidence of adrenal gland metastatic disease. The bladder is unremarkable. Stomach/Bowel: The stomach, duodenum, small bowel and colon are unremarkable. Vascular/Lymphatic: Stable atherosclerotic calcifications involving the aorta and branch vessels. No aneurysm or dissection. The major venous structures are patent. No mesenteric or retroperitoneal mass or lymphadenopathy. Reproductive: The uterus and ovaries are unremarkable. Other: No pelvic mass or free pelvic fluid collections. No inguinal mass or adenopathy. Musculoskeletal: Stable severe degenerative changes involving the left hip. Stable scattered sclerotic metastatic bone disease without findings for progression. IMPRESSION: 1. Significant interval decrease in size of the right middle lobe lung mass and significant interval decrease in size of the mediastinal and right hilar lymph nodes. 2. Resolution of right supraclavicular and left axillary adenopathy. 3. Stable miliary pattern of pulmonary nodules. No new or progressive findings. 4. Stable diffuse sclerotic metastatic bone disease. No findings for progression. 5.  Interval decrease in size of the pancreatic mass and adjacent lymph node. 6. Slightly smaller segment 7 liver lesion. No new or progressive findings. 7. Emphysema and aortic atherosclerosis. Aortic Atherosclerosis (ICD10-I70.0) and Emphysema (ICD10-J43.9). Electronically Signed   By: Marijo Sanes M.D.   On: 01/02/2021 12:28   US Abdomen Limited RUQ (LIVER/GB)  Result Date: 01/15/2021 CLINICAL DATA:  Elevated LFTs EXAM: ULTRASOUND ABDOMEN LIMITED RIGHT UPPER QUADRANT COMPARISON:  None. FINDINGS: Gallbladder: No gallstones or wall thickening visualized. No sonographic Murphy sign noted by sonographer. Common bile duct: Diameter: Normal at 1.5 mm Liver: Small subtly hypoechoic solid lesion in the posterosuperior aspect of the right liver measures 1.2 x 1.1 cm. Hepatic parenchymal echogenicity and echotexture within normal limits. Portal vein is patent on color Doppler imaging with normal direction of blood flow towards the liver. Other: None. IMPRESSION: 1. Small nonspecific solid 1.2 cm hypoechoic lesion in the posterosuperior aspect of the right liver. Differential considerations are broad and include both malignant and benign neoplastic processes. Recommend further evaluation with gadolinium-enhanced MRI of the abdomen. 2. Otherwise, unremarkable right upper quadrant ultrasound. Electronically Signed   By: Jacqulynn Cadet M.D.   On: 01/15/2021 06:37    ASSESSMENT AND PLAN: 1.  Extensive stage small cell lung cancer 2.  Pancytopenia secondary to chemotherapy 3.  Dysphagia, odynophagia, likely secondary to chemo, radiation and possible Candida esophagitis 4.  Transaminitis and hyperbilirubinemia, improved  5.  Failure of thrive 6.  Hypothyroidism 7.  History of breast cancer 8. DNR   -Neutropenia is stable, ANC 1.0 today.  Should be adequate for EGD.   -Platelet count 21,000 this morning.  Discussed with GI and they would like the patient to receive 1 unit of platelets today.  Order placed for  platelets with 1 hour posttransfusion platelet count. -Continue supportive care, platelet transfusion if platelet less than 10, blood transfusion if hemoglobin less than 7.5.   -I will continue to follow   Mikey Bussing  01/22/2021   Addendum  I have seen the patient, examined her. I agree with the assessment and and plan and have edited the notes.   Patient underwent EGD today, unfortunately showed a severe stenosis in the esophagus, and Dr. Michail Sermon was not able to pass the scope or perform dilatation.  Biopsy was done.  It does not appear to be malignant under endoscopy.  I am not sure if we have any resolution for this.  I do not recommend surgery given her underlying metastatic small cell lung cancer, poor nutrition, and overall poor prognosis.  I do not think she is a candidate for more chemotherapy. We will continue supportive care, I discussed home care service, palliative versus hospice with patient and her daughter.  Patient is not ready for hospice, but agree with palliative home care.   [name]  01/25/2021

## 2021-01-23 NOTE — H&P (View-Only) (Signed)
Patient ID: Tara Rodgers, female   DOB: 1946/09/18, 75 y.o.   MRN: [id number]  Low potassium level despite runs of potassium so procedure cancelled since not emergent by anesthesia. Continue supportive care. Will repeat EGD if symptoms persist if potassium level rises above 3. Will f/u.

## 2021-01-24 ENCOUNTER — Encounter (HOSPITAL_COMMUNITY): Admission: EM | Disposition: E | Payer: Self-pay | Source: Home / Self Care | Attending: Internal Medicine

## 2021-01-24 ENCOUNTER — Inpatient Hospital Stay (HOSPITAL_COMMUNITY): Payer: Medicare Other | Admitting: Anesthesiology

## 2021-01-24 ENCOUNTER — Encounter: Payer: Self-pay | Admitting: Hematology

## 2021-01-24 ENCOUNTER — Encounter (HOSPITAL_COMMUNITY): Payer: Self-pay | Admitting: Family Medicine

## 2021-01-24 DIAGNOSIS — C3490 Malignant neoplasm of unspecified part of unspecified bronchus or lung: Secondary | ICD-10-CM | POA: Diagnosis not present

## 2021-01-24 DIAGNOSIS — D701 Agranulocytosis secondary to cancer chemotherapy: Secondary | ICD-10-CM | POA: Diagnosis not present

## 2021-01-24 DIAGNOSIS — T451X5A Adverse effect of antineoplastic and immunosuppressive drugs, initial encounter: Secondary | ICD-10-CM | POA: Diagnosis not present

## 2021-01-24 HISTORY — PX: ESOPHAGOGASTRODUODENOSCOPY: SHX5428

## 2021-01-24 HISTORY — PX: ESOPHAGEAL BRUSHING: SHX6842

## 2021-01-24 LAB — CBC WITH DIFFERENTIAL/PLATELET
Abs Immature Granulocytes: 0.15 10*3/uL — ABNORMAL HIGH (ref 0.00–0.07)
Basophils Absolute: 0 10*3/uL (ref 0.0–0.1)
Basophils Relative: 1 %
Eosinophils Absolute: 0 10*3/uL (ref 0.0–0.5)
Eosinophils Relative: 0 %
HCT: 25.1 % — ABNORMAL LOW (ref 36.0–46.0)
Hemoglobin: 8.3 g/dL — ABNORMAL LOW (ref 12.0–15.0)
Immature Granulocytes: 10 %
Lymphocytes Relative: 11 %
Lymphs Abs: 0.2 10*3/uL — ABNORMAL LOW (ref 0.7–4.0)
MCH: 27.4 pg (ref 26.0–34.0)
MCHC: 33.1 g/dL (ref 30.0–36.0)
MCV: 82.8 fL (ref 80.0–100.0)
Monocytes Absolute: 0.5 10*3/uL (ref 0.1–1.0)
Monocytes Relative: 30 %
Neutro Abs: 0.7 10*3/uL — ABNORMAL LOW (ref 1.7–7.7)
Neutrophils Relative %: 48 %
Platelets: 65 10*3/uL — ABNORMAL LOW (ref 150–400)
RBC: 3.03 MIL/uL — ABNORMAL LOW (ref 3.87–5.11)
RDW: 16 % — ABNORMAL HIGH (ref 11.5–15.5)
WBC: 1.5 10*3/uL — ABNORMAL LOW (ref 4.0–10.5)
nRBC: 0 % (ref 0.0–0.2)

## 2021-01-24 LAB — BASIC METABOLIC PANEL
Anion gap: 8 (ref 5–15)
BUN: 8 mg/dL (ref 8–23)
CO2: 19 mmol/L — ABNORMAL LOW (ref 22–32)
Calcium: 7.9 mg/dL — ABNORMAL LOW (ref 8.9–10.3)
Chloride: 108 mmol/L (ref 98–111)
Creatinine, Ser: 0.52 mg/dL (ref 0.44–1.00)
GFR, Estimated: >60 mL/min (ref >60)
Glucose, Bld: 106 mg/dL — ABNORMAL HIGH (ref 70–99)
Potassium: 3.2 mmol/L — ABNORMAL LOW (ref 3.5–5.1)
Sodium: 135 mmol/L (ref 135–145)

## 2021-01-24 LAB — PREPARE PLATELET PHERESIS: Unit division: 0

## 2021-01-24 LAB — BPAM PLATELET PHERESIS
Blood Product Expiration Date: 202204142359
ISSUE DATE / TIME: 202204121044
Unit Type and Rh: 6200

## 2021-01-24 LAB — ANCA TITERS
Atypical P-ANCA titer: <1:20 {titer}
C-ANCA: <1:20 {titer}
P-ANCA: <1:20 {titer}

## 2021-01-24 LAB — GLOMERULAR BASEMENT MEMBRANE ANTIBODIES: GBM Ab: 4 units (ref 0–20)

## 2021-01-24 LAB — MPO/PR-3 (ANCA) ANTIBODIES
ANCA Proteinase 3: <3.5 U/mL (ref 0.0–3.5)
Myeloperoxidase Abs: <9 U/mL (ref 0.0–9.0)

## 2021-01-24 LAB — MAGNESIUM: Magnesium: 1.9 mg/dL (ref 1.7–2.4)

## 2021-01-24 SURGERY — EGD (ESOPHAGOGASTRODUODENOSCOPY)
Anesthesia: Monitor Anesthesia Care

## 2021-01-24 MED ORDER — FENTANYL CITRATE (PF) 250 MCG/5ML IJ SOLN: INTRAMUSCULAR | Status: DC | PRN

## 2021-01-24 MED ORDER — ONDANSETRON HCL 4 MG/2ML IJ SOLN
INTRAMUSCULAR | Status: DC | PRN
  Administered 2021-01-24: 4 mg via INTRAVENOUS

## 2021-01-24 MED ORDER — PROPOFOL 1000 MG/100ML IV EMUL
INTRAVENOUS | Status: AC
  Filled 2021-01-24: qty 100

## 2021-01-24 MED ORDER — PROPOFOL 500 MG/50ML IV EMUL
INTRAVENOUS | Status: DC | PRN
  Administered 2021-01-24: 125 ug/kg/min via INTRAVENOUS

## 2021-01-24 MED ORDER — PROPOFOL 10 MG/ML IV BOLUS
INTRAVENOUS | Status: DC | PRN
  Administered 2021-01-24: 20 mg via INTRAVENOUS

## 2021-01-24 MED ORDER — LACTATED RINGERS IV SOLN
INTRAVENOUS | Status: DC
  Administered 2021-01-24: 1000 mL via INTRAVENOUS

## 2021-01-24 MED ORDER — POTASSIUM CHLORIDE 10 MEQ/50ML IV SOLN
10.0000 meq | INTRAVENOUS | Status: AC
  Administered 2021-01-24 (×3): 10 meq via INTRAVENOUS
  Filled 2021-01-24 (×3): qty 50

## 2021-01-24 NOTE — Op Note (Signed)
[facility] Patient Name: Tara Rodgers Procedure Date: 02/02/2021 MRN: [id number] Attending MD: Lear Ng , MD Date of Birth: 03-31-1946 CSN: [id number] Age: 75 Admit Type: Inpatient Procedure:                Upper GI endoscopy Indications:              Dysphagia, Odynophagia Providers:                Lear Ng, MD, Benay Pillow, RN, Elspeth Cho Tech., Technician, Courtney Heys [place], CRNA Referring MD:             hospital team Medicines:                Propofol per Anesthesia, Monitored Anesthesia Care Complications:            No immediate complications. Estimated Blood Loss:     Estimated blood loss was minimal. Procedure:                Pre-Anesthesia Assessment:                           - Prior to the procedure, a History and Physical                            was performed, and patient medications and                            allergies were reviewed. The patient's tolerance of                            previous anesthesia was also reviewed. The risks                            and benefits of the procedure and the sedation                            options and risks were discussed with the patient.                            All questions were answered, and informed consent                            was obtained. Prior Anticoagulants: The patient has                            taken no previous anticoagulant or antiplatelet                            agents. ASA Grade Assessment: III - A patient with                            severe systemic disease. After reviewing the risks  and benefits, the patient was deemed in                            satisfactory condition to undergo the procedure.                           After obtaining informed consent, the endoscope was                            passed under direct vision. Throughout the                            procedure, the  patient's blood pressure, pulse, and                            oxygen saturations were monitored continuously. The                            [id number]-[id number] ([id number]) Olympus gastroscope was                            introduced through the mouth, and advanced to the                            middle third of esophagus. The [id number]-[id number] ([id number])                            Olympus ultra slim endoscope was introduced through                            the mouth, and advanced to the second part of                            duodenum. The upper GI endoscopy was performed with                            difficulty due to stricture. Successful completion                            of the procedure was aided by withdrawing the scope                            and replacing with the pediatric endoscope. The                            patient tolerated the procedure well. Scope In: Scope Out: Findings:      One benign-appearing, intrinsic severe (stenosis; an endoscope cannot       pass) stenosis was found 24 cm from the incisors. The stenosis was       traversed after downsizing scope. The lesion was not amenable to       dilation, and this was not attempted.      LA Grade C (one or more mucosal breaks continuous between tops of 2 or  more mucosal folds, less than 75% circumference) esophagitis with       bleeding was found in the distal esophagus. Cells for cytology were       obtained by brushing. Estimated blood loss was minimal.      The Z-line was found 35 cm from the incisors.      Segmental mild inflammation characterized by congestion (edema) and       erythema was found in the prepyloric region of the stomach.      The cardia and gastric fundus were normal on retroflexion.      The examined duodenum was normal. Impression:               - Benign-appearing esophageal stenosis. Lesion not                            amenable to dilation, and not attempted.                           - LA  Grade C radiation esophagitis with bleeding.                            Cells for cytology obtained.                           - Z-line, 35 cm from the incisors.                           - Acute gastritis.                           - Normal examined duodenum.                           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 Moderate Sedation:      Not Applicable - Patient had care per Anesthesia. Recommendation:           - Clear liquid diet.                           - Carafate slurry. F/U on cytologic brushing for                            Candida and Herpes.                           - Use Protonix (pantoprazole) 40 mg IV BID. Procedure Code(s):        --- Professional ---                           [phone], Esophagogastroduodenoscopy, flexible,                            transoral; diagnostic, including collection of  specimen(s) by brushing or washing, when performed                            (separate procedure) Diagnosis Code(s):        --- Professional ---                           R13.10, Dysphagia, unspecified                           K22.2, Esophageal obstruction                           T66.XXXA, Radiation sickness, unspecified, initial                            encounter                           K29.00, Acute gastritis without bleeding                           K20.81, Other esophagitis with bleeding CPT copyright 2019 [employer]. All rights reserved. The codes documented in this report are preliminary and upon coder review may  be revised to meet current compliance requirements. Lear Ng, MD 02/02/2021 1:17:19 PM This report has been signed electronically. Number of Addenda: 0

## 2021-01-24 NOTE — Progress Notes (Signed)
Per RN, patient has only consumed liquids today (a few ice chips and part of an ice pop).  Plan for EGD today at 12:30, as potassium is now above 3.  Platelets have improved and are now 65K/uL.Patient remains leukopenic (WBCs 1.5K/uL).

## 2021-01-24 NOTE — Transfer of Care (Signed)
Immediate Anesthesia Transfer of Care Note  Patient: Tara Rodgers  Procedure(s) Performed: ESOPHAGOGASTRODUODENOSCOPY (EGD) (N/A ) ESOPHAGEAL BRUSHING  Patient Location: PACU and Endoscopy Unit  Anesthesia Type:MAC  Level of Consciousness: awake, alert , oriented and patient cooperative  Airway & Oxygen Therapy: Patient Spontanous Breathing and Patient connected to face mask oxygen  Post-op Assessment: Report given to RN, Post -op Vital signs reviewed and stable and Patient moving all extremities  Post vital signs: Reviewed and stable  Last Vitals:  Vitals Value Taken Time  BP    Temp    Pulse    Resp    SpO2      Last Pain:  Vitals:   02/05/2021 1208  TempSrc: Temporal  PainSc: 0-No pain         Complications: No complications documented.

## 2021-01-24 NOTE — Progress Notes (Signed)
Physical Therapy Treatment Patient Details Name: Tara Rodgers MRN: [id number] DOB: [birth year] Today's Date: 01/22/2021    History of Present Illness Pt is 75 y.o. female admitted with 3 weeks of constant, worsening, severe dysphagia, globus sensation, and odynophagia that has limited per oral intake severely to the point that she isn't able to tolerate any po and not able to take her medications. Admitted on 02/10/2021 with Admitted with dysphagia, odynophagia, pancytopenia, and hypokalemia. PMH of metastatic SCLC undergoing chemotherapy s/p XRT, transfusion-dependent anemia, thrombocytopenia, right breast CA s/p mastectomy 2016.    PT Comments    Pt reports procedure today however agreeable to attempt to mobilize.  Upon performing bed mobility pt reports linen wet so assisted pt with transfer OOB, hygiene and then back to clean bed.     Follow Up Recommendations  Home health PT;Supervision for mobility/OOB     Equipment Recommendations  None recommended by PT    Recommendations for Other Services       Precautions / Restrictions Precautions Precautions: Fall Restrictions Weight Bearing Restrictions: No    Mobility  Bed Mobility Overal bed mobility: Needs Assistance Bed Mobility: Supine to Sit;Sit to Supine     Supine to sit: Supervision;HOB elevated Sit to supine: Supervision;HOB elevated   General bed mobility comments: HOB elevated and use of bed rail, increased time but no physical assist    Transfers Overall transfer level: Needs assistance Equipment used: Rolling walker (2 wheeled) Transfers: Sit to/from [facility] Sit to Stand: Min guard Stand pivot transfers: Min guard       General transfer comment: pt required cue for correct use of brakes for rollator, pt performed stand pivot to sit down on rollator, pt rested and then assisted with hygiene (bed pad and mesh undergarment covered with urine despite purewick) and then transferred back to  bed  Ambulation/Gait                 Stairs             Wheelchair Mobility    Modified Rankin (Stroke Patients Only)       Balance Overall balance assessment: Needs assistance Sitting-balance support: No upper extremity supported Sitting balance-Leahy Scale: Good     Standing balance support: Bilateral upper extremity supported Standing balance-Leahy Scale: Poor Standing balance comment: required UE support and min A to steady                            Cognition Arousal/Alertness: Awake/alert Behavior During Therapy: Flat affect Overall Cognitive Status: Within Functional Limits for tasks assessed                                        Exercises      General Comments        Pertinent Vitals/Pain Pain Assessment: No/denies pain    Home Living                      Prior Function            PT Goals (current goals can now be found in the care plan section) Progress towards PT goals: Progressing toward goals    Frequency    Min 3X/week      PT Plan Discharge plan needs to be updated;Current plan remains appropriate    Co-evaluation  AM-PAC PT "6 Clicks" Mobility   Outcome Measure  Help needed turning from your back to your side while in a flat bed without using bedrails?: A Little Help needed moving from lying on your back to sitting on the side of a flat bed without using bedrails?: A Little Help needed moving to and from a bed to a chair (including a wheelchair)?: A Little Help needed standing up from a chair using your arms (e.g., wheelchair or bedside chair)?: A Little Help needed to walk in hospital room?: A Lot Help needed climbing 3-5 steps with a railing? : Total 6 Click Score: 15    End of Session   Activity Tolerance: Patient limited by fatigue Patient left: with call bell/phone within reach;in bed;with bed alarm set Nurse Communication: Mobility status PT Visit  Diagnosis: Difficulty in walking, not elsewhere classified (R26.2);Muscle weakness (generalized) (M62.81)     Time: 8563-1497 PT Time Calculation (min) (ACUTE ONLY): 19 min  Charges:  $Therapeutic Activity: 8-22 mins                    Jannette Spanner PT, DPT Acute Rehabilitation Services Pager: [phone] Office: [phone]  York Ram E 01/19/2021, 12:02 PM

## 2021-01-24 NOTE — Anesthesia Postprocedure Evaluation (Signed)
Anesthesia Post Note  Patient: Tara Rodgers  Procedure(s) Performed: ESOPHAGOGASTRODUODENOSCOPY (EGD) (N/A ) ESOPHAGEAL BRUSHING     Patient location during evaluation: PACU Anesthesia Type: MAC Level of consciousness: awake and alert Pain management: pain level controlled Vital Signs Assessment: post-procedure vital signs reviewed and stable Respiratory status: spontaneous breathing, nonlabored ventilation, respiratory function stable and patient connected to nasal cannula oxygen Cardiovascular status: stable and blood pressure returned to baseline Postop Assessment: no apparent nausea or vomiting Anesthetic complications: no   No complications documented.  Last Vitals:  Vitals:   02/01/2021 1316 01/27/2021 1325  BP: (!) 141/97 (!) 169/91  Pulse: 100 (!) 101  Resp: 18 (!) 23  Temp:    SpO2: 100% 100%    Last Pain:  Vitals:   01/21/2021 1325  TempSrc:   PainSc: 0-No pain                 Mellanie Bejarano S

## 2021-01-24 NOTE — Interval H&P Note (Signed)
History and Physical Interval Note:  02/02/2021 12:34 PM  Tara Rodgers  has presented today for surgery, with the diagnosis of dysphagia and odynophagia.  The various methods of treatment have been discussed with the patient and family. After consideration of risks, benefits and other options for treatment, the patient has consented to  Procedure(s): ESOPHAGOGASTRODUODENOSCOPY (EGD) (N/A) as a surgical intervention.  The patient's history has been reviewed, patient examined, no change in status, stable for surgery.  I have reviewed the patient's chart and labs.  Questions were answered to the patient's satisfaction.     Lear Ng

## 2021-01-24 NOTE — Progress Notes (Signed)
Occupational Therapy Treatment  With encouragement patient willing to try and mobilize out of bed. Patient needed significant time and supervision for safety to sit upright out of bed. With first attempt standing patient had to sit back down quickly due to fatigue. Second attempt patient min A and needing help managing rollator to take side steps to head of bed. Patient sits quickly and states "I'm spent." Patient supervision to assist back to bed. Patient is still NPO, pending an EGD today and patient is hopeful she will be able to eat again soon. Due to weakness and limited activity tolerance patient may need short term rehab at D/C, however will continue to follow and hope patient will be able to have PO intake soon to aid in strengthening/endurance.   01/28/2021 1100  OT Visit Information  Last OT Received On 01/13/2021  Assistance Needed +1  History of Present Illness Pt is 75 y.o. female admitted with 3 weeks of constant, worsening, severe dysphagia, globus sensation, and odynophagia that has limited per oral intake severely to the point that she isn't able to tolerate any po and not able to take her medications. Admitted on 01/18/2021 with Admitted with dysphagia, odynophagia, pancytopenia, and hypokalemia. PMH of metastatic SCLC undergoing chemotherapy s/p XRT, transfusion-dependent anemia, thrombocytopenia, right breast CA s/p mastectomy 2016.  Precautions  Precautions Fall  Pain Assessment  Pain Assessment No/denies pain  Cognition  Arousal/Alertness Awake/alert  Behavior During Therapy Flat affect  Overall Cognitive Status Within Functional Limits for tasks assessed  Upper Extremity Assessment  Upper Extremity Assessment Generalized weakness  Lower Extremity Assessment  Lower Extremity Assessment Defer to PT evaluation  ADL  Overall ADL's  Needs assistance/impaired  Toilet Transfer Minimal assistance (rollator)  Toilet Transfer Details (indicate cue type and reason) patient with  significantly limited activity tolerance. performs sit to stand twice from edge of bed, first attempt having to sit back down immediately due to fatigue. Second attempt with min A and management of rollator patient able to take few side steps to head of bed.  Toileting- Clothing Manipulation and Hygiene Minimal assistance;Sit to/from stand  Toileting - Clothing Manipulation Details (indicate cue type and reason) to pull up underwear  Bed Mobility  Overal bed mobility Needs Assistance  Bed Mobility Supine to Sit;Sit to Supine  Supine to sit Supervision;HOB elevated  Sit to supine Supervision;HOB elevated  General bed mobility comments patient needed increased time for all bed mobility due to fatigue, no physical assistance  Balance  Overall balance assessment Needs assistance  Sitting-balance support No upper extremity supported  Sitting balance-Leahy Scale Good  Standing balance support Bilateral upper extremity supported  Standing balance-Leahy Scale Poor  Standing balance comment required UE support and min A to steady  Restrictions  Weight Bearing Restrictions No  Transfers  Overall transfer level Needs assistance  Equipment used 4-wheeled walker  Transfers Sit to/from Stand  Sit to Stand Min assist  General transfer comment patient needed min A for steadying and maangement of rollator  OT - End of Session  Equipment Utilized During Treatment Rolling walker  Activity Tolerance Patient limited by fatigue  Patient left in bed;with call bell/phone within reach;with bed alarm set  Nurse Communication Mobility status  OT Assessment/Plan  OT Plan Discharge plan needs to be updated  OT Visit Diagnosis Muscle weakness (generalized) (M62.81)  OT Frequency (ACUTE ONLY) Min 2X/week  Follow Up Recommendations Home health OT;Other (comment) (vs SNF pending progress)  OT Equipment None recommended by OT  AM-PAC OT "6  Clicks" Daily Activity Outcome Measure (Version 2)  Help from another  person eating meals? 4  Help from another person taking care of personal grooming? 3  Help from another person toileting, which includes using toliet, bedpan, or urinal? 3  Help from another person bathing (including washing, rinsing, drying)? 3  Help from another person to put on and taking off regular upper body clothing? 3  Help from another person to put on and taking off regular lower body clothing? 3  6 Click Score 19  OT Goal Progression  Progress towards OT goals Not progressing toward goals - comment (fatigue, still unable to eat at this time)  Acute Rehab OT Goals  Patient Stated Goal be able to walk  OT Goal Formulation With patient  Time For Goal Achievement 01/31/21  Potential to Achieve Goals Fair  ADL Goals  Pt Will Transfer to Toilet with supervision;squat pivot transfer;bedside commode  Pt Will Perform Toileting - Clothing Manipulation and hygiene with modified independence;sitting/lateral leans  Pt/caregiver will Perform Home Exercise Program Increased strength;Increased ROM;Right Upper extremity;With theraband;With written HEP provided  Additional ADL Goal #1 Patient will perform 10 min functional activity or exercise activity as evidence of improving activity tolerance  OT Time Calculation  OT Start Time (ACUTE ONLY) 1055  OT Stop Time (ACUTE ONLY) 1105  OT Time Calculation (min) 10 min  OT General Charges  $OT Visit 1 Visit  OT Treatments  $Self Care/Home Management  8-22 mins   Delbert Phenix OT OT pager: [phone]

## 2021-01-24 NOTE — Progress Notes (Signed)
PROGRESS NOTE  Tara Rodgers  DOB: May 10, 1946  PCP: Elby Showers, MD ZOX:[id number]  DOA: 02/02/2021  LOS: 9 days   Chief Complaint  Patient presents with  . Emesis  . Nausea  . Dysphagia    Brief narrative: Tara Rodgers is a 75 y.o. female with PMH significant for metastatic SCLC undergoing chemotherapy s/p XRT, transfusion-dependent anemia, thrombocytopenia, right breast CA s/p mastectomy 2016 on antiestrogen therapy. Patient presented to the ED on 01/21/2021 with complaint of 3 weeks of constant, worsening, severe dysphagia, globus sensation, and odynophagia that has limited per oral intake severely to the point that she was not able to tolerate any po and not able to take her medications.  In the ED, she appeared dehydrated, tachycardic to 130s, afebrile, normotensive, benign abdominal exam Labs with ketones in urine, potassium low at 2.9, metabolic acidosis, LFTs elevated.   Patient was admitted to hospitalist service See below for details  Subjective: Patient was seen and examined this morning. Sitting up in bed.  Not in distress.  Continues to have dysphagia. Underwent EGD this afternoon.  Assessment/Plan: Dysphagia, odynophagia -Initially planned for EGD but could not be done because of pancytopenia.   -Finally underwent EGD today 4/13.  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  -GI recommends continuation of IV Protonix Carafate, clear liquid diet.  Pending cytology brushing.  Candida and herpes.  Continue Diflucan in the meantime.  Pancytopenia -CBC being monitored.  Labs as below.  -Platelet count significantly improved, 65,000.Marland Kitchen  -ANC still remains low, hemoglobin consistently stable between 8-9.  - Recent Labs  Lab 01/21/21 0623 01/22/21 0618 01/28/2021 0534 02/05/2021 1307 02/09/2021 1507 01/22/2021 0610  WBC 1.5* 2.1* 2.1*  --  2.3* 1.5*   NEUTROABS 1.2* 1.1* 1.0*  --  1.1* 0.7*  HGB 9.1* 9.2* 8.8* 8.5* 8.7* 8.3*  HCT 27.3* 27.7* 26.5* 25.0* 26.0* 25.1*  MCV 82.0 82.0 82.6  --  81.8 82.8  PLT 22* 18* 21*  --  81* 65*   Moderate protein calorie malnutrition -Supplement protein as much as able  Hypokalemia/hypomagnesemia -Potassium and magnesium level low because of poor oral intake.  Aggressive replacement given.  Repeat levels tomorrow. Recent Labs  Lab 01/18/21 0446 01/19/21 0552 01/21/21 0623 01/22/21 0618 01/27/2021 0534 01/18/2021 0816 01/22/2021 1200 01/27/2021 1307 01/12/2021 1601 01/30/2021 0610 01/18/2021 0726  K 3.2*   < > 3.3* 3.2* 2.7*  --  2.5* 2.6* 2.7*  --  3.2*  MG 1.3*  --  1.3* 1.9  --  1.5*  --   --   --  1.9  --   PHOS  --   --  3.3  --   --   --   --   --   --   --   --    < > = values in this interval not displayed.   Stage IV small cell lung cancer -Seen on PET 1/28 started on etoposide, carboplatin and atezolizumab 11/22/2020, s/p palliative mediastinal XRT 1/31 - 2/15.  -Oncology, Dr. [name] following  Solid 1.2 cm hypoechoic lesion in the posterosuperior aspect of the right liver: Nonspecific.  -Defer to oncology   LFT elevation -Multifactorial, improved. Oncology suspects metastatic disease and chemotherapy. RUQ U/S without ductal dilatation. Recent Labs  Lab 01/19/21 0552  AST 20  ALT 109*  ALKPHOS 93  BILITOT 1.7*  PROT 5.7*  ALBUMIN 3.1*   HTN, tachycardia -Continues to  have tachycardia probably related to dehydration.  IV metoprolol, hopefully could change back to po formulation of medications soon.  Hypothyroidism -Continue synthroid to IV for now. Has had good response following TSH.  History of breast CA -On antiestrogen which we will hold while unable to take po.  History of covid-19 infection and s/p 2 doses of vaccine -Recommended 3rd dose.  Impaired mobility -PT eval obtained.  SNF recommended.  Mobility: PT eval obtained Code Status:   Code Status: DNR   Nutritional status: Body mass index is 22.71 kg/m. Nutrition Problem: Moderate Malnutrition Etiology: chronic illness,cancer and cancer related treatments Signs/Symptoms: energy intake < or equal to 75% for > or equal to 1 month,percent weight loss,mild fat depletion,mild muscle depletion Diet Order            Diet clear liquid Room service appropriate? Yes; Fluid consistency: Thin  Diet effective now                 DVT prophylaxis: SCDs Start: 01/27/2021 1939   Antimicrobials:  fluconazole Fluid: NS with potassium at 100 mill per hour Consultants: GI, oncology Family Communication:  None at bedside  Status is: Inpatient  Remains inpatient appropriate because: Continues to have dysphagia, pancytopenia  Dispo: The patient is from: Home              Anticipated d/c is to: Home with home health PT 1 to 2 days              Patient currently is not medically stable to d/c.   Difficult to place patient No   Infusions:  . 0.9 % NaCl with KCl 20 mEq / L 100 mL/hr at 01/30/2021 1335  . fluconazole (DIFLUCAN) IV 200 mg (01/31/2021 1452)    Scheduled Meds: . Chlorhexidine Gluconate Cloth  6 each Topical Daily  . feeding supplement  237 mL Oral TID BM  . fentaNYL  1 patch Transdermal Q72H  . levothyroxine  50 mcg Intravenous Daily  . mouth rinse  15 mL Mouth Rinse BID  . metoprolol tartrate  5 mg Intravenous Q6H  . nystatin  5 mL Oral QID  . pantoprazole (PROTONIX) IV  40 mg Intravenous Q24H  . sodium chloride flush  10-40 mL Intracatheter Q12H  . sodium chloride flush  3 mL Intravenous Q12H  . sucralfate  1 g Oral TID WC & HS    Antimicrobials: Anti-infectives (From admission, onward)   Start     Dose/Rate Route Frequency Ordered Stop   01/16/21 1000  fluconazole (DIFLUCAN) IVPB 200 mg        200 mg 100 mL/hr over 60 Minutes Intravenous Daily 01/15/21 1024     01/15/21 1300  cefTRIAXone (ROCEPHIN) 1 g in sodium chloride 0.9 % 100 mL IVPB  Status:  Discontinued        1  g 200 mL/hr over 30 Minutes Intravenous Every 24 hours 01/15/21 1210 01/21/21 1210   01/15/21 1100  fluconazole (DIFLUCAN) IVPB 400 mg        400 mg 100 mL/hr over 120 Minutes Intravenous  Once 01/15/21 1024 01/15/21 1326      PRN meds: acetaminophen **OR** [DISCONTINUED] acetaminophen, LORazepam, magic mouthwash w/lidocaine, ondansetron (ZOFRAN) IV, sodium chloride flush   Objective: Vitals:   01/25/2021 1325 01/25/2021 1521  BP: (!) 169/91 (!) 155/82  Pulse: (!) 101 (!) 108  Resp: (!) 23 16  Temp:  97.6 F (36.4 C)  SpO2: 100% 100%    Intake/Output Summary (Last 24  hours) at 01/21/2021 1613 Last data filed at 01/26/2021 1522 Gross per 24 hour  Intake 1438.62 ml  Output 2600 ml  Net -1161.38 ml   Filed Weights   01/16/21 0816  Weight: 51 kg   Weight change:  Body mass index is 22.71 kg/m.   Physical Exam: General exam: Pleasant elderly Caucasian female.  Persistent dysphagia Skin: No rashes, lesions or ulcers. HEENT: Atraumatic, normocephalic, no obvious bleeding.  No oral thrush on exam.  No bleeding Lungs: Clear to auscultation bilaterally CVS: Regular rate and rhythm GI/Abd soft, nontender, nondistended, bowel sound present CNS: Alert, awake, oriented x3 Psychiatry: Depressed, frustrated. Extremities: No pedal edema, no calf tenderness  Data Review: I have personally reviewed the laboratory data and studies available.  Recent Labs  Lab 01/21/21 0623 01/22/21 0618 01/13/2021 0534 01/18/2021 1307 01/17/2021 1507 01/20/2021 0610  WBC 1.5* 2.1* 2.1*  --  2.3* 1.5*  NEUTROABS 1.2* 1.1* 1.0*  --  1.1* 0.7*  HGB 9.1* 9.2* 8.8* 8.5* 8.7* 8.3*  HCT 27.3* 27.7* 26.5* 25.0* 26.0* 25.1*  MCV 82.0 82.0 82.6  --  81.8 82.8  PLT 22* 18* 21*  --  81* 65*   Recent Labs  Lab 01/18/21 0446 01/19/21 0552 01/21/21 0623 01/22/21 0618 02/07/2021 0534 01/18/2021 0816 01/20/2021 1200 01/27/2021 1307 01/29/2021 1601 02/04/2021 0610 01/15/2021 0726  NA 128*   < > 134* 134* 137  --  136 138  138  --  135  K 3.2*   < > 3.3* 3.2* 2.7*  --  2.5* 2.6* 2.7*  --  3.2*  CL 97*   < > 103 107 108  --  106 105 108  --  108  CO2 18*   < > 19* 18* 17*  --  17*  --  19*  --  19*  GLUCOSE 88   < > 96 104* 112*  --  98 99 117*  --  106*  BUN 8   < > 8 8 9   --  9 6* 8  --  8  CREATININE 0.56   < > 0.47 0.51 0.59  --  0.56 0.40* 0.55  --  0.52  CALCIUM 8.0*   < > 7.9* 7.8* 8.2*  --  8.0*  --  8.2*  --  7.9*  MG 1.3*  --  1.3* 1.9  --  1.5*  --   --   --  1.9  --   PHOS  --   --  3.3  --   --   --   --   --   --   --   --    < > = values in this interval not displayed.    F/u labs ordered Unresulted Labs (From admission, onward)          Start     Ordered   01/25/21 8676  Basic metabolic panel  Daily,   R     Question:  Specimen collection method  Answer:  Unit=Unit collect   01/25/2021 1612   01/25/21 0500  Magnesium  Tomorrow morning,   STAT       Question:  Specimen collection method  Answer:  Unit=Unit collect   01/27/2021 1612   01/25/21 0500  Phosphorus  Tomorrow morning,   R       Question:  Specimen collection method  Answer:  Unit=Unit collect   01/28/2021 1612   01/25/21 0500  Hepatic function panel  Tomorrow morning,   R  Question:  Specimen collection method  Answer:  Unit=Unit collect   01/20/2021 1613   01/15/2021 0500  CBC with Differential/Platelet  Daily,   R     Question:  Specimen collection method  Answer:  Unit=Unit collect   01/28/2021 0802   01/28/2021 0805  Mpo/pr-3 (anca) antibodies  (Anti-Neutrophilic Cystoplasmic Antibody Panel (PNL))  Add-on,   AD       Question:  Specimen collection method  Answer:  Unit=Unit collect   02/06/2021 0804   02/07/2021 0804  C3 complement  Add-on,   AD       Question:  Specimen collection method  Answer:  Unit=Unit collect   02/09/2021 0804   01/13/2021 0804  C4 complement  Add-on,   AD       Question:  Specimen collection method  Answer:  Unit=Unit collect   02/05/2021 0804          Signed, Terrilee Croak, MD [facility] 01/20/2021

## 2021-01-24 NOTE — Progress Notes (Signed)
Pharmacy Antibiotic Note  Tara Rodgers is a 75 y.o. female with SCLC currently undergoing chemotherapy treatment, presented to the ED on 01/15/2021 with dehydration and dysphagia/odynophagia.  She was scheduled for EGD on 4/12, but procedure cancelled d/t hypokalemia.  She's currently on fluconazole for suspected candida esophagitis.  Today, 01/22/2021: - day #10 fluconazole - afeb, ANC 0.7 - scr stable at 0.52 (crcl~43)  Plan: - continue fluconazole 200 mg daily - with stable renal function, pharmacy will sign off for fluconazole. Re-consult [place] if need further assistance. ____________________________________________  Height: 4\' 11"  (149.9 cm) Weight: 51 kg (112 lb 7 oz) IBW/kg (Calculated) : 43.2  Temp (24hrs), Avg:98.1 F (36.7 C), Min:97.8 F (36.6 C), Max:98.4 F (36.9 C)  Recent Labs  Lab 01/21/21 0623 01/22/21 0618 01/20/2021 0534 01/12/2021 1200 01/20/2021 1307 02/04/2021 1507 01/20/2021 1601 01/27/2021 0610 01/15/2021 0726  WBC 1.5* 2.1* 2.1*  --   --  2.3*  --  1.5*  --   CREATININE 0.47 0.51 0.59 0.56 0.40*  --  0.55  --  0.52    Estimated Creatinine Clearance: 42.7 mL/min (by C-G formula based on SCr of 0.52 mg/dL).    Allergies  Allergen Reactions  . Fosamax [Alendronate Sodium] Other (See Comments)    Aching all over     Thank you for allowing pharmacy to be a part of this patient's care.  Lynelle Doctor 01/16/2021 8:15 AM

## 2021-01-24 NOTE — Anesthesia Preprocedure Evaluation (Signed)
Anesthesia Evaluation  Patient identified by MRN, date of birth, ID band Patient awake  General Assessment Comment:Metastatic lung cancer  Reviewed: Allergy & Precautions, NPO status , Patient's Chart, lab work & pertinent test results  Airway Mallampati: II  TM Distance: >3 FB Neck ROM: Full    Dental no notable dental hx.    Pulmonary COPD,    Pulmonary exam normal breath sounds clear to auscultation       Cardiovascular hypertension, Pt. on medications Normal cardiovascular exam Rhythm:Regular Rate:Normal     Neuro/Psych negative neurological ROS  negative psych ROS   GI/Hepatic Neg liver ROS, GERD  ,  Endo/Other  Hypothyroidism   Renal/GU negative Renal ROS  negative genitourinary   Musculoskeletal negative musculoskeletal ROS (+)   Abdominal   Peds negative pediatric ROS (+)  Hematology negative hematology ROS (+)   Anesthesia Other Findings   Reproductive/Obstetrics negative OB ROS                             Anesthesia Physical Anesthesia Plan  ASA: III  Anesthesia Plan: MAC   Post-op Pain Management:    Induction: Intravenous  PONV Risk Score and Plan: 2 and Propofol infusion and Treatment may vary due to age or medical condition  Airway Management Planned: Simple Face Mask  Additional Equipment:   Intra-op Plan:   Post-operative Plan:   Informed Consent: I have reviewed the patients History and Physical, chart, labs and discussed the procedure including the risks, benefits and alternatives for the proposed anesthesia with the patient or authorized representative who has indicated his/her understanding and acceptance.     Dental advisory given  Plan Discussed with: CRNA and Surgeon  Anesthesia Plan Comments:         Anesthesia Quick Evaluation

## 2021-01-24 NOTE — Brief Op Note (Addendum)
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 Peds endoscope advanced through stricture without resistance. 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 Esophageal brushing obtained to check for Candida and Herpes. Continue Carafate slurry. Increase PPI to IV Q 12 hours. Clear liquid diet. Supportive care. Will f/u.

## 2021-01-25 ENCOUNTER — Ambulatory Visit: Payer: Medicare Other | Admitting: Internal Medicine

## 2021-01-25 ENCOUNTER — Encounter (HOSPITAL_COMMUNITY): Payer: Self-pay | Admitting: Gastroenterology

## 2021-01-25 DIAGNOSIS — D701 Agranulocytosis secondary to cancer chemotherapy: Secondary | ICD-10-CM | POA: Diagnosis not present

## 2021-01-25 DIAGNOSIS — R1310 Dysphagia, unspecified: Secondary | ICD-10-CM | POA: Diagnosis not present

## 2021-01-25 DIAGNOSIS — T451X5A Adverse effect of antineoplastic and immunosuppressive drugs, initial encounter: Secondary | ICD-10-CM | POA: Diagnosis not present

## 2021-01-25 LAB — CBC WITH DIFFERENTIAL/PLATELET
Abs Immature Granulocytes: 0.1 10*3/uL — ABNORMAL HIGH (ref 0.00–0.07)
Basophils Absolute: 0 10*3/uL (ref 0.0–0.1)
Basophils Relative: 1 %
Eosinophils Absolute: 0 10*3/uL (ref 0.0–0.5)
Eosinophils Relative: 0 %
HCT: 24 % — ABNORMAL LOW (ref 36.0–46.0)
Hemoglobin: 7.9 g/dL — ABNORMAL LOW (ref 12.0–15.0)
Immature Granulocytes: 7 %
Lymphocytes Relative: 16 %
Lymphs Abs: 0.2 10*3/uL — ABNORMAL LOW (ref 0.7–4.0)
MCH: 27.3 pg (ref 26.0–34.0)
MCHC: 32.9 g/dL (ref 30.0–36.0)
MCV: 83 fL (ref 80.0–100.0)
Monocytes Absolute: 0.4 10*3/uL (ref 0.1–1.0)
Monocytes Relative: 28 %
Neutro Abs: 0.7 10*3/uL — ABNORMAL LOW (ref 1.7–7.7)
Neutrophils Relative %: 48 %
Platelets: 79 10*3/uL — ABNORMAL LOW (ref 150–400)
RBC: 2.89 MIL/uL — ABNORMAL LOW (ref 3.87–5.11)
RDW: 15.9 % — ABNORMAL HIGH (ref 11.5–15.5)
WBC: 1.4 10*3/uL — CL (ref 4.0–10.5)
nRBC: 0 % (ref 0.0–0.2)

## 2021-01-25 LAB — HEPATIC FUNCTION PANEL
ALT: 38 U/L (ref 0–44)
AST: 21 U/L (ref 15–41)
Albumin: 3.2 g/dL — ABNORMAL LOW (ref 3.5–5.0)
Alkaline Phosphatase: 80 U/L (ref 38–126)
Bilirubin, Direct: 0.4 mg/dL — ABNORMAL HIGH (ref 0.0–0.2)
Indirect Bilirubin: 1.2 mg/dL — ABNORMAL HIGH (ref 0.3–0.9)
Total Bilirubin: 1.6 mg/dL — ABNORMAL HIGH (ref 0.3–1.2)
Total Protein: 5.4 g/dL — ABNORMAL LOW (ref 6.5–8.1)

## 2021-01-25 LAB — BASIC METABOLIC PANEL
Anion gap: 10 (ref 5–15)
BUN: 7 mg/dL — ABNORMAL LOW (ref 8–23)
CO2: 19 mmol/L — ABNORMAL LOW (ref 22–32)
Calcium: 8 mg/dL — ABNORMAL LOW (ref 8.9–10.3)
Chloride: 105 mmol/L (ref 98–111)
Creatinine, Ser: 0.51 mg/dL (ref 0.44–1.00)
GFR, Estimated: >60 mL/min (ref >60)
Glucose, Bld: 102 mg/dL — ABNORMAL HIGH (ref 70–99)
Potassium: 3 mmol/L — ABNORMAL LOW (ref 3.5–5.1)
Sodium: 134 mmol/L — ABNORMAL LOW (ref 135–145)

## 2021-01-25 LAB — C3 COMPLEMENT: C3 Complement: 127 mg/dL (ref 82–167)

## 2021-01-25 LAB — C4 COMPLEMENT: Complement C4, Body Fluid: 41 mg/dL — ABNORMAL HIGH (ref 12–38)

## 2021-01-25 LAB — PHOSPHORUS: Phosphorus: 3.2 mg/dL (ref 2.5–4.6)

## 2021-01-25 LAB — MAGNESIUM: Magnesium: 1.5 mg/dL — ABNORMAL LOW (ref 1.7–2.4)

## 2021-01-25 MED ORDER — PANTOPRAZOLE SODIUM 40 MG IV SOLR
40.0000 mg | Freq: Two times a day (BID) | INTRAVENOUS | Status: DC
  Administered 2021-01-25 – 2021-02-05 (×21): 40 mg via INTRAVENOUS
  Filled 2021-01-25 (×21): qty 40

## 2021-01-25 MED ORDER — MAGNESIUM SULFATE 2 GM/50ML IV SOLN
2.0000 g | Freq: Once | INTRAVENOUS | Status: AC
  Administered 2021-01-25: 2 g via INTRAVENOUS
  Filled 2021-01-25: qty 50

## 2021-01-25 MED ORDER — POTASSIUM CHLORIDE 10 MEQ/100ML IV SOLN
10.0000 meq | INTRAVENOUS | Status: AC
  Administered 2021-01-25 (×6): 10 meq via INTRAVENOUS
  Filled 2021-01-25 (×6): qty 100

## 2021-01-25 NOTE — Progress Notes (Signed)
PROGRESS NOTE  GIGI ONSTAD  DOB: 05-28-1946  PCP: Elby Showers, MD IOM:[id number]  DOA: 01/28/2021  LOS: 10 days   Chief Complaint  Patient presents with  . Emesis  . Nausea  . Dysphagia    Brief narrative: DENINE BROTZ is a 75 y.o. female with PMH significant for metastatic SCLC undergoing chemotherapy s/p XRT, transfusion-dependent anemia, thrombocytopenia, right breast CA s/p mastectomy 2016 on antiestrogen therapy. Patient presented to the ED on 02/03/2021 with complaint of 3 weeks of constant, worsening, severe dysphagia, globus sensation, and odynophagia that has limited per oral intake severely to the point that she was not able to tolerate any po and not able to take her medications.  In the ED, she appeared dehydrated, tachycardic to 130s, afebrile, normotensive, benign abdominal exam Labs with ketones in urine, potassium low at 2.9, metabolic acidosis, LFTs elevated.   Patient was admitted to hospitalist service See below for details  Subjective: Patient was seen and examined this morning. Sitting up in bed.  Not in distress.  Continues to have dysphagia.  Tearful when I asked her about her long term outlook and wish.  Assessment/Plan: Dysphagia, odynophagia -Initially planned for EGD but could not be done because of pancytopenia.   -Finally underwent EGD on 4/13.  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  -GI recommends continuation of IV Protonix Carafate, clear liquid diet.  Pending cytology brushing.  Candida and herpes.  Continue Diflucan in the meantime. -4/14, noted a plan for esophageal stenting on Monday.  Pancytopenia -CBC being monitored.  Labs as below.  -Platelet count significantly improved, 79,000 today -ANC still remains low, hemoglobin at 7.9.  Per oncology, to transfuse if less than 7.5 Recent Labs  Lab 01/22/21 0618  01/28/2021 0534 02/07/2021 1307 01/15/2021 1507 02/02/2021 0610 01/25/21 0512  WBC 2.1* 2.1*  --  2.3* 1.5* 1.4*  NEUTROABS 1.1* 1.0*  --  1.1* 0.7* 0.7*  HGB 9.2* 8.8* 8.5* 8.7* 8.3* 7.9*  HCT 27.7* 26.5* 25.0* 26.0* 25.1* 24.0*  MCV 82.0 82.6  --  81.8 82.8 83.0  PLT 18* 21*  --  81* 65* 79*   Moderate protein calorie malnutrition -Supplement protein as much as able  Hypokalemia/hypomagnesemia -Potassium low at 3, magnesium low at 1.5 today.  IV replacement given.  Repeat tomorrow.   Recent Labs  Lab 01/21/21 0623 01/22/21 0618 01/21/2021 0534 02/07/2021 0816 01/12/2021 1200 01/30/2021 1307 02/01/2021 1601 01/25/2021 0610 01/28/2021 0726 01/25/21 0512  K 3.3* 3.2*   < >  --  2.5* 2.6* 2.7*  --  3.2* 3.0*  MG 1.3* 1.9  --  1.5*  --   --   --  1.9  --  1.5*  PHOS 3.3  --   --   --   --   --   --   --   --  3.2   < > = values in this interval not displayed.   Stage IV small cell lung cancer -Seen on PET 1/28 started on etoposide, carboplatin and atezolizumab 11/22/2020, s/p palliative mediastinal XRT 1/31 - 2/15.  -Oncology, Dr. [name] following  Solid 1.2 cm hypoechoic lesion in the posterosuperior aspect of the right liver: Nonspecific.  -Defer to oncology   HTN, tachycardia -Continues to have tachycardia probably related to dehydration.  IV metoprolol, hopefully could change back to po formulation of medications soon.  Hypothyroidism -Continue synthroid to IV for now. Has had  good response following TSH.  History of breast CA -On antiestrogen which we will hold while unable to take po.  History of covid-19 infection and s/p 2 doses of vaccine -Recommended 3rd dose.  Impaired mobility -PT eval obtained. SNF recommended.  But patient wants to go home ultimately.  Mobility: PT eval obtained Code Status:   Code Status: DNR  Nutritional status: Body mass index is 22.17 kg/m. Nutrition Problem: Moderate Malnutrition Etiology: chronic illness,cancer and cancer related  treatments Signs/Symptoms: energy intake < or equal to 75% for > or equal to 1 month,percent weight loss,mild fat depletion,mild muscle depletion Diet Order            DIET SOFT Room service appropriate? Yes; Fluid consistency: Thin  Diet effective now                 DVT prophylaxis: SCDs Start: 01/31/2021 1939   Antimicrobials:  IV fluconazole Fluid: NS with potassium at 100 mill per hour Consultants: GI, oncology Family Communication:  None at bedside  Status is: Inpatient  Remains inpatient appropriate because: Continues to have dysphagia, pancytopenia  Dispo: The patient is from: Home              Anticipated d/c is to: Home with home health PT 1 to 2 days              Patient currently is not medically stable to d/c.   Difficult to place patient No   Infusions:  . 0.9 % NaCl with KCl 20 mEq / L 100 mL/hr at 01/25/21 0646  . fluconazole (DIFLUCAN) IV 200 mg (01/25/21 1005)    Scheduled Meds: . Chlorhexidine Gluconate Cloth  6 each Topical Daily  . feeding supplement  237 mL Oral TID BM  . fentaNYL  1 patch Transdermal Q72H  . levothyroxine  50 mcg Intravenous Daily  . mouth rinse  15 mL Mouth Rinse BID  . metoprolol tartrate  5 mg Intravenous Q6H  . nystatin  5 mL Oral QID  . pantoprazole (PROTONIX) IV  40 mg Intravenous Q12H  . sodium chloride flush  10-40 mL Intracatheter Q12H  . sodium chloride flush  3 mL Intravenous Q12H  . sucralfate  1 g Oral TID WC & HS    Antimicrobials: Anti-infectives (From admission, onward)   Start     Dose/Rate Route Frequency Ordered Stop   01/16/21 1000  fluconazole (DIFLUCAN) IVPB 200 mg        200 mg 100 mL/hr over 60 Minutes Intravenous Daily 01/15/21 1024     01/15/21 1300  cefTRIAXone (ROCEPHIN) 1 g in sodium chloride 0.9 % 100 mL IVPB  Status:  Discontinued        1 g 200 mL/hr over 30 Minutes Intravenous Every 24 hours 01/15/21 1210 01/21/21 1210   01/15/21 1100  fluconazole (DIFLUCAN) IVPB 400 mg        400 mg 100  mL/hr over 120 Minutes Intravenous  Once 01/15/21 1024 01/15/21 1326      PRN meds: acetaminophen **OR** [DISCONTINUED] acetaminophen, LORazepam, magic mouthwash w/lidocaine, ondansetron (ZOFRAN) IV, sodium chloride flush   Objective: Vitals:   01/25/21 0504 01/25/21 0530  BP: (!) 144/85   Pulse: (!) 113 97  Resp: 17   Temp: 98.2 F (36.8 C)   SpO2: 100%     Intake/Output Summary (Last 24 hours) at 01/25/2021 1701 Last data filed at 01/25/2021 1233 Gross per 24 hour  Intake --  Output 2000 ml  Net -2000 ml  Filed Weights   01/16/21 0816 01/25/21 0530  Weight: 51 kg 49.8 kg   Weight change:  Body mass index is 22.17 kg/m.   Physical Exam: General exam: Pleasant elderly Caucasian female.  Persistent dysphagia Skin: No rashes, lesions or ulcers. HEENT: Atraumatic, normocephalic, no obvious bleeding.  No oral thrush on exam.  No bleeding Lungs: Clear to auscultation bilaterally CVS: Regular rate and rhythm GI/Abd soft, nontender, nondistended, bowel sound present CNS: Alert, awake, oriented x3 Psychiatry: Depressed, frustrated.  Tearful Extremities: No pedal edema, no calf tenderness  Data Review: I have personally reviewed the laboratory data and studies available.  Recent Labs  Lab 01/22/21 0618 02/05/2021 0534 01/28/2021 1307 01/13/2021 1507 01/22/2021 0610 01/25/21 0512  WBC 2.1* 2.1*  --  2.3* 1.5* 1.4*  NEUTROABS 1.1* 1.0*  --  1.1* 0.7* 0.7*  HGB 9.2* 8.8* 8.5* 8.7* 8.3* 7.9*  HCT 27.7* 26.5* 25.0* 26.0* 25.1* 24.0*  MCV 82.0 82.6  --  81.8 82.8 83.0  PLT 18* 21*  --  81* 65* 79*   Recent Labs  Lab 01/21/21 0623 01/22/21 0618 01/21/2021 0534 01/31/2021 0816 01/19/2021 1200 01/13/2021 1307 01/12/2021 1601 01/18/2021 0610 01/28/2021 0726 01/25/21 0512  NA 134* 134* 137  --  136 138 138  --  135 134*  K 3.3* 3.2* 2.7*  --  2.5* 2.6* 2.7*  --  3.2* 3.0*  CL 103 107 108  --  106 105 108  --  108 105  CO2 19* 18* 17*  --  17*  --  19*  --  19* 19*  GLUCOSE 96 104*  112*  --  98 99 117*  --  106* 102*  BUN 8 8 9   --  9 6* 8  --  8 7*  CREATININE 0.47 0.51 0.59  --  0.56 0.40* 0.55  --  0.52 0.51  CALCIUM 7.9* 7.8* 8.2*  --  8.0*  --  8.2*  --  7.9* 8.0*  MG 1.3* 1.9  --  1.5*  --   --   --  1.9  --  1.5*  PHOS 3.3  --   --   --   --   --   --   --   --  3.2    F/u labs ordered Unresulted Labs (From admission, onward)          Start     Ordered   01/25/21 5465  Basic metabolic panel  Daily,   R     Question:  Specimen collection method  Answer:  Unit=Unit collect   01/30/2021 1612   01/22/2021 0500  CBC with Differential/Platelet  Daily,   R     Question:  Specimen collection method  Answer:  Unit=Unit collect   02/06/2021 0802          Signed, Terrilee Croak, MD [facility] 01/25/2021

## 2021-01-25 NOTE — Progress Notes (Signed)
Sandre Kitty 12:38 PM  Subjective: Patient seen and hospital computer reviewed and case discussed with my partner Dr. Michail Sermon and she did not have any problem with her endoscopy which was reviewed and she is tolerating liquids without discomfort but still has to drink them slowly and I was asked to discuss an esophageal stent with the patient by her oncologist  Objective: Vital signs stable afebrile no acute distress patient not examined today chemistries okay except for low potassium pancytopenia about the same  Assessment: Radiation stricture in patient with metastatic cancer  Plan: The risks benefits methods and options of esophageal stent placement was discussed with the patient and if she would like I am happy to proceed next week and I will check with her later this weekend to discuss it further and please call me sooner if I can be of any further assistance this weekend  [facility]  office [phone] After 5PM or if no answer call [phone]

## 2021-01-25 NOTE — Progress Notes (Signed)
HEMATOLOGY-ONCOLOGY PROGRESS NOTE  SUBJECTIVE: The patient is clinically stable, pain is controlled, dysphagia remains the same.  She is on liquid diet.   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 [facility]) Staging form: Breast, AJCC 7th Edition - Clinical: Stage IIA (T2, N0, M0) - Signed by [name], MD on 02/08/2015 Laterality: Right Estrogen receptor status: Positive Progesterone receptor status: Positive HER2 status: Negative - Pathologic stage from 03/23/2015: Stage Unknown (T2, NX, cM0) - Unsigned  Small cell lung cancer ([place]) Staging form: Lung, AJCC 8th Edition - Clinical stage from 11/13/2020: Stage IV (cTX, cN3, cM1) - Signed by [name], MD on 11/16/2020 Stage prefix: Initial diagnosis     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) and adjuvant radiation ([name]).   12/13/2014 Mammogram   Right breast: possible asymmetry warranting further evaluation with spot compression views and possibly ultrasound   12/16/2014 Breast US   Right breast: no definitive abnormality within the superior aspect of the right breast to correspond with the mammographic finding.   01/10/2015 Breast MRI   Right breast: irregular rim enhancing mass within the superior right breast at 11:30 measuring 2.5 x 2.4 x 2.4 cm. No abnormal enhancement is identified within the skin of the superior right breast in the area of concern on mammogram.   01/12/2015 Breast US   Second look diagnostic mammogram and ultrasound showed a 2.3 cm mass in the upper midline right breast. No axillary adenopathy.   01/12/2015 Initial Biopsy   Right breast needle core bx (upper midline): Invasive ductal carcinoma, grade 2, ER+  (100%), PR+ (77%), HER2/neu negative (ratio 1.15), Ki67 20%    01/30/2015 Procedure   Breast High/Moderate Risk panel ([facility]) reveals no clinically significant variant at ATM, BRCA1, BRCA2, CDH1, CHEK2, PALB2, PTEN, STK11, and TP53.   02/08/2015 Clinical Stage   Stage IIA (T2 N0)   03/23/2015 Definitive Surgery   Right mastectomy ([name]): invasive adenocarcinoma, grade 3, 2.9 cm, HER2/neu repeated, negative (ratio 1.23)   03/23/2015 Oncotype testing   Score: 8 (6% ROR). No chemotherapy [name]).   03/23/2015 Pathologic Stage   Stage IIA: pT2 pNx   05/04/2015 -  Anti-estrogen oral therapy   Letrozole 2.$RemoveBefo'5mg'[id number]$  daily. Planned duration of therapy at least 5 years.    07/25/2015 Survivorship   Survivorship visit completed and copy of care plan provided to patient.   12/15/2015 Mammogram   IMPRESSION: No mammographic evidence of malignancy. A result letter of this screening mammogram will be mailed directly to the patient.   12/23/2016 Mammogram   IMPRESSION: No mammographic evidence of malignancy. A result letter of this screening mammogram will be mailed directly to the patient.    12/30/2017 Mammogram   12/30/2017 Mammogram IMPRESSION: No mammographic evidence of malignancy. A result letter of this screening mammogram will be mailed directly to the patient.   03/27/2020 Mammogram   IMPRESSION: No mammographic evidence of malignancy. A result letter of this screening mammogram will be mailed directly to the patient.   10/26/2020 Imaging   CT chest IMPRESSION: 1. Bulky mediastinal, thoracic inlet, right supraclavicular, and left axillary lymphadenopathy consistent with metastatic disease. Associated numerous bilateral pulmonary nodules and masses, most prominently in the right middle lobe with dominant 4.1 cm peripheral mass lesion. 2. 16  mm rim enhancing lesion posterior right liver consistent with metastatic involvement. 3. 13 mm hypoenhancing lesion in the tail of pancreas.  Primary neoplasm or metastatic involvement could have this appearance. 4. Multiple nodules in the upper abdomen concerning for peritoneal/mesenteric metastatic disease. 5. Small right and tiny left pleural effusions with bibasilar atelectasis. 6. Aortic Atherosclerosis (ICD10-I70.0).   11/10/2020 PET scan   IMPRESSION: 1. Extensive hypermetabolic multi organ metastatic disease. 2. Bulky intensely hypermetabolic RIGHT supraclavicular, mediastinal and LEFT axillary adenopathy. 3. Hypermetabolic pulmonary mass in the RIGHT upper lobe with additional hypermetabolic nodules. 4. Hypermetabolic solitary hepatic metastasis. 5. Hypermetabolic lesion within the pancreas is also favored metastatic lesion. 6. Multifocal hypermetabolic skeletal metastasis. 7. Bilateral pleural effusions.   11/13/2020 Procedure   Thoracentesis  IMPRESSION: Successful ultrasound guided right thoracentesis yielding 200 mL of pleural fluid.   Small cell lung cancer ([place])  11/13/2020 Pathology Results   Right supraclavicular LN Biopsy  FINAL MICROSCOPIC DIAGNOSIS:   A. LYMPH NODE, RIGHT SUPRACLAVICULAR, NEEDLE CORE BIOPSY:  - Most consistent with small cell carcinoma.   COMMENT:   Tumor is limited in quantity, and partially necrotic.  TTF-1 and  Synaptophysin are positive.  Ki-67 proliferation index reaches 80-90%.  GATA-3, ER and p40 are negative.  The morphologic and immunophenotypic  characteristics are most suggestive of small cell carcinoma.  TTF-1  positive staining is compatible with origin from the lung; however, it  does not exclude origin from other entities.  Please note that distinct  nodal tissue is not identified in the submitted material.  Results  reported to Dr. [name] on 11/16/2020.  Dr. Melina Copa reviewed the case   11/13/2020 Cancer Staging   Staging form: Lung, AJCC 8th Edition - Clinical stage from 11/13/2020: Stage IV (cTX, cN3, cM1) - Signed by [name], MD on 11/16/2020 Stage prefix:  Initial diagnosis   11/13/2020 - 11/28/2020 Radiation Therapy   palliative radiation to [facility] with Dr Lisbeth Renshaw 11/13/20-11/28/20   11/16/2020 Initial Diagnosis   Small cell lung cancer ([place])   11/22/2020 -  Chemotherapy   First line etoposide D1-3, carboplatin and atezolizumab D1 q3weeks beginning 11/22/20.        11/29/2020 Procedure   PAC placed on 11/29/20   01/01/2021 Imaging   IMPRESSION: 1. Significant interval decrease in size of the right middle lobe lung mass and significant interval decrease in size of the mediastinal and right hilar lymph nodes. 2. Resolution of right supraclavicular and left axillary adenopathy. 3. Stable miliary pattern of pulmonary nodules. No new or progressive findings. 4. Stable diffuse sclerotic metastatic bone disease. No findings for progression. 5. Interval decrease in size of the pancreatic mass and adjacent lymph node. 6. Slightly smaller segment 7 liver lesion. No new or progressive findings. 7. Emphysema and aortic atherosclerosis.   Aortic Atherosclerosis (ICD10-I70.0) and Emphysema (ICD10-J43.9).        REVIEW OF SYSTEMS:   Constitutional: Denies fevers, chills Eyes: Denies blurriness of vision Ears, nose, mouth, throat, and face: Denies mucositis, has dysphagia and odynophagia. Respiratory: Denies cough, dyspnea or wheezes Cardiovascular: Denies palpitation, chest discomfort Gastrointestinal: Reports severe dysphagia Skin: Denies abnormal skin rashes Lymphatics: Denies new lymphadenopathy or easy bruising Neurological:Denies numbness, tingling or new weaknesses Behavioral/Psych: Mood is stable, no new changes  Extremities: No lower extremity edema All other systems were reviewed with the patient and are negative.  I have reviewed the past medical history, past surgical history, social history and family history with the patient and they are unchanged from previous note.  PHYSICAL EXAMINATION: ECOG PERFORMANCE STATUS: 3 -  Symptomatic, >50% confined to bed  Vitals:   01/25/21 0504 01/25/21 0530  BP: (!) 144/85   Pulse: (!) 113 97  Resp: 17   Temp: 98.2 F (36.8 C)   SpO2: 100%    Filed Weights   01/16/21 0816 01/25/21 0530  Weight: 112 lb 7 oz (51 kg) 109 lb 12.6 oz (49.8 kg)    Intake/Output from previous day: 04/13 0701 - 04/14 0700 In: 313.4 [I.V.:313.4] Out: 1700 [Urine:1700]  GENERAL: Chronically ill-appearing female, no distress SKIN: skin color, texture, turgor are normal, no rashes or significant lesions, no petechia or ecchymosis EYES: normal, Conjunctiva are pink and non-injected, sclera clear ABDOMEN:abdomen soft, non-tender and normal bowel sounds NEURO: alert & oriented x 3 with fluent speech, no focal motor/sensory deficits  LABORATORY DATA:  I have reviewed the data as listed CMP Latest Ref Rng & Units 01/25/2021 01/19/2021 01/30/2021  Glucose 70 - 99 mg/dL 102(H) 106(H) 117(H)  BUN 8 - 23 mg/dL 7(L) 8 8  Creatinine 0.44 - 1.00 mg/dL 0.51 0.52 0.55  Sodium 135 - 145 mmol/L 134(L) 135 138  Potassium 3.5 - 5.1 mmol/L 3.0(L) 3.2(L) 2.7(LL)  Chloride 98 - 111 mmol/L 105 108 108  CO2 22 - 32 mmol/L 19(L) 19(L) 19(L)  Calcium 8.9 - 10.3 mg/dL 8.0(L) 7.9(L) 8.2(L)  Total Protein 6.5 - 8.1 g/dL 5.4(L) - -  Total Bilirubin 0.3 - 1.2 mg/dL 1.6(H) - -  Alkaline Phos 38 - 126 U/L 80 - -  AST 15 - 41 U/L 21 - -  ALT 0 - 44 U/L 38 - -    Lab Results  Component Value Date   WBC 1.4 (LL) 01/25/2021   HGB 7.9 (L) 01/25/2021   HCT 24.0 (L) 01/25/2021   MCV 83.0 01/25/2021   PLT 79 (L) 01/25/2021   NEUTROABS 0.7 (L) 01/25/2021    DG Chest 2 View  Result Date: 02/04/2021 CLINICAL DATA:  Dysphagia and chest pain with nausea and gagging as well as vomiting 3 weeks. History of metastatic small cell lung cancer with last chemotherapy 2 weeks ago. EXAM: CHEST - 2 VIEW COMPARISON:  11/13/2020 and CT 01/01/2021 FINDINGS: Right IJ Port-A-Cath has tip over the SVC. Lungs are adequately inflated  without focal airspace consolidation or effusion. Cardiomediastinal silhouette is normal. Stable moderate sclerotic compression fractures over the thoracic and lumbar spine compared to 01/01/2021, although these are new compared to 10/26/2020. IMPRESSION: 1.  No acute cardiopulmonary disease. 2. Several moderate sclerotic compression fractures of the thoracic and lumbar spine new since January 2022 but unchanged from 01/01/2021 likely metastatic in origin. Electronically Signed   By: Marin Olp M.D.   On: 02/04/2021 14:58   CT CHEST ABDOMEN PELVIS W CONTRAST  Result Date: 01/02/2021 CLINICAL DATA:  Small cell lung cancer. History of chemotherapy and radiation therapy. EXAM: CT CHEST, ABDOMEN, AND PELVIS WITH CONTRAST TECHNIQUE: Multidetector CT imaging of the chest, abdomen and pelvis was performed following the standard protocol during bolus administration of intravenous contrast. CONTRAST:  17mL OMNIPAQUE IOHEXOL 300 MG/ML  SOLN COMPARISON:  CT abdomen/pelvis 11/28/2020 and PET-CT 11/10/2020 FINDINGS: CT CHEST FINDINGS Cardiovascular: The heart is normal in size. No pericardial effusion. Stable tortuosity and calcification of the thoracic aorta and stable scattered coronary artery calcifications. The right IJ Port-A-Cath is in good position without complicating features. Mediastinum/Nodes: Much improved mediastinal and right hilar adenopathy. Anterior mediastinal nodal mass measures 20 x 12 mm on image 23/2. This previously measured  4.1 x 3.5 cm. Subcarinal node measures 9.5 mm on image 27/2. This previously measured 20 mm. On the prior PET-CT there is a large right supraclavicular nodal mass. I do not see any residual measurable lymphadenopathy. The hypermetabolic left axillary adenopathy seen on the prior PET-CT has resolved. The esophagus is grossly normal. Lungs/Pleura: The peripheral right middle lobe lung mass is much smaller. It measures approximately 4.2 x 2.0 cm on the prior CT scan and now  measures approximately 2.3 x 1.0 cm. Persistent miliary pattern of pulmonary nodules throughout both lungs. No new lung lesions. No pleural effusions. Musculoskeletal: Stable appearing diffuse sclerotic T7 and T10 pathologic compression fractures are again noted. Do not see any obvious new or progressive metastatic bone disease. Metastatic disease CT ABDOMEN PELVIS FINDINGS Hepatobiliary: 1.7 x 1.4 cm segment 7 liver lesion previously measured approximately 2.3 x 1.9 cm. No new hepatic metastatic lesions. The gallbladder is unremarkable.  No common bile duct dilatation. Pancreas: The 17 mm pancreatic mass in the body tail junction region now measures approximately 7 mm. There was an adjacent lymph node which measured measured 9 mm and this is no longer identified. Spleen: Normal size.  No focal lesions. Adrenals/Urinary Tract: The adrenal glands and kidneys are unremarkable. No evidence of adrenal gland metastatic disease. The bladder is unremarkable. Stomach/Bowel: The stomach, duodenum, small bowel and colon are unremarkable. Vascular/Lymphatic: Stable atherosclerotic calcifications involving the aorta and branch vessels. No aneurysm or dissection. The major venous structures are patent. No mesenteric or retroperitoneal mass or lymphadenopathy. Reproductive: The uterus and ovaries are unremarkable. Other: No pelvic mass or free pelvic fluid collections. No inguinal mass or adenopathy. Musculoskeletal: Stable severe degenerative changes involving the left hip. Stable scattered sclerotic metastatic bone disease without findings for progression. IMPRESSION: 1. Significant interval decrease in size of the right middle lobe lung mass and significant interval decrease in size of the mediastinal and right hilar lymph nodes. 2. Resolution of right supraclavicular and left axillary adenopathy. 3. Stable miliary pattern of pulmonary nodules. No new or progressive findings. 4. Stable diffuse sclerotic metastatic bone  disease. No findings for progression. 5. Interval decrease in size of the pancreatic mass and adjacent lymph node. 6. Slightly smaller segment 7 liver lesion. No new or progressive findings. 7. Emphysema and aortic atherosclerosis. Aortic Atherosclerosis (ICD10-I70.0) and Emphysema (ICD10-J43.9). Electronically Signed   By: Marijo Sanes M.D.   On: 01/02/2021 12:28   US Abdomen Limited RUQ (LIVER/GB)  Result Date: 01/15/2021 CLINICAL DATA:  Elevated LFTs EXAM: ULTRASOUND ABDOMEN LIMITED RIGHT UPPER QUADRANT COMPARISON:  None. FINDINGS: Gallbladder: No gallstones or wall thickening visualized. No sonographic Murphy sign noted by sonographer. Common bile duct: Diameter: Normal at 1.5 mm Liver: Small subtly hypoechoic solid lesion in the posterosuperior aspect of the right liver measures 1.2 x 1.1 cm. Hepatic parenchymal echogenicity and echotexture within normal limits. Portal vein is patent on color Doppler imaging with normal direction of blood flow towards the liver. Other: None. IMPRESSION: 1. Small nonspecific solid 1.2 cm hypoechoic lesion in the posterosuperior aspect of the right liver. Differential considerations are broad and include both malignant and benign neoplastic processes. Recommend further evaluation with gadolinium-enhanced MRI of the abdomen. 2. Otherwise, unremarkable right upper quadrant ultrasound. Electronically Signed   By: Jacqulynn Cadet M.D.   On: 01/15/2021 06:37    ASSESSMENT AND PLAN: 1.  Extensive stage small cell lung cancer 2.  Pancytopenia secondary to chemotherapy 3.  Dysphagia, odynophagia, secondary to radiation induced severe esophageal stricture  4.  Transaminitis and hyperbilirubinemia, improved  5.  Failure of thrive 6.  Hypothyroidism 7.  History of breast cancer 8. DNR   -Neutropenia is stable, ANC 0.7 today.  Thrombocytopenia is slowly improving, platelets 79K today.  Hemoglobin slowly trending down, 7.9 today, consider blood transfusion if less than  7.5. -I spoke with Dr. Neldon Labella this morning.  Appreciated Dr. Watt Climes seen patient, he offered esophageal stent placement.  I explained that to patient and her daughter on the phone, they would like to proceed.  Hopefully will be done on Monday. -Continue liquid diet, I encourage patient to get out of bed as much as she can. -Okay to give Granix over the weekend if [place] trending down, if she will have procedure on Monday. -will f/u   [name]  01/25/2021

## 2021-01-26 DIAGNOSIS — E440 Moderate protein-calorie malnutrition: Secondary | ICD-10-CM | POA: Diagnosis not present

## 2021-01-26 DIAGNOSIS — Z853 Personal history of malignant neoplasm of breast: Secondary | ICD-10-CM | POA: Diagnosis not present

## 2021-01-26 DIAGNOSIS — R1310 Dysphagia, unspecified: Secondary | ICD-10-CM | POA: Diagnosis not present

## 2021-01-26 DIAGNOSIS — Z95828 Presence of other vascular implants and grafts: Secondary | ICD-10-CM | POA: Diagnosis not present

## 2021-01-26 LAB — BASIC METABOLIC PANEL
Anion gap: 10 (ref 5–15)
BUN: 6 mg/dL — ABNORMAL LOW (ref 8–23)
CO2: 19 mmol/L — ABNORMAL LOW (ref 22–32)
Calcium: 7.9 mg/dL — ABNORMAL LOW (ref 8.9–10.3)
Chloride: 105 mmol/L (ref 98–111)
Creatinine, Ser: 0.57 mg/dL (ref 0.44–1.00)
GFR, Estimated: >60 mL/min (ref >60)
Glucose, Bld: 100 mg/dL — ABNORMAL HIGH (ref 70–99)
Potassium: 3 mmol/L — ABNORMAL LOW (ref 3.5–5.1)
Sodium: 134 mmol/L — ABNORMAL LOW (ref 135–145)

## 2021-01-26 LAB — CBC WITH DIFFERENTIAL/PLATELET
Abs Immature Granulocytes: 0.05 10*3/uL (ref 0.00–0.07)
Basophils Absolute: 0 10*3/uL (ref 0.0–0.1)
Basophils Relative: 1 %
Eosinophils Absolute: 0 10*3/uL (ref 0.0–0.5)
Eosinophils Relative: 0 %
HCT: 23.9 % — ABNORMAL LOW (ref 36.0–46.0)
Hemoglobin: 8 g/dL — ABNORMAL LOW (ref 12.0–15.0)
Immature Granulocytes: 4 %
Lymphocytes Relative: 14 %
Lymphs Abs: 0.2 10*3/uL — ABNORMAL LOW (ref 0.7–4.0)
MCH: 27.5 pg (ref 26.0–34.0)
MCHC: 33.5 g/dL (ref 30.0–36.0)
MCV: 82.1 fL (ref 80.0–100.0)
Monocytes Absolute: 0.4 10*3/uL (ref 0.1–1.0)
Monocytes Relative: 27 %
Neutro Abs: 0.8 10*3/uL — ABNORMAL LOW (ref 1.7–7.7)
Neutrophils Relative %: 54 %
Platelets: 96 10*3/uL — ABNORMAL LOW (ref 150–400)
RBC: 2.91 MIL/uL — ABNORMAL LOW (ref 3.87–5.11)
RDW: 15.6 % — ABNORMAL HIGH (ref 11.5–15.5)
WBC: 1.4 10*3/uL — CL (ref 4.0–10.5)
nRBC: 1.4 % — ABNORMAL HIGH (ref 0.0–0.2)

## 2021-01-26 MED ORDER — MAGNESIUM SULFATE 2 GM/50ML IV SOLN
2.0000 g | Freq: Once | INTRAVENOUS | Status: AC
  Administered 2021-01-26: 2 g via INTRAVENOUS
  Filled 2021-01-26: qty 50

## 2021-01-26 MED ORDER — POTASSIUM CHLORIDE 10 MEQ/50ML IV SOLN: 10.0000 meq | INTRAVENOUS | Status: DC

## 2021-01-26 MED ORDER — POTASSIUM CHLORIDE 10 MEQ/50ML IV SOLN
10.0000 meq | INTRAVENOUS | Status: AC
  Administered 2021-01-26 (×4): 10 meq via INTRAVENOUS
  Filled 2021-01-26 (×4): qty 50

## 2021-01-26 NOTE — Progress Notes (Signed)
PROGRESS NOTE  Tara Rodgers  DOB: 01/30/1946  PCP: Elby Showers, MD WGY:[id number]  DOA: 01/19/2021  LOS: 11 days   Chief Complaint  Patient presents with  . Emesis  . Nausea  . Dysphagia    Brief narrative: Tara Rodgers is a 75 y.o. female with PMH significant for metastatic SCLC undergoing chemotherapy s/p XRT, transfusion-dependent anemia, thrombocytopenia, right breast CA s/p mastectomy 2016 on antiestrogen therapy. Patient presented to the ED on 01/31/2021 with complaint of 3 weeks of constant, worsening, severe dysphagia, globus sensation, and odynophagia that has limited per oral intake severely to the point that she was not able to tolerate any po and not able to take her medications.  In the ED, she appeared dehydrated, tachycardic to 130s, afebrile, normotensive, benign abdominal exam Labs with ketones in urine, potassium low at 2.9, metabolic acidosis, LFTs elevated.   Patient was admitted to hospitalist service See below for details  01/26/2021: Patient seen.  No new changes.  Awaiting esophageal stenting on Monday.  Subjective: Patient seen. No chest pain. No shortness of breath.          Assessment/Plan: Dysphagia, odynophagia -Initially planned for EGD but could not be done because of pancytopenia.   -Finally underwent EGD on 4/13.  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  -GI recommends continuation of IV Protonix Carafate, clear liquid diet.  Pending cytology brushing.  Candida and herpes.  Continue Diflucan in the meantime. -4/14, noted a plan for esophageal stenting on Monday. 01/26/2021: For esophageal stenting on Monday.  Pancytopenia -CBC being monitored.  Labs as below.  -Platelet count significantly improved, 79,000 today -ANC still remains low, hemoglobin at 7.9.  Per oncology, to transfuse if less than 7.5 Recent Labs  Lab  02/04/2021 0534 01/20/2021 1307 02/02/2021 1507 01/21/2021 0610 01/25/21 0512 01/26/21 0528  WBC 2.1*  --  2.3* 1.5* 1.4* 1.4*  NEUTROABS 1.0*  --  1.1* 0.7* 0.7* 0.8*  HGB 8.8* 8.5* 8.7* 8.3* 7.9* 8.0*  HCT 26.5* 25.0* 26.0* 25.1* 24.0* 23.9*  MCV 82.6  --  81.8 82.8 83.0 82.1  PLT 21*  --  81* 65* 79* 96*   Moderate protein calorie malnutrition -Supplement protein as much as able  Hypokalemia/hypomagnesemia -Potassium low at 3, magnesium low at 1.5 today.  IV replacement given.  Repeat tomorrow.   01/26/21: Potassium is 3 today. Will continue to replete. Will also administer IV magnesium 2 gm X 1 dose.  Recent Labs  Lab 01/21/21 0623 01/22/21 0618 01/26/2021 0534 02/04/2021 0816 01/19/2021 1200 01/31/2021 1307 01/24/2021 1601 01/16/2021 0610 01/15/2021 0726 01/25/21 0512 01/26/21 0528  K 3.3* 3.2*   < >  --    < > 2.6* 2.7*  --  3.2* 3.0* 3.0*  MG 1.3* 1.9  --  1.5*  --   --   --  1.9  --  1.5*  --   PHOS 3.3  --   --   --   --   --   --   --   --  3.2  --    < > = values in this interval not displayed.   Stage IV small cell lung cancer -Seen on PET 1/28 started on etoposide, carboplatin and atezolizumab 11/22/2020, s/p palliative mediastinal XRT 1/31 - 2/15.  -Oncology, Dr. [name] following  Solid 1.2 cm hypoechoic lesion in the posterosuperior aspect of the right liver: Nonspecific.  -Defer to  oncology   HTN, tachycardia -Continues to have tachycardia probably related to dehydration.  IV metoprolol, hopefully could change back to po formulation of medications soon.  Hypothyroidism -Continue synthroid to IV for now. Has had good response following TSH.  History of breast CA -On antiestrogen which we will hold while unable to take po.  History of covid-19 infection and s/p 2 doses of vaccine -Recommended 3rd dose.  Impaired mobility -PT eval obtained. SNF recommended.  But patient wants to go home ultimately.  Mobility: PT eval obtained Code Status:   Code Status: DNR   Nutritional status: Body mass index is 22.04 kg/m. Nutrition Problem: Moderate Malnutrition Etiology: chronic illness,cancer and cancer related treatments Signs/Symptoms: energy intake < or equal to 75% for > or equal to 1 month,percent weight loss,mild fat depletion,mild muscle depletion Diet Order            DIET SOFT Room service appropriate? Yes; Fluid consistency: Thin  Diet effective now                 DVT prophylaxis: SCDs Start: 02/09/2021 1939   Antimicrobials:  IV fluconazole Fluid: NS with potassium at 100 mill per hour Consultants: GI, oncology Family Communication:  None at bedside  Status is: Inpatient  Remains inpatient appropriate because: Continues to have dysphagia, pancytopenia  Dispo: The patient is from: Home              Anticipated d/c is to: Home with home health PT 1 to 2 days              Patient currently is not medically stable to d/c.   Difficult to place patient No   Infusions:  . 0.9 % NaCl with KCl 20 mEq / L 100 mL/hr at 01/26/21 1443  . fluconazole (DIFLUCAN) IV 200 mg (01/26/21 1015)  . magnesium sulfate bolus IVPB 2 g (01/26/21 1441)  . potassium chloride 10 mEq (01/26/21 1448)    Scheduled Meds: . Chlorhexidine Gluconate Cloth  6 each Topical Daily  . feeding supplement  237 mL Oral TID BM  . fentaNYL  1 patch Transdermal Q72H  . levothyroxine  50 mcg Intravenous Daily  . mouth rinse  15 mL Mouth Rinse BID  . metoprolol tartrate  5 mg Intravenous Q6H  . nystatin  5 mL Oral QID  . pantoprazole (PROTONIX) IV  40 mg Intravenous Q12H  . sodium chloride flush  10-40 mL Intracatheter Q12H  . sodium chloride flush  3 mL Intravenous Q12H  . sucralfate  1 g Oral TID WC & HS    Antimicrobials: Anti-infectives (From admission, onward)   Start     Dose/Rate Route Frequency Ordered Stop   01/16/21 1000  fluconazole (DIFLUCAN) IVPB 200 mg        200 mg 100 mL/hr over 60 Minutes Intravenous Daily 01/15/21 1024     01/15/21 1300   cefTRIAXone (ROCEPHIN) 1 g in sodium chloride 0.9 % 100 mL IVPB  Status:  Discontinued        1 g 200 mL/hr over 30 Minutes Intravenous Every 24 hours 01/15/21 1210 01/21/21 1210   01/15/21 1100  fluconazole (DIFLUCAN) IVPB 400 mg        400 mg 100 mL/hr over 120 Minutes Intravenous  Once 01/15/21 1024 01/15/21 1326      PRN meds: acetaminophen **OR** [DISCONTINUED] acetaminophen, LORazepam, magic mouthwash w/lidocaine, ondansetron (ZOFRAN) IV, sodium chloride flush   Objective: Vitals:   01/26/21 0529 01/26/21 1300  BP:  Marland Kitchen)  154/89  Pulse: 94 98  Resp:  16  Temp:  97.8 F (36.6 C)  SpO2:  100%    Intake/Output Summary (Last 24 hours) at 01/26/2021 1522 Last data filed at 01/26/2021 1100 Gross per 24 hour  Intake 280 ml  Output 2400 ml  Net -2120 ml   Filed Weights   01/16/21 0816 01/25/21 0530 01/26/21 0700  Weight: 51 kg 49.8 kg 49.5 kg   Weight change: -0.3 kg Body mass index is 22.04 kg/m.   Physical Exam: General exam: Pleasant elderly Caucasian female.   HEENT: Patient is pale. No jaundice.  Lungs: Clear to auscultation bilaterally CVS: S1S2 GI/Abd soft, nontender, nondistended, bowel sound present CNS: Alert, awake, oriented x3. Patient moves all extremities. Extremities: No pedal edema, no calf tenderness  Data Review: I have personally reviewed the laboratory data and studies available.  Recent Labs  Lab 01/31/2021 0534 01/28/2021 1307 01/28/2021 1507 01/18/2021 0610 01/25/21 0512 01/26/21 0528  WBC 2.1*  --  2.3* 1.5* 1.4* 1.4*  NEUTROABS 1.0*  --  1.1* 0.7* 0.7* 0.8*  HGB 8.8* 8.5* 8.7* 8.3* 7.9* 8.0*  HCT 26.5* 25.0* 26.0* 25.1* 24.0* 23.9*  MCV 82.6  --  81.8 82.8 83.0 82.1  PLT 21*  --  81* 65* 79* 96*   Recent Labs  Lab 01/21/21 0623 01/22/21 0618 01/13/2021 0534 01/15/2021 0816 02/07/2021 1200 02/10/2021 1307 01/15/2021 1601 02/05/2021 0610 01/12/2021 0726 01/25/21 0512 01/26/21 0528  NA 134* 134*   < >  --  136 138 138  --  135 134* 134*  K 3.3*  3.2*   < >  --  2.5* 2.6* 2.7*  --  3.2* 3.0* 3.0*  CL 103 107   < >  --  106 105 108  --  108 105 105  CO2 19* 18*   < >  --  17*  --  19*  --  19* 19* 19*  GLUCOSE 96 104*   < >  --  98 99 117*  --  106* 102* 100*  BUN 8 8   < >  --  9 6* 8  --  8 7* 6*  CREATININE 0.47 0.51   < >  --  0.56 0.40* 0.55  --  0.52 0.51 0.57  CALCIUM 7.9* 7.8*   < >  --  8.0*  --  8.2*  --  7.9* 8.0* 7.9*  MG 1.3* 1.9  --  1.5*  --   --   --  1.9  --  1.5*  --   PHOS 3.3  --   --   --   --   --   --   --   --  3.2  --    < > = values in this interval not displayed.    F/u labs ordered Unresulted Labs (From admission, onward)          Start     Ordered   01/27/21 0500  Renal function panel  Tomorrow morning,   R       Question:  Specimen collection method  Answer:  Unit=Unit collect   01/26/21 1354   01/27/21 0500  Magnesium  Tomorrow morning,   R       Question:  Specimen collection method  Answer:  Unit=Unit collect   01/26/21 1354   01/25/21 7408  Basic metabolic panel  Daily,   R     Question:  Specimen collection method  Answer:  Unit=Unit collect  02/06/2021 1612          Signed, Bonnell Public, MD [facility] 01/26/2021

## 2021-01-26 NOTE — Progress Notes (Signed)
Physical Therapy Treatment Patient Details Name: Tara Rodgers MRN: [id number] DOB: 09/30/1946 Today's Date: 01/26/2021    History of Present Illness Pt is 75 y.o. female admitted with 3 weeks of constant, worsening, severe dysphagia, globus sensation, and odynophagia that has limited per oral intake severely to the point that she isn't able to tolerate any po and not able to take her medications. Admitted on 02/01/2021 with Admitted with dysphagia, odynophagia, pancytopenia, and hypokalemia. PMH of metastatic SCLC undergoing chemotherapy s/p XRT, transfusion-dependent anemia, thrombocytopenia, right breast CA s/p mastectomy 2016.    PT Comments    Patient agreeable to work in room with therapy today but limited by fatigue. Extra time required to initiate supine to sit and after ~ 5 minutes sitting EOB pt c/o dizziness. Min assist needed to return to supine to raise LE's up into bed. Symptoms resolved and patient agreeable to bed level exercises for LE strengthening; extended rest breaks provided between sets/reps. Pt's family in room at [place]. Will continue to progress as able in acute setting, continue to recommend HHPT follow up.    Follow Up Recommendations  Home health PT;Supervision for mobility/OOB     Equipment Recommendations  None recommended by PT    Recommendations for Other Services       Precautions / Restrictions Precautions Precautions: Fall Restrictions Weight Bearing Restrictions: No    Mobility  Bed Mobility Overal bed mobility: Needs Assistance Bed Mobility: Supine to Sit;Sit to Supine     Supine to sit: Supervision;HOB elevated Sit to supine: HOB elevated;Min assist   General bed mobility comments: pt taking extra time to initiate mobilizing to EOB and required use of bed rail and elevated HOB. min assist at EOB to bring LE's onto bed due to pt c/o weakness, fatigue, and dizzy sensation.    Transfers                 General transfer comment: pt  initially agreeable but became dizzy sitting EOB and returned to supine.  Ambulation/Gait                 Stairs             Wheelchair Mobility    Modified Rankin (Stroke Patients Only)       Balance Overall balance assessment: Needs assistance Sitting-balance support: Bilateral upper extremity supported;Feet supported Sitting balance-Leahy Scale: Fair Sitting balance - Comments: slouched posture today                                    Cognition Arousal/Alertness: Awake/alert Behavior During Therapy: Flat affect Overall Cognitive Status: Within Functional Limits for tasks assessed                                 General Comments: pt generally tired and feeling weak today but agreeable to work in room      Exercises General Exercises - Lower Extremity Ankle Circles/Pumps: AROM;Both;10 reps;Supine Gluteal Sets: AROM;Both;Supine;5 reps Hip Flexion/Marching: AROM;Supine;10 reps;Both    General Comments        Pertinent Vitals/Pain Pain Assessment: No/denies pain    Home Living                      Prior Function            PT Goals (current goals can now  be found in the care plan section) Acute Rehab PT Goals Patient Stated Goal: be able to walk PT Goal Formulation: With patient Time For Goal Achievement: 01/31/21 Potential to Achieve Goals: Fair Progress towards PT goals: Progressing toward goals (slow to progress)    Frequency    Min 3X/week      PT Plan Discharge plan needs to be updated;Current plan remains appropriate    Co-evaluation              AM-PAC PT "6 Clicks" Mobility   Outcome Measure  Help needed turning from your back to your side while in a flat bed without using bedrails?: A Little Help needed moving from lying on your back to sitting on the side of a flat bed without using bedrails?: A Little Help needed moving to and from a bed to a chair (including a wheelchair)?: A  Little Help needed standing up from a chair using your arms (e.g., wheelchair or bedside chair)?: A Little Help needed to walk in hospital room?: A Lot Help needed climbing 3-5 steps with a railing? : Total 6 Click Score: 15    End of Session Equipment Utilized During Treatment: Gait belt Activity Tolerance: Patient tolerated treatment well Patient left: with call bell/phone within reach;in bed;with bed alarm set Nurse Communication: Mobility status PT Visit Diagnosis: Difficulty in walking, not elsewhere classified (R26.2);Muscle weakness (generalized) (M62.81)     Time: 5697-9480 PT Time Calculation (min) (ACUTE ONLY): 23 min  Charges:  $Therapeutic Exercise: 8-22 mins $Therapeutic Activity: 8-22 mins                     Verner Mould, DPT Acute Rehabilitation Services Office [phone] Pager [phone]     Jacques Navy 01/26/2021, 5:46 PM

## 2021-01-26 NOTE — Progress Notes (Addendum)
Tara Rodgers 11:57 AM  Subjective: Patient doing well without any new complaints and we rediscussed esophageal stent and she is in favor of proceeding and she is swallowing liquids okay  Objective: Vital signs stable afebrile no acute distress not examined today will be prior to her procedure currently counts are adequate to proceed  Assessment: Radiation stricture and pancytopenia  Plan: I have tentatively scheduled her for esophageal stenting on Monday midday and would ask hematology to assist with pancytopenia Monday morning if any question or problem and please call me this weekend if I can be of any further assistance with this hospital stay  [facility]  office [phone] After 5PM or if no answer call [phone]

## 2021-01-27 DIAGNOSIS — R1310 Dysphagia, unspecified: Secondary | ICD-10-CM | POA: Diagnosis not present

## 2021-01-27 DIAGNOSIS — Z95828 Presence of other vascular implants and grafts: Secondary | ICD-10-CM | POA: Diagnosis not present

## 2021-01-27 DIAGNOSIS — Z853 Personal history of malignant neoplasm of breast: Secondary | ICD-10-CM | POA: Diagnosis not present

## 2021-01-27 DIAGNOSIS — E440 Moderate protein-calorie malnutrition: Secondary | ICD-10-CM | POA: Diagnosis not present

## 2021-01-27 LAB — BASIC METABOLIC PANEL
Anion gap: 12 (ref 5–15)
BUN: 9 mg/dL (ref 8–23)
CO2: 20 mmol/L — ABNORMAL LOW (ref 22–32)
Calcium: 8.7 mg/dL — ABNORMAL LOW (ref 8.9–10.3)
Chloride: 106 mmol/L (ref 98–111)
Creatinine, Ser: 0.62 mg/dL (ref 0.44–1.00)
GFR, Estimated: >60 mL/min (ref >60)
Glucose, Bld: 121 mg/dL — ABNORMAL HIGH (ref 70–99)
Potassium: 3 mmol/L — ABNORMAL LOW (ref 3.5–5.1)
Sodium: 138 mmol/L (ref 135–145)

## 2021-01-27 LAB — RENAL FUNCTION PANEL
Albumin: 3.3 g/dL — ABNORMAL LOW (ref 3.5–5.0)
Anion gap: 13 (ref 5–15)
BUN: 9 mg/dL (ref 8–23)
CO2: 19 mmol/L — ABNORMAL LOW (ref 22–32)
Calcium: 8.6 mg/dL — ABNORMAL LOW (ref 8.9–10.3)
Chloride: 107 mmol/L (ref 98–111)
Creatinine, Ser: 0.62 mg/dL (ref 0.44–1.00)
GFR, Estimated: >60 mL/min (ref >60)
Glucose, Bld: 119 mg/dL — ABNORMAL HIGH (ref 70–99)
Phosphorus: 3.8 mg/dL (ref 2.5–4.6)
Potassium: 3 mmol/L — ABNORMAL LOW (ref 3.5–5.1)
Sodium: 139 mmol/L (ref 135–145)

## 2021-01-27 LAB — MAGNESIUM: Magnesium: 2 mg/dL (ref 1.7–2.4)

## 2021-01-27 MED ORDER — ENALAPRILAT 1.25 MG/ML IV SOLN
0.6250 mg | Freq: Once | INTRAVENOUS | Status: AC
  Administered 2021-01-27: 0.625 mg via INTRAVENOUS
  Filled 2021-01-27: qty 0.5

## 2021-01-27 NOTE — Progress Notes (Signed)
   01/27/21 1950  Assess: MEWS Score  Temp 98.6 F (37 C)  BP (!) 142/87  Pulse Rate (!) 117  Resp 18  SpO2 99 %  O2 Device Room Air  Assess: MEWS Score  MEWS Temp 0  MEWS Systolic 0  MEWS Pulse 2  MEWS RR 0  MEWS LOC 0  MEWS Score 2  MEWS Score Color Yellow  Assess: if the MEWS score is Yellow or Red  Were vital signs taken at a resting state? Yes  Focused Assessment No change from prior assessment  Early Detection of Sepsis Score *See Row Information* Low  MEWS guidelines implemented *See Row Information* Yes  Treat  MEWS Interventions Escalated (See documentation below)  Take Vital Signs  Increase Vital Sign Frequency  Yellow: Q 2hr X 2 then Q 4hr X 2, if remains yellow, continue Q 4hrs  Escalate  MEWS: Escalate Yellow: discuss with charge nurse/RN and consider discussing with provider and RRT  Notify: Charge Nurse/RN  Name of Charge Nurse/RN Notified Jabarri Stefanelli, RN  Date Charge Nurse/RN Notified 01/27/21  Time Charge Nurse/RN Notified 2000  Document  Patient Outcome Other (Comment) (Pt stable on unit will cont with MEWS protocol)

## 2021-01-27 NOTE — Progress Notes (Signed)
PROGRESS NOTE  Tara Rodgers  DOB: March 20, 1946  PCP: Elby Showers, MD WGN:[id number]  DOA: 01/12/2021  LOS: 12 days   Chief Complaint  Patient presents with  . Emesis  . Nausea  . Dysphagia    Brief narrative: Tara Rodgers is a 75 y.o. female with PMH significant for metastatic SCLC undergoing chemotherapy s/p XRT, transfusion-dependent anemia, thrombocytopenia, right breast CA s/p mastectomy 2016 on antiestrogen therapy. Patient presented to the ED on 01/19/2021 with complaint of 3 weeks of constant, worsening, severe dysphagia, globus sensation, and odynophagia that has limited per oral intake severely to the point that she was not able to tolerate any po and not able to take her medications.  In the ED, she appeared dehydrated, tachycardic to 130s, afebrile, normotensive, benign abdominal exam Labs with ketones in urine, potassium low at 2.9, metabolic acidosis, LFTs elevated.   Patient was admitted to hospitalist service See below for details  01/26/2021: Patient seen.  No new changes.  Awaiting esophageal stenting on Monday. 01/27/2021: Patient seen alongside patient's husband.  No new GI complaints.  For esophageal stenting on Monday.  Subjective: Patient seen. No chest pain. No shortness of breath.          Assessment/Plan: Dysphagia, odynophagia -Initially planned for EGD but could not be done because of pancytopenia.   -Finally underwent EGD on 4/13.  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  -GI recommends continuation of IV Protonix Carafate, clear liquid diet.  Pending cytology brushing.  Candida and herpes.  Continue Diflucan in the meantime. -4/14, noted a plan for esophageal stenting on Monday. 01/26/2021: For esophageal stenting on Monday.  Pancytopenia -CBC being monitored.  Labs as below.  -Platelet count significantly improved, 79,000  today -ANC still remains low, hemoglobin at 7.9.  Per oncology, to transfuse if less than 7.5 Recent Labs  Lab 01/22/2021 0534 02/10/2021 1307 01/22/2021 1507 01/20/2021 0610 01/25/21 0512 01/26/21 0528  WBC 2.1*  --  2.3* 1.5* 1.4* 1.4*  NEUTROABS 1.0*  --  1.1* 0.7* 0.7* 0.8*  HGB 8.8* 8.5* 8.7* 8.3* 7.9* 8.0*  HCT 26.5* 25.0* 26.0* 25.1* 24.0* 23.9*  MCV 82.6  --  81.8 82.8 83.0 82.1  PLT 21*  --  81* 65* 79* 96*   Moderate protein calorie malnutrition -Supplement protein as much as able  Hypokalemia/hypomagnesemia -Potassium low at 3, magnesium low at 1.5 today.  IV replacement given.  Repeat tomorrow.   01/26/21: Potassium is 3 today. Will continue to replete. Will also administer IV magnesium 2 gm X 1 dose.  Recent Labs  Lab 01/21/21 0623 01/22/21 0618 01/13/2021 0534 01/28/2021 0816 02/01/2021 1200 02/03/2021 1601 01/26/2021 0610 01/16/2021 0726 01/25/21 0512 01/26/21 0528 01/27/21 0534  K 3.3* 3.2*   < >  --    < > 2.7*  --  3.2* 3.0* 3.0* 3.0*  3.0*  MG 1.3* 1.9  --  1.5*  --   --  1.9  --  1.5*  --  2.0  PHOS 3.3  --   --   --   --   --   --   --  3.2  --  3.8   < > = values in this interval not displayed.   Stage IV small cell lung cancer -Seen on PET 1/28 started on etoposide, carboplatin and atezolizumab 11/22/2020, s/p palliative mediastinal XRT 1/31 - 2/15.  -Oncology, Dr. [name] following  Solid 1.2  cm hypoechoic lesion in the posterosuperior aspect of the right liver: Nonspecific.  -Defer to oncology   HTN, tachycardia -Continues to have tachycardia probably related to dehydration.  IV metoprolol, hopefully could change back to po formulation of medications soon.  Hypothyroidism -Continue synthroid to IV for now. Has had good response following TSH.  History of breast CA -On antiestrogen which we will hold while unable to take po.  History of covid-19 infection and s/p 2 doses of vaccine -Recommended 3rd dose.  Impaired mobility -PT eval obtained. SNF  recommended.  But patient wants to go home ultimately.  Mobility: PT eval obtained Code Status:   Code Status: DNR  Nutritional status: Body mass index is 21.68 kg/m. Nutrition Problem: Moderate Malnutrition Etiology: chronic illness,cancer and cancer related treatments Signs/Symptoms: energy intake < or equal to 75% for > or equal to 1 month,percent weight loss,mild fat depletion,mild muscle depletion Diet Order            DIET SOFT Room service appropriate? Yes; Fluid consistency: Thin  Diet effective now                 DVT prophylaxis: SCDs Start: 01/26/2021 1939   Antimicrobials:  IV fluconazole Fluid: NS with potassium at 100 mill per hour Consultants: GI, oncology Family Communication:  None at bedside  Status is: Inpatient  Remains inpatient appropriate because: Continues to have dysphagia, pancytopenia  Dispo: The patient is from: Home              Anticipated d/c is to: Home with home health PT 1 to 2 days              Patient currently is not medically stable to d/c.   Difficult to place patient No   Infusions:  . 0.9 % NaCl with KCl 20 mEq / L 100 mL/hr at 01/27/21 0728  . fluconazole (DIFLUCAN) IV 200 mg (01/27/21 0947)    Scheduled Meds: . Chlorhexidine Gluconate Cloth  6 each Topical Daily  . feeding supplement  237 mL Oral TID BM  . fentaNYL  1 patch Transdermal Q72H  . levothyroxine  50 mcg Intravenous Daily  . mouth rinse  15 mL Mouth Rinse BID  . metoprolol tartrate  5 mg Intravenous Q6H  . nystatin  5 mL Oral QID  . pantoprazole (PROTONIX) IV  40 mg Intravenous Q12H  . sodium chloride flush  10-40 mL Intracatheter Q12H  . sodium chloride flush  3 mL Intravenous Q12H  . sucralfate  1 g Oral TID WC & HS    Antimicrobials: Anti-infectives (From admission, onward)   Start     Dose/Rate Route Frequency Ordered Stop   01/16/21 1000  fluconazole (DIFLUCAN) IVPB 200 mg        200 mg 100 mL/hr over 60 Minutes Intravenous Daily 01/15/21 1024      01/15/21 1300  cefTRIAXone (ROCEPHIN) 1 g in sodium chloride 0.9 % 100 mL IVPB  Status:  Discontinued        1 g 200 mL/hr over 30 Minutes Intravenous Every 24 hours 01/15/21 1210 01/21/21 1210   01/15/21 1100  fluconazole (DIFLUCAN) IVPB 400 mg        400 mg 100 mL/hr over 120 Minutes Intravenous  Once 01/15/21 1024 01/15/21 1326      PRN meds: acetaminophen **OR** [DISCONTINUED] acetaminophen, LORazepam, magic mouthwash w/lidocaine, ondansetron (ZOFRAN) IV, sodium chloride flush   Objective: Vitals:   01/27/21 0937 01/27/21 1348  BP: (!) 151/87 (!) 142/83  Pulse: (!) 119 (!) 114  Resp: 18 18  Temp: 97.9 F (36.6 C) 97.7 F (36.5 C)  SpO2: 100% 100%    Intake/Output Summary (Last 24 hours) at 01/27/2021 1602 Last data filed at 01/27/2021 1350 Gross per 24 hour  Intake 3771.86 ml  Output 2900 ml  Net 871.86 ml   Filed Weights   01/25/21 0530 01/26/21 0700 01/27/21 0500  Weight: 49.8 kg 49.5 kg 48.7 kg   Weight change: -0.8 kg Body mass index is 21.68 kg/m.   Physical Exam: General exam: Pleasant elderly Caucasian female.   HEENT: Patient is pale. No jaundice.  Lungs: Clear to auscultation bilaterally CVS: S1S2 GI/Abd soft, nontender, nondistended, bowel sound present CNS: Alert, awake, oriented x3. Patient moves all extremities. Extremities: No pedal edema, no calf tenderness  Data Review: I have personally reviewed the laboratory data and studies available.  Recent Labs  Lab 01/17/2021 0534 01/28/2021 1307 02/10/2021 1507 01/12/2021 0610 01/25/21 0512 01/26/21 0528  WBC 2.1*  --  2.3* 1.5* 1.4* 1.4*  NEUTROABS 1.0*  --  1.1* 0.7* 0.7* 0.8*  HGB 8.8* 8.5* 8.7* 8.3* 7.9* 8.0*  HCT 26.5* 25.0* 26.0* 25.1* 24.0* 23.9*  MCV 82.6  --  81.8 82.8 83.0 82.1  PLT 21*  --  81* 65* 79* 96*   Recent Labs  Lab 01/21/21 0623 01/22/21 0618 01/22/2021 0534 01/25/2021 0816 01/13/2021 1200 01/30/2021 1601 02/06/2021 0610 01/19/2021 0726 01/25/21 0512 01/26/21 0528 01/27/21 0534   NA 134* 134*   < >  --    < > 138  --  135 134* 134* 139  138  K 3.3* 3.2*   < >  --    < > 2.7*  --  3.2* 3.0* 3.0* 3.0*  3.0*  CL 103 107   < >  --    < > 108  --  108 105 105 107  106  CO2 19* 18*   < >  --    < > 19*  --  19* 19* 19* 19*  20*  GLUCOSE 96 104*   < >  --    < > 117*  --  106* 102* 100* 119*  121*  BUN 8 8   < >  --    < > 8  --  8 7* 6* 9  9  CREATININE 0.47 0.51   < >  --    < > 0.55  --  0.52 0.51 0.57 0.62  0.62  CALCIUM 7.9* 7.8*   < >  --    < > 8.2*  --  7.9* 8.0* 7.9* 8.6*  8.7*  MG 1.3* 1.9  --  1.5*  --   --  1.9  --  1.5*  --  2.0  PHOS 3.3  --   --   --   --   --   --   --  3.2  --  3.8   < > = values in this interval not displayed.    F/u labs ordered Unresulted Labs (From admission, onward)         None      Signed, Bonnell Public, MD [facility] 01/27/2021

## 2021-01-28 ENCOUNTER — Inpatient Hospital Stay (HOSPITAL_COMMUNITY): Payer: Medicare Other

## 2021-01-28 DIAGNOSIS — E440 Moderate protein-calorie malnutrition: Secondary | ICD-10-CM | POA: Diagnosis not present

## 2021-01-28 DIAGNOSIS — Z853 Personal history of malignant neoplasm of breast: Secondary | ICD-10-CM | POA: Diagnosis not present

## 2021-01-28 DIAGNOSIS — D701 Agranulocytosis secondary to cancer chemotherapy: Secondary | ICD-10-CM | POA: Diagnosis not present

## 2021-01-28 DIAGNOSIS — R1310 Dysphagia, unspecified: Secondary | ICD-10-CM | POA: Diagnosis not present

## 2021-01-28 LAB — GLUCOSE, CAPILLARY
Glucose-Capillary: 117 mg/dL — ABNORMAL HIGH (ref 70–99)
Glucose-Capillary: 103 mg/dL — ABNORMAL HIGH (ref 70–99)

## 2021-01-28 MED ORDER — DOCUSATE SODIUM 100 MG PO CAPS
100.0000 mg | ORAL_CAPSULE | Freq: Two times a day (BID) | ORAL | Status: DC
  Administered 2021-01-29: 100 mg via ORAL
  Filled 2021-01-28 (×3): qty 1

## 2021-01-28 MED ORDER — POLYETHYLENE GLYCOL 3350 17 G PO PACK
17.0000 g | PACK | Freq: Every day | ORAL | Status: DC
  Filled 2021-01-28: qty 1

## 2021-01-28 MED ORDER — POTASSIUM CHLORIDE 10 MEQ/100ML IV SOLN
10.0000 meq | INTRAVENOUS | Status: AC
  Administered 2021-01-28 (×3): 10 meq via INTRAVENOUS
  Filled 2021-01-28 (×3): qty 100

## 2021-01-28 MED ORDER — POTASSIUM CHLORIDE 10 MEQ/100ML IV SOLN
10.0000 meq | INTRAVENOUS | Status: AC
  Administered 2021-01-28 (×4): 10 meq via INTRAVENOUS
  Filled 2021-01-28 (×3): qty 100

## 2021-01-28 MED ORDER — POTASSIUM CHLORIDE IN NACL 20-0.9 MEQ/L-% IV SOLN: INTRAVENOUS | Status: DC

## 2021-01-28 MED ORDER — SODIUM CHLORIDE 0.9 % IV SOLN: INTRAVENOUS | Status: DC

## 2021-01-28 MED ORDER — POTASSIUM CHLORIDE 10 MEQ/100ML IV SOLN
10.0000 meq | INTRAVENOUS | Status: DC
  Filled 2021-01-28: qty 100

## 2021-01-28 MED ORDER — POTASSIUM CHLORIDE IN NACL 40-0.9 MEQ/L-% IV SOLN
INTRAVENOUS | Status: DC
  Filled 2021-01-28 (×5): qty 1000

## 2021-01-28 MED ORDER — GADOBUTROL 1 MMOL/ML IV SOLN
4.0000 mL | Freq: Once | INTRAVENOUS | Status: AC | PRN
  Administered 2021-01-28: 4 mL via INTRAVENOUS

## 2021-01-28 NOTE — Progress Notes (Signed)
Family states that patient hasn't had a bowel movement since 4/3. Advised that per flowsheet patient had one 4/6 and 4/11. Patient is not sure, reached out to doctor. Orders placed for Miralax and Colace.

## 2021-01-28 NOTE — Progress Notes (Signed)
Patient K+3.0 this morning and received 3 runs of K+. Order later placed for 6 runs of K+. Reached out to Dr. Marthenia Rolling to verify orders. Orders changed to 4 runs of K+ per doctor.

## 2021-01-28 NOTE — Progress Notes (Signed)
   01/28/21 1356  Assess: MEWS Score  Temp 98 F (36.7 C)  BP (!) 153/83  Pulse Rate (!) 120  Resp 16  SpO2 100 %  O2 Device Room Air  Assess: MEWS Score  MEWS Temp 0  MEWS Systolic 0  MEWS Pulse 2  MEWS RR 0  MEWS LOC 0  MEWS Score 2  MEWS Score Color Yellow  Assess: if the MEWS score is Yellow or Red  Were vital signs taken at a resting state? Yes  Focused Assessment Change from prior assessment (see assessment flowsheet)  Early Detection of Sepsis Score *See Row Information* Low  MEWS guidelines implemented *See Row Information* Yes  Treat  MEWS Interventions Other (Comment);Escalated (See documentation below) (Pt been in & out of yellow mews d/t high pulse, dr notified)  Pain Scale 0-10  Pain Score 0  Take Vital Signs  Increase Vital Sign Frequency  Yellow: Q 2hr X 2 then Q 4hr X 2, if remains yellow, continue Q 4hrs  Escalate  MEWS: Escalate Yellow: discuss with charge nurse/RN and consider discussing with provider and RRT  Notify: Charge Nurse/RN  Name of Charge Nurse/RN Notified Desiree Lucy, RN  Date Charge Nurse/RN Notified 01/28/21  Time Charge Nurse/RN Notified 1400  Notify: Provider  Provider Name/Title Dana Allan, MD  Date Provider Notified 01/28/21  Time Provider Notified 1430  Notification Type Page  Notification Reason Other (Comment) (MEWS)  Document  Patient Outcome Other (Comment) (Stable on unit, yellow mews implemented )  Progress note created (see row info) Yes   Patient triggering yellow mews due to pulse of 120 and BP of 153/83. She has been in & out of yellow mews for the same reason. Agricultural consultant and doctor notified. Patient is stable, yellow MEWS implemented.

## 2021-01-28 NOTE — Progress Notes (Signed)
Notified by family that patient is feeling dizzy and lightheaded. Patient states this is the first time it has happened. Took blood sugar which was 117. Notified doctor, upon entering the room again patient states that the dizziness is gone. Advised patient to let RN know if it returns.

## 2021-01-28 NOTE — Progress Notes (Addendum)
Patient stating she is no longer dizzy and can see again. Patient able to correctly follow commands. Patient can correctly tell me her full name & DOB. BP and pulse is elevated, which is baseline for this patient.

## 2021-01-28 NOTE — Progress Notes (Signed)
Tara Rodgers 11:39 AM  Subjective: Patient without any new complaints tolerating clear liquids has not tried anything heavier ready for procedure tomorrow which we rediscussed  Objective: Vital signs stable afebrile no acute distress lungs are clear decreased heart sounds abdomen is soft nontender no new labs  Assessment: Radiation esophageal significant stricture  Plan: We will plan endoscopy tomorrow at noon with probable stent possible dilation and will ask medical team to correct potassium tomorrow morning as well as hematology to address pancytopenia if needed prior to procedure  [facility]  office [phone] After 5PM or if no answer call [phone]

## 2021-01-28 NOTE — Progress Notes (Addendum)
Patient had another "dizzy spell" and stated she was not able to see. Patient now has become increasingly confused, pulling at sheets and gown and calling out "mama." Patient only able to state first name and DOB correctly. Blood glucose 103. Rapid and NP notified.

## 2021-01-28 NOTE — Progress Notes (Signed)
PROGRESS NOTE  Tara Rodgers  DOB: [birth year]  PCP: Elby Showers, MD WHQ:[id number]  DOA: 01/28/2021  LOS: 13 days   Chief Complaint  Patient presents with  . Emesis  . Nausea  . Dysphagia    Brief narrative: Tara Rodgers is a 75 y.o. female with PMH significant for metastatic SCLC undergoing chemotherapy s/p XRT, transfusion-dependent anemia, thrombocytopenia, right breast CA s/p mastectomy 2016 on antiestrogen therapy. Patient presented to the ED on 01/28/2021 with complaint of 3 weeks of constant, worsening, severe dysphagia, globus sensation, and odynophagia that has limited her oral intake severely to the point that she was not able to tolerate any po and not able to take her medications.  In the ED, she appeared dehydrated, tachycardic to 130s, afebrile, normotensive, benign abdominal exam Labs with ketones in urine, potassium low at 2.9, metabolic acidosis, LFTs elevated.   Patient was admitted to hospitalist service See below for details  01/26/2021: Patient seen.  No new changes.  Awaiting esophageal stenting on Monday. 01/27/2021: Patient seen alongside patient's husband.  No new GI complaints.  For esophageal stenting on Monday. 01/28/2021: Patient seems more confused today.  Would not recognize her husband.  We will proceed with MRI of the brain with and without contrast.  Further management will depend on above.   Subjective: Patient seen. No chest pain. No shortness of breath.          Assessment/Plan: Dysphagia, odynophagia -Initially planned for EGD but could not be done because of pancytopenia.   -Finally underwent EGD on 4/13.  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  -GI recommends continuation of IV Protonix Carafate, clear liquid diet.  Pending cytology brushing.  Candida and herpes.  Continue Diflucan in the meantime. -4/14, noted a plan  for esophageal stenting on Monday. 01/26/2021: For esophageal stenting on Monday. 01/28/2021: For esophageal stent placement tomorrow.  Pancytopenia -CBC being monitored.  Labs as below.  -Platelet count significantly improved, 79,000 today -ANC still remains low, hemoglobin at 7.9.  Per oncology, to transfuse if less than 7.5 Recent Labs  Lab 01/27/2021 0534 02/04/2021 1307 01/17/2021 1507 01/13/2021 0610 01/25/21 0512 01/26/21 0528  WBC 2.1*  --  2.3* 1.5* 1.4* 1.4*  NEUTROABS 1.0*  --  1.1* 0.7* 0.7* 0.8*  HGB 8.8* 8.5* 8.7* 8.3* 7.9* 8.0*  HCT 26.5* 25.0* 26.0* 25.1* 24.0* 23.9*  MCV 82.6  --  81.8 82.8 83.0 82.1  PLT 21*  --  81* 65* 79* 96*   Moderate protein calorie malnutrition -Supplement protein as much as able  Hypokalemia/hypomagnesemia -Potassium low at 3, magnesium low at 1.5 today.  IV replacement given.  Repeat tomorrow.   01/26/21: Potassium is 3 today. Will continue to replete. Will also administer IV magnesium 2 gm X 1 dose. 01/28/2021: Continue to replete magnesium and potassium.  Potassium is 3 today.  Recent Labs  Lab 01/22/21 0618 01/25/2021 0534 02/09/2021 0816 01/17/2021 1200 02/01/2021 1601 01/28/2021 0610 01/12/2021 0726 01/25/21 0512 01/26/21 0528 01/27/21 0534  K 3.2*   < >  --    < > 2.7*  --  3.2* 3.0* 3.0* 3.0*  3.0*  MG 1.9  --  1.5*  --   --  1.9  --  1.5*  --  2.0  PHOS  --   --   --   --   --   --   --  3.2  --  3.8   < > = values in this interval not displayed.   Stage IV small cell lung cancer -Seen on PET 1/28 started on etoposide, carboplatin and atezolizumab 11/22/2020, s/p palliative mediastinal XRT 1/31 - 2/15.  -Oncology, Dr. [name] following 01/28/21: Encephalopathic today.  For MRI of the brain.  Solid 1.2 cm hypoechoic lesion in the posterosuperior aspect of the right liver: Nonspecific.  -Defer to oncology   HTN, tachycardia -Continues to have tachycardia probably related to dehydration.  IV metoprolol, hopefully could change back to  po formulation of medications soon.  Hypothyroidism -Continue synthroid to IV for now. Has had good response following TSH.  History of breast CA -On antiestrogen which we will hold while unable to take po.  History of covid-19 infection and s/p 2 doses of vaccine -Recommended 3rd dose.  Impaired mobility -PT eval obtained. SNF recommended.  But patient wants to go home ultimately.  Mobility: PT eval obtained Code Status:   Code Status: DNR  Nutritional status: Body mass index is 21.68 kg/m. Nutrition Problem: Moderate Malnutrition Etiology: chronic illness,cancer and cancer related treatments Signs/Symptoms: energy intake < or equal to 75% for > or equal to 1 month,percent weight loss,mild fat depletion,mild muscle depletion Diet Order            DIET SOFT Room service appropriate? Yes; Fluid consistency: Thin  Diet effective now                 DVT prophylaxis: SCDs Start: 02/07/2021 1939   Antimicrobials:  IV fluconazole Fluid: NS with potassium at 100 mill per hour Consultants: GI, oncology Family Communication:  None at bedside  Status is: Inpatient  Remains inpatient appropriate because: Continues to have dysphagia, pancytopenia  Dispo: The patient is from: Home              Anticipated d/c is to: Home with home health PT 1 to 2 days              Patient currently is not medically stable to d/c.   Difficult to place patient No   Infusions:  . 0.9 % NaCl with KCl 40 mEq / L Stopped (01/28/21 1447)  . fluconazole (DIFLUCAN) IV Stopped (01/28/21 1052)  . potassium chloride      Scheduled Meds: . Chlorhexidine Gluconate Cloth  6 each Topical Daily  . feeding supplement  237 mL Oral TID BM  . fentaNYL  1 patch Transdermal Q72H  . levothyroxine  50 mcg Intravenous Daily  . mouth rinse  15 mL Mouth Rinse BID  . metoprolol tartrate  5 mg Intravenous Q6H  . nystatin  5 mL Oral QID  . pantoprazole (PROTONIX) IV  40 mg Intravenous Q12H  . sodium chloride  flush  10-40 mL Intracatheter Q12H  . sodium chloride flush  3 mL Intravenous Q12H  . sucralfate  1 g Oral TID WC & HS    Antimicrobials: Anti-infectives (From admission, onward)   Start     Dose/Rate Route Frequency Ordered Stop   01/16/21 1000  fluconazole (DIFLUCAN) IVPB 200 mg        200 mg 100 mL/hr over 60 Minutes Intravenous Daily 01/15/21 1024     01/15/21 1300  cefTRIAXone (ROCEPHIN) 1 g in sodium chloride 0.9 % 100 mL IVPB  Status:  Discontinued        1 g 200 mL/hr over 30 Minutes Intravenous Every 24 hours 01/15/21 1210 01/21/21 1210   01/15/21 1100  fluconazole (DIFLUCAN) IVPB 400 mg  400 mg 100 mL/hr over 120 Minutes Intravenous  Once 01/15/21 1024 01/15/21 1326      PRN meds: acetaminophen **OR** [DISCONTINUED] acetaminophen, LORazepam, magic mouthwash w/lidocaine, ondansetron (ZOFRAN) IV, sodium chloride flush   Objective: Vitals:   01/28/21 0633 01/28/21 1356  BP: (!) 157/85 (!) 153/83  Pulse: (!) 110 (!) 120  Resp: 18 16  Temp: 98.9 F (37.2 C) 98 F (36.7 C)  SpO2: 100% 100%    Intake/Output Summary (Last 24 hours) at 01/28/2021 1615 Last data filed at 01/28/2021 1504 Gross per 24 hour  Intake 2058.73 ml  Output 1800 ml  Net 258.73 ml   Filed Weights   01/25/21 0530 01/26/21 0700 01/27/21 0500  Weight: 49.8 kg 49.5 kg 48.7 kg   Weight change:  Body mass index is 21.68 kg/m.   Physical Exam: General exam: Pleasant elderly Caucasian female.   HEENT: Patient is pale. No jaundice.  Lungs: Clear to auscultation bilaterally CVS: S1S2 GI/Abd soft, nontender, nondistended, bowel sound present CNS: Awake and alert, but could not recognize the husband. Extremities: No pedal edema, no calf tenderness  Data Review: I have personally reviewed the laboratory data and studies available.  Recent Labs  Lab 01/22/2021 0534 01/22/2021 1307 01/27/2021 1507 01/16/2021 0610 01/25/21 0512 01/26/21 0528  WBC 2.1*  --  2.3* 1.5* 1.4* 1.4*  NEUTROABS 1.0*   --  1.1* 0.7* 0.7* 0.8*  HGB 8.8* 8.5* 8.7* 8.3* 7.9* 8.0*  HCT 26.5* 25.0* 26.0* 25.1* 24.0* 23.9*  MCV 82.6  --  81.8 82.8 83.0 82.1  PLT 21*  --  81* 65* 79* 96*   Recent Labs  Lab 01/22/21 0618 01/13/2021 0534 01/19/2021 0816 01/13/2021 1200 02/04/2021 1601 02/06/2021 0610 02/07/2021 0726 01/25/21 0512 01/26/21 0528 01/27/21 0534  NA 134*   < >  --    < > 138  --  135 134* 134* 139  138  K 3.2*   < >  --    < > 2.7*  --  3.2* 3.0* 3.0* 3.0*  3.0*  CL 107   < >  --    < > 108  --  108 105 105 107  106  CO2 18*   < >  --    < > 19*  --  19* 19* 19* 19*  20*  GLUCOSE 104*   < >  --    < > 117*  --  106* 102* 100* 119*  121*  BUN 8   < >  --    < > 8  --  8 7* 6* 9  9  CREATININE 0.51   < >  --    < > 0.55  --  0.52 0.51 0.57 0.62  0.62  CALCIUM 7.8*   < >  --    < > 8.2*  --  7.9* 8.0* 7.9* 8.6*  8.7*  MG 1.9  --  1.5*  --   --  1.9  --  1.5*  --  2.0  PHOS  --   --   --   --   --   --   --  3.2  --  3.8   < > = values in this interval not displayed.    F/u labs ordered Unresulted Labs (From admission, onward)          Start     Ordered   02/10/2021 3903  Basic metabolic panel  Tomorrow morning,   R  Question:  Specimen collection method  Answer:  Unit=Unit collect   01/28/21 1138   01/22/2021 0500  CBC with Differential/Platelet  Tomorrow morning,   R       Question:  Specimen collection method  Answer:  Unit=Unit collect   01/28/21 1138   01/26/2021 0500  Renal function panel  Tomorrow morning,   R       Question:  Specimen collection method  Answer:  Unit=Unit collect   01/28/21 1615   02/09/2021 0500  Magnesium  Tomorrow morning,   R       Question:  Specimen collection method  Answer:  Unit=Unit collect   01/28/21 1615          Signed, Bonnell Public, MD [facility] 01/28/2021

## 2021-01-29 ENCOUNTER — Inpatient Hospital Stay (HOSPITAL_COMMUNITY): Payer: Medicare Other | Admitting: Certified Registered"

## 2021-01-29 ENCOUNTER — Inpatient Hospital Stay (HOSPITAL_COMMUNITY): Payer: Medicare Other

## 2021-01-29 ENCOUNTER — Encounter (HOSPITAL_COMMUNITY): Admission: EM | Disposition: E | Payer: Self-pay | Source: Home / Self Care | Attending: Internal Medicine

## 2021-01-29 ENCOUNTER — Encounter (HOSPITAL_COMMUNITY): Payer: Self-pay | Admitting: Family Medicine

## 2021-01-29 DIAGNOSIS — R1310 Dysphagia, unspecified: Secondary | ICD-10-CM | POA: Diagnosis not present

## 2021-01-29 DIAGNOSIS — G9341 Metabolic encephalopathy: Secondary | ICD-10-CM | POA: Diagnosis not present

## 2021-01-29 DIAGNOSIS — Z515 Encounter for palliative care: Secondary | ICD-10-CM | POA: Diagnosis not present

## 2021-01-29 DIAGNOSIS — R531 Weakness: Secondary | ICD-10-CM

## 2021-01-29 DIAGNOSIS — E440 Moderate protein-calorie malnutrition: Secondary | ICD-10-CM | POA: Diagnosis not present

## 2021-01-29 DIAGNOSIS — C3490 Malignant neoplasm of unspecified part of unspecified bronchus or lung: Secondary | ICD-10-CM | POA: Diagnosis not present

## 2021-01-29 DIAGNOSIS — N390 Urinary tract infection, site not specified: Secondary | ICD-10-CM | POA: Diagnosis not present

## 2021-01-29 DIAGNOSIS — Z7189 Other specified counseling: Secondary | ICD-10-CM

## 2021-01-29 DIAGNOSIS — Z853 Personal history of malignant neoplasm of breast: Secondary | ICD-10-CM | POA: Diagnosis not present

## 2021-01-29 LAB — CBC WITH DIFFERENTIAL/PLATELET
Abs Immature Granulocytes: NONE SEEN 10*3/uL (ref 0.00–0.07)
Band Neutrophils: 2 %
Band Neutrophils: 7 %
Basophils Absolute: 0 10*3/uL (ref 0.0–0.1)
Basophils Relative: 0 %
Basophils Relative: 0 %
Blasts: NONE SEEN %
Eosinophils Absolute: 0 10*3/uL (ref 0.0–0.5)
Eosinophils Relative: 0 %
Eosinophils Relative: 0 %
HCT: 26.1 % — ABNORMAL LOW (ref 36.0–46.0)
HCT: 26 % — ABNORMAL LOW (ref 36.0–46.0)
Hemoglobin: 8.4 g/dL — ABNORMAL LOW (ref 12.0–15.0)
Hemoglobin: 8.4 g/dL — ABNORMAL LOW (ref 12.0–15.0)
Immature Granulocytes: NONE SEEN %
Immature Granulocytes: 7 %
Lymphocytes Relative: 24 %
Lymphocytes Relative: 19 %
Lymphs Abs: 0.5 10*3/uL — ABNORMAL LOW (ref 0.7–4.0)
MCH: 27.2 pg (ref 26.0–34.0)
MCH: 27.3 pg (ref 26.0–34.0)
MCHC: 32.2 g/dL (ref 30.0–36.0)
MCHC: 32.3 g/dL (ref 30.0–36.0)
MCV: 84.5 fL (ref 80.0–100.0)
MCV: 84.4 fL (ref 80.0–100.0)
Metamyelocytes Relative: NONE SEEN %
Monocytes Absolute: 0.6 10*3/uL (ref 0.1–1.0)
Monocytes Relative: 16 %
Monocytes Relative: 23 %
Myelocytes: NONE SEEN %
Neutro Abs: 1.4 10*3/uL — ABNORMAL LOW (ref 1.7–7.7)
Neutrophils Relative %: 58 %
Neutrophils Relative %: 51 %
Platelets: 136 10*3/uL — ABNORMAL LOW (ref 150–400)
Platelets: 146 10*3/uL — ABNORMAL LOW (ref 150–400)
Promyelocytes Relative: NONE SEEN %
RBC Morphology: NORMAL
RBC: 3.09 MIL/uL — ABNORMAL LOW (ref 3.87–5.11)
RBC: 3.08 MIL/uL — ABNORMAL LOW (ref 3.87–5.11)
RDW: 15.9 % — ABNORMAL HIGH (ref 11.5–15.5)
RDW: 15.9 % — ABNORMAL HIGH (ref 11.5–15.5)
WBC: 2.2 10*3/uL — ABNORMAL LOW (ref 4.0–10.5)
WBC: 2.5 10*3/uL — ABNORMAL LOW (ref 4.0–10.5)
nRBC: 3.6 % — ABNORMAL HIGH (ref 0.0–0.2)
nRBC: 9 /100 WBC — ABNORMAL HIGH
nRBC: 2.8 % — ABNORMAL HIGH (ref 0.0–0.2)

## 2021-01-29 LAB — BASIC METABOLIC PANEL
Anion gap: 8 (ref 5–15)
BUN: 11 mg/dL (ref 8–23)
CO2: 20 mmol/L — ABNORMAL LOW (ref 22–32)
Calcium: 8.7 mg/dL — ABNORMAL LOW (ref 8.9–10.3)
Chloride: 114 mmol/L — ABNORMAL HIGH (ref 98–111)
Creatinine, Ser: 0.73 mg/dL (ref 0.44–1.00)
GFR, Estimated: >60 mL/min (ref >60)
Glucose, Bld: 102 mg/dL — ABNORMAL HIGH (ref 70–99)
Potassium: 4.2 mmol/L (ref 3.5–5.1)
Sodium: 142 mmol/L (ref 135–145)

## 2021-01-29 LAB — URINALYSIS, ROUTINE W REFLEX MICROSCOPIC
Bilirubin Urine: NEGATIVE
Glucose, UA: NEGATIVE mg/dL
Ketones, ur: 5 mg/dL — AB
Nitrite: NEGATIVE
Protein, ur: NEGATIVE mg/dL
Specific Gravity, Urine: 1.008 (ref 1.005–1.030)
pH: 7 (ref 5.0–8.0)

## 2021-01-29 LAB — RENAL FUNCTION PANEL
Albumin: 3.1 g/dL — ABNORMAL LOW (ref 3.5–5.0)
Anion gap: 9 (ref 5–15)
BUN: 11 mg/dL (ref 8–23)
CO2: 20 mmol/L — ABNORMAL LOW (ref 22–32)
Calcium: 8.9 mg/dL (ref 8.9–10.3)
Chloride: 115 mmol/L — ABNORMAL HIGH (ref 98–111)
Creatinine, Ser: 0.75 mg/dL (ref 0.44–1.00)
GFR, Estimated: >60 mL/min (ref >60)
Glucose, Bld: 104 mg/dL — ABNORMAL HIGH (ref 70–99)
Phosphorus: 3.4 mg/dL (ref 2.5–4.6)
Potassium: 4.2 mmol/L (ref 3.5–5.1)
Sodium: 144 mmol/L (ref 135–145)

## 2021-01-29 LAB — MAGNESIUM: Magnesium: 1.6 mg/dL — ABNORMAL LOW (ref 1.7–2.4)

## 2021-01-29 SURGERY — CANCELLED PROCEDURE
Anesthesia: Monitor Anesthesia Care

## 2021-01-29 MED ORDER — HYDRALAZINE HCL 20 MG/ML IJ SOLN
10.0000 mg | INTRAMUSCULAR | Status: DC | PRN
  Administered 2021-01-29: 10 mg via INTRAVENOUS

## 2021-01-29 MED ORDER — MAGNESIUM SULFATE 2 GM/50ML IV SOLN
2.0000 g | Freq: Once | INTRAVENOUS | Status: AC
  Administered 2021-01-29: 2 g via INTRAVENOUS
  Filled 2021-01-29: qty 50

## 2021-01-29 MED ORDER — ADULT MULTIVITAMIN W/MINERALS CH
1.0000 | ORAL_TABLET | Freq: Every day | ORAL | Status: DC
  Filled 2021-01-29 (×2): qty 1

## 2021-01-29 MED ORDER — LACTATED RINGERS IV SOLN: INTRAVENOUS | Status: DC

## 2021-01-29 MED ORDER — HYDRALAZINE HCL 20 MG/ML IJ SOLN
INTRAMUSCULAR | Status: AC
  Filled 2021-01-29: qty 1

## 2021-01-29 MED ORDER — SODIUM CHLORIDE 0.9 % IV SOLN
1.0000 g | Freq: Every day | INTRAVENOUS | Status: DC
  Administered 2021-01-29 – 2021-01-31 (×3): 1 g via INTRAVENOUS
  Filled 2021-01-29: qty 1
  Filled 2021-01-29 (×2): qty 10
  Filled 2021-01-29: qty 1

## 2021-01-29 SURGICAL SUPPLY — 14 items

## 2021-01-29 NOTE — Progress Notes (Signed)
OT Cancellation Note  Patient Details Name: [name] MRN: [id number] DOB: [birth year]   Cancelled Treatment:    Reason Eval/Treat Not Completed: Patient at procedure or test/ unavailable (Will continue to follow.)  Malka So 01/17/2021, 11:18 AM  Nestor Lewandowsky, OTR/L Acute Rehabilitation Services Pager: [phone] Office: [phone]

## 2021-01-29 NOTE — Progress Notes (Signed)
CBC, BMP and UA ordered by Kennon Holter, NP. New Purewick placed on patient.

## 2021-01-29 NOTE — Progress Notes (Signed)
PROGRESS NOTE  Tara Rodgers  DOB: [birth year]  PCP: Elby Showers, MD JKD:[id number]  DOA: 02/10/2021  LOS: 14 days   Chief Complaint  Patient presents with  . Emesis  . Nausea  . Dysphagia    Brief narrative: Tara Rodgers is a 75 y.o. female with PMH significant for metastatic SCLC undergoing chemotherapy s/p XRT, transfusion-dependent anemia, thrombocytopenia, right breast CA s/p mastectomy 2016 on antiestrogen therapy. Patient presented to the ED on 01/21/2021 with complaint of 3 weeks of constant, worsening, severe dysphagia, globus sensation, and odynophagia that has limited her oral intake severely to the point that she was not able to tolerate any po and not able to take her medications.  In the ED, she appeared dehydrated, tachycardic to 130s, afebrile, normotensive, benign abdominal exam Labs with ketones in urine, potassium low at 2.9, metabolic acidosis, LFTs elevated.   Patient was admitted to hospitalist service See below for details  01/26/2021: Patient seen.  No new changes.  Awaiting esophageal stenting on Monday. 01/27/2021: Patient seen alongside patient's husband.  No new GI complaints.  For esophageal stenting on Monday. 01/28/2021: Patient seems more confused today.  Would not recognize her husband.  We will proceed with MRI of the brain with and without contrast.  Further management will depend on above. 01/13/2021: Encephalopathy persists.  Patient has been pancultured.  Esophageal stenting was consulted.  Palliative care team was consulted.  Patient is now DO NOT RESUSCITATE.  Likely discharge tomorrow with hospice.  Patient was seen alongside patient's husband and daughter.  Urinalysis just noted.  Urinalysis is suggestive of likely UTI.  We will send urine for culture and patient was started on IV Rocephin.  Recent changes may be secondary to combined toxic and metabolic encephalopathy in the setting of neutropenia.    Subjective: Patient seen. Patient remains  encephalopathic. Patient cannot not give any history.          Assessment/Plan: Dysphagia, odynophagia -Initially planned for EGD but could not be done because of pancytopenia.   -Finally underwent EGD on 4/13.  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  -GI recommends continuation of IV Protonix Carafate, clear liquid diet.  Pending cytology brushing.  Candida and herpes.  Continue Diflucan in the meantime. -4/14, noted a plan for esophageal stenting on Monday. 01/26/2021: For esophageal stenting on Monday. 01/28/2021: For esophageal stent placement tomorrow. 02/04/2021: Esophageal stenting counseled as patient was unstable.  Palliative care team consulted to discuss goals of care.  Pancytopenia -CBC being monitored.  Labs as below.  -Platelet count significantly improved, 79,000 today -ANC still remains low, hemoglobin at 7.9.  Per oncology, to transfuse if less than 7.5 Recent Labs  Lab 01/28/2021 1507 01/22/2021 0610 01/25/21 0512 01/26/21 0528 01/28/21 2238 01/16/2021 0554  WBC 2.3* 1.5* 1.4* 1.4* 2.2* 2.5*  NEUTROABS 1.1* 0.7* 0.7* 0.8*  --  1.4*  HGB 8.7* 8.3* 7.9* 8.0* 8.4* 8.4*  HCT 26.0* 25.1* 24.0* 23.9* 26.1* 26.0*  MCV 81.8 82.8 83.0 82.1 84.5 84.4  PLT 81* 65* 79* 96* 136* 146*   Encephalopathy/likely UTI: -Suspect patient may have combined toxic and metabolic encephalopathy. -Patient remains neutropenic with ANC of 1. -UA suggestive of likely UTI. -Follow cultures. -Start patient on IV Rocephin. -MRI brain is negative for metastatic spread.  Moderate protein calorie malnutrition -Supplement protein as much as able  Hypokalemia/hypomagnesemia -Potassium low at 3, magnesium low at 1.5 today.  IV  replacement given.  Repeat tomorrow.   01/26/21: Potassium is 3 today. Will continue to replete. Will also administer IV magnesium 2 gm X 1  dose. 01/28/2021: Continue to replete magnesium and potassium.  Potassium is 3 today. 02/07/2021: Potassium is 4.2 today.  Magnesium is 1.6.  Will replete magnesium.  Recent Labs  Lab 01/22/2021 0816 01/28/2021 1200 01/21/2021 0610 01/13/2021 0726 01/25/21 0512 01/26/21 0528 01/27/21 0534 01/28/21 2238 01/17/2021 0543 02/02/2021 0554  K  --    < >  --    < > 3.0* 3.0* 3.0*  3.0* 4.2 4.2  --   MG 1.5*  --  1.9  --  1.5*  --  2.0  --   --  1.6*  PHOS  --   --   --   --  3.2  --  3.8  --  3.4  --    < > = values in this interval not displayed.   Stage IV small cell lung cancer -Seen on PET 1/28 started on etoposide, carboplatin and atezolizumab 11/22/2020, s/p palliative mediastinal XRT 1/31 - 2/15.  -Oncology, Dr. [name] following 01/28/21: Encephalopathic today.  For MRI of the brain.  Solid 1.2 cm hypoechoic lesion in the posterosuperior aspect of the right liver: Nonspecific.  -Defer to oncology   HTN, tachycardia -Continues to have tachycardia probably related to dehydration.  IV metoprolol, hopefully could change back to po formulation of medications soon.  Hypothyroidism -Continue synthroid to IV for now. Has had good response following TSH.  History of breast CA -On antiestrogen which we will hold while unable to take po.  History of covid-19 infection and s/p 2 doses of vaccine -Recommended 3rd dose.  Impaired mobility -PT eval obtained. SNF recommended.  But patient wants to go home ultimately.  Palliative care has been consulted.  If patient does not improve, patient may be discharged with hospice follow-up.  Palliative care team is discussing with the family.  Treatment of concurrent infection may lead to significant overall improvement, however, long-term prognosis remains guarded/poor.    Mobility: PT eval obtained Code Status:   Code Status: DNR  Nutritional status: Body mass index is 21.68 kg/m. Nutrition Problem: Moderate Malnutrition Etiology: chronic  illness,cancer and cancer related treatments Signs/Symptoms: energy intake < or equal to 75% for > or equal to 1 month,percent weight loss,mild fat depletion,mild muscle depletion Diet Order            Diet NPO time specified Except for: Sips with Meds  Diet effective midnight                 DVT prophylaxis: SCDs Start: 01/20/2021 1939   Antimicrobials:  IV fluconazole Fluid: NS with potassium at 100 mill per hour Consultants: GI, oncology Family Communication:  None at bedside  Status is: Inpatient  Remains inpatient appropriate because: Continues to have dysphagia, pancytopenia  Dispo: The patient is from: Home              Anticipated d/c is to: Home with home health PT 1 to 2 days              Patient currently is not medically stable to d/c.   Difficult to place patient No   Infusions:  . 0.9 % NaCl with KCl 40 mEq / L 100 mL/hr at 01/28/21 2004  . fluconazole (DIFLUCAN) IV 200 mg (02/10/2021 1049)  . magnesium sulfate bolus IVPB 2 g (01/13/2021 1533)    Scheduled Meds: . Chlorhexidine Gluconate Cloth  6 each Topical Daily  . docusate sodium  100 mg Oral BID  . feeding supplement  237 mL Oral TID BM  . fentaNYL  1 patch Transdermal Q72H  . levothyroxine  50 mcg Intravenous Daily  . mouth rinse  15 mL Mouth Rinse BID  . metoprolol tartrate  5 mg Intravenous Q6H  . multivitamin with minerals  1 tablet Oral Daily  . nystatin  5 mL Oral QID  . pantoprazole (PROTONIX) IV  40 mg Intravenous Q12H  . polyethylene glycol  17 g Oral Daily  . sodium chloride flush  10-40 mL Intracatheter Q12H  . sodium chloride flush  3 mL Intravenous Q12H  . sucralfate  1 g Oral TID WC & HS    Antimicrobials: Anti-infectives (From admission, onward)   Start     Dose/Rate Route Frequency Ordered Stop   01/16/21 1000  fluconazole (DIFLUCAN) IVPB 200 mg        200 mg 100 mL/hr over 60 Minutes Intravenous Daily 01/15/21 1024     01/15/21 1300  cefTRIAXone (ROCEPHIN) 1 g in sodium chloride  0.9 % 100 mL IVPB  Status:  Discontinued        1 g 200 mL/hr over 30 Minutes Intravenous Every 24 hours 01/15/21 1210 01/21/21 1210   01/15/21 1100  fluconazole (DIFLUCAN) IVPB 400 mg        400 mg 100 mL/hr over 120 Minutes Intravenous  Once 01/15/21 1024 01/15/21 1326      PRN meds: acetaminophen **OR** [DISCONTINUED] acetaminophen, hydrALAZINE, LORazepam, magic mouthwash w/lidocaine, ondansetron (ZOFRAN) IV, sodium chloride flush   Objective: Vitals:   02/05/2021 1121 01/28/2021 1515  BP: (!) 201/96 (!) 163/93  Pulse: (!) 129 (!) 144  Resp:  16  Temp:  98.1 F (36.7 C)  SpO2: 100% 100%    Intake/Output Summary (Last 24 hours) at 01/28/2021 1631 Last data filed at 02/06/2021 1048 Gross per 24 hour  Intake 1508.97 ml  Output 1600 ml  Net -91.03 ml   Filed Weights   01/25/21 0530 01/26/21 0700 01/27/21 0500  Weight: 49.8 kg 49.5 kg 48.7 kg   Weight change:  Body mass index is 21.68 kg/m.   Physical Exam: General exam: Pleasant elderly Caucasian female.  Patient remains encephalopathic. HEENT: Patient is pale. No jaundice.  Lungs: Clear to auscultation bilaterally CVS: S1S2 GI/Abd soft, nontender, nondistended, bowel sound present CNS: Awake and alert, but could not recognize the husband. Extremities: No pedal edema, no calf tenderness  Data Review: I have personally reviewed the laboratory data and studies available.  Recent Labs  Lab 01/25/2021 1507 01/22/2021 0610 01/25/21 0512 01/26/21 0528 01/28/21 2238 01/30/2021 0554  WBC 2.3* 1.5* 1.4* 1.4* 2.2* 2.5*  NEUTROABS 1.1* 0.7* 0.7* 0.8*  --  1.4*  HGB 8.7* 8.3* 7.9* 8.0* 8.4* 8.4*  HCT 26.0* 25.1* 24.0* 23.9* 26.1* 26.0*  MCV 81.8 82.8 83.0 82.1 84.5 84.4  PLT 81* 65* 79* 96* 136* 146*   Recent Labs  Lab 02/09/2021 0816 01/26/2021 1200 02/03/2021 0610 01/31/2021 0726 01/25/21 0512 01/26/21 0528 01/27/21 0534 01/28/21 2238 02/07/2021 0543 01/22/2021 0554  NA  --    < >  --    < > 134* 134* 139  138 142 144  --    K  --    < >  --    < > 3.0* 3.0* 3.0*  3.0* 4.2 4.2  --   CL  --    < >  --    < >  105 105 107  106 114* 115*  --   CO2  --    < >  --    < > 19* 19* 19*  20* 20* 20*  --   GLUCOSE  --    < >  --    < > 102* 100* 119*  121* 102* 104*  --   BUN  --    < >  --    < > 7* 6* 9  9 11 11   --   CREATININE  --    < >  --    < > 0.51 0.57 0.62  0.62 0.73 0.75  --   CALCIUM  --    < >  --    < > 8.0* 7.9* 8.6*  8.7* 8.7* 8.9  --   MG 1.5*  --  1.9  --  1.5*  --  2.0  --   --  1.6*  PHOS  --   --   --   --  3.2  --  3.8  --  3.4  --    < > = values in this interval not displayed.    F/u labs ordered Unresulted Labs (From admission, onward)          Start     Ordered   01/17/2021 0549  Culture, blood (routine x 2)  BLOOD CULTURE X 2,   R (with TIMED occurrences)      01/26/2021 0548   01/28/21 2200  Urinalysis, Routine w reflex microscopic Urine, Clean Catch  Once,   R        01/28/21 2159          Signed, Bonnell Public, MD [facility] 01/15/2021

## 2021-01-29 NOTE — Progress Notes (Signed)
Tara Rodgers 12:52 PM  Subjective: Patient's case discussed with the anesthesia team and she is clearly much different than the last 4 days that I have seen her and after I left her yesterday before her family got there she developed altered mental status and despite nondiagnostic brain MRI she is still not oriented or awake very often and I had a long conversation with the husband and the daughter and based on her obvious change in her mental status and probably her increased heart rate and respiratory rate we have elected to hold off on the procedure and we did have a long talk about the options to include TPN PEG placement proceeding with just a dilation and we have elected to see if the medical team can reverse her current condition before we decide how to proceed  Objective: Increased heart rate increased respiratory rate not oriented not answering questions appropriately very different than yesterday and 3 previous days increased rhonchi decreased heart sounds abdomen is a little tender to exam occasional bowel sound labs chest x-ray and brain MRI reviewed no obvious cause to her current condition  Assessment: Multiple medical problems including 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

## 2021-01-29 NOTE — Anesthesia Preprocedure Evaluation (Addendum)
Anesthesia Evaluation  Patient identified by MRN, date of birth, ID band Patient awake    Reviewed: Allergy & Precautions, H&P , NPO status , Patient's Chart, lab work & pertinent test results, reviewed documented beta blocker date and time   Airway Mallampati: II  TM Distance: >3 FB Neck ROM: Full    Dental no notable dental hx. (+) Edentulous Upper, Dental Advisory Given, Partial Lower   Pulmonary asthma ,    Pulmonary exam normal breath sounds clear to auscultation       Cardiovascular hypertension, Pt. on home beta blockers and Pt. on medications  Rhythm:Regular Rate:Tachycardia     Neuro/Psych Anxiety negative neurological ROS     GI/Hepatic Neg liver ROS, hiatal hernia, GERD  Medicated,  Endo/Other  Hypothyroidism   Renal/GU negative Renal ROS  negative genitourinary   Musculoskeletal  (+) Arthritis , Osteoarthritis,    Abdominal   Peds  Hematology negative hematology ROS (+)   Anesthesia Other Findings   Reproductive/Obstetrics negative OB ROS                            Anesthesia Physical Anesthesia Plan  ASA: III  Anesthesia Plan: MAC   Post-op Pain Management:    Induction: Intravenous  PONV Risk Score and Plan: 2 and Propofol infusion and Treatment may vary due to age or medical condition  Airway Management Planned: Simple Face Mask  Additional Equipment:   Intra-op Plan:   Post-operative Plan:   Informed Consent: I have reviewed the patients History and Physical, chart, labs and discussed the procedure including the risks, benefits and alternatives for the proposed anesthesia with the patient or authorized representative who has indicated his/her understanding and acceptance.   Patient has DNR.  Discussed DNR with power of attorney.   Dental advisory given  Plan Discussed with: CRNA  Anesthesia Plan Comments: (Family wants [place] to do everything but chest  compressions in the event of cardiac arrest during anesthesia. They are concerned about her ability to recover from having her ribs broken from chest compressions.  Case cancelled by Dr. Watt Climes due to patient mental status changes.)      Anesthesia Quick Evaluation

## 2021-01-29 NOTE — Consult Note (Signed)
Consultation Note Date: 02/10/2021   Patient Name: Tara Rodgers  DOB: 06-Aug-1946  MRN: [id number]  Age / Sex: 75 y.o., female  PCP: Renold Genta [name] [name], MD Referring Physician: Bonnell Public, MD  Reason for Consultation: Establishing goals of care  HPI/Patient Profile: 75 y.o. 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 Patient presented with worsening dysphagia odynophagia limited oral intake, seen by GI, esophageal stenting to be performed today. Hospital course also complicated by episodic confusion, MRI of the brain has been ordered.  Palliative consult for goals of care has been requested.  Patient is awake alert resting in bed.  She appears with generalized weakness and chronically ill.  Her daughter and granddaughter present at the bedside.  I introduced myself and palliative care as follows:  Palliative medicine is specialized medical care for people living with serious illness. It focuses on providing relief from the symptoms and stress of a serious illness. The goal is to improve quality of life for both the patient and the family.  Goals of care: Broad aims of medical therapy in relation to the patient's values and preferences. Our aim is to provide medical care aimed at enabling patients to achieve the goals that matter most to them, given the circumstances of their particular medical situation and their constraints.   See below.  NEXT OF KIN Daughter and husband.  SUMMARY OF RECOMMENDATIONS   Agree with DNR. Continue current mode of care And follow-up on results of esophageal stenting to be done today as well  as MRI of the brain. Palliative care to follow for ongoing goals of care discussions, thank you for the consult.  Code Status/Advance Care Planning:  DNR    Symptom Management:      Palliative Prophylaxis:   Delirium Protocol   Psycho-social/Spiritual:   Desire for further Chaplaincy support:yes  Additional Recommendations: Caregiving  Support/Resources  Prognosis:   Unable to determine  Discharge Planning: To Be Determined      Primary Diagnoses: Present on Admission: . Hypothyroidism . Small cell lung cancer ([place])   I have reviewed the medical record, interviewed the patient and family, and examined the patient. The following aspects are pertinent.  Past Medical History:  Diagnosis Date  . Anxiety   . Asthma    does not take any medications, hx of inhaler use  . Breast cancer [facility]) 1995/2016   right sided breast cancer in 08/1994  . Bronchitis    hx of  . GERD (gastroesophageal reflux disease)   . History of hiatal hernia   . Hypertension   . Hyperthyroidism   . Lung cancer ([place]) 11/02/2020   Social History   Socioeconomic History  . Marital status: Married    Spouse name: Not on file  . Number of children: 2  .  Years of education: Not on file  . Highest education level: Not on file  Occupational History  . Not on file  Tobacco Use  . Smoking status: Never Smoker  . Smokeless tobacco: Never Used  Substance and Sexual Activity  . Alcohol use: Yes    Comment: occasional  . Drug use: No  . Sexual activity: Not Currently  Other Topics Concern  . Not on file  Social History Narrative  . Not on file   Social Determinants of Health   Financial Resource Strain: Not on file  Food Insecurity: Not on file  Transportation Needs: Not on file  Physical Activity: Not on file  Stress: Not on file  Social Connections: Not on file   Family History  Problem Relation Age of Onset  . COPD Father   . Heart disease Father    Scheduled Meds: .  Chlorhexidine Gluconate Cloth  6 each Topical Daily  . docusate sodium  100 mg Oral BID  . feeding supplement  237 mL Oral TID BM  . fentaNYL  1 patch Transdermal Q72H  . levothyroxine  50 mcg Intravenous Daily  . mouth rinse  15 mL Mouth Rinse BID  . metoprolol tartrate  5 mg Intravenous Q6H  . nystatin  5 mL Oral QID  . pantoprazole (PROTONIX) IV  40 mg Intravenous Q12H  . polyethylene glycol  17 g Oral Daily  . sodium chloride flush  10-40 mL Intracatheter Q12H  . sodium chloride flush  3 mL Intravenous Q12H  . sucralfate  1 g Oral TID WC & HS   Continuous Infusions: . sodium chloride 20 mL/hr at 01/28/21 2215  . 0.9 % NaCl with KCl 40 mEq / L 100 mL/hr at 01/28/21 2004  . fluconazole (DIFLUCAN) IV 200 mg (01/22/2021 1049)   PRN Meds:.acetaminophen **OR** [DISCONTINUED] acetaminophen, LORazepam, magic mouthwash w/lidocaine, ondansetron (ZOFRAN) IV, sodium chloride flush Medications Prior to Admission:  Prior to Admission medications   Medication Sig Start Date End Date Taking? Authorizing Provider  ALPRAZolam Duanne Moron) 0.5 MG tablet Take 1 tablet by mouth twice daily as needed Patient taking differently: Take 0.5 mg by mouth 2 (two) times daily as needed for anxiety. 12/24/20  Yes [name], [name] [name], MD  esomeprazole (NEXIUM) 40 MG capsule One po daily at bedtime Patient taking differently: Take by mouth See admin instructions. Open contents of capsule and sprinkle on liquid - take by mouth daily at bedtime 08/24/20  Yes [name], [name] [name], MD  fentaNYL (DURAGESIC) 12 MCG/HR PLACE 1 PATCH ONTO THE SKIN EVERY 3 DAYS. Patient taking differently: Place 1 patch onto the skin every 3 (three) days. 01/01/21 06/30/21 Yes [name], MD  levothyroxine (SYNTHROID) 125 MCG tablet Take 1 tablet (125 mcg total) by mouth daily. Patient taking differently: Take 125 mcg by mouth daily before breakfast. 12/22/20  Yes [name], [name] [name], MD  lidocaine-prilocaine (EMLA) cream Apply 1 application topically as  needed. Patient taking differently: Apply 1 application topically as needed (prior to port access). 12/11/20  Yes [name], MD  magic mouthwash w/lidocaine SOLN Swish and spit 10 ml by mouth every 6 hours as needed Patient taking differently: Take 10 mLs by mouth See admin instructions. Swish and spit 10 ml by mouth every 6 hours as needed for mouth pain 01/11/21  Yes [name], MD  megestrol (MEGACE ES) 625 MG/5ML suspension Take 5 mLs (625 mg total) by mouth daily. 12/18/20  Yes [name], MD  ondansetron (ZOFRAN-ODT) 8 MG disintegrating tablet Take  1 tablet (8 mg total) by mouth every 8 (eight) hours as needed for nausea or vomiting. 01/05/21  Yes [name], MD  prochlorperazine (COMPAZINE) 10 MG tablet Take 1 tablet (10 mg total) by mouth every 6 (six) hours as needed (Nausea or vomiting). 11/16/20  Yes [name], MD  senna-docusate (SENOKOT-S) 8.6-50 MG tablet Take 1 tablet by mouth daily. Patient taking differently: Take 1 tablet by mouth daily as needed. 11/28/20  Yes Carmin Muskrat, MD  allopurinol (ZYLOPRIM) 300 MG tablet Take 1 tablet (300 mg total) by mouth daily. Patient not taking: No sig reported 11/17/20   [name], MD  cyclobenzaprine (FLEXERIL) 10 MG tablet Take 1 tablet (10 mg total) by mouth 3 (three) times daily as needed for muscle spasms. Patient not taking: No sig reported 10/18/19   Elby Showers, MD  HYDROcodone-acetaminophen (HYCET) 7.5-325 mg/15 ml solution Take 10-15 mLs by mouth every 4 (four) hours as needed for moderate pain or severe pain. Patient not taking: No sig reported 12/18/20   [name], MD  letrozole [facility]) 2.5 MG tablet Take 1 tablet (2.5 mg total) by mouth daily. Patient not taking: Reported on 01/28/2021 03/30/20   [name], MD  lidocaine (LIDODERM) 5 % Place 1 patch onto the skin daily. Remove & Discard patch within 12 hours or as directed by MD Patient not taking: No sig reported 11/27/20   [name], MD  metoprolol succinate (TOPROL-XL) 50 MG 24 hr tablet Take  1 tablet by mouth twice daily Patient not taking: No sig reported 11/20/20   Lorretta Harp, MD  ondansetron (ZOFRAN) 8 MG tablet Take 1 tablet (8 mg total) by mouth 2 (two) times daily as needed for refractory nausea / vomiting. Start on day 3 after carboplatin chemo. Patient not taking: No sig reported 11/16/20   [name], MD  potassium chloride (MICRO-K) 10 MEQ CR capsule Take 1 capsule (10 mEq total) by mouth 2 (two) times daily. Patient not taking: No sig reported 12/14/20   [name], MD  potassium chloride SA (KLOR-CON) 20 MEQ tablet Take 1 tablet (20 mEq total) by mouth 2 (two) times daily. Patient not taking: No sig reported 12/11/20   [name], MD  verapamil (CALAN-SR) 240 MG CR tablet Take 1 tablet by mouth once daily Patient not taking: No sig reported 09/11/20   Elby Showers, MD   Allergies  Allergen Reactions  . Fosamax [Alendronate Sodium] Other (See Comments)    Aching all over   Review of Systems Denies pain.   Physical Exam Weak appearing lady resting in bed Regular work of breathing S1-S2  Awake alert resting in bed  appears with generalized weakness  appears chronically ill.  Vital Signs: BP (!) 168/101 (BP Location: Right Arm)   Pulse (!) 128   Temp 97.9 F (36.6 C) (Oral)   Resp 16   Ht 4\' 11"  (1.499 m)   Wt 48.7 kg   SpO2 97%   BMI 21.68 kg/m  Pain Scale: 0-10   Pain Score: 0-No pain   SpO2: SpO2: 97 % O2 Device:SpO2: 97 % O2 Flow Rate: .O2 Flow Rate (L/min): 10 L/min  IO: Intake/output summary:   Intake/Output Summary (Last 24 hours) at 01/16/2021 1107 Last data filed at 01/26/2021 0302 Gross per 24 hour  Intake 2431.58 ml  Output 1200 ml  Net 1231.58 ml    LBM: Last BM Date: 01/22/21 Baseline Weight: Weight: 51 kg Most recent weight: Weight: 48.7 kg  Palliative Assessment/Data:   PPS 50%  Time In:  10 Time Out:  11 Time Total:  60  Greater than 50%  of this time was spent counseling and coordinating care related to the above  assessment and plan.  Signed by: Loistine Chance, MD   Please contact Palliative Medicine Team phone at [phone] for questions and concerns.  For individual provider: See Shea Evans

## 2021-01-29 NOTE — Progress Notes (Signed)
PMT no charge note.   Alerted by patient's RN that the patient did not undergo GI procedure esophageal stenting, and that husband and daughter wished to discuss goals of care.   Patient has had little PO intake since February, she has had ongoing gradual progressive decline. Since the weekend, patient has been more lethargic, not alert, not able to interact with family.   We discussed about the patient's current condition and what would be her wishes. Family would like to consider taking the patient home, with hospice support, if patient continues to have decline here in the hospital. We also talked about residential hospice in some detail.   DNR, continue with current plan of care, patient has a son who lives in [place], patient lives at home with husband, daughter lives nearby.  We plan to re discuss goals of care with patient/family on 01-30-21. PMT to follow.   No charge Greater than 50%  of this time was spent counseling and coordinating care related to the above assessment and plan. Loistine Chance MD [place] [facility].

## 2021-01-29 NOTE — Progress Notes (Signed)
Nutrition Follow-up  DOCUMENTATION CODES:  Non-severe (moderate) malnutrition in context of chronic illness  INTERVENTION:  Add MVI with minerals daily.  Continue Ensure Enlive po TID, each supplement provides 350 kcal and 20 grams of protein.  Further nutrition care to be determined while patient's family and palliative determine goals of care.  NUTRITION DIAGNOSIS:  Moderate Malnutrition related to chronic illness,cancer and cancer related treatments as evidenced by energy intake < or equal to 75% for > or equal to 1 month,percent weight loss,mild fat depletion,mild muscle depletion.  GOAL:  Patient will meet greater than or equal to 90% of their needs  MONITOR:  PO intake,Supplement acceptance,Weight trends,Labs,I & O's  REASON FOR ASSESSMENT:  Malnutrition Screening Tool    ASSESSMENT:  75 y.o. female with a history of metastatic SCLC undergoing chemotherapy s/p XRT, transfusion-dependent anemia, thrombocytopenia, right breast CA s/p mastectomy 2016 on antiestrogen therapy who has experienced 3 weeks of constant, worsening, severe dysphagia, globus sensation, and odynophagia that has limited per oral intake severely to the point that she isn't able to tolerate any po and not able to take her medications. In the ED she appeared dehydrated with ketones in urine. 4/17 - confused, did not recognize husband, ordered brain MRI (results were nondiagnostic) 4/18 - esophageal stent placement  Per Epic, pt eating 10-30%, mostly 10% of meals the last few days that were documented. Per nursing, daughter informed them that she has not been eating or drinking much of anything.  Per GI, stent was postponed given increased confusion and nondiagnostic MRI. GI to wait to see what medical care team can do to help with this condition before determining feeding, whether it be a PEG or TPN, after discussing with husband and daughter.  Last wt was on 4/16 at 107 lbs. Has lost 5 lbs since  admission.  Per last RD note, pt refused a feeding tube per [place] RD.  Continue Ensure TID to see if pt will drink a little. Further nutrition care to be determined as pt's family and palliative care to determine goals of care. Recommend adding MVI with minerals daily.  Medications: colace BID, fentanyl patch, levothyroxine, nystatin QID, miralax, sucralfate, IVF @ 20 ml/hr, IVF w/ KCl 40 mEq @ 100 ml/hr Labs: reviewed; Glucose 104, Mag 1.6  Diet Order:   Diet Order            Diet NPO time specified Except for: Sips with Meds  Diet effective midnight                EDUCATION NEEDS:  Education needs have been addressed  Skin:  Skin Assessment: Skin Integrity Issues: Skin Integrity Issues:: Stage II Stage II: mid sacrum  Last BM:  01/22/21 - pt refusing oral bowel regimen as of 4/17  Height:  Ht Readings from Last 1 Encounters:  01/16/21 4\' 11"  (1.499 m)   Weight:  Wt Readings from Last 1 Encounters:  01/27/21 48.7 kg   Ideal Body Weight:  44.7 kg  BMI:  Body mass index is 21.68 kg/m.  Estimated Nutritional Needs:  Kcal:  1600-1800 Protein:  80-95g Fluid:  1.8L/day  Derrel Nip, RD, LDN Registered Dietitian After Hours/Weekend Pager # in [place]

## 2021-01-30 DIAGNOSIS — G9341 Metabolic encephalopathy: Secondary | ICD-10-CM | POA: Diagnosis not present

## 2021-01-30 DIAGNOSIS — N390 Urinary tract infection, site not specified: Secondary | ICD-10-CM | POA: Diagnosis not present

## 2021-01-30 DIAGNOSIS — Z7189 Other specified counseling: Secondary | ICD-10-CM | POA: Diagnosis not present

## 2021-01-30 DIAGNOSIS — Z515 Encounter for palliative care: Secondary | ICD-10-CM | POA: Diagnosis not present

## 2021-01-30 DIAGNOSIS — R1310 Dysphagia, unspecified: Secondary | ICD-10-CM | POA: Diagnosis not present

## 2021-01-30 DIAGNOSIS — C3490 Malignant neoplasm of unspecified part of unspecified bronchus or lung: Secondary | ICD-10-CM | POA: Diagnosis not present

## 2021-01-30 LAB — CBC
HCT: 26 % — ABNORMAL LOW (ref 36.0–46.0)
Hemoglobin: 8.2 g/dL — ABNORMAL LOW (ref 12.0–15.0)
MCH: 27.3 pg (ref 26.0–34.0)
MCHC: 31.5 g/dL (ref 30.0–36.0)
MCV: 86.7 fL (ref 80.0–100.0)
Platelets: 120 10*3/uL — ABNORMAL LOW (ref 150–400)
RBC: 3 MIL/uL — ABNORMAL LOW (ref 3.87–5.11)
RDW: 16.2 % — ABNORMAL HIGH (ref 11.5–15.5)
WBC: 2.6 10*3/uL — ABNORMAL LOW (ref 4.0–10.5)
nRBC: 2.3 % — ABNORMAL HIGH (ref 0.0–0.2)

## 2021-01-30 LAB — BASIC METABOLIC PANEL
Anion gap: 11 (ref 5–15)
BUN: 10 mg/dL (ref 8–23)
CO2: 17 mmol/L — ABNORMAL LOW (ref 22–32)
Calcium: 8.8 mg/dL — ABNORMAL LOW (ref 8.9–10.3)
Chloride: 122 mmol/L — ABNORMAL HIGH (ref 98–111)
Creatinine, Ser: 0.61 mg/dL (ref 0.44–1.00)
GFR, Estimated: >60 mL/min (ref >60)
Glucose, Bld: 101 mg/dL — ABNORMAL HIGH (ref 70–99)
Potassium: 3.8 mmol/L (ref 3.5–5.1)
Sodium: 150 mmol/L — ABNORMAL HIGH (ref 135–145)

## 2021-01-30 MED ORDER — LORAZEPAM 2 MG/ML IJ SOLN
1.0000 mg | INTRAMUSCULAR | Status: DC | PRN
  Administered 2021-02-03: 1 mg via INTRAVENOUS
  Filled 2021-01-30: qty 1

## 2021-01-30 MED ORDER — HALOPERIDOL 0.5 MG PO TABS: 0.5000 mg | ORAL_TABLET | ORAL | Status: DC | PRN

## 2021-01-30 MED ORDER — HALOPERIDOL LACTATE 5 MG/ML IJ SOLN
0.5000 mg | INTRAMUSCULAR | Status: DC | PRN
  Administered 2021-01-30: 0.5 mg via INTRAVENOUS
  Filled 2021-01-30: qty 1

## 2021-01-30 MED ORDER — MORPHINE SULFATE (PF) 2 MG/ML IV SOLN: 1.0000 mg | INTRAVENOUS | Status: DC | PRN

## 2021-01-30 MED ORDER — GLYCOPYRROLATE 1 MG PO TABS: 1.0000 mg | ORAL_TABLET | ORAL | Status: DC | PRN

## 2021-01-30 MED ORDER — GLYCOPYRROLATE 0.2 MG/ML IJ SOLN
0.2000 mg | INTRAMUSCULAR | Status: DC | PRN
  Administered 2021-02-02: 0.2 mg via INTRAVENOUS
  Filled 2021-01-30: qty 1

## 2021-01-30 MED ORDER — GLYCOPYRROLATE 0.2 MG/ML IJ SOLN: 0.2000 mg | INTRAMUSCULAR | Status: DC | PRN

## 2021-01-30 MED ORDER — LORAZEPAM 1 MG PO TABS: 1.0000 mg | ORAL_TABLET | ORAL | Status: DC | PRN

## 2021-01-30 MED ORDER — LORAZEPAM 2 MG/ML PO CONC: 1.0000 mg | ORAL | Status: DC | PRN

## 2021-01-30 MED ORDER — HALOPERIDOL LACTATE 2 MG/ML PO CONC
0.5000 mg | ORAL | Status: DC | PRN
  Filled 2021-01-30: qty 0.3

## 2021-01-30 MED ORDER — POLYVINYL ALCOHOL 1.4 % OP SOLN: 1.0000 [drp] | Freq: Four times a day (QID) | OPHTHALMIC | Status: DC | PRN

## 2021-01-30 MED ORDER — POTASSIUM CHLORIDE IN NACL 20-0.45 MEQ/L-% IV SOLN
INTRAVENOUS | Status: DC
  Filled 2021-01-30 (×4): qty 1000

## 2021-01-30 MED ORDER — BIOTENE DRY MOUTH MT LIQD: 15.0000 mL | OROMUCOSAL | Status: DC | PRN

## 2021-01-30 MED FILL — Dexamethasone Sodium Phosphate Inj 100 MG/10ML: INTRAMUSCULAR | Qty: 1 | Status: AC

## 2021-01-30 MED FILL — Fosaprepitant Dimeglumine For IV Infusion 150 MG (Base Eq): INTRAVENOUS | Qty: 5 | Status: AC

## 2021-01-30 NOTE — Progress Notes (Signed)
Brief Nutrition Note:  RD consulted for TPN evaluation. Completed full follow-up assessment yesterday regarding most current nutrition status.   Another palliative care meeting with daughter and husband is this afternoon to further discuss goals of care.  Given the pt's situation, the costs outweigh the benefits for starting TPN. Though she already has a port for access, TPN would expose her to unnecessary infections while she is very ill.   Since her GI system is functional, would recommend the placement of a PEG tube for long-term nutrition needs. Regarding feeding tubes, per previous RD notes, pt mentioned to [place] RD that she would not be interested in a feeding tube - prior to the AMS.  RD to continue to follow per protocol.  Derrel Nip, RD, LDN Registered Dietitian After Hours/Weekend Pager # in [place]

## 2021-01-30 NOTE — Progress Notes (Signed)
HEMATOLOGY-ONCOLOGY PROGRESS NOTE  SUBJECTIVE: The patient declined rapidly over the weekend with altered mental status.  She is very lethargic, not responsive when I saw her around noon.  Her daughter was at the bedside.  She has not eating anything today, she is on IV fluids.   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 [facility]) Staging form: Breast, AJCC 7th Edition - Clinical: Stage IIA (T2, N0, M0) - Signed by [name], MD on 02/08/2015 Laterality: Right Estrogen receptor status: Positive Progesterone receptor status: Positive HER2 status: Negative - Pathologic stage from 03/23/2015: Stage Unknown (T2, NX, cM0) - Unsigned  Small cell lung cancer ([place]) Staging form: Lung, AJCC 8th Edition - Clinical stage from 11/13/2020: Stage IV (cTX, cN3, cM1) - Signed by [name], MD on 11/16/2020 Stage prefix: Initial diagnosis     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) and adjuvant radiation ([name]).   12/13/2014 Mammogram   Right breast: possible asymmetry warranting further evaluation with spot compression views and possibly ultrasound   12/16/2014 Breast US   Right breast: no definitive abnormality within the superior aspect of the right breast to correspond with the mammographic finding.   01/10/2015 Breast MRI   Right breast: irregular rim enhancing mass within the superior right breast at 11:30 measuring 2.5 x 2.4 x 2.4 cm. No abnormal enhancement is identified within the skin of the superior right breast in the area of concern on mammogram.   01/12/2015 Breast US   Second look diagnostic mammogram and ultrasound showed a 2.3 cm mass in the upper midline right breast. No axillary  adenopathy.   01/12/2015 Initial Biopsy   Right breast needle core bx (upper midline): Invasive ductal carcinoma, grade 2, ER+ (100%), PR+ (77%), HER2/neu negative (ratio 1.15), Ki67 20%    01/30/2015 Procedure   Breast High/Moderate Risk panel ([facility]) reveals no clinically significant variant at ATM, BRCA1, BRCA2, CDH1, CHEK2, PALB2, PTEN, STK11, and TP53.   02/08/2015 Clinical Stage   Stage IIA (T2 N0)   03/23/2015 Definitive Surgery   Right mastectomy ([name]): invasive adenocarcinoma, grade 3, 2.9 cm, HER2/neu repeated, negative (ratio 1.23)   03/23/2015 Oncotype testing   Score: 8 (6% ROR). No chemotherapy [name]).   03/23/2015 Pathologic Stage   Stage IIA: pT2 pNx   05/04/2015 -  Anti-estrogen oral therapy   Letrozole 2.56m daily. Planned duration of therapy at least 5 years.    07/25/2015 Survivorship   Survivorship visit completed and copy of care plan provided to patient.   12/15/2015 Mammogram   IMPRESSION: No mammographic evidence of malignancy. A result letter of this screening mammogram will be mailed directly to the patient.   12/23/2016 Mammogram   IMPRESSION: No mammographic evidence of malignancy. A result letter of this screening mammogram will be mailed directly to the patient.    12/30/2017 Mammogram   12/30/2017 Mammogram IMPRESSION: No mammographic evidence of malignancy. A result letter of this screening mammogram will be mailed directly to the patient.   03/27/2020 Mammogram   IMPRESSION: No mammographic evidence of malignancy. A result letter of this screening mammogram will be mailed directly to the patient.   10/26/2020 Imaging   CT chest IMPRESSION: 1. Bulky mediastinal, thoracic inlet, right supraclavicular, and left axillary lymphadenopathy consistent with  metastatic disease. Associated numerous bilateral pulmonary nodules and masses, most prominently in the right middle lobe with dominant 4.1 cm peripheral mass lesion. 2. 16 mm rim enhancing  lesion posterior right liver consistent with metastatic involvement. 3. 13 mm hypoenhancing lesion in the tail of pancreas. Primary neoplasm or metastatic involvement could have this appearance. 4. Multiple nodules in the upper abdomen concerning for peritoneal/mesenteric metastatic disease. 5. Small right and tiny left pleural effusions with bibasilar atelectasis. 6. Aortic Atherosclerosis (ICD10-I70.0).   11/10/2020 PET scan   IMPRESSION: 1. Extensive hypermetabolic multi organ metastatic disease. 2. Bulky intensely hypermetabolic RIGHT supraclavicular, mediastinal and LEFT axillary adenopathy. 3. Hypermetabolic pulmonary mass in the RIGHT upper lobe with additional hypermetabolic nodules. 4. Hypermetabolic solitary hepatic metastasis. 5. Hypermetabolic lesion within the pancreas is also favored metastatic lesion. 6. Multifocal hypermetabolic skeletal metastasis. 7. Bilateral pleural effusions.   11/13/2020 Procedure   Thoracentesis  IMPRESSION: Successful ultrasound guided right thoracentesis yielding 200 mL of pleural fluid.   Small cell lung cancer ([place])  11/13/2020 Pathology Results   Right supraclavicular LN Biopsy  FINAL MICROSCOPIC DIAGNOSIS:   A. LYMPH NODE, RIGHT SUPRACLAVICULAR, NEEDLE CORE BIOPSY:  - Most consistent with small cell carcinoma.   COMMENT:   Tumor is limited in quantity, and partially necrotic.  TTF-1 and  Synaptophysin are positive.  Ki-67 proliferation index reaches 80-90%.  GATA-3, ER and p40 are negative.  The morphologic and immunophenotypic  characteristics are most suggestive of small cell carcinoma.  TTF-1  positive staining is compatible with origin from the lung; however, it  does not exclude origin from other entities.  Please note that distinct  nodal tissue is not identified in the submitted material.  Results  reported to Dr. [name] on 11/16/2020.  Dr. Melina Copa reviewed the case   11/13/2020 Cancer Staging   Staging form: Lung,  AJCC 8th Edition - Clinical stage from 11/13/2020: Stage IV (cTX, cN3, cM1) - Signed by [name], MD on 11/16/2020 Stage prefix: Initial diagnosis   11/13/2020 - 11/28/2020 Radiation Therapy   palliative radiation to [facility] with Dr Lisbeth Renshaw 11/13/20-11/28/20   11/16/2020 Initial Diagnosis   Small cell lung cancer ([place])   11/22/2020 -  Chemotherapy   First line etoposide D1-3, carboplatin and atezolizumab D1 q3weeks beginning 11/22/20.        11/29/2020 Procedure   PAC placed on 11/29/20   01/01/2021 Imaging   IMPRESSION: 1. Significant interval decrease in size of the right middle lobe lung mass and significant interval decrease in size of the mediastinal and right hilar lymph nodes. 2. Resolution of right supraclavicular and left axillary adenopathy. 3. Stable miliary pattern of pulmonary nodules. No new or progressive findings. 4. Stable diffuse sclerotic metastatic bone disease. No findings for progression. 5. Interval decrease in size of the pancreatic mass and adjacent lymph node. 6. Slightly smaller segment 7 liver lesion. No new or progressive findings. 7. Emphysema and aortic atherosclerosis.   Aortic Atherosclerosis (ICD10-I70.0) and Emphysema (ICD10-J43.9).        REVIEW OF SYSTEMS:   Not able to obtain due to her altered mental status  PHYSICAL EXAMINATION:  Vitals:   01/30/21 0952 01/30/21 1250  BP: (!) 146/129   Pulse: (!) 129   Resp:  (!) 24  Temp: (!) 97 F (36.1 C)   SpO2: 99%    Filed Weights   01/25/21 0530 01/26/21 0700 01/27/21 0500  Weight: 109 lb 12.6 oz (49.8 kg) 109 lb 2 oz (49.5 kg) 107 lb 5.8 oz (48.7  kg)    Intake/Output from previous day: 04/18 0701 - 04/19 0700 In: -  Out: 1800 [Urine:1800]  GENERAL: Lethargic, does not answer my questions SKIN: skin color, texture, turgor are normal, no rashes or significant lesions, no petechia or ecchymosis EYES: normal, Conjunctiva are pink and non-injected, sclera clear ABDOMEN:abdomen soft with  normal bowel sounds EXT: no edema   LABORATORY DATA:  I have reviewed the data as listed CMP Latest Ref Rng & Units 01/30/2021 02/01/2021 01/28/2021  Glucose 70 - 99 mg/dL 101(H) 104(H) 102(H)  BUN 8 - 23 mg/dL '10 11 11  ' Creatinine 0.44 - 1.00 mg/dL 0.61 0.75 0.73  Sodium 135 - 145 mmol/L 150(H) 144 142  Potassium 3.5 - 5.1 mmol/L 3.8 4.2 4.2  Chloride 98 - 111 mmol/L 122(H) 115(H) 114(H)  CO2 22 - 32 mmol/L 17(L) 20(L) 20(L)  Calcium 8.9 - 10.3 mg/dL 8.8(L) 8.9 8.7(L)  Total Protein 6.5 - 8.1 g/dL - - -  Total Bilirubin 0.3 - 1.2 mg/dL - - -  Alkaline Phos 38 - 126 U/L - - -  AST 15 - 41 U/L - - -  ALT 0 - 44 U/L - - -    Lab Results  Component Value Date   WBC 2.6 (L) 01/30/2021   HGB 8.2 (L) 01/30/2021   HCT 26.0 (L) 01/30/2021   MCV 86.7 01/30/2021   PLT 120 (L) 01/30/2021   NEUTROABS 1.4 (L) 01/13/2021    DG Chest 2 View  Result Date: 02/06/2021 CLINICAL DATA:  Dysphagia and chest pain with nausea and gagging as well as vomiting 3 weeks. History of metastatic small cell lung cancer with last chemotherapy 2 weeks ago. EXAM: CHEST - 2 VIEW COMPARISON:  11/13/2020 and CT 01/01/2021 FINDINGS: Right IJ Port-A-Cath has tip over the SVC. Lungs are adequately inflated without focal airspace consolidation or effusion. Cardiomediastinal silhouette is normal. Stable moderate sclerotic compression fractures over the thoracic and lumbar spine compared to 01/01/2021, although these are new compared to 10/26/2020. IMPRESSION: 1.  No acute cardiopulmonary disease. 2. Several moderate sclerotic compression fractures of the thoracic and lumbar spine new since January 2022 but unchanged from 01/01/2021 likely metastatic in origin. Electronically Signed   By: Marin Olp M.D.   On: 01/19/2021 14:58   MR BRAIN W WO CONTRAST  Result Date: 01/28/2021 CLINICAL DATA:  Metastatic disease evaluation. EXAM: MRI HEAD WITHOUT AND WITH CONTRAST TECHNIQUE: Multiplanar, multiecho pulse sequences of the brain  and surrounding structures were obtained without and with intravenous contrast. CONTRAST:  49m GADAVIST GADOBUTROL 1 MMOL/ML IV SOLN COMPARISON:  Brain MRI 12/01/2020. FINDINGS: Brain: Multiple sequences are significantly motion degraded, limiting evaluation. Most notably, there is moderate/severe motion degradation of the axial T2/FLAIR sequence moderate/severe motion degradation of the axial T1 weighted postcontrast sequence and moderate/severe motion degradation of the sagittal T1 weighted postcontrast sequence. Mild cerebral and cerebellar atrophy. Mild multifocal T2/FLAIR hyperintensity within the cerebral white matter is nonspecific, but compatible with chronic small vessel ischemic disease. Within the limitations of motion degradation, no enhancing intracranial metastatic disease is identified. There is no acute infarct. No evidence of intracranial mass. No appreciable chronic intracranial blood products. No extra-axial fluid collection. No midline shift. Vascular: Expected proximal arterial flow voids. Skull and upper cervical spine: Redemonstrated scattered enhancing calvarial lesions. As before, there is a lesion in the low right occipital calvarium, encircling the hypoglossal canal (without clear canal infiltration). Unchanged dominant 18 mm lesion within the right parietal calvarium. This lesion involves the inner table  without visible underlying dural thickening. Sinuses/Orbits: Visualized orbits show no acute finding. T2 hyperintense opacification of an anterior right ethmoid air cell. Other: Right mastoid IMPRESSION: Multiple sequences are significantly motion degraded (including some of the acquired post-contrast imaging), as described and limiting evaluation. Unchanged osseous metastatic disease, most notably at the right skull base (where a lesion encircles the right hypoglossal canal), and within the right parietal calvarium. As before, this dominant right parietal calvarial lesion involves the  inner table, but there is no appreciable underlying dural thickening. Within described limitations, no enhancing brain metastases are identified. Right ethmoid sinusitis. Right mastoid effusion. Electronically Signed   By: Kellie Simmering DO   On: 01/28/2021 19:58   CT CHEST ABDOMEN PELVIS W CONTRAST  Result Date: 01/02/2021 CLINICAL DATA:  Small cell lung cancer. History of chemotherapy and radiation therapy. EXAM: CT CHEST, ABDOMEN, AND PELVIS WITH CONTRAST TECHNIQUE: Multidetector CT imaging of the chest, abdomen and pelvis was performed following the standard protocol during bolus administration of intravenous contrast. CONTRAST:  13m OMNIPAQUE IOHEXOL 300 MG/ML  SOLN COMPARISON:  CT abdomen/pelvis 11/28/2020 and PET-CT 11/10/2020 FINDINGS: CT CHEST FINDINGS Cardiovascular: The heart is normal in size. No pericardial effusion. Stable tortuosity and calcification of the thoracic aorta and stable scattered coronary artery calcifications. The right IJ Port-A-Cath is in good position without complicating features. Mediastinum/Nodes: Much improved mediastinal and right hilar adenopathy. Anterior mediastinal nodal mass measures 20 x 12 mm on image 23/2. This previously measured 4.1 x 3.5 cm. Subcarinal node measures 9.5 mm on image 27/2. This previously measured 20 mm. On the prior PET-CT there is a large right supraclavicular nodal mass. I do not see any residual measurable lymphadenopathy. The hypermetabolic left axillary adenopathy seen on the prior PET-CT has resolved. The esophagus is grossly normal. Lungs/Pleura: The peripheral right middle lobe lung mass is much smaller. It measures approximately 4.2 x 2.0 cm on the prior CT scan and now measures approximately 2.3 x 1.0 cm. Persistent miliary pattern of pulmonary nodules throughout both lungs. No new lung lesions. No pleural effusions. Musculoskeletal: Stable appearing diffuse sclerotic T7 and T10 pathologic compression fractures are again noted. Do not see  any obvious new or progressive metastatic bone disease. Metastatic disease CT ABDOMEN PELVIS FINDINGS Hepatobiliary: 1.7 x 1.4 cm segment 7 liver lesion previously measured approximately 2.3 x 1.9 cm. No new hepatic metastatic lesions. The gallbladder is unremarkable.  No common bile duct dilatation. Pancreas: The 17 mm pancreatic mass in the body tail junction region now measures approximately 7 mm. There was an adjacent lymph node which measured measured 9 mm and this is no longer identified. Spleen: Normal size.  No focal lesions. Adrenals/Urinary Tract: The adrenal glands and kidneys are unremarkable. No evidence of adrenal gland metastatic disease. The bladder is unremarkable. Stomach/Bowel: The stomach, duodenum, small bowel and colon are unremarkable. Vascular/Lymphatic: Stable atherosclerotic calcifications involving the aorta and branch vessels. No aneurysm or dissection. The major venous structures are patent. No mesenteric or retroperitoneal mass or lymphadenopathy. Reproductive: The uterus and ovaries are unremarkable. Other: No pelvic mass or free pelvic fluid collections. No inguinal mass or adenopathy. Musculoskeletal: Stable severe degenerative changes involving the left hip. Stable scattered sclerotic metastatic bone disease without findings for progression. IMPRESSION: 1. Significant interval decrease in size of the right middle lobe lung mass and significant interval decrease in size of the mediastinal and right hilar lymph nodes. 2. Resolution of right supraclavicular and left axillary adenopathy. 3. Stable miliary pattern of pulmonary nodules. No  new or progressive findings. 4. Stable diffuse sclerotic metastatic bone disease. No findings for progression. 5. Interval decrease in size of the pancreatic mass and adjacent lymph node. 6. Slightly smaller segment 7 liver lesion. No new or progressive findings. 7. Emphysema and aortic atherosclerosis. Aortic Atherosclerosis (ICD10-I70.0) and Emphysema  (ICD10-J43.9). Electronically Signed   By: Marijo Sanes M.D.   On: 01/02/2021 12:28   DG CHEST PORT 1 VIEW  Result Date: 01/28/2021 CLINICAL DATA:  Pneumonia EXAM: PORTABLE CHEST 1 VIEW COMPARISON:  02/09/2021 FINDINGS: Stable mild elevation of left hemidiaphragm. No consolidation. No pleural effusion or pneumothorax. Stable cardiomediastinal contours. Right chest wall port catheter is unchanged. Surgical clips overlie the right axilla and breast. IMPRESSION: Lungs are clear by radiograph. Findings on prior chest CT may have improved or are not visible. Electronically Signed   By: Macy Mis M.D.   On: 01/15/2021 08:31   US Abdomen Limited RUQ (LIVER/GB)  Result Date: 01/15/2021 CLINICAL DATA:  Elevated LFTs EXAM: ULTRASOUND ABDOMEN LIMITED RIGHT UPPER QUADRANT COMPARISON:  None. FINDINGS: Gallbladder: No gallstones or wall thickening visualized. No sonographic Murphy sign noted by sonographer. Common bile duct: Diameter: Normal at 1.5 mm Liver: Small subtly hypoechoic solid lesion in the posterosuperior aspect of the right liver measures 1.2 x 1.1 cm. Hepatic parenchymal echogenicity and echotexture within normal limits. Portal vein is patent on color Doppler imaging with normal direction of blood flow towards the liver. Other: None. IMPRESSION: 1. Small nonspecific solid 1.2 cm hypoechoic lesion in the posterosuperior aspect of the right liver. Differential considerations are broad and include both malignant and benign neoplastic processes. Recommend further evaluation with gadolinium-enhanced MRI of the abdomen. 2. Otherwise, unremarkable right upper quadrant ultrasound. Electronically Signed   By: Jacqulynn Cadet M.D.   On: 01/15/2021 06:37    ASSESSMENT AND PLAN: 1.  Extensive stage small cell lung cancer 2.  Pancytopenia secondary to chemotherapy 3.  Dysphagia, odynophagia, secondary to radiation induced severe esophageal stricture  4.  Transaminitis and hyperbilirubinemia, improved  5.   Failure of thrive 6.  Hypothyroidism 7.  History of breast cancer 8. DNR  9.  Metabolic encephalopathy  -Patient is clearly terminal, with altered mental status and lethargy.  She has stopped eating and drinking.  She would likely pass away in the next few days to a week. -I discussed with her daughter at the bedside, I recommend residential hospice, will stay in the hospital until she passes away.  Daughter agrees with comfort care.  I encouraged her to bring another family members in to say good-by to her. Her son lives in [place] -I greatly appreciate the primary team and palliative medicine, I greatly appreciate their assistance and support.  -pt is DNR.   [name]  01/30/2021

## 2021-01-30 NOTE — Plan of Care (Signed)
  Problem: Clinical Measurements: Goal: Respiratory complications will improve Outcome: Progressing Goal: Cardiovascular complication will be avoided Outcome: Progressing   Problem: Coping: Goal: Level of anxiety will decrease Outcome: Progressing   Problem: Elimination: Goal: Will not experience complications related to bowel motility Outcome: Progressing Goal: Will not experience complications related to urinary retention Outcome: Progressing   Problem: Pain Managment: Goal: General experience of comfort will improve Outcome: Progressing   Problem: Safety: Goal: Ability to remain free from injury will improve Outcome: Progressing   Problem: Skin Integrity: Goal: Risk for impaired skin integrity will decrease Outcome: Progressing

## 2021-01-30 NOTE — Progress Notes (Signed)
Manufacturing engineer [facility]) Hospital Liaison note.    Received request from [place] for family interest in [facility]. Chart and pt information under review by [facility] physician.  Hospice eligibility pending at this time.  [facility] is unable to offer a room today. Hospital Liaison will follow up tomorrow or sooner if a room becomes available. Please do not hesitate to call with questions.    Thank you for the opportunity to participate in this patient's care.  Domenic Moras, BSN, RN [facility] Liaison (listed on [facility] under Hospice/[facility])    [phone] (24h on call)

## 2021-01-30 NOTE — Progress Notes (Addendum)
PROGRESS NOTE    Tara Rodgers    Code Status: DNR  MGQ:[id number] DOB: 02-01-1946 DOA: 02/06/2021 LOS: 15 days  PCP: Elby Showers, MD CC:  Chief Complaint  Patient presents with  . Emesis  . Nausea  . Dysphagia       Hospital Summary   This is a 75 year old female with past medical history of metastatic SCLC undergoing chemotherapy s/p XRT, transfusion dependent anemia, thrombocytopenia, right breast CA s/p mastectomy in 2016 on antiestrogen therapy who presented to the ED on 4/3 with complaints of 3 weeks of constant, worsening and severe dysphagia with globus sensation and odynophagia which limited her oral intake.  ED course: Tachycardic, afebrile, normotensive.  Ketones in urine, O6.7, metabolic acidosis and LFTs elevated.  4/17: Confused and unable to recognize her family.  Brain MRI with and without contrast- noted osseous metastatic disease however significant motion artifact 4/18: Still encephalopathic.  GI held off on planned esophageal stent.  Palliative care consulted and patient was made DNR 4/19: Continue Protonix twice daily.  Holding off on any procedures at this point.  Oncology recommending residential hospice.    A & P   Active Problems:   History of malignant neoplasm of breast   Hypothyroidism   Small cell lung cancer ([place])   Port-A-Cath in place   Dysphagia   Malnutrition of moderate degree   Pressure injury of skin   Chemotherapy-induced neutropenia (HCC)   1. Failure to thrive   Acute encephalopathy, likely metabolic and toxic secondary to UTI, poor p.o. intake and end-stage cancer, hypernatremia a. Continue antibiotics for now b. Palliative care on board with plans for discharge to residential hospice when bed is available and continue gentle IV fluids c. Change NS KCl to 1/2 NS with KCl due to Hypernatremia  2. Dysphagia  odynophagia a. EGD on 4/13: Benign-appearing esophageal stenosis with radiation esophagitis with bleeding, acute gastritis  and radiation-induced mild esophageal stricture b. Magic mouthwash and nystatin c. Carafate d. GI holding off on esophageal stenting now given her poor prognosis and dispo  3. Pancytopenia secondary to chemotherapy  stage IV small cell lung cancer a. Per hematology/oncology and palliative care  4. History of breast cancer  5. Hypertension and tachycardia a. Continue gentle IV fluids b. PRN lopressor  6. Hypothyroidism a. Currently getting Synthroid IV   DVT prophylaxis:    Family Communication: Patient's daughter has been updated   Disposition Plan:  Status is: Inpatient  Remains inpatient appropriate because:Unsafe d/c plan   Dispo: The patient is from: Home              Anticipated d/c is to: residential hospice              Patient currently is not medically stable to d/c.   Difficult to place patient No           Pressure injury documentation   Pressure Injury 01/18/21 Sacrum Mid Stage 2 -  Partial thickness loss of dermis presenting as a shallow open injury with a red, pink wound bed without slough. small, round (Active)  01/18/21 2130  Location: Sacrum  Location Orientation: Mid  Staging: Stage 2 -  Partial thickness loss of dermis presenting as a shallow open injury with a red, pink wound bed without slough.  Wound Description (Comments): small, round  Present on Admission:     Consultants  GI Oncology Palliative  Procedures  None  Antibiotics   Anti-infectives (From admission, onward)   Start  Dose/Rate Route Frequency Ordered Stop   02/05/2021 1830  cefTRIAXone (ROCEPHIN) 1 g in sodium chloride 0.9 % 100 mL IVPB        1 g 200 mL/hr over 30 Minutes Intravenous Daily-1800 01/31/2021 1803     01/16/21 1000  fluconazole (DIFLUCAN) IVPB 200 mg        200 mg 100 mL/hr over 60 Minutes Intravenous Daily 01/15/21 1024     01/15/21 1300  cefTRIAXone (ROCEPHIN) 1 g in sodium chloride 0.9 % 100 mL IVPB  Status:  Discontinued        1 g 200 mL/hr  over 30 Minutes Intravenous Every 24 hours 01/15/21 1210 01/21/21 1210   01/15/21 1100  fluconazole (DIFLUCAN) IVPB 400 mg        400 mg 100 mL/hr over 120 Minutes Intravenous  Once 01/15/21 1024 01/15/21 1326        Subjective   Seen and examined at bedside. Appears confused but shakes her head no when asked if any issues. Daughter at bedside, all questions answered.   Objective   Vitals:   01/30/21 0626 01/30/21 0952 01/30/21 1242 01/30/21 1250  BP: (!) 143/109 (!) 146/129    Pulse: (!) 130 (!) 129    Resp:    (!) 24  Temp: 97.6 F (36.4 C) (!) 97 F (36.1 C)    TempSrc: Oral  Oral   SpO2: 100% 99%    Weight:      Height:        Intake/Output Summary (Last 24 hours) at 01/30/2021 1716 Last data filed at 01/30/2021 1500 Gross per 24 hour  Intake 1667.42 ml  Output 1400 ml  Net 267.42 ml   Filed Weights   01/25/21 0530 01/26/21 0700 01/27/21 0500  Weight: 49.8 kg 49.5 kg 48.7 kg    Examination:  Physical Exam Vitals and nursing note reviewed.  Constitutional:      General: She is not in acute distress.    Comments: Awake  HENT:     Head: Normocephalic.     Mouth/Throat:     Mouth: Mucous membranes are moist.  Eyes:     Conjunctiva/sclera: Conjunctivae normal.  Cardiovascular:     Rate and Rhythm: Regular rhythm. Tachycardia present.  Pulmonary:     Effort: Pulmonary effort is normal.     Breath sounds: Normal breath sounds.  Abdominal:     General: Abdomen is flat. There is no distension.  Musculoskeletal:        General: No swelling or tenderness.  Skin:    General: Skin is warm.     Coloration: Skin is not jaundiced.  Neurological:     Mental Status: She is disoriented.     Data Reviewed: I have personally reviewed following labs and imaging studies  CBC: Recent Labs  Lab 01/25/2021 0610 01/25/21 0512 01/26/21 0528 01/28/21 2238 01/13/2021 0554 01/30/21 1140  WBC 1.5* 1.4* 1.4* 2.2* 2.5* 2.6*  NEUTROABS 0.7* 0.7* 0.8*  --  1.4*  --   HGB  8.3* 7.9* 8.0* 8.4* 8.4* 8.2*  HCT 25.1* 24.0* 23.9* 26.1* 26.0* 26.0*  MCV 82.8 83.0 82.1 84.5 84.4 86.7  PLT 65* 79* 96* 136* 146* 761*   Basic Metabolic Panel: Recent Labs  Lab 02/07/2021 0610 01/15/2021 0726 01/25/21 0512 01/26/21 0528 01/27/21 0534 01/28/21 2238 01/28/2021 0543 02/09/2021 0554 01/30/21 1140  NA  --    < > 134* 134* 139  138 142 144  --  150*  K  --    < >  3.0* 3.0* 3.0*  3.0* 4.2 4.2  --  3.8  CL  --    < > 105 105 107  106 114* 115*  --  122*  CO2  --    < > 19* 19* 19*  20* 20* 20*  --  17*  GLUCOSE  --    < > 102* 100* 119*  121* 102* 104*  --  101*  BUN  --    < > 7* 6* 9  9 11 11   --  10  CREATININE  --    < > 0.51 0.57 0.62  0.62 0.73 0.75  --  0.61  CALCIUM  --    < > 8.0* 7.9* 8.6*  8.7* 8.7* 8.9  --  8.8*  MG 1.9  --  1.5*  --  2.0  --   --  1.6*  --   PHOS  --   --  3.2  --  3.8  --  3.4  --   --    < > = values in this interval not displayed.   GFR: Estimated Creatinine Clearance: 42.7 mL/min (by C-G formula based on SCr of 0.61 mg/dL). Liver Function Tests: Recent Labs  Lab 01/25/21 0512 01/27/21 0534 01/18/2021 0543  AST 21  --   --   ALT 38  --   --   ALKPHOS 80  --   --   BILITOT 1.6*  --   --   PROT 5.4*  --   --   ALBUMIN 3.2* 3.3* 3.1*   No results for input(s): LIPASE, AMYLASE in the last 168 hours. No results for input(s): AMMONIA in the last 168 hours. Coagulation Profile: No results for input(s): INR, PROTIME in the last 168 hours. Cardiac Enzymes: No results for input(s): CKTOTAL, CKMB, CKMBINDEX, TROPONINI in the last 168 hours. BNP (last 3 results) No results for input(s): PROBNP in the last 8760 hours. HbA1C: No results for input(s): HGBA1C in the last 72 hours. CBG: Recent Labs  Lab 01/28/21 1906 01/28/21 2057  GLUCAP 117* 103*   Lipid Profile: No results for input(s): CHOL, HDL, LDLCALC, TRIG, CHOLHDL, LDLDIRECT in the last 72 hours. Thyroid Function Tests: No results for input(s): TSH, T4TOTAL, FREET4,  T3FREE, THYROIDAB in the last 72 hours. Anemia Panel: No results for input(s): VITAMINB12, FOLATE, FERRITIN, TIBC, IRON, RETICCTPCT in the last 72 hours. Sepsis Labs: No results for input(s): PROCALCITON, LATICACIDVEN in the last 168 hours.  Recent Results (from the past 240 hour(s))  Culture, blood (routine x 2)     Status: None (Preliminary result)   Collection Time: 02/10/2021  8:17 AM   Specimen: BLOOD  Result Value Ref Range Status   Specimen Description   Final    BLOOD BLOOD LEFT FOREARM Performed at [facility] [address] [place], [place] [address]    Special Requests   Final    BOTTLES DRAWN AEROBIC ONLY Blood Culture results may not be optimal due to an inadequate volume of blood received in culture bottles Performed at [facility] [address] [place] [address] [place], [place] [address]    Culture   Final    NO GROWTH < 24 HOURS Performed at [facility] [address] [place], [place] [address]    Report Status PENDING  Incomplete  Culture, blood (routine x 2)     Status: None (Preliminary result)   Collection Time: 02/07/2021 10:08 AM   Specimen: BLOOD  Result Value Ref  Range Status   Specimen Description   Final    BLOOD LEFT HAND Performed at [facility] [address] [place], [place] [address]    Special Requests   Final    BOTTLES DRAWN AEROBIC ONLY Blood Culture adequate volume Performed at [facility] [address] [place], [place] [address]    Culture   Final    NO GROWTH < 24 HOURS Performed at [facility] [address], [place], [place] [address]    Report Status PENDING  Incomplete         Radiology Studies: MR BRAIN W WO CONTRAST  Result Date: 01/28/2021 CLINICAL DATA:  Metastatic disease evaluation. EXAM: MRI HEAD WITHOUT AND WITH CONTRAST TECHNIQUE: Multiplanar, multiecho pulse sequences of the brain and surrounding structures were obtained without  and with intravenous contrast. CONTRAST:  52mL GADAVIST GADOBUTROL 1 MMOL/ML IV SOLN COMPARISON:  Brain MRI 12/01/2020. FINDINGS: Brain: Multiple sequences are significantly motion degraded, limiting evaluation. Most notably, there is moderate/severe motion degradation of the axial T2/FLAIR sequence moderate/severe motion degradation of the axial T1 weighted postcontrast sequence and moderate/severe motion degradation of the sagittal T1 weighted postcontrast sequence. Mild cerebral and cerebellar atrophy. Mild multifocal T2/FLAIR hyperintensity within the cerebral white matter is nonspecific, but compatible with chronic small vessel ischemic disease. Within the limitations of motion degradation, no enhancing intracranial metastatic disease is identified. There is no acute infarct. No evidence of intracranial mass. No appreciable chronic intracranial blood products. No extra-axial fluid collection. No midline shift. Vascular: Expected proximal arterial flow voids. Skull and upper cervical spine: Redemonstrated scattered enhancing calvarial lesions. As before, there is a lesion in the low right occipital calvarium, encircling the hypoglossal canal (without clear canal infiltration). Unchanged dominant 18 mm lesion within the right parietal calvarium. This lesion involves the inner table without visible underlying dural thickening. Sinuses/Orbits: Visualized orbits show no acute finding. T2 hyperintense opacification of an anterior right ethmoid air cell. Other: Right mastoid IMPRESSION: Multiple sequences are significantly motion degraded (including some of the acquired post-contrast imaging), as described and limiting evaluation. Unchanged osseous metastatic disease, most notably at the right skull base (where a lesion encircles the right hypoglossal canal), and within the right parietal calvarium. As before, this dominant right parietal calvarial lesion involves the inner table, but there is no appreciable underlying  dural thickening. Within described limitations, no enhancing brain metastases are identified. Right ethmoid sinusitis. Right mastoid effusion. Electronically Signed   By: Kellie Simmering DO   On: 01/28/2021 19:58   DG CHEST PORT 1 VIEW  Result Date: 02/09/2021 CLINICAL DATA:  Pneumonia EXAM: PORTABLE CHEST 1 VIEW COMPARISON:  02/05/2021 FINDINGS: Stable mild elevation of left hemidiaphragm. No consolidation. No pleural effusion or pneumothorax. Stable cardiomediastinal contours. Right chest wall port catheter is unchanged. Surgical clips overlie the right axilla and breast. IMPRESSION: Lungs are clear by radiograph. Findings on prior chest CT may have improved or are not visible. Electronically Signed   By: Macy Mis M.D.   On: 02/06/2021 08:31        Scheduled Meds: . Chlorhexidine Gluconate Cloth  6 each Topical Daily  . fentaNYL  1 patch Transdermal Q72H  . levothyroxine  50 mcg Intravenous Daily  . mouth rinse  15 mL Mouth Rinse BID  . metoprolol tartrate  5 mg Intravenous Q6H  . multivitamin with minerals  1 tablet Oral Daily  . nystatin  5 mL Oral QID  . pantoprazole (PROTONIX) IV  40  mg Intravenous Q12H  . polyethylene glycol  17 g Oral Daily  . sodium chloride flush  10-40 mL Intracatheter Q12H  . sodium chloride flush  3 mL Intravenous Q12H  . sucralfate  1 g Oral TID WC & HS   Continuous Infusions: . 0.9 % NaCl with KCl 40 mEq / L 100 mL/hr at 01/30/21 0642  . cefTRIAXone (ROCEPHIN)  IV 1 g (01/28/2021 2135)  . fluconazole (DIFLUCAN) IV 200 mg (01/30/21 1057)     Time spent: 29 minutes with over 50% of the time coordinating the patient's care    Harold Hedge, DO [facility]   Call night coverage person covering after 7pm

## 2021-01-30 NOTE — Progress Notes (Signed)
PT Cancellation Note  Patient Details Name: Tara Rodgers MRN: [id number] DOB: 1946/01/20   Cancelled Treatment:    Reason Eval/Treat Not Completed: Patient declined, no reason specified;Other (comment) (Pt declining; family meeting with palliative care today to discuss care goals. If pt improves will follow up for therapy to improve QOL/mobility, will hold at this time and sign off if comfort measures is determined most appropriate.)   Verner Mould, DPT [place] Office [phone] Pager [phone]

## 2021-01-30 NOTE — Progress Notes (Signed)
OT Cancellation Note  Patient Details Name: TERECIA PLAUT MRN: [id number] DOB: 03/26/1946   Cancelled Treatment:    Reason Eval/Treat Not Completed: Fatigue/lethargy limiting ability to participate. Per daughter patient not alert enough to participate in therapy at this time. Per Rn family having a meeting with palliative today to decide GOC. Will f/u as able and if inline with GOC.  Daris Harkins L Maiya Kates 01/30/2021, 8:57 AM

## 2021-01-30 NOTE — TOC Progression Note (Signed)
Transition of Care [facility]) - Progression Note    Patient Details  Name: Tara Rodgers MRN: [id number] Date of Birth: [birth year]  [facility] CM/SW Contact  Joaquin Courts, RN Phone Number: 01/30/2021, 2:59 PM  Clinical Narrative:    Referral for residential hospice given to [place].    Expected Discharge Plan: [place] Barriers to Discharge: Continued Medical Work up  Expected Discharge Plan and Services Expected Discharge Plan: [place]   Discharge Planning Services: CM Consult Post Acute Care Choice: [place] arrangements for the past 2 months: Single Family Home                 DME Arranged: [place] rolling with seat DME Agency: [employer] Date DME Agency Contacted: 01/18/21 Time DME Agency Contacted: [id number] Representative spoke with at DME Agency: [place]: RN,PT [place] Agency: [place] ([place]) Date [place]: 01/18/21 Time HH Agency Contacted: [phone] Representative spoke with at [place]: [place] ([place]) Interventions    Readmission Risk Interventions Readmission Risk Prevention Plan 01/18/2021  Transportation Screening Complete  PCP or Specialist Appt within 3-5 Days Complete  HRI or [place] Complete  Social Work Consult for [place] Planning/Counseling Complete  Palliative Care Screening Not Applicable  Medication Review Press photographer) Complete  Some recent data might be hidden

## 2021-01-30 NOTE — Progress Notes (Signed)
Daily Progress Note   Patient Name: Tara Rodgers       Date: 01/30/2021 DOB: [birth year]  Age: 75 y.o. MRN#: [id number] Attending Physician: Harold Hedge, MD Primary Care Physician: Elby Showers, MD Admit Date: 02/01/2021  Reason for Consultation/Follow-up: Establishing goals of care  Subjective: I met today with Tara Rodgers, her daughter, and her sister.  Tara Rodgers is sleepy and does not really participate in conversation.  Her daughter tells me that she was able to speak with Dr. [name] and understands that her mother is approaching end of life.  They discussed hospice care and her daughter inquired about additional information.  She expressed that the most important things moving forward are that her mother be comfortable and that family be able to come and visit.  We discussed comfort care and how hospice can assist in these goals moving forward.  She reports that initially, she had wanted to work to take her mother home.  After discussion with Dr. [name], however, she feels that she may be better served at residential hospice.  Length of Stay: 15  Current Medications: Scheduled Meds:  . Chlorhexidine Gluconate Cloth  6 each Topical Daily  . fentaNYL  1 patch Transdermal Q72H  . levothyroxine  50 mcg Intravenous Daily  . mouth rinse  15 mL Mouth Rinse BID  . metoprolol tartrate  5 mg Intravenous Q6H  . multivitamin with minerals  1 tablet Oral Daily  . nystatin  5 mL Oral QID  . pantoprazole (PROTONIX) IV  40 mg Intravenous Q12H  . polyethylene glycol  17 g Oral Daily  . sodium chloride flush  10-40 mL Intracatheter Q12H  . sodium chloride flush  3 mL Intravenous Q12H  . sucralfate  1 g Oral TID WC & HS    Continuous Infusions: . 0.45 % NaCl with KCl 20 mEq / L 75 mL/hr at  01/30/21 1927  . cefTRIAXone (ROCEPHIN)  IV 1 g (01/30/21 1818)  . fluconazole (DIFLUCAN) IV 200 mg (01/30/21 1057)    PRN Meds: acetaminophen **OR** [DISCONTINUED] acetaminophen, antiseptic oral rinse, glycopyrrolate **OR** glycopyrrolate **OR** glycopyrrolate, haloperidol **OR** haloperidol **OR** haloperidol lactate, hydrALAZINE, LORazepam **OR** LORazepam **OR** LORazepam, magic mouthwash w/lidocaine, morphine injection, ondansetron (ZOFRAN) IV, polyvinyl alcohol, sodium chloride flush  Physical Exam  General: Alert, awake, in no acute distress.  Does not really participate in conversation. HEENT: No bruits, no goiter, no JVD Heart: Tachycardic. No murmur appreciated. Lungs: Fair air movement, clear Abdomen: Soft, nontender, nondistended, positive bowel sounds.  Ext: No significant edema Skin: Warm and dry Neuro: Not oriented.  Does not follow directions.   Vital Signs: BP (!) 146/129 (BP Location: Left Arm)   Pulse (!) 129   Temp (!) 97 F (36.1 C)   Resp (!) 24   Ht _0  (1.499 m)   Wt 48.7 kg   SpO2 99%   BMI 21.68 kg/m  SpO2: SpO2: 99 % O2 Device: O2 Device: Room Air O2 Flow Rate: O2 Flow Rate (L/min): 10 L/min  Intake/output summary:   Intake/Output Summary (Last 24 hours) at 01/30/2021 2145 Last data filed at 01/30/2021 1500 Gross per 24 hour  Intake 1667.42 ml  Output 1400 ml  Net 267.42 ml   LBM: Last BM Date: 01/22/21 Baseline Weight: Weight: 51 kg Most recent weight: Weight: 48.7 kg       Palliative Assessment/Data:      Patient Active Problem List   Diagnosis Date Noted  . Chemotherapy-induced neutropenia ([place])   . Pressure injury of skin 01/19/2021  . Malnutrition of moderate degree 01/16/2021  . Dysphagia 02/04/2021  . Port-A-Cath in place 12/04/2020  . Small cell lung cancer ([place]) 11/16/2020  . Goals of care, counseling/discussion 11/16/2020  . Sinus tachycardia 11/16/2019  . Left carotid bruit 11/16/2019  . Stiffness of right  knee 04/01/2018  . Osteoarthritis of knee 03/13/2018  . History of total knee arthroplasty 03/13/2018  . Genetic testing 02/13/2015  . Abnormal chromosomal and genetic finding on antenatal screening mother 02/13/2015  . 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 Hypertensive disorder 09/19/2012  . History of malignant neoplasm of breast 09/19/2012  . Asthma 09/19/2012  . Allergic rhinitis 09/19/2012  . Gastroesophageal reflux disease 09/19/2012  . Anxiety 09/19/2012  . Hyperlipidemia 09/19/2012  . Hypothyroidism 09/19/2012  . Osteoporosis 09/19/2012    Palliative Care Assessment & Plan   Recommendations/Plan:  DNR/DNI  Overall goal moving forward is comfort and family would like to pursue placement at residential hospice for end-of-life care.  We are continuing gentle fluids in order to support her until family can make it from out of town, however, family understands that there is going to come a point  in time where fluids contribute more to her discomfort.  We are also continuing antibiotics for urinary tract infection as UTI can certainly affect comfort.  Family understands that both of these will be stopped on transition to residential hospice.  Appreciate TOC assistance with referral to [facility] for end-of-life care  Goals of Care and Additional Recommendations:  Limitations on Scope of Treatment: Full Comfort Care  Code Status:    Code Status Orders  (From admission, onward)         Start     Ordered   01/30/21 1437  Do not attempt resuscitation (DNR)  Continuous       Question Answer Comment  In the event of cardiac or respiratory ARREST Do not call a "code blue"   In the event of cardiac or respiratory ARREST Do not perform Intubation, CPR, defibrillation or ACLS   In the event of cardiac or respiratory ARREST Use medication by any route, position, wound care, and other measures to relive pain and suffering. May use oxygen,  suction and manual treatment of  airway obstruction as needed for comfort.      01/30/21 1438        Code Status History    Date Active Date Inactive Code Status Order ID Comments User Context   01/15/2021 1938 01/30/2021 1438 DNR [id number]  Patrecia Pour, MD Inpatient   03/13/2018 1743 03/15/2018 1455 Full Code [id number]  Lajean Manes, PA-C Inpatient   Advance Care Planning Activity    Advance Directive Documentation   Flowsheet Row Most Recent Value  Type of Advance Directive Living will  Pre-existing out of facility DNR order (yellow form or pink MOST form) --  "MOST" Form in Place? --       Prognosis:   I agree with Dr. [name] that she would best be served by transition to residential hospice and that prognosis at this point is likely days to less than 2 weeks  Discharge Planning:  Hospice facility  Care plan was discussed with daughter  Thank you for allowing the Palliative Medicine Team to assist in the care of this patient.   Time In: 1400 Time Out: 1440 Total Time 40 Prolonged Time Billed No      Greater than 50%  of this time was spent counseling and coordinating care related to the above assessment and plan.  [name], MD  Please contact Palliative Medicine Team phone at [phone] for questions and concerns.

## 2021-01-30 NOTE — Progress Notes (Signed)
[facility] Gastroenterology Progress Note  Tara Rodgers 75 y.o. 1946/09/19  CC:  Dysphagia, esophageal stricture  Subjective: Patient somnolent, unable to provide subjective data.  ROS : Unable to obtain ROS due to somnolence  Objective: Vital signs in last 24 hours: Vitals:   01/30/21 0626 01/30/21 0952  BP: (!) 143/109 (!) 146/129  Pulse: (!) 130 (!) 129  Resp:    Temp: 97.6 F (36.4 C) (!) 97 F (36.1 C)  SpO2: 100% 99%    Physical Exam:  General:  Somnolent, chronically ill appearing  Head:  Normocephalic, without obvious abnormality, atraumatic  Lungs:   Respirations unlabored  Heart:  Tachycardic  Abdomen:   Soft, non-tender (no grimace with palpation), non-distended    Lab Results: Recent Labs    01/28/21 2238 01/31/2021 0543 01/22/2021 0554  NA 142 144  --   K 4.2 4.2  --   CL 114* 115*  --   CO2 20* 20*  --   GLUCOSE 102* 104*  --   BUN 11 11  --   CREATININE 0.73 0.75  --   CALCIUM 8.7* 8.9  --   MG  --   --  1.6*  PHOS  --  3.4  --    Recent Labs    02/02/2021 0543  ALBUMIN 3.1*   Recent Labs    01/28/21 2238 01/20/2021 0554  WBC 2.2* 2.5*  NEUTROABS  --  1.4*  HGB 8.4* 8.4*  HCT 26.1* 26.0*  MCV 84.5 84.4  PLT 136* 146*   No results for input(s): LABPROT, INR in the last 72 hours.    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  Continue Protonix BID.  [facility] will follow at a distance.  Please recall [place] sooner if needed.  Salley Slaughter PA-C 01/30/2021, 10:21 AM  Contact #  [phone]

## 2021-01-31 ENCOUNTER — Inpatient Hospital Stay: Payer: Medicare Other | Admitting: Hematology

## 2021-01-31 ENCOUNTER — Inpatient Hospital Stay: Payer: Medicare Other | Admitting: Nutrition

## 2021-01-31 ENCOUNTER — Inpatient Hospital Stay: Payer: Medicare Other

## 2021-01-31 DIAGNOSIS — Z7189 Other specified counseling: Secondary | ICD-10-CM | POA: Diagnosis not present

## 2021-01-31 DIAGNOSIS — C3490 Malignant neoplasm of unspecified part of unspecified bronchus or lung: Secondary | ICD-10-CM | POA: Diagnosis not present

## 2021-01-31 DIAGNOSIS — G893 Neoplasm related pain (acute) (chronic): Secondary | ICD-10-CM

## 2021-01-31 DIAGNOSIS — R1310 Dysphagia, unspecified: Secondary | ICD-10-CM | POA: Diagnosis not present

## 2021-01-31 MED ORDER — MORPHINE SULFATE (PF) 2 MG/ML IV SOLN
2.0000 mg | INTRAVENOUS | Status: DC | PRN
  Administered 2021-01-31 – 2021-02-06 (×11): 2 mg via INTRAVENOUS
  Filled 2021-01-31 (×12): qty 1

## 2021-01-31 NOTE — TOC Progression Note (Signed)
Transition of Care [facility]) - Progression Note    Patient Details  Name: Tara Rodgers MRN: [id number] Date of Birth: 1946/02/09  Transition of [facility]) CM/SW Contact  Ross Ludwig, [place] Phone Number: 01/31/2021, 11:38 AM  Clinical Narrative:     Patient has been approved for [facility], however there is not a bed available for today.  CSW to continue to follow patient's progress throughout discharge planning.   Expected Discharge Plan: [place] Barriers to Discharge: Continued Medical Work up  Expected Discharge Plan and Services Expected Discharge Plan: [place]   Discharge Planning Services: CM Consult Post Acute Care Choice: [place] arrangements for the past 2 months: Single Family Home                 DME Arranged: [place] rolling with seat DME Agency: [employer] Date DME Agency Contacted: 01/18/21 Time DME Agency Contacted: [phone] Representative spoke with at DME Agency: [place]: RN,PT [place] Agency: [place] ([place]) Date [place]: 01/18/21 Time HH Agency Contacted: 1000 Representative spoke with at [place]: [place] ([place]) Interventions    Readmission Risk Interventions Readmission Risk Prevention Plan 01/18/2021  Transportation Screening Complete  PCP or Specialist Appt within 3-5 Days Complete  HRI or [place] Complete  Social Work Consult for [place] Planning/Counseling Complete  Palliative Care Screening Not Applicable  Medication Review Press photographer) Complete  Some recent data might be hidden

## 2021-01-31 NOTE — Progress Notes (Signed)
Daily Progress Note   Patient Name: Tara Rodgers       Date: 01/31/2021 DOB: 02-10-1946  Age: 75 y.o. MRN#: [id number] Attending Physician: Harold Hedge, MD Primary Care Physician: Elby Showers, MD Admit Date: 02/01/2021  Reason for Consultation/Follow-up: Establishing goals of care  Subjective: I saw and examined Ms. Hoggard today. Her daughter is present at the bedside.  Daughter reports she has been reporting some pain.  Discussed plan for comfort and plan for transition to residential hospice when bed is available.    Length of Stay: 16  Current Medications: Scheduled Meds:  . Chlorhexidine Gluconate Cloth  6 each Topical Daily  . fentaNYL  1 patch Transdermal Q72H  . mouth rinse  15 mL Mouth Rinse BID  . multivitamin with minerals  1 tablet Oral Daily  . pantoprazole (PROTONIX) IV  40 mg Intravenous Q12H  . polyethylene glycol  17 g Oral Daily  . sodium chloride flush  10-40 mL Intracatheter Q12H  . sodium chloride flush  3 mL Intravenous Q12H  . sucralfate  1 g Oral TID WC & HS    Continuous Infusions: . 0.45 % NaCl with KCl 20 mEq / L 75 mL/hr at 01/31/21 0845  . cefTRIAXone (ROCEPHIN)  IV 1 g (01/31/21 1806)  . fluconazole (DIFLUCAN) IV 200 mg (01/31/21 1018)    PRN Meds: acetaminophen **OR** [DISCONTINUED] acetaminophen, antiseptic oral rinse, glycopyrrolate **OR** glycopyrrolate **OR** glycopyrrolate, haloperidol **OR** haloperidol **OR** haloperidol lactate, LORazepam **OR** LORazepam **OR** LORazepam, morphine injection, ondansetron (ZOFRAN) IV, polyvinyl alcohol, sodium chloride flush  Physical Exam         General: Alert, awake, in no acute distress.  Does not really participate in conversation. HEENT: No bruits, no goiter, no JVD Heart: Tachycardic. No  murmur appreciated. Lungs: Fair air movement, clear Abdomen: Soft, nontender, nondistended, positive bowel sounds.  Ext: No significant edema Skin: Warm and dry Neuro: Not oriented.  Does not follow directions.   Vital Signs: BP (!) 150/98 (BP Location: Left Arm)   Pulse (!) 126   Temp (!) 97.4 F (36.3 C) (Oral)   Resp 16   Ht 4\' 11"  (1.499 m)   Wt 48.7 kg   SpO2 99%   BMI 21.68 kg/m  SpO2: SpO2: 99 % O2 Device: O2 Device: Room Air O2 Flow Rate:  O2 Flow Rate (L/min): 10 L/min  Intake/output summary:   Intake/Output Summary (Last 24 hours) at 01/31/2021 2101 Last data filed at 01/31/2021 1500 Gross per 24 hour  Intake 1000 ml  Output 600 ml  Net 400 ml   LBM: Last BM Date: 01/22/21 (per chart review) Baseline Weight: Weight: 51 kg Most recent weight: Weight: 48.7 kg       Palliative Assessment/Data:      Patient Active Problem List   Diagnosis Date Noted  . Chemotherapy-induced neutropenia ([place])   . Pressure injury of skin 01/19/2021  . Malnutrition of moderate degree 01/16/2021  . Dysphagia 01/13/2021  . Port-A-Cath in place 12/04/2020  . Small cell lung cancer ([place]) 11/16/2020  . Goals of care, counseling/discussion 11/16/2020  . Sinus tachycardia 11/16/2019  . Left carotid bruit 11/16/2019  . Stiffness of right knee 04/01/2018  . Osteoarthritis of knee 03/13/2018  . History of total knee arthroplasty 03/13/2018  . Genetic testing 02/13/2015  . Abnormal chromosomal and genetic finding on antenatal screening mother 02/13/2015  . 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 Hypertensive disorder 09/19/2012  . History of malignant neoplasm of breast 09/19/2012  . Asthma 09/19/2012  . Allergic rhinitis 09/19/2012  . Gastroesophageal reflux disease 09/19/2012  . Anxiety 09/19/2012  . Hyperlipidemia 09/19/2012  . Hypothyroidism 09/19/2012  . Osteoporosis 09/19/2012    Palliative Care Assessment & Plan    Recommendations/Plan:  DNR/DNI  Increased as needed pain medication to morphine 2mg  as needed  Appreciate TOC assistance with referral to [facility] for end-of-life care.  To residential hospice when bed is available  Goals of Care and Additional Recommendations:  Limitations on Scope of Treatment: Full Comfort Care  Code Status:    Code Status Orders  (From admission, onward)         Start     Ordered   01/30/21 1437  Do not attempt resuscitation (DNR)  Continuous       Question Answer Comment  In the event of cardiac or respiratory ARREST Do not call a "code blue"   In the event of cardiac or respiratory ARREST Do not perform Intubation, CPR, defibrillation or ACLS   In the event of cardiac or respiratory ARREST Use medication by any route, position, wound care, and other measures to relive pain and suffering. May use oxygen, suction and manual treatment of airway obstruction as needed for comfort.      01/30/21 1438        Code Status History    Date Active Date Inactive Code Status Order ID Comments User Context   01/13/2021 1938 01/30/2021 1438 DNR [id number]  Patrecia Pour, MD Inpatient   03/13/2018 1743 03/15/2018 1455 Full Code [id number]  Lajean Manes, PA-C Inpatient   Advance Care Planning Activity    Advance Directive Documentation   Flowsheet Row Most Recent Value  Type of Advance Directive Living will  Pre-existing out of facility DNR order (yellow form or pink MOST form) --  "MOST" Form in Place? --       Prognosis:   I agree with Dr. [name] that she would best be served by transition to residential hospice and that prognosis at this point is likely days to less than 2 weeks  Discharge Planning:  Hospice facility  Care plan was discussed with daughter  Thank you for allowing the Palliative Medicine Team to assist in the care of this patient.   Total Time 20 Prolonged Time Billed No  Greater than 50%  of this time was spent counseling and  coordinating care related to the above assessment and plan.  [name], MD  Please contact Palliative Medicine Team phone at [phone] for questions and concerns.

## 2021-01-31 NOTE — Progress Notes (Signed)
PROGRESS NOTE    Tara Rodgers    Code Status: DNR  DXI:[id number] DOB: 04-10-1946 DOA: 01/17/2021 LOS: 16 days  PCP: Elby Showers, MD CC:  Chief Complaint  Patient presents with  . Emesis  . Nausea  . Dysphagia       Hospital Summary   This is a 75 year old female with past medical history of metastatic SCLC undergoing chemotherapy s/p XRT, transfusion dependent anemia, thrombocytopenia, right breast CA s/p mastectomy in 2016 on antiestrogen therapy who presented to the ED on 4/3 with complaints of 3 weeks of constant, worsening and severe dysphagia with globus sensation and odynophagia which limited her oral intake.  ED course: Tachycardic, afebrile, normotensive.  Ketones in urine, J6.7, metabolic acidosis and LFTs elevated.  4/17: Confused and unable to recognize her family.  Brain MRI with and without contrast- noted osseous metastatic disease however significant motion artifact 4/18: Still encephalopathic.  GI held off on planned esophageal stent.  Palliative care consulted and patient was made DNR 4/19: Continue Protonix twice daily.  Holding off on any procedures at this point.  Oncology recommending residential hospice.    A & P   Active Problems:   History of malignant neoplasm of breast   Hypothyroidism   Small cell lung cancer ([place])   Port-A-Cath in place   Dysphagia   Malnutrition of moderate degree   Pressure injury of skin   Chemotherapy-induced neutropenia (HCC)   1. Failure to thrive   Acute encephalopathy, likely metabolic and toxic secondary to UTI, poor p.o. intake and end-stage cancer, hypernatremia a. Continue antibiotics for now for comfort b. Palliative care on board with plans for discharge to residential hospice when bed is available and continue gentle IV fluids  2. Dysphagia  odynophagia  Oral thrush a. EGD on 4/13: Benign-appearing esophageal stenosis with radiation esophagitis with bleeding, acute gastritis and radiation-induced mild  esophageal stricture b. Diflucan to treat for comfort c. Carafate d. GI holding off on esophageal stenting now given her poor prognosis and dispo  3. Pancytopenia secondary to chemotherapy  stage IV small cell lung cancer a. Residential hospice at discharge  4. History of breast cancer  5. Hypertension and tachycardia a. Continue gentle IV fluids  6. Hypothyroidism a. Discontinue synthroid   DVT prophylaxis: none   Family Communication: Patient's daughter has been updated   Disposition Plan:  Status is: Inpatient  Remains inpatient appropriate because:Unsafe d/c plan   Dispo: The patient is from: Home              Anticipated d/c is to: residential hospice              Patient currently is not medically stable to d/c.   Difficult to place patient No           Pressure injury documentation   Pressure Injury 01/18/21 Sacrum Mid Stage 2 -  Partial thickness loss of dermis presenting as a shallow open injury with a red, pink wound bed without slough. small, round (Active)  01/18/21 2130  Location: Sacrum  Location Orientation: Mid  Staging: Stage 2 -  Partial thickness loss of dermis presenting as a shallow open injury with a red, pink wound bed without slough.  Wound Description (Comments): small, round  Present on Admission:     Consultants  GI Oncology Palliative  Procedures  None  Antibiotics   Anti-infectives (From admission, onward)   Start     Dose/Rate Route Frequency Ordered Stop   01/12/2021 1830  cefTRIAXone (ROCEPHIN) 1 g in sodium chloride 0.9 % 100 mL IVPB        1 g 200 mL/hr over 30 Minutes Intravenous Daily-1800 01/28/2021 1803     01/16/21 1000  fluconazole (DIFLUCAN) IVPB 200 mg        200 mg 100 mL/hr over 60 Minutes Intravenous Daily 01/15/21 1024     01/15/21 1300  cefTRIAXone (ROCEPHIN) 1 g in sodium chloride 0.9 % 100 mL IVPB  Status:  Discontinued        1 g 200 mL/hr over 30 Minutes Intravenous Every 24 hours 01/15/21 1210  01/21/21 1210   01/15/21 1100  fluconazole (DIFLUCAN) IVPB 400 mg        400 mg 100 mL/hr over 120 Minutes Intravenous  Once 01/15/21 1024 01/15/21 1326        Subjective   Patient resting comfortably and sleeping. Daughter at bedside. No issues overnight.  Objective   Vitals:   01/30/21 1242 01/30/21 1250 01/30/21 2349 01/31/21 0451  BP:   (!) 167/93 (!) 150/98  Pulse:   (!) 125 (!) 126  Resp:  (!) 24 20 16   Temp:    (!) 97.4 F (36.3 C)  TempSrc: Oral   Oral  SpO2:   99%   Weight:      Height:        Intake/Output Summary (Last 24 hours) at 01/31/2021 1344 Last data filed at 01/31/2021 0452 Gross per 24 hour  Intake 1657.42 ml  Output 600 ml  Net 1057.42 ml   Filed Weights   01/25/21 0530 01/26/21 0700 01/27/21 0500  Weight: 49.8 kg 49.5 kg 48.7 kg    Examination:  Physical Exam Vitals and nursing note reviewed. Exam conducted with a chaperone present.  Constitutional:      General: She is not in acute distress.    Comments: sleeping  HENT:     Mouth/Throat:     Mouth: Mucous membranes are moist.     Pharynx: Oropharyngeal exudate present.  Cardiovascular:     Rate and Rhythm: Normal rate and regular rhythm.  Pulmonary:     Effort: Pulmonary effort is normal. No respiratory distress.  Abdominal:     General: Abdomen is flat. There is no distension.  Musculoskeletal:        General: No swelling or tenderness.  Skin:    General: Skin is warm.     Coloration: Skin is not jaundiced.     Data Reviewed: I have personally reviewed following labs and imaging studies  CBC: Recent Labs  Lab 01/25/21 0512 01/26/21 0528 01/28/21 2238 02/05/2021 0554 01/30/21 1140  WBC 1.4* 1.4* 2.2* 2.5* 2.6*  NEUTROABS 0.7* 0.8*  --  1.4*  --   HGB 7.9* 8.0* 8.4* 8.4* 8.2*  HCT 24.0* 23.9* 26.1* 26.0* 26.0*  MCV 83.0 82.1 84.5 84.4 86.7  PLT 79* 96* 136* 146* 683*   Basic Metabolic Panel: Recent Labs  Lab 01/25/21 0512 01/26/21 0528 01/27/21 0534  01/28/21 2238 01/17/2021 0543 01/20/2021 0554 01/30/21 1140  NA 134* 134* 139  138 142 144  --  150*  K 3.0* 3.0* 3.0*  3.0* 4.2 4.2  --  3.8  CL 105 105 107  106 114* 115*  --  122*  CO2 19* 19* 19*  20* 20* 20*  --  17*  GLUCOSE 102* 100* 119*  121* 102* 104*  --  101*  BUN 7* 6* 9  9 11 11   --  10  CREATININE 0.51  0.57 0.62  0.62 0.73 0.75  --  0.61  CALCIUM 8.0* 7.9* 8.6*  8.7* 8.7* 8.9  --  8.8*  MG 1.5*  --  2.0  --   --  1.6*  --   PHOS 3.2  --  3.8  --  3.4  --   --    GFR: Estimated Creatinine Clearance: 42.7 mL/min (by C-G formula based on SCr of 0.61 mg/dL). Liver Function Tests: Recent Labs  Lab 01/25/21 0512 01/27/21 0534 01/21/2021 0543  AST 21  --   --   ALT 38  --   --   ALKPHOS 80  --   --   BILITOT 1.6*  --   --   PROT 5.4*  --   --   ALBUMIN 3.2* 3.3* 3.1*   No results for input(s): LIPASE, AMYLASE in the last 168 hours. No results for input(s): AMMONIA in the last 168 hours. Coagulation Profile: No results for input(s): INR, PROTIME in the last 168 hours. Cardiac Enzymes: No results for input(s): CKTOTAL, CKMB, CKMBINDEX, TROPONINI in the last 168 hours. BNP (last 3 results) No results for input(s): PROBNP in the last 8760 hours. HbA1C: No results for input(s): HGBA1C in the last 72 hours. CBG: Recent Labs  Lab 01/28/21 1906 01/28/21 2057  GLUCAP 117* 103*   Lipid Profile: No results for input(s): CHOL, HDL, LDLCALC, TRIG, CHOLHDL, LDLDIRECT in the last 72 hours. Thyroid Function Tests: No results for input(s): TSH, T4TOTAL, FREET4, T3FREE, THYROIDAB in the last 72 hours. Anemia Panel: No results for input(s): VITAMINB12, FOLATE, FERRITIN, TIBC, IRON, RETICCTPCT in the last 72 hours. Sepsis Labs: No results for input(s): PROCALCITON, LATICACIDVEN in the last 168 hours.  Recent Results (from the past 240 hour(s))  Culture, blood (routine x 2)     Status: None (Preliminary result)   Collection Time: 01/22/2021  8:17 AM   Specimen: BLOOD   Result Value Ref Range Status   Specimen Description   Final    BLOOD BLOOD LEFT FOREARM Performed at [facility] [address] [place], [place] [address]    Special Requests   Final    BOTTLES DRAWN AEROBIC ONLY Blood Culture results may not be optimal due to an inadequate volume of blood received in culture bottles Performed at [facility] [address] [place], [place] [address]    Culture   Final    NO GROWTH 2 DAYS Performed at [facility] [address], [place], [place] [address]    Report Status PENDING  Incomplete  Culture, blood (routine x 2)     Status: None (Preliminary result)   Collection Time: 02/07/2021 10:08 AM   Specimen: BLOOD  Result Value Ref Range Status   Specimen Description   Final    BLOOD LEFT HAND Performed at [facility] [address] [place],  [address]    Special Requests   Final    BOTTLES DRAWN AEROBIC ONLY Blood Culture adequate volume Performed at [facility] [address], [place],  [address]    Culture   Final    NO GROWTH 2 DAYS Performed at [facility] [address] [place],  [address]    Report Status PENDING  Incomplete  Culture, Urine     Status: Abnormal (Preliminary result)   Collection Time: 02/06/2021 10:52 AM   Specimen: Urine, Random  Result Value Ref Range Status   Specimen Description  Final    URINE, RANDOM Performed at [facility], [place] [address], [place], [place] [address]    Special Requests   Final    NONE Performed at [facility], [place] [address], [place], [place] [address]    Culture (A)  Final    >=100,000 COLONIES/mL CITROBACTER SPECIES SUSCEPTIBILITIES TO FOLLOW Performed at [facility], [place] [address] [place],  [address]    Report Status PENDING  Incomplete         Radiology Studies: No results found.      Scheduled  Meds: . Chlorhexidine Gluconate Cloth  6 each Topical Daily  . fentaNYL  1 patch Transdermal Q72H  . levothyroxine  50 mcg Intravenous Daily  . mouth rinse  15 mL Mouth Rinse BID  . metoprolol tartrate  5 mg Intravenous Q6H  . multivitamin with minerals  1 tablet Oral Daily  . nystatin  5 mL Oral QID  . pantoprazole (PROTONIX) IV  40 mg Intravenous Q12H  . polyethylene glycol  17 g Oral Daily  . sodium chloride flush  10-40 mL Intracatheter Q12H  . sodium chloride flush  3 mL Intravenous Q12H  . sucralfate  1 g Oral TID WC & HS   Continuous Infusions: . 0.45 % NaCl with KCl 20 mEq / L 75 mL/hr at 01/31/21 0845  . cefTRIAXone (ROCEPHIN)  IV 1 g (01/30/21 1818)  . fluconazole (DIFLUCAN) IV 200 mg (01/31/21 1018)     Time spent: 16 minutes with over 50% of the time coordinating the patient's care    Harold Hedge, DO [facility]   Call night coverage person covering after 7pm

## 2021-01-31 NOTE — Progress Notes (Signed)
Manufacturing engineer [facility]) Hospital Liaison note.    Chart and pt information have been reviewed by [facility] physician.  Hospice eligibility confirmed.  [facility] is unable to offer a room today. Hospital Liaison will follow up tomorrow or sooner if a room becomes available. Please do not hesitate to call with questions.    Thank you for the opportunity to participate in this patient's care.  Domenic Moras, BSN, RN [facility] Liaison (listed on [place] under Hospice/[facility])    [phone] (24h on call)

## 2021-02-01 ENCOUNTER — Ambulatory Visit: Payer: Medicare Other

## 2021-02-01 DIAGNOSIS — R1310 Dysphagia, unspecified: Secondary | ICD-10-CM | POA: Diagnosis not present

## 2021-02-01 DIAGNOSIS — C3490 Malignant neoplasm of unspecified part of unspecified bronchus or lung: Secondary | ICD-10-CM | POA: Diagnosis not present

## 2021-02-01 DIAGNOSIS — Z7189 Other specified counseling: Secondary | ICD-10-CM | POA: Diagnosis not present

## 2021-02-01 DIAGNOSIS — G893 Neoplasm related pain (acute) (chronic): Secondary | ICD-10-CM | POA: Diagnosis not present

## 2021-02-01 LAB — URINE CULTURE: Culture: >=100000 — AB

## 2021-02-01 NOTE — Progress Notes (Signed)
Manufacturing engineer [facility]) Hospital Liaison note.    Chart and pt information have been reviewed by [facility] physician.  Hospice eligibility confirmed.  [place] [facility] is unable to offer a room today. Hospital Liaison will follow up tomorrow or sooner if a room becomes available. Please do not hesitate to call with questions.    Thank you for the opportunity to participate in this patient's care.  Farrel Gordon, RN [facility] Liaison (listed on [facility] under Hospice[facility])    [phone] (24h on call)

## 2021-02-01 NOTE — Plan of Care (Deleted)

## 2021-02-01 NOTE — Progress Notes (Addendum)
Daily Progress Note   Patient Name: DONNIE GEDEON       Date: 02/01/2021 DOB: 11-Jul-1946  Age: 75 y.o. MRN#: [id number] Attending Physician: Harold Hedge, MD Primary Care Physician: Elby Showers, MD Admit Date: 01/25/2021  Reason for Consultation/Follow-up: Establishing goals of care  Subjective: I saw and examined Ms. Winegar today. Her daughter is present at the bedside.  She is less responsive than yesterday.  She opens eyes but no longer tracks me in the room.  Discussed with her daughter regarding plan for continued comfort and recommendation to stop IVF as she is starting to develop edema.  Also discussed plan to stop antibiotics.  We discussed transfer to residential hospice and that she is likely at the point where she is not stable enough for transfer.  Length of Stay: 17  Current Medications: Scheduled Meds:  . Chlorhexidine Gluconate Cloth  6 each Topical Daily  . fentaNYL  1 patch Transdermal Q72H  . mouth rinse  15 mL Mouth Rinse BID  . multivitamin with minerals  1 tablet Oral Daily  . pantoprazole (PROTONIX) IV  40 mg Intravenous Q12H  . sodium chloride flush  10-40 mL Intracatheter Q12H  . sodium chloride flush  3 mL Intravenous Q12H    Continuous Infusions:   PRN Meds: acetaminophen **OR** [DISCONTINUED] acetaminophen, antiseptic oral rinse, glycopyrrolate **OR** glycopyrrolate **OR** glycopyrrolate, haloperidol **OR** haloperidol **OR** haloperidol lactate, LORazepam **OR** LORazepam **OR** LORazepam, morphine injection, ondansetron (ZOFRAN) IV, polyvinyl alcohol, sodium chloride flush  Physical Exam         General: Largely somnolent, does not track in room. HEENT: No bruits, no goiter, no JVD Heart: Tachycardic. No murmur appreciated. Lungs: Fair air  movement, clear Abdomen: Soft, nontender, nondistended, positive bowel sounds.  Ext: No significant edema Skin: Warm and dry   Vital Signs: BP (!) 169/95 (BP Location: Right Leg)   Pulse (!) 111   Temp (!) 97.5 F (36.4 C) (Oral)   Resp 18   Ht 4\' 11"  (1.499 m)   Wt 48.7 kg   SpO2 100%   BMI 21.68 kg/m  SpO2: SpO2: 100 % O2 Device: O2 Device: Room Air O2 Flow Rate: O2 Flow Rate (L/min): 10 L/min  Intake/output summary:   Intake/Output Summary (Last 24 hours) at 02/01/2021 2055 Last data filed at 02/01/2021 1900  Gross per 24 hour  Intake 10 ml  Output 1500 ml  Net -1490 ml   LBM: Last BM Date: 01/22/21 (per chart review) Baseline Weight: Weight: 51 kg Most recent weight: Weight: 48.7 kg       Palliative Assessment/Data:      Patient Active Problem List   Diagnosis Date Noted  . Chemotherapy-induced neutropenia ([place])   . Pressure injury of skin 01/19/2021  . Malnutrition of moderate degree 01/16/2021  . Dysphagia 01/28/2021  . Port-A-Cath in place 12/04/2020  . Small cell lung cancer ([place]) 11/16/2020  . Goals of care, counseling/discussion 11/16/2020  . Sinus tachycardia 11/16/2019  . Left carotid bruit 11/16/2019  . Stiffness of right knee 04/01/2018  . Osteoarthritis of knee 03/13/2018  . History of total knee arthroplasty 03/13/2018  . Genetic testing 02/13/2015  . Abnormal chromosomal and genetic finding on antenatal screening mother 02/13/2015  . 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 Hypertensive disorder 09/19/2012  . History of malignant neoplasm of breast 09/19/2012  . Asthma 09/19/2012  . Allergic rhinitis 09/19/2012  . Gastroesophageal reflux disease 09/19/2012  . Anxiety 09/19/2012  . Hyperlipidemia 09/19/2012  . Hypothyroidism 09/19/2012  . Osteoporosis 09/19/2012    Palliative Care Assessment & Plan   Recommendations/Plan:  DNR/DNI  Increased as needed pain medication to morphine 2mg  as  needed  She appears worse today.  Discussed plan to continue current care and will continue to assess daily, but at this point, she appears too unstable to move and now anticipate a hospital death.  Goals of Care and Additional Recommendations:  Limitations on Scope of Treatment: Full Comfort Care  Code Status:    Code Status Orders  (From admission, onward)         Start     Ordered   01/30/21 1437  Do not attempt resuscitation (DNR)  Continuous       Question Answer Comment  In the event of cardiac or respiratory ARREST Do not call a "code blue"   In the event of cardiac or respiratory ARREST Do not perform Intubation, CPR, defibrillation or ACLS   In the event of cardiac or respiratory ARREST Use medication by any route, position, wound care, and other measures to relive pain and suffering. May use oxygen, suction and manual treatment of airway obstruction as needed for comfort.      01/30/21 1438        Code Status History    Date Active Date Inactive Code Status Order ID Comments User Context   01/26/2021 1938 01/30/2021 1438 DNR [id number]  Patrecia Pour, MD Inpatient   03/13/2018 1743 03/15/2018 1455 Full Code [id number]  Lajean Manes, PA-C Inpatient   Advance Care Planning Activity    Advance Directive Documentation   Flowsheet Row Most Recent Value  Type of Advance Directive Living will  Pre-existing out of facility DNR order (yellow form or pink MOST form) --  "MOST" Form in Place? --       Prognosis:  Hours to days  Discharge Planning:  Anticipated Hospital Death  Care plan was discussed with daughter  Thank you for allowing the Palliative Medicine Team to assist in the care of this patient.   Total Time 20 Prolonged Time Billed No  Greater than 50%  of this time was spent counseling and coordinating care related to the above assessment and plan.  [name], MD  Please contact Palliative Medicine Team phone at [phone] for questions and  concerns.

## 2021-02-01 NOTE — Progress Notes (Signed)
I provided support to Tara Rodgers's daughter, [name].  She is grieving that her mom will not be with her for much longer and also grieving that her mom has had to go through this process.  She feels guilty even though she knows in her mind that she doesn't need to.  I provided ministry of presence as well as prayer for her and for her mom.  [place], Bcc Pager, [phone] 4:39 PM

## 2021-02-01 NOTE — Progress Notes (Signed)
PROGRESS NOTE    Tara Rodgers    Code Status: DNR  EGB:[id number] DOB: 19-Dec-1945 DOA: 02/07/2021 LOS: 17 days  PCP: Elby Showers, MD CC:  Chief Complaint  Patient presents with  . Emesis  . Nausea  . Dysphagia       Hospital Summary   This is a 75 year old female with past medical history of metastatic SCLC undergoing chemotherapy s/p XRT, transfusion dependent anemia, thrombocytopenia, right breast CA s/p mastectomy in 2016 on antiestrogen therapy who presented to the ED on 4/3 with complaints of 3 weeks of constant, worsening and severe dysphagia with globus sensation and odynophagia which limited her oral intake.  ED course: Tachycardic, afebrile, normotensive.  Ketones in urine, P7.1, metabolic acidosis and LFTs elevated.  4/17: Confused and unable to recognize her family.  Brain MRI with and without contrast- noted osseous metastatic disease however significant motion artifact 4/18: Still encephalopathic.  GI held off on planned esophageal stent.  Palliative care consulted and patient was made DNR 4/19: Continue Protonix twice daily.  Holding off on any procedures at this point.  Oncology recommending residential hospice. 4/21: patient unstable for transfer to a residential hospice facility    A & P   Active Problems:   History of malignant neoplasm of breast   Hypothyroidism   Small cell lung cancer ([place])   Port-A-Cath in place   Dysphagia   Malnutrition of moderate degree   Pressure injury of skin   Chemotherapy-induced neutropenia ([facility])   1. Failure to thrive   Acute encephalopathy, likely metabolic and toxic secondary to UTI, poor p.o. intake and end-stage cancer, hypernatremia a. Initial plan for residential hospice but patient is now unstable for discharge. Anticipate hospital death in the coming days. b. Continue comfort measures only  2. Dysphagia  odynophagia  Oral thrush a. 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 GI holding off on esophageal stenting now given her poor prognosis and dispo  3. Pancytopenia secondary to chemotherapy  stage IV small cell lung cancer a. Comfort measures only  4. History of breast cancer  5. Hypertension and tachycardia a. Comfort care  6. Hypothyroidism a. Off synthroid   DVT prophylaxis: none   Family Communication: Patient's daughter has been updated today  Disposition Plan:  Status is: Inpatient  Remains inpatient appropriate because:Unsafe d/c plan   Dispo: The patient is from: Home              Anticipated d/c is to: anticipate hospital death in the coming days              Patient currently is not medically stable to d/c.   Difficult to place patient No           Pressure injury documentation   Pressure Injury 01/18/21 Sacrum Mid Stage 2 -  Partial thickness loss of dermis presenting as a shallow open injury with a red, pink wound bed without slough. small, round (Active)  01/18/21 2130  Location: Sacrum  Location Orientation: Mid  Staging: Stage 2 -  Partial thickness loss of dermis presenting as a shallow open injury with a red, pink wound bed without slough.  Wound Description (Comments): small, round  Present on Admission:     Consultants  GI Oncology Palliative  Procedures  None  Antibiotics   Anti-infectives (From admission, onward)   Start     Dose/Rate Route Frequency Ordered Stop   01/22/2021  1830  cefTRIAXone (ROCEPHIN) 1 g in sodium chloride 0.9 % 100 mL IVPB  Status:  Discontinued        1 g 200 mL/hr over 30 Minutes Intravenous Daily-1800 02/05/2021 1803 02/01/21 0958   01/16/21 1000  fluconazole (DIFLUCAN) IVPB 200 mg  Status:  Discontinued        200 mg 100 mL/hr over 60 Minutes Intravenous Daily 01/15/21 1024 02/01/21 0958   01/15/21 1300  cefTRIAXone (ROCEPHIN) 1 g in sodium chloride 0.9 % 100 mL IVPB  Status:  Discontinued         1 g 200 mL/hr over 30 Minutes Intravenous Every 24 hours 01/15/21 1210 01/21/21 1210   01/15/21 1100  fluconazole (DIFLUCAN) IVPB 400 mg        400 mg 100 mL/hr over 120 Minutes Intravenous  Once 01/15/21 1024 01/15/21 1326        Subjective   Patient resting comfortably in no acute distress. No overnight events.  Objective   Vitals:   01/30/21 1250 01/30/21 2349 01/31/21 0451 02/01/21 0516  BP:  (!) 167/93 (!) 150/98 (!) 169/95  Pulse:  (!) 125 (!) 126 (!) 111  Resp: (!) 24 20 16 18   Temp:   (!) 97.4 F (36.3 C) (!) 97.5 F (36.4 C)  TempSrc:   Oral Oral  SpO2:  99%  100%  Weight:      Height:        Intake/Output Summary (Last 24 hours) at 02/01/2021 1029 Last data filed at 02/01/2021 1024 Gross per 24 hour  Intake 1010 ml  Output 600 ml  Net 410 ml   Filed Weights   01/25/21 0530 01/26/21 0700 01/27/21 0500  Weight: 49.8 kg 49.5 kg 48.7 kg    Examination:  Physical Exam Vitals and nursing note reviewed. Exam conducted with a chaperone present.  Constitutional:      Appearance: She is ill-appearing.     Comments: Barely arousable to verbal stimuli  HENT:     Head: Normocephalic.     Mouth/Throat:     Mouth: Mucous membranes are dry.  Cardiovascular:     Rate and Rhythm: Regular rhythm. Tachycardia present.  Pulmonary:     Effort: Pulmonary effort is normal. No respiratory distress.  Abdominal:     General: Abdomen is flat. There is no distension.  Musculoskeletal:     Right lower leg: No edema.     Left lower leg: No edema.  Skin:    General: Skin is warm.     Coloration: Skin is pale.     Data Reviewed: I have personally reviewed following labs and imaging studies  CBC: Recent Labs  Lab 01/26/21 0528 01/28/21 2238 02/01/2021 0554 01/30/21 1140  WBC 1.4* 2.2* 2.5* 2.6*  NEUTROABS 0.8*  --  1.4*  --   HGB 8.0* 8.4* 8.4* 8.2*  HCT 23.9* 26.1* 26.0* 26.0*  MCV 82.1 84.5 84.4 86.7  PLT 96* 136* 146* 546*   Basic Metabolic Panel: Recent  Labs  Lab 01/26/21 0528 01/27/21 0534 01/28/21 2238 01/25/2021 0543 01/27/2021 0554 01/30/21 1140  NA 134* 139  138 142 144  --  150*  K 3.0* 3.0*  3.0* 4.2 4.2  --  3.8  CL 105 107  106 114* 115*  --  122*  CO2 19* 19*  20* 20* 20*  --  17*  GLUCOSE 100* 119*  121* 102* 104*  --  101*  BUN 6* 9  9 11 11   --  10  CREATININE 0.57 0.62  0.62 0.73 0.75  --  0.61  CALCIUM 7.9* 8.6*  8.7* 8.7* 8.9  --  8.8*  MG  --  2.0  --   --  1.6*  --   PHOS  --  3.8  --  3.4  --   --    GFR: Estimated Creatinine Clearance: 42.7 mL/min (by C-G formula based on SCr of 0.61 mg/dL). Liver Function Tests: Recent Labs  Lab 01/27/21 0534 01/27/2021 0543  ALBUMIN 3.3* 3.1*   No results for input(s): LIPASE, AMYLASE in the last 168 hours. No results for input(s): AMMONIA in the last 168 hours. Coagulation Profile: No results for input(s): INR, PROTIME in the last 168 hours. Cardiac Enzymes: No results for input(s): CKTOTAL, CKMB, CKMBINDEX, TROPONINI in the last 168 hours. BNP (last 3 results) No results for input(s): PROBNP in the last 8760 hours. HbA1C: No results for input(s): HGBA1C in the last 72 hours. CBG: Recent Labs  Lab 01/28/21 1906 01/28/21 2057  GLUCAP 117* 103*   Lipid Profile: No results for input(s): CHOL, HDL, LDLCALC, TRIG, CHOLHDL, LDLDIRECT in the last 72 hours. Thyroid Function Tests: No results for input(s): TSH, T4TOTAL, FREET4, T3FREE, THYROIDAB in the last 72 hours. Anemia Panel: No results for input(s): VITAMINB12, FOLATE, FERRITIN, TIBC, IRON, RETICCTPCT in the last 72 hours. Sepsis Labs: No results for input(s): PROCALCITON, LATICACIDVEN in the last 168 hours.  Recent Results (from the past 240 hour(s))  Culture, blood (routine x 2)     Status: None (Preliminary result)   Collection Time: 02/03/2021  8:17 AM   Specimen: BLOOD  Result Value Ref Range Status   Specimen Description   Final    BLOOD BLOOD LEFT FOREARM Performed at [facility] [address] [place], [place] [address]    Special Requests   Final    BOTTLES DRAWN AEROBIC ONLY Blood Culture results may not be optimal due to an inadequate volume of blood received in culture bottles Performed at [facility] [address] [place], [place] [address]    Culture   Final    NO GROWTH 3 DAYS Performed at [facility], [place] [address] [place], [place] [address]    Report Status PENDING  Incomplete  Culture, blood (routine x 2)     Status: None (Preliminary result)   Collection Time: 01/28/2021 10:08 AM   Specimen: BLOOD  Result Value Ref Range Status   Specimen Description   Final    BLOOD LEFT HAND Performed at [facility] [address] [place], [place] [address]    Special Requests   Final    BOTTLES DRAWN AEROBIC ONLY Blood Culture adequate volume Performed at [facility] [address], [place], [place] [address]    Culture   Final    NO GROWTH 3 DAYS Performed at [facility], [place] [address], [place], [place] [address]    Report Status PENDING  Incomplete  Culture, Urine     Status: Abnormal   Collection Time: 01/19/2021 10:52 AM   Specimen: Urine, Random  Result Value Ref Range Status   Specimen Description   Final    URINE, RANDOM Performed at [facility] [address] [place], [place] [address]    Special Requests   Final    NONE Performed at [facility], [place] [address] [place], [place] [address]    Culture >=100,000 COLONIES/mL Su Hoff (  A)  Final   Report Status 02/01/2021 FINAL  Final   Organism ID, Bacteria CITROBACTER YOUNGAE (A)  Final      Susceptibility   Citrobacter youngae - MIC*    CEFAZOLIN RESISTANT Resistant     CEFEPIME <=0.12 SENSITIVE Sensitive     CEFTRIAXONE <=0.25 SENSITIVE Sensitive     CIPROFLOXACIN <=0.25 SENSITIVE Sensitive     GENTAMICIN <=1 SENSITIVE Sensitive     IMIPENEM  <=0.25 SENSITIVE Sensitive     NITROFURANTOIN <=16 SENSITIVE Sensitive     TRIMETH/SULFA <=20 SENSITIVE Sensitive     PIP/TAZO <=4 SENSITIVE Sensitive     * >=100,000 COLONIES/mL CITROBACTER YOUNGAE         Radiology Studies: No results found.      Scheduled Meds: . Chlorhexidine Gluconate Cloth  6 each Topical Daily  . fentaNYL  1 patch Transdermal Q72H  . mouth rinse  15 mL Mouth Rinse BID  . multivitamin with minerals  1 tablet Oral Daily  . pantoprazole (PROTONIX) IV  40 mg Intravenous Q12H  . sodium chloride flush  10-40 mL Intracatheter Q12H  . sodium chloride flush  3 mL Intravenous Q12H   Continuous Infusions:    Time spent: 15 minutes with over 50% of the time coordinating the patient's care    Harold Hedge, DO [facility]   Call night coverage person covering after 7pm

## 2021-02-02 ENCOUNTER — Ambulatory Visit: Payer: Medicare Other

## 2021-02-02 DIAGNOSIS — G893 Neoplasm related pain (acute) (chronic): Secondary | ICD-10-CM | POA: Diagnosis not present

## 2021-02-02 DIAGNOSIS — Z7189 Other specified counseling: Secondary | ICD-10-CM | POA: Diagnosis not present

## 2021-02-02 DIAGNOSIS — R1310 Dysphagia, unspecified: Secondary | ICD-10-CM | POA: Diagnosis not present

## 2021-02-02 DIAGNOSIS — C3490 Malignant neoplasm of unspecified part of unspecified bronchus or lung: Secondary | ICD-10-CM | POA: Diagnosis not present

## 2021-02-02 MED ORDER — HALOPERIDOL LACTATE 5 MG/ML IJ SOLN: 2.0000 mg | INTRAMUSCULAR | Status: DC | PRN

## 2021-02-02 MED ORDER — HALOPERIDOL 0.5 MG PO TABS
0.5000 mg | ORAL_TABLET | ORAL | Status: DC | PRN
Start: 2021-02-02 — End: 2021-02-08

## 2021-02-02 MED ORDER — HALOPERIDOL LACTATE 2 MG/ML PO CONC
0.5000 mg | ORAL | Status: DC | PRN
Start: 2021-02-02 — End: 2021-02-08
  Filled 2021-02-02: qty 0.3

## 2021-02-02 NOTE — TOC Progression Note (Signed)
Transition of Care [facility]) - Progression Note    Patient Details  Name: Tara Rodgers MRN: [id number] Date of Birth: [birth year]  [facility] CM/SW Contact  Ross Ludwig, [place] Phone Number: 02/02/2021, 9:58 AM  Clinical Narrative:     CSW was informed by attending physician and palliative care that per MD patient is no long stable for transfer to [facility].  CSW updated [employer], CSW to continue to follow patient's progress throughout discharge planning.   Expected Discharge Plan: [place] Barriers to Discharge: Continued Medical Work up  Expected Discharge Plan and Services Expected Discharge Plan: [place]   Discharge Planning Services: CM Consult Post Acute Care Choice: [place] arrangements for the past 2 months: Single Family Home                 DME Arranged: [place] rolling with seat DME Agency: [employer] Date DME Agency Contacted: 01/18/21 Time DME Agency Contacted: [phone] Representative spoke with at DME Agency: [facility]: RN,PT [place] Agency: [employer] ([place]) Date [place]: 01/18/21 Time HH Agency Contacted: 1000 Representative spoke with at [place]: [place] ([place]) Interventions    Readmission Risk Interventions Readmission Risk Prevention Plan 01/18/2021  Transportation Screening Complete  PCP or Specialist Appt within 3-5 Days Complete  HRI or [place] Complete  Social Work Consult for [place] Planning/Counseling Complete  Palliative Care Screening Not Applicable  Medication Review Press [employer]) Complete  Some recent data might be hidden

## 2021-02-02 NOTE — Progress Notes (Signed)
Oncology brief note   I stopped by to see patient, her daughter and sister were at the bedside.  Patient is actively dying, with minimal urine output, not eating or drinking.  She appears to be comfortable.  I spoke with her daughter and sister, emotional support given, they are comfortable to keep her here, in anticipating of death in the next few days.  [name]  02/02/2021

## 2021-02-02 NOTE — Progress Notes (Signed)
PROGRESS NOTE    Tara Rodgers    Code Status: DNR  FYT:[id number] DOB: Feb 21, 1946 DOA: 02/02/2021 LOS: 18 days  PCP: Elby Showers, MD CC:  Chief Complaint  Patient presents with  . Emesis  . Nausea  . Dysphagia       Hospital Summary   This is a 75 year old female with past medical history of metastatic SCLC undergoing chemotherapy s/p XRT, transfusion dependent anemia, thrombocytopenia, right breast CA s/p mastectomy in 2016 on antiestrogen therapy who presented to the ED on 4/3 with complaints of 3 weeks of constant, worsening and severe dysphagia with globus sensation and odynophagia which limited her oral intake.  ED course: Tachycardic, afebrile, normotensive.  Ketones in urine, T7.7, metabolic acidosis and LFTs elevated.  4/17: Confused and unable to recognize her family.  Brain MRI with and without contrast- noted osseous metastatic disease however significant motion artifact 4/18: Still encephalopathic.  GI held off on planned esophageal stent.  Palliative care consulted and patient was made DNR 4/19: Continue Protonix twice daily.  Holding off on any procedures at this point.  Oncology recommending residential hospice. 4/21: patient unstable for transfer to a residential hospice facility    A & P   Active Problems:   History of malignant neoplasm of breast   Hypothyroidism   Small cell lung cancer ([place])   Port-A-Cath in place   Dysphagia   Malnutrition of moderate degree   Pressure injury of skin   Chemotherapy-induced neutropenia (HCC)   1. Failure to thrive   Acute encephalopathy, likely metabolic and toxic secondary to UTI, poor p.o. intake and end-stage cancer, hypernatremia a. Continue comfort measures only  2. Dysphagia  odynophagia  Oral thrush  3. Pancytopenia secondary to chemotherapy  stage IV small cell lung cancer a. Comfort measures only  4. History of breast cancer  5. Hypertension and tachycardia a. Comfort  care  6. Hypothyroidism a. Off synthroid   DVT prophylaxis: none   Family Communication: Patient's daughter has been updated today at bedside.  Provided reassurance to daughter who has been at the patient's bedside for the past several days and feeling guilty and sad.  She spoke with the chaplain yesterday which was appreciated.  Disposition Plan: [facility] death in the next day or so Status is: Inpatient  Remains inpatient appropriate because:Unsafe d/c plan   Dispo: The patient is from: Home              Anticipated d/c is to: anticipate hospital death in the coming days              Patient currently is not medically stable to d/c.   Difficult to place patient No           Pressure injury documentation   Pressure Injury 01/18/21 Sacrum Mid Stage 2 -  Partial thickness loss of dermis presenting as a shallow open injury with a red, pink wound bed without slough. small, round (Active)  01/18/21 2130  Location: Sacrum  Location Orientation: Mid  Staging: Stage 2 -  Partial thickness loss of dermis presenting as a shallow open injury with a red, pink wound bed without slough.  Wound Description (Comments): small, round  Present on Admission:     Consultants  GI Oncology Palliative  Procedures  None  Antibiotics   Anti-infectives (From admission, onward)   Start     Dose/Rate Route Frequency Ordered Stop   01/30/2021 1830  cefTRIAXone (ROCEPHIN) 1 g in sodium chloride 0.9 %  100 mL IVPB  Status:  Discontinued        1 g 200 mL/hr over 30 Minutes Intravenous Daily-1800 01/31/2021 1803 02/01/21 0958   01/16/21 1000  fluconazole (DIFLUCAN) IVPB 200 mg  Status:  Discontinued        200 mg 100 mL/hr over 60 Minutes Intravenous Daily 01/15/21 1024 02/01/21 0958   01/15/21 1300  cefTRIAXone (ROCEPHIN) 1 g in sodium chloride 0.9 % 100 mL IVPB  Status:  Discontinued        1 g 200 mL/hr over 30 Minutes Intravenous Every 24 hours 01/15/21 1210 01/21/21 1210    01/15/21 1100  fluconazole (DIFLUCAN) IVPB 400 mg        400 mg 100 mL/hr over 120 Minutes Intravenous  Once 01/15/21 1024 01/15/21 1326        Subjective   Patient resting comfortably no acute distress with daughter at bedside.    Objective   Vitals:   01/30/21 2349 01/31/21 0451 02/01/21 0516 02/02/21 0629  BP: (!) 167/93 (!) 150/98 (!) 169/95 (!) 139/55  Pulse: (!) 125 (!) 126 (!) 111 (!) 118  Resp: 20 16 18 18   Temp:  (!) 97.4 F (36.3 C) (!) 97.5 F (36.4 C) (!) 97.5 F (36.4 C)  TempSrc:  Oral Oral Oral  SpO2: 99%  100% 100%  Weight:      Height:        Intake/Output Summary (Last 24 hours) at 02/02/2021 1208 Last data filed at 02/02/2021 0630 Gross per 24 hour  Intake --  Output 575 ml  Net -575 ml   Filed Weights   01/25/21 0530 01/26/21 0700 01/27/21 0500  Weight: 49.8 kg 49.5 kg 48.7 kg    Examination:  Physical Exam Vitals and nursing note reviewed. Exam conducted with a chaperone present.  Constitutional:      General: She is not in acute distress. HENT:     Head: Normocephalic.     Mouth/Throat:     Mouth: Mucous membranes are dry.  Cardiovascular:     Rate and Rhythm: Regular rhythm. Tachycardia present.  Pulmonary:     Effort: Pulmonary effort is normal.  Abdominal:     General: There is no distension.  Skin:    General: Skin is warm.     Data Reviewed: I have personally reviewed following labs and imaging studies  CBC: Recent Labs  Lab 01/28/21 2238 02/04/2021 0554 01/30/21 1140  WBC 2.2* 2.5* 2.6*  NEUTROABS  --  1.4*  --   HGB 8.4* 8.4* 8.2*  HCT 26.1* 26.0* 26.0*  MCV 84.5 84.4 86.7  PLT 136* 146* 967*   Basic Metabolic Panel: Recent Labs  Lab 01/27/21 0534 01/28/21 2238 01/22/2021 0543 02/04/2021 0554 01/30/21 1140  NA 139  138 142 144  --  150*  K 3.0*  3.0* 4.2 4.2  --  3.8  CL 107  106 114* 115*  --  122*  CO2 19*  20* 20* 20*  --  17*  GLUCOSE 119*  121* 102* 104*  --  101*  BUN 9  9 11 11   --  10   CREATININE 0.62  0.62 0.73 0.75  --  0.61  CALCIUM 8.6*  8.7* 8.7* 8.9  --  8.8*  MG 2.0  --   --  1.6*  --   PHOS 3.8  --  3.4  --   --    GFR: Estimated Creatinine Clearance: 42.7 mL/min (by C-G formula based on SCr of  0.61 mg/dL). Liver Function Tests: Recent Labs  Lab 01/27/21 0534 02/09/2021 0543  ALBUMIN 3.3* 3.1*   No results for input(s): LIPASE, AMYLASE in the last 168 hours. No results for input(s): AMMONIA in the last 168 hours. Coagulation Profile: No results for input(s): INR, PROTIME in the last 168 hours. Cardiac Enzymes: No results for input(s): CKTOTAL, CKMB, CKMBINDEX, TROPONINI in the last 168 hours. BNP (last 3 results) No results for input(s): PROBNP in the last 8760 hours. HbA1C: No results for input(s): HGBA1C in the last 72 hours. CBG: Recent Labs  Lab 01/28/21 1906 01/28/21 2057  GLUCAP 117* 103*   Lipid Profile: No results for input(s): CHOL, HDL, LDLCALC, TRIG, CHOLHDL, LDLDIRECT in the last 72 hours. Thyroid Function Tests: No results for input(s): TSH, T4TOTAL, FREET4, T3FREE, THYROIDAB in the last 72 hours. Anemia Panel: No results for input(s): VITAMINB12, FOLATE, FERRITIN, TIBC, IRON, RETICCTPCT in the last 72 hours. Sepsis Labs: No results for input(s): PROCALCITON, LATICACIDVEN in the last 168 hours.  Recent Results (from the past 240 hour(s))  Culture, blood (routine x 2)     Status: None (Preliminary result)   Collection Time: 02/10/2021  8:17 AM   Specimen: BLOOD  Result Value Ref Range Status   Specimen Description   Final    BLOOD BLOOD LEFT FOREARM Performed at [facility] [address] [place], [place] [address]    Special Requests   Final    BOTTLES DRAWN AEROBIC ONLY Blood Culture results may not be optimal due to an inadequate volume of blood received in culture bottles Performed at [facility] [address], [place], [place] [address]    Culture   Final    NO GROWTH 4  DAYS Performed at [facility], [place] [address], [place], [place] [address]    Report Status PENDING  Incomplete  Culture, blood (routine x 2)     Status: None (Preliminary result)   Collection Time: 01/25/2021 10:08 AM   Specimen: BLOOD  Result Value Ref Range Status   Specimen Description   Final    BLOOD LEFT HAND Performed at [facility] [address], [place], [place] [address]    Special Requests   Final    BOTTLES DRAWN AEROBIC ONLY Blood Culture adequate volume Performed at [facility] [address] [place], [place] [address]    Culture   Final    NO GROWTH 4 DAYS Performed at [facility], [place] [address], [place], [place] [address]    Report Status PENDING  Incomplete  Culture, Urine     Status: Abnormal   Collection Time: 02/04/2021 10:52 AM   Specimen: Urine, Random  Result Value Ref Range Status   Specimen Description   Final    URINE, RANDOM Performed at [facility] [address] [place], [place] [address]    Special Requests   Final    NONE Performed at [facility], [place] [address], [place], [place] [address]    Culture >=100,000 COLONIES/mL Su Hoff (A)  Final   Report Status 02/01/2021 FINAL  Final   Organism ID, Bacteria CITROBACTER YOUNGAE (A)  Final      Susceptibility   Citrobacter youngae - MIC*    CEFAZOLIN RESISTANT Resistant     CEFEPIME <=0.12 SENSITIVE Sensitive     CEFTRIAXONE <=0.25 SENSITIVE Sensitive     CIPROFLOXACIN <=0.25 SENSITIVE Sensitive     GENTAMICIN <=1 SENSITIVE  Sensitive     IMIPENEM <=0.25 SENSITIVE Sensitive     NITROFURANTOIN <=16 SENSITIVE Sensitive     TRIMETH/SULFA <=20 SENSITIVE Sensitive     PIP/TAZO <=4 SENSITIVE Sensitive     * >=100,000 COLONIES/mL CITROBACTER YOUNGAE         Radiology Studies: No results found.      Scheduled Meds: . Chlorhexidine Gluconate Cloth  6 each Topical Daily  . fentaNYL   1 patch Transdermal Q72H  . mouth rinse  15 mL Mouth Rinse BID  . multivitamin with minerals  1 tablet Oral Daily  . pantoprazole (PROTONIX) IV  40 mg Intravenous Q12H  . sodium chloride flush  10-40 mL Intracatheter Q12H  . sodium chloride flush  3 mL Intravenous Q12H   Continuous Infusions:    Time spent: 15 minutes with over 50% of the time coordinating the patient's care    Harold Hedge, DO [facility]   Call night coverage person covering after 7pm

## 2021-02-02 NOTE — Progress Notes (Signed)
Daily Progress Note   Patient Name: Tara Rodgers       Date: 02/02/2021 DOB: 03/31/1946  Age: 75 y.o. MRN#: [id number] Attending Physician: Harold Hedge, MD Primary Care Physician: Elby Showers, MD Admit Date: 02/02/2021  Reason for Consultation/Follow-up: Establishing goals of care  Subjective: I saw and examined Tara Rodgers today.  Her daughter was present at the bedside.  She is actively dying and continues to decline.  Eyes are half open and glazed today.  She does not respond to verbal or tactile stimulation.  I discussed signs of dying with her daughter and we talked about nonverbal symptoms of discomfort.  Discussed medications for symptom management and I left a copy of gone from my sight with her.  Length of Stay: 18  Current Medications: Scheduled Meds:  . Chlorhexidine Gluconate Cloth  6 each Topical Daily  . fentaNYL  1 patch Transdermal Q72H  . mouth rinse  15 mL Mouth Rinse BID  . multivitamin with minerals  1 tablet Oral Daily  . pantoprazole (PROTONIX) IV  40 mg Intravenous Q12H  . sodium chloride flush  10-40 mL Intracatheter Q12H  . sodium chloride flush  3 mL Intravenous Q12H    Continuous Infusions:   PRN Meds: acetaminophen **OR** [DISCONTINUED] acetaminophen, antiseptic oral rinse, glycopyrrolate **OR** glycopyrrolate **OR** glycopyrrolate, haloperidol **OR** haloperidol **OR** haloperidol lactate, LORazepam **OR** LORazepam **OR** LORazepam, morphine injection, ondansetron (ZOFRAN) IV, polyvinyl alcohol, sodium chloride flush  Physical Exam         General: Somnolent, eyes partially open. HEENT: No bruits, no goiter, no JVD Heart: Tachycardic. No murmur appreciated. Lungs: Fair air movement, clear Abdomen: Soft, nontender, nondistended, positive  bowel sounds.  Ext: No significant edema Skin: Warm and dry   Vital Signs: BP (!) 139/55 (BP Location: Left Arm)   Pulse (!) 118   Temp (!) 97.5 F (36.4 C) (Oral)   Resp 18   Ht 4\' 11"  (1.499 m)   Wt 48.7 kg   SpO2 100%   BMI 21.68 kg/m  SpO2: SpO2: 100 % O2 Device: O2 Device: Room Air O2 Flow Rate: O2 Flow Rate (L/min): 10 L/min  Intake/output summary:   Intake/Output Summary (Last 24 hours) at 02/02/2021 1843 Last data filed at 02/02/2021 0630 Gross per 24 hour  Intake --  Output 575 ml  Net -575 ml   LBM: Last BM Date: 01/22/21 (per chart review) Baseline Weight: Weight: 51 kg Most recent weight: Weight: 48.7 kg       Palliative Assessment/Data:      Patient Active Problem List   Diagnosis Date Noted  . Chemotherapy-induced neutropenia ([place])   . Pressure injury of skin 01/19/2021  . Malnutrition of moderate degree 01/16/2021  . Dysphagia 02/06/2021  . Port-A-Cath in place 12/04/2020  . Small cell lung cancer ([place]) 11/16/2020  . Goals of care, counseling/discussion 11/16/2020  . Sinus tachycardia 11/16/2019  . Left carotid bruit 11/16/2019  . Stiffness of right knee 04/01/2018  . Osteoarthritis of knee 03/13/2018  . History of total knee arthroplasty 03/13/2018  . Genetic testing 02/13/2015  . Abnormal chromosomal and genetic finding on antenatal screening mother 02/13/2015  . 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 Hypertensive disorder 09/19/2012  . History of malignant neoplasm of breast 09/19/2012  . Asthma 09/19/2012  . Allergic rhinitis 09/19/2012  . Gastroesophageal reflux disease 09/19/2012  . Anxiety 09/19/2012  . Hyperlipidemia 09/19/2012  . Hypothyroidism 09/19/2012  . Osteoporosis 09/19/2012    Palliative Care Assessment & Plan   Recommendations/Plan:  DNR/DNI  For pain, continue morphine 2mg  as needed  Discussed terminal agitation and would recommend Haldol as needed if symptoms recur.  She  continues to decline.  Anticipate hospital death.  Goals of Care and Additional Recommendations:  Limitations on Scope of Treatment: Full Comfort Care  Code Status:    Code Status Orders  (From admission, onward)         Start     Ordered   01/30/21 1437  Do not attempt resuscitation (DNR)  Continuous       Question Answer Comment  In the event of cardiac or respiratory ARREST Do not call a "code blue"   In the event of cardiac or respiratory ARREST Do not perform Intubation, CPR, defibrillation or ACLS   In the event of cardiac or respiratory ARREST Use medication by any route, position, wound care, and other measures to relive pain and suffering. May use oxygen, suction and manual treatment of airway obstruction as needed for comfort.      01/30/21 1438        Code Status History    Date Active Date Inactive Code Status Order ID Comments User Context   02/01/2021 1938 01/30/2021 1438 DNR [id number]  Patrecia Pour, MD Inpatient   03/13/2018 1743 03/15/2018 1455 Full Code [id number]  Lajean Manes, PA-C Inpatient   Advance Care Planning Activity    Advance Directive Documentation   Flowsheet Row Most Recent Value  Type of Advance Directive Living will  Pre-existing out of facility DNR order (yellow form or pink MOST form) --  "MOST" Form in Place? --       Prognosis:  Hours to days  Discharge Planning:  Anticipated Hospital Death  Care plan was discussed with daughter  Thank you for allowing the Palliative Medicine Team to assist in the care of this patient.   Total Time 20 Prolonged Time Billed No  Greater than 50%  of this time was spent counseling and coordinating care related to the above assessment and plan.   [name], MD  Please contact Palliative Medicine Team phone at [phone] for questions and concerns.

## 2021-02-03 DIAGNOSIS — R1310 Dysphagia, unspecified: Secondary | ICD-10-CM | POA: Diagnosis not present

## 2021-02-03 DIAGNOSIS — Z7189 Other specified counseling: Secondary | ICD-10-CM | POA: Diagnosis not present

## 2021-02-03 DIAGNOSIS — G893 Neoplasm related pain (acute) (chronic): Secondary | ICD-10-CM | POA: Diagnosis not present

## 2021-02-03 DIAGNOSIS — C3490 Malignant neoplasm of unspecified part of unspecified bronchus or lung: Secondary | ICD-10-CM | POA: Diagnosis not present

## 2021-02-03 LAB — CULTURE, BLOOD (ROUTINE X 2)
Culture: NO GROWTH
Culture: NO GROWTH
Special Requests: ADEQUATE

## 2021-02-03 NOTE — Progress Notes (Signed)
PROGRESS NOTE    Tara Rodgers    Code Status: DNR  ZOX:[id number] DOB: 1946-04-03 DOA: 01/16/2021 LOS: 19 days  PCP: Elby Showers, MD CC:  Chief Complaint  Patient presents with  . Emesis  . Nausea  . Dysphagia       Hospital Summary   This is a 75 year old female with past medical history of metastatic SCLC undergoing chemotherapy s/p XRT, transfusion dependent anemia, thrombocytopenia, right breast CA s/p mastectomy in 2016 on antiestrogen therapy who presented to the ED on 4/3 with complaints of 3 weeks of constant, worsening and severe dysphagia with globus sensation and odynophagia which limited her oral intake.  ED course: Tachycardic, afebrile, normotensive.  Ketones in urine, W1.1, metabolic acidosis and LFTs elevated.  4/17: Confused and unable to recognize her family.  Brain MRI with and without contrast- noted osseous metastatic disease however significant motion artifact 4/18: Still encephalopathic.  GI held off on planned esophageal stent.  Palliative care consulted and patient was made DNR 4/19: Continue Protonix twice daily.  Holding off on any procedures at this point.  Oncology recommending residential hospice. 4/21: patient unstable for transfer to a residential hospice facility    A & P   Active Problems:   History of malignant neoplasm of breast   Hypothyroidism   Small cell lung cancer ([place])   Port-A-Cath in place   Dysphagia   Malnutrition of moderate degree   Pressure injury of skin   Chemotherapy-induced neutropenia (HCC)   1. Failure to thrive   Acute encephalopathy, likely metabolic and toxic secondary to UTI, poor p.o. intake and end-stage cancer, hypernatremia a. Continue comfort measures only  2. Dysphagia  odynophagia  Oral thrush  3. Pancytopenia secondary to chemotherapy  stage IV small cell lung cancer a. Comfort measures only  4. History of breast cancer  5. Hypertension and tachycardia a. Comfort  care  6. Hypothyroidism a. Off synthroid   DVT prophylaxis: none   Family Communication: family at bedside  Disposition Plan: Suspect hospital death in the next 24 hours Status is: Inpatient  Remains inpatient appropriate because:Unsafe d/c plan   Dispo: The patient is from: Home              Anticipated d/c is to: anticipate hospital death in the coming days              Patient currently is not medically stable to d/c.   Difficult to place patient No           Pressure injury documentation   Pressure Injury 01/18/21 Sacrum Mid Stage 2 -  Partial thickness loss of dermis presenting as a shallow open injury with a red, pink wound bed without slough. small, round (Active)  01/18/21 2130  Location: Sacrum  Location Orientation: Mid  Staging: Stage 2 -  Partial thickness loss of dermis presenting as a shallow open injury with a red, pink wound bed without slough.  Wound Description (Comments): small, round  Present on Admission:     Consultants  GI Oncology Palliative  Procedures  None  Antibiotics   Anti-infectives (From admission, onward)   Start     Dose/Rate Route Frequency Ordered Stop   01/12/2021 1830  cefTRIAXone (ROCEPHIN) 1 g in sodium chloride 0.9 % 100 mL IVPB  Status:  Discontinued        1 g 200 mL/hr over 30 Minutes Intravenous Daily-1800 01/20/2021 1803 02/01/21 0958   01/16/21 1000  fluconazole (DIFLUCAN) IVPB 200 mg  Status:  Discontinued        200 mg 100 mL/hr over 60 Minutes Intravenous Daily 01/15/21 1024 02/01/21 0958   01/15/21 1300  cefTRIAXone (ROCEPHIN) 1 g in sodium chloride 0.9 % 100 mL IVPB  Status:  Discontinued        1 g 200 mL/hr over 30 Minutes Intravenous Every 24 hours 01/15/21 1210 01/21/21 1210   01/15/21 1100  fluconazole (DIFLUCAN) IVPB 400 mg        400 mg 100 mL/hr over 120 Minutes Intravenous  Once 01/15/21 1024 01/15/21 1326        Subjective   Patient moaning a bit today which is new. RN gave morphine.  Family at bedside. No other issues.  Objective   Vitals:   01/31/21 0451 02/01/21 0516 02/02/21 0629 02/03/21 0611  BP: (!) 150/98 (!) 169/95 (!) 139/55 100/65  Pulse: (!) 126 (!) 111 (!) 118 (!) 121  Resp: 16 18 18 18   Temp: (!) 97.4 F (36.3 C) (!) 97.5 F (36.4 C) (!) 97.5 F (36.4 C) (!) 97.5 F (36.4 C)  TempSrc: Oral Oral Oral Oral  SpO2:  100% 100% 97%  Weight:      Height:       No intake or output data in the 24 hours ending 02/03/21 1621 Filed Weights   01/25/21 0530 01/26/21 0700 01/27/21 0500  Weight: 49.8 kg 49.5 kg 48.7 kg    Examination:  Physical Exam Vitals and nursing note reviewed. Exam conducted with a chaperone present.  Constitutional:      Appearance: She is toxic-appearing.     Comments: obtunded  HENT:     Mouth/Throat:     Mouth: Mucous membranes are dry.  Cardiovascular:     Rate and Rhythm: Regular rhythm. Tachycardia present.  Pulmonary:     Effort: No respiratory distress.     Breath sounds: No wheezing.  Skin:    General: Skin is dry.     Coloration: Skin is pale.  Neurological:     Mental Status: She is disoriented.     Data Reviewed: I have personally reviewed following labs and imaging studies  CBC: Recent Labs  Lab 01/28/21 2238 01/28/2021 0554 01/30/21 1140  WBC 2.2* 2.5* 2.6*  NEUTROABS  --  1.4*  --   HGB 8.4* 8.4* 8.2*  HCT 26.1* 26.0* 26.0*  MCV 84.5 84.4 86.7  PLT 136* 146* 604*   Basic Metabolic Panel: Recent Labs  Lab 01/28/21 2238 01/22/2021 0543 02/01/2021 0554 01/30/21 1140  NA 142 144  --  150*  K 4.2 4.2  --  3.8  CL 114* 115*  --  122*  CO2 20* 20*  --  17*  GLUCOSE 102* 104*  --  101*  BUN 11 11  --  10  CREATININE 0.73 0.75  --  0.61  CALCIUM 8.7* 8.9  --  8.8*  MG  --   --  1.6*  --   PHOS  --  3.4  --   --    GFR: Estimated Creatinine Clearance: 42.7 mL/min (by C-G formula based on SCr of 0.61 mg/dL). Liver Function Tests: Recent Labs  Lab 02/06/2021 0543  ALBUMIN 3.1*   No results  for input(s): LIPASE, AMYLASE in the last 168 hours. No results for input(s): AMMONIA in the last 168 hours. Coagulation Profile: No results for input(s): INR, PROTIME in the last 168 hours. Cardiac Enzymes: No results for input(s): CKTOTAL, CKMB, CKMBINDEX, TROPONINI in the last 168 hours.  BNP (last 3 results) No results for input(s): PROBNP in the last 8760 hours. HbA1C: No results for input(s): HGBA1C in the last 72 hours. CBG: Recent Labs  Lab 01/28/21 1906 01/28/21 2057  GLUCAP 117* 103*   Lipid Profile: No results for input(s): CHOL, HDL, LDLCALC, TRIG, CHOLHDL, LDLDIRECT in the last 72 hours. Thyroid Function Tests: No results for input(s): TSH, T4TOTAL, FREET4, T3FREE, THYROIDAB in the last 72 hours. Anemia Panel: No results for input(s): VITAMINB12, FOLATE, FERRITIN, TIBC, IRON, RETICCTPCT in the last 72 hours. Sepsis Labs: No results for input(s): PROCALCITON, LATICACIDVEN in the last 168 hours.  Recent Results (from the past 240 hour(s))  Culture, blood (routine x 2)     Status: None   Collection Time: 02/10/2021  8:17 AM   Specimen: BLOOD  Result Value Ref Range Status   Specimen Description   Final    BLOOD BLOOD LEFT FOREARM Performed at [facility] [address] [place], [place] [address]    Special Requests   Final    BOTTLES DRAWN AEROBIC ONLY Blood Culture results may not be optimal due to an inadequate volume of blood received in culture bottles Performed at [facility] [address], [place], [place] [address]    Culture   Final    NO GROWTH 5 DAYS Performed at [facility], [place] [address] [place], [place] [address]    Report Status 02/03/2021 FINAL  Final  Culture, blood (routine x 2)     Status: None   Collection Time: 01/22/2021 10:08 AM   Specimen: BLOOD  Result Value Ref Range Status   Specimen Description   Final    BLOOD LEFT HAND Performed at [facility] [address] [place], [place] [address]    Special Requests   Final    BOTTLES DRAWN AEROBIC ONLY Blood Culture adequate volume Performed at [facility] [address], [place], [place] [address]    Culture   Final    NO GROWTH 5 DAYS Performed at [facility], [place] [address], [place], [place] [address]    Report Status 02/03/2021 FINAL  Final  Culture, Urine     Status: Abnormal   Collection Time: 01/15/2021 10:52 AM   Specimen: Urine, Random  Result Value Ref Range Status   Specimen Description   Final    URINE, RANDOM Performed at [facility] [address], [place], [place] [address]    Special Requests   Final    NONE Performed at [facility], [place] [address] [place], [place] [address]    Culture >=100,000 COLONIES/mL Su Hoff (A)  Final   Report Status 02/01/2021 FINAL  Final   Organism ID, Bacteria CITROBACTER YOUNGAE (A)  Final      Susceptibility   Citrobacter youngae - MIC*    CEFAZOLIN RESISTANT Resistant     CEFEPIME <=0.12 SENSITIVE Sensitive     CEFTRIAXONE <=0.25 SENSITIVE Sensitive     CIPROFLOXACIN <=0.25 SENSITIVE Sensitive     GENTAMICIN <=1 SENSITIVE Sensitive     IMIPENEM <=0.25 SENSITIVE Sensitive     NITROFURANTOIN <=16 SENSITIVE Sensitive     TRIMETH/SULFA <=20 SENSITIVE Sensitive     PIP/TAZO <=4 SENSITIVE Sensitive     * >=100,000 COLONIES/mL CITROBACTER YOUNGAE         Radiology Studies: No results found.      Scheduled Meds: . Chlorhexidine Gluconate Cloth  6 each Topical  Daily  . fentaNYL  1 patch Transdermal Q72H  . mouth rinse  15 mL Mouth Rinse BID  . multivitamin with minerals  1 tablet Oral Daily  . pantoprazole (PROTONIX) IV  40 mg Intravenous Q12H  . sodium chloride flush  10-40 mL Intracatheter Q12H  . sodium chloride flush  3 mL Intravenous Q12H   Continuous Infusions:    Time spent: 18 minutes with over 50% of the time coordinating the patient's  care    Harold Hedge, DO [facility]   Call night coverage person covering after 7pm

## 2021-02-03 NOTE — Progress Notes (Signed)
Daily Progress Note   Patient Name: Tara Rodgers       Date: 02/03/2021 DOB: 1946-05-27  Age: 75 y.o. MRN#: [id number] Attending Physician: Harold Hedge, MD Primary Care Physician: Elby Showers, MD Admit Date: 01/26/2021  Reason for Consultation/Follow-up: Establishing goals of care  Subjective: I saw and examined Tara Rodgers today.  Her daughter was present at the bedside.  She is actively dying and continues to decline.  Eyes are half open and glazed today.  She continues to progress in the dying process.  Length of Stay: 19  Current Medications: Scheduled Meds:  . Chlorhexidine Gluconate Cloth  6 each Topical Daily  . fentaNYL  1 patch Transdermal Q72H  . mouth rinse  15 mL Mouth Rinse BID  . multivitamin with minerals  1 tablet Oral Daily  . pantoprazole (PROTONIX) IV  40 mg Intravenous Q12H  . sodium chloride flush  10-40 mL Intracatheter Q12H  . sodium chloride flush  3 mL Intravenous Q12H    Continuous Infusions:   PRN Meds: acetaminophen **OR** [DISCONTINUED] acetaminophen, antiseptic oral rinse, glycopyrrolate **OR** glycopyrrolate **OR** glycopyrrolate, haloperidol **OR** haloperidol **OR** haloperidol lactate, LORazepam **OR** LORazepam **OR** LORazepam, morphine injection, ondansetron (ZOFRAN) IV, polyvinyl alcohol, sodium chloride flush  Physical Exam         General: Somnolent, eyes partially open. HEENT: No bruits, no goiter, no JVD Heart: Tachycardic. No murmur appreciated. Lungs: Fair air movement, clear Abdomen: Soft, nontender, nondistended, positive bowel sounds.  Ext: No significant edema Skin: Warm and dry   Vital Signs: BP 100/65 (BP Location: Left Arm)   Pulse (!) 121   Temp (!) 97.5 F (36.4 C) (Oral)   Resp 18   Ht 4\' 11"  (1.499 m)   Wt  48.7 kg   SpO2 97%   BMI 21.68 kg/m  SpO2: SpO2: 97 % O2 Device: O2 Device: Room Air O2 Flow Rate: O2 Flow Rate (L/min): 10 L/min  Intake/output summary:   Intake/Output Summary (Last 24 hours) at 02/03/2021 2335 Last data filed at 02/03/2021 2217 Gross per 24 hour  Intake 3 ml  Output --  Net 3 ml   LBM: Last BM Date: 01/22/21 Baseline Weight: Weight: 51 kg Most recent weight: Weight: 48.7 kg       Palliative Assessment/Data:      Patient Active  Problem List   Diagnosis Date Noted  . Chemotherapy-induced neutropenia ([place])   . Pressure injury of skin 01/19/2021  . Malnutrition of moderate degree 01/16/2021  . Dysphagia 01/22/2021  . Port-A-Cath in place 12/04/2020  . Small cell lung cancer ([place]) 11/16/2020  . Goals of care, counseling/discussion 11/16/2020  . Sinus tachycardia 11/16/2019  . Left carotid bruit 11/16/2019  . Stiffness of right knee 04/01/2018  . Osteoarthritis of knee 03/13/2018  . History of total knee arthroplasty 03/13/2018  . Genetic testing 02/13/2015  . Abnormal chromosomal and genetic finding on antenatal screening mother 02/13/2015  . 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 Hypertensive disorder 09/19/2012  . History of malignant neoplasm of breast 09/19/2012  . Asthma 09/19/2012  . Allergic rhinitis 09/19/2012  . Gastroesophageal reflux disease 09/19/2012  . Anxiety 09/19/2012  . Hyperlipidemia 09/19/2012  . Hypothyroidism 09/19/2012  . Osteoporosis 09/19/2012    Palliative Care Assessment & Plan   Recommendations/Plan:  DNR/DNI- Full comfort moving forward  For pain, continue morphine 2mg  as needed  Discussed terminal agitation and would recommend Haldol as needed if symptoms recur.  She continues to decline.  Anticipate hospital death.  Goals of Care and Additional Recommendations:  Limitations on Scope of Treatment: Full Comfort Care  Code Status:    Code Status Orders  (From  admission, onward)         Start     Ordered   01/30/21 1437  Do not attempt resuscitation (DNR)  Continuous       Question Answer Comment  In the event of cardiac or respiratory ARREST Do not call a "code blue"   In the event of cardiac or respiratory ARREST Do not perform Intubation, CPR, defibrillation or ACLS   In the event of cardiac or respiratory ARREST Use medication by any route, position, wound care, and other measures to relive pain and suffering. May use oxygen, suction and manual treatment of airway obstruction as needed for comfort.      01/30/21 1438        Code Status History    Date Active Date Inactive Code Status Order ID Comments User Context   02/06/2021 1938 01/30/2021 1438 DNR [id number]  Patrecia Pour, MD Inpatient   03/13/2018 1743 03/15/2018 1455 Full Code [id number]  Lajean Manes, PA-C Inpatient   Advance Care Planning Activity    Advance Directive Documentation   Flowsheet Row Most Recent Value  Type of Advance Directive Living will  Pre-existing out of facility DNR order (yellow form or pink MOST form) --  "MOST" Form in Place? --       Prognosis:  Hours to days  Discharge Planning:  Anticipated Hospital Death  Care plan was discussed with daughter  Thank you for allowing the Palliative Medicine Team to assist in the care of this patient.   Total Time 20 Prolonged Time Billed No  Greater than 50%  of this time was spent counseling and coordinating care related to the above assessment and plan.  [name], MD  Please contact Palliative Medicine Team phone at [phone] for questions and concerns.

## 2021-02-04 DIAGNOSIS — Z7189 Other specified counseling: Secondary | ICD-10-CM | POA: Diagnosis not present

## 2021-02-04 DIAGNOSIS — R1310 Dysphagia, unspecified: Secondary | ICD-10-CM | POA: Diagnosis not present

## 2021-02-04 DIAGNOSIS — C3490 Malignant neoplasm of unspecified part of unspecified bronchus or lung: Secondary | ICD-10-CM | POA: Diagnosis not present

## 2021-02-04 DIAGNOSIS — G893 Neoplasm related pain (acute) (chronic): Secondary | ICD-10-CM | POA: Diagnosis not present

## 2021-02-04 NOTE — Progress Notes (Signed)
Daily Progress Note   Patient Name: Tara Rodgers       Date: 02/04/2021 DOB: 11/02/1945  Age: 75 y.o. MRN#: [id number] Attending Physician: Harold Hedge, MD Primary Care Physician: Elby Showers, MD Admit Date: 01/12/2021  Reason for Consultation/Follow-up: Establishing goals of care  Subjective: I saw and examined Tara Rodgers today. Family at bedside.  She is actively dying and unresponsive.  Length of Stay: 20  Current Medications: Scheduled Meds:  . Chlorhexidine Gluconate Cloth  6 each Topical Daily  . fentaNYL  1 patch Transdermal Q72H  . mouth rinse  15 mL Mouth Rinse BID  . pantoprazole (PROTONIX) IV  40 mg Intravenous Q12H  . sodium chloride flush  10-40 mL Intracatheter Q12H  . sodium chloride flush  3 mL Intravenous Q12H    Continuous Infusions:   PRN Meds: antiseptic oral rinse, glycopyrrolate **OR** glycopyrrolate **OR** glycopyrrolate, haloperidol **OR** haloperidol **OR** haloperidol lactate, LORazepam **OR** LORazepam **OR** LORazepam, morphine injection, ondansetron (ZOFRAN) IV, polyvinyl alcohol, sodium chloride flush  Physical Exam         General: Somnolent, unresponsive. HEENT: No bruits, no goiter, no JVD Heart: Tachycardic. No murmur appreciated. Lungs: Fair air movement, apenic periods noted  Ext: No significant edema Skin: Warm and dry   Vital Signs: BP 98/72 (BP Location: Left Arm)   Pulse (!) 130   Temp (!) 97.5 F (36.4 C) (Axillary)   Resp 18   Ht 4\' 11"  (1.499 m)   Wt 48.7 kg   SpO2 97%   BMI 21.68 kg/m  SpO2: SpO2: 97 % O2 Device: O2 Device: Room Air O2 Flow Rate: O2 Flow Rate (L/min): 10 L/min  Intake/output summary:  No intake or output data in the 24 hours ending 02/04/21 2253 LBM: Last BM Date: 01/22/21 Baseline Weight:  Weight: 51 kg Most recent weight: Weight: 48.7 kg       Palliative Assessment/Data:      Patient Active Problem List   Diagnosis Date Noted  . Chemotherapy-induced neutropenia ([place])   . Pressure injury of skin 01/19/2021  . Malnutrition of moderate degree 01/16/2021  . Dysphagia 02/07/2021  . Port-A-Cath in place 12/04/2020  . Small cell lung cancer ([place]) 11/16/2020  . Goals of care, counseling/discussion 11/16/2020  . Sinus tachycardia 11/16/2019  . Left carotid bruit 11/16/2019  .  Stiffness of right knee 04/01/2018  . Osteoarthritis of knee 03/13/2018  . History of total knee arthroplasty 03/13/2018  . Genetic testing 02/13/2015  . Abnormal chromosomal and genetic finding on antenatal screening mother 02/13/2015  . 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 Hypertensive disorder 09/19/2012  . History of malignant neoplasm of breast 09/19/2012  . Asthma 09/19/2012  . Allergic rhinitis 09/19/2012  . Gastroesophageal reflux disease 09/19/2012  . Anxiety 09/19/2012  . Hyperlipidemia 09/19/2012  . Hypothyroidism 09/19/2012  . Osteoporosis 09/19/2012    Palliative Care Assessment & Plan   Recommendations/Plan:  DNR/DNI- Full comfort moving forward  For pain, continue morphine 2mg  as needed  Discussed terminal agitation and would recommend Haldol as needed if symptoms recur.  She is actively dying.  Anticipate hospital death.  Goals of Care and Additional Recommendations:  Limitations on Scope of Treatment: Full Comfort Care  Code Status:    Code Status Orders  (From admission, onward)         Start     Ordered   01/30/21 1437  Do not attempt resuscitation (DNR)  Continuous       Question Answer Comment  In the event of cardiac or respiratory ARREST Do not call a "code blue"   In the event of cardiac or respiratory ARREST Do not perform Intubation, CPR, defibrillation or ACLS   In the event of cardiac or respiratory ARREST  Use medication by any route, position, wound care, and other measures to relive pain and suffering. May use oxygen, suction and manual treatment of airway obstruction as needed for comfort.      01/30/21 1438        Code Status History    Date Active Date Inactive Code Status Order ID Comments User Context   01/19/2021 1938 01/30/2021 1438 DNR [id number]  Patrecia Pour, MD Inpatient   03/13/2018 1743 03/15/2018 1455 Full Code [id number]  Lajean Manes, PA-C Inpatient   Advance Care Planning Activity    Advance Directive Documentation   Flowsheet Row Most Recent Value  Type of Advance Directive Living will  Pre-existing out of facility DNR order (yellow form or pink MOST form) --  "MOST" Form in Place? --       Prognosis:  Hours to days  Discharge Planning:  Anticipated Hospital Death  Care plan was discussed with daughter  Thank you for allowing the Palliative Medicine Team to assist in the care of this patient.   Total Time 20 Prolonged Time Billed No   Greater than 50%  of this time was spent counseling and coordinating care related to the above assessment and plan.   [name], MD  Please contact Palliative Medicine Team phone at [phone] for questions and concerns.

## 2021-02-04 NOTE — Progress Notes (Signed)
PROGRESS NOTE    Tara Rodgers    Code Status: DNR  YBO:[id number] DOB: 06-03-1946 DOA: 01/15/2021 LOS: 20 days  PCP: Elby Showers, MD CC:  Chief Complaint  Patient presents with  . Emesis  . Nausea  . Dysphagia       Hospital Summary   This is a 75 year old female with past medical history of metastatic SCLC undergoing chemotherapy s/p XRT, transfusion dependent anemia, thrombocytopenia, right breast CA s/p mastectomy in 2016 on antiestrogen therapy who presented to the ED on 4/3 with complaints of 3 weeks of constant, worsening and severe dysphagia with globus sensation and odynophagia which limited her oral intake.  ED course: Tachycardic, afebrile, normotensive.  Ketones in urine, I7.7, metabolic acidosis and LFTs elevated.  4/17: Confused and unable to recognize her family.  Brain MRI with and without contrast- noted osseous metastatic disease however significant motion artifact 4/18: Still encephalopathic.  GI held off on planned esophageal stent.  Palliative care consulted and patient was made DNR 4/19: Continue Protonix twice daily.  Holding off on any procedures at this point.  Oncology recommending residential hospice. 4/21: patient unstable for transfer to a residential hospice facility    A & P   Active Problems:   History of malignant neoplasm of breast   Hypothyroidism   Small cell lung cancer ([place])   Port-A-Cath in place   Dysphagia   Malnutrition of moderate degree   Pressure injury of skin   Chemotherapy-induced neutropenia (HCC)   1. Failure to thrive   Acute encephalopathy, likely metabolic and toxic secondary to UTI, poor p.o. intake and end-stage cancer, hypernatremia a. Continue comfort measures only  2. Dysphagia  odynophagia  Oral thrush  3. Pancytopenia secondary to chemotherapy  stage IV small cell lung cancer a. Comfort measures only  4. History of breast cancer  5. Hypertension and tachycardia a. Comfort  care  6. Hypothyroidism a. Off synthroid   DVT prophylaxis: none   Family Communication: no family at bedside today  Disposition Plan: Suspect hospital death in the next 24-48 hours Status is: Inpatient  Remains inpatient appropriate because:Unsafe d/c plan   Dispo: The patient is from: Home              Anticipated d/c is to: anticipate hospital death in the coming days              Patient currently is not medically stable to d/c.   Difficult to place patient No           Pressure injury documentation   Pressure Injury 01/18/21 Sacrum Mid Stage 2 -  Partial thickness loss of dermis presenting as a shallow open injury with a red, pink wound bed without slough. small, round (Active)  01/18/21 2130  Location: Sacrum  Location Orientation: Mid  Staging: Stage 2 -  Partial thickness loss of dermis presenting as a shallow open injury with a red, pink wound bed without slough.  Wound Description (Comments): small, round  Present on Admission:     Consultants  GI Oncology Palliative  Procedures  None  Antibiotics   Anti-infectives (From admission, onward)   Start     Dose/Rate Route Frequency Ordered Stop   01/22/2021 1830  cefTRIAXone (ROCEPHIN) 1 g in sodium chloride 0.9 % 100 mL IVPB  Status:  Discontinued        1 g 200 mL/hr over 30 Minutes Intravenous Daily-1800 01/16/2021 1803 02/01/21 0958   01/16/21 1000  fluconazole (DIFLUCAN) IVPB 200  mg  Status:  Discontinued        200 mg 100 mL/hr over 60 Minutes Intravenous Daily 01/15/21 1024 02/01/21 0958   01/15/21 1300  cefTRIAXone (ROCEPHIN) 1 g in sodium chloride 0.9 % 100 mL IVPB  Status:  Discontinued        1 g 200 mL/hr over 30 Minutes Intravenous Every 24 hours 01/15/21 1210 01/21/21 1210   01/15/21 1100  fluconazole (DIFLUCAN) IVPB 400 mg        400 mg 100 mL/hr over 120 Minutes Intravenous  Once 01/15/21 1024 01/15/21 1326        Subjective   Patient resting comfortably. No family at bedside.  Anuric  Objective   Vitals:   02/01/21 0516 02/02/21 0629 02/03/21 0611 02/04/21 0500  BP: (!) 169/95 (!) 139/55 100/65 98/72  Pulse: (!) 111 (!) 118 (!) 121 (!) 130  Resp: 18 18 18 18   Temp: (!) 97.5 F (36.4 C) (!) 97.5 F (36.4 C) (!) 97.5 F (36.4 C) (!) 97.5 F (36.4 C)  TempSrc: Oral Oral Oral Axillary  SpO2: 100% 100% 97% 97%  Weight:      Height:        Intake/Output Summary (Last 24 hours) at 02/04/2021 1409 Last data filed at 02/03/2021 2217 Gross per 24 hour  Intake 3 ml  Output --  Net 3 ml   Filed Weights   01/25/21 0530 01/26/21 0700 01/27/21 0500  Weight: 49.8 kg 49.5 kg 48.7 kg    Examination:  Physical Exam Vitals and nursing note reviewed.  Constitutional:      General: She is not in acute distress. HENT:     Head: Normocephalic.  Cardiovascular:     Rate and Rhythm: Regular rhythm. Tachycardia present.  Pulmonary:     Effort: No respiratory distress.     Breath sounds: No wheezing.  Skin:    General: Skin is warm.     Coloration: Skin is pale.  Neurological:     Comments: obtunded     Data Reviewed: I have personally reviewed following labs and imaging studies  CBC: Recent Labs  Lab 01/28/21 2238 01/22/2021 0554 01/30/21 1140  WBC 2.2* 2.5* 2.6*  NEUTROABS  --  1.4*  --   HGB 8.4* 8.4* 8.2*  HCT 26.1* 26.0* 26.0*  MCV 84.5 84.4 86.7  PLT 136* 146* 025*   Basic Metabolic Panel: Recent Labs  Lab 01/28/21 2238 02/04/2021 0543 01/19/2021 0554 01/30/21 1140  NA 142 144  --  150*  K 4.2 4.2  --  3.8  CL 114* 115*  --  122*  CO2 20* 20*  --  17*  GLUCOSE 102* 104*  --  101*  BUN 11 11  --  10  CREATININE 0.73 0.75  --  0.61  CALCIUM 8.7* 8.9  --  8.8*  MG  --   --  1.6*  --   PHOS  --  3.4  --   --    GFR: Estimated Creatinine Clearance: 42.7 mL/min (by C-G formula based on SCr of 0.61 mg/dL). Liver Function Tests: Recent Labs  Lab 01/20/2021 0543  ALBUMIN 3.1*   No results for input(s): LIPASE, AMYLASE in the last 168  hours. No results for input(s): AMMONIA in the last 168 hours. Coagulation Profile: No results for input(s): INR, PROTIME in the last 168 hours. Cardiac Enzymes: No results for input(s): CKTOTAL, CKMB, CKMBINDEX, TROPONINI in the last 168 hours. BNP (last 3 results) No results for input(s): PROBNP  in the last 8760 hours. HbA1C: No results for input(s): HGBA1C in the last 72 hours. CBG: Recent Labs  Lab 01/28/21 1906 01/28/21 2057  GLUCAP 117* 103*   Lipid Profile: No results for input(s): CHOL, HDL, LDLCALC, TRIG, CHOLHDL, LDLDIRECT in the last 72 hours. Thyroid Function Tests: No results for input(s): TSH, T4TOTAL, FREET4, T3FREE, THYROIDAB in the last 72 hours. Anemia Panel: No results for input(s): VITAMINB12, FOLATE, FERRITIN, TIBC, IRON, RETICCTPCT in the last 72 hours. Sepsis Labs: No results for input(s): PROCALCITON, LATICACIDVEN in the last 168 hours.  Recent Results (from the past 240 hour(s))  Culture, blood (routine x 2)     Status: None   Collection Time: 01/28/2021  8:17 AM   Specimen: BLOOD  Result Value Ref Range Status   Specimen Description   Final    BLOOD BLOOD LEFT FOREARM Performed at [facility] [address], [place], [place] [address]    Special Requests   Final    BOTTLES DRAWN AEROBIC ONLY Blood Culture results may not be optimal due to an inadequate volume of blood received in culture bottles Performed at [facility] [address], [place], [place] [address]    Culture   Final    NO GROWTH 5 DAYS Performed at [facility], [place] [address], [place], [place] [address]    Report Status 02/03/2021 FINAL  Final  Culture, blood (routine x 2)     Status: None   Collection Time: 02/02/2021 10:08 AM   Specimen: BLOOD  Result Value Ref Range Status   Specimen Description   Final    BLOOD LEFT HAND Performed at [facility] [address] [place], [place] [address]    Special  Requests   Final    BOTTLES DRAWN AEROBIC ONLY Blood Culture adequate volume Performed at [facility] [address], [place], [place] [address]    Culture   Final    NO GROWTH 5 DAYS Performed at [facility], [place] [address], [place], [place] [address]    Report Status 02/03/2021 FINAL  Final  Culture, Urine     Status: Abnormal   Collection Time: 01/26/2021 10:52 AM   Specimen: Urine, Random  Result Value Ref Range Status   Specimen Description   Final    URINE, RANDOM Performed at [facility] [address], [place], [place] [address]    Special Requests   Final    NONE Performed at [facility], [place] [address], [place], [place] [address]    Culture >=100,000 COLONIES/mL Su Hoff (A)  Final   Report Status 02/01/2021 FINAL  Final   Organism ID, Bacteria CITROBACTER YOUNGAE (A)  Final      Susceptibility   Citrobacter youngae - MIC*    CEFAZOLIN RESISTANT Resistant     CEFEPIME <=0.12 SENSITIVE Sensitive     CEFTRIAXONE <=0.25 SENSITIVE Sensitive     CIPROFLOXACIN <=0.25 SENSITIVE Sensitive     GENTAMICIN <=1 SENSITIVE Sensitive     IMIPENEM <=0.25 SENSITIVE Sensitive     NITROFURANTOIN <=16 SENSITIVE Sensitive     TRIMETH/SULFA <=20 SENSITIVE Sensitive     PIP/TAZO <=4 SENSITIVE Sensitive     * >=100,000 COLONIES/mL CITROBACTER YOUNGAE         Radiology Studies: No results found.      Scheduled Meds: . Chlorhexidine Gluconate Cloth  6 each Topical Daily  . fentaNYL  1 patch Transdermal Q72H  .  mouth rinse  15 mL Mouth Rinse BID  . multivitamin with minerals  1 tablet Oral Daily  . pantoprazole (PROTONIX) IV  40 mg Intravenous Q12H  . sodium chloride flush  10-40 mL Intracatheter Q12H  . sodium chloride flush  3 mL Intravenous Q12H   Continuous Infusions:    Time spent: 15 minutes with over 50% of the time coordinating the patient's care    Harold Hedge, DO [facility]   Call night coverage person covering after 7pm

## 2021-02-05 ENCOUNTER — Ambulatory Visit: Payer: Medicare Other

## 2021-02-05 DIAGNOSIS — Z515 Encounter for palliative care: Secondary | ICD-10-CM | POA: Diagnosis not present

## 2021-02-05 LAB — CYTOLOGY - NON PAP

## 2021-02-05 NOTE — Progress Notes (Signed)
PROGRESS NOTE    Tara Rodgers    Code Status: DNR  JJO:[id number] DOB: 1946/10/12 DOA: 01/22/2021 LOS: 21 days  PCP: Elby Showers, MD CC:  Chief Complaint  Patient presents with  . Emesis  . Nausea  . Dysphagia       Hospital Summary   This is a 75 year old female with past medical history of metastatic SCLC undergoing chemotherapy s/p XRT, transfusion dependent anemia, thrombocytopenia, right breast CA s/p mastectomy in 2016 on antiestrogen therapy who presented to the ED on 4/3 with complaints of 3 weeks of constant, worsening and severe dysphagia with globus sensation and odynophagia which limited her oral intake.  ED course: Tachycardic, afebrile, normotensive.  Ketones in urine, Z6.0, metabolic acidosis and LFTs elevated.  4/17: Confused and unable to recognize her family.  Brain MRI with and without contrast- noted osseous metastatic disease however significant motion artifact 4/18: Still encephalopathic.  GI held off on planned esophageal stent.  Palliative care consulted and patient was made DNR 4/19: Continue Protonix twice daily.  Holding off on any procedures at this point.  Oncology recommending residential hospice. 4/21: patient unstable for transfer to a residential hospice facility    A & P   Active Problems:   History of malignant neoplasm of breast   Hypothyroidism   Small cell lung cancer ([place])   Port-A-Cath in place   Dysphagia   Malnutrition of moderate degree   Pressure injury of skin   Chemotherapy-induced neutropenia (HCC)   1. Failure to thrive   Acute encephalopathy, likely metabolic and toxic secondary to UTI, poor p.o. intake and end-stage cancer, hypernatremia a. Continue comfort measures only  2. Dysphagia  odynophagia  Oral thrush  3. Pancytopenia secondary to chemotherapy  stage IV small cell lung cancer a. Comfort measures only  4. History of breast cancer  5. Hypertension and tachycardia a. Comfort  care  6. Hypothyroidism a. Off synthroid   DVT prophylaxis: none   Family Communication: family friends at bedside  Disposition Plan: Suspect hospital death in the next 24 hours Status is: Inpatient  Remains inpatient appropriate because:Unsafe d/c plan   Dispo: The patient is from: Home              Anticipated d/c is to: anticipate hospital death in the coming days              Patient currently is not medically stable to d/c.   Difficult to place patient No           Pressure injury documentation   Pressure Injury 01/18/21 Sacrum Mid Stage 2 -  Partial thickness loss of dermis presenting as a shallow open injury with a red, pink wound bed without slough. small, round (Active)  01/18/21 2130  Location: Sacrum  Location Orientation: Mid  Staging: Stage 2 -  Partial thickness loss of dermis presenting as a shallow open injury with a red, pink wound bed without slough.  Wound Description (Comments): small, round  Present on Admission:     Consultants  GI Oncology Palliative  Procedures  None  Antibiotics   Anti-infectives (From admission, onward)   Start     Dose/Rate Route Frequency Ordered Stop   01/22/2021 1830  cefTRIAXone (ROCEPHIN) 1 g in sodium chloride 0.9 % 100 mL IVPB  Status:  Discontinued        1 g 200 mL/hr over 30 Minutes Intravenous Daily-1800 02/02/2021 1803 02/01/21 0958   01/16/21 1000  fluconazole (DIFLUCAN) IVPB 200 mg  Status:  Discontinued        200 mg 100 mL/hr over 60 Minutes Intravenous Daily 01/15/21 1024 02/01/21 0958   01/15/21 1300  cefTRIAXone (ROCEPHIN) 1 g in sodium chloride 0.9 % 100 mL IVPB  Status:  Discontinued        1 g 200 mL/hr over 30 Minutes Intravenous Every 24 hours 01/15/21 1210 01/21/21 1210   01/15/21 1100  fluconazole (DIFLUCAN) IVPB 400 mg        400 mg 100 mL/hr over 120 Minutes Intravenous  Once 01/15/21 1024 01/15/21 1326        Subjective   Resting comfortably in no acute distress.   Objective    Vitals:   02/02/21 0629 02/03/21 0611 02/04/21 0500 02/05/21 0731  BP: (!) 139/55 100/65 98/72 (!) 85/55  Pulse: (!) 118 (!) 121 (!) 130 (!) 128  Resp: 18 18 18 15   Temp: (!) 97.5 F (36.4 C) (!) 97.5 F (36.4 C) (!) 97.5 F (36.4 C) 98.5 F (36.9 C)  TempSrc: Oral Oral Axillary Oral  SpO2: 100% 97% 97% 94%  Weight:      Height:        Intake/Output Summary (Last 24 hours) at 02/05/2021 1459 Last data filed at 02/05/2021 1018 Gross per 24 hour  Intake 23 ml  Output --  Net 23 ml   Filed Weights   01/25/21 0530 01/26/21 0700 01/27/21 0500  Weight: 49.8 kg 49.5 kg 48.7 kg    Examination:  Physical Exam Vitals and nursing note reviewed.  Constitutional:      Comments: obtunded  HENT:     Mouth/Throat:     Mouth: Mucous membranes are dry.  Cardiovascular:     Rate and Rhythm: Regular rhythm. Tachycardia present.  Pulmonary:     Effort: No respiratory distress.  Skin:    Coloration: Skin is pale.     Data Reviewed: I have personally reviewed following labs and imaging studies  CBC: Recent Labs  Lab 01/30/21 1140  WBC 2.6*  HGB 8.2*  HCT 26.0*  MCV 86.7  PLT 967*   Basic Metabolic Panel: Recent Labs  Lab 01/30/21 1140  NA 150*  K 3.8  CL 122*  CO2 17*  GLUCOSE 101*  BUN 10  CREATININE 0.61  CALCIUM 8.8*   GFR: Estimated Creatinine Clearance: 42.7 mL/min (by C-G formula based on SCr of 0.61 mg/dL). Liver Function Tests: No results for input(s): AST, ALT, ALKPHOS, BILITOT, PROT, ALBUMIN in the last 168 hours. No results for input(s): LIPASE, AMYLASE in the last 168 hours. No results for input(s): AMMONIA in the last 168 hours. Coagulation Profile: No results for input(s): INR, PROTIME in the last 168 hours. Cardiac Enzymes: No results for input(s): CKTOTAL, CKMB, CKMBINDEX, TROPONINI in the last 168 hours. BNP (last 3 results) No results for input(s): PROBNP in the last 8760 hours. HbA1C: No results for input(s): HGBA1C in the last 72  hours. CBG: No results for input(s): GLUCAP in the last 168 hours. Lipid Profile: No results for input(s): CHOL, HDL, LDLCALC, TRIG, CHOLHDL, LDLDIRECT in the last 72 hours. Thyroid Function Tests: No results for input(s): TSH, T4TOTAL, FREET4, T3FREE, THYROIDAB in the last 72 hours. Anemia Panel: No results for input(s): VITAMINB12, FOLATE, FERRITIN, TIBC, IRON, RETICCTPCT in the last 72 hours. Sepsis Labs: No results for input(s): PROCALCITON, LATICACIDVEN in the last 168 hours.  Recent Results (from the past 240 hour(s))  Culture, blood (routine x 2)     Status: None  Collection Time: 01/25/2021  8:17 AM   Specimen: BLOOD  Result Value Ref Range Status   Specimen Description   Final    BLOOD BLOOD LEFT FOREARM Performed at [facility] [address], [place], [place] [address]    Special Requests   Final    BOTTLES DRAWN AEROBIC ONLY Blood Culture results may not be optimal due to an inadequate volume of blood received in culture bottles Performed at [facility] [address], [place], [place] [address]    Culture   Final    NO GROWTH 5 DAYS Performed at [facility], [place] [address], [place], [place] [address]    Report Status 02/03/2021 FINAL  Final  Culture, blood (routine x 2)     Status: None   Collection Time: 01/22/2021 10:08 AM   Specimen: BLOOD  Result Value Ref Range Status   Specimen Description   Final    BLOOD LEFT HAND Performed at [facility] [address], [place], [name] [address]    Special Requests   Final    BOTTLES DRAWN AEROBIC ONLY Blood Culture adequate volume Performed at [facility] [address], [place], [place] [address]    Culture   Final    NO GROWTH 5 DAYS Performed at [facility], [place] [address], [place], [place] [address]    Report Status 02/03/2021 FINAL  Final  Culture, Urine     Status: Abnormal   Collection Time: 02/10/2021 10:52 AM    Specimen: Urine, Random  Result Value Ref Range Status   Specimen Description   Final    URINE, RANDOM Performed at [facility] [address], [place], [place] [address]    Special Requests   Final    NONE Performed at [facility], [place] [address], [place],  [address]    Culture >=100,000 COLONIES/mL Su Hoff (A)  Final   Report Status 02/01/2021 FINAL  Final   Organism ID, Bacteria CITROBACTER YOUNGAE (A)  Final      Susceptibility   Citrobacter youngae - MIC*    CEFAZOLIN RESISTANT Resistant     CEFEPIME <=0.12 SENSITIVE Sensitive     CEFTRIAXONE <=0.25 SENSITIVE Sensitive     CIPROFLOXACIN <=0.25 SENSITIVE Sensitive     GENTAMICIN <=1 SENSITIVE Sensitive     IMIPENEM <=0.25 SENSITIVE Sensitive     NITROFURANTOIN <=16 SENSITIVE Sensitive     TRIMETH/SULFA <=20 SENSITIVE Sensitive     PIP/TAZO <=4 SENSITIVE Sensitive     * >=100,000 COLONIES/mL CITROBACTER YOUNGAE         Radiology Studies: No results found.      Scheduled Meds: . Chlorhexidine Gluconate Cloth  6 each Topical Daily  . fentaNYL  1 patch Transdermal Q72H  . mouth rinse  15 mL Mouth Rinse BID  . pantoprazole (PROTONIX) IV  40 mg Intravenous Q12H  . sodium chloride flush  10-40 mL Intracatheter Q12H  . sodium chloride flush  3 mL Intravenous Q12H   Continuous Infusions:    Time spent: 16 minutes with over 50% of the time coordinating the patient's care    Harold Hedge, DO [facility]   Call night coverage person covering after 7pm

## 2021-02-05 NOTE — Progress Notes (Signed)
Patient's port-a-cath due to be re-accessed today. Per Dr. Neysa Bonito ok to leave accessed due to patient's comfort care status. Dressing remains clean, dry and intact with antimicrobial disc in place.

## 2021-02-05 NOTE — Progress Notes (Signed)
Palliative Care Brief Note  I saw Tara Rodgers's family in the hall.  They denied any needs today.  I did not see or examine her today as she was resting comfortably per family report.  Continue comfort care. Anticipate hospital death.  [name], MD [facility] Team [phone]  No charge note

## 2021-02-06 DIAGNOSIS — R531 Weakness: Secondary | ICD-10-CM | POA: Diagnosis not present

## 2021-02-06 DIAGNOSIS — Z7189 Other specified counseling: Secondary | ICD-10-CM | POA: Diagnosis not present

## 2021-02-06 DIAGNOSIS — Z515 Encounter for palliative care: Secondary | ICD-10-CM | POA: Diagnosis not present

## 2021-02-06 NOTE — Progress Notes (Signed)
PROGRESS NOTE    Tara Rodgers    Code Status: DNR  KXF:[id number] DOB: 1946/03/01 DOA: 02/04/2021 LOS: 22 days  PCP: Elby Showers, MD CC:  Chief Complaint  Patient presents with  . Emesis  . Nausea  . Dysphagia       Hospital Summary   This is a 75 year old female with past medical history of metastatic SCLC undergoing chemotherapy s/p XRT, transfusion dependent anemia, thrombocytopenia, right breast CA s/p mastectomy in 2016 on antiestrogen therapy who presented to the ED on 4/3 with complaints of 3 weeks of constant, worsening and severe dysphagia with globus sensation and odynophagia which limited her oral intake.  ED course: Tachycardic, afebrile, normotensive.  Ketones in urine, I9.6, metabolic acidosis and LFTs elevated.  4/17: Confused and unable to recognize her family.  Brain MRI with and without contrast- noted osseous metastatic disease however significant motion artifact 4/18: Still encephalopathic.  GI held off on planned esophageal stent.  Palliative care consulted and patient was made DNR 4/19: Continue Protonix twice daily.  Holding off on any procedures at this point.  Oncology recommending residential hospice. 4/21: patient unstable for transfer to a residential hospice facility.   Patient has remained inpatient anticipating hospital death    A & P   Active Problems:   History of malignant neoplasm of breast   Hypothyroidism   Small cell lung cancer ([place])   Port-A-Cath in place   Dysphagia   Malnutrition of moderate degree   Pressure injury of skin   Chemotherapy-induced neutropenia (HCC)   1. Failure to thrive   Acute encephalopathy, likely metabolic and toxic secondary to UTI, poor p.o. intake and end-stage cancer, hypernatremia a. Continue comfort measures only- palliative anticipates hospital death in 1-2 days  2. Dysphagia  odynophagia  Oral thrush  3. Pancytopenia secondary to chemotherapy  stage IV small cell lung cancer a. Comfort  measures only  4. History of breast cancer  5. Hypertension and tachycardia a. Comfort care  6. Hypothyroidism a. Off synthroid   DVT prophylaxis: none   Family Communication: no family at bedside  Disposition Plan: Suspect hospital death in the next 24-48 hours Status is: Inpatient  Remains inpatient appropriate because:Unsafe d/c plan   Dispo: The patient is from: Home              Anticipated d/c is to: anticipate hospital death in the coming days              Patient currently is not medically stable to d/c.   Difficult to place patient No           Pressure injury documentation   Pressure Injury 01/18/21 Sacrum Mid Stage 2 -  Partial thickness loss of dermis presenting as a shallow open injury with a red, pink wound bed without slough. small, round (Active)  01/18/21 2130  Location: Sacrum  Location Orientation: Mid  Staging: Stage 2 -  Partial thickness loss of dermis presenting as a shallow open injury with a red, pink wound bed without slough.  Wound Description (Comments): small, round  Present on Admission:     Consultants  GI Oncology Palliative  Procedures  None  Antibiotics   Anti-infectives (From admission, onward)   Start     Dose/Rate Route Frequency Ordered Stop   01/30/2021 1830  cefTRIAXone (ROCEPHIN) 1 g in sodium chloride 0.9 % 100 mL IVPB  Status:  Discontinued        1 g 200 mL/hr over 30 Minutes  Intravenous Daily-1800 01/22/2021 1803 02/01/21 0958   01/16/21 1000  fluconazole (DIFLUCAN) IVPB 200 mg  Status:  Discontinued        200 mg 100 mL/hr over 60 Minutes Intravenous Daily 01/15/21 1024 02/01/21 0958   01/15/21 1300  cefTRIAXone (ROCEPHIN) 1 g in sodium chloride 0.9 % 100 mL IVPB  Status:  Discontinued        1 g 200 mL/hr over 30 Minutes Intravenous Every 24 hours 01/15/21 1210 01/21/21 1210   01/15/21 1100  fluconazole (DIFLUCAN) IVPB 400 mg        400 mg 100 mL/hr over 120 Minutes Intravenous  Once 01/15/21 1024 01/15/21  1326        Subjective   Resting comfortably no acute distress  Objective   Vitals:   02/03/21 0611 02/04/21 0500 02/05/21 0731 02/06/21 0516  BP: 100/65 98/72 (!) 85/55 (!) 99/24  Pulse: (!) 121 (!) 130 (!) 128 72  Resp: 18 18 15  (!) 22  Temp: (!) 97.5 F (36.4 C) (!) 97.5 F (36.4 C) 98.5 F (36.9 C) 98 F (36.7 C)  TempSrc: Oral Axillary Oral Oral  SpO2: 97% 97% 94% 94%  Weight:      Height:        Intake/Output Summary (Last 24 hours) at 02/06/2021 1252 Last data filed at 02/05/2021 2325 Gross per 24 hour  Intake 13 ml  Output --  Net 13 ml   Filed Weights   01/25/21 0530 01/26/21 0700 01/27/21 0500  Weight: 49.8 kg 49.5 kg 48.7 kg    Examination:  Physical Exam Vitals and nursing note reviewed.  Constitutional:      Comments: obtunded  HENT:     Head: Normocephalic.     Mouth/Throat:     Mouth: Mucous membranes are dry.  Cardiovascular:     Rate and Rhythm: Normal rate and regular rhythm.  Pulmonary:     Effort: Pulmonary effort is normal. No respiratory distress.  Abdominal:     General: There is no distension.  Musculoskeletal:        General: No swelling.     Data Reviewed: I have personally reviewed following labs and imaging studies  CBC: No results for input(s): WBC, NEUTROABS, HGB, HCT, MCV, PLT in the last 168 hours. Basic Metabolic Panel: No results for input(s): NA, K, CL, CO2, GLUCOSE, BUN, CREATININE, CALCIUM, MG, PHOS in the last 168 hours. GFR: Estimated Creatinine Clearance: 42.7 mL/min (by C-G formula based on SCr of 0.61 mg/dL). Liver Function Tests: No results for input(s): AST, ALT, ALKPHOS, BILITOT, PROT, ALBUMIN in the last 168 hours. No results for input(s): LIPASE, AMYLASE in the last 168 hours. No results for input(s): AMMONIA in the last 168 hours. Coagulation Profile: No results for input(s): INR, PROTIME in the last 168 hours. Cardiac Enzymes: No results for input(s): CKTOTAL, CKMB, CKMBINDEX, TROPONINI in the  last 168 hours. BNP (last 3 results) No results for input(s): PROBNP in the last 8760 hours. HbA1C: No results for input(s): HGBA1C in the last 72 hours. CBG: No results for input(s): GLUCAP in the last 168 hours. Lipid Profile: No results for input(s): CHOL, HDL, LDLCALC, TRIG, CHOLHDL, LDLDIRECT in the last 72 hours. Thyroid Function Tests: No results for input(s): TSH, T4TOTAL, FREET4, T3FREE, THYROIDAB in the last 72 hours. Anemia Panel: No results for input(s): VITAMINB12, FOLATE, FERRITIN, TIBC, IRON, RETICCTPCT in the last 72 hours. Sepsis Labs: No results for input(s): PROCALCITON, LATICACIDVEN in the last 168 hours.  Recent Results (from  the past 240 hour(s))  Culture, blood (routine x 2)     Status: None   Collection Time: 01/17/2021  8:17 AM   Specimen: BLOOD  Result Value Ref Range Status   Specimen Description   Final    BLOOD BLOOD LEFT FOREARM Performed at [facility] [address], [place], [place] [address]    Special Requests   Final    BOTTLES DRAWN AEROBIC ONLY Blood Culture results may not be optimal due to an inadequate volume of blood received in culture bottles Performed at [facility] [address], [place], [place] [address]    Culture   Final    NO GROWTH 5 DAYS Performed at [facility], [place] [address], [place], [place] [address]    Report Status 02/03/2021 FINAL  Final  Culture, blood (routine x 2)     Status: None   Collection Time: 02/02/2021 10:08 AM   Specimen: BLOOD  Result Value Ref Range Status   Specimen Description   Final    BLOOD LEFT HAND Performed at [facility] [address] [place], [place] [address]    Special Requests   Final    BOTTLES DRAWN AEROBIC ONLY Blood Culture adequate volume Performed at [facility] [address], [place], [place] [address]    Culture   Final    NO GROWTH 5 DAYS Performed at [facility], [place]  [address], [place], [place] [address]    Report Status 02/03/2021 FINAL  Final  Culture, Urine     Status: Abnormal   Collection Time: 02/09/2021 10:52 AM   Specimen: Urine, Random  Result Value Ref Range Status   Specimen Description   Final    URINE, RANDOM Performed at [facility] [address] [place], [place] [address]    Special Requests   Final    NONE Performed at [facility], [place] [address], [place], [place] [address]    Culture >=100,000 COLONIES/mL Su Hoff (A)  Final   Report Status 02/01/2021 FINAL  Final   Organism ID, Bacteria CITROBACTER YOUNGAE (A)  Final      Susceptibility   Citrobacter youngae - MIC*    CEFAZOLIN RESISTANT Resistant     CEFEPIME <=0.12 SENSITIVE Sensitive     CEFTRIAXONE <=0.25 SENSITIVE Sensitive     CIPROFLOXACIN <=0.25 SENSITIVE Sensitive     GENTAMICIN <=1 SENSITIVE Sensitive     IMIPENEM <=0.25 SENSITIVE Sensitive     NITROFURANTOIN <=16 SENSITIVE Sensitive     TRIMETH/SULFA <=20 SENSITIVE Sensitive     PIP/TAZO <=4 SENSITIVE Sensitive     * >=100,000 COLONIES/mL CITROBACTER YOUNGAE         Radiology Studies: No results found.      Scheduled Meds: . Chlorhexidine Gluconate Cloth  6 each Topical Daily  . fentaNYL  1 patch Transdermal Q72H  . mouth rinse  15 mL Mouth Rinse BID  . pantoprazole (PROTONIX) IV  40 mg Intravenous Q12H  . sodium chloride flush  10-40 mL Intracatheter Q12H  . sodium chloride flush  3 mL Intravenous Q12H   Continuous Infusions:    Time spent: 15 minutes with over 50% of the time coordinating the patient's care    Harold Hedge, DO [facility]   Call night coverage person covering after 7pm

## 2021-02-06 NOTE — Progress Notes (Signed)
Daily Progress Note   Patient Name: Tara Rodgers       Date: 02/06/2021 DOB: [birth year]  Age: 75 y.o. MRN#: [id number] Attending Physician: Harold Hedge, MD Primary Care Physician: Elby Showers, MD Admit Date: 02/04/2021  Reason for Consultation/Follow-up: Establishing goals of care  Subjective: I saw and examined Tara Rodgers today,she is actively dying and unresponsive.  Discussed with bedside RN.  Length of Stay: 22  Current Medications: Scheduled Meds:  . Chlorhexidine Gluconate Cloth  6 each Topical Daily  . fentaNYL  1 patch Transdermal Q72H  . mouth rinse  15 mL Mouth Rinse BID  . pantoprazole (PROTONIX) IV  40 mg Intravenous Q12H  . sodium chloride flush  10-40 mL Intracatheter Q12H  . sodium chloride flush  3 mL Intravenous Q12H    Continuous Infusions:   PRN Meds: antiseptic oral rinse, glycopyrrolate **OR** glycopyrrolate **OR** glycopyrrolate, haloperidol **OR** haloperidol **OR** haloperidol lactate, LORazepam **OR** LORazepam **OR** LORazepam, morphine injection, ondansetron (ZOFRAN) IV, polyvinyl alcohol, sodium chloride flush  Physical Exam         General: Somnolent, unresponsive. HEENT: No bruits, no goiter, no JVD Heart: Tachycardic. No murmur appreciated. Lungs: Fair air movement, apenic periods noted  Ext: No significant edema Skin: Warm and dry   Vital Signs: BP (!) 99/24 (BP Location: Right Leg)   Pulse 72   Temp 98 F (36.7 C) (Oral)   Resp (!) 22   Ht 4\' 11"  (1.499 m)   Wt 48.7 kg   SpO2 94%   BMI 21.68 kg/m  SpO2: SpO2: 94 % O2 Device: O2 Device: Room Air O2 Flow Rate: O2 Flow Rate (L/min): 10 L/min  Intake/output summary:   Intake/Output Summary (Last 24 hours) at 02/06/2021 1148 Last data filed at 02/05/2021 2325 Gross per 24  hour  Intake 13 ml  Output --  Net 13 ml   LBM: Last BM Date: 01/22/21 Baseline Weight: Weight: 51 kg Most recent weight: Weight: 48.7 kg       Palliative Assessment/Data:      Patient Active Problem List   Diagnosis Date Noted  . Chemotherapy-induced neutropenia ([place])   . Pressure injury of skin 01/19/2021  . Malnutrition of moderate degree 01/16/2021  . Dysphagia 02/06/2021  . Port-A-Cath in place 12/04/2020  . Small cell lung cancer ([place]) 11/16/2020  .  Goals of care, counseling/discussion 11/16/2020  . Sinus tachycardia 11/16/2019  . Left carotid bruit 11/16/2019  . Stiffness of right knee 04/01/2018  . Osteoarthritis of knee 03/13/2018  . History of total knee arthroplasty 03/13/2018  . Genetic testing 02/13/2015  . Abnormal chromosomal and genetic finding on antenatal screening mother 02/13/2015  . 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 Hypertensive disorder 09/19/2012  . History of malignant neoplasm of breast 09/19/2012  . Asthma 09/19/2012  . Allergic rhinitis 09/19/2012  . Gastroesophageal reflux disease 09/19/2012  . Anxiety 09/19/2012  . Hyperlipidemia 09/19/2012  . Hypothyroidism 09/19/2012  . Osteoporosis 09/19/2012    Palliative Care Assessment & Plan   Recommendations/Plan: DNR/DNI- Full comfort moving forward For pain, continue morphine 2mg  as needed, medication history noted, patient required 2 doses of IV morphine in the past 24 hours. Discussed terminal agitation and would recommend Haldol as needed if symptoms recur. She is actively dying.  Anticipate hospital death.  Prognosis appears to be hours to some very limited number of days-1 to 2 days in my opinion.  Goals of Care and Additional Recommendations: Limitations on Scope of Treatment: Full Comfort Care  Code Status:    Code Status Orders  (From admission, onward)         Start     Ordered   01/30/21 1437  Do not attempt resuscitation (DNR)   Continuous       Question Answer Comment  In the event of cardiac or respiratory ARREST Do not call a "code blue"   In the event of cardiac or respiratory ARREST Do not perform Intubation, CPR, defibrillation or ACLS   In the event of cardiac or respiratory ARREST Use medication by any route, position, wound care, and other measures to relive pain and suffering. May use oxygen, suction and manual treatment of airway obstruction as needed for comfort.      01/30/21 1438        Code Status History    Date Active Date Inactive Code Status Order ID Comments User Context   01/28/2021 1938 01/30/2021 1438 DNR [id number]  Patrecia Pour, MD Inpatient   03/13/2018 1743 03/15/2018 1455 Full Code [id number]  Lajean Manes, PA-C Inpatient   Advance Care Planning Activity    Advance Directive Documentation   Flowsheet Row Most Recent Value  Type of Advance Directive Living will  Pre-existing out of facility DNR order (yellow form or pink MOST form) --  "MOST" Form in Place? --      Prognosis: Hours to days  Discharge Planning: Anticipated Hospital Death  Care plan was discussed with interdisciplinary team  Thank you for allowing the Palliative Medicine Team to assist in the care of this patient.   Total Time 25 Prolonged Time Billed No   Greater than 50%  of this time was spent counseling and coordinating care related to the above assessment and plan.   Loistine Chance, MD  Please contact Palliative Medicine Team phone at [phone] for questions and concerns.

## 2021-02-07 DIAGNOSIS — Z515 Encounter for palliative care: Secondary | ICD-10-CM | POA: Diagnosis not present

## 2021-02-07 DIAGNOSIS — R0602 Shortness of breath: Secondary | ICD-10-CM

## 2021-02-07 NOTE — Progress Notes (Signed)
Daily Progress Note   Patient Name: Tara Rodgers       Date: 02/07/2021 DOB: Jul 01, 1946  Age: 75 y.o. MRN#: [id number] Attending Physician: Mariel Aloe, MD Primary Care Physician: Elby Showers, MD Admit Date: 01/22/2021  Reason for Consultation/Follow-up: Establishing goals of care  Subjective: I saw and examined [name] today,she is actively dying and unresponsive.    Length of Stay: 23  Current Medications: Scheduled Meds:  . Chlorhexidine Gluconate Cloth  6 each Topical Daily  . fentaNYL  1 patch Transdermal Q72H  . mouth rinse  15 mL Mouth Rinse BID  . pantoprazole (PROTONIX) IV  40 mg Intravenous Q12H  . sodium chloride flush  10-40 mL Intracatheter Q12H  . sodium chloride flush  3 mL Intravenous Q12H    Continuous Infusions:   PRN Meds: antiseptic oral rinse, glycopyrrolate **OR** glycopyrrolate **OR** glycopyrrolate, haloperidol **OR** haloperidol **OR** haloperidol lactate, LORazepam **OR** LORazepam **OR** LORazepam, morphine injection, ondansetron (ZOFRAN) IV, polyvinyl alcohol, sodium chloride flush  Physical Exam         General: Somnolent, unresponsive. HEENT: No bruits, no goiter, no JVD Heart: Tachycardic. No murmur appreciated. Lungs: Fair air movement, apenic periods  Ext: No significant edema Skin: Warm and dry   Vital Signs: BP (!) 84/48 (BP Location: Left Arm)   Pulse (!) 128   Temp 98.9 F (37.2 C) (Oral)   Resp 18   Ht 4\' 11"  (1.499 m)   Wt 48.7 kg   SpO2 94%   BMI 21.68 kg/m  SpO2: SpO2: 94 % O2 Device: O2 Device: Room Air O2 Flow Rate: O2 Flow Rate (L/min): 10 L/min  Intake/output summary:   Intake/Output Summary (Last 24 hours) at 02/07/2021 5374 Last data filed at 02/07/2021 8270 Gross per 24 hour  Intake 0 ml  Output --   Net 0 ml   LBM: Last BM Date: 01/22/21 Baseline Weight: Weight: 51 kg Most recent weight: Weight: 48.7 kg       Palliative Assessment/Data:      Patient Active Problem List   Diagnosis Date Noted  . Chemotherapy-induced neutropenia ([name])   . Pressure injury of skin 01/19/2021  . Malnutrition of moderate degree 01/16/2021  . Dysphagia 02/10/2021  . Port-A-Cath in place 12/04/2020  . Small cell lung cancer ([name]) 11/16/2020  . Goals of  care, counseling/discussion 11/16/2020  . Sinus tachycardia 11/16/2019  . Left carotid bruit 11/16/2019  . Stiffness of right knee 04/01/2018  . Osteoarthritis of knee 03/13/2018  . History of total knee arthroplasty 03/13/2018  . Genetic testing 02/13/2015  . Abnormal chromosomal and genetic finding on antenatal screening mother 02/13/2015  . 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 Hypertensive disorder 09/19/2012  . History of malignant neoplasm of breast 09/19/2012  . Asthma 09/19/2012  . Allergic rhinitis 09/19/2012  . Gastroesophageal reflux disease 09/19/2012  . Anxiety 09/19/2012  . Hyperlipidemia 09/19/2012  . Hypothyroidism 09/19/2012  . Osteoporosis 09/19/2012    Palliative Care Assessment & Plan   Recommendations/Plan:  DNR/DNI- Full comfort measures.   For pain, continue morphine 2mg  as needed, medication history noted   If  terminal agitation would recommend Haldol as needed if symptoms recur.  She is actively dying.  Anticipate hospital death.  Prognosis appears to be hours to some very limited number of days-1 to 2 days in my opinion. Patient continues in the active stages of dying, unstable to be moved to hospice house in my opinion.   Goals of Care and Additional Recommendations:  Limitations on Scope of Treatment: Full Comfort Care  Code Status: .    Code Status Orders  (From admission, onward)         Start     Ordered   01/30/21 1437  Do not attempt resuscitation (DNR)   Continuous       Question Answer Comment  In the event of cardiac or respiratory ARREST Do not call a "code blue"   In the event of cardiac or respiratory ARREST Do not perform Intubation, CPR, defibrillation or ACLS   In the event of cardiac or respiratory ARREST Use medication by any route, position, wound care, and other measures to relive pain and suffering. May use oxygen, suction and manual treatment of airway obstruction as needed for comfort.      01/30/21 1438        Code Status History    Date Active Date Inactive Code Status Order ID Comments User Context   02/01/2021 1938 01/30/2021 1438 DNR [id number]  Patrecia Pour, MD Inpatient   03/13/2018 1743 03/15/2018 1455 Full Code [id number]  Lajean Manes, PA-C Inpatient   Advance Care Planning Activity    Advance Directive Documentation   Flowsheet Row Most Recent Value  Type of Advance Directive Living will  Pre-existing out of facility DNR order (yellow form or pink MOST form) --  "MOST" Form in Place? --       Prognosis:  Hours to days  Discharge Planning:  Anticipated Hospital Death  Care plan was discussed with interdisciplinary team  Thank you for allowing the Palliative Medicine Team to assist in the care of this patient.   Total Time 25 Prolonged Time Billed No   Greater than 50%  of this time was spent counseling and coordinating care related to the above assessment and plan.   Loistine Chance, MD  Please contact Palliative Medicine Team phone at [phone] for questions and concerns.

## 2021-02-07 NOTE — Progress Notes (Signed)
    CC: Emesis, nausea, dysphagia  Brief hospital summary: Patient is a 75 year old female with a history of metastatic small cell lung cancer on chemotherapy and XRT, transfusion dependent anemia, thrombocytopenia, right breast cancer status post mastectomy on antiestrogen therapy.  Patient presented secondary to worsening dysphagia with odynophagia and failure to thrive.  While admitted, patient became significantly confused.  Palliative care was consulted for goals of care discussions and patient was eventually transitioned to DNR status in addition to full comfort measures.  Subjective: No issues noted overnight.  Objective: BP (!) 84/48 (BP Location: Left Arm)   Pulse (!) 128   Temp 98.9 F (37.2 C) (Oral)   Resp 18   Ht 4\' 11"  (1.499 m)   Wt 48.7 kg   SpO2 94%   BMI 21.68 kg/m    Physical exam: General exam: Appears calm and comfortable Respiratory system: Clear to auscultation. Respiratory effort normal. Cardiovascular system: S1 & S2 heard, RRR. No murmurs, rubs, gallops or clicks.  Central nervous system: Not responsive to verbal or light tactile stimulation  Assessment/Plan:  Failure to thrive In setting of below problems. Now comfort measures.  Acute metabolic/toxic encephalopathy In setting of UTI, poor oral intake, end stage cancer and hypernatremia. Encephalopathy did not improve.  Dysphagia Odynophagia Oral thrush No treatment. Comfort measures  Pancytopenia Likely secondary to chemotherapy/cancer. Patient now comfort measures.  History of breast cancer Noted. Comfort measures.  Primary hypertension No treatment. Comfort measures.  Hypothyroidism No treatment. Comfort measures.  Comfort measures only -Continue Fentanyl patch 12 mcg q 72 hours -Continue Morphine Ativan, Haldol, [place]ul prn -Vitals daily only   Cordelia Poche, MD [facility] 02/07/2021, 1:53 PM

## 2021-02-11 NOTE — Progress Notes (Signed)
During hourly rounds, RN found patient to be not breathing. Assessment found pupils fixed and dilated. Carotid, apical, radial, femoral, and dorsalis pedis pulses absent. Respirations absent. Patient pronounced clinically dead on 2021/02/23 at 0156 with a second RN at bedside. Spoke with the night on-call MD Mitzi Hansen, who replied and stated that "Dr. Lonny Prude" will be signing the death certificate electronically. Patient's daughter [name] was notified at 0200 of her mother's death. Patient's belongings were placed at the front desk and daughter will be notified in the morning around 0700 that they are here if she would like to pick them up.

## 2021-02-11 NOTE — Death Summary Note (Signed)
DEATH SUMMARY   Patient Details  Name: Tara Rodgers MRN: [id number] DOB: 04-24-1946  Admission/Discharge Information   Admit Date:  Feb 12, 2021  Date of Death: Date of Death: 2021/03/09  Time of Death: Time of Death: 0156  Length of Stay: February 03, 2023  Referring Physician: Elby Showers, MD   Reason(s) for Hospitalization  Nausea, emesis, dysphagia  Diagnoses  Preliminary cause of death:  Secondary Diagnoses (including complications and co-morbidities):  Active Problems:   History of malignant neoplasm of breast   Hypothyroidism   Small cell lung cancer (HCC)   Port-A-Cath in place   Dysphagia   Malnutrition of moderate degree   Pressure injury of skin   Chemotherapy-induced neutropenia (HCC)   Comfort measures only status   Brief Hospital Course (including significant findings, care, treatment, and services provided and events leading to death)  Tara Rodgers is a 75 y.o. 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  Patient presented secondary to worsening dysphagia with odynophagia and failure to thrive.  While admitted, patient became significantly confused.  Palliative care was consulted for goals of care discussions and patient was eventually transitioned to DNR status in addition to full comfort measures.   Dysphagia Odynophagia Oral thrush Upper endoscopy significant for non-malignant appearing stricture which is likely radiation induced. No stent.  Pancytopenia Likely secondary to chemotherapy/cancer. Symptomatic management while inpatient.  History of breast cancer Noted.  Small cell lung cancer Start on chemotherapy for lung cancer.  Primary hypertension No treatment. Comfort measures.  Hypothyroidism No treatment. Comfort measures.  Comfort measures only Patient's symptoms managed with  continued Fentanyl patch and as needed Morphine, Ativan, Haldol and Robinul.   Pertinent Labs and Studies  Significant Diagnostic Studies DG Chest 2 View  Result Date: Feb 12, 2021 CLINICAL DATA:  Dysphagia and chest pain with nausea and gagging as well as vomiting 3 weeks. History of metastatic small cell lung cancer with last chemotherapy 2 weeks ago. EXAM: CHEST - 2 VIEW COMPARISON:  11/13/2020 and CT 01/01/2021 FINDINGS: Right IJ Port-A-Cath has tip over the SVC. Lungs are adequately inflated without focal airspace consolidation or effusion. Cardiomediastinal silhouette is normal. Stable moderate sclerotic compression fractures over the thoracic and lumbar spine compared to 01/01/2021, although these are new compared to 10/26/2020. IMPRESSION: 1.  No acute cardiopulmonary disease. 2. Several moderate sclerotic compression fractures of the thoracic and lumbar spine new since January 2022 but unchanged from 01/01/2021 likely metastatic in origin. Electronically Signed   By: Marin Olp M.D.   On: 12-Feb-2021 14:58   MR BRAIN W WO CONTRAST  Result Date: 01/28/2021 CLINICAL DATA:  Metastatic disease evaluation. EXAM: MRI HEAD WITHOUT AND WITH CONTRAST TECHNIQUE: Multiplanar, multiecho pulse sequences of the brain and surrounding structures were obtained without and with intravenous contrast. CONTRAST:  38mL GADAVIST GADOBUTROL 1 MMOL/ML IV SOLN COMPARISON:  Brain MRI 12/01/2020. FINDINGS: Brain: Multiple sequences are significantly motion degraded, limiting evaluation. Most notably, there is moderate/severe motion degradation of the axial T2/FLAIR sequence moderate/severe motion degradation of the axial T1 weighted postcontrast sequence and moderate/severe motion degradation of the sagittal T1 weighted postcontrast sequence. Mild cerebral and cerebellar atrophy. Mild multifocal T2/FLAIR hyperintensity within the cerebral white matter is nonspecific, but compatible with chronic small vessel ischemic disease.  Within the limitations of motion degradation, no enhancing intracranial metastatic disease is identified. There is no acute infarct. No evidence of  intracranial mass. No appreciable chronic intracranial blood products. No extra-axial fluid collection. No midline shift. Vascular: Expected proximal arterial flow voids. Skull and upper cervical spine: Redemonstrated scattered enhancing calvarial lesions. As before, there is a lesion in the low right occipital calvarium, encircling the hypoglossal canal (without clear canal infiltration). Unchanged dominant 18 mm lesion within the right parietal calvarium. This lesion involves the inner table without visible underlying dural thickening. Sinuses/Orbits: Visualized orbits show no acute finding. T2 hyperintense opacification of an anterior right ethmoid air cell. Other: Right mastoid IMPRESSION: Multiple sequences are significantly motion degraded (including some of the acquired post-contrast imaging), as described and limiting evaluation. Unchanged osseous metastatic disease, most notably at the right skull base (where a lesion encircles the right hypoglossal canal), and within the right parietal calvarium. As before, this dominant right parietal calvarial lesion involves the inner table, but there is no appreciable underlying dural thickening. Within described limitations, no enhancing brain metastases are identified. Right ethmoid sinusitis. Right mastoid effusion. Electronically Signed   By: Kellie Simmering DO   On: 01/28/2021 19:58   DG CHEST PORT 1 VIEW  Result Date: 02/07/2021 CLINICAL DATA:  Pneumonia EXAM: PORTABLE CHEST 1 VIEW COMPARISON:  01/13/2021 FINDINGS: Stable mild elevation of left hemidiaphragm. No consolidation. No pleural effusion or pneumothorax. Stable cardiomediastinal contours. Right chest wall port catheter is unchanged. Surgical clips overlie the right axilla and breast. IMPRESSION: Lungs are clear by radiograph. Findings on prior chest CT may  have improved or are not visible. Electronically Signed   By: Macy Mis M.D.   On: 01/17/2021 08:31   US Abdomen Limited RUQ (LIVER/GB)  Result Date: 01/15/2021 CLINICAL DATA:  Elevated LFTs EXAM: ULTRASOUND ABDOMEN LIMITED RIGHT UPPER QUADRANT COMPARISON:  None. FINDINGS: Gallbladder: No gallstones or wall thickening visualized. No sonographic Murphy sign noted by sonographer. Common bile duct: Diameter: Normal at 1.5 mm Liver: Small subtly hypoechoic solid lesion in the posterosuperior aspect of the right liver measures 1.2 x 1.1 cm. Hepatic parenchymal echogenicity and echotexture within normal limits. Portal vein is patent on color Doppler imaging with normal direction of blood flow towards the liver. Other: None. IMPRESSION: 1. Small nonspecific solid 1.2 cm hypoechoic lesion in the posterosuperior aspect of the right liver. Differential considerations are broad and include both malignant and benign neoplastic processes. Recommend further evaluation with gadolinium-enhanced MRI of the abdomen. 2. Otherwise, unremarkable right upper quadrant ultrasound. Electronically Signed   By: Jacqulynn Cadet M.D.   On: 01/15/2021 06:37    Procedures/Operations   Upper endoscopy (01/22/2021)   Cordelia Poche 02/26/2021, 3:37 PM

## 2021-02-11 DEATH — deceased

## 2021-02-15 ENCOUNTER — Other Ambulatory Visit: Payer: PPO | Admitting: Internal Medicine

## 2021-02-19 ENCOUNTER — Encounter: Payer: PPO | Admitting: Internal Medicine

## 2021-03-29 ENCOUNTER — Ambulatory Visit: Payer: PPO | Admitting: Nurse Practitioner

## 2021-03-29 ENCOUNTER — Ambulatory Visit: Payer: Self-pay | Admitting: Nurse Practitioner

## 2021-03-29 ENCOUNTER — Other Ambulatory Visit: Payer: Medicare Other

## 2021-03-29 ENCOUNTER — Other Ambulatory Visit: Payer: PPO

## 2022-02-17 IMAGING — CR DG CHEST 2V
2 series · 2 of 2 positions shown · non-contrast
Comparison: Radiograph 03/14/2009, CT 09/26/2006

CLINICAL DATA: Right lower lobe pneumonia, chest pain and cough
since [REDACTED]

EXAM:
CHEST - 2 VIEW

[w chest pa]
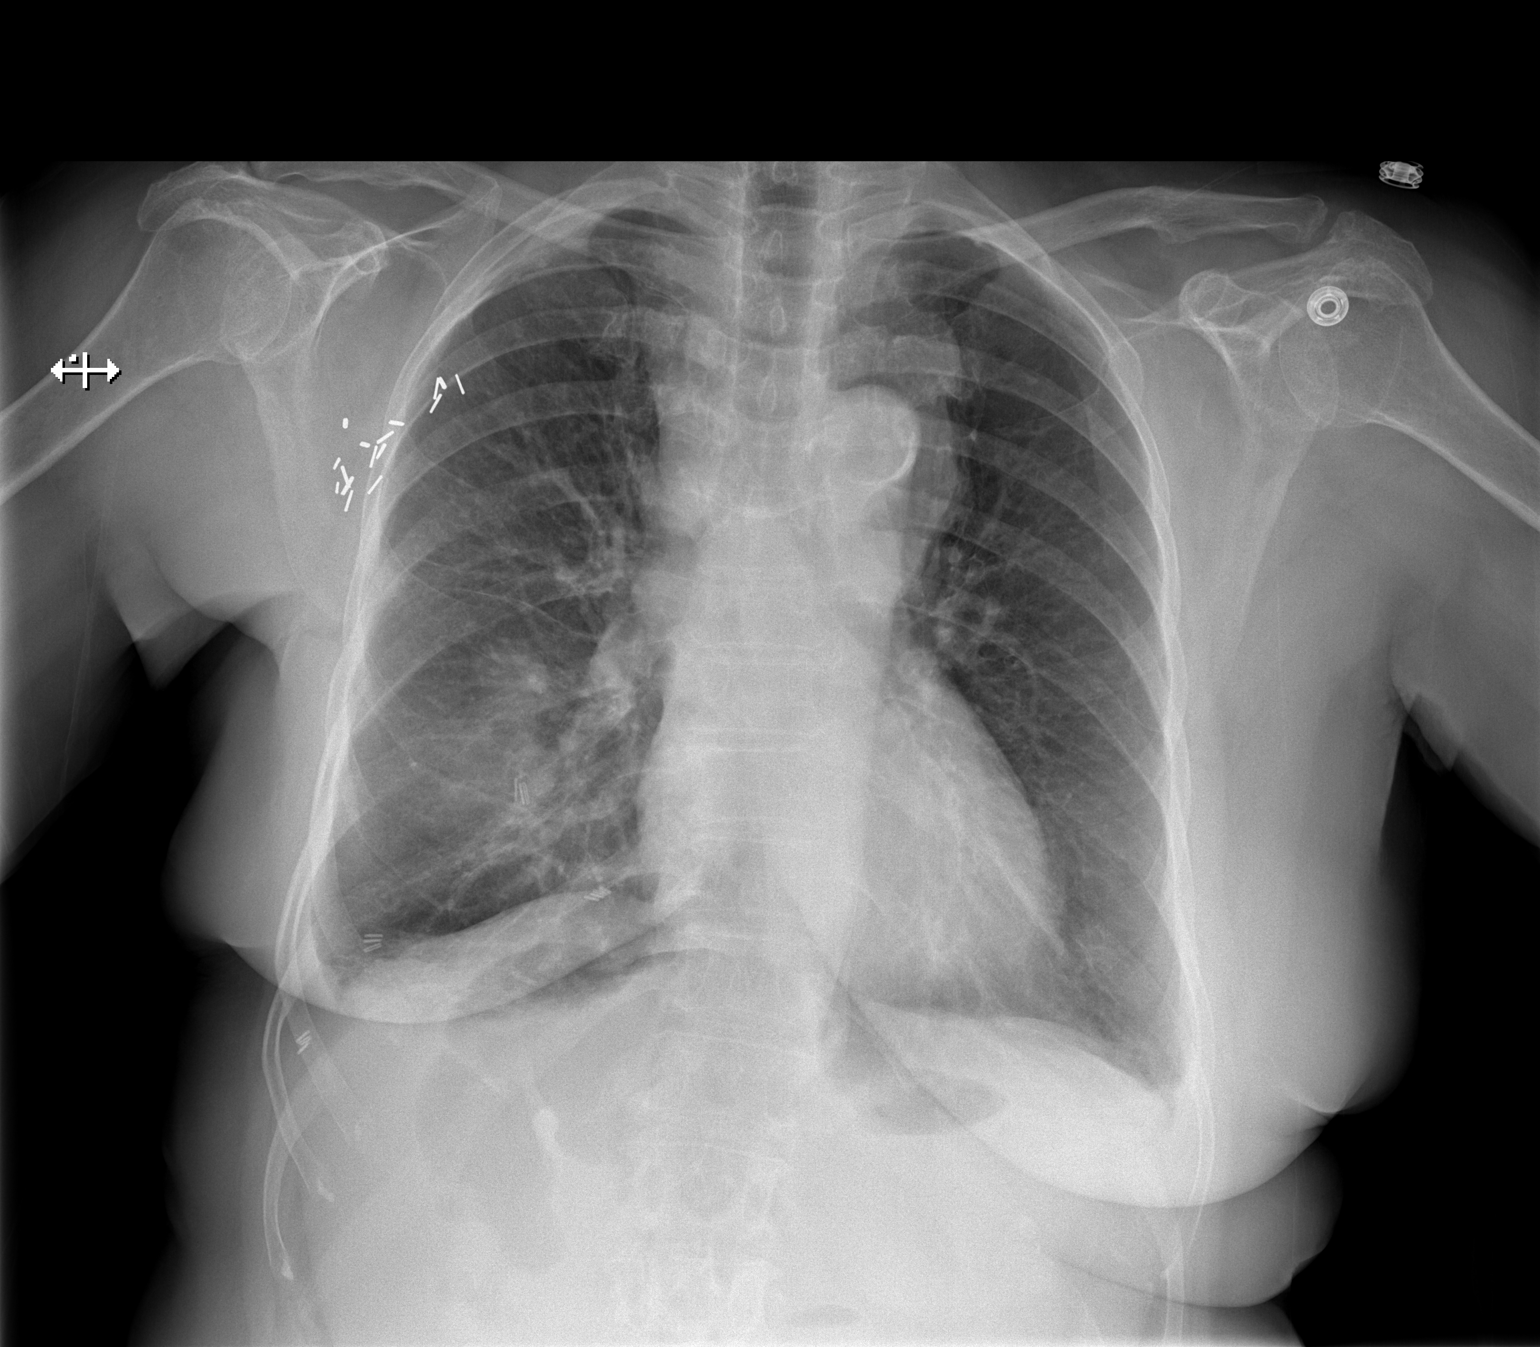

[w chest lat]
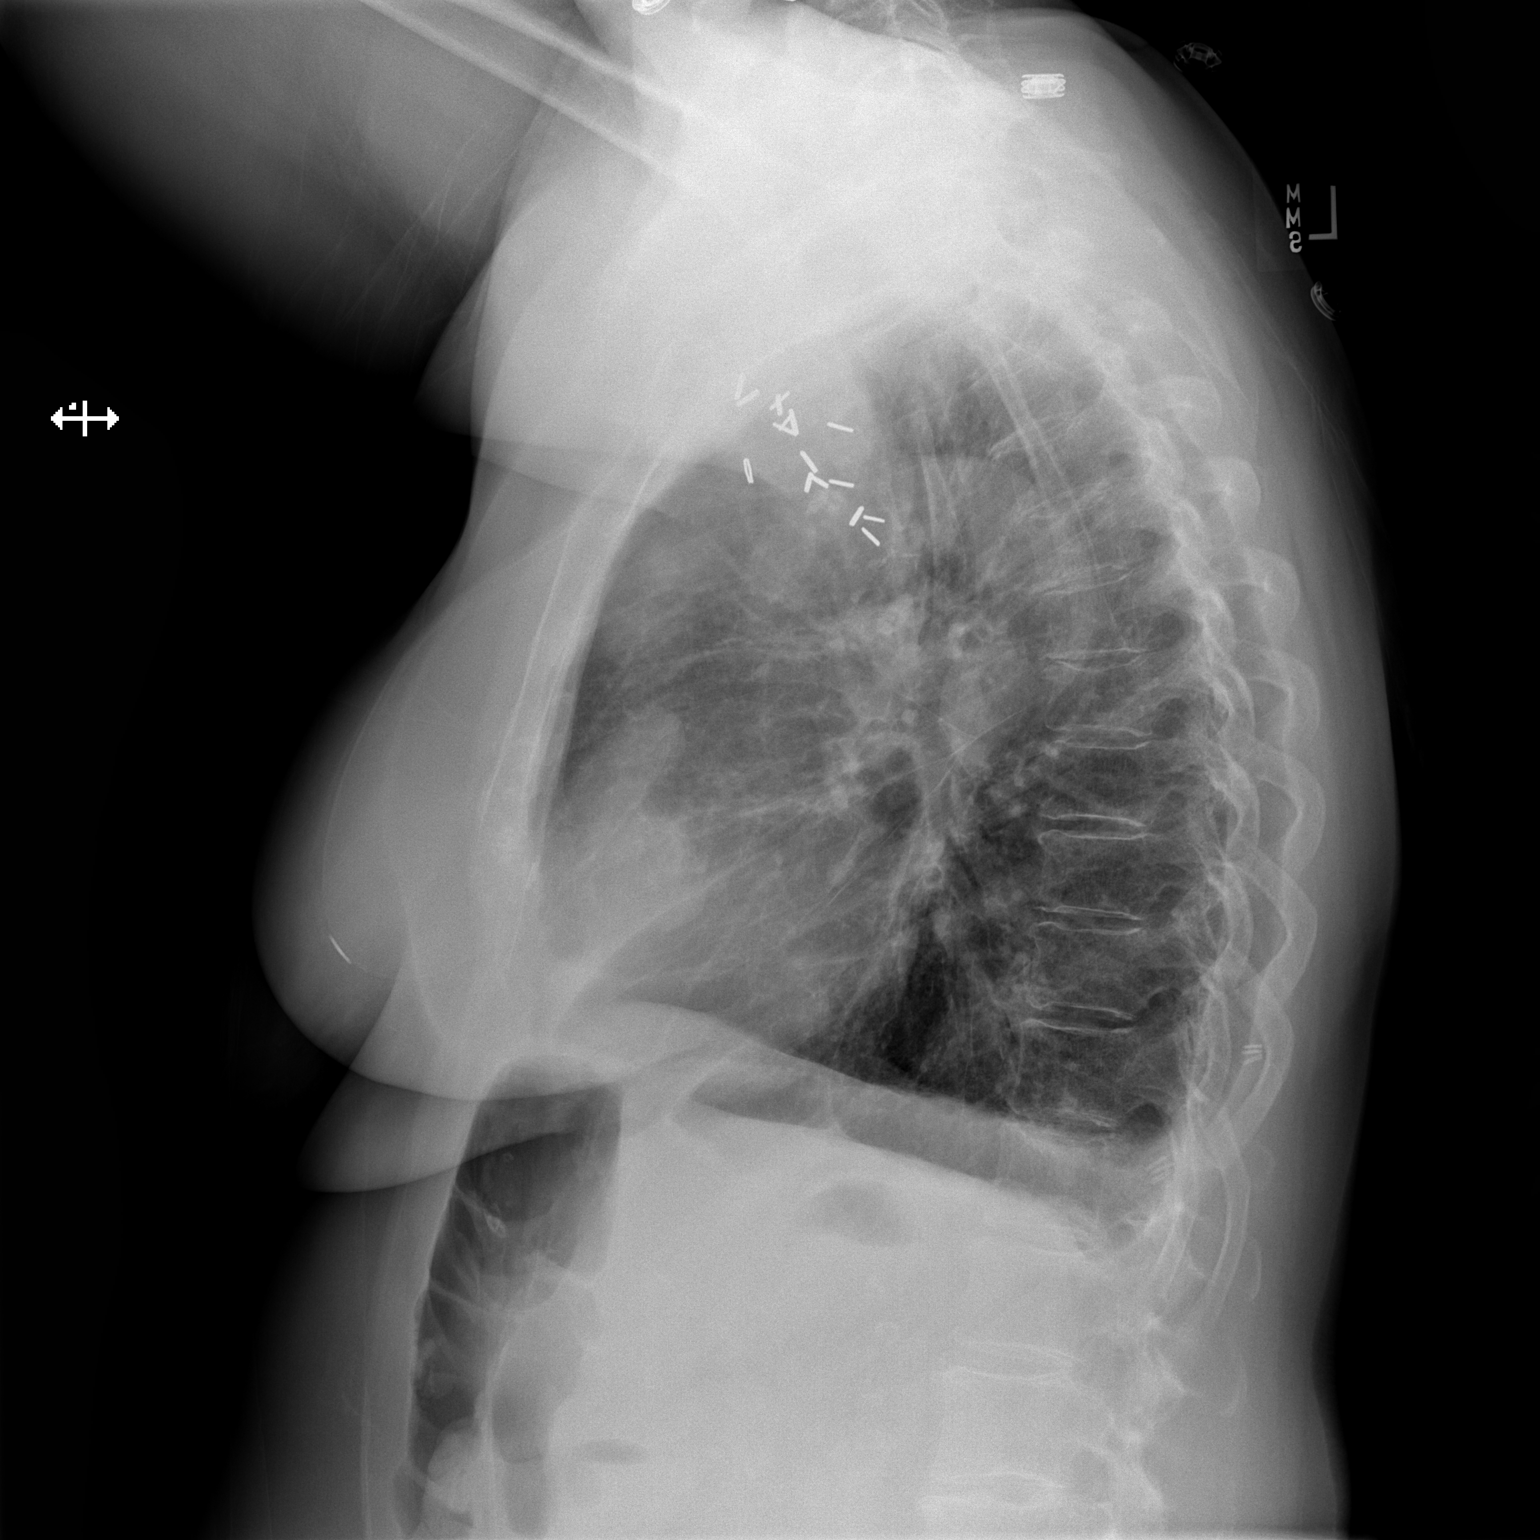

[2 of 2 positions shown; findings below may reference images not displayed]

FINDINGS: Confluent opacities present in the right middle lobe with an almost
masslike appearance on the lateral radiograph though may be
accentuated by overlying breast prostheses. However, there is
additional conspicuous upper mediastinal widening with thickening
along the right paratracheal stripe and a convex appearance of the
left mediastinal border extending into the AP window as well as
conspicuous perihilar fullness on the lateral view which raises
concern for adenopathy. Some coarsened interstitial and bronchitic
changes are present more diffusely. Blunting of the costophrenic
sulci may be related to some hyperinflation flattening of the
diaphragms versus trace effusions. No pneumothorax. No acute osseous
or soft tissue abnormality. Degenerative changes are present in the
imaged spine and shoulders. Surgical clips in the right axilla.
IMPRESSION: 1. Confluent opacities in the right middle lobe with an almost
masslike appearance on the lateral radiograph though may be
accentuated by overlying breast prostheses. While infection is not
excluded, the presence of additional conspicuous mediastinal and
perihilar contours is worrisome for malignancy and metastatic
disease. Recommend further evaluation with contrast enhanced chest
CT.

These results will be called to the ordering clinician or
representative by the Radiologist Assistant, and communication
documented in the PACS or [REDACTED].

## 2022-03-06 IMAGING — PT NM PET TUM IMG INITIAL (PI) SKULL BASE T - THIGH
7 series · 25 of 25 positions shown · non-contrast
Comparison: 10/26/2020

CLINICAL DATA: Subsequent treatment strategy for breast carcinoma.
Pulmonary mass. RIGHT breast carcinoma with mastectomy 9182.

EXAM:
NUCLEAR MEDICINE PET SKULL BASE TO THIGH
TECHNIQUE: 6.9 mCi F-18 FDG was injected intravenously. Full-ring PET imaging
was performed from the skull base to thigh after the radiotracer. CT
data was obtained and used for attenuation correction and anatomic
localization.
Fasting blood glucose: 95 mg/dl

[Series 3: pet sk_thigh ac · axial · 5.0mm · 4.07mm/px · z∈[-1033,-237]mm · 5 of 200 slices shown]
[im 1/200]
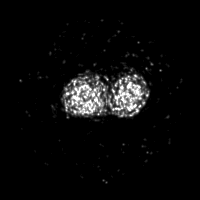
[im 50/200]
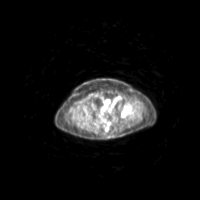
[im 100/200]
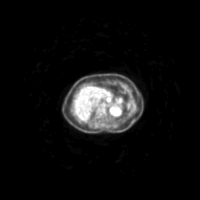
[im 150/200]
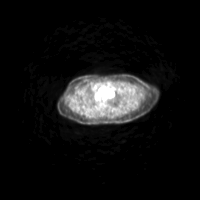
[im 200/200]
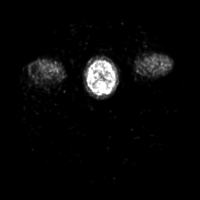

[Series 4: ct sk_thigh 5.0 bf37 · axial · 5.0mm · 0.98mm/px · z∈[-1033,-237]mm · 5 of 200 slices shown]
[im 1/200]
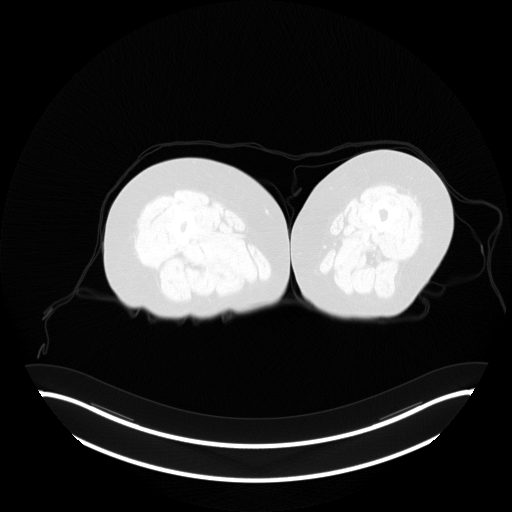
[im 50/200]
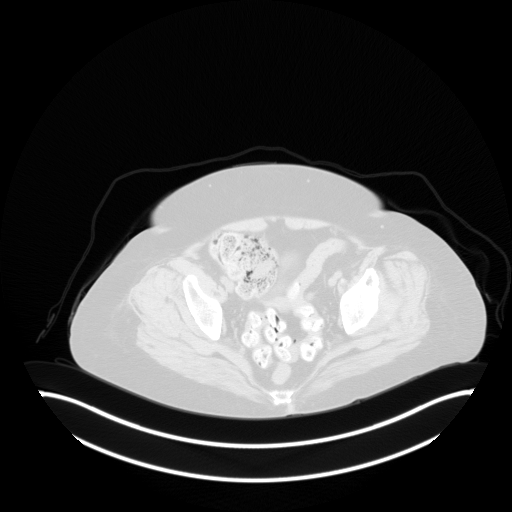
[im 100/200]
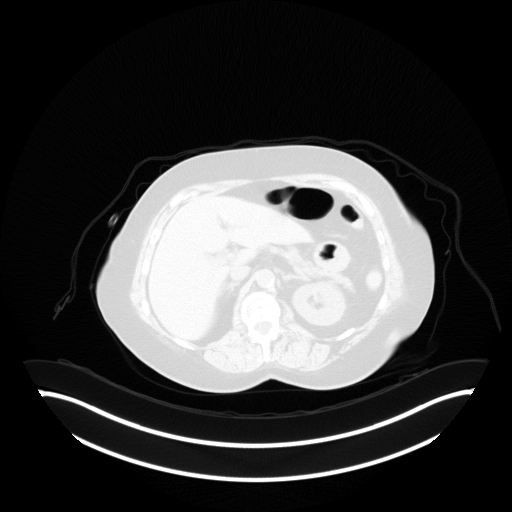
[im 150/200]
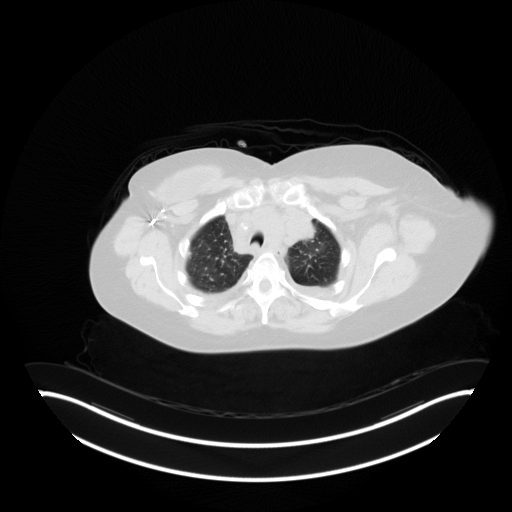
[im 200/200  brain]
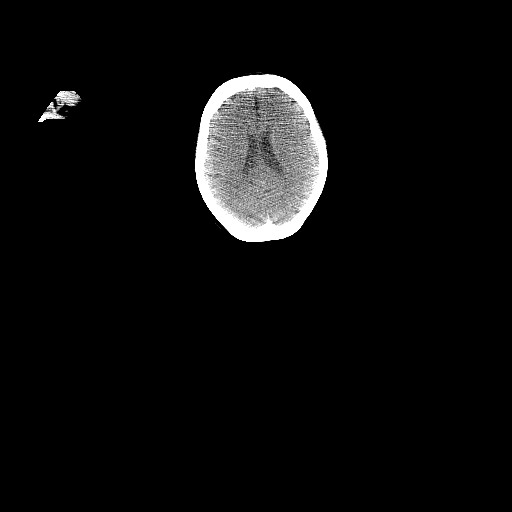

[Series 5: pet sk_thigh nac · axial · 5.0mm · 4.07mm/px · z∈[-1033,-237]mm · 5 of 200 slices shown]
[im 1/200]
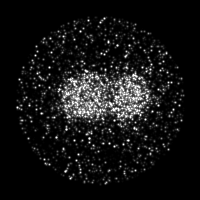
[im 50/200]
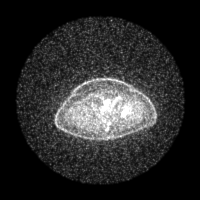
[im 100/200]
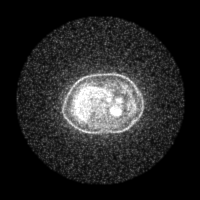
[im 150/200]
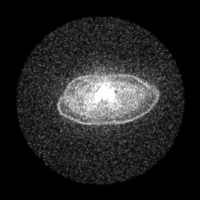
[im 200/200]
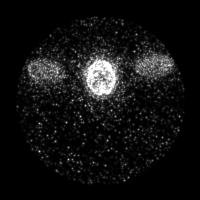

[Series 8: ct sk_thigh 5.0 br59 lung_bone · axial · 5.0mm · 0.65mm/px · z∈[-638,-374]mm · 2 of 67 slices shown]
[im 1/67]
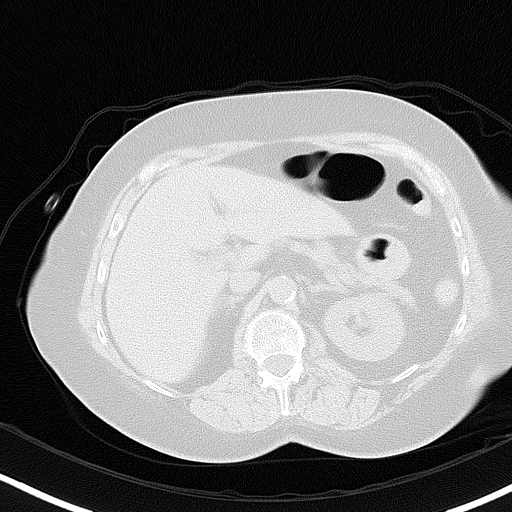
[im 67/67]
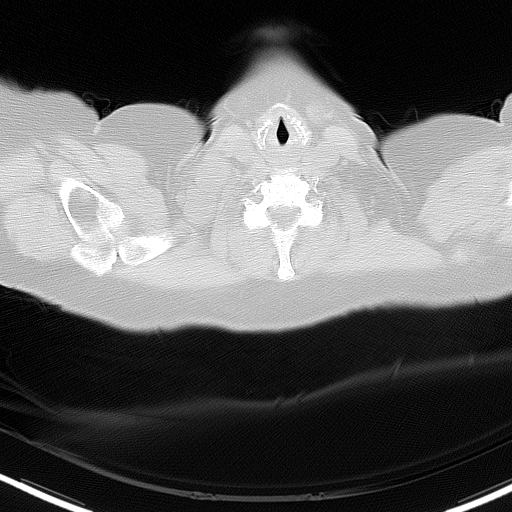

[Series 603: <mip collection> · coronal · 1.68mm/px · 1 of 32 slices shown]
[im 1/32]
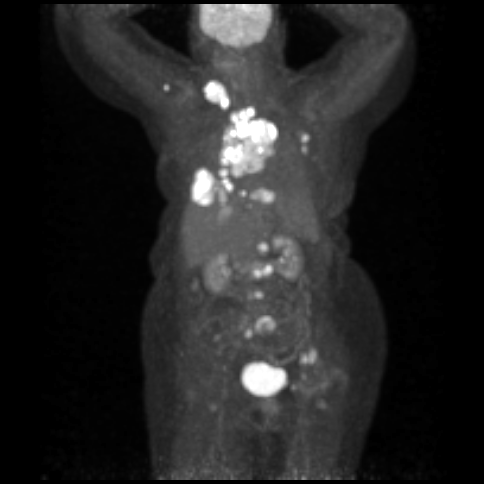

[Series 604: fused cor · 2 of 71 slices shown]
[im 1/71]
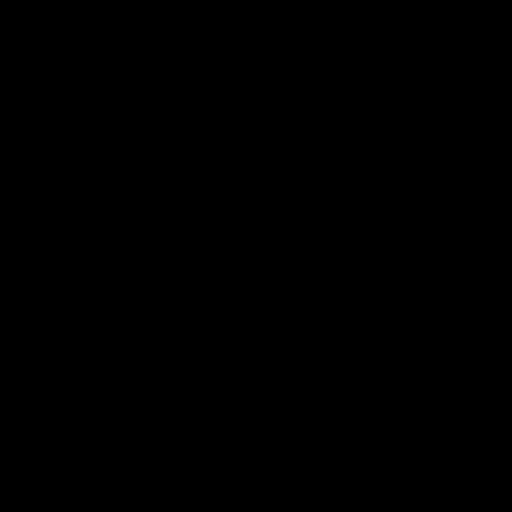
[im 71/71]
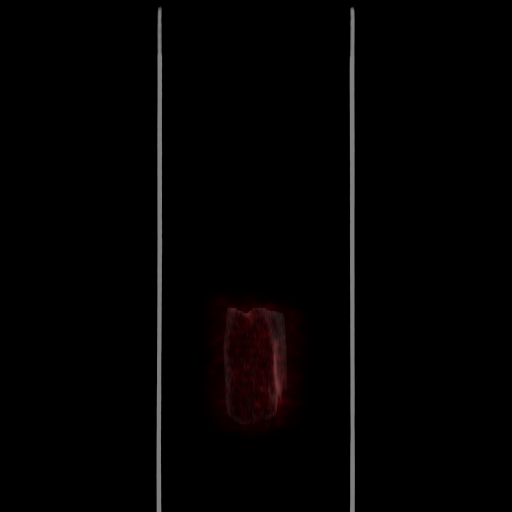

[Series 605: range-ct sk_thigh 5.0 bf37-tra-<alpha range> · 5 of 188 slices shown]
[im 1/188]
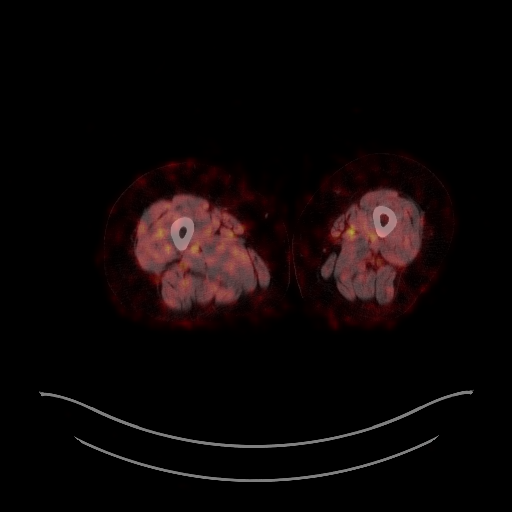
[im 47/188]
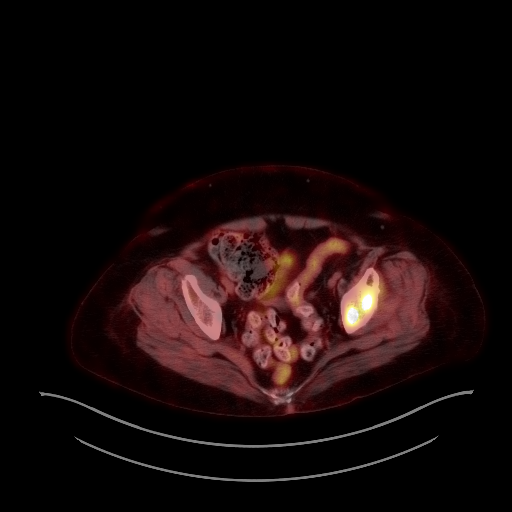
[im 94/188]
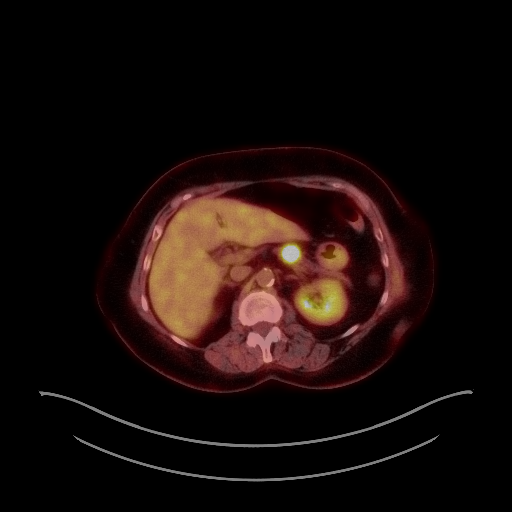
[im 141/188]
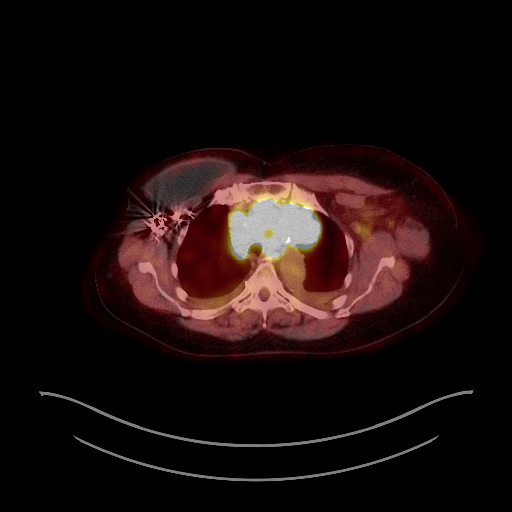
[im 188/188]
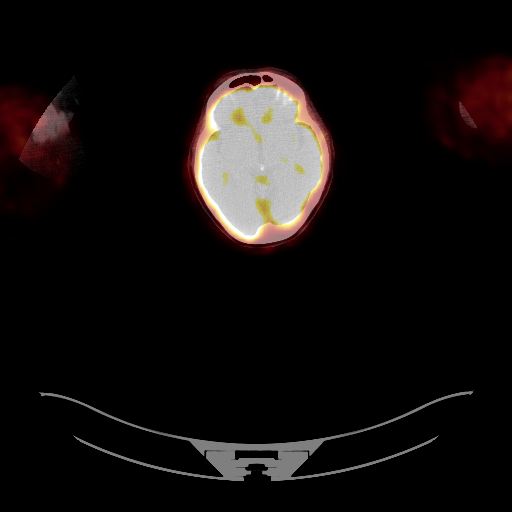

[25 of 25 positions shown; findings below may reference images not displayed]

FINDINGS: Mediastinal blood pool activity: SUV max

Liver activity: SUV max NA

NECK: Intensely hypermetabolic enlarged RIGHT supraclavicular nodes.
Example 2.4 cm node on image 37 with SUV max equal 37.1.

Incidental CT findings: none

CHEST: Intensely hypermetabolic nodal mass in the prevascular space
measures 3.5 cm with SUV max 35.9. There is extensive paratracheal
subcarinal adenopathy with equal intense metabolic activity.
Adenopathy extends into the RIGHT hilar nodes with SUV max equal
18.9.

Hypermetabolic LEFT axillary lymph nodes additionally

Along the anterior aspect of the RIGHT upper lobe there is a large
mass along the pleural surface measuring 4.2 x 1.8 cm with SUV max
equal 32. There several adjacent of pulmonary nodules also with
intense metabolic activity

Incidental CT findings: Bilateral layering moderate pleural
effusions. Post RIGHT mastectomy with breast prosthetic.

ABDOMEN/PELVIS: Solitary hypermetabolic lesion in the posterior
RIGHT hepatic lobe measures 1.6 cm with SUV max 6.3.

Hypermetabolic mass centrally within pancreas measuring 1.8 cm with
SUV max equal 13.2 on image 103.

Incidental CT findings: none

SKELETON: Multifocal skeletal metastasis within the pelvis, spine
and RIGHT humerus. Example lesion in the central sacrum with SUV max
equal 12. Minimal CT change at this level. Intense metabolic
activity at L3 with SUV max equal 15. Activity occupies the near
entirety of the vertebral body with minimal CT change. A focal
lesion in the RIGHT proximal humerus is subtly seen within the
marrow space on image 35).

Incidental CT findings: none
IMPRESSION: 1. Extensive hypermetabolic multi organ metastatic disease.
2. Bulky intensely hypermetabolic RIGHT supraclavicular, mediastinal
and LEFT axillary adenopathy.
3. Hypermetabolic pulmonary mass in the RIGHT upper lobe with
additional hypermetabolic nodules.
4. Hypermetabolic solitary hepatic metastasis.
5. Hypermetabolic lesion within the pancreas is also favored
metastatic lesion.
6. Multifocal hypermetabolic skeletal metastasis.
7. Bilateral pleural effusions.

## 2022-03-09 IMAGING — CR DG CHEST 1V
1 series · 1 of 1 positions shown · non-contrast
Comparison: November 08, 2020.

CLINICAL DATA: Shortness of breath.  Status post thoracentesis.

EXAM:
CHEST  1 VIEW

[chest ap]
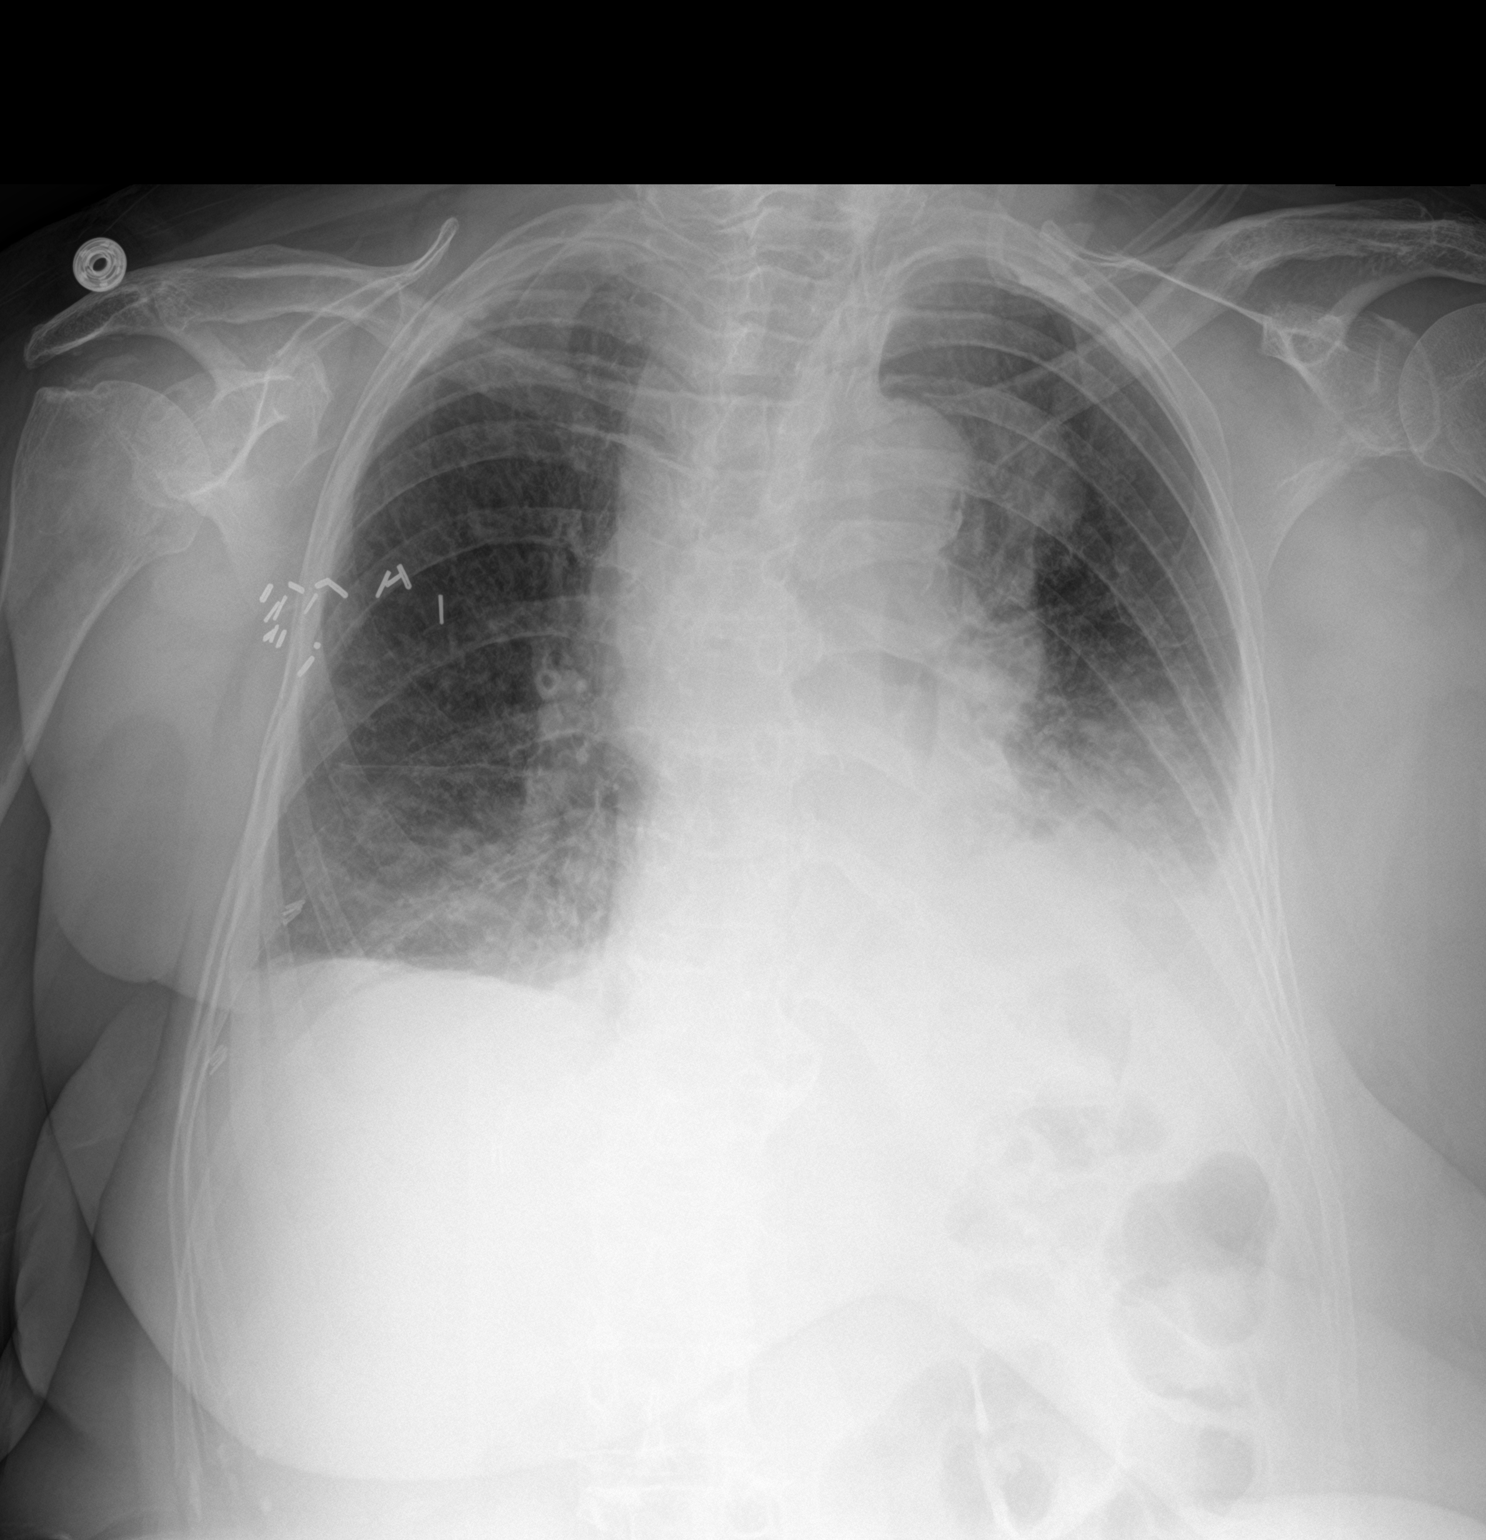

[1 of 1 positions shown; findings below may reference images not displayed]

FINDINGS: Stable cardiomediastinal silhouette. Mild left pleural effusion is
noted with increased left basilar opacity concerning for worsening
atelectasis or infiltrate. No significant pleural effusion is noted
on the right. Right basilar atelectasis is noted. No pneumothorax is
noted. Bony thorax is unremarkable.
IMPRESSION: Mild left pleural effusion is noted with increased left basilar
opacity concerning for worsening atelectasis or infiltrate. No
pneumothorax is noted. Right basilar atelectasis is noted.
